# Patient Record
Sex: Female | Born: 1935
Health system: Southern US, Community
[De-identification: ages and names within clinical notes are randomized; demographics above are authoritative.]

## PROBLEM LIST (undated history)

## (undated) DIAGNOSIS — R232 Flushing: Secondary | ICD-10-CM

## (undated) DIAGNOSIS — J189 Pneumonia, unspecified organism: Secondary | ICD-10-CM

## (undated) DIAGNOSIS — Z9289 Personal history of other medical treatment: Secondary | ICD-10-CM

## (undated) DIAGNOSIS — G8929 Other chronic pain: Secondary | ICD-10-CM

## (undated) DIAGNOSIS — R011 Cardiac murmur, unspecified: Secondary | ICD-10-CM

## (undated) DIAGNOSIS — B019 Varicella without complication: Secondary | ICD-10-CM

## (undated) DIAGNOSIS — I5030 Unspecified diastolic (congestive) heart failure: Secondary | ICD-10-CM

## (undated) DIAGNOSIS — M545 Low back pain: Secondary | ICD-10-CM

## (undated) DIAGNOSIS — E049 Nontoxic goiter, unspecified: Secondary | ICD-10-CM

## (undated) DIAGNOSIS — I509 Heart failure, unspecified: Secondary | ICD-10-CM

## (undated) DIAGNOSIS — N952 Postmenopausal atrophic vaginitis: Secondary | ICD-10-CM

## (undated) DIAGNOSIS — G5600 Carpal tunnel syndrome, unspecified upper limb: Secondary | ICD-10-CM

## (undated) DIAGNOSIS — E669 Obesity, unspecified: Secondary | ICD-10-CM

## (undated) DIAGNOSIS — M1990 Unspecified osteoarthritis, unspecified site: Secondary | ICD-10-CM

## (undated) DIAGNOSIS — E785 Hyperlipidemia, unspecified: Secondary | ICD-10-CM

## (undated) DIAGNOSIS — E213 Hyperparathyroidism, unspecified: Secondary | ICD-10-CM

## (undated) DIAGNOSIS — N1832 Chronic kidney disease, stage 3b: Secondary | ICD-10-CM

## (undated) DIAGNOSIS — I10 Essential (primary) hypertension: Secondary | ICD-10-CM

## (undated) DIAGNOSIS — N950 Postmenopausal bleeding: Secondary | ICD-10-CM

## (undated) DIAGNOSIS — I350 Nonrheumatic aortic (valve) stenosis: Secondary | ICD-10-CM

## (undated) DIAGNOSIS — D649 Anemia, unspecified: Secondary | ICD-10-CM

## (undated) DIAGNOSIS — I358 Other nonrheumatic aortic valve disorders: Secondary | ICD-10-CM

## (undated) DIAGNOSIS — G629 Polyneuropathy, unspecified: Secondary | ICD-10-CM

## (undated) DIAGNOSIS — I341 Nonrheumatic mitral (valve) prolapse: Secondary | ICD-10-CM

## (undated) DIAGNOSIS — N289 Disorder of kidney and ureter, unspecified: Secondary | ICD-10-CM

## (undated) HISTORY — DX: Personal history of other medical treatment: Z92.89

## (undated) HISTORY — DX: Essential (primary) hypertension: I10

## (undated) HISTORY — DX: Postmenopausal bleeding: N95.0

## (undated) HISTORY — PX: KNEE SURGERY: SHX244

## (undated) HISTORY — DX: Carpal tunnel syndrome, unspecified upper limb: G56.00

## (undated) HISTORY — DX: Cardiac murmur, unspecified: R01.1

## (undated) HISTORY — DX: Hyperparathyroidism, unspecified: E21.3

## (undated) HISTORY — DX: Disorder of kidney and ureter, unspecified: N28.9

## (undated) HISTORY — DX: Postmenopausal atrophic vaginitis: N95.2

## (undated) HISTORY — DX: Flushing: R23.2

## (undated) HISTORY — PX: COLONOSCOPY: SHX174

## (undated) HISTORY — DX: Varicella without complication: B01.9

## (undated) HISTORY — DX: Nontoxic goiter, unspecified: E04.9

## (undated) HISTORY — DX: Unspecified osteoarthritis, unspecified site: M19.90

## (undated) HISTORY — DX: Other nonrheumatic aortic valve disorders: I35.8

## (undated) HISTORY — DX: Other chronic pain: G89.29

## (undated) HISTORY — DX: Low back pain: M54.5

## (undated) HISTORY — PX: SHOULDER SURGERY: SHX246

## (undated) HISTORY — DX: Hyperlipidemia, unspecified: E78.5

## (undated) HISTORY — DX: Obesity, unspecified: E66.9

## (undated) HISTORY — DX: Nonrheumatic mitral (valve) prolapse: I34.1

---

## 1973-11-23 HISTORY — PX: DILATION AND CURETTAGE OF UTERUS: SHX78

## 1974-11-23 HISTORY — PX: TUBAL LIGATION: SHX77

## 1989-11-23 HISTORY — PX: CHOLECYSTECTOMY: SHX55

## 1995-11-24 HISTORY — PX: HEMORRHOID SURGERY: SHX153

## 1998-11-23 HISTORY — PX: REPLACEMENT TOTAL KNEE: SUR1224

## 2000-11-17 ENCOUNTER — Encounter: Payer: Self-pay | Admitting: Family Medicine

## 2000-11-17 ENCOUNTER — Ambulatory Visit (HOSPITAL_COMMUNITY): Admission: RE | Admit: 2000-11-17 | Discharge: 2000-11-17 | Payer: Self-pay | Admitting: Family Medicine

## 2000-12-20 ENCOUNTER — Encounter: Admission: RE | Admit: 2000-12-20 | Discharge: 2001-01-14 | Payer: Self-pay | Admitting: Neurosurgery

## 2000-12-21 ENCOUNTER — Other Ambulatory Visit: Admission: RE | Admit: 2000-12-21 | Discharge: 2000-12-21 | Payer: Self-pay | Admitting: Obstetrics and Gynecology

## 2001-01-03 ENCOUNTER — Ambulatory Visit (HOSPITAL_COMMUNITY): Admission: RE | Admit: 2001-01-03 | Discharge: 2001-01-03 | Payer: Self-pay | Admitting: Obstetrics and Gynecology

## 2001-01-03 ENCOUNTER — Encounter: Payer: Self-pay | Admitting: Obstetrics and Gynecology

## 2001-07-13 ENCOUNTER — Encounter: Admission: RE | Admit: 2001-07-13 | Discharge: 2001-10-11 | Payer: Self-pay | Admitting: Internal Medicine

## 2001-09-07 DIAGNOSIS — E119 Type 2 diabetes mellitus without complications: Secondary | ICD-10-CM | POA: Insufficient documentation

## 2002-01-12 ENCOUNTER — Other Ambulatory Visit: Admission: RE | Admit: 2002-01-12 | Discharge: 2002-01-12 | Payer: Self-pay | Admitting: Obstetrics and Gynecology

## 2002-01-12 DIAGNOSIS — N952 Postmenopausal atrophic vaginitis: Secondary | ICD-10-CM | POA: Insufficient documentation

## 2002-01-17 ENCOUNTER — Encounter: Payer: Self-pay | Admitting: Obstetrics and Gynecology

## 2002-01-17 ENCOUNTER — Ambulatory Visit (HOSPITAL_COMMUNITY): Admission: RE | Admit: 2002-01-17 | Discharge: 2002-01-17 | Payer: Self-pay | Admitting: Obstetrics and Gynecology

## 2002-02-01 ENCOUNTER — Encounter: Admission: RE | Admit: 2002-02-01 | Discharge: 2002-05-02 | Payer: Self-pay | Admitting: Internal Medicine

## 2002-05-31 ENCOUNTER — Encounter: Admission: RE | Admit: 2002-05-31 | Discharge: 2002-08-29 | Payer: Self-pay | Admitting: Internal Medicine

## 2002-10-30 ENCOUNTER — Encounter: Admission: RE | Admit: 2002-10-30 | Discharge: 2003-01-28 | Payer: Self-pay | Admitting: Neurosurgery

## 2002-12-13 ENCOUNTER — Encounter: Admission: RE | Admit: 2002-12-13 | Discharge: 2003-03-13 | Payer: Self-pay | Admitting: Internal Medicine

## 2003-01-31 ENCOUNTER — Ambulatory Visit (HOSPITAL_COMMUNITY): Admission: RE | Admit: 2003-01-31 | Discharge: 2003-01-31 | Payer: Self-pay | Admitting: Obstetrics and Gynecology

## 2003-01-31 ENCOUNTER — Encounter: Payer: Self-pay | Admitting: Obstetrics and Gynecology

## 2003-07-09 ENCOUNTER — Encounter: Admission: RE | Admit: 2003-07-09 | Discharge: 2003-09-13 | Payer: Self-pay | Admitting: Internal Medicine

## 2003-09-12 ENCOUNTER — Encounter (INDEPENDENT_AMBULATORY_CARE_PROVIDER_SITE_OTHER): Payer: Self-pay | Admitting: Specialist

## 2003-09-12 ENCOUNTER — Ambulatory Visit (HOSPITAL_COMMUNITY): Admission: RE | Admit: 2003-09-12 | Discharge: 2003-09-12 | Payer: Self-pay | Admitting: Gastroenterology

## 2003-10-31 ENCOUNTER — Inpatient Hospital Stay (HOSPITAL_COMMUNITY): Admission: RE | Admit: 2003-10-31 | Discharge: 2003-11-02 | Payer: Self-pay | Admitting: Orthopedic Surgery

## 2004-02-27 ENCOUNTER — Ambulatory Visit (HOSPITAL_COMMUNITY): Admission: RE | Admit: 2004-02-27 | Discharge: 2004-02-27 | Payer: Self-pay | Admitting: Obstetrics and Gynecology

## 2005-03-16 ENCOUNTER — Other Ambulatory Visit: Admission: RE | Admit: 2005-03-16 | Discharge: 2005-03-16 | Payer: Self-pay | Admitting: Obstetrics and Gynecology

## 2005-03-30 ENCOUNTER — Ambulatory Visit (HOSPITAL_COMMUNITY): Admission: RE | Admit: 2005-03-30 | Discharge: 2005-03-30 | Payer: Self-pay | Admitting: Obstetrics and Gynecology

## 2006-04-14 ENCOUNTER — Ambulatory Visit (HOSPITAL_COMMUNITY): Admission: RE | Admit: 2006-04-14 | Discharge: 2006-04-14 | Payer: Self-pay | Admitting: Obstetrics and Gynecology

## 2006-05-05 ENCOUNTER — Encounter: Admission: RE | Admit: 2006-05-05 | Discharge: 2006-05-05 | Payer: Self-pay | Admitting: Obstetrics and Gynecology

## 2007-04-13 ENCOUNTER — Encounter: Admission: RE | Admit: 2007-04-13 | Discharge: 2007-04-13 | Payer: Self-pay | Admitting: Obstetrics and Gynecology

## 2008-04-13 ENCOUNTER — Encounter: Admission: RE | Admit: 2008-04-13 | Discharge: 2008-04-13 | Payer: Self-pay | Admitting: Obstetrics and Gynecology

## 2009-01-01 DIAGNOSIS — N950 Postmenopausal bleeding: Secondary | ICD-10-CM | POA: Insufficient documentation

## 2009-02-11 DIAGNOSIS — N840 Polyp of corpus uteri: Secondary | ICD-10-CM | POA: Insufficient documentation

## 2009-02-28 ENCOUNTER — Encounter (INDEPENDENT_AMBULATORY_CARE_PROVIDER_SITE_OTHER): Payer: Self-pay | Admitting: Obstetrics and Gynecology

## 2009-02-28 ENCOUNTER — Ambulatory Visit (HOSPITAL_COMMUNITY): Admission: RE | Admit: 2009-02-28 | Discharge: 2009-02-28 | Payer: Self-pay | Admitting: Obstetrics and Gynecology

## 2009-02-28 DIAGNOSIS — N950 Postmenopausal bleeding: Secondary | ICD-10-CM

## 2009-02-28 HISTORY — DX: Postmenopausal bleeding: N95.0

## 2009-04-17 ENCOUNTER — Encounter: Admission: RE | Admit: 2009-04-17 | Discharge: 2009-04-17 | Payer: Self-pay | Admitting: Obstetrics and Gynecology

## 2009-05-02 ENCOUNTER — Ambulatory Visit (HOSPITAL_COMMUNITY): Admission: RE | Admit: 2009-05-02 | Discharge: 2009-05-03 | Payer: Self-pay | Admitting: Orthopedic Surgery

## 2010-02-06 DIAGNOSIS — M1990 Unspecified osteoarthritis, unspecified site: Secondary | ICD-10-CM | POA: Insufficient documentation

## 2010-02-06 DIAGNOSIS — N951 Menopausal and female climacteric states: Secondary | ICD-10-CM | POA: Insufficient documentation

## 2010-04-18 ENCOUNTER — Encounter: Admission: RE | Admit: 2010-04-18 | Discharge: 2010-04-18 | Payer: Self-pay | Admitting: Obstetrics and Gynecology

## 2010-06-23 DIAGNOSIS — Z9289 Personal history of other medical treatment: Secondary | ICD-10-CM

## 2010-06-23 HISTORY — DX: Personal history of other medical treatment: Z92.89

## 2010-07-24 HISTORY — PX: TRANSTHORACIC ECHOCARDIOGRAM: SHX275

## 2010-10-27 ENCOUNTER — Inpatient Hospital Stay (HOSPITAL_COMMUNITY)
Admission: RE | Admit: 2010-10-27 | Discharge: 2010-10-30 | Payer: Self-pay | Source: Home / Self Care | Attending: Orthopedic Surgery | Admitting: Orthopedic Surgery

## 2011-02-02 LAB — CBC
HCT: 28.1 % — ABNORMAL LOW (ref 36.0–46.0)
Hemoglobin: 8.4 g/dL — ABNORMAL LOW (ref 12.0–15.0)
Hemoglobin: 9.8 g/dL — ABNORMAL LOW (ref 12.0–15.0)
MCH: 33.3 pg (ref 26.0–34.0)
MCH: 33.6 pg (ref 26.0–34.0)
MCHC: 34.4 g/dL (ref 30.0–36.0)
MCHC: 34.9 g/dL (ref 30.0–36.0)
MCHC: 35.1 g/dL (ref 30.0–36.0)
Platelets: 138 10*3/uL — ABNORMAL LOW (ref 150–400)
Platelets: 149 10*3/uL — ABNORMAL LOW (ref 150–400)
RBC: 2.58 MIL/uL — ABNORMAL LOW (ref 3.87–5.11)
RBC: 2.91 MIL/uL — ABNORMAL LOW (ref 3.87–5.11)
RDW: 12.5 % (ref 11.5–15.5)
WBC: 12.5 10*3/uL — ABNORMAL HIGH (ref 4.0–10.5)
WBC: 15.5 10*3/uL — ABNORMAL HIGH (ref 4.0–10.5)

## 2011-02-02 LAB — PROTIME-INR
INR: 1.39 (ref 0.00–1.49)
Prothrombin Time: 19.4 seconds — ABNORMAL HIGH (ref 11.6–15.2)

## 2011-02-02 LAB — GLUCOSE, CAPILLARY
Glucose-Capillary: 156 mg/dL — ABNORMAL HIGH (ref 70–99)
Glucose-Capillary: 159 mg/dL — ABNORMAL HIGH (ref 70–99)
Glucose-Capillary: 171 mg/dL — ABNORMAL HIGH (ref 70–99)
Glucose-Capillary: 172 mg/dL — ABNORMAL HIGH (ref 70–99)
Glucose-Capillary: 172 mg/dL — ABNORMAL HIGH (ref 70–99)
Glucose-Capillary: 173 mg/dL — ABNORMAL HIGH (ref 70–99)
Glucose-Capillary: 182 mg/dL — ABNORMAL HIGH (ref 70–99)
Glucose-Capillary: 192 mg/dL — ABNORMAL HIGH (ref 70–99)

## 2011-02-02 LAB — TYPE AND SCREEN
ABO/RH(D): A POS
Antibody Screen: NEGATIVE

## 2011-02-02 LAB — BASIC METABOLIC PANEL
BUN: 12 mg/dL (ref 6–23)
BUN: 14 mg/dL (ref 6–23)
BUN: 15 mg/dL (ref 6–23)
CO2: 25 mEq/L (ref 19–32)
Calcium: 9.1 mg/dL (ref 8.4–10.5)
Calcium: 9.2 mg/dL (ref 8.4–10.5)
Chloride: 103 mEq/L (ref 96–112)
Creatinine, Ser: 1.25 mg/dL — ABNORMAL HIGH (ref 0.4–1.2)
Creatinine, Ser: 1.26 mg/dL — ABNORMAL HIGH (ref 0.4–1.2)
GFR calc Af Amer: 57 mL/min — ABNORMAL LOW (ref >60)
GFR calc non Af Amer: 42 mL/min — ABNORMAL LOW (ref >60)
Glucose, Bld: 185 mg/dL — ABNORMAL HIGH (ref 70–99)
Potassium: 4.2 mEq/L (ref 3.5–5.1)

## 2011-02-02 LAB — ABO/RH: ABO/RH(D): A POS

## 2011-02-03 LAB — CBC
MCH: 33.8 pg (ref 26.0–34.0)
MCHC: 34.9 g/dL (ref 30.0–36.0)
MCV: 96.7 fL (ref 78.0–100.0)
Platelets: 236 10*3/uL (ref 150–400)
RDW: 12.3 % (ref 11.5–15.5)
WBC: 9.1 10*3/uL (ref 4.0–10.5)

## 2011-02-03 LAB — COMPREHENSIVE METABOLIC PANEL
Albumin: 4.1 g/dL (ref 3.5–5.2)
BUN: 24 mg/dL — ABNORMAL HIGH (ref 6–23)
Calcium: 10.2 mg/dL (ref 8.4–10.5)
Creatinine, Ser: 1.52 mg/dL — ABNORMAL HIGH (ref 0.4–1.2)
Potassium: 4.4 mEq/L (ref 3.5–5.1)
Total Protein: 8.1 g/dL (ref 6.0–8.3)

## 2011-02-03 LAB — URINALYSIS, ROUTINE W REFLEX MICROSCOPIC
Specific Gravity, Urine: 1.01 (ref 1.005–1.030)
Urobilinogen, UA: 0.2 mg/dL (ref 0.0–1.0)

## 2011-02-03 LAB — URINE MICROSCOPIC-ADD ON

## 2011-02-03 LAB — PROTIME-INR: INR: 0.95 (ref 0.00–1.49)

## 2011-02-03 LAB — SURGICAL PCR SCREEN: Staphylococcus aureus: NEGATIVE

## 2011-02-03 LAB — APTT: aPTT: 29 seconds (ref 24–37)

## 2011-03-02 LAB — URINALYSIS, ROUTINE W REFLEX MICROSCOPIC
Glucose, UA: NEGATIVE mg/dL
Hgb urine dipstick: NEGATIVE
Protein, ur: NEGATIVE mg/dL
pH: 5 (ref 5.0–8.0)

## 2011-03-02 LAB — PROTIME-INR: Prothrombin Time: 12.4 seconds (ref 11.6–15.2)

## 2011-03-02 LAB — GLUCOSE, CAPILLARY
Glucose-Capillary: 100 mg/dL — ABNORMAL HIGH (ref 70–99)
Glucose-Capillary: 108 mg/dL — ABNORMAL HIGH (ref 70–99)
Glucose-Capillary: 137 mg/dL — ABNORMAL HIGH (ref 70–99)
Glucose-Capillary: 138 mg/dL — ABNORMAL HIGH (ref 70–99)

## 2011-03-02 LAB — COMPREHENSIVE METABOLIC PANEL
ALT: 20 U/L (ref 0–35)
AST: 26 U/L (ref 0–37)
Alkaline Phosphatase: 49 U/L (ref 39–117)
CO2: 27 mEq/L (ref 19–32)
GFR calc Af Amer: 54 mL/min — ABNORMAL LOW (ref >60)
GFR calc non Af Amer: 45 mL/min — ABNORMAL LOW (ref >60)
Glucose, Bld: 94 mg/dL (ref 70–99)
Potassium: 4.3 mEq/L (ref 3.5–5.1)
Sodium: 140 mEq/L (ref 135–145)
Total Protein: 7.3 g/dL (ref 6.0–8.3)

## 2011-03-02 LAB — CBC
Hemoglobin: 13.2 g/dL (ref 12.0–15.0)
RBC: 3.87 MIL/uL (ref 3.87–5.11)
WBC: 10.1 10*3/uL (ref 4.0–10.5)

## 2011-03-04 LAB — CBC
HCT: 39.6 % (ref 36.0–46.0)
Platelets: 214 10*3/uL (ref 150–400)
RDW: 12.3 % (ref 11.5–15.5)

## 2011-03-04 LAB — BASIC METABOLIC PANEL
BUN: 19 mg/dL (ref 6–23)
CO2: 28 mEq/L (ref 19–32)
Chloride: 103 mEq/L (ref 96–112)
Creatinine, Ser: 1.19 mg/dL (ref 0.4–1.2)
Glucose, Bld: 99 mg/dL (ref 70–99)
Potassium: 4.3 mEq/L (ref 3.5–5.1)

## 2011-03-17 ENCOUNTER — Other Ambulatory Visit: Payer: Self-pay | Admitting: Obstetrics and Gynecology

## 2011-03-17 DIAGNOSIS — Z1231 Encounter for screening mammogram for malignant neoplasm of breast: Secondary | ICD-10-CM

## 2011-04-07 NOTE — Op Note (Signed)
NAMENEVEYAH, GARZON               ACCOUNT NO.:  [id number]   MEDICAL RECORD NO.:  [id number]          PATIENT TYPE:  AMB   LOCATION:  SDC                           FACILITY:  [facility]   PHYSICIAN:  Hal Morales, M.D.DATE OF BIRTH:  May 28, 1934   DATE OF PROCEDURE:  02/28/2009  DATE OF DISCHARGE:                               OPERATIVE REPORT   PREOPERATIVE DIAGNOSES:  Postmenopausal bleeding, endometrial polyp.   POSTOPERATIVE DIAGNOSES:  Postmenopausal bleeding, endometrial polyp.   OPERATION:  Hysteroscopy, dilation and curettage, removal of endometrial  polyp.   SURGEON:  Vanessa P. Haygood, MD   ANESTHESIA:  General orotracheal.   ESTIMATED BLOOD LOSS:  Less than 10 mL.   COMPLICATIONS:  None.   FINDINGS:  There was a 1-cm endometrial polyp on the posterior fundus of  the uterus near the tubal ostium.  The remainder of the endometrium was  atrophic.   PROCEDURE:  The patient was taken to the operating room after  appropriate identification and placed on the operating table.  After the  attainment of adequate general anesthesia, she was placed in the  lithotomy position.  The perineum and vagina were prepped with multiple  layers of Betadine and a red Robinson catheter used to empty the  bladder.  The perineum was draped as a sterile field.  A Graves speculum  was placed in the vagina and a paracervical block achieved with a total  of 10 mL of 2% Xylocaine in the 5 and 7 o'clock positions.  The cervix  was grasped with a tenaculum and the uterus sounded to 9 cm.  The cervix  was then dilated to accommodate the diagnostic hysteroscope and this was  used to evaluate the endometrial cavity with the above-noted findings.  These findings were documented and the Randall stone forceps used to  remove the 1 endometrial polyp that had been noted.  This was documented  as removed with hysteroscopy and the hysteroscope removed and the entire  cavity curetted.  The endometrial  curettings were removed from the  operative field and the hysteroscope inserted once again to document  that the entire uterine cavity was clear of any lesions.  All  instruments were then removed from the vagina and the patient awakened  from general anesthesia and taken to the recovery room in satisfactory  condition having tolerated the procedure well with sponge and instrument  counts correct.  Prior to emergence from anesthesia, the patient was  given Toradol 30 mg IV and 30 mg IM.   DISCHARGE INSTRUCTIONS:  Printed instructions for [facility] from the [facility].   DISCHARGE MEDICATIONS:  The patient's admission medications except she  is to take no aspirin until March 01, 2009.   FOLLOWUP:  The patient has a 2-week followup with Dr. Pennie Rushing.      Hal Morales, M.D.  Electronically Signed     VPH/MEDQ  D:  02/28/2009  T:  02/28/2009  Job:  [id number]

## 2011-04-07 NOTE — H&P (Signed)
NAMECLARKE, Bonnie Chavez               ACCOUNT NO.:  [id number]   MEDICAL RECORD NO.:  [id number]          PATIENT TYPE:  AMB   LOCATION:                                FACILITY:  [facility]   PHYSICIAN:  Hal Morales, M.D.DATE OF BIRTH:  1934-08-08   DATE OF ADMISSION:  02/28/2009  DATE OF DISCHARGE:                              HISTORY & PHYSICAL   HISTORY OF PRESENT ILLNESS:  The patient is a 77 year old black female  para 3-0-1-2 who presents for evaluation of postmenopausal bleeding.  The patient has had an episode of postmenopausal bleeding that occurred  in February without warning.  She had had no menses since 1996.  She had  a normal hemoglobin and TSH in January of 2010.  She had mild abdominal  pain with dull crampiness as well as some flatulence but no  constipation, diarrhea, rectal bleeding, vomiting, nausea.  She has used  CombiPatch for hormone replacement therapy for function since 2003.  She  has tried on several occasions to discontinue the CombiPatch but each  time has had disruptive vasomotor symptomatology.   She had evaluation of her postmenopausal bleeding with an endometrial  biopsy performed on January 02, 2009, showing scant fragments of benign  cuboidal epithelium consistent with endometrial lining.  She underwent a  transvaginal ultrasound to determine the endometrial thickness and this  was noted to be thickened with the possibility of two hyperechoic masses  measuring 0.5 cm in each.  The endometrial thickness was measured at 7.8  mm.  In light of these findings, it is recommended that the patient  undergo hysteroscopy D and C and removal of any endometrial polyps.   PAST MEDICAL HISTORY:  The patient has a history of mitral valve  prolapse but does not require antibiotic prophylaxis.  At this point she  also has chronic hypertension, hyperlipidemia and type 2 diabetes.   FAMILY HISTORY:  Is positive for cerebrovascular accident, chronic  hypertension,  diabetes and heart disease.   CURRENT MEDICATIONS:  1. Zestril 40 mg daily.  2. Norvasc 10 mg daily.  3. Furosemide 20 mg daily.  4. Zetia 10 mg daily.  5. Crestor 20 mg daily.  6. Januvia 100 mg daily.  7. CombiPatch 0.5 over 0.14 two times per week.  8. Bystolic 5 mg daily.  9. Vitamin D 1000 international units twice daily.  10.Tylenol Extra Strength 1000 mg twice daily.  11.Vitamin D 400 international units daily.  12.Caltrate 600 mg twice daily.  13.Multivitamins one p.o. daily.  14.CoQ10 100 mg twice daily.  15.Aspirin 81 mg daily.   DRUG ALLERGIES:  PENICILLIN.   REVIEW OF SYSTEMS:  Is positive for joint pain secondary to degenerative  joint disease and the aforementioned postmenopausal bleeding which has  not recurred.   PHYSICAL EXAMINATION:  GENERAL:  The patient is an obese black female in  no acute distress.  VITAL SIGNS:  Blood pressure is 110/60.  LUNGS:  Clear.  HEART:  Regular rate and rhythm.  ABDOMEN:  The abdomen is soft without masses or organomegaly.  PELVIC:  EG, BUS is within normal  limits.  The vagina is atrophic.  The  cervix is without gross lesions.  The uterus is upper limits of normal  size and sounds to 10 cm.  The adnexa without masses.  Rectovaginal no  masses.   IMPRESSION:  1. Postmenopausal bleeding, question secondary to hormone replacement      therapy.  2. Increased endometrial thickness with possible endometrial masses.  3. Chronic hypertension.  4. Hyperlipidemia.  5. Type 2 diabetes.   DISPOSITION:  A discussion was held with the patient with the  recommendation that she undergo hysteroscopy with resection of  endometrial masses and curettage.  The risks of anesthesia, bleeding,  infection, damage to adjacent organs and uterine perforation have been  reviewed with her and she wishes to proceed.  This will be done at  [facility] on February 28, 2009.      Hal Morales, M.D.  Electronically Signed     VPH/MEDQ  D:   02/27/2009  T:  02/27/2009  Job:  [id number]

## 2011-04-07 NOTE — Op Note (Signed)
Bonnie Chavez, Bonnie Chavez               ACCOUNT NO.:  [id number]   MEDICAL RECORD NO.:  [id number]          PATIENT TYPE:  OIB   LOCATION:  5034                         FACILITY:  [facility]   PHYSICIAN:  Vania Rea. Supple, M.D.  DATE OF BIRTH:  04-08-1934   DATE OF PROCEDURE:  05/02/2009  DATE OF DISCHARGE:                               OPERATIVE REPORT   PREOPERATIVE DIAGNOSIS:  End-stage right shoulder osteoarthrosis.   POSTOPERATIVE DIAGNOSIS:  End-stage right shoulder osteoarthrosis.   PROCEDURE:  A right total shoulder arthroplasty utilizing a size 10  Press-Fit DePuy global stem, a cemented 40 pegged glenoid and a 44 x 18  humeral head.   SURGEON:  Vania Rea. Supple, MD   ASSISTANT:  Lucita Lora. Shuford, PA-C.   ANESTHESIA:  General endotracheal as well as an interscalene block.   ESTIMATED BLOOD LOSS:  250 mL.   DRAINS:  None.   HISTORY:  [name] is a 77 year old female who has had chronic right  shoulder pain with weakness and restrictions in mobility and examination  showing a painful limited arc of motion with radiographs confirming end-  stage arthrosis.  Due to ongoing pain and functional limitations, she is  brought to the operative room at this time for planned right total  shoulder arthroplasty.   Preoperatively counseled [name] regarding treatment options as well  as risks versus benefits thereof.  Possible surgical complications  bleeding, infection, neurovascular injury, persistent loss of motion and  possible need for revision surgery, and potential for anesthetic  complication were reviewed.  She understands and accepts and agrees with  a planned procedure.   PROCEDURE IN DETAIL:  After undergoing routine preop evaluation, the  patient received prophylactic antibiotics and an interscalene block was  established in the holding area by the Anesthesia department.  Placed  supine on the op table and underwent smooth induction of a general  endotracheal  anesthesia.  Placed into a gentle beach-chair position and  appropriately padded and protected.  The right shoulder girdle region  was sterilely prepped and draped in standard fashion.  Time-out was  called.  Any anterior approach to the right shoulder was made through a  15 cm incision beginning at the coracoid process and extending laterally  and distally.  Skin flaps were elevated and  electrocautery was used for  hemostasis.  The deltopectoral interval was identified and developed and  then the cephalic vein was retracted laterally.  Deep dissection carried  down to the anterior shoulder with the conjoined tendon being retracted  medially and the upper margin of the pec major tendon tenotomized for 1  cm.  The bicipital groove was identified and this was opened and then  the subscapularis was elevated away from the lesser tuberosity with  electrocautery and the free margin of the subscapularis was then tacked  with a series of #2 FiberWire sutures.  Biceps tendon was tenotomized.  Proximal stump was removed.  The humeral head was then delivered through  the wound and the osteophytes removed from the anterior and inferior  aspects of the humeral head.  Capsule was  released inferiorly allowing  complete exposure of the humeral head.  We then used the extramedullary  guide to create the osteotomy of the humeral head to appropriate level  with care taken to protect the rotator cuff.  Hand reamings were then  used to gain access to the humeral medullary canal and reaming was  performed up to size 10.  We then performed sequential broachings up to  size 10 which showed good fit.  We then turned our attention to the  glenoid.  The labrum was removed circumferentially with combination of  sharp dissection and electrocautery.  Good exposure was achieved.  We  then performed reaming with the size 40 reamer since this had the most  appropriate coverage of the glenoid.  The stabilizing drill holes  were  then placed.  Trial reduction was performed showing excellent fit and  coverage.  We then meticulously cleaned the glenoid.  Cement was mixed  and introduced into the peripheral drill holes and then the implant was  impacted into position.  Following this the proximal humerus was again  delivered through the wound.  The trial implant was removed.  The final  implant was then placed into the humerus and we did supplement bone  graft from the resected humeral head to supplement the metaphyseal  portion of the bone.  The implant was then impacted into the proper  level obtaining excellent fit.  Trial reduction was performed and the 48  x 18 head had excellent fit.  There was good soft tissue balance and  appropriate 50% translation posteriorly across the glenoid.  A trial was  then removed.  The Morse taper was meticulously cleaned and then the  final 48 x 18 head was impacted into position.  Final reduction was  performed.  Excellent shoulder mobility and good stability was achieved.  We then repaired the subscapularis to the lesser tuberosity through a  series of bone tunnels using the #2 FiberWires.  The rotator interval  was then closed with a pair of figure-of-eight #2 FiberWire sutures.  We  then performed a biceps tenodesis with the FiberWire anteriorly.  Final  inspection and irrigation was completed.  I should mention that we did  remove a large osteochondral loose body from the subscapularis recess  prior to closure and we also performed a complete release of the  subscapularis so that it was fully mobilized prior to the repair to the  lesser tuberosity.  At the completion, hemostasis was obtained.  Final  irrigation was completed.  The deltopectoral interval was allowed to  close and was reapproximated with a single #2 FiberWire suture.  2-0  Vicryl was used to close the subcu layer and intracuticular 3-0 Monocryl  for the skin followed by Steri-Strips.  Dry dressing placed  over the  right shoulder and right arm was placed in a sling immobilizer.  The  patient was supine, extubated and taken to the recovery room in stable  addition.      Vania Rea. Supple, M.D.  Electronically Signed     Vania Rea. Supple, M.D.  Electronically Signed    KMS/MEDQ  D:  05/02/2009  T:  05/03/2009  Job:  [id number]

## 2011-04-10 NOTE — Op Note (Signed)
NAME:  Chavez, Bonnie                         ACCOUNT NO.:  [id number]   MEDICAL RECORD NO.:  [id number]                   PATIENT TYPE:  INP   LOCATION:  0479                                 [facility]ACILITY:  [facility]   PHYSICIAN:  Ollen Chavez, M.D.                 DATE OF BIRTH:  [birth year]   DATE OF PROCEDURE:  10/31/2003  DATE OF DISCHARGE:                                 OPERATIVE REPORT   PREOPERATIVE DIAGNOSIS:  Osteoarthritis, left shoulder.   POSTOPERATIVE DIAGNOSIS:  Osteoarthritis, left shoulder.   PROCEDURE:  Left shoulder hemiarthroplasty.   SURGEON:  Ollen Chavez, M.D.   ASSISTANT:  Clarene Reamer, P.A.-C.   ANESTHESIA:  General and interscalene.   ESTIMATED BLOOD LOSS:  300.   DRAINS:  None.   COMPLICATIONS:  None.   CONDITION:  Stable to recovery room.   BRIEF CLINICAL NOTE:  Bonnie Chavez is a 77 year old female with end-stage  arthritis of the left shoulder, collapse of the humeral head, and bone-on-  bone change and large osteophytes.  She has had pain refractory to  nonoperative management and presents now for left shoulder hemiarthroplasty.  We were considering total shoulder arthroplasty depending on the status of  the glenoid.   PROCEDURE IN DETAIL:  After the successful administration of interscalene  block and general anesthetic, the patient is placed on the operating table  with her knees flexed and trunk also flexed about 30 degrees.  The left  upper extremity and shoulder girdle are isolated from her trunk with plastic  drapes and prepped and draped in the usual sterile fashion.  Incision line  is drawn from the coracoid process down toward the humeral shaft.  The skin  is cut with a 10 blade through subcutaneous tissue to the level of the  deltoid fascia.  We found the deltopectoral interval and dissected the soft  tissue over the cephalic vein and then retracted the vein laterally with the  pectoralis muscle.  I then removed the superior one-third  of the pectoralis  tendon from the humerus in order to gain better exposure.  The clavipectoral  fascia is then dissected to reveal the conjoined tendon.  Short retractors  placed on the conjoined tendon so as to not go deep and disturb the  musculocutaneous nerve.  The subscapularis is then visualized and the  vertical incision made in the subscapularis, and the medial border of the  tendon is tagged with sutures.  We then dislocated the shoulder.  She had  severe flattening of the humeral head with large osteophyte formation  inferior and posterior.  I placed the template for the humeral prosthesis  and marked the osteotomy line.  We then externally rotated the arm  approximately 30-35 degrees and made the humeral cut to give about 30-35  degrees of retroversion.  We then removed all of the osteophytes off the  inferior border of the cut.  The  glenoid is then inspected and,  surprisingly, the glenoid surface does not have any significant wear.  There  was some chondromalacia, but there was still some cartilage present; thus,  it was felt that we were going to leave the glenoid alone.  The canal  reaming is then performed up to a size 10.  We were only able to broach up  to an 8, as the 10 would not go down all the way.  We then placed the size 8  global humeral component.  This was rotationally unstable, and thus I had to  cement it in.  We placed a size 2 cement restrictor at the appropriate depth  in the canal, irrigated the canal, mixed the cement, and then injected it  into the canal.  Then placed the size 2 global humeral stem in the  appropriate retroversion. Once the cement was fully hardened, we trialled  the head.  The diameter was 48 mm off the resected head.  We tried a 48 x 15  first, but it did not provide enough offset.  We then went with a 48 x 18  and this led to appropriate offset and tension.  We could translate her  posteriorly about 50%, inferiorly about 50%.  I took  out the trial and  placed the permanent 48 x 18 global head.  The shoulder was reduced and the  wound copiously irrigated with antibiotic solution.  The subscapularis was  then closed with the Ethibond suture.  Further irrigated and then closed the  deltopectoral fascia with 0 Vicryl, subcu with interrupted 2-0 Vicryl, and  subcuticular running 4-0 Monocryl.  The incision was cleaned and dried and  Steri-Strips and a bulky sterile dressing applied.  The patient then placed  in a shoulder immobilizer, awakened and transported to recovery in stable  condition.                                               Ollen Chavez, M.D.    FA/MEDQ  D:  10/31/2003  T:  11/01/2003  Job:  [id number]

## 2011-04-10 NOTE — H&P (Signed)
NAME:  Bonnie Chavez, Bonnie Chavez                         ACCOUNT NO.:  [id number]   MEDICAL RECORD NO.:  [id number]                   PATIENT TYPE:  INP   LOCATION:  NA                                   FACILITY:  [facility]   PHYSICIAN:  Ollen Gross, M.D.                 DATE OF BIRTH:  1934/02/14   DATE OF ADMISSION:  10/31/2003  DATE OF DISCHARGE:                                HISTORY & PHYSICAL   CHIEF COMPLAINT:  Left shoulder pain.   HISTORY OF PRESENT ILLNESS:  The patient is a 77 year old female who has had  ongoing left shoulder pain. She is a right-hand dominant female who has been  seen by Dr. Lequita Halt for osteoarthritis of the shoulder. She has had some  neck and knee problems in the past along with her left shoulder. She has  been evaluated, worked up, and x-rays in the office show that she has severe  end-stage osteoarthritis of the left shoulder with large inferior  glenohumeral osteophyte formation. She does have limited activity and she is  having a lot of pain at night. The patient has been limiting her motion and  function. She is seen in evaluation and felt that she has reached the point  where she could benefit from undergoing hemiarthroplasty versus total  shoulder arthroplasty of the left shoulder. The risks and benefits have been  discussed with the patient, and she elects to proceed with surgery.   ALLERGIES:  PENICILLIN causes rash and itching.   CURRENT MEDICATIONS:  1. Hydrochlorothiazide 25 mg.  2. Lisinopril 20 mg.  3. Actos 15 mg.  4. Zetia 10 mg.  5. Zocor 10 mg.  6. Aspirin 81 mg.  7. CombiPatch 0.05/0.14 mg twice weekly.  8. Ferrous sulfate 325.  9. Glucosamine and chondroitin.  10.      Caltrate Plus 600 D.  11.      Vitamin E 400 IU.  12.      Vitamin C 500 mg.  13.      Multivitamin.  14.      Folic acid.  15.      B12.   PAST MEDICAL HISTORY:  1. History of bronchitis.  2. History of pneumonia.  3. Hypertension.  4. Functional heart murmur.  5. Hypercholesterolemia.  6. Hemorrhoids.  7. History of anemia.  8. Noninsulin-dependent diabetes mellitus.  9. Postmenopausal.   PAST SURGICAL HISTORY:  1. Left knee replacement in April 2000.  2. Cholecystectomy in 1991.  3. Hemorrhoidectomy in 1977.  4. C-section x2 in 1972 and 1976.  5. Recently underwent an EGD and colonoscopy in October 2004.   SOCIAL HISTORY:  Divorced, retired, nonsmoker. No alcohol use. Has two  children. She lives in a one story apartment.   FAMILY HISTORY:  Father is deceased at age 36.  Mother living, age 27.  History of heart disease, hypertension, diabetes, and arthritis.   REVIEW OF  SYSTEMS:  GENERAL:  No fever, chills, or night sweats. NEUROLOGIC:  No seizure, syncope, or paralysis. RESPIRATORY:  Shortness of breath,  productive cough or hemoptysis. CARDIOVASCULAR: Chest pain, angina, and  orthopnea. GASTROINTESTINAL:  She does have some intermittent constipation.  GENITOURINARY:  No dysuria, hematuria, or discharge.  MUSCULOSKELETAL:  Pertinent to that of the left shoulder found in the history of present  illness.   PHYSICAL EXAMINATION:  VITAL SIGNS:  Pulse 76, respirations 14, blood  pressure 148/68.  GENERAL:  The patient is a 77 year old African American female who is well  nourished, well developed, and in no acute distress. She is alert, oriented,  and cooperative.  HEENT:  Normocephalic and atraumatic. Pupils are round and reactive.  NECK:  Supple.  CHEST:  Clear with the exception of some fine inspiratory crackles noted at  the left base; otherwise, clear.  HEART:  She does have a crescendo/decrescendo type systolic murmur. S1 and  S2 noted. No rubs, thrills, or palpitations.  ABDOMEN:  Soft, round abdomen, nontender palpation. Bowel sounds are  present.  RECTAL/BREASTS/GENITALIA:  Not done; refer to history of present illness.  EXTREMITIES:  At the left shoulder, the patient has only forward elevation  of about 100 degrees,  abduction of about 100 degrees, and external rotation  only 30 degrees. She does have marked crepitus noted with passive range of  motion of the shoulder and pain elicited on extreme flexion and abduction.   IMPRESSION:  1. Osteoarthritis of the left shoulder.  2. History of bronchitis.  3. History of pneumonia.  4. Hypertension.  5. Functional heart murmur.  6. Hypercholesterolemia.  7. Hemorrhoids.  8. History of anemia.  9. Noninsulin-dependent diabetes mellitus.  10.      Postmenopausal.   PLAN:  The patient will be admitted to [facility] and  will undergo left shoulder hemiarthroplasty versus a total shoulder  arthroplasty. The surgery will be performed by Dr. Ollen Gross. The  patient's medical physician is Dr. Dorothyann Peng. Dr. Allyne Gee will be  notified of the admission and will be consulted if needed for any medical  assistance with the patient throughout the hospital course.     Alexzandrew L. Julien Girt, P.A.-C            Ollen Gross, M.D.    ALP/MEDQ  D:  10/26/2003  T:  10/26/2003  Job:  [id number]   cc:   Candyce Churn. Allyne Gee, M.D.  [address]  [address]  [place]  [place] [address]  Fax: [phone]

## 2011-04-10 NOTE — Op Note (Signed)
NAME:  Bonnie Chavez, Bonnie Chavez                         ACCOUNT NO.:  [id number]   MEDICAL RECORD NO.:  [id number]                   PATIENT TYPE:  AMB   LOCATION:  ENDO                                 FACILITY:  [facility]   PHYSICIAN:  Anselmo Rod, M.D.               DATE OF BIRTH:  19-Mar-1934   DATE OF PROCEDURE:  09/12/2003  DATE OF DISCHARGE:                                 OPERATIVE REPORT   PROCEDURE:  Screening colonoscopy.   ENDOSCOPIST:  Anselmo Rod, M.D.   INSTRUMENT USED:  Olympus video colonoscope.   INDICATIONS FOR PROCEDURE:  Iron deficiency anemia in a 77 year old African-  American female.  Rule out colonic polyps, masses, etc.   PRE-PROCEDURE PREPARATION:  Informed consent was procured from the patient.  The patient fasted for eight hours prior to the procedure and prepped with a  bottle of magnesium citrate and a gallon of GoLYTELY the night prior to the  procedure.   PRE-PROCEDURE PHYSICAL:  VITAL SIGNS:  Stable.  NECK:  Supple.  CHEST:  Clear to auscultation.  CARDIOVASCULAR:  S1 and S2 are regular.  ABDOMEN:  Soft with normal bowel sounds.   DESCRIPTION OF PROCEDURE:  The patient was placed in the left lateral  decubitus position and sedated with Demerol and Versed for the  esophagogastroduodenoscopy.  No additional sedation was used for the  colonoscopy.  Once the patient was adequately sedated and maintained on low  flow oxygen and continuous cardiac monitoring the Olympus video colonoscope  was advanced from the rectum to the cecum with slight difficulty.  The  patient had a very tortuous colon.  The patient's position was changed from  the left lateral to the supine and the right lateral position which allowed  application of abdominal pressure to reach the cecal base. The appendical  orifice and ileocecal valve were visualized and photographed.  No masses,  polyps, erosions, ulcerations or diverticula were seen.  Multiple washings  were done.  Small  lesions could have been missed secondary to residual stool  in the colon.  The patient tolerated the procedure well without  complications.  Retroflexion in the rectum revealed small internal  hemorrhoids.   IMPRESSION:  1. Essentially normal colonoscopy to the cecum except for small internal     hemorrhoids seen on retroflexion.  2. Residual stool in the colon and very small lesions could have been     missed.  3. Very tortuous atonic colon.   RECOMMENDATIONS:  1. Continue high fiber diet with liberal fluid intake.  2.     Repeat colonoscopy screening in the next ten years unless the patient     develops any further symptoms in the interim.  3. Outpatient follow-up in the next two weeks for further recommendations.  Anselmo Rod, M.D.    JNM/MEDQ  D:  09/12/2003  T:  09/12/2003  Job:  [id number]   cc:   Candyce Churn. Allyne Gee, M.D.  [address]  [address]  [place]  [place] [address]  Fax: [phone]

## 2011-04-10 NOTE — Discharge Summary (Signed)
NAME:  Bonnie Chavez, Bonnie Chavez                         ACCOUNT NO.:  [id number]   MEDICAL RECORD NO.:  [id number]                   PATIENT TYPE:  INP   LOCATION:  0479                                 FACILITY:  [facility]   PHYSICIAN:  Ollen Gross, M.D.                 DATE OF BIRTH:  [birth year]   DATE OF ADMISSION:  10/31/2003  DATE OF DISCHARGE:  11/02/2003                                 DISCHARGE SUMMARY   ADMISSION DIAGNOSES:  1. Osteoarthritis, left shoulder.  2. History of bronchitis.  3. History of pneumonia.  4. Hypertension.  5. Functional heart murmur.  6. Hypercholesterolemia.  7. Hemorrhoids.  8. History of anemia.  9. Non-insulin-dependent diabetes mellitus.  10.      Postmenopausal.   DISCHARGE DIAGNOSES:  1. Osteoarthritis, left shoulder; status post left shoulder     hemiarthroplasty.  2. History of bronchitis.  3. History of pneumonia.  4. Hypertension.  5. Functional heart murmur.  6. Hypercholesterolemia.  7. Hemorrhoids.  8. History of anemia.  9. Non-insulin-dependent diabetes mellitus.  10.      Postmenopausal.   PROCEDURE:  Date of surgery, October 31, 2003, left shoulder  hemiarthroplasty. Surgeon was Dr. Homero Fellers Aluisio. Assistant was [employer].  Anesthesia was general with interscalene block. Blood loss  was 300 cc. No drains.   CONSULTS:  None.   BRIEF HISTORY:  Ms. Bonnie Chavez is a 77 year old female with end-stage arthritis  of the left shoulder. She has had collapse of the humeral head with bone-on-  bone changes. The pain has been refractory to nonoperative management. Now  presents for shoulder surgery.   LABORATORY DATA:  CBC on admission showed hemoglobin of 12.0, hematocrit  35.1, white cell count 10, red cell count 3.53. Differential within normal  limits. Postoperative H&H was 10.6 and 30.7. PT and PTT 12.7 and 32  respectively with INR of 0.9. Chemistry panel revealed minimally elevated  glucose of 130, elevated BUN of 36,  elevated calcium of 10.9. The remaining  chemistry panel all within normal limits.  Urinalysis did show trace  hemoglobin, rare epithelials, 0-2 red cells, 0-2 white blood cells, blood  group type A positive.  EKG dated October 24, 2003, revealed normal sinus  rhythm with minimal voltage criteria for LVH. No old tracing to compared.  Performed by Dr. Willa Rough. Chest x-ray dated October 24, 2003, revealed  dextroscoliosis of the lower thoracic spine. There was, otherwise, no  evidence of acute cardiopulmonary disease.   HOSPITAL COURSE:  The patient was admitted to [facility] and taken  to the OR on October 31, 2003, and underwent the above procedure. She  tolerated the procedure well and transferred to the recovery room on the  orthopedic floor for continued postoperative care. The patient was placed on  p.o. and IV analgesia for pain control following surgery. Home meds were  restarted. OT was consulted for ADLs  and pendulums. By postoperative day  one, the patient was doing much better. She had a sore throat.  Her pain was  greatly improved. PCA was discontinued. She started occupational therapy. By  the following day, November 02, 2003, she was doing quite well and  discharged home.   DISCHARGE MEDICATIONS AND PLAN:  Discharged home on November 02, 2003.  Diagnoses as above.   DIET:  As tolerated, diabetic diet, low cholesterol diet.   ACTIVITY:  Pendulum exercises for left upper extremity. ADLs as per OT,  maybe as tolerated. May start showering approximately four days after  surgery.   FOLLOWUP:  In one week.   DISPOSITION:  Home.   CONDITION ON DISCHARGE:  Improved.     Alexzandrew L. Julien Girt, P.A.              Ollen Gross, M.D.    ALP/MEDQ  D:  12/13/2003  T:  12/14/2003  Job:  [id number]

## 2011-04-10 NOTE — Op Note (Signed)
   NAME:  Bonnie Chavez, Bonnie Chavez                         ACCOUNT NO.:  [id number]   MEDICAL RECORD NO.:  [id number]                   PATIENT TYPE:  AMB   LOCATION:  ENDO                                 FACILITY:  [facility]   PHYSICIAN:  Anselmo Rod, M.D.               DATE OF BIRTH:  Mar 30, 1934   DATE OF PROCEDURE:  09/12/2003  DATE OF DISCHARGE:                                 OPERATIVE REPORT   PROCEDURE:  Esophagogastroduodenoscopy with biopsies.   ENDOSCOPIST:  Anselmo Rod, M.D.   INSTRUMENT USED:  Olympus video panendoscope.   INDICATIONS FOR PROCEDURE:  Iron deficiency anemia, in a 76 year old African-  American female.  Rule out peptic ulcer disease with acid gastritis, etc.   PRE-PROCEDURE PREPARATION:  An informed consent was procured from the  patient.  The patient was fasted for eight hours prior to the procedure.   PRE-PROCEDURE PHYSICAL EXAMINATION:  VITAL SIGNS:  Stable vital signs.  NECK:  Supple.  LUNGS:  Clear to auscultation.  HEART:  S1, S2 regular.  ABDOMEN:  Soft, with normal bowel sounds.   DESCRIPTION OF PROCEDURE:  The patient was placed in the left lateral  decubitus position and sedated with 70 mg of Demerol and 7 mg of Versed  intravenously.  Once the patient was adequately sedated and maintained on  low-flow oxygen and continuous cardiac monitoring, the Olympus video  panendoscope was advanced through the mouthpiece over the tongue, into the  esophagus under direct vision.  The entire esophagus appeared normal with no  evidence of rings, strictures, masses, esophagitis, or Barrett's mucosa.  The scope was then advanced into the stomach.  Retroflexion on the high  cardia revealed no abnormalities.  The proximal stomach and mid-body  appeared normal.  There was some antral gastritis.  Biopsies were done to  rule out the presence of H. Pylori by pathology.  The proximal small bowel  appeared normal.   IMPRESSION:  Normal esophagogastroduodenoscopy,  except for severe antral  gastritis.  Biopsies done for Helicobacter pylori.   RECOMMENDATIONS:  1. Await pathology results.  2. Treat with antibiotics if Helicobacter pylori present.  3.     PPI of choice.  4. Proceed with a colonoscopy at this time.  5. Avoid all nonsteroidals including aspirin until further orders.                                                 Anselmo Rod, M.D.    JNM/MEDQ  D:  09/12/2003  T:  09/12/2003  Job:  [id number]   cc:   Candyce Churn. Allyne Gee, M.D.  [address]  [address]  [place]  [place] [address]  Fax: [phone]

## 2011-04-23 ENCOUNTER — Ambulatory Visit
Admission: RE | Admit: 2011-04-23 | Discharge: 2011-04-23 | Disposition: A | Discharge status: Home / Self Care | Payer: Medicare Other | Source: Ambulatory Visit | Attending: Obstetrics and Gynecology | Admitting: Obstetrics and Gynecology

## 2011-04-23 DIAGNOSIS — Z1231 Encounter for screening mammogram for malignant neoplasm of breast: Secondary | ICD-10-CM

## 2011-11-27 ENCOUNTER — Other Ambulatory Visit: Payer: Self-pay | Admitting: Internal Medicine

## 2011-11-27 DIAGNOSIS — E049 Nontoxic goiter, unspecified: Secondary | ICD-10-CM

## 2011-12-01 DIAGNOSIS — E782 Mixed hyperlipidemia: Secondary | ICD-10-CM | POA: Diagnosis not present

## 2011-12-01 DIAGNOSIS — I119 Hypertensive heart disease without heart failure: Secondary | ICD-10-CM | POA: Diagnosis not present

## 2011-12-03 ENCOUNTER — Ambulatory Visit
Admission: RE | Admit: 2011-12-03 | Discharge: 2011-12-03 | Disposition: A | Discharge status: Home / Self Care | Payer: Medicare Other | Source: Ambulatory Visit | Attending: Internal Medicine | Admitting: Internal Medicine

## 2011-12-03 DIAGNOSIS — E042 Nontoxic multinodular goiter: Secondary | ICD-10-CM | POA: Diagnosis not present

## 2011-12-03 DIAGNOSIS — E049 Nontoxic goiter, unspecified: Secondary | ICD-10-CM

## 2011-12-17 DIAGNOSIS — R142 Eructation: Secondary | ICD-10-CM | POA: Diagnosis not present

## 2011-12-17 DIAGNOSIS — R143 Flatulence: Secondary | ICD-10-CM | POA: Diagnosis not present

## 2011-12-17 DIAGNOSIS — R141 Gas pain: Secondary | ICD-10-CM | POA: Diagnosis not present

## 2011-12-17 DIAGNOSIS — E669 Obesity, unspecified: Secondary | ICD-10-CM | POA: Diagnosis not present

## 2011-12-17 DIAGNOSIS — Z1211 Encounter for screening for malignant neoplasm of colon: Secondary | ICD-10-CM | POA: Diagnosis not present

## 2011-12-18 DIAGNOSIS — E119 Type 2 diabetes mellitus without complications: Secondary | ICD-10-CM | POA: Diagnosis not present

## 2011-12-18 DIAGNOSIS — I10 Essential (primary) hypertension: Secondary | ICD-10-CM | POA: Diagnosis not present

## 2011-12-18 DIAGNOSIS — I129 Hypertensive chronic kidney disease with stage 1 through stage 4 chronic kidney disease, or unspecified chronic kidney disease: Secondary | ICD-10-CM | POA: Diagnosis not present

## 2011-12-18 DIAGNOSIS — N183 Chronic kidney disease, stage 3 unspecified: Secondary | ICD-10-CM | POA: Diagnosis not present

## 2011-12-18 DIAGNOSIS — N390 Urinary tract infection, site not specified: Secondary | ICD-10-CM | POA: Diagnosis not present

## 2012-01-13 DIAGNOSIS — N951 Menopausal and female climacteric states: Secondary | ICD-10-CM

## 2012-01-13 DIAGNOSIS — E119 Type 2 diabetes mellitus without complications: Secondary | ICD-10-CM

## 2012-01-13 DIAGNOSIS — N952 Postmenopausal atrophic vaginitis: Secondary | ICD-10-CM

## 2012-01-13 DIAGNOSIS — N840 Polyp of corpus uteri: Secondary | ICD-10-CM

## 2012-01-13 DIAGNOSIS — M1990 Unspecified osteoarthritis, unspecified site: Secondary | ICD-10-CM

## 2012-01-13 DIAGNOSIS — N950 Postmenopausal bleeding: Secondary | ICD-10-CM

## 2012-01-18 DIAGNOSIS — Z79899 Other long term (current) drug therapy: Secondary | ICD-10-CM | POA: Diagnosis not present

## 2012-01-18 DIAGNOSIS — E1129 Type 2 diabetes mellitus with other diabetic kidney complication: Secondary | ICD-10-CM | POA: Diagnosis not present

## 2012-01-18 DIAGNOSIS — N183 Chronic kidney disease, stage 3 unspecified: Secondary | ICD-10-CM | POA: Diagnosis not present

## 2012-01-19 ENCOUNTER — Other Ambulatory Visit: Payer: Self-pay | Admitting: Endocrinology

## 2012-01-19 DIAGNOSIS — I10 Essential (primary) hypertension: Secondary | ICD-10-CM | POA: Diagnosis not present

## 2012-01-19 DIAGNOSIS — IMO0001 Reserved for inherently not codable concepts without codable children: Secondary | ICD-10-CM | POA: Diagnosis not present

## 2012-01-19 DIAGNOSIS — E049 Nontoxic goiter, unspecified: Secondary | ICD-10-CM | POA: Diagnosis not present

## 2012-01-19 DIAGNOSIS — E041 Nontoxic single thyroid nodule: Secondary | ICD-10-CM

## 2012-01-27 ENCOUNTER — Ambulatory Visit
Admission: RE | Admit: 2012-01-27 | Discharge: 2012-01-27 | Disposition: A | Discharge status: Home / Self Care | Payer: Medicare Other | Source: Ambulatory Visit | Attending: Endocrinology | Admitting: Endocrinology

## 2012-01-27 ENCOUNTER — Other Ambulatory Visit (HOSPITAL_COMMUNITY)
Admission: RE | Admit: 2012-01-27 | Discharge: 2012-01-27 | Disposition: A | Discharge status: Home / Self Care | Payer: Medicare Other | Source: Ambulatory Visit | Attending: Interventional Radiology | Admitting: Interventional Radiology

## 2012-01-27 DIAGNOSIS — E049 Nontoxic goiter, unspecified: Secondary | ICD-10-CM | POA: Insufficient documentation

## 2012-01-27 DIAGNOSIS — E041 Nontoxic single thyroid nodule: Secondary | ICD-10-CM

## 2012-02-08 DIAGNOSIS — Z1211 Encounter for screening for malignant neoplasm of colon: Secondary | ICD-10-CM | POA: Diagnosis not present

## 2012-02-08 DIAGNOSIS — D126 Benign neoplasm of colon, unspecified: Secondary | ICD-10-CM | POA: Diagnosis not present

## 2012-02-10 DIAGNOSIS — E1139 Type 2 diabetes mellitus with other diabetic ophthalmic complication: Secondary | ICD-10-CM | POA: Diagnosis not present

## 2012-02-24 ENCOUNTER — Ambulatory Visit (INDEPENDENT_AMBULATORY_CARE_PROVIDER_SITE_OTHER): Payer: Medicare Other | Admitting: Obstetrics and Gynecology

## 2012-02-24 DIAGNOSIS — N951 Menopausal and female climacteric states: Secondary | ICD-10-CM

## 2012-03-17 ENCOUNTER — Other Ambulatory Visit: Payer: Self-pay | Admitting: Obstetrics and Gynecology

## 2012-03-17 DIAGNOSIS — Z1231 Encounter for screening mammogram for malignant neoplasm of breast: Secondary | ICD-10-CM

## 2012-04-26 DIAGNOSIS — E213 Hyperparathyroidism, unspecified: Secondary | ICD-10-CM | POA: Diagnosis not present

## 2012-04-26 DIAGNOSIS — N183 Chronic kidney disease, stage 3 unspecified: Secondary | ICD-10-CM | POA: Diagnosis not present

## 2012-04-26 DIAGNOSIS — N2581 Secondary hyperparathyroidism of renal origin: Secondary | ICD-10-CM | POA: Diagnosis not present

## 2012-04-26 DIAGNOSIS — D649 Anemia, unspecified: Secondary | ICD-10-CM | POA: Diagnosis not present

## 2012-04-26 DIAGNOSIS — I10 Essential (primary) hypertension: Secondary | ICD-10-CM | POA: Diagnosis not present

## 2012-04-26 DIAGNOSIS — I129 Hypertensive chronic kidney disease with stage 1 through stage 4 chronic kidney disease, or unspecified chronic kidney disease: Secondary | ICD-10-CM | POA: Diagnosis not present

## 2012-04-26 DIAGNOSIS — E119 Type 2 diabetes mellitus without complications: Secondary | ICD-10-CM | POA: Diagnosis not present

## 2012-04-28 ENCOUNTER — Ambulatory Visit
Admission: RE | Admit: 2012-04-28 | Discharge: 2012-04-28 | Disposition: A | Discharge status: Home / Self Care | Payer: Medicare Other | Source: Ambulatory Visit | Attending: Obstetrics and Gynecology | Admitting: Obstetrics and Gynecology

## 2012-04-28 DIAGNOSIS — Z1231 Encounter for screening mammogram for malignant neoplasm of breast: Secondary | ICD-10-CM

## 2012-05-24 DIAGNOSIS — I120 Hypertensive chronic kidney disease with stage 5 chronic kidney disease or end stage renal disease: Secondary | ICD-10-CM | POA: Diagnosis not present

## 2012-06-13 ENCOUNTER — Ambulatory Visit (INDEPENDENT_AMBULATORY_CARE_PROVIDER_SITE_OTHER): Payer: Medicare Other | Admitting: Family

## 2012-06-13 ENCOUNTER — Encounter: Payer: Self-pay | Admitting: Family

## 2012-06-13 VITALS — BP 158/50 | HR 97 | Temp 98.6°F | Ht 64.0 in | Wt 196.0 lb

## 2012-06-13 DIAGNOSIS — E119 Type 2 diabetes mellitus without complications: Secondary | ICD-10-CM

## 2012-06-13 DIAGNOSIS — I440 Atrioventricular block, first degree: Secondary | ICD-10-CM | POA: Diagnosis not present

## 2012-06-13 DIAGNOSIS — I10 Essential (primary) hypertension: Secondary | ICD-10-CM | POA: Diagnosis not present

## 2012-06-13 DIAGNOSIS — E7800 Pure hypercholesterolemia, unspecified: Secondary | ICD-10-CM

## 2012-06-13 LAB — BASIC METABOLIC PANEL
Chloride: 102 mEq/L (ref 96–112)
GFR: 44.07 mL/min — ABNORMAL LOW (ref >60.00)
Potassium: 4.2 mEq/L (ref 3.5–5.1)
Sodium: 137 mEq/L (ref 135–145)

## 2012-06-13 LAB — CBC WITH DIFFERENTIAL/PLATELET
Basophils Relative: 0.5 % (ref 0.0–3.0)
Eosinophils Relative: 2.7 % (ref 0.0–5.0)
HCT: 37.5 % (ref 36.0–46.0)
Hemoglobin: 12.5 g/dL (ref 12.0–15.0)
Lymphs Abs: 1.4 10*3/uL (ref 0.7–4.0)
MCV: 99.7 fl (ref 78.0–100.0)
Monocytes Absolute: 0.7 10*3/uL (ref 0.1–1.0)
Monocytes Relative: 9.4 % (ref 3.0–12.0)
Neutro Abs: 5.5 10*3/uL (ref 1.4–7.7)
RBC: 3.77 Mil/uL — ABNORMAL LOW (ref 3.87–5.11)
WBC: 7.9 10*3/uL (ref 4.5–10.5)

## 2012-06-13 LAB — LIPID PANEL
Cholesterol: 146 mg/dL (ref 0–200)
LDL Cholesterol: 78 mg/dL (ref 0–99)
Total CHOL/HDL Ratio: 3
VLDL: 14 mg/dL (ref 0.0–40.0)

## 2012-06-13 MED ORDER — LINAGLIPTIN 5 MG PO TABS: 5.0000 mg | ORAL_TABLET | Freq: Every day | ORAL | Status: DC

## 2012-06-13 MED ORDER — LANCETS MISC: 1.0000 [IU] | Freq: Two times a day (BID) | Status: DC | PRN

## 2012-06-13 MED ORDER — GLUCOSE BLOOD VI STRP: ORAL_STRIP | Status: DC

## 2012-06-13 NOTE — Progress Notes (Signed)
Subjective:    Patient ID: Bonnie Chavez, female    DOB: 10-01-1934, 78 y.o.   MRN: [id number]  HPI 78 year old [place] American female, new patient to the practice is in to be established. She has a history of type 2 diabetes, hypertension, hyperlipidemia, and a first degree heart block. Her last hemoglobin A1c was in February and was 6.6. Her fasting blood sugars range from 129-150. She has a history of arthritis.  Last gynecological appointment was in April 2013, mammogram June 2013, colonoscopy March 2013. Annual eye exams. She participate in water aerobics three times a week.   Patient has concerns of tingling in her hands particularly at bedtime. The right hand is worse than the left. Occurs about once a week and has been going on for about a year off and on. She has a history of carpal tunnel syndrome. In the past, she's taken vitamin B6 that is helped.  Patient has complaints of a spasm in her right thigh that occurs about once a month over the last year. She applies cream to the area that helps to relieve the symptoms. Denies any muscle weakness.   Review of Systems  Constitutional: Negative.   HENT: Negative.   Eyes: Negative.   Respiratory: Negative.   Cardiovascular: Negative.   Gastrointestinal: Negative.   Genitourinary: Negative.   Musculoskeletal: Negative.   Skin: Negative.   Neurological: Negative.        C/o tingling in her hands at night.   Hematological: Negative.   Psychiatric/Behavioral: Negative.    Past Medical History  Diagnosis Date  . Hypertension   . Diabetes mellitus   . Migraine   . History of chicken pox   . History of measles, mumps, or rubella   . Hyperlipidemia   . Osteoarthritis   . PMB (postmenopausal bleeding) 02/28/2009    Resolved  . Vaginal atrophy   . Vitamin d deficiency   . Mitral valve prolapse     History   Social History  . Marital Status: Divorced    Spouse Name: N/A    Number of Children: N/A  . Years of Education: N/A    Occupational History  . Not on file.   Social History Main Topics  . Smoking status: Never Smoker   . Smokeless tobacco: Not on file  . Alcohol Use: No  . Drug Use: No  . Sexually Active: Not on file   Other Topics Concern  . Not on file   Social History Narrative  . No narrative on file    Past Surgical History  Procedure Date  . Cesarean section 1972 &1976  . Tubal ligation 1976  . Replacement total knee 2000    left  . Dilation and curettage of uterus 1975    Family History  Problem Relation Age of Onset  . Hypertension Mother   . Diabetes Mother   . Heart disease Father   . Diabetes Brother     Allergies  Allergen Reactions  . Contrast Media (Iodinated Diagnostic Agents)   . Nsaids     Abnormal renal function  . Penicillins Rash    Current Outpatient Prescriptions on File Prior to Visit  Medication Sig Dispense Refill  . amLODipine (NORVASC) 10 MG tablet Take 10 mg by mouth daily.      [name] Kitchen estradiol-norethindrone (COMBIPATCH) 0.05-0.25 MG/DAY Place 1 patch onto the skin 2 (two) times a week.      . furosemide (LASIX) 40 MG tablet Take 40 mg by mouth daily.      [name] Kitchen  linagliptin (TRADJENTA) 5 MG TABS tablet Take 1 tablet (5 mg total) by mouth daily.  30 tablet  5  . lisinopril (PRINIVIL,ZESTRIL) 10 MG tablet Take 10 mg by mouth daily.      . nebivolol (BYSTOLIC) 5 MG tablet Take 5 mg by mouth. Take 1 1/2 tab bid      . rosuvastatin (CRESTOR) 40 MG tablet Take 40 mg by mouth daily.        BP 158/50  Pulse 97  Temp 98.6 F (37 C) (Oral)  Ht 5\' 4"  (1.626 m)  Wt 196 lb (88.905 kg)  BMI 33.64 kg/m2  SpO2 98%chart    Objective:   Physical Exam  Constitutional: She is oriented to person, place, and time. She appears well-developed and well-nourished.  HENT:  Right Ear: External ear normal.  Left Ear: External ear normal.  Mouth/Throat: Oropharynx is clear and moist.  Eyes: Pupils are equal, round, and reactive to light.  Neck: Normal range of motion.  Neck supple.  Cardiovascular: Normal rate, regular rhythm and normal heart sounds.   Pulmonary/Chest: Effort normal and breath sounds normal.  Abdominal: Soft. Bowel sounds are normal.  Musculoskeletal: Normal range of motion.  Neurological: She is alert and oriented to person, place, and time.  Skin: Skin is warm and dry.  Psychiatric: She has a normal mood and affect.          Assessment & Plan:  Assessment: Type 2 diabetes, Hypercholesterolemia, 1 degree heart block, Carpal Tunnel Syndrome, Hypertension  Plan: Lab sent to include BMP, CBC, lipids, hemoglobin A1c. Encouraged healthy diet and exercise. Request medical records from previous PCP.

## 2012-06-13 NOTE — Patient Instructions (Signed)

## 2012-06-15 ENCOUNTER — Encounter: Payer: Self-pay | Admitting: Obstetrics and Gynecology

## 2012-06-16 DIAGNOSIS — I120 Hypertensive chronic kidney disease with stage 5 chronic kidney disease or end stage renal disease: Secondary | ICD-10-CM | POA: Diagnosis not present

## 2012-06-16 DIAGNOSIS — N183 Chronic kidney disease, stage 3 unspecified: Secondary | ICD-10-CM | POA: Diagnosis not present

## 2012-06-21 ENCOUNTER — Telehealth: Payer: Self-pay | Admitting: Obstetrics and Gynecology

## 2012-06-23 NOTE — Telephone Encounter (Signed)
Tc to pt per telephone call. Dense breast letter explained to pt. Pt voices understanding.  

## 2012-06-27 ENCOUNTER — Telehealth: Payer: Self-pay | Admitting: Family

## 2012-06-27 NOTE — Telephone Encounter (Signed)
Cholesterol is too high. Please find out if she is taking her Simvastatin daily. If not, start. If so, we may have to switch medications.

## 2012-06-27 NOTE — Telephone Encounter (Signed)
Note sent in error, per [facility]

## 2012-07-07 DIAGNOSIS — N183 Chronic kidney disease, stage 3 unspecified: Secondary | ICD-10-CM | POA: Diagnosis not present

## 2012-07-19 DIAGNOSIS — I10 Essential (primary) hypertension: Secondary | ICD-10-CM | POA: Diagnosis not present

## 2012-07-19 DIAGNOSIS — E049 Nontoxic goiter, unspecified: Secondary | ICD-10-CM | POA: Diagnosis not present

## 2012-07-19 DIAGNOSIS — IMO0001 Reserved for inherently not codable concepts without codable children: Secondary | ICD-10-CM | POA: Diagnosis not present

## 2012-07-28 DIAGNOSIS — I2510 Atherosclerotic heart disease of native coronary artery without angina pectoris: Secondary | ICD-10-CM | POA: Diagnosis not present

## 2012-07-28 DIAGNOSIS — E782 Mixed hyperlipidemia: Secondary | ICD-10-CM | POA: Diagnosis not present

## 2012-08-23 DIAGNOSIS — Z23 Encounter for immunization: Secondary | ICD-10-CM | POA: Diagnosis not present

## 2012-09-15 ENCOUNTER — Encounter: Payer: Self-pay | Admitting: Family

## 2012-09-15 ENCOUNTER — Ambulatory Visit (INDEPENDENT_AMBULATORY_CARE_PROVIDER_SITE_OTHER): Payer: Medicare Other | Admitting: Family

## 2012-09-15 VITALS — BP 130/70 | HR 63 | Temp 98.4°F | Ht 65.0 in | Wt 195.0 lb

## 2012-09-15 DIAGNOSIS — I10 Essential (primary) hypertension: Secondary | ICD-10-CM

## 2012-09-15 DIAGNOSIS — E119 Type 2 diabetes mellitus without complications: Secondary | ICD-10-CM

## 2012-09-15 DIAGNOSIS — E7800 Pure hypercholesterolemia, unspecified: Secondary | ICD-10-CM

## 2012-09-15 DIAGNOSIS — Z0000 Encounter for general adult medical examination without abnormal findings: Secondary | ICD-10-CM | POA: Diagnosis not present

## 2012-09-15 LAB — CBC WITH DIFFERENTIAL/PLATELET
Basophils Relative: 0.3 % (ref 0.0–3.0)
Eosinophils Relative: 1.9 % (ref 0.0–5.0)
HCT: 38.9 % (ref 36.0–46.0)
MCV: 99.1 fl (ref 78.0–100.0)
Monocytes Absolute: 0.8 10*3/uL (ref 0.1–1.0)
Monocytes Relative: 10 % (ref 3.0–12.0)
Neutrophils Relative %: 67.4 % (ref 43.0–77.0)
RBC: 3.93 Mil/uL (ref 3.87–5.11)
WBC: 8.5 10*3/uL (ref 4.5–10.5)

## 2012-09-15 LAB — POCT URINALYSIS DIPSTICK
Bilirubin, UA: NEGATIVE
Ketones, UA: NEGATIVE
Leukocytes, UA: NEGATIVE
Nitrite, UA: NEGATIVE
Protein, UA: NEGATIVE

## 2012-09-15 NOTE — Patient Instructions (Signed)

## 2012-09-16 ENCOUNTER — Encounter: Payer: Self-pay | Admitting: Family

## 2012-09-16 LAB — BASIC METABOLIC PANEL
Chloride: 103 mEq/L (ref 96–112)
GFR: 45.46 mL/min — ABNORMAL LOW (ref >60.00)
Potassium: 4 mEq/L (ref 3.5–5.1)

## 2012-09-16 LAB — LIPID PANEL
LDL Cholesterol: 96 mg/dL (ref 0–99)
Total CHOL/HDL Ratio: 3

## 2012-09-16 LAB — TSH: TSH: 1.95 u[IU]/mL (ref 0.35–5.50)

## 2012-09-16 LAB — HEMOGLOBIN A1C: Hgb A1c MFr Bld: 6.9 % — ABNORMAL HIGH (ref 4.6–6.5)

## 2012-09-16 NOTE — Progress Notes (Signed)
Subjective:    Patient ID: Bonnie Chavez, female    DOB: [birth year], 78 y.o.   MRN: [id number]  HPI 78 year old [place] American female, nonsmoker is in for complete physical exam today. She has a history of type 2 diabetes, hypertension, hyperlipidemia. She is doing well her current medications. Last A1c was 7.0. Blood pressure range is 110 to 130 systolically over 50 to 60s. Blood sugar range 120-140 fasting. She sees endocrinology for management of a goiter. Denies any concerns today. She sees gynecology for GYN care.  Reviewed all health maintenance protocols including mammography colonoscopy bone density and reviewed appropriate screening labs. Her immunization history was reviewed as well as her current medications and allergies refills of her chronic medications were given and the plan for yearly health maintenance was discussed all orders and referrals were made as appropriate.   Review of Systems  Constitutional: Negative.   HENT: Negative.   Eyes: Negative.   Respiratory: Negative.   Cardiovascular: Negative.   Gastrointestinal: Negative.   Genitourinary: Negative.   Musculoskeletal: Negative.   Skin: Negative.   Neurological: Negative.   Hematological: Negative.   Psychiatric/Behavioral: Negative.    Past Medical History  Diagnosis Date  . Hypertension   . Diabetes mellitus   . Migraine   . History of chicken pox   . History of measles, mumps, or rubella   . Hyperlipidemia   . Osteoarthritis   . PMB (postmenopausal bleeding) 02/28/2009    Resolved  . Vaginal atrophy   . Vitamin D deficiency   . Mitral valve prolapse     History   Social History  . Marital Status: Divorced    Spouse Name: N/A    Number of Children: N/A  . Years of Education: N/A   Occupational History  . Not on file.   Social History Main Topics  . Smoking status: Never Smoker   . Smokeless tobacco: Not on file  . Alcohol Use: No  . Drug Use: No  . Sexually Active: Not on file    Other Topics Concern  . Not on file   Social History Narrative  . No narrative on file    Past Surgical History  Procedure Date  . Cesarean section 1972 &1976  . Tubal ligation 1976  . Replacement total knee 2000    left  . Dilation and curettage of uterus 1975    Family History  Problem Relation Age of Onset  . Hypertension Mother   . Diabetes Mother   . Heart disease Father   . Diabetes Brother     Allergies  Allergen Reactions  . Contrast Media (Iodinated Diagnostic Agents)   . Nsaids     Abnormal renal function  . Penicillins Rash    Current Outpatient Prescriptions on File Prior to Visit  Medication Sig Dispense Refill  . amLODipine (NORVASC) 10 MG tablet Take 10 mg by mouth daily.      [name] Kitchen aspirin 81 MG EC tablet Take 81 mg by mouth daily. Swallow whole.      [name] Kitchen co-enzyme Q-10 30 MG capsule Take 30 mg by mouth 2 (two) times daily.      [name] Kitchen estradiol-norethindrone (COMBIPATCH) 0.05-0.25 MG/DAY Place 1 patch onto the skin 2 (two) times a week.      . fish oil-omega-3 fatty acids 1000 MG capsule Take 2 g by mouth daily.      . furosemide (LASIX) 40 MG tablet Take 40 mg by mouth daily.      [name] Kitchen glucose  blood test strip Check blood glucose twice daily and as needed  100 each  11  . Lancets MISC 1 Units by Does not apply route 2 (two) times daily as needed. One touch ultra soft lancets  100 each  111  . linagliptin (TRADJENTA) 5 MG TABS tablet Take 1 tablet (5 mg total) by mouth daily.  30 tablet  5  . lisinopril (PRINIVIL,ZESTRIL) 10 MG tablet Take 10 mg by mouth daily.      . nebivolol (BYSTOLIC) 5 MG tablet Take 5 mg by mouth. Take 1 1/2 tab bid      . rosuvastatin (CRESTOR) 40 MG tablet Take 40 mg by mouth daily.        BP 130/70  Pulse 63  Temp 98.4 F (36.9 C) (Oral)  Ht 5\' 5"  (1.651 m)  Wt 195 lb (88.451 kg)  BMI 32.45 kg/m2  SpO2 98%chart    Objective:   Physical Exam  Constitutional: She is oriented to person, place, and time. She appears  well-developed and well-nourished.  HENT:  Head: Normocephalic and atraumatic.  Right Ear: External ear normal.  Left Ear: External ear normal.  Nose: Nose normal.  Mouth/Throat: Oropharynx is clear and moist. No oropharyngeal exudate.  Eyes: Conjunctivae normal and EOM are normal. Pupils are equal, round, and reactive to light. Right eye exhibits no discharge. Left eye exhibits no discharge. No scleral icterus.  Neck: Normal range of motion. Neck supple. No JVD present.       Palpable goiter  Cardiovascular: Normal rate, regular rhythm, normal heart sounds and intact distal pulses.   Pulmonary/Chest: Effort normal and breath sounds normal. No stridor.  Abdominal: Soft. Bowel sounds are normal. She exhibits no distension. There is no tenderness. There is no rebound and no guarding.  Genitourinary:       Deferred to GYN  Musculoskeletal: Normal range of motion. She exhibits no edema and no tenderness.  Lymphadenopathy:    She has no cervical adenopathy.  Neurological: She is alert and oriented to person, place, and time. She has normal reflexes. She displays normal reflexes. No cranial nerve deficit. She exhibits normal muscle tone. Coordination normal.  Skin: Skin is warm and dry.  Psychiatric: She has a normal mood and affect.   EKG:  within normal limits.        Assessment & Plan:  Assessment: Complete physical exam: Type 2 diabetes-controlled, hypertension, hyperlipidemia  Plan: Lab sent to include BMP, CBC, lipids, A1c, TSH notify patient pending results. Advised annual diabetic eye exam she has been feeling. Personality diet, exercise, weight reduction. We'll follow with patient in 3-4 months and sooner when necessary.

## 2012-12-01 ENCOUNTER — Other Ambulatory Visit: Payer: Self-pay | Admitting: Family

## 2012-12-06 DIAGNOSIS — I10 Essential (primary) hypertension: Secondary | ICD-10-CM | POA: Diagnosis not present

## 2012-12-06 DIAGNOSIS — N183 Chronic kidney disease, stage 3 unspecified: Secondary | ICD-10-CM | POA: Diagnosis not present

## 2012-12-06 DIAGNOSIS — N2581 Secondary hyperparathyroidism of renal origin: Secondary | ICD-10-CM | POA: Diagnosis not present

## 2012-12-06 DIAGNOSIS — E213 Hyperparathyroidism, unspecified: Secondary | ICD-10-CM | POA: Diagnosis not present

## 2012-12-06 DIAGNOSIS — I129 Hypertensive chronic kidney disease with stage 1 through stage 4 chronic kidney disease, or unspecified chronic kidney disease: Secondary | ICD-10-CM | POA: Diagnosis not present

## 2012-12-06 DIAGNOSIS — D649 Anemia, unspecified: Secondary | ICD-10-CM | POA: Diagnosis not present

## 2012-12-06 DIAGNOSIS — E119 Type 2 diabetes mellitus without complications: Secondary | ICD-10-CM | POA: Diagnosis not present

## 2013-01-16 ENCOUNTER — Encounter: Payer: Self-pay | Admitting: Family

## 2013-01-16 ENCOUNTER — Ambulatory Visit (INDEPENDENT_AMBULATORY_CARE_PROVIDER_SITE_OTHER): Payer: Medicare Other | Admitting: Family

## 2013-01-16 VITALS — BP 134/82 | HR 75 | Temp 98.5°F | Wt 191.0 lb

## 2013-01-16 DIAGNOSIS — R5381 Other malaise: Secondary | ICD-10-CM

## 2013-01-16 DIAGNOSIS — E7800 Pure hypercholesterolemia, unspecified: Secondary | ICD-10-CM | POA: Diagnosis not present

## 2013-01-16 DIAGNOSIS — I10 Essential (primary) hypertension: Secondary | ICD-10-CM | POA: Diagnosis not present

## 2013-01-16 DIAGNOSIS — E210 Primary hyperparathyroidism: Secondary | ICD-10-CM | POA: Diagnosis not present

## 2013-01-16 DIAGNOSIS — E119 Type 2 diabetes mellitus without complications: Secondary | ICD-10-CM

## 2013-01-16 DIAGNOSIS — R5383 Other fatigue: Secondary | ICD-10-CM | POA: Diagnosis not present

## 2013-01-16 DIAGNOSIS — D510 Vitamin B12 deficiency anemia due to intrinsic factor deficiency: Secondary | ICD-10-CM

## 2013-01-16 DIAGNOSIS — E049 Nontoxic goiter, unspecified: Secondary | ICD-10-CM | POA: Diagnosis not present

## 2013-01-16 LAB — LIPID PANEL
Cholesterol: 146 mg/dL (ref 0–200)
LDL Cholesterol: 86 mg/dL (ref 0–99)
Total CHOL/HDL Ratio: 3
Triglycerides: 74 mg/dL (ref 0.0–149.0)

## 2013-01-16 NOTE — Progress Notes (Signed)
Subjective:    Patient ID: Bonnie Chavez, female    DOB: 12-Feb-1934, 79 y.o.   MRN: [id number]  HPI  79 year old African American female, nonsmoker is in for recheck of type 2 diabetes, hypercholesterolemia, hypertension. She's currently tall well. Fasting blood sugars are 90s to 124. Postprandial readings are usually in the 140s. She's tolerating all of her medications well. Not currently exercising. She sees nephrology for management of her renal function is currently stable. She has an appointment to see neurology for carpal tunnel syndrome that is worsening in the right hand on 01/30/2013. She currently takes Tylenol because she's allergic to NSAIDs.   Review of Systems  Constitutional: Negative.   HENT: Negative.   Respiratory: Negative.   Cardiovascular: Negative.   Gastrointestinal: Negative.   Endocrine: Negative.   Genitourinary: Negative.   Musculoskeletal: Negative.   Skin: Negative.   Neurological: Positive for numbness.       Right arm and hand  Hematological: Negative.   Psychiatric/Behavioral: Negative.    Past Medical History  Diagnosis Date  . Hypertension   . Diabetes mellitus   . Migraine   . History of chicken pox   . History of measles, mumps, or rubella   . Hyperlipidemia   . Osteoarthritis   . PMB (postmenopausal bleeding) 02/28/2009    Resolved  . Vaginal atrophy   . Vitamin D deficiency   . Mitral valve prolapse     History   Social History  . Marital Status: Divorced    Spouse Name: N/A    Number of Children: N/A  . Years of Education: N/A   Occupational History  . Not on file.   Social History Main Topics  . Smoking status: Never Smoker   . Smokeless tobacco: Not on file  . Alcohol Use: No  . Drug Use: No  . Sexually Active: Not on file   Other Topics Concern  . Not on file   Social History Narrative  . No narrative on file    Past Surgical History  Procedure Laterality Date  . Cesarean section  1972 &1976  . Tubal ligation   1976  . Replacement total knee  2000    left  . Dilation and curettage of uterus  1975    Family History  Problem Relation Age of Onset  . Hypertension Mother   . Diabetes Mother   . Heart disease Father   . Diabetes Brother     Allergies  Allergen Reactions  . Contrast Media (Iodinated Diagnostic Agents)   . Nsaids     Abnormal renal function  . Penicillins Rash    Current Outpatient Prescriptions on File Prior to Visit  Medication Sig Dispense Refill  . amLODipine (NORVASC) 10 MG tablet Take 10 mg by mouth daily.      [name] Kitchen aspirin 81 MG EC tablet Take 81 mg by mouth daily. Swallow whole.      [name] Kitchen co-enzyme Q-10 30 MG capsule Take 30 mg by mouth 2 (two) times daily.      [name] Kitchen estradiol-norethindrone (COMBIPATCH) 0.05-0.25 MG/DAY Place 1 patch onto the skin 2 (two) times a week.      . fish oil-omega-3 fatty acids 1000 MG capsule Take 2 g by mouth daily.      . furosemide (LASIX) 40 MG tablet Take 40 mg by mouth daily.      [name] Kitchen glucose blood test strip Check blood glucose twice daily and as needed  100 each  11  . Lancets MISC  1 Units by Does not apply route 2 (two) times daily as needed. One touch ultra soft lancets  100 each  111  . lisinopril (PRINIVIL,ZESTRIL) 10 MG tablet Take 10 mg by mouth daily.      . nebivolol (BYSTOLIC) 5 MG tablet Take 5 mg by mouth. Take 1 1/2 tab bid      . rosuvastatin (CRESTOR) 40 MG tablet Take 40 mg by mouth daily.      . TRADJENTA 5 MG TABS tablet TAKE 1 TABLET BY MOUTH EVERY DAY  30 tablet  5   No current facility-administered medications on file prior to visit.    BP 134/82  Pulse 75  Temp(Src) 98.5 F (36.9 C) (Oral)  Wt 191 lb (86.637 kg)  BMI 31.78 kg/m2  SpO2 97%chart    Objective:   Physical Exam  Constitutional: She is oriented to person, place, and time. She appears well-developed and well-nourished.  HENT:  Right Ear: External ear normal.  Left Ear: External ear normal.  Nose: Nose normal.  Mouth/Throat: Oropharynx is clear  and moist.  Neck: Normal range of motion. Neck supple. No thyromegaly present.  Cardiovascular: Normal rate, regular rhythm and normal heart sounds.   Pulmonary/Chest: Effort normal and breath sounds normal.  Abdominal: Soft. Bowel sounds are normal.  Musculoskeletal: Normal range of motion.  Neurological: She is alert and oriented to person, place, and time. She has normal reflexes.  Skin: Skin is warm and dry.  Psychiatric: She has a normal mood and affect.          Assessment & Plan:  Assessment:  1. Type 2 diabetes 2. Hypercholesterolemia 3. Hypertension 4. Carpal tunnel syndrome   Plan: A1c and lipids in today. I have a copy of her labs from nephrology double scanned into the chart. Continue current medications. Encouraged healthy diet and exercise. Continue night splints for carpal, syndrome and see neurology as scheduled. Bring patient back for recheck in 4 months and sooner as needed.

## 2013-01-16 NOTE — Patient Instructions (Signed)
Carpal Tunnel Syndrome The carpal tunnel is a narrow area located on the palm side of your wrist. The tunnel is formed by the wrist bones and ligaments. Nerves, blood vessels, and tendons pass through the carpal tunnel. Repeated wrist motion or certain diseases may cause swelling within the tunnel. This swelling pinches the main nerve in the wrist (median nerve) and causes the painful hand and arm condition called carpal tunnel syndrome. CAUSES   Repeated wrist motions.  Wrist injuries.  Certain diseases like arthritis, diabetes, alcoholism, hyperthyroidism, and kidney failure.  Obesity.  Pregnancy. SYMPTOMS   A "pins and needles" feeling in your fingers or hand.  Tingling or numbness in your fingers or hand.  An aching feeling in your entire arm.  Wrist pain that goes up your arm to your shoulder.  Pain that goes down into your palm or fingers.  A weak feeling in your hands. DIAGNOSIS  Your caregiver will take your history and perform a physical exam. An electromyography test may be needed. This test measures electrical signals sent out by the muscles. The electrical signals are usually slowed by carpal tunnel syndrome. You may also need X-rays. TREATMENT  Carpal tunnel syndrome may clear up by itself. Your caregiver may recommend a wrist splint or medicine such as a nonsteroidal anti-inflammatory medicine. Cortisone injections may help. Sometimes, surgery may be needed to free the pinched nerve.  HOME CARE INSTRUCTIONS   Take all medicine as directed by your caregiver. Only take over-the-counter or prescription medicines for pain, discomfort, or fever as directed by your caregiver.  If you were given a splint to keep your wrist from bending, wear it as directed. It is important to wear the splint at night. Wear the splint for as long as you have pain or numbness in your hand, arm, or wrist. This may take 1 to 2 months.  Rest your wrist from any activity that may be causing your  pain. If your symptoms are work-related, you may need to talk to your employer about changing to a job that does not require using your wrist.  Put ice on your wrist after long periods of wrist activity.  Put ice in a plastic bag.  Place a towel between your skin and the bag.  Leave the ice on for 15 to 20 minutes, 3 to 4 times a day.  Keep all follow-up visits as directed by your caregiver. This includes any orthopedic referrals, physical therapy, and rehabilitation. Any delay in getting necessary care could result in a delay or failure of your condition to heal. SEEK IMMEDIATE MEDICAL CARE IF:   You have new, unexplained symptoms.  Your symptoms get worse and are not helped or controlled with medicines. MAKE SURE YOU:   Understand these instructions.  Will watch your condition.  Will get help right away if you are not doing well or get worse. Document Released: 11/06/2000 Document Revised: 02/01/2012 Document Reviewed: 09/25/2011 [facility] Patient Information 2013 [place], [place].     Carpal Tunnel Release Carpal tunnel release is done to relieve the pressure on the nerves and tendons on the bottom side of your wrist.  LET YOUR CAREGIVER KNOW ABOUT:   Allergies to food or medicine.  Medicines taken, including vitamins, herbs, eyedrops, over-the-counter medicines, and creams.  Use of steroids (by mouth or creams).  Previous problems with anesthetics or numbing medicines.  History of bleeding problems or blood clots.  Previous surgery.  Other health problems, including diabetes and kidney problems.  Possibility of pregnancy,  if this applies. RISKS AND COMPLICATIONS  Some problems that may happen after this procedure include:  Infection.  Damage to the nerves, arteries or tendons could occur. This would be very uncommon.  Bleeding. BEFORE THE PROCEDURE   This surgery may be done while you are asleep (general anesthetic) or may be done under a block where only  your forearm and the surgical area is numb.  If the surgery is done under a block, the numbness will gradually wear off within several hours after surgery. HOME CARE INSTRUCTIONS   Have a responsible person with you for 24 hours.  Do not drive a car or use public transportation for 24 hours.  Only take over-the-counter or prescription medicines for pain, discomfort, or fever as directed by your caregiver. Take them as directed.  You may put ice on the palm side of the affected wrist.  Put ice in a plastic bag.  Place a towel between your skin and the bag.  Leave the ice on for 20 to 30 minutes, 4 times per day.  If you were given a splint to keep your wrist from bending, use it as directed. It is important to wear the splint at night or as directed. Use the splint for as long as you have pain or numbness in your hand, arm, or wrist. This may take 1 to 2 months.  Keep your hand raised (elevated) above the level of your heart as much as possible. This keeps swelling down and helps with discomfort.  Change bandages (dressings) as directed.  Keep the wound clean and dry. SEEK MEDICAL CARE IF:   You develop pain not relieved with medications.  You develop numbness of your hand.  You develop bleeding from your surgical site.  You have an oral temperature above 102 F (38.9 C).  You develop redness or swelling of the surgical site.  You develop new, unexplained problems. SEEK IMMEDIATE MEDICAL CARE IF:   You develop a rash.  You have difficulty breathing.  You develop any reaction or side effects to medications given. Document Released: 01/30/2004 Document Revised: 02/01/2012 Document Reviewed: 09/15/2007 [facility] Patient Information 2013 [place], [place].

## 2013-01-19 ENCOUNTER — Other Ambulatory Visit (HOSPITAL_COMMUNITY): Payer: Self-pay | Admitting: Endocrinology

## 2013-01-19 ENCOUNTER — Other Ambulatory Visit: Payer: Self-pay | Admitting: Endocrinology

## 2013-01-19 DIAGNOSIS — E049 Nontoxic goiter, unspecified: Secondary | ICD-10-CM

## 2013-01-19 DIAGNOSIS — E039 Hypothyroidism, unspecified: Secondary | ICD-10-CM

## 2013-01-19 DIAGNOSIS — IMO0001 Reserved for inherently not codable concepts without codable children: Secondary | ICD-10-CM | POA: Diagnosis not present

## 2013-01-19 DIAGNOSIS — I10 Essential (primary) hypertension: Secondary | ICD-10-CM | POA: Diagnosis not present

## 2013-01-24 ENCOUNTER — Ambulatory Visit
Admission: RE | Admit: 2013-01-24 | Discharge: 2013-01-24 | Disposition: A | Discharge status: Home / Self Care | Payer: Medicare Other | Source: Ambulatory Visit | Attending: Endocrinology | Admitting: Endocrinology

## 2013-01-24 DIAGNOSIS — E049 Nontoxic goiter, unspecified: Secondary | ICD-10-CM

## 2013-01-24 DIAGNOSIS — E042 Nontoxic multinodular goiter: Secondary | ICD-10-CM | POA: Diagnosis not present

## 2013-01-26 ENCOUNTER — Encounter (HOSPITAL_COMMUNITY)
Admission: RE | Admit: 2013-01-26 | Discharge: 2013-01-26 | Disposition: A | Discharge status: Home / Self Care | Payer: Medicare Other | Source: Ambulatory Visit | Attending: Endocrinology | Admitting: Endocrinology

## 2013-01-26 DIAGNOSIS — E039 Hypothyroidism, unspecified: Secondary | ICD-10-CM | POA: Insufficient documentation

## 2013-01-26 MED ORDER — TECHNETIUM TC 99M SESTAMIBI GENERIC - CARDIOLITE
25.0000 | Freq: Once | INTRAVENOUS | Status: AC | PRN
  Administered 2013-01-26: 25 via INTRAVENOUS

## 2013-01-30 DIAGNOSIS — G5600 Carpal tunnel syndrome, unspecified upper limb: Secondary | ICD-10-CM | POA: Diagnosis not present

## 2013-01-30 DIAGNOSIS — M19049 Primary osteoarthritis, unspecified hand: Secondary | ICD-10-CM | POA: Diagnosis not present

## 2013-02-02 DIAGNOSIS — E1139 Type 2 diabetes mellitus with other diabetic ophthalmic complication: Secondary | ICD-10-CM | POA: Diagnosis not present

## 2013-03-09 ENCOUNTER — Ambulatory Visit (INDEPENDENT_AMBULATORY_CARE_PROVIDER_SITE_OTHER): Payer: Medicare Other | Admitting: Surgery

## 2013-03-09 ENCOUNTER — Encounter (INDEPENDENT_AMBULATORY_CARE_PROVIDER_SITE_OTHER): Payer: Self-pay | Admitting: Surgery

## 2013-03-09 DIAGNOSIS — E042 Nontoxic multinodular goiter: Secondary | ICD-10-CM

## 2013-03-09 DIAGNOSIS — E559 Vitamin D deficiency, unspecified: Secondary | ICD-10-CM | POA: Insufficient documentation

## 2013-03-09 NOTE — Progress Notes (Signed)
General Surgery [facility]  Chief Complaint  Patient presents with  . New Evaluation    suspect primary hyperparathyroidism - referral from Dr. Dorisann Frames    HISTORY: Patient is a 79 year old black female referred by her endocrinologist for evaluation of possible primary hyperparathyroidism. Patient has a history of widely varying serum calcium levels. Over the past 3 years her calcium levels have ranged from 11.1-9.1 with her most recent level being elevated at 10.7. In February 2014 the patient had an intact PTH level of 62 with the range and that laboratory being normal from 15-65. TSH level was normal at 2.02. Upon review of her other laboratory studies, there is a low 25 hydroxy vitamin D level of 29.3 from February of 2013.  Patient has been followed by her endocrinologist, Dr. Dorisann Frames, and by her nephrologist, Dr. Fayrene Fearing Deterding.  Patient has no prior history of neck surgery. There is no family history of thyroid disease or parathyroid disease or other endocrinopathy. Patient has never been on thyroid medication.  Patient underwent nuclear medicine parathyroid scan on 01/26/2013 at [facility].  This study was negative for any evidence of parathyroid adenoma. Also, the patient has undergone sequential ultrasound scanning with her most recent study being in March 2014. This shows bilateral thyroid nodules with a dominant nodule in the lower left lobe measuring 2.0 cm which is clinically stable. Previous fine-needle aspiration-benign cytopathology consistent with nonneoplastic goiter.  Past Medical History  Diagnosis Date  . Hypertension   . Diabetes mellitus   . Migraine   . History of chicken pox   . History of measles, mumps, or rubella   . Hyperlipidemia   . Osteoarthritis   . PMB (postmenopausal bleeding) 02/28/2009    Resolved  . Vaginal atrophy   . Vitamin D deficiency   . Mitral valve prolapse      Current Outpatient Prescriptions   Medication Sig Dispense Refill  . amLODipine (NORVASC) 10 MG tablet Take 10 mg by mouth daily.      [name] Kitchen aspirin 81 MG EC tablet Take 81 mg by mouth daily. Swallow whole.      [name] Kitchen co-enzyme Q-10 30 MG capsule Take 30 mg by mouth 2 (two) times daily.      [name] Kitchen estradiol-norethindrone (COMBIPATCH) 0.05-0.25 MG/DAY Place 1 patch onto the skin 2 (two) times a week.      . fish oil-omega-3 fatty acids 1000 MG capsule Take 2 g by mouth daily.      . furosemide (LASIX) 40 MG tablet Take 40 mg by mouth daily.      [name] Kitchen glucose blood test strip 1 each by Other route as needed for other. Use as instructed      . l-methylfolate-b2-b6-b12 (CEREFOLIN) 04-23-49-5 MG TABS Take 1 tablet by mouth daily.      . Lancets Misc. KIT by Does not apply route.      [name] Kitchen lisinopril (PRINIVIL,ZESTRIL) 10 MG tablet Take 10 mg by mouth daily.      . nebivolol (BYSTOLIC) 5 MG tablet Take 5 mg by mouth. Take 1 1/2 tab bid      . rosuvastatin (CRESTOR) 40 MG tablet Take 40 mg by mouth daily.      . TRADJENTA 5 MG TABS tablet TAKE 1 TABLET BY MOUTH EVERY DAY  30 tablet  5   No current facility-administered medications for this visit.     Allergies  Allergen Reactions  . Contrast Media (Iodinated Diagnostic Agents)   .  Nsaids     Abnormal renal function  . Penicillins Rash     Family History  Problem Relation Age of Onset  . Hypertension Mother   . Diabetes Mother   . Heart disease Father   . Diabetes Brother   . Heart disease Brother   . Stroke Brother      History   Social History  . Marital Status: Divorced    Spouse Name: N/A    Number of Children: N/A  . Years of Education: N/A   Social History Main Topics  . Smoking status: Never Smoker   . Smokeless tobacco: None  . Alcohol Use: No  . Drug Use: No  . Sexually Active: None   Other Topics Concern  . None   Social History Narrative  . None     REVIEW OF SYSTEMS - PERTINENT POSITIVES ONLY: Denies tremor. Denies palpitations. Denies compressive  symptoms. Denies osteoporosis. Denies nephrolithiasis.  EXAM: Filed Vitals:   03/09/13 0952  BP: 140/70  Pulse: 78  Temp: 97.8 F (36.6 C)  Resp: 18    HEENT: normocephalic; pupils equal and reactive; sclerae clear; dentition good; mucous membranes moist NECK:  Mildly nodular thyroid lobes without discrete or dominant mass in the thyroid bed; symmetric on extension; no palpable anterior or posterior cervical lymphadenopathy; no supraclavicular masses; no tenderness CHEST: clear to auscultation bilaterally without rales, rhonchi, or wheezes CARDIAC: regular rate and rhythm without significant murmur; peripheral pulses are full EXT:  non-tender without edema; no deformity NEURO: no gross focal deficits; no sign of tremor   LABORATORY RESULTS: See [facility] ([facility]) for most recent results   RADIOLOGY RESULTS: See [facility] ([facility]pic) for most recent results   IMPRESSION: #1 hypercalcemia, rule out primary hyperparathyroidism #2 thyroid nodules, clinically stable #3 vitamin D deficiency  PLAN: I discussed the above results at length with the patient. At this point I do not think there is an indication for surgical intervention. I am going to use a different laboratory to repeat her calcium level, her PTH level, a 24-hour urine collection for calcium, and a 25 hydroxy vitamin D level. I will contact her with the results of these studies. We will then make a decision whether to treat her with vitamin D for a period of 3-6 months. We will also make a decision regarding further diagnostic studies such as a CT scan of the neck or an MRI scan of the neck.  Velora Heckler, MD, FACS General & Endocrine Surgery [facility]   Visit Diagnoses: 1. Hypercalcemia   2. Multiple thyroid nodules   3. Unspecified vitamin D deficiency     Primary Care Physician: Janell Quiet, FNP

## 2013-03-09 NOTE — Patient Instructions (Signed)
Parathyroid Hormone This is a test to determine whether PTH levels are responding normally to changes in blood calcium levels. It also helps to distinguish the cause of calcium imbalances, and to evaluate parathyroid function. When calcium blood levels are higher or lower than normal, and when your caregiver may want to determine how well your parathyroid glands are working. Parathyroid hormone (PTH) helps the body maintain stable levels of calcium in the blood. It is part of a 'feedback loop' that includes calcium, PTH, vitamin D, and, to some extent, phosphate and magnesium. Conditions and diseases that disrupt this feedback loop can cause inappropriate elevations or decreases in calcium and PTH levels and lead to symptoms of hypercalcemia (raised blood levels of calcium) or hypocalcemia (low blood levels of calcium).  PTH is produced by four parathyroid glands that are located in the neck beside the thyroid gland. Normally, these glands secrete PTH into the bloodstream in response to low blood calcium levels. Parathyroid hormone then works in three ways to help raise blood calcium levels back to normal. It takes calcium from the body's bone, stimulates the activation of vitamin D in the kidney (which in turn increases the absorption of calcium from the intestines), and suppresses the excretion of calcium in the urine (while encouraging excretion of phosphate). As calcium levels begin to increase in the blood, PTH normally decreases. PREPARATION FOR TEST You should have nothing to eat or drink except for water after midnight on the day of the test or as directed by your caregiver. A blood sample is obtained by inserting a needle into a vein in the arm. NORMAL FINDINGS Conventional Normal  PTH intact (whole)  Assay includes intact PTH  Values (pg/mL)10-65  SI Units (ng/L)10-65  PTH N-terminalN-terminal  Values (pg/mL) 8-24  SI Units (ng/L)8-24  PTH C-terminal  Assay Includes  C-terminal  Values (pg/mL) 50-330  SI Units (ng/L) 50-330  Intact PTH  Midmolecule Ranges for normal findings may vary among different laboratories and hospitals. You should always check with your doctor after having lab work or other tests done to discuss the meaning of your test results and whether your values are considered within normal limits. MEANING OF TEST  Your caregiver will go over the test results with you and discuss the importance and meaning of your results, as well as treatment options and the need for additional tests if necessary. OBTAINING THE TEST RESULTS  It is your responsibility to obtain your test results. Ask the lab or department performing the test when and how you will get your results. Document Released: 12/12/2004 Document Revised: 02/01/2012 Document Reviewed: 10/21/2008 [facility] Patient Information 2013 [place], [place].

## 2013-03-15 ENCOUNTER — Encounter (INDEPENDENT_AMBULATORY_CARE_PROVIDER_SITE_OTHER): Payer: Self-pay

## 2013-03-16 ENCOUNTER — Telehealth (INDEPENDENT_AMBULATORY_CARE_PROVIDER_SITE_OTHER): Payer: Self-pay

## 2013-03-16 NOTE — Telephone Encounter (Signed)
Lab results here and to Dr Gerrit Friends to review and advise what f/u pt will need.

## 2013-03-23 ENCOUNTER — Other Ambulatory Visit: Payer: Self-pay

## 2013-03-23 DIAGNOSIS — Z1231 Encounter for screening mammogram for malignant neoplasm of breast: Secondary | ICD-10-CM

## 2013-03-23 NOTE — Telephone Encounter (Signed)
Pt called for results; told her Dr. Gerrit Friends had them for review.  She further requested a copy of them be mailed to her for her records.

## 2013-03-28 DIAGNOSIS — N951 Menopausal and female climacteric states: Secondary | ICD-10-CM | POA: Diagnosis not present

## 2013-03-28 DIAGNOSIS — Z01419 Encounter for gynecological examination (general) (routine) without abnormal findings: Secondary | ICD-10-CM | POA: Diagnosis not present

## 2013-03-28 DIAGNOSIS — Z780 Asymptomatic menopausal state: Secondary | ICD-10-CM | POA: Diagnosis not present

## 2013-03-28 LAB — HM DEXA SCAN: HM Dexa Scan: NORMAL

## 2013-04-10 DIAGNOSIS — N950 Postmenopausal bleeding: Secondary | ICD-10-CM | POA: Diagnosis not present

## 2013-04-20 DIAGNOSIS — N183 Chronic kidney disease, stage 3 unspecified: Secondary | ICD-10-CM | POA: Diagnosis not present

## 2013-04-20 DIAGNOSIS — E119 Type 2 diabetes mellitus without complications: Secondary | ICD-10-CM | POA: Diagnosis not present

## 2013-04-20 DIAGNOSIS — I10 Essential (primary) hypertension: Secondary | ICD-10-CM | POA: Diagnosis not present

## 2013-04-20 DIAGNOSIS — N179 Acute kidney failure, unspecified: Secondary | ICD-10-CM | POA: Diagnosis not present

## 2013-04-20 DIAGNOSIS — I129 Hypertensive chronic kidney disease with stage 1 through stage 4 chronic kidney disease, or unspecified chronic kidney disease: Secondary | ICD-10-CM | POA: Diagnosis not present

## 2013-04-20 DIAGNOSIS — E213 Hyperparathyroidism, unspecified: Secondary | ICD-10-CM | POA: Diagnosis not present

## 2013-04-20 DIAGNOSIS — N2581 Secondary hyperparathyroidism of renal origin: Secondary | ICD-10-CM | POA: Diagnosis not present

## 2013-04-20 DIAGNOSIS — E785 Hyperlipidemia, unspecified: Secondary | ICD-10-CM | POA: Diagnosis not present

## 2013-04-26 DIAGNOSIS — N950 Postmenopausal bleeding: Secondary | ICD-10-CM | POA: Diagnosis not present

## 2013-04-26 DIAGNOSIS — Z96659 Presence of unspecified artificial knee joint: Secondary | ICD-10-CM | POA: Diagnosis not present

## 2013-04-26 DIAGNOSIS — N859 Noninflammatory disorder of uterus, unspecified: Secondary | ICD-10-CM | POA: Diagnosis not present

## 2013-04-28 ENCOUNTER — Other Ambulatory Visit: Payer: Self-pay | Admitting: Obstetrics and Gynecology

## 2013-05-01 ENCOUNTER — Ambulatory Visit
Admission: RE | Admit: 2013-05-01 | Discharge: 2013-05-01 | Disposition: A | Discharge status: Home / Self Care | Payer: Medicare Other | Source: Ambulatory Visit

## 2013-05-01 DIAGNOSIS — N183 Chronic kidney disease, stage 3 unspecified: Secondary | ICD-10-CM | POA: Diagnosis not present

## 2013-05-01 DIAGNOSIS — Z1231 Encounter for screening mammogram for malignant neoplasm of breast: Secondary | ICD-10-CM

## 2013-05-01 DIAGNOSIS — N2581 Secondary hyperparathyroidism of renal origin: Secondary | ICD-10-CM | POA: Diagnosis not present

## 2013-05-01 DIAGNOSIS — E119 Type 2 diabetes mellitus without complications: Secondary | ICD-10-CM | POA: Diagnosis not present

## 2013-05-02 ENCOUNTER — Encounter: Payer: Self-pay | Admitting: Family

## 2013-05-02 ENCOUNTER — Encounter (INDEPENDENT_AMBULATORY_CARE_PROVIDER_SITE_OTHER): Payer: Self-pay

## 2013-05-02 ENCOUNTER — Ambulatory Visit (INDEPENDENT_AMBULATORY_CARE_PROVIDER_SITE_OTHER): Payer: Medicare Other | Admitting: Family

## 2013-05-02 VITALS — BP 110/60 | HR 67 | Wt 189.0 lb

## 2013-05-02 DIAGNOSIS — I10 Essential (primary) hypertension: Secondary | ICD-10-CM | POA: Diagnosis not present

## 2013-05-02 DIAGNOSIS — E7800 Pure hypercholesterolemia, unspecified: Secondary | ICD-10-CM | POA: Diagnosis not present

## 2013-05-02 DIAGNOSIS — E119 Type 2 diabetes mellitus without complications: Secondary | ICD-10-CM | POA: Diagnosis not present

## 2013-05-02 LAB — CBC WITH DIFFERENTIAL/PLATELET
Basophils Absolute: 0 10*3/uL (ref 0.0–0.1)
Eosinophils Absolute: 0.4 10*3/uL (ref 0.0–0.7)
Hemoglobin: 12.2 g/dL (ref 12.0–15.0)
Lymphocytes Relative: 22 % (ref 12.0–46.0)
Lymphs Abs: 2.2 10*3/uL (ref 0.7–4.0)
MCHC: 33.9 g/dL (ref 30.0–36.0)
MCV: 100.2 fl — ABNORMAL HIGH (ref 78.0–100.0)
Monocytes Absolute: 0.9 10*3/uL (ref 0.1–1.0)
Neutro Abs: 6.4 10*3/uL (ref 1.4–7.7)
RDW: 13.1 % (ref 11.5–14.6)

## 2013-05-02 LAB — BASIC METABOLIC PANEL
Calcium: 10.7 mg/dL — ABNORMAL HIGH (ref 8.4–10.5)
Chloride: 104 mEq/L (ref 96–112)
Creatinine, Ser: 1.7 mg/dL — ABNORMAL HIGH (ref 0.4–1.2)
GFR: 36.48 mL/min — ABNORMAL LOW (ref >60.00)

## 2013-05-02 LAB — HEPATIC FUNCTION PANEL
Albumin: 3.9 g/dL (ref 3.5–5.2)
Total Bilirubin: 0.4 mg/dL (ref 0.3–1.2)

## 2013-05-02 LAB — HEMOGLOBIN A1C: Hgb A1c MFr Bld: 6.5 % (ref 4.6–6.5)

## 2013-05-02 NOTE — Progress Notes (Signed)
Subjective:    Patient ID: Bonnie Chavez, female    DOB: 01-Dec-1933, 79 y.o.   MRN: [id number]  HPI Pt is a 79 year old African American female who presents to PCP for surgical clearance, to have a hysterectomy performed on 05/15/2013; Pt states necessity of the procedure is r/t abnormal vaginal bleeding x 1 month ago; Pt denies any chest pain, SOB or other acute issues at this time.    Review of Systems  Constitutional: Negative.   HENT: Negative.   Eyes: Negative.   Respiratory: Negative.   Cardiovascular: Negative.   Gastrointestinal: Negative.   Endocrine: Negative.   Genitourinary: Negative.   Musculoskeletal: Negative.   Skin: Negative.   Allergic/Immunologic: Negative.   Neurological: Negative.   Hematological: Negative.   Psychiatric/Behavioral: Negative.    Past Medical History  Diagnosis Date  . Hypertension   . Diabetes mellitus   . Migraine   . History of chicken pox   . History of measles, mumps, or rubella   . Hyperlipidemia   . Osteoarthritis   . PMB (postmenopausal bleeding) 02/28/2009    Resolved  . Vaginal atrophy   . Vitamin D deficiency   . Mitral valve prolapse     History   Social History  . Marital Status: Divorced    Spouse Name: N/A    Number of Children: N/A  . Years of Education: N/A   Occupational History  . Not on file.   Social History Main Topics  . Smoking status: Never Smoker   . Smokeless tobacco: Not on file  . Alcohol Use: No  . Drug Use: No  . Sexually Active: Not on file   Other Topics Concern  . Not on file   Social History Narrative  . No narrative on file    Past Surgical History  Procedure Laterality Date  . Cesarean section  1972 &1976  . Tubal ligation  1976  . Replacement total knee  2000    left  . Dilation and curettage of uterus  1975  . Cholecystectomy  1991  . Hemorrhoid surgery  1997  . Knee surgery  2000 2011  . Shoulder surgery  2004 2010     Family History  Problem Relation Age of Onset   . Hypertension Mother   . Diabetes Mother   . Heart disease Father   . Diabetes Brother   . Heart disease Brother   . Stroke Brother     Allergies  Allergen Reactions  . Contrast Media (Iodinated Diagnostic Agents)   . Nsaids     Abnormal renal function  . Penicillins Rash    Current Outpatient Prescriptions on File Prior to Visit  Medication Sig Dispense Refill  . amLODipine (NORVASC) 10 MG tablet Take 10 mg by mouth daily.      [name] Kitchen aspirin 81 MG EC tablet Take 81 mg by mouth daily. Swallow whole.      [name] Kitchen co-enzyme Q-10 30 MG capsule Take 30 mg by mouth 2 (two) times daily.      [name] Kitchen estradiol-norethindrone (COMBIPATCH) 0.05-0.25 MG/DAY Place 1 patch onto the skin 2 (two) times a week.      . fish oil-omega-3 fatty acids 1000 MG capsule Take 2 g by mouth daily.      . furosemide (LASIX) 40 MG tablet Take 40 mg by mouth daily.      [name] Kitchen glucose blood test strip 1 each by Other route as needed for other. Use as instructed      .  l-methylfolate-b2-b6-b12 (CEREFOLIN) 04-23-49-5 MG TABS Take 1 tablet by mouth daily.      . Lancets Misc. KIT by Does not apply route.      [name] Kitchen lisinopril (PRINIVIL,ZESTRIL) 10 MG tablet Take 10 mg by mouth daily.      . nebivolol (BYSTOLIC) 5 MG tablet Take 5 mg by mouth. Take 1 1/2 tab bid      . rosuvastatin (CRESTOR) 40 MG tablet Take 40 mg by mouth daily.      . TRADJENTA 5 MG TABS tablet TAKE 1 TABLET BY MOUTH EVERY DAY  30 tablet  5   No current facility-administered medications on file prior to visit.    BP 110/60  Pulse 67  Wt 189 lb (85.73 kg)  BMI 32.43 kg/m2  SpO2 97%chart    Objective:   Physical Exam  Constitutional: She is oriented to person, place, and time. She appears well-developed and well-nourished.  HENT:  Head: Normocephalic and atraumatic.  Eyes: Pupils are equal, round, and reactive to light.  Neck: Normal range of motion.  Cardiovascular: Normal rate and regular rhythm.   Pulmonary/Chest: Effort normal and breath sounds normal.   Abdominal: Soft. Bowel sounds are normal.  Musculoskeletal: Normal range of motion.  Neurological: She is alert and oriented to person, place, and time.  Skin: Skin is warm and dry.          Assessment & Plan:  1.Diabetes 2. Hypertension 3. Hypercholesteremia 4.  Surgical Clearance  EKG and routine lab work ordered to assess patient's appropriateness for surgical procedure. Will follow up with pt once lab results obtained in order to clear for surgery.

## 2013-05-04 ENCOUNTER — Other Ambulatory Visit: Payer: Self-pay | Admitting: *Deleted

## 2013-05-04 ENCOUNTER — Telehealth: Payer: Self-pay

## 2013-05-04 ENCOUNTER — Telehealth: Payer: Self-pay | Admitting: Cardiovascular Disease

## 2013-05-04 MED ORDER — NEBIVOLOL HCL 5 MG PO TABS: 7.5000 mg | ORAL_TABLET | Freq: Two times a day (BID) | ORAL | Status: DC

## 2013-05-04 MED ORDER — NEBIVOLOL HCL 5 MG PO TABS: 5.0000 mg | ORAL_TABLET | Freq: Two times a day (BID) | ORAL | Status: DC

## 2013-05-04 NOTE — Telephone Encounter (Signed)
Malachi Bonds called from Dr. Deterding's office requesting pt's last office visit where lasix was changed.  Advised that a release of information was needed and she states pt is not in the office and she cannot provide a this time.  Malachi Bonds states pt told her she will not change medication until Dr. Darrick Penna is aware of the change.  Pls advise  Fax to [facility] at [phone].  Thanks!

## 2013-05-04 NOTE — Telephone Encounter (Signed)
D rKelly wrote her a prescription for Bystolic today-He has 2 different directions on it-Please call to give the correct one!

## 2013-05-05 NOTE — Telephone Encounter (Signed)
Lab results faxed to [facility]

## 2013-05-05 NOTE — Telephone Encounter (Signed)
Ok to fax. Lasix is affecting the kidneys. Sometimes backing back off lasix helps the kidneys to recover, then we can possibly go back up. Ultimately, it is the patient's decision. I can only make suggestions.

## 2013-05-09 ENCOUNTER — Encounter: Payer: Self-pay | Admitting: Cardiovascular Disease

## 2013-05-09 ENCOUNTER — Encounter (HOSPITAL_COMMUNITY): Payer: Self-pay

## 2013-05-10 ENCOUNTER — Encounter (HOSPITAL_COMMUNITY): Payer: Self-pay

## 2013-05-10 ENCOUNTER — Encounter (HOSPITAL_COMMUNITY)
Admission: RE | Admit: 2013-05-10 | Discharge: 2013-05-10 | Disposition: A | Discharge status: Home / Self Care | Payer: Medicare Other | Source: Ambulatory Visit | Attending: Obstetrics and Gynecology | Admitting: Obstetrics and Gynecology

## 2013-05-10 DIAGNOSIS — I059 Rheumatic mitral valve disease, unspecified: Secondary | ICD-10-CM | POA: Diagnosis not present

## 2013-05-10 DIAGNOSIS — E119 Type 2 diabetes mellitus without complications: Secondary | ICD-10-CM | POA: Diagnosis not present

## 2013-05-10 DIAGNOSIS — I10 Essential (primary) hypertension: Secondary | ICD-10-CM | POA: Diagnosis not present

## 2013-05-10 DIAGNOSIS — Z79890 Hormone replacement therapy (postmenopausal): Secondary | ICD-10-CM | POA: Diagnosis not present

## 2013-05-10 DIAGNOSIS — N950 Postmenopausal bleeding: Secondary | ICD-10-CM | POA: Diagnosis not present

## 2013-05-10 LAB — CBC
HCT: 37.6 % (ref 36.0–46.0)
MCV: 97.2 fL (ref 78.0–100.0)
RBC: 3.87 MIL/uL (ref 3.87–5.11)
WBC: 9.5 10*3/uL (ref 4.0–10.5)

## 2013-05-10 LAB — BASIC METABOLIC PANEL
BUN: 38 mg/dL — ABNORMAL HIGH (ref 6–23)
CO2: 25 mEq/L (ref 19–32)
Chloride: 97 mEq/L (ref 96–112)
Creatinine, Ser: 1.66 mg/dL — ABNORMAL HIGH (ref 0.50–1.10)
Potassium: 4.3 mEq/L (ref 3.5–5.1)

## 2013-05-10 NOTE — Patient Instructions (Addendum)
20 Hong Timm  05/10/2013   Your procedure is scheduled on:  05/15/13  Enter through the Main Entrance of [facility] at 8 AM.  Pick up the phone at the desk and dial [phone].   Call this number if you have problems the morning of surgery: [phone]   Remember:   Do not eat food:After Midnight.  Do not drink clear liquids: After Midnight.  Take these medicines the morning of surgery with A SIP OF WATER: Bystolic   Do not wear jewelry, make-up or nail polish.  Do not wear lotions, powders, or perfumes. You may wear deodorant.  Do not shave 48 hours prior to surgery.  Do not bring valuables to the hospital.  [facility] is not responsible                  for any belongings or valuables brought to the hospital.  Contacts, dentures or bridgework may not be worn into surgery.  Leave suitcase in the car. After surgery it may be brought to your room.  For patients admitted to the hospital, checkout time is 11:00 AM the day of                discharge.   Patients discharged the day of surgery will not be allowed to drive                   home.  Name and phone number of your driver: son  Special Instructions: Shower using CHG 2 nights before surgery and the night before surgery.  If you shower the day of surgery use CHG.  Use special wash - you have one bottle of CHG for all showers.  You should use approximately 1/3 of the bottle for each shower.   Please read over the following fact sheets that you were given: Surgical Site Infection Prevention

## 2013-05-11 ENCOUNTER — Other Ambulatory Visit: Payer: Self-pay | Admitting: Obstetrics and Gynecology

## 2013-05-15 ENCOUNTER — Ambulatory Visit (HOSPITAL_COMMUNITY): Payer: Medicare Other | Admitting: Anesthesiology

## 2013-05-15 ENCOUNTER — Ambulatory Visit (HOSPITAL_COMMUNITY)
Admission: RE | Admit: 2013-05-15 | Discharge: 2013-05-15 | Disposition: A | Discharge status: Home / Self Care | Payer: Medicare Other | Source: Ambulatory Visit | Attending: Obstetrics and Gynecology | Admitting: Obstetrics and Gynecology

## 2013-05-15 ENCOUNTER — Encounter (HOSPITAL_COMMUNITY): Payer: Self-pay | Admitting: Anesthesiology

## 2013-05-15 ENCOUNTER — Encounter (HOSPITAL_COMMUNITY)
Admission: RE | Disposition: A | Discharge status: Home / Self Care | Payer: Self-pay | Source: Ambulatory Visit | Attending: Obstetrics and Gynecology

## 2013-05-15 ENCOUNTER — Encounter (HOSPITAL_COMMUNITY): Payer: Self-pay | Admitting: *Deleted

## 2013-05-15 DIAGNOSIS — Z79890 Hormone replacement therapy (postmenopausal): Secondary | ICD-10-CM | POA: Diagnosis not present

## 2013-05-15 DIAGNOSIS — E119 Type 2 diabetes mellitus without complications: Secondary | ICD-10-CM | POA: Insufficient documentation

## 2013-05-15 DIAGNOSIS — I10 Essential (primary) hypertension: Secondary | ICD-10-CM | POA: Diagnosis not present

## 2013-05-15 DIAGNOSIS — N950 Postmenopausal bleeding: Secondary | ICD-10-CM | POA: Diagnosis not present

## 2013-05-15 DIAGNOSIS — I059 Rheumatic mitral valve disease, unspecified: Secondary | ICD-10-CM | POA: Diagnosis not present

## 2013-05-15 DIAGNOSIS — N840 Polyp of corpus uteri: Secondary | ICD-10-CM | POA: Diagnosis not present

## 2013-05-15 HISTORY — PX: DILATATION & CURRETTAGE/HYSTEROSCOPY WITH RESECTOCOPE: SHX5572

## 2013-05-15 LAB — GLUCOSE, CAPILLARY
Glucose-Capillary: 143 mg/dL — ABNORMAL HIGH (ref 70–99)
Glucose-Capillary: 122 mg/dL — ABNORMAL HIGH (ref 70–99)

## 2013-05-15 SURGERY — DILATATION & CURETTAGE/HYSTEROSCOPY WITH RESECTOCOPE
Anesthesia: General

## 2013-05-15 MED ORDER — ONDANSETRON HCL 4 MG/2ML IJ SOLN: 4.0000 mg | Freq: Once | INTRAMUSCULAR | Status: DC | PRN

## 2013-05-15 MED ORDER — ACETAMINOPHEN 500 MG PO TABS
ORAL_TABLET | ORAL | Status: AC
  Filled 2013-05-15: qty 1

## 2013-05-15 MED ORDER — MEPERIDINE HCL 25 MG/ML IJ SOLN: 6.2500 mg | INTRAMUSCULAR | Status: DC | PRN

## 2013-05-15 MED ORDER — PROPOFOL 10 MG/ML IV BOLUS
INTRAVENOUS | Status: DC | PRN
  Administered 2013-05-15: 200 mg via INTRAVENOUS

## 2013-05-15 MED ORDER — PROPOFOL 10 MG/ML IV EMUL
INTRAVENOUS | Status: AC
  Filled 2013-05-15: qty 20

## 2013-05-15 MED ORDER — LIDOCAINE HCL 2 % IJ SOLN
INTRAMUSCULAR | Status: DC | PRN
  Administered 2013-05-15: 10 mL

## 2013-05-15 MED ORDER — SILVER NITRATE-POT NITRATE 75-25 % EX MISC
CUTANEOUS | Status: AC
  Filled 2013-05-15: qty 1

## 2013-05-15 MED ORDER — MIDAZOLAM HCL 5 MG/5ML IJ SOLN
INTRAMUSCULAR | Status: DC | PRN
  Administered 2013-05-15: 2 mg via INTRAVENOUS

## 2013-05-15 MED ORDER — LACTATED RINGERS IV SOLN
INTRAVENOUS | Status: DC
  Administered 2013-05-15 (×2): via INTRAVENOUS

## 2013-05-15 MED ORDER — PHENYLEPHRINE 40 MCG/ML (10ML) SYRINGE FOR IV PUSH (FOR BLOOD PRESSURE SUPPORT)
PREFILLED_SYRINGE | INTRAVENOUS | Status: AC
  Filled 2013-05-15: qty 5

## 2013-05-15 MED ORDER — LIDOCAINE HCL (CARDIAC) 20 MG/ML IV SOLN
INTRAVENOUS | Status: AC
  Filled 2013-05-15: qty 5

## 2013-05-15 MED ORDER — FENTANYL CITRATE 0.05 MG/ML IJ SOLN
INTRAMUSCULAR | Status: AC
  Filled 2013-05-15: qty 2

## 2013-05-15 MED ORDER — NEOSTIGMINE METHYLSULFATE 1 MG/ML IJ SOLN
INTRAMUSCULAR | Status: AC
  Filled 2013-05-15: qty 1

## 2013-05-15 MED ORDER — ACETAMINOPHEN 500 MG PO TABS: 500.0000 mg | ORAL_TABLET | Freq: Four times a day (QID) | ORAL | Status: DC | PRN

## 2013-05-15 MED ORDER — LIDOCAINE HCL 2 % IJ SOLN
INTRAMUSCULAR | Status: AC
  Filled 2013-05-15: qty 20

## 2013-05-15 MED ORDER — ONDANSETRON HCL 4 MG/2ML IJ SOLN
INTRAMUSCULAR | Status: AC
  Filled 2013-05-15: qty 2

## 2013-05-15 MED ORDER — DEXAMETHASONE SODIUM PHOSPHATE 10 MG/ML IJ SOLN
INTRAMUSCULAR | Status: AC
  Filled 2013-05-15: qty 1

## 2013-05-15 MED ORDER — FENTANYL CITRATE 0.05 MG/ML IJ SOLN
25.0000 ug | INTRAMUSCULAR | Status: DC | PRN
  Administered 2013-05-15 (×2): 50 ug via INTRAVENOUS

## 2013-05-15 MED ORDER — GLYCOPYRROLATE 0.2 MG/ML IJ SOLN
INTRAMUSCULAR | Status: DC | PRN
  Administered 2013-05-15 (×2): 0.2 mg via INTRAVENOUS

## 2013-05-15 MED ORDER — LIDOCAINE HCL (CARDIAC) 20 MG/ML IV SOLN
INTRAVENOUS | Status: DC | PRN
  Administered 2013-05-15: 60 mg via INTRAVENOUS

## 2013-05-15 MED ORDER — GLYCOPYRROLATE 0.2 MG/ML IJ SOLN
INTRAMUSCULAR | Status: AC
  Filled 2013-05-15: qty 4

## 2013-05-15 MED ORDER — PHENYLEPHRINE HCL 10 MG/ML IJ SOLN
INTRAMUSCULAR | Status: DC | PRN
  Administered 2013-05-15 (×3): 80 ug via INTRAVENOUS

## 2013-05-15 MED ORDER — ONDANSETRON HCL 4 MG/2ML IJ SOLN
INTRAMUSCULAR | Status: DC | PRN
  Administered 2013-05-15: 4 mg via INTRAVENOUS

## 2013-05-15 MED ORDER — FENTANYL CITRATE 0.05 MG/ML IJ SOLN
INTRAMUSCULAR | Status: DC | PRN
  Administered 2013-05-15 (×2): 50 ug via INTRAVENOUS

## 2013-05-15 MED ORDER — MIDAZOLAM HCL 2 MG/2ML IJ SOLN
INTRAMUSCULAR | Status: AC
  Filled 2013-05-15: qty 2

## 2013-05-15 MED ORDER — ACETAMINOPHEN 500 MG PO TABS
500.0000 mg | ORAL_TABLET | Freq: Four times a day (QID) | ORAL | Status: AC | PRN
  Administered 2013-05-15: 500 mg via ORAL

## 2013-05-15 MED ORDER — HYDROCODONE-ACETAMINOPHEN 2.5-325 MG PO TABS: 1.0000 | ORAL_TABLET | ORAL | Status: DC | PRN

## 2013-05-15 MED ORDER — CEFAZOLIN SODIUM-DEXTROSE 2-3 GM-% IV SOLR
INTRAVENOUS | Status: AC
  Filled 2013-05-15: qty 50

## 2013-05-15 MED ORDER — CEFAZOLIN SODIUM-DEXTROSE 2-3 GM-% IV SOLR
INTRAVENOUS | Status: DC | PRN
  Administered 2013-05-15: 2 g via INTRAVENOUS

## 2013-05-15 SURGICAL SUPPLY — 24 items
BOOTIES KNEE HIGH SLOAN (MISCELLANEOUS) ×4 IMPLANT
CANISTER SUCTION 2500CC (MISCELLANEOUS) ×2 IMPLANT
CATH ROBINSON RED A/P 16FR (CATHETERS) ×2 IMPLANT
CLOTH BEACON ORANGE TIMEOUT ST (SAFETY) ×2 IMPLANT
CONTAINER PREFILL 10% NBF 60ML (FORM) ×4 IMPLANT
CORD ACTIVE DISPOSABLE (ELECTRODE) ×1
CORD ELECTRO ACTIVE DISP (ELECTRODE) ×1 IMPLANT
DILATOR CANAL MILEX (MISCELLANEOUS) IMPLANT
DRESSING TELFA 8X3 (GAUZE/BANDAGES/DRESSINGS) ×2 IMPLANT
ELECT LOOP GYNE PRO 24FR (CUTTING LOOP) ×2
ELECT REM PT RETURN 9FT ADLT (ELECTROSURGICAL) ×2
ELECT VAPORTRODE GRVD BAR (ELECTRODE) IMPLANT
ELECTRODE LOOP GYNE PRO 24FR (CUTTING LOOP) ×1 IMPLANT
ELECTRODE REM PT RTRN 9FT ADLT (ELECTROSURGICAL) ×1 IMPLANT
ELECTRODE ROLLER VERSAPOINT (ELECTRODE) IMPLANT
ELECTRODE RT ANGLE VERSAPOINT (CUTTING LOOP) IMPLANT
ELECTRODE TWIZZLE TIP (MISCELLANEOUS) IMPLANT
GLOVE SURG SS PI 6.5 STRL IVOR (GLOVE) ×6 IMPLANT
GOWN STRL REIN XL XLG (GOWN DISPOSABLE) ×4 IMPLANT
LOOP ANGLED CUTTING 22FR (CUTTING LOOP) IMPLANT
PACK HYSTEROSCOPY LF (CUSTOM PROCEDURE TRAY) ×2 IMPLANT
PAD OB MATERNITY 4.3X12.25 (PERSONAL CARE ITEMS) ×2 IMPLANT
TOWEL OR 17X24 6PK STRL BLUE (TOWEL DISPOSABLE) ×4 IMPLANT
WATER STERILE IRR 1000ML POUR (IV SOLUTION) ×2 IMPLANT

## 2013-05-15 NOTE — Transfer of Care (Cosign Needed)
Immediate Anesthesia Transfer of Care Note  Patient: Bonnie Chavez  Procedure(s) Performed: Procedure(s): DILATATION & CURETTAGE/HYSTEROSCOPY WITH RESECTOCOPE (N/A)  Patient Location: PACU  Anesthesia Type:General  Level of Consciousness: awake, alert  and oriented  Airway & Oxygen Therapy: Patient Spontanous Breathing and Patient connected to nasal cannula oxygen  Post-op Assessment: Report given to PACU RN, Post -op Vital signs reviewed and stable and Patient moving all extremities  Post vital signs: Reviewed and stable  Complications: No apparent anesthesia complications

## 2013-05-15 NOTE — Op Note (Signed)
05/15/2013  11:09 AM  PATIENT:  Bonnie Chavez  79 y.o. female  PRE-OPERATIVE DIAGNOSIS:  Post Menopausal Bleeding  POST-OPERATIVE DIAGNOSIS:  Post Menopausal Bleeding  PROCEDURE:  Procedure(s): DILATATION & CURETTAGE/HYSTEROSCOPY WITH RESECTOCOPE  SURGEON:  Brandan Glauber P, MD  ASSISTANTS: None  ANESTHESIA:   local and MAC  ESTIMATED BLOOD LOSS: * No blood loss amount entered *   BLOOD ADMINISTERED:none  COMPLICATIONS: None  FINDINGS: The uterus sounded to 4-1/2 inches. The endometrial cavity essentially atrophic. There was a small endometrial lesion anterior to the left tubal ostium . Most consistent with a uterine fibroid which was entirely submucosal and intracavitary. There was a moderate amount of clots present in the endometrial cavity.  FLUID DEFICIT: Less than 150 cc  LOCAL MEDICATIONS USED:  XYLOCAINE  and Amount: 10 ml  SPECIMEN:  Source of Specimen:  Endometrial curetting and endometrial lesion  DISPOSITION OF SPECIMEN:  PATHOLOGY  COUNTS:  YES  DESCRIPTION OF PROCEDURE:the patient was taken to the operating room after appropriate identification and placed on the operating table. After the attainment of adequate general anesthesia she was placed in the lithotomy position. The perineum and vagina were prepped with multiple layers of Betadine. The bladder was emptied with a an in and out catheter. The perineum was draped in sterile field. A gray speculum was placed in the vagina. The cervix was grasped with a single-tooth tenaculum. A paracervical block was achieved with a total of 10 cc of 2% Xylocaine and the 5 and 7:00 positions. The uterus was sounded.  The cervix was then dilated to accommodate the diagnostic hysteroscope. The hysteroscope was used to evaluate all quadrants of the uterus. The operative hysteroscope was then placed and the electrosurgical loop used to excise the aforementioned endometrial lesion. The endometrial cavity was curetted in all  quadrants. Documentation of a clear endometrial cavity was made. All instruments were then removed from the vagina and the patient was awakened from general anesthesia and taken to the recovery room in satisfactory condition having tolerated the procedure well sponge and instrument counts correct.  PLAN OF CARE: Discharge home.  PATIENT DISPOSITION:  PACU - hemodynamically stable.   Delay start of Pharmacological VTE agent (>24hrs) due to surgical blood loss or risk of bleeding:  not applicable. SCD hose used during the operative procedure   Aneyah Lortz P, MD 11:09 AM

## 2013-05-15 NOTE — Preoperative (Signed)
Beta Blockers   Reason not to administer Beta Blockers:B blocker taken this am per pt

## 2013-05-15 NOTE — Anesthesia Preprocedure Evaluation (Addendum)
Anesthesia Evaluation  Patient identified by MRN, date of birth, ID band Patient awake    Reviewed: Allergy & Precautions, H&P , Patient's Chart, lab work & pertinent test results, reviewed documented beta blocker date and time   Airway Mallampati: II TM Distance: >3 FB Neck ROM: full    Dental no notable dental hx.    Pulmonary  breath sounds clear to auscultation  Pulmonary exam normal       Cardiovascular hypertension, On Medications Rhythm:regular Rate:Normal     Neuro/Psych    GI/Hepatic   Endo/Other  diabetes, Type 2  Renal/GU      Musculoskeletal   Abdominal   Peds  Hematology   Anesthesia Other Findings   Reproductive/Obstetrics                           Anesthesia Physical Anesthesia Plan  ASA: III  Anesthesia Plan: General   Post-op Pain Management:    Induction: Intravenous  Airway Management Planned: LMA  Additional Equipment:   Intra-op Plan:   Post-operative Plan:   Informed Consent: I have reviewed the patients History and Physical, chart, labs and discussed the procedure including the risks, benefits and alternatives for the proposed anesthesia with the patient or authorized representative who has indicated his/her understanding and acceptance.   Dental Advisory Given  Plan Discussed with: CRNA and Surgeon  Anesthesia Plan Comments: (  Discussed  general anesthesia, including possible nausea, instrumentation of airway, sore throat,pulmonary aspiration, etc. I asked if the were any outstanding questions, or  concerns before we proceeded. )        Anesthesia Quick Evaluation

## 2013-05-15 NOTE — H&P (Signed)
Bonnie Chavez is an 79 y.o. female. The patient presents for evaluation of postmenopausal bleeding, with an apparent endometrial polyp on sonohysterogram. She has a previous history of endometrial polyp treated in 2000 and tan with hysteroscopy. D&C with benign pathology. She continues to use her CombiPatch and prior to this time. Had had no bleeding since 2010. She became menopausal in 1996.  Pertinent Gynecological History: Menses: post-menopausal Bleeding: post menopausal bleeding Contraception: tubal ligation DES exposure: unknown Blood transfusions: none Sexually transmitted diseases: no past history Previous GYN Procedures: DNC  Last mammogram: normal Date: 2014 OB History: G2, P2   Menstrual History: The patient is postmenopausal    Past Medical History  Diagnosis Date  . Hypertension   . Diabetes mellitus   . History of chicken pox   . History of measles, mumps, or rubella   . Hyperlipidemia   . Osteoarthritis   . PMB (postmenopausal bleeding) 02/28/2009    Resolved  . Vaginal atrophy   . Vitamin D deficiency   . Mitral valve prolapse   . Migraine     no longer has migraines (80's)    Past Surgical History  Procedure Laterality Date  . Cesarean section  1972 &1976  . Tubal ligation  1976  . Replacement total knee  2000    left  . Dilation and curettage of uterus  1975  . Cholecystectomy  1991  . Hemorrhoid surgery  1997  . Knee surgery  2000 2011  . Shoulder surgery  2004 2010     Family History  Problem Relation Age of Onset  . Hypertension Mother   . Diabetes Mother   . Heart disease Father   . Diabetes Brother   . Heart disease Brother   . Stroke Brother     Social History:  reports that she has never smoked. She does not have any smokeless tobacco history on file. She reports that she does not drink alcohol or use illicit drugs.  Allergies:  Allergies  Allergen Reactions  . Contrast Media (Iodinated Diagnostic Agents)   . Nsaids     Abnormal  renal function  . Penicillins Rash    Pt has taken Keflex without difficulty    Prescriptions prior to admission  Medication Sig Dispense Refill  . amLODipine (NORVASC) 10 MG tablet Take 10 mg by mouth at bedtime.       [name] Kitchen aspirin (ECOTRIN LOW STRENGTH) 81 MG EC tablet Take 81 mg by mouth daily. Swallow whole.      . Coenzyme Q10 (CO Q 10) 100 MG CAPS Take 100 mg by mouth 2 (two) times daily.      [name] Kitchen estradiol-norethindrone (COMBIPATCH) 0.05-0.25 MG/DAY Place 1 patch onto the skin 2 (two) times a week. Tuesday and Saturday      . furosemide (LASIX) 20 MG tablet Take 40 mg by mouth daily.      [name] Kitchen lisinopril (PRINIVIL,ZESTRIL) 10 MG tablet Take 10 mg by mouth at bedtime.       . nebivolol (BYSTOLIC) 5 MG tablet Take 1.5 tablets (7.5 mg total) by mouth 2 (two) times daily. Take 1 1/2 tablets (7.5 mg) two times daily  30 tablet  4  . Omega-3 Fatty Acids (COROMEGA OMEGA 3 SQUEEZE PO) Take 1 packet by mouth once a week.      . pyridOXINE (VITAMIN B-6) 100 MG tablet Take 200 mg by mouth daily.      . rosuvastatin (CRESTOR) 40 MG tablet Take 40 mg by mouth at bedtime.       [name] Kitchen  TRADJENTA 5 MG TABS tablet TAKE 1 TABLET BY MOUTH EVERY DAY  30 tablet  5  . cephALEXin (KEFLEX) 500 MG capsule Take 500-1,000 mg by mouth as needed. For invasive procedures.  Takes 1 gram 1 hour prior to procedure, 500mg  6 hours after the procedure, 500mg  12 hours after the procedure      . glucose blood test strip 1 each by Other route as needed for other. Use as instructed      . Lancets Misc. KIT by Does not apply route.        Review of Systems  Constitutional: Negative.   HENT: Negative.   Eyes: Negative.   Respiratory: Negative.   Cardiovascular: Negative.   Gastrointestinal: Negative.   Genitourinary: Negative.   Musculoskeletal:       Arthritis  Skin: Negative.   Neurological: Negative.   Endo/Heme/Allergies: Negative.   Psychiatric/Behavioral: Negative.     Blood pressure 119/55, pulse 64, temperature 97.9  F (36.6 C), temperature source Oral, SpO2 100.00%. Physical Exam  Constitutional: She is oriented to person, place, and time. She appears well-developed and well-nourished.  HENT:  Head: Normocephalic and atraumatic.  Eyes: EOM are normal.  Neck: Normal range of motion. Neck supple.  Cardiovascular: Normal rate and regular rhythm.   Respiratory: Effort normal and breath sounds normal.  GI: Soft. Bowel sounds are normal.  Genitourinary: No vaginal discharge found.  Atrophic vaginal mucosa Cervix is atrophic and slightly stenotic Uterus was normal size, shape, and anterior and mobile Adnexa no masses  Musculoskeletal:  Limited range of motion in knee, now s/p replacement  Neurological: She is alert and oriented to person, place, and time.  Skin: Skin is warm and dry.  Psychiatric: She has a normal mood and affect.      Results for orders placed during the hospital encounter of 05/15/13 (from the past 24 hour(s))  GLUCOSE, CAPILLARY     Status: Abnormal   Collection Time    05/15/13  8:11 AM      Result Value Range   Glucose-Capillary 143 (*) 70 - 99 mg/dL    Recent Results (from the past 2160 hour(s))  CBC WITH DIFFERENTIAL     Status: Abnormal   Collection Time    05/02/13  3:33 PM      Result Value Range   WBC 9.9  4.5 - 10.5 K/uL   RBC 3.59 (*) 3.87 - 5.11 Mil/uL   Hemoglobin 12.2  12.0 - 15.0 g/dL   HCT 16.1  09.6 - 04.5 %   MCV 100.2 (*) 78.0 - 100.0 fl   MCHC 33.9  30.0 - 36.0 g/dL   RDW 40.9  81.1 - 91.4 %   Platelets 209.0  150.0 - 400.0 K/uL   Neutrophils Relative % 65.1  43.0 - 77.0 %   Lymphocytes Relative 22.0  12.0 - 46.0 %   Monocytes Relative 8.8  3.0 - 12.0 %   Eosinophils Relative 3.7  0.0 - 5.0 %   Basophils Relative 0.4  0.0 - 3.0 %   Neutro Abs 6.4  1.4 - 7.7 K/uL   Lymphs Abs 2.2  0.7 - 4.0 K/uL   Monocytes Absolute 0.9  0.1 - 1.0 K/uL   Eosinophils Absolute 0.4  0.0 - 0.7 K/uL   Basophils Absolute 0.0  0.0 - 0.1 K/uL  BASIC METABOLIC PANEL      Status: Abnormal   Collection Time    05/02/13  3:33 PM      Result Value Range  Sodium 136  135 - 145 mEq/L   Potassium 4.0  3.5 - 5.1 mEq/L   Chloride 104  96 - 112 mEq/L   CO2 24  19 - 32 mEq/L   Glucose, Bld 93  70 - 99 mg/dL   BUN 36 (*) 6 - 23 mg/dL   Creatinine, Ser 1.7 (*) 0.4 - 1.2 mg/dL   Calcium 81.1 (*) 8.4 - 10.5 mg/dL   GFR 91.47 (*) >82.95 mL/min  HEMOGLOBIN A1C     Status: None   Collection Time    05/02/13  3:33 PM      Result Value Range   Hemoglobin A1C 6.5  4.6 - 6.5 %   Comment: Glycemic Control Guidelines for People with Diabetes:Non Diabetic:  <6%Goal of Therapy: <7%Additional Action Suggested:  >8%   HEPATIC FUNCTION PANEL     Status: None   Collection Time    05/02/13  3:33 PM      Result Value Range   Total Bilirubin 0.4  0.3 - 1.2 mg/dL   Bilirubin, Direct 0.0  0.0 - 0.3 mg/dL   Alkaline Phosphatase 45  39 - 117 U/L   AST 23  0 - 37 U/L   ALT 15  0 - 35 U/L   Total Protein 8.0  6.0 - 8.3 g/dL   Albumin 3.9  3.5 - 5.2 g/dL  BASIC METABOLIC PANEL     Status: Abnormal   Collection Time    05/10/13 12:20 PM      Result Value Range   Sodium 134 (*) 135 - 145 mEq/L   Potassium 4.3  3.5 - 5.1 mEq/L   Chloride 97  96 - 112 mEq/L   CO2 25  19 - 32 mEq/L   Glucose, Bld 108 (*) 70 - 99 mg/dL   BUN 38 (*) 6 - 23 mg/dL   Creatinine, Ser 6.21 (*) 0.50 - 1.10 mg/dL   Calcium 30.8 (*) 8.4 - 10.5 mg/dL   GFR calc non Af Amer 29 (*) >90 mL/min   GFR calc Af Amer 33 (*) >90 mL/min   Comment:            The eGFR has been calculated     using the CKD EPI equation.     This calculation has not been     validated in all clinical     situations.     eGFR's persistently     <90 mL/min signify     possible Chronic Kidney Disease.  CBC     Status: None   Collection Time    05/10/13  3:25 PM      Result Value Range   WBC 9.5  4.0 - 10.5 K/uL   RBC 3.87  3.87 - 5.11 MIL/uL   Hemoglobin 12.9  12.0 - 15.0 g/dL   HCT 65.7  84.6 - 96.2 %   MCV 97.2  78.0 -  100.0 fL   MCH 33.3  26.0 - 34.0 pg   MCHC 34.3  30.0 - 36.0 g/dL   RDW 95.2  84.1 - 32.4 %   Platelets 206  150 - 400 K/uL  GLUCOSE, CAPILLARY     Status: Abnormal   Collection Time    05/15/13  8:11 AM      Result Value Range   Glucose-Capillary 143 (*) 70 - 99 mg/dL   Assessment/Plan: Recurrent postmenopausal bleeding. Probable recurrent endometrial polyp. Patient currently on hormone replacement therapy needed for disruptive vasomotor symptoms  Hysteroscopy, resection of polyp  and D&C have been recommended and accepted. The risks of anesthesia, bleeding, infection, damage to adjacent organs, and the particular risk of uterine perforation, have all been explained to the patient. She's had an opportunity to ask questions and wishes to proceed.  Hulda Reddix P 05/15/2013, 8:38 AM

## 2013-05-15 NOTE — Anesthesia Postprocedure Evaluation (Signed)
  Anesthesia Post-op Note  Patient: Bonnie Chavez  Procedure(s) Performed: Procedure(s): DILATATION & CURETTAGE/HYSTEROSCOPY WITH RESECTOCOPE (N/A) Patient is awake and responsive. Pain and nausea are reasonably well controlled. Vital signs are stable and clinically acceptable. Oxygen saturation is clinically acceptable. There are no apparent anesthetic complications at this time. Patient is ready for discharge.

## 2013-05-16 ENCOUNTER — Encounter (HOSPITAL_COMMUNITY): Payer: Self-pay | Admitting: Obstetrics and Gynecology

## 2013-05-16 ENCOUNTER — Ambulatory Visit: Payer: Medicare Other | Admitting: Family

## 2013-05-29 DIAGNOSIS — N859 Noninflammatory disorder of uterus, unspecified: Secondary | ICD-10-CM | POA: Diagnosis not present

## 2013-05-29 DIAGNOSIS — N950 Postmenopausal bleeding: Secondary | ICD-10-CM | POA: Diagnosis not present

## 2013-06-10 ENCOUNTER — Other Ambulatory Visit: Payer: Self-pay | Admitting: Cardiovascular Disease

## 2013-06-10 ENCOUNTER — Other Ambulatory Visit: Payer: Self-pay | Admitting: Family

## 2013-06-12 NOTE — Telephone Encounter (Signed)
Lisinopril refilled w/2 refills

## 2013-06-30 IMAGING — US US THYROID BIOPSY
1 series · 11 of 11 positions shown · non-contrast
Comparison: 12/03/2011

CLINICAL DATA: Dominant left inferior thyroid nodule

ULTRASOUND GUIDED NEEDLE ASPIRATE BIOPSY OF THE THYROID GLAND

[Series 1: us thyroid biopsy · 0.07mm/px · 11 acquisitions, 11 frames shown]
[im 1/11]
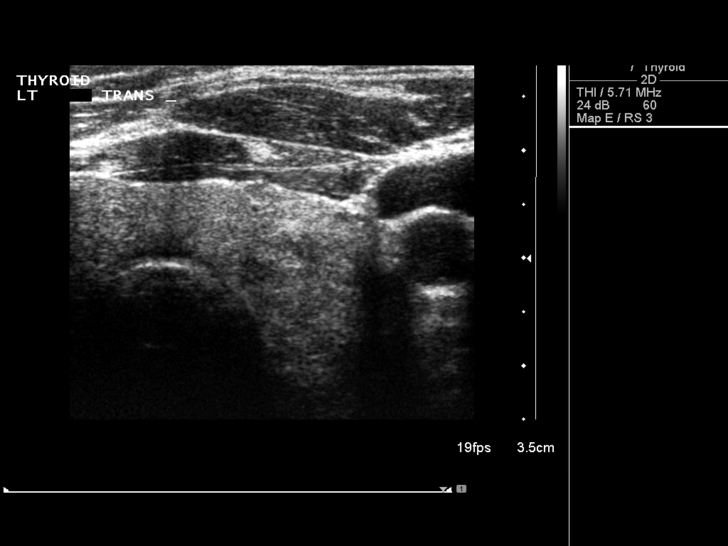
[im 2/11]
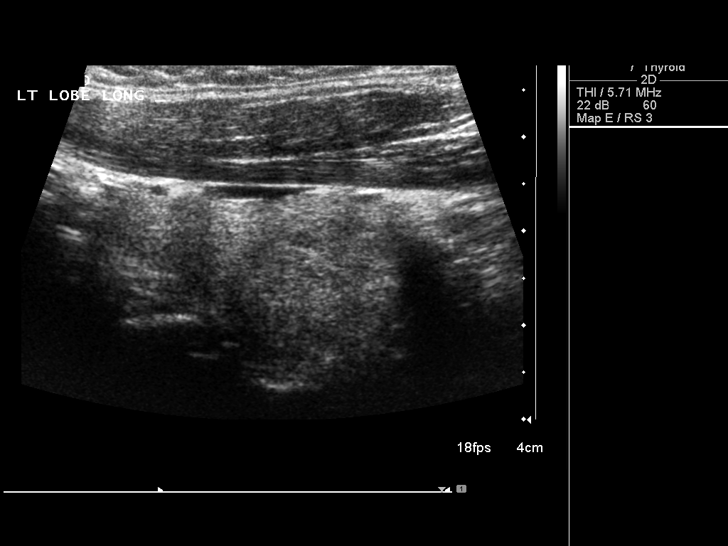
[im 3/11]
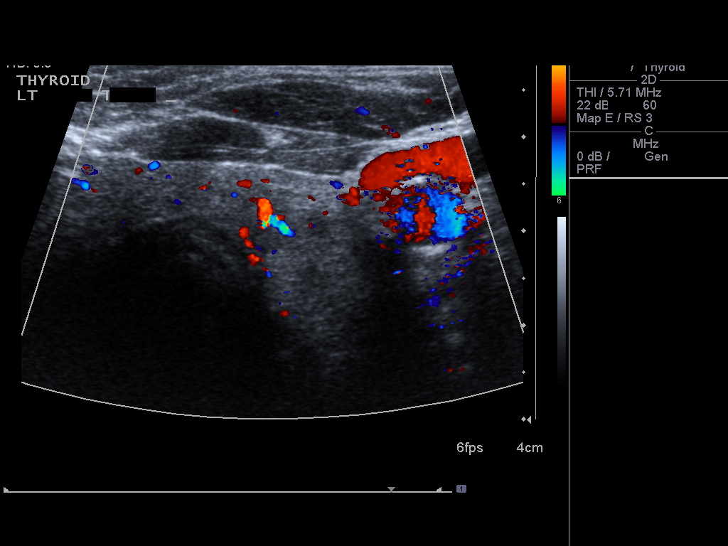
[im 4/11]
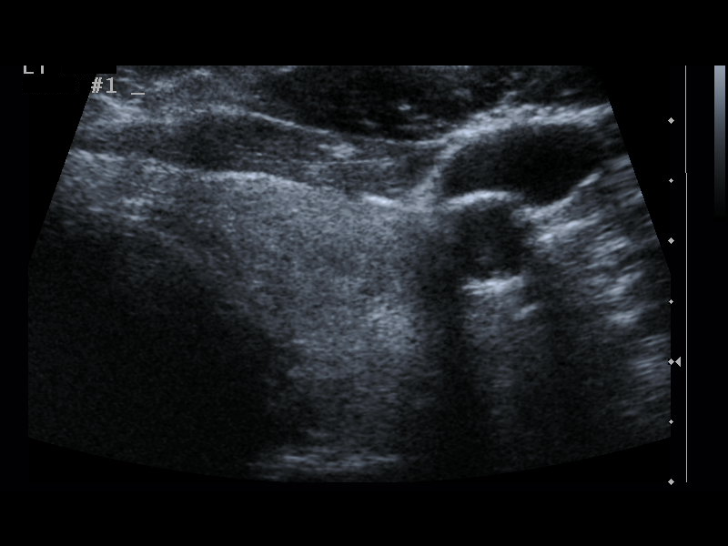
[im 5/11]
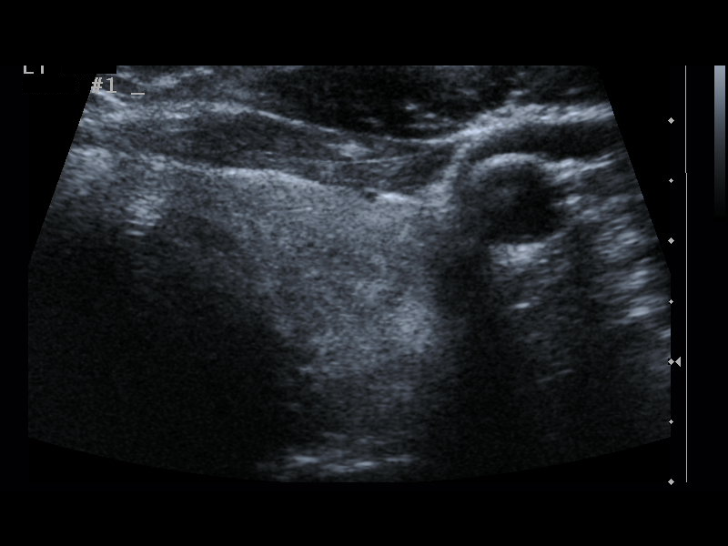
[im 6/11]
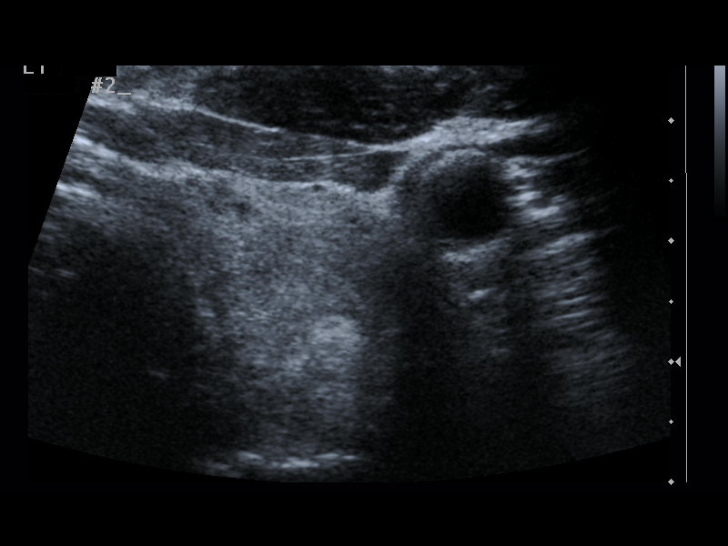
[im 7/11]
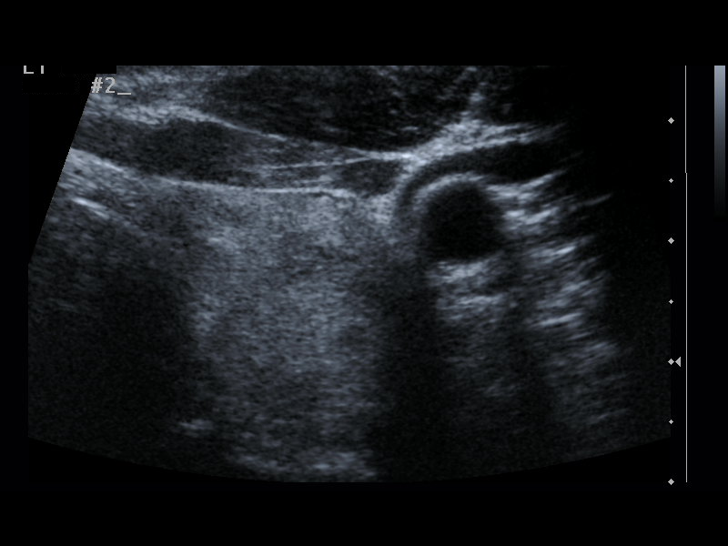
[im 8/11]
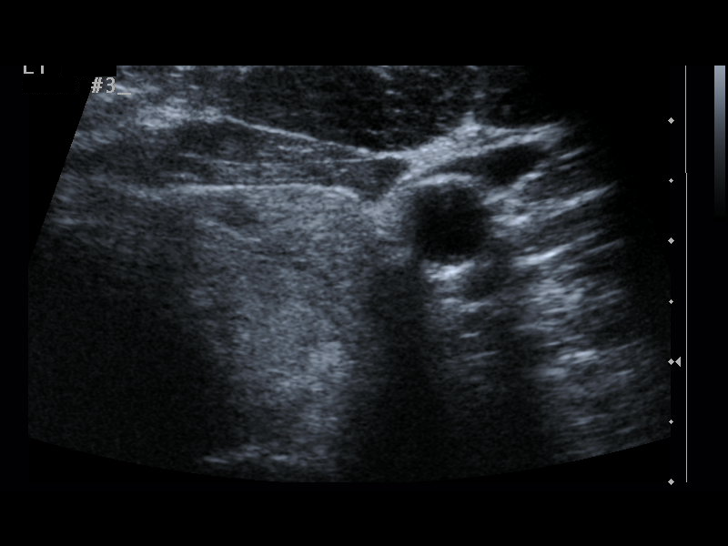
[im 9/11]
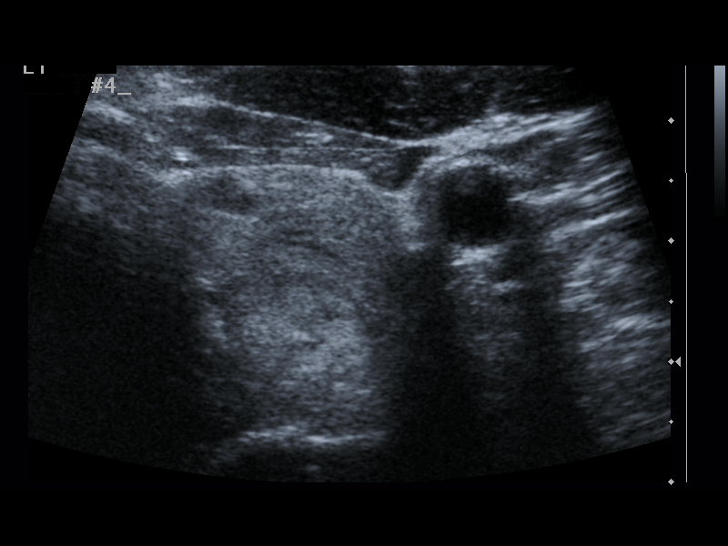
[im 10/11]
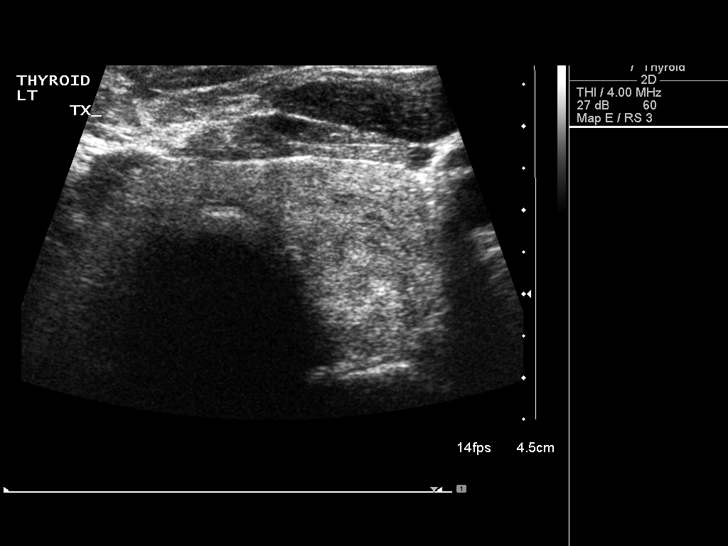
[im 11/11]
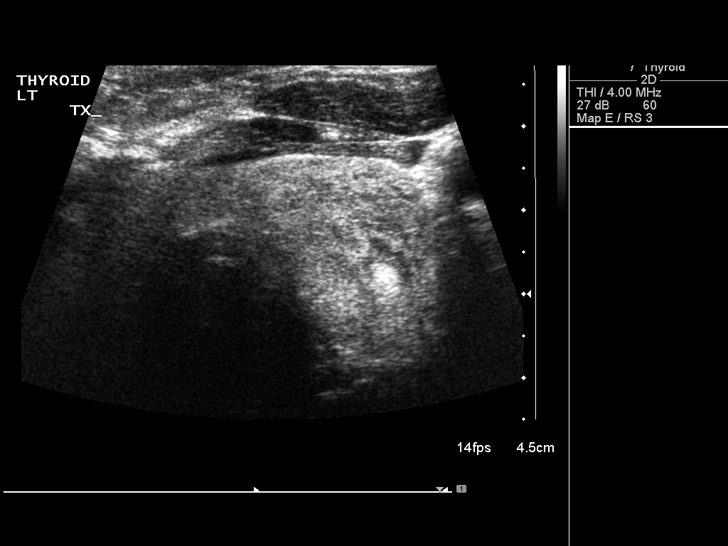

[11 of 11 positions shown; findings below may reference images not displayed]

Thyroid biopsy was thoroughly discussed with the patient and
questions were answered.  The benefits, risks, alternatives, and
complications were also discussed.  The patient understands and
wishes to proceed with the procedure.  Written consent was
obtained.

Ultrasound was performed to localize and mark an adequate site for
the biopsy.  The patient was then prepped and draped in a normal
sterile fashion.  Local anesthesia was provided with 1% lidocaine.
Using direct ultrasound guidance, 4 passes were made using 25 gauge
needles into the nodule within the  left lobe of the thyroid.
Ultrasound was used to confirm needle placements on all occasions.
Specimens were sent to Pathology for analysis.

Complications:  No immediate
FINDINGS: Imaging confirms needle placed in the dominant left
inferior thyroid nodule
IMPRESSION: Ultrasound guided needle aspirate biopsy performed of the dominant
left inferior thyroid nodule.

## 2013-07-11 DIAGNOSIS — N859 Noninflammatory disorder of uterus, unspecified: Secondary | ICD-10-CM | POA: Diagnosis not present

## 2013-07-11 DIAGNOSIS — N951 Menopausal and female climacteric states: Secondary | ICD-10-CM | POA: Diagnosis not present

## 2013-07-13 DIAGNOSIS — IMO0001 Reserved for inherently not codable concepts without codable children: Secondary | ICD-10-CM | POA: Diagnosis not present

## 2013-07-13 DIAGNOSIS — E049 Nontoxic goiter, unspecified: Secondary | ICD-10-CM | POA: Diagnosis not present

## 2013-07-17 ENCOUNTER — Other Ambulatory Visit: Payer: Self-pay | Admitting: Family

## 2013-07-19 ENCOUNTER — Telehealth: Payer: Self-pay | Admitting: Family

## 2013-07-19 DIAGNOSIS — I10 Essential (primary) hypertension: Secondary | ICD-10-CM | POA: Diagnosis not present

## 2013-07-19 DIAGNOSIS — IMO0001 Reserved for inherently not codable concepts without codable children: Secondary | ICD-10-CM | POA: Diagnosis not present

## 2013-07-19 DIAGNOSIS — E049 Nontoxic goiter, unspecified: Secondary | ICD-10-CM | POA: Diagnosis not present

## 2013-07-19 MED ORDER — GLUCOSE BLOOD VI STRP: ORAL_STRIP | Status: DC

## 2013-07-19 NOTE — Telephone Encounter (Signed)
Rx sent with note for need of appointment

## 2013-07-19 NOTE — Telephone Encounter (Signed)
PT is calling to request a year supply of her ONE TOUCH ULTRA TEST test strip be sent to [facility] on [address]. She is also questioning when she should come back and see you. Please assist.

## 2013-07-21 ENCOUNTER — Encounter: Payer: Self-pay | Admitting: *Deleted

## 2013-07-27 ENCOUNTER — Ambulatory Visit (INDEPENDENT_AMBULATORY_CARE_PROVIDER_SITE_OTHER): Payer: Medicare Other | Admitting: Cardiovascular Disease

## 2013-07-27 VITALS — BP 116/60 | HR 60 | Ht 64.5 in | Wt 188.8 lb

## 2013-07-27 DIAGNOSIS — I5189 Other ill-defined heart diseases: Secondary | ICD-10-CM

## 2013-07-27 DIAGNOSIS — I519 Heart disease, unspecified: Secondary | ICD-10-CM

## 2013-07-27 DIAGNOSIS — I119 Hypertensive heart disease without heart failure: Secondary | ICD-10-CM | POA: Diagnosis not present

## 2013-07-27 DIAGNOSIS — E785 Hyperlipidemia, unspecified: Secondary | ICD-10-CM

## 2013-07-27 DIAGNOSIS — I10 Essential (primary) hypertension: Secondary | ICD-10-CM

## 2013-07-27 NOTE — Patient Instructions (Addendum)
Your physician recommends that you schedule a follow-up appointment in: 1 YEAR. No changes were made today. 

## 2013-07-28 MED ORDER — AMLODIPINE BESYLATE 10 MG PO TABS: 10.0000 mg | ORAL_TABLET | Freq: Every day | ORAL | Status: DC

## 2013-07-28 MED ORDER — LISINOPRIL 10 MG PO TABS: ORAL_TABLET | ORAL | Status: DC

## 2013-07-28 MED ORDER — ROSUVASTATIN CALCIUM 40 MG PO TABS: 40.0000 mg | ORAL_TABLET | Freq: Every day | ORAL | Status: DC

## 2013-08-02 DIAGNOSIS — M25519 Pain in unspecified shoulder: Secondary | ICD-10-CM | POA: Diagnosis not present

## 2013-08-21 ENCOUNTER — Encounter: Payer: Self-pay | Admitting: Cardiovascular Disease

## 2013-08-21 DIAGNOSIS — I152 Hypertension secondary to endocrine disorders: Secondary | ICD-10-CM | POA: Insufficient documentation

## 2013-08-21 DIAGNOSIS — I10 Essential (primary) hypertension: Secondary | ICD-10-CM | POA: Insufficient documentation

## 2013-08-21 DIAGNOSIS — E785 Hyperlipidemia, unspecified: Secondary | ICD-10-CM | POA: Insufficient documentation

## 2013-08-21 DIAGNOSIS — E1159 Type 2 diabetes mellitus with other circulatory complications: Secondary | ICD-10-CM | POA: Insufficient documentation

## 2013-08-21 DIAGNOSIS — I5189 Other ill-defined heart diseases: Secondary | ICD-10-CM | POA: Insufficient documentation

## 2013-08-21 NOTE — Progress Notes (Signed)
Patient ID: Bonnie Chavez, female   DOB: 16-Aug-1934, 79 y.o.   MRN: [id number]     HPI: Bonnie Chavez, is a 79 y.o. female presents to the office today for a one-year cardiology evaluation.  Bonnie Chavez is a 79 year old African American female with a history of hypertension with documented grade 1 diastolic dysfunction, hyperlipidemia, type 2 diabetes mellitus, renal insufficiency, as well as aortic valve sclerosis without stenosis. Her last echo Doppler study in 2011 showed an EF of 55% to she also has a history of a quarter for which he sees Dr. Cleon Gustin. Sees Dr. Jesusita Oka again with reference to her renal insufficiency. The past year, she has done fairly well. She denies any episodes of chest pain or shortness of breath. In 2011 she did undergo a nuclear perfusion scan which showed normal perfusion and function apparently she has seen Dr. Pennie Rushing for uterine polyps and fibroids. She does note some mild edema particularly extending to her pretibial region. She tells me laboratory had recently done on 04/20/2013 which are occurring at 1.49 10.8 parathyroid hormone 61 vitamin D 22.9. She states on August 21 her creatinine was 2.0 calcium 10.4 hemoglobin A1c 6.6.  Past Medical History  Diagnosis Date  . Hypertension   . Diabetes mellitus     type 2  . History of chicken pox   . History of measles, mumps, or rubella   . Hyperlipidemia   . Osteoarthritis   . PMB (postmenopausal bleeding) 02/28/2009    Resolved  . Vaginal atrophy   . Vitamin D deficiency   . Mitral valve prolapse   . Migraine     no longer has migraines (80's)  . Renal insufficiency     Dr. Darrick Penna   . Aortic valve sclerosis   . Goiter     Dr. Talmage Nap  . Heart murmur   . History of nuclear stress test 06/2010    dipyridamole; normal pattern of perfusion; ekg negative for ischemia; low risk scan     Past Surgical History  Procedure Laterality Date  . Cesarean section  1972 &1976  . Tubal ligation  1976  . Replacement total knee   2000    left  . Dilation and curettage of uterus  1975  . Cholecystectomy  1991  . Hemorrhoid surgery  1997  . Knee surgery  2000 2011    right knee replacement   . Shoulder surgery  2004 2010   . Dilatation & currettage/hysteroscopy with resectocope N/A 05/15/2013    Procedure: DILATATION & CURETTAGE/HYSTEROSCOPY WITH RESECTOCOPE;  Surgeon: Hal Morales, MD;  Location: [facility];  Service: Gynecology;  Laterality: N/A;  . Transthoracic echocardiogram  07/2010    EF=>55%; LA mildly dilated; trace MR/TR;    Allergies  Allergen Reactions  . Contrast Media [Iodinated Diagnostic Agents]   . Nsaids     Abnormal renal function  . Penicillins Rash    Pt has taken Keflex without difficulty    Current Outpatient Prescriptions  Medication Sig Dispense Refill  . acetaminophen (TYLENOL) 500 MG tablet Take 1 tablet (500 mg total) by mouth every 6 (six) hours as needed for pain. May use OTC med.  Do not fill prescription  30 tablet  0  . amLODipine (NORVASC) 10 MG tablet Take 1 tablet (10 mg total) by mouth at bedtime.  30 tablet  11  . aspirin (ECOTRIN LOW STRENGTH) 81 MG EC tablet Take 81 mg by mouth daily. Swallow whole.      . cephALEXin (KEFLEX)  500 MG capsule Take 500-1,000 mg by mouth as needed. For invasive procedures.  Takes 1 gram 1 hour prior to procedure, 500mg  6 hours after the procedure, 500mg  12 hours after the procedure      . Coenzyme Q10 (CO Q 10) 100 MG CAPS Take 100 mg by mouth 2 (two) times daily.      [name] Kitchen estradiol-norethindrone (COMBIPATCH) 0.05-0.25 MG/DAY Place 1 patch onto the skin 2 (two) times a week. Tuesday and Saturday      . furosemide (LASIX) 20 MG tablet Take 40 mg by mouth daily.      [name] Kitchen glucose blood (ONE TOUCH ULTRA TEST) test strip USE TWICE A DAY AND AS NEEDED  100 each  3  . Lancets Misc. KIT by Does not apply route.      [name] Kitchen lisinopril (PRINIVIL,ZESTRIL) 10 MG tablet TAKE 1 TABLET EVERY DAY  30 tablet  11  . nebivolol (BYSTOLIC) 5 MG tablet Take 1.5 tablets  (7.5 mg total) by mouth 2 (two) times daily. Take 1 1/2 tablets (7.5 mg) two times daily  30 tablet  4  . Omega-3 Fatty Acids (COROMEGA OMEGA 3 SQUEEZE PO) Take 1 packet by mouth once a week.      . progesterone (PROMETRIUM) 100 MG capsule Take 100 mg by mouth daily.      [name] Kitchen pyridOXINE (VITAMIN B-6) 100 MG tablet Take 200 mg by mouth daily.      . rosuvastatin (CRESTOR) 40 MG tablet Take 1 tablet (40 mg total) by mouth at bedtime.  30 tablet  11  . TRADJENTA 5 MG TABS tablet TAKE 1 TABLET BY MOUTH EVERY DAY  30 tablet  5   No current facility-administered medications for this visit.    History   Social History  . Marital Status: Divorced    Spouse Name: N/A    Number of Children: 2  . Years of Education: N/A   Occupational History  . Not on file.   Social History Main Topics  . Smoking status: Never Smoker   . Smokeless tobacco: Not on file  . Alcohol Use: No  . Drug Use: No  . Sexual Activity: Not on file   Other Topics Concern  . Not on file   Social History Narrative  . No narrative on file   Social history is notable that she's divorced has 2 children one grandchild. She does exercise. There is no tobacco or alcohol use..  Family History  Problem Relation Age of Onset  . Hypertension Mother   . Diabetes Mother   . Hyperlipidemia Mother   . Heart attack Mother   . Heart disease Father   . Heart attack Father   . Stroke Brother   . Heart disease Brother   . Diabetes Brother   . Uterine cancer Maternal Grandmother     ROS is negative for fevers, chills or night sweats.  She denies palpitations. She denies wheezing or shortness of breath. There is no chest pain. Denies abdominal complaints presently without bleeding. At times there is some mild edema to her legs. She denies neurologic symptoms and denies claudication.  Other system review is negative.  PE BP 116/60  Pulse 60  Ht 5' 4.5" (1.638 m)  Wt 188 lb 12.8 oz (85.639 kg)  BMI 31.92 kg/m2  General: Alert,  oriented, no distress.  Skin: normal turgor, no rashes HEENT: Normocephalic, atraumatic. Pupils round and reactive; sclera anicteric;no lid lag.  Nose without nasal septal hypertrophy Mouth/Parynx benign; Mallinpatti scale  2 Neck: No JVD, no carotid briuts Lungs: clear to ausculatation and percussion; no wheezing or rales Heart: RRR, s1 s2 normal 2/6 systolic murmur Abdomen: soft, nontender; no hepatosplenomehaly, BS+; abdominal aorta nontender and not dilated by palpation. Pulses 2+ Extremities: no clubbing cyanosis or edema, Homan's sign negative  Neurologic: grossly nonfocal  ECG: Sinus rhythm at 60 beats per minute. First grade AV block with PR interval of 244 ms. QTC interval 356 ms  LABS:  BMET    Component Value Date/Time   NA 134* 05/10/2013 1220   K 4.3 05/10/2013 1220   CL 97 05/10/2013 1220   CO2 25 05/10/2013 1220   GLUCOSE 108* 05/10/2013 1220   BUN 38* 05/10/2013 1220   CREATININE 1.66* 05/10/2013 1220   CALCIUM 11.6* 05/10/2013 1220   GFRNONAA 29* 05/10/2013 1220   GFRAA 33* 05/10/2013 1220     Hepatic Function Panel     Component Value Date/Time   PROT 8.0 05/02/2013 1533   ALBUMIN 3.9 05/02/2013 1533   AST 23 05/02/2013 1533   ALT 15 05/02/2013 1533   ALKPHOS 45 05/02/2013 1533   BILITOT 0.4 05/02/2013 1533   BILIDIR 0.0 05/02/2013 1533     CBC    Component Value Date/Time   WBC 9.5 05/10/2013 1525   RBC 3.87 05/10/2013 1525   HGB 12.9 05/10/2013 1525   HCT 37.6 05/10/2013 1525   PLT 206 05/10/2013 1525   MCV 97.2 05/10/2013 1525   MCH 33.3 05/10/2013 1525   MCHC 34.3 05/10/2013 1525   RDW 12.8 05/10/2013 1525   LYMPHSABS 2.2 05/02/2013 1533   MONOABS 0.9 05/02/2013 1533   EOSABS 0.4 05/02/2013 1533   BASOSABS 0.0 05/02/2013 1533     BNP No results found for this basename: probnp    Lipid Panel     Component Value Date/Time   CHOL 146 01/16/2013 1013   TRIG 74.0 01/16/2013 1013   HDL 45.00 01/16/2013 1013   CHOLHDL 3 01/16/2013 1013   VLDL 14.8 01/16/2013  1013   LDLCALC 86 01/16/2013 1013     RADIOLOGY: No results found.    ASSESSMENT AND PLAN:  This program recently 12-year-old female with long-standing history of hypertension with grade 1 diastolic dysfunction and normal systolic function. She does have aortic valve sclerosis without stenosis or aortic insufficiency and has mild mitral annular calcification trace MR and trace TR. She does have mild renal insufficiency and also has had history of elevated calcium, Dr. Marliss Czar I did review the recent laboratory that she had at [facility]. At that time, BUN was 46 protein 2.0. She has been on ACE inhibition and if renal function worsens this may need to be reduced or discontinued. She presently is well compensated but does note some intermittent edema. She's not having any chest pain. Her last nuclear perfusion study was done 07/22/2010. I will see her in one year for followup evaluation or sooner if problems arise.   Lennette Bihari, MD, [facility]  08/21/2013 4:38 PM

## 2013-08-22 ENCOUNTER — Telehealth: Payer: Self-pay | Admitting: Family

## 2013-08-22 DIAGNOSIS — I10 Essential (primary) hypertension: Secondary | ICD-10-CM | POA: Diagnosis not present

## 2013-08-22 DIAGNOSIS — D649 Anemia, unspecified: Secondary | ICD-10-CM | POA: Diagnosis not present

## 2013-08-22 DIAGNOSIS — E119 Type 2 diabetes mellitus without complications: Secondary | ICD-10-CM | POA: Diagnosis not present

## 2013-08-22 DIAGNOSIS — I129 Hypertensive chronic kidney disease with stage 1 through stage 4 chronic kidney disease, or unspecified chronic kidney disease: Secondary | ICD-10-CM | POA: Diagnosis not present

## 2013-08-22 DIAGNOSIS — Z23 Encounter for immunization: Secondary | ICD-10-CM | POA: Diagnosis not present

## 2013-08-22 DIAGNOSIS — N183 Chronic kidney disease, stage 3 unspecified: Secondary | ICD-10-CM | POA: Diagnosis not present

## 2013-08-22 DIAGNOSIS — N2581 Secondary hyperparathyroidism of renal origin: Secondary | ICD-10-CM | POA: Diagnosis not present

## 2013-08-22 NOTE — Telephone Encounter (Signed)
Needs a recheck within the next 1-2 months.

## 2013-08-22 NOTE — Telephone Encounter (Signed)
done

## 2013-08-22 NOTE — Telephone Encounter (Signed)
Patient was last seen in June. She wants to know when she is supposed to follow up with you. Did not see it on last AVS. Please advise.

## 2013-08-22 NOTE — Telephone Encounter (Signed)
Please call to schedule appointment for 1-2 months out

## 2013-08-30 DIAGNOSIS — N183 Chronic kidney disease, stage 3 unspecified: Secondary | ICD-10-CM | POA: Diagnosis not present

## 2013-09-11 ENCOUNTER — Encounter: Payer: Self-pay | Admitting: Cardiovascular Disease

## 2013-09-13 ENCOUNTER — Telehealth (INDEPENDENT_AMBULATORY_CARE_PROVIDER_SITE_OTHER): Payer: Self-pay

## 2013-09-13 ENCOUNTER — Ambulatory Visit (INDEPENDENT_AMBULATORY_CARE_PROVIDER_SITE_OTHER): Payer: Medicare Other | Admitting: Surgery

## 2013-09-13 NOTE — Telephone Encounter (Signed)
LMOM with new appt date and that I will watch sched to try to move date up.

## 2013-09-21 DIAGNOSIS — M25519 Pain in unspecified shoulder: Secondary | ICD-10-CM | POA: Diagnosis not present

## 2013-10-06 ENCOUNTER — Other Ambulatory Visit: Payer: Self-pay | Admitting: Cardiovascular Disease

## 2013-10-06 NOTE — Telephone Encounter (Signed)
Rx was sent to pharmacy electronically. 

## 2013-10-12 DIAGNOSIS — N951 Menopausal and female climacteric states: Secondary | ICD-10-CM | POA: Diagnosis not present

## 2013-10-12 DIAGNOSIS — N859 Noninflammatory disorder of uterus, unspecified: Secondary | ICD-10-CM | POA: Diagnosis not present

## 2013-10-16 ENCOUNTER — Telehealth: Payer: Self-pay | Admitting: Family

## 2013-10-16 ENCOUNTER — Ambulatory Visit (INDEPENDENT_AMBULATORY_CARE_PROVIDER_SITE_OTHER): Payer: Medicare Other | Admitting: Family

## 2013-10-16 ENCOUNTER — Encounter: Payer: Self-pay | Admitting: Family

## 2013-10-16 VITALS — BP 124/62 | HR 76 | Temp 98.2°F | Wt 193.0 lb

## 2013-10-16 DIAGNOSIS — R05 Cough: Secondary | ICD-10-CM

## 2013-10-16 DIAGNOSIS — R059 Cough, unspecified: Secondary | ICD-10-CM | POA: Diagnosis not present

## 2013-10-16 DIAGNOSIS — J209 Acute bronchitis, unspecified: Secondary | ICD-10-CM | POA: Diagnosis not present

## 2013-10-16 MED ORDER — BENZONATATE 200 MG PO CAPS: 200.0000 mg | ORAL_CAPSULE | Freq: Two times a day (BID) | ORAL | Status: DC | PRN

## 2013-10-16 MED ORDER — PREDNISONE 20 MG PO TABS: 20.0000 mg | ORAL_TABLET | Freq: Every day | ORAL | Status: AC

## 2013-10-16 NOTE — Telephone Encounter (Signed)
Patient Information:  Caller Name: Jatoya  Phone: [phone]  Patient: Bonnie Chavez  Gender: Female  DOB: Feb 24, 1934  Age: 79 Years  PCP: Adline Mango [facility])  Office Follow Up:  Does the office need to follow up with this patient?: No  Instructions For The Office: N/A  RN Note:  Patient states she developed cough, chest, nasal and head congestion, onset 10/09/13. States she developed Laryngitis 10/12/13. Denies sore throat. Patient states cough increased 10/12/13 and is interfering with sleep. Patient states she is expectorating yellow sputum. States streaks of blood noted in sputum and when blowing her nose 10/16/13. Denies wheezing. Patient states no improvement in cough with above home care. Patient also complains of sinus pressure. Care advice given per guidelines. In addition, patient advised warm compresses to face, inhaled steam, humidifer. Call back parameters reviewed. Patient verbalizes understanding.  Symptoms  Reason For Call & Symptoms: Cough, congestion, Laryngitis  Reviewed Health History In EMR: Yes  Reviewed Medications In EMR: Yes  Reviewed Allergies In EMR: Yes  Reviewed Surgeries / Procedures: Yes  Date of Onset of Symptoms: 10/09/2013  Treatments Tried: Warm salt water gargles, Tussin, Tylenol, Vitamin C, Zyrtec, Saline nasal washes  Treatments Tried Worked: No  Guideline(s) Used:  Colds  Cough  Disposition Per Guideline:   See Today in Office  Reason For Disposition Reached:   Severe coughing spells (e.g., whooping sound after coughing, vomiting after coughing)  Advice Given:  Coughing Spasms:  Drink warm fluids. Inhale warm mist (Reason: both relax the airway and loosen up the phlegm).  Prevent Dehydration:  Drink adequate liquids.  This will help soothe an irritated or dry throat and loosen up the phlegm.  Avoid Tobacco Smoke:  Smoking or being exposed to smoke makes coughs much worse.  Expected Course:   The expected course  depends on what is causing the cough.  Viral bronchitis (chest cold) causes a cough that lasts 1 to 3 weeks. Sometimes you may cough up lots of phlegm (sputum, mucus). The mucus can normally be white, gray, yellow, or green.  Call Back If:  Difficulty breathing  You become worse.  For a Stuffy Nose - Use Nasal Washes:  Introduction: Saline (salt water) nasal irrigation (nasal wash) is an effective and simple home remedy for treating stuffy nose and sinus congestion. The nose can be irrigated by pouring, spraying, or squirting salt water into the nose and then letting it run back out.  Call Back If:  Difficulty breathing occurs  You become worse  Patient Will Follow Care Advice:  YES  Appointment Scheduled:  10/16/2013 10:30:00 Appointment Scheduled Provider:  Adline Mango [facility])

## 2013-10-16 NOTE — Progress Notes (Signed)
Subjective:    Patient ID: Bonnie Chavez, female    DOB: 1934/04/04, 79 y.o.   MRN: [id number]  HPI 79 year old [place] American female with a history of type 2 diabetes is in today with complaints of cough, congestion, chest tightness x1 week. She's been using nasal saline, cough syrup and Zyrtec with no relief. Blood sugars are between 83-120 fasting. Postprandial readings between 143-151. Denies any fever   Review of Systems  Constitutional: Negative.   HENT: Negative.   Respiratory: Positive for cough and wheezing.   Cardiovascular: Negative.   Gastrointestinal: Negative.   Endocrine: Negative.   Musculoskeletal: Negative.   Skin: Negative.   Allergic/Immunologic: Negative.   Neurological: Negative.   Psychiatric/Behavioral: Negative.    Past Medical History  Diagnosis Date  . Hypertension   . Diabetes mellitus     type 2  . History of chicken pox   . History of measles, mumps, or rubella   . Hyperlipidemia   . Osteoarthritis   . PMB (postmenopausal bleeding) 02/28/2009    Resolved  . Vaginal atrophy   . Vitamin D deficiency   . Mitral valve prolapse   . Migraine     no longer has migraines (80's)  . Renal insufficiency     Dr. Darrick Penna   . Aortic valve sclerosis   . Goiter     Dr. Talmage Nap  . Heart murmur   . History of nuclear stress test 06/2010    dipyridamole; normal pattern of perfusion; ekg negative for ischemia; low risk scan     History   Social History  . Marital Status: Divorced    Spouse Name: N/A    Number of Children: 2  . Years of Education: N/A   Occupational History  . Not on file.   Social History Main Topics  . Smoking status: Never Smoker   . Smokeless tobacco: Not on file  . Alcohol Use: No  . Drug Use: No  . Sexual Activity: Not on file   Other Topics Concern  . Not on file   Social History Narrative  . No narrative on file    Past Surgical History  Procedure Laterality Date  . Cesarean section  1972 &1976  . Tubal  ligation  1976  . Replacement total knee  2000    left  . Dilation and curettage of uterus  1975  . Cholecystectomy  1991  . Hemorrhoid surgery  1997  . Knee surgery  2000 2011    right knee replacement   . Shoulder surgery  2004 2010   . Dilatation & currettage/hysteroscopy with resectocope N/A 05/15/2013    Procedure: DILATATION & CURETTAGE/HYSTEROSCOPY WITH RESECTOCOPE;  Surgeon: Hal Morales, MD;  Location: [facility];  Service: Gynecology;  Laterality: N/A;  . Transthoracic echocardiogram  07/2010    EF=>55%; LA mildly dilated; trace MR/TR;    Family History  Problem Relation Age of Onset  . Hypertension Mother   . Diabetes Mother   . Hyperlipidemia Mother   . Heart attack Mother   . Heart disease Father   . Heart attack Father   . Stroke Brother   . Heart disease Brother   . Diabetes Brother   . Uterine cancer Maternal Grandmother     Allergies  Allergen Reactions  . Contrast Media [Iodinated Diagnostic Agents]   . Nsaids     Abnormal renal function  . Penicillins Rash    Pt has taken Keflex without difficulty    Current Outpatient  Prescriptions on File Prior to Visit  Medication Sig Dispense Refill  . acetaminophen (TYLENOL) 500 MG tablet Take 1 tablet (500 mg total) by mouth every 6 (six) hours as needed for pain. May use OTC med.  Do not fill prescription  30 tablet  0  . amLODipine (NORVASC) 10 MG tablet Take 1 tablet (10 mg total) by mouth at bedtime.  30 tablet  11  . aspirin (ECOTRIN LOW STRENGTH) 81 MG EC tablet Take 81 mg by mouth daily. Swallow whole.      [name] Kitchen BYSTOLIC 5 MG tablet TAKE 1 AND 1/2 TABLET BY MOUTH 2 TIMES DAILY  90 tablet  5  . cephALEXin (KEFLEX) 500 MG capsule Take 500-1,000 mg by mouth as needed. For invasive procedures.  Takes 1 gram 1 hour prior to procedure, 500mg  6 hours after the procedure, 500mg  12 hours after the procedure      . Coenzyme Q10 (CO Q 10) 100 MG CAPS Take 100 mg by mouth 2 (two) times daily.      [name] Kitchen  estradiol-norethindrone (COMBIPATCH) 0.05-0.25 MG/DAY Place 1 patch onto the skin 2 (two) times a week. Tuesday and Saturday      . furosemide (LASIX) 20 MG tablet Take 40 mg by mouth daily.      [name] Kitchen glucose blood (ONE TOUCH ULTRA TEST) test strip USE TWICE A DAY AND AS NEEDED  100 each  3  . Lancets Misc. KIT by Does not apply route.      [name] Kitchen lisinopril (PRINIVIL,ZESTRIL) 10 MG tablet TAKE 1 TABLET EVERY DAY  30 tablet  11  . Omega-3 Fatty Acids (COROMEGA OMEGA 3 SQUEEZE PO) Take 1 packet by mouth once a week.      . progesterone (PROMETRIUM) 100 MG capsule Take 100 mg by mouth daily.      [name] Kitchen pyridOXINE (VITAMIN B-6) 100 MG tablet Take 200 mg by mouth daily.      . rosuvastatin (CRESTOR) 40 MG tablet Take 1 tablet (40 mg total) by mouth at bedtime.  30 tablet  11  . TRADJENTA 5 MG TABS tablet TAKE 1 TABLET BY MOUTH EVERY DAY  30 tablet  5   No current facility-administered medications on file prior to visit.    BP 124/62  Pulse 76  Temp(Src) 98.2 F (36.8 C) (Oral)  Wt 193 lb (87.544 kg)chart    Objective:   Physical Exam  Constitutional: She is oriented to person, place, and time. She appears well-developed and well-nourished.  HENT:  Right Ear: External ear normal.  Left Ear: External ear normal.  Nose: Nose normal.  Mouth/Throat: Oropharynx is clear and moist.  Neck: Normal range of motion. Neck supple.  Cardiovascular: Normal rate, regular rhythm and normal heart sounds.   Pulmonary/Chest: She has wheezes.  Musculoskeletal: Normal range of motion.  Neurological: She is alert and oriented to person, place, and time.  Skin: Skin is warm and dry.  Psychiatric: She has a normal mood and affect.          Assessment & Plan:  Assessment:  1. Acute Bronchitis  2. Cough  3. Type 2 Diabetes  Plan: Prednisone 20 mg a day x5 days. Tessalon Perles twice a day as needed for cough. Patient to office if symptoms worsen or persist. Recheck as scheduled, and as needed.

## 2013-10-16 NOTE — Progress Notes (Signed)
Pre visit review using our clinic review tool, if applicable. No additional management support is needed unless otherwise documented below in the visit note. 

## 2013-10-16 NOTE — Patient Instructions (Signed)

## 2013-10-24 ENCOUNTER — Other Ambulatory Visit (INDEPENDENT_AMBULATORY_CARE_PROVIDER_SITE_OTHER): Payer: Self-pay | Admitting: Surgery

## 2013-10-24 ENCOUNTER — Ambulatory Visit: Payer: Medicare Other | Admitting: Family

## 2013-10-24 ENCOUNTER — Other Ambulatory Visit (INDEPENDENT_AMBULATORY_CARE_PROVIDER_SITE_OTHER): Payer: Self-pay

## 2013-10-24 ENCOUNTER — Encounter (INDEPENDENT_AMBULATORY_CARE_PROVIDER_SITE_OTHER): Payer: Self-pay | Admitting: Surgery

## 2013-10-24 ENCOUNTER — Ambulatory Visit (INDEPENDENT_AMBULATORY_CARE_PROVIDER_SITE_OTHER): Payer: Medicare Other | Admitting: Surgery

## 2013-10-24 DIAGNOSIS — E042 Nontoxic multinodular goiter: Secondary | ICD-10-CM

## 2013-10-24 DIAGNOSIS — E559 Vitamin D deficiency, unspecified: Secondary | ICD-10-CM

## 2013-10-24 MED ORDER — VITAMIN D (ERGOCALCIFEROL) 1.25 MG (50000 UNIT) PO CAPS: 50000.0000 [IU] | ORAL_CAPSULE | ORAL | Status: DC

## 2013-10-24 NOTE — Progress Notes (Signed)
General Surgery [facility]  Chief Complaint  Patient presents with  . Follow-up    hypercalcemia    HISTORY: Patient is a 79 year old female last evaluated in April of 2014 for hypercalcemia to rule out primary hyperparathyroidism.  Review of laboratory studies over the past several months show a decreasing intact PTH level which measured 62 in February, 52 in August, and 42 in October of 2014. Calcium levels sever main that the upper range of normal measuring 10.4 in August and 10.6 in October. Unfortunately her 25 hydroxy vitamin D level is markedly low at 21.5 as of August 2014.  PERTINENT REVIEW OF SYSTEMS: Denies fatigue. Denies tremor. Denies palpitations. Denies compressive symptoms. Denies nephrolithiasis.  EXAM: HEENT: normocephalic; pupils equal and reactive; sclerae clear; dentition good; mucous membranes moist NECK:  No palpable masses in the thyroid bed; symmetric on extension; no palpable anterior or posterior cervical lymphadenopathy; no supraclavicular masses; no tenderness CHEST: clear to auscultation bilaterally without rales, rhonchi, or wheezes CARDIAC: regular rate and rhythm without significant murmur; peripheral pulses are full EXT:  non-tender without edema; no deformity NEURO: no gross focal deficits; no sign of tremor   IMPRESSION: #1 borderline hypercalcemia, rule out primary hyperparathyroidism #2 vitamin D deficiency #3 bilateral thyroid nodules, clinically stable  PLAN: I am going to start [name] on ergocalciferol, 50,000 international unit units weekly for 15 doses. We will check her laboratory studies in approximately 6 months including a calcium level, and intact PTH level, and a 25 hydroxy vitamin D level.  I believe the patient likely does not have primary hyperparathyroidism. I believe if we can get her vitamin D level into the normal range, her borderline hypercalcemia will resolve. Recent intact PTH level was mid range  and normal at 42.  Patient will return in 6 months for review of laboratory studies and physical examination.  Velora Heckler, MD, [facility] Office: [phone]  Visit Diagnoses: 1. Hypercalcemia   2. Multiple thyroid nodules   3. Unspecified vitamin D deficiency

## 2013-10-24 NOTE — Patient Instructions (Signed)
Vitamin D Deficiency  Vitamin D is an important vitamin that your body needs. Having too little of it in your body is called a deficiency. A very bad deficiency can make your bones soft and can cause a condition called rickets.   Vitamin D is important to your body for different reasons, such as:   · It helps your body absorb 2 minerals called calcium and phosphorus.  · It helps make your bones healthy.  · It may prevent some diseases, such as diabetes and multiple sclerosis.  · It helps your muscles and heart.  You can get vitamin D in several ways. It is a natural part of some foods. The vitamin is also added to some dairy products and cereals. Some people take vitamin D supplements. Also, your body makes vitamin D when you are in the sun. It changes the sun's rays into a form of the vitamin that your body can use.  CAUSES   · Not eating enough foods that contain vitamin D.  · Not getting enough sunlight.  · Having certain digestive system diseases that make it hard to absorb vitamin D. These diseases include Crohn's disease, chronic pancreatitis, and cystic fibrosis.  · Having a surgery in which part of the stomach or small intestine is removed.  · Being obese. Fat cells pull vitamin D out of your blood. That means that obese people may not have enough vitamin D left in their blood and in other body tissues.  · Having chronic kidney or liver disease.  RISK FACTORS  Risk factors are things that make you more likely to develop a vitamin D deficiency. They include:  · Being older.  · Not being able to get outside very much.  · Living in a nursing home.  · Having had broken bones.  · Having weak or thin bones (osteoporosis).  · Having a disease or condition that changes how your body absorbs vitamin D.  · Having dark skin.  · Some medicines such as seizure medicines or steroids.  · Being overweight or obese.  SYMPTOMS  Mild cases of vitamin D deficiency may not have any symptoms. If you have a very bad case, symptoms  may include:  · Bone pain.  · Muscle pain.  · Falling often.  · Broken bones caused by a minor injury, due to osteoporosis.  DIAGNOSIS  A blood test is the best way to tell if you have a vitamin D deficiency.  TREATMENT  Vitamin D deficiency can be treated in different ways. Treatment for vitamin D deficiency depends on what is causing it. Options include:  · Taking vitamin D supplements.  · Taking a calcium supplement. Your caregiver will suggest what dose is best for you.  HOME CARE INSTRUCTIONS  · Take any supplements that your caregiver prescribes. Follow the directions carefully. Take only the suggested amount.  · Have your blood tested 2 months after you start taking supplements.  · Eat foods that contain vitamin D. Healthy choices include:  · Fortified dairy products, cereals, or juices. Fortified means vitamin D has been added to the food. Check the label on the package to be sure.  · Fatty fish like salmon or trout.  · Eggs.  · Oysters.  · Do not use a tanning bed.  · Keep your weight at a healthy level. Lose weight if you need to.  · Keep all follow-up appointments. Your caregiver will need to perform blood tests to make sure your vitamin D deficiency   is going away.  SEEK MEDICAL CARE IF:  · You have any questions about your treatment.  · You continue to have symptoms of vitamin D deficiency.  · You have nausea or vomiting.  · You are constipated.  · You feel confused.  · You have severe abdominal or back pain.  MAKE SURE YOU:  · Understand these instructions.  · Will watch your condition.  · Will get help right away if you are not doing well or get worse.  Document Released: 02/01/2012 Document Revised: 03/06/2013 Document Reviewed: 02/01/2012  Exit[employer]® Patient Information ©2014 [employer].

## 2013-10-25 ENCOUNTER — Ambulatory Visit (INDEPENDENT_AMBULATORY_CARE_PROVIDER_SITE_OTHER): Payer: Medicare Other | Admitting: Family

## 2013-10-25 ENCOUNTER — Encounter: Payer: Self-pay | Admitting: Family

## 2013-10-25 VITALS — BP 118/62 | HR 65 | Wt 190.0 lb

## 2013-10-25 DIAGNOSIS — J209 Acute bronchitis, unspecified: Secondary | ICD-10-CM

## 2013-10-25 DIAGNOSIS — I10 Essential (primary) hypertension: Secondary | ICD-10-CM

## 2013-10-25 DIAGNOSIS — E559 Vitamin D deficiency, unspecified: Secondary | ICD-10-CM | POA: Diagnosis not present

## 2013-10-25 DIAGNOSIS — E119 Type 2 diabetes mellitus without complications: Secondary | ICD-10-CM | POA: Diagnosis not present

## 2013-10-25 LAB — LIPID PANEL
Cholesterol: 131 mg/dL (ref 0–200)
LDL Cholesterol: 73 mg/dL (ref 0–99)
Triglycerides: 72 mg/dL (ref 0.0–149.0)

## 2013-10-25 LAB — BASIC METABOLIC PANEL
BUN: 36 mg/dL — ABNORMAL HIGH (ref 6–23)
Chloride: 103 mEq/L (ref 96–112)
Creatinine, Ser: 1.7 mg/dL — ABNORMAL HIGH (ref 0.4–1.2)
GFR: 37.68 mL/min — ABNORMAL LOW (ref >60.00)

## 2013-10-25 LAB — HEMOGLOBIN A1C: Hgb A1c MFr Bld: 7 % — ABNORMAL HIGH (ref 4.6–6.5)

## 2013-10-25 LAB — HEPATIC FUNCTION PANEL
ALT: 22 U/L (ref 0–35)
AST: 26 U/L (ref 0–37)
Albumin: 3.8 g/dL (ref 3.5–5.2)
Alkaline Phosphatase: 46 U/L (ref 39–117)
Bilirubin, Direct: 0 mg/dL (ref 0.0–0.3)
Total Protein: 7.9 g/dL (ref 6.0–8.3)

## 2013-10-25 MED ORDER — GUAIFENESIN-CODEINE 100-10 MG/5ML PO SYRP: 5.0000 mL | ORAL_SOLUTION | Freq: Three times a day (TID) | ORAL | Status: DC | PRN

## 2013-10-25 NOTE — Patient Instructions (Signed)
Metered Dose Inhaler (No Spacer Used) Inhaled medicines are the basis of asthma treatment and other breathing problems. Inhaled medicine can only be effective if used properly. Good technique assures that the medicine reaches the lungs. Metered dose inhalers (MDIs) are used to deliver a variety of inhaled medicines. These include quick relief or rescue medicines (such as bronchodilators) and controller medicines (such as corticosteroids). The medicine is delivered by pushing down on a metal canister to release a set amount of spray. If you are using different kinds of inhalers, use your quick relief medicine to open the airways 10 15 minutes before using a steroid if instructed to do so by your health care provider. If you are unsure which inhalers to use and the order of using them, ask your health care provider, nurse, or respiratory therapist. HOW TO USE THE INHALER 1. Remove cap from inhaler. 2. If you are using the inhaler for the first time, you will need to prime it. Shake the inhaler for 5 seconds and release four puffs into the air, away from your face. Ask your health care provider or pharmacist if you have questions about priming your inhaler. 3. Shake inhaler for 5 seconds before each breath in (inhalation). 4. Position the inhaler so that the top of the canister faces up. 5. Put your index finger on the top of the medicine canister. Your thumb supports the bottom of the inhaler. 6. Open your mouth. 7. Either place the inhaler between your teeth and place your lips tightly around the mouthpiece, or hold the inhaler 1 2 inches away from your open mouth. If you are unsure of which technique to use, ask your health care provider. 8. Breathe out (exhale) normally and as completely as possible. 9. Press the canister down with the index finger to release the medicine. 10. At the same time as the canister is pressed, inhale deeply and slowly until the lungs are completely filled. This should take  4 6 seconds. Keep your tongue down. 11. Hold the medicine in your lungs for up to 5 10 seconds (10 seconds is best). This helps the medicine get into the small airways of your lungs. 12. Breathe out slowly, through pursed lips. Whistling is an example of pursed lips. 13. Wait at least 1 minute between puffs. Continue with the above steps until you have taken the number of puffs your health care provider has ordered. Do not use the inhaler more than your health care provider directs you to. 14. Replace cap on inhaler. 15. Follow the directions from your health care provider or the inhaler insert for cleaning the inhaler. If you are using a steroid inhaler, rinse your mouth with water after your last puff, gargle, and spit out the water. Do not swallow the water. AVOID:  Inhaling before or after starting the spray of medicine. It takes practice to coordinate your breathing with triggering the spray.  Inhaling through the nose (rather than the mouth) when triggering the spray. HOW TO DETERMINE IF YOUR INHALER IS FULL OR NEARLY EMPTY You cannot know when an inhaler is empty by shaking it. A few inhalers are now being made with dose counters. Ask your health care provider for a prescription that has a dose counter if you feel you need that extra help. If your inhaler does not have a counter, ask your health care provider to help you determine the date you need to refill your inhaler. Write the refill date on a calendar or your inhaler canister.   Refill your inhaler 7 10 days before it runs out. Be sure to keep an adequate supply of medicine. This includes making sure it is not expired, and you have a spare inhaler.  SEEK MEDICAL CARE IF:   Symptoms are only partially relieved with your inhaler.  You are having trouble using your inhaler.  You experience some increase in phlegm. SEEK IMMEDIATE MEDICAL CARE IF:   You feel little or no relief with your inhalers. You are still wheezing and are feeling  shortness of breath or tightness in your chest or both.  You have dizziness, headaches, or fast heart rate.  You have chills, fever, or night sweats.  There is a noticeable increase in phlegm production, or there is blood in the phlegm. Document Released: 09/06/2007 Document Revised: 07/12/2013 Document Reviewed: 04/27/2013 [employer] Patient Information 2014 [employer].  

## 2013-10-25 NOTE — Progress Notes (Signed)
Subjective:    Patient ID: Bonnie Chavez, female    DOB: September 23, 1934, 79 y.o.   MRN: [id number]  HPI  79 year old AA female presenting with history of Diabetes Mellitus Type II, HTN, Hyperlipidemia, osteoarthritis, and Unspecified Vitamin D deficiency.  Patient compliant with medications. Blood sugar was 147.  Patient has a concern that medication that she received on November 24 is not working.  She is requesting another medication to help her with the cough.   Review of Systems  Constitutional: Negative.   HENT: Negative.   Eyes: Negative.   Respiratory:       Patient complains of a nonproductive, dry and hacking cough.  Cardiovascular: Negative.   Gastrointestinal: Negative.   Endocrine: Negative.   Genitourinary: Negative.   Musculoskeletal: Negative.   Skin: Negative.   Allergic/Immunologic: Negative.   Neurological: Negative.   Hematological: Negative.   Psychiatric/Behavioral: Negative.    Past Medical History  Diagnosis Date  . Hypertension   . Diabetes mellitus     type 2  . History of chicken pox   . History of measles, mumps, or rubella   . Hyperlipidemia   . Osteoarthritis   . PMB (postmenopausal bleeding) 02/28/2009    Resolved  . Vaginal atrophy   . Vitamin D deficiency   . Mitral valve prolapse   . Migraine     no longer has migraines (80's)  . Renal insufficiency     Dr. Darrick Penna   . Aortic valve sclerosis   . Goiter     Dr. Talmage Nap  . Heart murmur   . History of nuclear stress test 06/2010    dipyridamole; normal pattern of perfusion; ekg negative for ischemia; low risk scan     History   Social History  . Marital Status: Divorced    Spouse Name: N/A    Number of Children: 2  . Years of Education: N/A   Occupational History  . Not on file.   Social History Main Topics  . Smoking status: Never Smoker   . Smokeless tobacco: Not on file  . Alcohol Use: No  . Drug Use: No  . Sexual Activity: Not on file   Other Topics Concern  . Not on  file   Social History Narrative  . No narrative on file    Past Surgical History  Procedure Laterality Date  . Cesarean section  1972 &1976  . Tubal ligation  1976  . Replacement total knee  2000    left  . Dilation and curettage of uterus  1975  . Cholecystectomy  1991  . Hemorrhoid surgery  1997  . Knee surgery  2000 2011    right knee replacement   . Shoulder surgery  2004 2010   . Dilatation & currettage/hysteroscopy with resectocope N/A 05/15/2013    Procedure: DILATATION & CURETTAGE/HYSTEROSCOPY WITH RESECTOCOPE;  Surgeon: Hal Morales, MD;  Location: [facility];  Service: Gynecology;  Laterality: N/A;  . Transthoracic echocardiogram  07/2010    EF=>55%; LA mildly dilated; trace MR/TR;    Family History  Problem Relation Age of Onset  . Hypertension Mother   . Diabetes Mother   . Hyperlipidemia Mother   . Heart attack Mother   . Heart disease Father   . Heart attack Father   . Stroke Brother   . Heart disease Brother   . Diabetes Brother   . Uterine cancer Maternal Grandmother     Allergies  Allergen Reactions  . Contrast Media [Iodinated  Diagnostic Agents]   . Nsaids     Abnormal renal function  . Penicillins Rash    Pt has taken Keflex without difficulty    Current Outpatient Prescriptions on File Prior to Visit  Medication Sig Dispense Refill  . acetaminophen (TYLENOL) 500 MG tablet Take 1 tablet (500 mg total) by mouth every 6 (six) hours as needed for pain. May use OTC med.  Do not fill prescription  30 tablet  0  . amLODipine (NORVASC) 10 MG tablet Take 1 tablet (10 mg total) by mouth at bedtime.  30 tablet  11  . aspirin (ECOTRIN LOW STRENGTH) 81 MG EC tablet Take 81 mg by mouth daily. Swallow whole.      . benzonatate (TESSALON) 200 MG capsule Take 1 capsule (200 mg total) by mouth 2 (two) times daily as needed for cough.  20 capsule  0  . BYSTOLIC 5 MG tablet TAKE 1 AND 1/2 TABLET BY MOUTH 2 TIMES DAILY  90 tablet  5  . cephALEXin (KEFLEX) 500 MG  capsule Take 500-1,000 mg by mouth as needed. For invasive procedures.  Takes 1 gram 1 hour prior to procedure, 500mg  6 hours after the procedure, 500mg  12 hours after the procedure      . Coenzyme Q10 (CO Q 10) 100 MG CAPS Take 100 mg by mouth 2 (two) times daily.      [name] Kitchen estradiol-norethindrone (COMBIPATCH) 0.05-0.25 MG/DAY Place 1 patch onto the skin 2 (two) times a week. Tuesday and Saturday      . furosemide (LASIX) 20 MG tablet Take 40 mg by mouth daily.      [name] Kitchen glucose blood (ONE TOUCH ULTRA TEST) test strip USE TWICE A DAY AND AS NEEDED  100 each  3  . Lancets Misc. KIT by Does not apply route.      [name] Kitchen lisinopril (PRINIVIL,ZESTRIL) 10 MG tablet TAKE 1 TABLET EVERY DAY  30 tablet  11  . Omega-3 Fatty Acids (COROMEGA OMEGA 3 SQUEEZE PO) Take 1 packet by mouth once a week.      . predniSONE (DELTASONE) 20 MG tablet Take 1 tablet (20 mg total) by mouth daily.  5 tablet  0  . progesterone (PROMETRIUM) 100 MG capsule Take 100 mg by mouth daily.      [name] Kitchen pyridOXINE (VITAMIN B-6) 100 MG tablet Take 200 mg by mouth daily.      . rosuvastatin (CRESTOR) 40 MG tablet Take 1 tablet (40 mg total) by mouth at bedtime.  30 tablet  11  . TRADJENTA 5 MG TABS tablet TAKE 1 TABLET BY MOUTH EVERY DAY  30 tablet  5  . Vitamin D, Ergocalciferol, (DRISDOL) 50000 UNITS CAPS capsule Take 1 capsule (50,000 Units total) by mouth every 7 (seven) days.  15 capsule  1   No current facility-administered medications on file prior to visit.    BP 118/62  Pulse 65  Wt 190 lb (86.183 kg)chart    Objective:   Physical Exam  Constitutional: She is oriented to person, place, and time. She appears well-developed and well-nourished.  Cardiovascular: Normal rate and regular rhythm.   Pulmonary/Chest: Breath sounds normal.  Abdominal: Bowel sounds are normal.  Musculoskeletal: Normal range of motion.  Neurological: She is alert and oriented to person, place, and time.  Skin: Skin is warm and dry.          Assessment &  Plan:  Assessment: 1. Diabetes Mellitus Type II 2 HTN 3. Hyperlipidemia 4. Osteoarthritis 5. Unspecified vitamin D  deficiency  Plan: Continue current medications.  Labs sent to include CMP, LIPID, CBC, TSH.  Flu shot administered.  Symbicort Inhaler - 2 puffs BID and Cher-tussin were given for cough.  Recheck in 3 months and sooner if needed.

## 2013-12-04 ENCOUNTER — Other Ambulatory Visit: Payer: Self-pay | Admitting: Family

## 2013-12-26 DIAGNOSIS — N183 Chronic kidney disease, stage 3 unspecified: Secondary | ICD-10-CM | POA: Diagnosis not present

## 2013-12-26 DIAGNOSIS — D649 Anemia, unspecified: Secondary | ICD-10-CM | POA: Diagnosis not present

## 2013-12-26 DIAGNOSIS — E119 Type 2 diabetes mellitus without complications: Secondary | ICD-10-CM | POA: Diagnosis not present

## 2013-12-26 DIAGNOSIS — I10 Essential (primary) hypertension: Secondary | ICD-10-CM | POA: Diagnosis not present

## 2014-01-19 DIAGNOSIS — E049 Nontoxic goiter, unspecified: Secondary | ICD-10-CM | POA: Diagnosis not present

## 2014-01-19 DIAGNOSIS — IMO0001 Reserved for inherently not codable concepts without codable children: Secondary | ICD-10-CM | POA: Diagnosis not present

## 2014-01-23 ENCOUNTER — Encounter: Payer: Self-pay | Admitting: Family

## 2014-01-23 ENCOUNTER — Ambulatory Visit (INDEPENDENT_AMBULATORY_CARE_PROVIDER_SITE_OTHER): Payer: Medicare Other | Admitting: Family

## 2014-01-23 VITALS — BP 132/84 | HR 65 | Ht 63.5 in | Wt 194.0 lb

## 2014-01-23 DIAGNOSIS — M1990 Unspecified osteoarthritis, unspecified site: Secondary | ICD-10-CM | POA: Diagnosis not present

## 2014-01-23 DIAGNOSIS — I10 Essential (primary) hypertension: Secondary | ICD-10-CM | POA: Diagnosis not present

## 2014-01-23 DIAGNOSIS — E119 Type 2 diabetes mellitus without complications: Secondary | ICD-10-CM

## 2014-01-23 DIAGNOSIS — E785 Hyperlipidemia, unspecified: Secondary | ICD-10-CM | POA: Diagnosis not present

## 2014-01-23 LAB — LIPID PANEL
Cholesterol: 132 mg/dL (ref 0–200)
HDL: 44 mg/dL (ref >39.00)
LDL CALC: 74 mg/dL (ref 0–99)
TRIGLYCERIDES: 69 mg/dL (ref 0.0–149.0)
Total CHOL/HDL Ratio: 3
VLDL: 13.8 mg/dL (ref 0.0–40.0)

## 2014-01-23 LAB — HEPATIC FUNCTION PANEL
ALBUMIN: 3.9 g/dL (ref 3.5–5.2)
ALK PHOS: 43 U/L (ref 39–117)
ALT: 16 U/L (ref 0–35)
AST: 23 U/L (ref 0–37)
BILIRUBIN DIRECT: 0 mg/dL (ref 0.0–0.3)
Total Bilirubin: 0.5 mg/dL (ref 0.3–1.2)
Total Protein: 7.8 g/dL (ref 6.0–8.3)

## 2014-01-23 LAB — HEMOGLOBIN A1C: Hgb A1c MFr Bld: 6.8 % — ABNORMAL HIGH (ref 4.6–6.5)

## 2014-01-23 MED ORDER — GLUCOSE BLOOD VI STRP: ORAL_STRIP | Status: DC

## 2014-01-23 MED ORDER — LANCETS MISC. KIT: 1.0000 | PACK | Freq: Two times a day (BID) | Status: DC

## 2014-01-23 NOTE — Patient Instructions (Signed)

## 2014-01-23 NOTE — Progress Notes (Signed)
Subjective:    Patient ID: Bonnie Chavez, female    DOB: [birth year], 80 y.o.   MRN: [id number]  HPI 80 year old African American female, nonsmoker, is in for recheck of type 2 diabetes, hypertension, hyperlipidemia, hypercalcemia. She sees endocrinology for management of thyroid nodules. Denies any concerns today.   Review of Systems  Constitutional: Negative.   HENT: Negative.   Respiratory: Negative.   Cardiovascular: Negative.   Gastrointestinal: Negative.   Endocrine: Negative.   Genitourinary: Negative.   Musculoskeletal: Negative.   Skin: Negative.   Allergic/Immunologic: Negative.   Neurological: Negative.   Hematological: Negative.   Psychiatric/Behavioral: Negative.    Past Medical History  Diagnosis Date  . Hypertension   . Diabetes mellitus     type 2  . History of chicken pox   . History of measles, mumps, or rubella   . Hyperlipidemia   . Osteoarthritis   . PMB (postmenopausal bleeding) 02/28/2009    Resolved  . Vaginal atrophy   . Vitamin D deficiency   . Mitral valve prolapse   . Migraine     no longer has migraines (80's)  . Renal insufficiency     [name]   . Aortic valve sclerosis   . Goiter     Dr. Chalmers Cater  . Heart murmur   . History of nuclear stress test 06/2010    dipyridamole; normal pattern of perfusion; ekg negative for ischemia; low risk scan     History   Social History  . Marital Status: Divorced    Spouse Name: N/A    Number of Children: 2  . Years of Education: N/A   Occupational History  . Not on file.   Social History Main Topics  . Smoking status: Never Smoker   . Smokeless tobacco: Not on file  . Alcohol Use: No  . Drug Use: No  . Sexual Activity: Not on file   Other Topics Concern  . Not on file   Social History Narrative  . No narrative on file    Past Surgical History  Procedure Laterality Date  . Cesarean section  1972 &1976  . Tubal ligation  1976  . Replacement total knee  2000    left  .  Dilation and curettage of uterus  1975  . Cholecystectomy  1991  . Hemorrhoid surgery  1997  . Knee surgery  2000 2011    right knee replacement   . Shoulder surgery  2004 2010   . Dilatation & currettage/hysteroscopy with resectocope N/A 05/15/2013    Procedure: [place];  Surgeon: Eldred Manges, MD;  Location: [facility];  Service: Gynecology;  Laterality: N/A;  . Transthoracic echocardiogram  07/2010    EF=>55%; LA mildly dilated; trace MR/TR;    Family History  Problem Relation Age of Onset  . Hypertension Mother   . Diabetes Mother   . Hyperlipidemia Mother   . Heart attack Mother   . Heart disease Father   . Heart attack Father   . Stroke Brother   . Heart disease Brother   . Diabetes Brother   . Uterine cancer Maternal Grandmother     Allergies  Allergen Reactions  . Contrast Media [Iodinated Diagnostic Agents]   . Nsaids     Abnormal renal function  . Penicillins Rash    Pt has taken Keflex without difficulty    Current Outpatient Prescriptions on File Prior to Visit  Medication Sig Dispense Refill  . acetaminophen (TYLENOL) 500 MG  tablet Take 1 tablet (500 mg total) by mouth every 6 (six) hours as needed for pain. May use OTC med.  Do not fill prescription  30 tablet  0  . amLODipine (NORVASC) 10 MG tablet Take 1 tablet (10 mg total) by mouth at bedtime.  30 tablet  11  . aspirin (ECOTRIN LOW STRENGTH) 81 MG EC tablet Take 81 mg by mouth daily. Swallow whole.      [name] Kitchen BYSTOLIC 5 MG tablet TAKE 1 AND 1/2 TABLET BY MOUTH 2 TIMES DAILY  90 tablet  5  . Coenzyme Q10 (CO Q 10) 100 MG CAPS Take 100 mg by mouth 2 (two) times daily.      [name] Kitchen estradiol-norethindrone (COMBIPATCH) 0.05-0.25 MG/DAY Place 1 patch onto the skin 2 (two) times a week. Tuesday and Saturday      . furosemide (LASIX) 20 MG tablet Take 40 mg by mouth daily.      [name] Kitchen lisinopril (PRINIVIL,ZESTRIL) 10 MG tablet TAKE 1 TABLET EVERY DAY  30 tablet  11  . Omega-3 Fatty  Acids (COROMEGA OMEGA 3 SQUEEZE PO) Take 1 packet by mouth once a week.      . progesterone (PROMETRIUM) 100 MG capsule Take 100 mg by mouth daily.      [name] Kitchen pyridOXINE (VITAMIN B-6) 100 MG tablet Take 200 mg by mouth daily.      . rosuvastatin (CRESTOR) 40 MG tablet Take 1 tablet (40 mg total) by mouth at bedtime.  30 tablet  11  . TRADJENTA 5 MG TABS tablet TAKE 1 TABLET BY MOUTH EVERY DAY  30 tablet  5  . Vitamin D, Ergocalciferol, (DRISDOL) 50000 UNITS CAPS capsule Take 1 capsule (50,000 Units total) by mouth every 7 (seven) days.  15 capsule  1   No current facility-administered medications on file prior to visit.    BP 132/84  Pulse 65  Ht 5' 3.5" (1.613 m)  Wt 194 lb (87.998 kg)  BMI 33.82 kg/m2chart     Objective:   Physical Exam  Constitutional: She is oriented to person, place, and time. She appears well-developed and well-nourished.  HENT:  Right Ear: External ear normal.  Left Ear: External ear normal.  Nose: Nose normal.  Mouth/Throat: Oropharynx is clear and moist.  Eyes: Conjunctivae are normal. Pupils are equal, round, and reactive to light.  Neck: Normal range of motion. Neck supple.  Cardiovascular: Normal rate, regular rhythm and normal heart sounds.   Pulmonary/Chest: Effort normal and breath sounds normal.  Abdominal: Soft. Bowel sounds are normal.  Musculoskeletal: Normal range of motion.  Neurological: She is alert and oriented to person, place, and time.  Skin: Skin is warm and dry.  Psychiatric: She has a normal mood and affect.          Assessment & Plan:  [name] was seen today for follow-up.  Diagnoses and associated orders for this visit:  Unspecified essential hypertension - Cancel: CBC with Differential - Hepatic Function Panel  Type II or unspecified type diabetes mellitus without mention of complication, not stated as uncontrolled - Cancel: Basic Metabolic Panel - Cancel: CBC with Differential - Hemoglobin A1c  Osteoarthritis  Other  and unspecified hyperlipidemia - Cancel: CBC with Differential - Hepatic Function Panel - Lipid Panel  Other Orders - Lancets Misc. KIT; 1 Device by Does not apply route 2 (two) times daily. - glucose blood (ONE TOUCH ULTRA TEST) test strip; USE TWICE A DAY AND AS NEEDED     Call the office with any  questions or concerns. Recheck in 4 months for complete physical exam and sooner as needed.

## 2014-01-23 NOTE — Progress Notes (Signed)
Pre visit review using our clinic review tool, if applicable. No additional management support is needed unless otherwise documented below in the visit note. 

## 2014-01-24 ENCOUNTER — Telehealth: Payer: Self-pay | Admitting: Family

## 2014-01-24 ENCOUNTER — Other Ambulatory Visit: Payer: Self-pay | Admitting: Endocrinology

## 2014-01-24 DIAGNOSIS — IMO0001 Reserved for inherently not codable concepts without codable children: Secondary | ICD-10-CM | POA: Diagnosis not present

## 2014-01-24 DIAGNOSIS — E049 Nontoxic goiter, unspecified: Secondary | ICD-10-CM | POA: Diagnosis not present

## 2014-01-24 DIAGNOSIS — I10 Essential (primary) hypertension: Secondary | ICD-10-CM | POA: Diagnosis not present

## 2014-01-24 NOTE — Telephone Encounter (Signed)
Relevant patient education mailed to patient.  

## 2014-01-25 ENCOUNTER — Telehealth: Payer: Self-pay

## 2014-01-25 NOTE — Telephone Encounter (Signed)
Relevant patient education mailed to patient.  

## 2014-01-29 ENCOUNTER — Ambulatory Visit
Admission: RE | Admit: 2014-01-29 | Discharge: 2014-01-29 | Disposition: A | Discharge status: Home / Self Care | Payer: Medicare Other | Source: Ambulatory Visit | Attending: Endocrinology | Admitting: Endocrinology

## 2014-01-29 DIAGNOSIS — E049 Nontoxic goiter, unspecified: Secondary | ICD-10-CM

## 2014-01-29 DIAGNOSIS — E041 Nontoxic single thyroid nodule: Secondary | ICD-10-CM | POA: Diagnosis not present

## 2014-02-01 ENCOUNTER — Telehealth: Payer: Self-pay | Admitting: Family

## 2014-02-01 NOTE — Telephone Encounter (Signed)
Pt states she is needing a copy of her lab results from 01/23/14

## 2014-02-02 NOTE — Telephone Encounter (Signed)
Copy was mailed on 01/24/2014. Another copy mailed and message left to advise pt

## 2014-02-07 DIAGNOSIS — E119 Type 2 diabetes mellitus without complications: Secondary | ICD-10-CM | POA: Diagnosis not present

## 2014-03-27 ENCOUNTER — Other Ambulatory Visit: Payer: Self-pay

## 2014-03-27 DIAGNOSIS — Z1231 Encounter for screening mammogram for malignant neoplasm of breast: Secondary | ICD-10-CM

## 2014-03-30 ENCOUNTER — Other Ambulatory Visit: Payer: Self-pay

## 2014-03-30 MED ORDER — NEBIVOLOL HCL 5 MG PO TABS: ORAL_TABLET | ORAL | Status: DC

## 2014-03-30 NOTE — Telephone Encounter (Signed)
Rx was sent to pharmacy electronically. 

## 2014-04-10 ENCOUNTER — Other Ambulatory Visit (INDEPENDENT_AMBULATORY_CARE_PROVIDER_SITE_OTHER): Payer: Self-pay

## 2014-04-10 DIAGNOSIS — E213 Hyperparathyroidism, unspecified: Secondary | ICD-10-CM

## 2014-04-10 DIAGNOSIS — E559 Vitamin D deficiency, unspecified: Secondary | ICD-10-CM

## 2014-04-12 DIAGNOSIS — N859 Noninflammatory disorder of uterus, unspecified: Secondary | ICD-10-CM | POA: Diagnosis not present

## 2014-04-12 DIAGNOSIS — N951 Menopausal and female climacteric states: Secondary | ICD-10-CM | POA: Diagnosis not present

## 2014-04-25 ENCOUNTER — Encounter: Payer: Medicare Other | Admitting: Family

## 2014-05-02 ENCOUNTER — Ambulatory Visit
Admission: RE | Admit: 2014-05-02 | Discharge: 2014-05-02 | Disposition: A | Discharge status: Home / Self Care | Payer: Medicare Other | Source: Ambulatory Visit

## 2014-05-02 ENCOUNTER — Encounter (INDEPENDENT_AMBULATORY_CARE_PROVIDER_SITE_OTHER): Payer: Self-pay

## 2014-05-02 DIAGNOSIS — Z1231 Encounter for screening mammogram for malignant neoplasm of breast: Secondary | ICD-10-CM

## 2014-05-21 DIAGNOSIS — N039 Chronic nephritic syndrome with unspecified morphologic changes: Secondary | ICD-10-CM | POA: Diagnosis not present

## 2014-05-21 DIAGNOSIS — D631 Anemia in chronic kidney disease: Secondary | ICD-10-CM | POA: Diagnosis not present

## 2014-05-21 DIAGNOSIS — I129 Hypertensive chronic kidney disease with stage 1 through stage 4 chronic kidney disease, or unspecified chronic kidney disease: Secondary | ICD-10-CM | POA: Diagnosis not present

## 2014-05-21 DIAGNOSIS — N2581 Secondary hyperparathyroidism of renal origin: Secondary | ICD-10-CM | POA: Diagnosis not present

## 2014-05-21 DIAGNOSIS — N183 Chronic kidney disease, stage 3 unspecified: Secondary | ICD-10-CM | POA: Diagnosis not present

## 2014-05-24 ENCOUNTER — Encounter: Payer: Self-pay | Admitting: Family

## 2014-05-24 ENCOUNTER — Ambulatory Visit (INDEPENDENT_AMBULATORY_CARE_PROVIDER_SITE_OTHER): Payer: Medicare Other | Admitting: Family

## 2014-05-24 VITALS — BP 108/54 | HR 64 | Ht 63.5 in | Wt 191.0 lb

## 2014-05-24 DIAGNOSIS — E7800 Pure hypercholesterolemia, unspecified: Secondary | ICD-10-CM

## 2014-05-24 DIAGNOSIS — I10 Essential (primary) hypertension: Secondary | ICD-10-CM

## 2014-05-24 DIAGNOSIS — N181 Chronic kidney disease, stage 1: Secondary | ICD-10-CM | POA: Diagnosis not present

## 2014-05-24 DIAGNOSIS — N189 Chronic kidney disease, unspecified: Secondary | ICD-10-CM | POA: Insufficient documentation

## 2014-05-24 DIAGNOSIS — E119 Type 2 diabetes mellitus without complications: Secondary | ICD-10-CM

## 2014-05-24 LAB — BASIC METABOLIC PANEL
BUN: 38 mg/dL — AB (ref 6–23)
CALCIUM: 10.7 mg/dL — AB (ref 8.4–10.5)
CHLORIDE: 103 meq/L (ref 96–112)
CO2: 27 mEq/L (ref 19–32)
Creatinine, Ser: 1.7 mg/dL — ABNORMAL HIGH (ref 0.4–1.2)
GFR: 36.62 mL/min — ABNORMAL LOW (ref >60.00)
Glucose, Bld: 148 mg/dL — ABNORMAL HIGH (ref 70–99)
Potassium: 4.2 mEq/L (ref 3.5–5.1)
Sodium: 135 mEq/L (ref 135–145)

## 2014-05-24 LAB — MICROALBUMIN / CREATININE URINE RATIO
CREATININE, U: 65.9 mg/dL
MICROALB UR: 0.9 mg/dL (ref 0.0–1.9)
Microalb Creat Ratio: 1.4 mg/g (ref 0.0–30.0)

## 2014-05-24 LAB — HEPATIC FUNCTION PANEL
ALT: 15 U/L (ref 0–35)
AST: 25 U/L (ref 0–37)
Albumin: 4 g/dL (ref 3.5–5.2)
Alkaline Phosphatase: 44 U/L (ref 39–117)
BILIRUBIN DIRECT: 0.1 mg/dL (ref 0.0–0.3)
BILIRUBIN TOTAL: 0.6 mg/dL (ref 0.2–1.2)
TOTAL PROTEIN: 8 g/dL (ref 6.0–8.3)

## 2014-05-24 LAB — HEMOGLOBIN A1C: HEMOGLOBIN A1C: 7 % — AB (ref 4.6–6.5)

## 2014-05-24 NOTE — Progress Notes (Signed)
Pre visit review using our clinic review tool, if applicable. No additional management support is needed unless otherwise documented below in the visit note. 

## 2014-05-24 NOTE — Progress Notes (Signed)
Subjective:    Patient ID: Bonnie Chavez, female    DOB: 10-23-1934, 80 y.o.   MRN: [id number]  HPI 80 year old African American female, nonsmoker with a history of type 2 diabetes, hypertension, hyperlipidemia, hypercalcemia, and chronic kidney disease is in today for recheck. Denies any concerns. Is under the care of nephrology for management of chronic kidney disease and doing well. Diabetic eye exam is up to date.   Review of Systems  Constitutional: Negative.   HENT: Negative.   Respiratory: Negative.   Cardiovascular: Negative.   Gastrointestinal: Negative.   Endocrine: Negative.   Genitourinary: Negative.   Musculoskeletal: Negative.   Skin: Negative.   Neurological: Negative.   Hematological: Negative.   Psychiatric/Behavioral: Negative.    Past Medical History  Diagnosis Date  . Hypertension   . Diabetes mellitus     type 2  . History of chicken pox   . History of measles, mumps, or rubella   . Hyperlipidemia   . Osteoarthritis   . PMB (postmenopausal bleeding) 02/28/2009    Resolved  . Vaginal atrophy   . Vitamin D deficiency   . Mitral valve prolapse   . Migraine     no longer has migraines (80's)  . Renal insufficiency     [name]   . Aortic valve sclerosis   . Goiter     Dr. Chalmers Cater  . Heart murmur   . History of nuclear stress test 06/2010    dipyridamole; normal pattern of perfusion; ekg negative for ischemia; low risk scan     History   Social History  . Marital Status: Divorced    Spouse Name: N/A    Number of Children: 2  . Years of Education: N/A   Occupational History  . Not on file.   Social History Main Topics  . Smoking status: Never Smoker   . Smokeless tobacco: Not on file  . Alcohol Use: No  . Drug Use: No  . Sexual Activity: Not on file   Other Topics Concern  . Not on file   Social History Narrative  . No narrative on file    Past Surgical History  Procedure Laterality Date  . Cesarean section  1972 &1976  .  Tubal ligation  1976  . Replacement total knee  2000    left  . Dilation and curettage of uterus  1975  . Cholecystectomy  1991  . Hemorrhoid surgery  1997  . Knee surgery  2000 2011    right knee replacement   . Shoulder surgery  2004 2010   . Dilatation & currettage/hysteroscopy with resectocope N/A 05/15/2013    Procedure: Burna;  Surgeon: Eldred Manges, MD;  Location: [facility];  Service: Gynecology;  Laterality: N/A;  . Transthoracic echocardiogram  07/2010    EF=>55%; LA mildly dilated; trace MR/TR;    Family History  Problem Relation Age of Onset  . Hypertension Mother   . Diabetes Mother   . Hyperlipidemia Mother   . Heart attack Mother   . Heart disease Father   . Heart attack Father   . Stroke Brother   . Heart disease Brother   . Diabetes Brother   . Uterine cancer Maternal Grandmother     Allergies  Allergen Reactions  . Contrast Media [Iodinated Diagnostic Agents]   . Nsaids     Abnormal renal function  . Penicillins Rash    Pt has taken Keflex without difficulty    Current Outpatient  Prescriptions on File Prior to Visit  Medication Sig Dispense Refill  . acetaminophen (TYLENOL) 500 MG tablet Take 1 tablet (500 mg total) by mouth every 6 (six) hours as needed for pain. May use OTC med.  Do not fill prescription  30 tablet  0  . amLODipine (NORVASC) 10 MG tablet Take 1 tablet (10 mg total) by mouth at bedtime.  30 tablet  11  . aspirin (ECOTRIN LOW STRENGTH) 81 MG EC tablet Take 81 mg by mouth daily. Swallow whole.      . Coenzyme Q10 (CO Q 10) 100 MG CAPS Take 100 mg by mouth 2 (two) times daily.      [name] Kitchen estradiol-norethindrone (COMBIPATCH) 0.05-0.25 MG/DAY Place 1 patch onto the skin 2 (two) times a week. Tuesday and Saturday      . furosemide (LASIX) 20 MG tablet Take 40 mg by mouth daily.      [name] Kitchen glucose blood (ONE TOUCH ULTRA TEST) test strip USE TWICE A DAY AND AS NEEDED  100 each  3  . Lancets Misc. KIT 1  Device by Does not apply route 2 (two) times daily.  100 each  3  . lisinopril (PRINIVIL,ZESTRIL) 10 MG tablet TAKE 1 TABLET EVERY DAY  30 tablet  11  . nebivolol (BYSTOLIC) 5 MG tablet TAKE 1 AND 1/2 TABLET BY MOUTH 2 TIMES DAILY  90 tablet  4  . Omega-3 Fatty Acids (COROMEGA OMEGA 3 SQUEEZE PO) Take 1 packet by mouth once a week.      . progesterone (PROMETRIUM) 100 MG capsule Take 100 mg by mouth daily.      [name] Kitchen pyridOXINE (VITAMIN B-6) 100 MG tablet Take 200 mg by mouth daily.      . rosuvastatin (CRESTOR) 40 MG tablet Take 1 tablet (40 mg total) by mouth at bedtime.  30 tablet  11  . TRADJENTA 5 MG TABS tablet TAKE 1 TABLET BY MOUTH EVERY DAY  30 tablet  5  . Vitamin D, Ergocalciferol, (DRISDOL) 50000 UNITS CAPS capsule Take 1 capsule (50,000 Units total) by mouth every 7 (seven) days.  15 capsule  1   No current facility-administered medications on file prior to visit.    BP 108/54  Pulse 64  Ht 5' 3.5" (1.613 m)  Wt 191 lb (86.637 kg)  BMI 33.30 kg/m2chart     Objective:   Physical Exam  Constitutional: She is oriented to person, place, and time. She appears well-developed and well-nourished.  HENT:  Right Ear: External ear normal.  Left Ear: External ear normal.  Nose: Nose normal.  Mouth/Throat: Oropharynx is clear and moist.  Neck: Normal range of motion. Neck supple. No thyromegaly present.  Cardiovascular: Normal rate, regular rhythm and normal heart sounds.   Pulmonary/Chest: Effort normal and breath sounds normal.  Abdominal: Soft. Bowel sounds are normal.  Musculoskeletal: Normal range of motion. She exhibits no edema and no tenderness.  Neurological: She is alert and oriented to person, place, and time. She has normal reflexes. She displays normal reflexes. No cranial nerve deficit. Coordination normal.  Skin: Skin is warm and dry.  Psychiatric: She has a normal mood and affect.          Assessment & Plan:  Bonnie Chavez was seen today for follow-up.  Diagnoses and  associated orders for this visit:  Type II or unspecified type diabetes mellitus without mention of complication, not stated as uncontrolled - Hemoglobin A1c - Microalbumin/Creatinine Ratio, Urine  Essential hypertension, benign - Basic Metabolic Panel -  Hepatic Function Panel  Hypercholesteremia - Basic Metabolic Panel - Hepatic Function Panel  Chronic kidney disease, stage 1    Call the office with questions or concerns. Recheck as scheduled and as needed.

## 2014-05-24 NOTE — Patient Instructions (Signed)
Diabetes and Standards of Medical Care  Diabetes is complicated. You may find that your diabetes team includes a dietitian, nurse, diabetes educator, eye doctor, and more. To help everyone know what is going on and to help you get the care you deserve, the following schedule of care was developed to help keep you on track. Below are the tests, exams, vaccines, medicines, education, and plans you will need. HbA1c test This test shows how well you have controlled your glucose over the past 2-3 months. It is used to see if your diabetes management plan needs to be adjusted.   It is performed at least 2 times a year if you are meeting treatment goals.  It is performed 4 times a year if therapy has changed or if you are not meeting treatment goals. Blood pressure test  This test is performed at every routine medical visit. The goal is less than 140/90 mmHg for most people, but 130/80 mmHg in some cases. Ask your health care provider about your goal. Dental exam  Follow up with the dentist regularly. Eye exam  If you are diagnosed with type 1 diabetes as a child, get an exam upon reaching the age of 10 years or older and have had diabetes for 3-5 years. Yearly eye exams are recommended after that initial eye exam.  If you are diagnosed with type 1 diabetes as an adult, get an exam within 5 years of diagnosis and then yearly.  If you are diagnosed with type 2 diabetes, get an exam as soon as possible after the diagnosis and then yearly. Foot care exam  Visual foot exams are performed at every routine medical visit. The exams check for cuts, injuries, or other problems with the feet.  A comprehensive foot exam should be done yearly. This includes visual inspection as well as assessing foot pulses and testing for loss of sensation.  Check your feet nightly for cuts, injuries, or other problems with your feet. Tell your health care provider if anything is not healing. Kidney function test (urine  microalbumin)  This test is performed once a year.  Type 1 diabetes: The first test is performed 5 years after diagnosis.  Type 2 diabetes: The first test is performed at the time of diagnosis.  A serum creatinine and estimated glomerular filtration rate (eGFR) test is done once a year to assess the level of chronic kidney disease (CKD), if present. Lipid profile (cholesterol, HDL, LDL, triglycerides)  Performed every 5 years for most people.  The goal for LDL is less than 100 mg/dL. If you are at high risk, the goal is less than 70 mg/dL.  The goal for HDL is 40 mg/dL-50 mg/dL for men and 50 mg/dL-60 mg/dL for women. An HDL cholesterol of 60 mg/dL or higher gives some protection against heart disease.  The goal for triglycerides is less than 150 mg/dL. Influenza vaccine, pneumococcal vaccine, and hepatitis B vaccine  The influenza vaccine is recommended yearly.  The pneumococcal vaccine is generally given once in a lifetime. However, there are some instances when another vaccination is recommended. Check with your health care provider.  The hepatitis B vaccine is also recommended for adults with diabetes. Diabetes self-management education  Education is recommended at diagnosis and ongoing as needed. Treatment plan  Your treatment plan is reviewed at every medical visit. Document Released: 09/06/2009 Document Revised: 07/12/2013 Document Reviewed: 04/11/2013 [employer] Patient Information 2015 [employer]. This information is not intended to replace advice given to you by your   health care provider. Make sure you discuss any questions you have with your health care provider.

## 2014-05-30 DIAGNOSIS — N183 Chronic kidney disease, stage 3 unspecified: Secondary | ICD-10-CM | POA: Diagnosis not present

## 2014-06-04 ENCOUNTER — Other Ambulatory Visit: Payer: Self-pay | Admitting: Family

## 2014-06-05 DIAGNOSIS — E213 Hyperparathyroidism, unspecified: Secondary | ICD-10-CM | POA: Diagnosis not present

## 2014-06-05 DIAGNOSIS — E559 Vitamin D deficiency, unspecified: Secondary | ICD-10-CM | POA: Diagnosis not present

## 2014-06-06 LAB — PTH, INTACT AND CALCIUM: Calcium: 10.9 mg/dL — ABNORMAL HIGH (ref 8.7–10.3)

## 2014-06-07 ENCOUNTER — Ambulatory Visit (INDEPENDENT_AMBULATORY_CARE_PROVIDER_SITE_OTHER): Payer: Medicare Other | Admitting: Surgery

## 2014-06-07 ENCOUNTER — Encounter (INDEPENDENT_AMBULATORY_CARE_PROVIDER_SITE_OTHER): Payer: Self-pay | Admitting: Surgery

## 2014-06-07 NOTE — Patient Instructions (Signed)
Parathyroid Hormone This is a test to determine whether PTH levels are responding normally to changes in blood calcium levels. It also helps to distinguish the cause of calcium imbalances, and to evaluate parathyroid function. When calcium blood levels are higher or lower than normal, and when your caregiver may want to determine how well your parathyroid glands are working. Parathyroid hormone (PTH) helps the body maintain stable levels of calcium in the blood. It is part of a 'feedback loop' that includes calcium, PTH, vitamin D, and, to some extent, phosphate and magnesium. Conditions and diseases that disrupt this feedback loop can cause inappropriate elevations or decreases in calcium and PTH levels and lead to symptoms of hypercalcemia (raised blood levels of calcium) or hypocalcemia (low blood levels of calcium).  PTH is produced by four parathyroid glands that are located in the neck beside the thyroid gland. Normally, these glands secrete PTH into the bloodstream in response to low blood calcium levels. Parathyroid hormone then works in three ways to help raise blood calcium levels back to normal. It takes calcium from the body's bone, stimulates the activation of vitamin D in the kidney (which in turn increases the absorption of calcium from the intestines), and suppresses the excretion of calcium in the urine (while encouraging excretion of phosphate). As calcium levels begin to increase in the blood, PTH normally decreases. PREPARATION FOR TEST You should have nothing to eat or drink except for water after midnight on the day of the test or as directed by your caregiver. A blood sample is obtained by inserting a needle into a vein in the arm. NORMAL FINDINGS Conventional Normal  PTH intact (whole)  Assay includes intact PTH  Values (pg/mL)10-65  SI Units (ng/L)10-65  PTH N-terminalN-terminal  Values (pg/mL) 8-24  SI Units (ng/L)8-24  PTH C-terminal  Assay Includes  C-terminal  Values (pg/mL) 50-330  SI Units (ng/L) 50-330  Intact PTH  Midmolecule Ranges for normal findings may vary among different laboratories and hospitals. You should always check with your doctor after having lab work or other tests done to discuss the meaning of your test results and whether your values are considered within normal limits. MEANING OF TEST  Your caregiver will go over the test results with you and discuss the importance and meaning of your results, as well as treatment options and the need for additional tests if necessary. OBTAINING THE TEST RESULTS  It is your responsibility to obtain your test results. Ask the lab or department performing the test when and how you will get your results. Document Released: 12/12/2004 Document Revised: 02/01/2012 Document Reviewed: 10/21/2008 [facility] Patient Information 2015 [place], [place]. This information is not intended to replace advice given to you by your health care provider. Make sure you discuss any questions you have with your health care provider.

## 2014-06-07 NOTE — Progress Notes (Signed)
General [facility]  Chief Complaint  Patient presents with  . Follow-up    hypercalcemia, suspect primary hyperparathyroidism    HISTORY: Patient is a 80 year old female followed for hypercalcemia. Patient is suspected of having primary hyperparathyroidism. She has undergone an extensive workup including laboratory studies, ultrasound examination of the neck, and nuclear medicine parathyroid scan. These studies have been unrevealing of a parathyroid adenoma.  Most recent laboratory studies dated 06/05/2014 showing calcium level of 10.9 MA intact PTH level of 72 with a range of normal being 15-65.  PERTINENT REVIEW OF SYSTEMS: Denies fatigue. Arthritis pain. No recent nephrolithiasis. No recent fractures.  EXAM: HEENT: normocephalic; pupils equal and reactive; sclerae clear; dentition good; mucous membranes moist NECK:  No palpable masses in the thyroid bed; symmetric on extension; no palpable anterior or posterior cervical lymphadenopathy; no supraclavicular masses; no tenderness CHEST: clear to auscultation bilaterally without rales, rhonchi, or wheezes CARDIAC: regular rate and rhythm without significant murmur; peripheral pulses are full EXT:  non-tender without edema; no deformity NEURO: no gross focal deficits; no sign of tremor   IMPRESSION: Hypercalcemia, suspected primary hyperparathyroidism  PLAN: I reviewed the recent laboratory studies with the patient and provided her with a copy for her records. She recently completed a course of ergocalciferol.  The patient continues to have mild hypercalcemia ranging from 10.5-11.0. Her intact PTH level continues to be mildly elevated. Given her calcium level, it should be suppressed in the 20-30 range. Therefore I do suspect that she has primary hyperparathyroidism.  Patient has failed to localize on multiple studies. Her last nuclear medicine scan was in February 2014.  Patient and I discussed options  for further management. This includes neck exploration with evaluation of all 4 parathyroid glands for possible resection. Otherwise, further localization studies would include a repeat nuclear medicine parathyroid scan. The final option would be for continued observation. The patient at this time wishes continued observation. She does not wish to pursue exploratory surgery. She states that she is feeling fine. She does not wish to have further diagnostic studies at this point in time.  The consensus statement from endocrinology would agree that with a calcium level less than 11 in a patient over the age of 2 that observation is an option and less the patient is actively symptomatic. Therefore I will have to agree with the patient that continued observation may be the best course of action.  Patient will continue to see her nephrologist and her endocrinologist. I suspect that they will monitor her calcium level and her intact PTH level.  If the patient decides that surgical intervention may be required, I will be happy to see her back for further evaluation and management.  Patient will return for surgical care as needed.  Earnstine Regal, MD, [facility] Office: [phone]  Visit Diagnoses: 1. Hypercalcemia

## 2014-06-20 ENCOUNTER — Telehealth: Payer: Self-pay | Admitting: Cardiovascular Disease

## 2014-06-26 NOTE — Telephone Encounter (Signed)
Closed encounter °

## 2014-07-25 ENCOUNTER — Encounter: Payer: Self-pay | Admitting: Surgery

## 2014-08-05 ENCOUNTER — Other Ambulatory Visit: Payer: Self-pay | Admitting: Cardiovascular Disease

## 2014-08-06 NOTE — Telephone Encounter (Signed)
Refilled patient prescriptions.

## 2014-08-13 ENCOUNTER — Other Ambulatory Visit: Payer: Self-pay | Admitting: Family

## 2014-08-23 ENCOUNTER — Ambulatory Visit (INDEPENDENT_AMBULATORY_CARE_PROVIDER_SITE_OTHER): Payer: Medicare Other | Admitting: Cardiovascular Disease

## 2014-08-23 VITALS — BP 118/66 | HR 57 | Ht 63.5 in | Wt 189.2 lb

## 2014-08-23 DIAGNOSIS — E785 Hyperlipidemia, unspecified: Secondary | ICD-10-CM | POA: Diagnosis not present

## 2014-08-23 DIAGNOSIS — E1122 Type 2 diabetes mellitus with diabetic chronic kidney disease: Secondary | ICD-10-CM

## 2014-08-23 DIAGNOSIS — N183 Chronic kidney disease, stage 3 unspecified: Secondary | ICD-10-CM

## 2014-08-23 DIAGNOSIS — E042 Nontoxic multinodular goiter: Secondary | ICD-10-CM

## 2014-08-23 DIAGNOSIS — I10 Essential (primary) hypertension: Secondary | ICD-10-CM

## 2014-08-23 DIAGNOSIS — N189 Chronic kidney disease, unspecified: Secondary | ICD-10-CM

## 2014-08-23 MED ORDER — NEBIVOLOL HCL 5 MG PO TABS: ORAL_TABLET | ORAL | Status: DC

## 2014-08-23 NOTE — Patient Instructions (Signed)
Dr Claiborne Billings has recommended making the following medication changes:  CHANGE your Bystolic to - 1.5 tablets in the morning, take 1 tablet in the evening  Dr Claiborne Billings wants you to follow-up in 1 year. You will receive a reminder letter in the mail two months in advance. If you don't receive a letter, please call our office to schedule the follow-up appointment.

## 2014-08-25 ENCOUNTER — Encounter: Payer: Self-pay | Admitting: Cardiovascular Disease

## 2014-08-25 NOTE — Progress Notes (Signed)
Patient ID: Bonnie Chavez, female   DOB: 01/20/1934, 80 y.o.   MRN: [id number]      HPI: Bonnie Chavez is a 80 y.o. female presents to the office today for a one-year cardiology evaluation.  Bonnie Chavez has a history of hypertension with documented grade 1 diastolic dysfunction, hyperlipidemia, type 2 diabetes mellitus with renal insufficiency, as well as aortic valve sclerosis without stenosis. An echo Doppler study in 2011 showed an EF of 55% to she also has a history of a goiter for which he sees [name]. Sees Dr. Linna Hoff again with reference to her renal insufficiency. The past year, she has done fairly well. She denies any episodes of chest pain or shortness of breath. In 2011 she did undergo a nuclear perfusion scan which showed normal perfusion and function apparently she has seen [name] for uterine polyps and fibroids.  She has a history of hyperparathyroidism and saw Dr. Arnoldo Morale for this.  Presently, she has not had surgery in his undergoing continued surveillance of her calcium levels.  She sees Dr. Shanon Brow in 4 renal insufficiency.  Presently, she did denies chest pain.  She admits to decreased energy.  She takes Lasix to reduce potential swelling.  For her blood pressure.  She has been on diastolic 7.5 mg twice a day, lisinopril 10 mg, furosemide 40 mg daily, in addition to amlodipine, 10 mg.  She is on Tragenta 5 mg for her diabetes.  Blood work in July showed a creatinine of 1.7 with a BUN of 38.  Her calcium at that time was 10.7.  One year ago.  It had been as high as 11.6.  Hemoglobin A1c in July was 7.0.  Past Medical History  Diagnosis Date  . Hypertension   . Diabetes mellitus     type 2  . History of chicken pox   . History of measles, mumps, or rubella   . Hyperlipidemia   . Osteoarthritis   . PMB (postmenopausal bleeding) 02/28/2009    Resolved  . Vaginal atrophy   . Vitamin D deficiency   . Mitral valve prolapse   . Migraine     no longer has migraines (80's)  .  Renal insufficiency     [name]   . Aortic valve sclerosis   . Goiter     Dr. Chalmers Cater  . Heart murmur   . History of nuclear stress test 06/2010    dipyridamole; normal pattern of perfusion; ekg negative for ischemia; low risk scan     Past Surgical History  Procedure Laterality Date  . Cesarean section  1972 &1976  . Tubal ligation  1976  . Replacement total knee  2000    left  . Dilation and curettage of uterus  1975  . Cholecystectomy  1991  . Hemorrhoid surgery  1997  . Knee surgery  2000 2011    right knee replacement   . Shoulder surgery  2004 2010   . Dilatation & currettage/hysteroscopy with resectocope N/A 05/15/2013    Procedure: [place];  Surgeon: Eldred Manges, MD;  Location: [facility];  Service: Gynecology;  Laterality: N/A;  . Transthoracic echocardiogram  07/2010    EF=>55%; LA mildly dilated; trace MR/TR;    Allergies  Allergen Reactions  . Contrast Media [Iodinated Diagnostic Agents]   . Nsaids     Abnormal renal function  . Penicillins Rash    Pt has taken Keflex without difficulty    Current Outpatient Prescriptions  Medication  Sig Dispense Refill  . acetaminophen (TYLENOL) 500 MG tablet Take 1 tablet (500 mg total) by mouth every 6 (six) hours as needed for pain. May use OTC med.  Do not fill prescription  30 tablet  0  . amLODipine (NORVASC) 10 MG tablet TAKE 1 TABLET BY MOUTH AT BEDTIME  30 tablet  1  . aspirin (ECOTRIN LOW STRENGTH) 81 MG EC tablet Take 81 mg by mouth daily. Swallow whole.      Bonnie Chavez CRESTOR 40 MG tablet TAKE 1 TABLET BY MOUTH AT BEDTIME  30 tablet  1  . estradiol-norethindrone (COMBIPATCH) 0.05-0.25 MG/DAY Place 1 patch onto the skin 2 (two) times a week. Tuesday and Saturday      . furosemide (LASIX) 20 MG tablet Take 40 mg by mouth daily.      . Lancets Misc. KIT 1 Device by Does not apply route 2 (two) times daily.  100 each  3  . lisinopril (PRINIVIL,ZESTRIL) 10 MG tablet TAKE 1  TABLET BY MOUTH EVERY DAY  30 tablet  1  . nebivolol (BYSTOLIC) 5 MG tablet Take 1.5 tablets (7.5 mg total) by mouth every morning and take 1 tablet (5 mg total) by mouth every evening.  75 tablet  11  . ONE TOUCH ULTRA TEST test strip USE TWICE A DAY AND AS NEEDED  100 each  3  . progesterone (PROMETRIUM) 100 MG capsule Take 100 mg by mouth daily.      Bonnie Chavez pyridOXINE (VITAMIN B-6) 100 MG tablet Take 200 mg by mouth daily.      . TRADJENTA 5 MG TABS tablet TAKE 1 TABLET BY MOUTH EVERY DAY  30 tablet  5   No current facility-administered medications for this visit.    History   Social History  . Marital Status: Divorced    Spouse Name: N/A    Number of Children: 2  . Years of Education: N/A   Occupational History  . Not on file.   Social History Main Topics  . Smoking status: Never Smoker   . Smokeless tobacco: Not on file  . Alcohol Use: No  . Drug Use: No  . Sexual Activity: Not on file   Other Topics Concern  . Not on file   Social History Narrative  . No narrative on file   Social history is notable that she's divorced has 2 children one grandchild. She does exercise. There is no tobacco or alcohol use..  Family History  Problem Relation Age of Onset  . Hypertension Mother   . Diabetes Mother   . Hyperlipidemia Mother   . Heart attack Mother   . Heart disease Father   . Heart attack Father   . Stroke Brother   . Heart disease Brother   . Diabetes Brother   . Uterine cancer Maternal Grandmother     ROS General: Negative; No fevers, chills, or night sweats;  HEENT: Negative; No changes in vision or hearing, sinus congestion, difficulty swallowing Pulmonary: Negative; No cough, wheezing, shortness of breath, hemoptysis Cardiovascular: Negative; No chest pain, presyncope, syncope, palpitations Positive for occasional leg swelling GI: Negative; No nausea, vomiting, diarrhea, or abdominal pain GU: Negative; No dysuria, hematuria, or difficulty  voiding Musculoskeletal: Negative; no myalgias, joint pain, or weakness Hematologic/Oncology: Negative; no easy bruising, bleeding Endocrine: Positive for diabetes mellitus, positive for hyper parathyroidism Neuro: Negative; no changes in balance, headaches Skin: Negative; No rashes or skin lesions Psychiatric: Negative; No behavioral problems, depression Sleep: Negative; No snoring, daytime sleepiness, hypersomnolence,  bruxism, restless legs, hypnogognic hallucinations, no cataplexy Other comprehensive 14 point system review is negative.   PE BP 118/66  Pulse 57  Ht 5' 3.5" (1.613 m)  Wt 189 lb 3.2 oz (85.821 kg)  BMI 32.99 kg/m2  General: Alert, oriented, no distress.  Skin: normal turgor, no rashes HEENT: Normocephalic, atraumatic. Pupils round and reactive; sclera anicteric;no lid lag.  Nose without nasal septal hypertrophy Mouth/Parynx benign; Mallinpatti scale 2 Neck: Palpable thyroid, No JVD, no carotid bruits with normal carotid upstroke Lungs: clear to ausculatation and percussion; no wheezing or rales Chest wall: Nontender to palpation Heart: RRR, s1 s2 normal 2/6 systolic murmur Abdomen: soft, nontender; no hepatosplenomehaly, BS+; abdominal aorta nontender and not dilated by palpation. Back: No CVA tenderness Pulses 2+ Extremities: no clubbing cyanosis or edema, Homan's sign negative  Neurologic: grossly nonfocal Psychological: Normal affect and mood  ECG (independently read by me): Sinus bradycardia at 57 beats per minute.  First degree AV block with a PR interval of 236 ms   Prior September 2014 ECG: Sinus rhythm at 60 beats per minute. First grade AV block with PR interval of 244 ms. QTC interval 356 ms  LABS:  BMET    Component Value Date/Time   NA 135 05/24/2014 1014   K 4.2 05/24/2014 1014   CL 103 05/24/2014 1014   CO2 27 05/24/2014 1014   GLUCOSE 148* 05/24/2014 1014   BUN 38* 05/24/2014 1014   CREATININE 1.7* 05/24/2014 1014   CALCIUM 10.9* 06/05/2014 0859    GFRNONAA 29* 05/10/2013 1220   GFRAA 33* 05/10/2013 1220     Hepatic Function Panel     Component Value Date/Time   PROT 8.0 05/24/2014 1014   ALBUMIN 4.0 05/24/2014 1014   AST 25 05/24/2014 1014   ALT 15 05/24/2014 1014   ALKPHOS 44 05/24/2014 1014   BILITOT 0.6 05/24/2014 1014   BILIDIR 0.1 05/24/2014 1014     CBC    Component Value Date/Time   WBC 9.5 05/10/2013 1525   RBC 3.87 05/10/2013 1525   HGB 12.9 05/10/2013 1525   HCT 37.6 05/10/2013 1525   PLT 206 05/10/2013 1525   MCV 97.2 05/10/2013 1525   MCH 33.3 05/10/2013 1525   MCHC 34.3 05/10/2013 1525   RDW 12.8 05/10/2013 1525   LYMPHSABS 2.2 05/02/2013 1533   MONOABS 0.9 05/02/2013 1533   EOSABS 0.4 05/02/2013 1533   BASOSABS 0.0 05/02/2013 1533     BNP No results found for this basename: probnp    Lipid Panel     Component Value Date/Time   CHOL 132 01/23/2014 1008   TRIG 69.0 01/23/2014 1008   HDL 44.00 01/23/2014 1008   CHOLHDL 3 01/23/2014 1008   VLDL 13.8 01/23/2014 1008   LDLCALC 74 01/23/2014 1008     RADIOLOGY: No results found.    ASSESSMENT AND PLAN:  Ms. Petrenko is a 80 year old female with long-standing history of hypertension with grade 1 diastolic dysfunction and normal systolic function. She does have aortic valve sclerosis without stenosis or aortic insufficiency and has mild mitral annular calcification trace MR and trace TR.  She complains today of decreased energy.  She does have first degree AV block and sinus bradycardia.  I am recommending that she reduce her Bystolic from 7.5 mg twice a day to 7.5 mg in the morning and 5 mg at night.  Her blood pressure today remains stable in this mild dose adjustment hopefully, will not adversely affect this.  She's not having significant  edema.  Her current dose of Lasix, as well as amlodipine.  She is tolerating lisinopril without worsening renal function.  She sees Dr. Forest Becker for followup of her renal insufficiency.  She's not having any chest pain.  She is tolerating low-dose  baby aspirin for antiplatelet benefit.  I will see her in one year for cardiology reevaluation.  Time spent: 25 minutes   Troy Sine, MD, [facility]  08/25/2014 10:24 AM

## 2014-09-11 DIAGNOSIS — E1129 Type 2 diabetes mellitus with other diabetic kidney complication: Secondary | ICD-10-CM | POA: Diagnosis not present

## 2014-09-11 DIAGNOSIS — N2581 Secondary hyperparathyroidism of renal origin: Secondary | ICD-10-CM | POA: Diagnosis not present

## 2014-09-11 DIAGNOSIS — Z23 Encounter for immunization: Secondary | ICD-10-CM | POA: Diagnosis not present

## 2014-09-11 DIAGNOSIS — N189 Chronic kidney disease, unspecified: Secondary | ICD-10-CM | POA: Diagnosis not present

## 2014-09-11 DIAGNOSIS — N183 Chronic kidney disease, stage 3 (moderate): Secondary | ICD-10-CM | POA: Diagnosis not present

## 2014-09-11 DIAGNOSIS — D631 Anemia in chronic kidney disease: Secondary | ICD-10-CM | POA: Diagnosis not present

## 2014-09-24 ENCOUNTER — Ambulatory Visit (INDEPENDENT_AMBULATORY_CARE_PROVIDER_SITE_OTHER): Payer: Medicare Other | Admitting: Family

## 2014-09-24 ENCOUNTER — Encounter: Payer: Self-pay | Admitting: Family

## 2014-09-24 VITALS — BP 130/66 | HR 67 | Ht 63.5 in | Wt 192.0 lb

## 2014-09-24 DIAGNOSIS — N189 Chronic kidney disease, unspecified: Secondary | ICD-10-CM

## 2014-09-24 DIAGNOSIS — E7800 Pure hypercholesterolemia, unspecified: Secondary | ICD-10-CM

## 2014-09-24 DIAGNOSIS — I10 Essential (primary) hypertension: Secondary | ICD-10-CM

## 2014-09-24 DIAGNOSIS — E1122 Type 2 diabetes mellitus with diabetic chronic kidney disease: Secondary | ICD-10-CM | POA: Diagnosis not present

## 2014-09-24 LAB — LIPID PANEL
Cholesterol: 144 mg/dL (ref 0–200)
HDL: 40.1 mg/dL (ref >39.00)
LDL CALC: 90 mg/dL (ref 0–99)
NonHDL: 103.9
Total CHOL/HDL Ratio: 4
Triglycerides: 68 mg/dL (ref 0.0–149.0)
VLDL: 13.6 mg/dL (ref 0.0–40.0)

## 2014-09-24 LAB — HEMOGLOBIN A1C: Hgb A1c MFr Bld: 7.1 % — ABNORMAL HIGH (ref 4.6–6.5)

## 2014-09-24 MED ORDER — GABAPENTIN 100 MG PO CAPS: 100.0000 mg | ORAL_CAPSULE | Freq: Three times a day (TID) | ORAL | Status: DC

## 2014-09-24 NOTE — Progress Notes (Signed)
Subjective:    Patient ID: Bonnie Chavez, female    DOB: 09/13/1934, 80 y.o.   MRN: [id number]  HPI 80 year old African-American female, nonsmoker with a history of type 2 diabetes, hypertension, hyperlipidemia is in today for recheck. She sees Dr. Towanda Octave, nephrology for chronic kidney disease. Also sees cardiology and had a well checkup last week. She is concerned that her blood glucose is increasing his seen blood glucose readings fasting in the morning between 93 and 157. Also has concerns of right hand tingling and numbness as particularly at night over the last few weeks it has worsened. Has a history of degenerative disc disease of the cervical spine. Takes Tylenol that does help her symptoms thumb. Reports stiffness in her right hand. Is concerned about peripheral neuropathy.   Review of Systems  Constitutional: Negative.   HENT: Negative.   Respiratory: Negative.   Cardiovascular: Negative.   Gastrointestinal: Negative.   Endocrine: Negative.   Genitourinary: Negative.   Musculoskeletal: Negative.   Skin: Negative.   Allergic/Immunologic: Negative.   Neurological: Negative.   Hematological: Negative.   Psychiatric/Behavioral: Negative.    Past Medical History  Diagnosis Date  . Hypertension   . Diabetes mellitus     type 2  . History of chicken pox   . History of measles, mumps, or rubella   . Hyperlipidemia   . Osteoarthritis   . PMB (postmenopausal bleeding) 02/28/2009    Resolved  . Vaginal atrophy   . Vitamin D deficiency   . Mitral valve prolapse   . Migraine     no longer has migraines (80's)  . Renal insufficiency     [name]   . Aortic valve sclerosis   . Goiter     Dr. Chalmers Cater  . Heart murmur   . History of nuclear stress test 06/2010    dipyridamole; normal pattern of perfusion; ekg negative for ischemia; low risk scan     History   Social History  . Marital Status: Divorced    Spouse Name: N/A    Number of Children: 2  . Years of  Education: N/A   Occupational History  . Not on file.   Social History Main Topics  . Smoking status: Never Smoker   . Smokeless tobacco: Not on file  . Alcohol Use: No  . Drug Use: No  . Sexual Activity: Not on file   Other Topics Concern  . Not on file   Social History Narrative    Past Surgical History  Procedure Laterality Date  . Cesarean section  1972 &1976  . Tubal ligation  1976  . Replacement total knee  2000    left  . Dilation and curettage of uterus  1975  . Cholecystectomy  1991  . Hemorrhoid surgery  1997  . Knee surgery  2000 2011    right knee replacement   . Shoulder surgery  2004 2010   . Dilatation & currettage/hysteroscopy with resectocope N/A 05/15/2013    Procedure: [place];  Surgeon: Eldred Manges, MD;  Location: [facility];  Service: Gynecology;  Laterality: N/A;  . Transthoracic echocardiogram  07/2010    EF=>55%; LA mildly dilated; trace MR/TR;    Family History  Problem Relation Age of Onset  . Hypertension Mother   . Diabetes Mother   . Hyperlipidemia Mother   . Heart attack Mother   . Heart disease Father   . Heart attack Father   . Stroke Brother   .  Heart disease Brother   . Diabetes Brother   . Uterine cancer Maternal Grandmother     Allergies  Allergen Reactions  . Contrast Media [Iodinated Diagnostic Agents]   . Nsaids     Abnormal renal function  . Penicillins Rash    Pt has taken Keflex without difficulty    Current Outpatient Prescriptions on File Prior to Visit  Medication Sig Dispense Refill  . acetaminophen (TYLENOL) 500 MG tablet Take 1 tablet (500 mg total) by mouth every 6 (six) hours as needed for pain. May use OTC med.  Do not fill prescription 30 tablet 0  . amLODipine (NORVASC) 10 MG tablet TAKE 1 TABLET BY MOUTH AT BEDTIME 30 tablet 1  . aspirin (ECOTRIN LOW STRENGTH) 81 MG EC tablet Take 81 mg by mouth daily. Swallow whole.    [name] Kitchen CRESTOR 40 MG tablet TAKE 1 TABLET  BY MOUTH AT BEDTIME 30 tablet 1  . estradiol-norethindrone (COMBIPATCH) 0.05-0.25 MG/DAY Place 1 patch onto the skin 2 (two) times a week. Tuesday and Saturday    . furosemide (LASIX) 20 MG tablet Take 40 mg by mouth daily.    . Lancets Misc. KIT 1 Device by Does not apply route 2 (two) times daily. 100 each 3  . lisinopril (PRINIVIL,ZESTRIL) 10 MG tablet TAKE 1 TABLET BY MOUTH EVERY DAY 30 tablet 1  . nebivolol (BYSTOLIC) 5 MG tablet Take 1.5 tablets (7.5 mg total) by mouth every morning and take 1 tablet (5 mg total) by mouth every evening. 75 tablet 11  . ONE TOUCH ULTRA TEST test strip USE TWICE A DAY AND AS NEEDED 100 each 3  . progesterone (PROMETRIUM) 100 MG capsule Take 100 mg by mouth daily.    [name] Kitchen pyridOXINE (VITAMIN B-6) 100 MG tablet Take 200 mg by mouth daily.    . TRADJENTA 5 MG TABS tablet TAKE 1 TABLET BY MOUTH EVERY DAY 30 tablet 5   No current facility-administered medications on file prior to visit.    BP 130/66 mmHg  Pulse 67  Ht 5' 3.5" (1.613 m)  Wt 192 lb (87.091 kg)  BMI 33.47 kg/m2chart    Objective:   Physical Exam  Constitutional: She is oriented to person, place, and time.  HENT:  Right Ear: External ear normal.  Left Ear: External ear normal.  Nose: Nose normal.  Mouth/Throat: Oropharynx is clear and moist.  Eyes: Conjunctivae are normal. Pupils are equal, round, and reactive to light.  Neck: Normal range of motion. Neck supple.  Cardiovascular: Normal rate, regular rhythm and normal heart sounds.   Pulmonary/Chest: Effort normal and breath sounds normal.  Abdominal: Soft. Bowel sounds are normal.  Musculoskeletal: Normal range of motion.  Neurological: She is alert and oriented to person, place, and time.  Skin: Skin is warm and dry.  Psychiatric: She has a normal mood and affect.          Assessment & Plan:  [place] was seen today for follow-up.  Diagnoses and associated orders for this visit:  Type 2 diabetes mellitus with diabetic chronic  kidney disease - Hemoglobin A1c  Pure hypercholesterolemia - Lipid Panel  Essential hypertension  Other Orders - gabapentin (NEURONTIN) 100 MG capsule; Take 1 capsule (100 mg total) by mouth 3 (three) times daily.    Continue water aerobics. Neurontin as needed for pain in the right hand. Continue seeing specialist as scheduled. Labs obtained today. I made a copy of her labs from nephrology so that we don't duplicate those. Recheck in  3-4 months and sooner as needed. Complete physical exam next office visit

## 2014-09-24 NOTE — Patient Instructions (Signed)
Diabetes and Exercise Exercising regularly is important. It is not just about losing weight. It has many health benefits, such as:  Improving your overall fitness, flexibility, and endurance.  Increasing your bone density.  Helping with weight control.  Decreasing your body fat.  Increasing your muscle strength.  Reducing stress and tension.  Improving your overall health. People with diabetes who exercise gain additional benefits because exercise:  Reduces appetite.  Improves the body's use of blood sugar (glucose).  Helps lower or control blood glucose.  Decreases blood pressure.  Helps control blood lipids (such as cholesterol and triglycerides).  Improves the body's use of the hormone insulin by:  Increasing the body's insulin sensitivity.  Reducing the body's insulin needs.  Decreases the risk for heart disease because exercising:  Lowers cholesterol and triglycerides levels.  Increases the levels of good cholesterol (such as high-density lipoproteins [HDL]) in the body.  Lowers blood glucose levels. YOUR ACTIVITY PLAN  Choose an activity that you enjoy and set realistic goals. Your health care provider or diabetes educator can help you make an activity plan that works for you. Exercise regularly as directed by your health care provider. This includes:  Performing resistance training twice a week such as push-ups, sit-ups, lifting weights, or using resistance bands.  Performing 150 minutes of cardio exercises each week such as walking, running, or playing sports.  Staying active and spending no more than 90 minutes at one time being inactive. Even short bursts of exercise are good for you. Three 10-minute sessions spread throughout the day are just as beneficial as a single 30-minute session. Some exercise ideas include:  Taking the dog for a walk.  Taking the stairs instead of the elevator.  Dancing to your favorite song.  Doing an exercise  video.  Doing your favorite exercise with a friend. RECOMMENDATIONS FOR EXERCISING WITH TYPE 1 OR TYPE 2 DIABETES   Check your blood glucose before exercising. If blood glucose levels are greater than 240 mg/dL, check for urine ketones. Do not exercise if ketones are present.  Avoid injecting insulin into areas of the body that are going to be exercised. For example, avoid injecting insulin into:  The arms when playing tennis.  The legs when jogging.  Keep a record of:  Food intake before and after you exercise.  Expected peak times of insulin action.  Blood glucose levels before and after you exercise.  The type and amount of exercise you have done.  Review your records with your health care provider. Your health care provider will help you to develop guidelines for adjusting food intake and insulin amounts before and after exercising.  If you take insulin or oral hypoglycemic agents, watch for signs and symptoms of hypoglycemia. They include:  Dizziness.  Shaking.  Sweating.  Chills.  Confusion.  Drink plenty of water while you exercise to prevent dehydration or heat stroke. Body water is lost during exercise and must be replaced.  Talk to your health care provider before starting an exercise program to make sure it is safe for you. Remember, almost any type of activity is better than none. Document Released: 01/30/2004 Document Revised: 03/26/2014 Document Reviewed: 04/18/2013 Exit[employer] Patient Information 2015 [employer]. This information is not intended to replace advice given to you by your health care provider. Make sure you discuss any questions you have with your health care provider.  

## 2014-09-24 NOTE — Progress Notes (Signed)
Pre visit review using our clinic review tool, if applicable. No additional management support is needed unless otherwise documented below in the visit note. 

## 2014-09-25 ENCOUNTER — Other Ambulatory Visit: Payer: Self-pay | Admitting: Family

## 2014-09-25 MED ORDER — GLIPIZIDE 5 MG PO TABS: 5.0000 mg | ORAL_TABLET | Freq: Every day | ORAL | Status: DC

## 2014-09-29 ENCOUNTER — Other Ambulatory Visit: Payer: Self-pay | Admitting: Cardiovascular Disease

## 2014-10-01 NOTE — Telephone Encounter (Signed)
Rx refill sent to patient pharmacy   

## 2014-11-14 ENCOUNTER — Encounter: Payer: Self-pay | Admitting: Podiatry

## 2014-11-14 ENCOUNTER — Ambulatory Visit (INDEPENDENT_AMBULATORY_CARE_PROVIDER_SITE_OTHER): Payer: Medicare Other | Admitting: Podiatry

## 2014-11-14 ENCOUNTER — Ambulatory Visit (INDEPENDENT_AMBULATORY_CARE_PROVIDER_SITE_OTHER): Payer: Medicare Other

## 2014-11-14 VITALS — BP 161/74 | HR 77 | Resp 14 | Ht 63.5 in | Wt 192.0 lb

## 2014-11-14 DIAGNOSIS — B351 Tinea unguium: Secondary | ICD-10-CM | POA: Diagnosis not present

## 2014-11-14 DIAGNOSIS — M2012 Hallux valgus (acquired), left foot: Secondary | ICD-10-CM

## 2014-11-14 DIAGNOSIS — M722 Plantar fascial fibromatosis: Secondary | ICD-10-CM

## 2014-11-14 DIAGNOSIS — M21612 Bunion of left foot: Secondary | ICD-10-CM

## 2014-11-14 MED ORDER — TRIAMCINOLONE ACETONIDE 10 MG/ML IJ SUSP
10.0000 mg | Freq: Once | INTRAMUSCULAR | Status: AC
Start: 2014-11-14 — End: 2014-11-14
  Administered 2014-11-14: 10 mg

## 2014-11-14 NOTE — Progress Notes (Signed)
   Subjective:    Patient ID: Bonnie Chavez, female    DOB: Nov 30, 1933, 80 y.o.   MRN: [id number]  HPI Comments: Pt complains of left heel pain and swelling since November 2015, and has tried changing shoes to help with the pain.  Pt states her toenails are tender and need cutting, and she needs a diabetic foot exam.     Review of Systems  Constitutional: Positive for fatigue.  HENT: Positive for congestion and sinus pressure.   Eyes: Positive for redness.  Endocrine: Positive for cold intolerance.  Genitourinary: Positive for frequency.  Musculoskeletal: Positive for myalgias, back pain and arthralgias.  Neurological: Positive for weakness, light-headedness and numbness.       Objective:   Physical Exam        Assessment & Plan:

## 2014-11-14 NOTE — Progress Notes (Signed)
Subjective:     Patient ID: Bonnie Chavez, female   DOB: Oct 14, 1934, 80 y.o.   MRN: [id number]  HPI patient presents stating I'm having a lot of pain in my left heel I had structural bunion deformity that's given me trouble but I'm not sure about surgery and I have nail disease with long-term diabetes   Review of Systems  All other systems reviewed and are negative.      Objective:   Physical Exam  Constitutional: She is oriented to person, place, and time.  Cardiovascular: Intact distal pulses.   Musculoskeletal: Normal range of motion.  Neurological: She is oriented to person, place, and time.  Skin: Skin is warm and dry.  Nursing note and vitals reviewed.  neurovascular status found to be intact with muscle strength adequate in range of motion within normal limits subtalar joint and moderately diminished midtarsal joint. Large hyperostosis medial aspect first metatarsal head left with redness around the joint and pain in the left plantar heel at insertional point of the tendon the calcaneus with thickened toenail disease  lateral     Assessment:     HAV deformity left plantar fasciitis left and mycotic nail infection    Plan:     H&P and all conditions discussed and x-rays left reviewed. I went ahead focused on the heel today injected the left plantar fashion 30 g Kenalog 5 mg Xylocaine and dispensed fascial brace with instructions on usage and also gave instructions on diabetic foot exams and debrided her nailbeds today and discussed structural bunion which I do not recommend treatment for currently

## 2014-11-28 ENCOUNTER — Ambulatory Visit (INDEPENDENT_AMBULATORY_CARE_PROVIDER_SITE_OTHER): Payer: Medicare Other | Admitting: Podiatry

## 2014-11-28 ENCOUNTER — Encounter: Payer: Self-pay | Admitting: Podiatry

## 2014-11-28 VITALS — BP 136/68 | HR 74 | Resp 12

## 2014-11-28 DIAGNOSIS — M722 Plantar fascial fibromatosis: Secondary | ICD-10-CM

## 2014-11-28 NOTE — Patient Instructions (Signed)
Plantar Fasciitis (Heel Spur Syndrome) with Rehab The plantar fascia is a fibrous, ligament-like, soft-tissue structure that spans the bottom of the foot. Plantar fasciitis is a condition that causes pain in the foot due to inflammation of the tissue. SYMPTOMS   Pain and tenderness on the underneath side of the foot.  Pain that worsens with standing or walking. CAUSES  Plantar fasciitis is caused by irritation and injury to the plantar fascia on the underneath side of the foot. Common mechanisms of injury include:  Direct trauma to bottom of the foot.  Damage to a small nerve that runs under the foot where the main fascia attaches to the heel bone.  Stress placed on the plantar fascia due to bone spurs. RISK INCREASES WITH:   Activities that place stress on the plantar fascia (running, jumping, pivoting, or cutting).  Poor strength and flexibility.  Improperly fitted shoes.  Tight calf muscles.  Flat feet.  Failure to warm-up properly before activity.  Obesity. PREVENTION  Warm up and stretch properly before activity.  Allow for adequate recovery between workouts.  Maintain physical fitness:  Strength, flexibility, and endurance.  Cardiovascular fitness.  Maintain a health body weight.  Avoid stress on the plantar fascia.  Wear properly fitted shoes, including arch supports for individuals who have flat feet. PROGNOSIS  If treated properly, then the symptoms of plantar fasciitis usually resolve without surgery. However, occasionally surgery is necessary. RELATED COMPLICATIONS   Recurrent symptoms that may result in a chronic condition.  Problems of the lower back that are caused by compensating for the injury, such as limping.  Pain or weakness of the foot during push-off following surgery.  Chronic inflammation, scarring, and partial or complete fascia tear, occurring more often from repeated injections. TREATMENT  Treatment initially involves the use of  ice and medication to help reduce pain and inflammation. The use of strengthening and stretching exercises may help reduce pain with activity, especially stretches of the Achilles tendon. These exercises may be performed at home or with a therapist. Your caregiver may recommend that you use heel cups of arch supports to help reduce stress on the plantar fascia. Occasionally, corticosteroid injections are given to reduce inflammation. If symptoms persist for greater than 6 months despite non-surgical (conservative), then surgery may be recommended.  MEDICATION   If pain medication is necessary, then nonsteroidal anti-inflammatory medications, such as aspirin and ibuprofen, or other minor pain relievers, such as acetaminophen, are often recommended.  Do not take pain medication within 7 days before surgery.  Prescription pain relievers may be given if deemed necessary by your caregiver. Use only as directed and only as much as you need.  Corticosteroid injections may be given by your caregiver. These injections should be reserved for the most serious cases, because they may only be given a certain number of times. HEAT AND COLD  Cold treatment (icing) relieves pain and reduces inflammation. Cold treatment should be applied for 10 to 15 minutes every 2 to 3 hours for inflammation and pain and immediately after any activity that aggravates your symptoms. Use ice packs or massage the area with a piece of ice (ice massage).  Heat treatment may be used prior to performing the stretching and strengthening activities prescribed by your caregiver, physical therapist, or athletic trainer. Use a heat pack or soak the injury in warm water. SEEK IMMEDIATE MEDICAL CARE IF:  Treatment seems to offer no benefit, or the condition worsens.  Any medications produce adverse side effects. EXERCISES RANGE   OF MOTION (ROM) AND STRETCHING EXERCISES - Plantar Fasciitis (Heel Spur Syndrome) These exercises may help you  when beginning to rehabilitate your injury. Your symptoms may resolve with or without further involvement from your physician, physical therapist or athletic trainer. While completing these exercises, remember:   Restoring tissue flexibility helps normal motion to return to the joints. This allows healthier, less painful movement and activity.  An effective stretch should be held for at least 30 seconds.  A stretch should never be painful. You should only feel a gentle lengthening or release in the stretched tissue. RANGE OF MOTION - Toe Extension, Flexion  Sit with your right / left leg crossed over your opposite knee.  Grasp your toes and gently pull them back toward the top of your foot. You should feel a stretch on the bottom of your toes and/or foot.  Hold this stretch for __________ seconds.  Now, gently pull your toes toward the bottom of your foot. You should feel a stretch on the top of your toes and or foot.  Hold this stretch for __________ seconds. Repeat __________ times. Complete this stretch __________ times per day.  RANGE OF MOTION - Ankle Dorsiflexion, Active Assisted  Remove shoes and sit on a chair that is preferably not on a carpeted surface.  Place right / left foot under knee. Extend your opposite leg for support.  Keeping your heel down, slide your right / left foot back toward the chair until you feel a stretch at your ankle or calf. If you do not feel a stretch, slide your bottom forward to the edge of the chair, while still keeping your heel down.  Hold this stretch for __________ seconds. Repeat __________ times. Complete this stretch __________ times per day.  STRETCH - Gastroc, Standing  Place hands on wall.  Extend right / left leg, keeping the front knee somewhat bent.  Slightly point your toes inward on your back foot.  Keeping your right / left heel on the floor and your knee straight, shift your weight toward the wall, not allowing your back to  arch.  You should feel a gentle stretch in the right / left calf. Hold this position for __________ seconds. Repeat __________ times. Complete this stretch __________ times per day. STRETCH - Soleus, Standing  Place hands on wall.  Extend right / left leg, keeping the other knee somewhat bent.  Slightly point your toes inward on your back foot.  Keep your right / left heel on the floor, bend your back knee, and slightly shift your weight over the back leg so that you feel a gentle stretch deep in your back calf.  Hold this position for __________ seconds. Repeat __________ times. Complete this stretch __________ times per day. STRETCH - Gastrocsoleus, Standing  Note: This exercise can place a lot of stress on your foot and ankle. Please complete this exercise only if specifically instructed by your caregiver.   Place the ball of your right / left foot on a step, keeping your other foot firmly on the same step.  Hold on to the wall or a rail for balance.  Slowly lift your other foot, allowing your body weight to press your heel down over the edge of the step.  You should feel a stretch in your right / left calf.  Hold this position for __________ seconds.  Repeat this exercise with a slight bend in your right / left knee. Repeat __________ times. Complete this stretch __________ times per day.    STRENGTHENING EXERCISES - Plantar Fasciitis (Heel Spur Syndrome)  These exercises may help you when beginning to rehabilitate your injury. They may resolve your symptoms with or without further involvement from your physician, physical therapist or athletic trainer. While completing these exercises, remember:   Muscles can gain both the endurance and the strength needed for everyday activities through controlled exercises.  Complete these exercises as instructed by your physician, physical therapist or athletic trainer. Progress the resistance and repetitions only as guided. STRENGTH -  Towel Curls  Sit in a chair positioned on a non-carpeted surface.  Place your foot on a towel, keeping your heel on the floor.  Pull the towel toward your heel by only curling your toes. Keep your heel on the floor.  If instructed by your physician, physical therapist or athletic trainer, add ____________________ at the end of the towel. Repeat __________ times. Complete this exercise __________ times per day. STRENGTH - Ankle Inversion  Secure one end of a rubber exercise band/tubing to a fixed object (table, pole). Loop the other end around your foot just before your toes.  Place your fists between your knees. This will focus your strengthening at your ankle.  Slowly, pull your big toe up and in, making sure the band/tubing is positioned to resist the entire motion.  Hold this position for __________ seconds.  Have your muscles resist the band/tubing as it slowly pulls your foot back to the starting position. Repeat __________ times. Complete this exercises __________ times per day.  Document Released: 11/09/2005 Document Revised: 02/01/2012 Document Reviewed: 02/21/2009 [employer] Patient Information 2015 [employer]. This information is not intended to replace advice given to you by your health care provider. Make sure you discuss any questions you have with your health care provider.  

## 2014-11-28 NOTE — Progress Notes (Signed)
Patient ID: Bonnie Chavez, female   DOB: 1934/10/26, 81 y.o.   MRN: [id number]  Subjective: This patient presents for follow-up care for left plantar fasciitis. She had a Kenalog Injection on the visit of 11/14/2014 and is currently wearing a fasciitis strap. She says the left heels considerably better.  She denies any change in her health history or medications since the visit of 11/14/2014  Orientated 3  Objective: Vascular: DP and PT pulses 2/4 bilaterally  Neurological: Deferred  Dermatological: Texture and turgor within normal limits No skin lesions noted  Musculoskeletal: HAV deformities bilaterally Hammertoe deformity second bilaterally Mild palpable tenderness medial plantar fascial insertional area left without any palpable lesions  Assessment: Improving plantar fasciitis left  Plan: Advised patient to wear a shoe that has a rigid counter and a wedge heel at all times Continue wearing plantar fasciitis left Advised patient to continue stretching shoe sizes for plantar fasciitis left  Reappoint at patient's request

## 2014-12-04 ENCOUNTER — Telehealth: Payer: Self-pay | Admitting: Family

## 2014-12-04 NOTE — Telephone Encounter (Signed)
Pt managed at [facility].   Endocrinology referral for diabetes management??  Please advise.

## 2014-12-04 NOTE — Telephone Encounter (Signed)
Patient Name: Bonnie Chavez  DOB: Apr 08, 1934    Nurse Assessment  Nurse: Marcelline Deist, RN, Lynda Date/Time (Eastern Time): 12/04/2014 10:58:17 AM  Confirm and document reason for call. If symptomatic, describe symptoms. ---Caller states she started on medication in November -Glipizide, now seems to be retaining too much fluid. All over swelling, mostly in ankles & abdomen. Has kidney issues as well, on Lasix.  Has the patient traveled out of the country within the last 30 days? ---Not Applicable  Does the patient require triage? ---Yes  Related visit to physician within the last 2 weeks? ---No  Does the PT have any chronic conditions? (i.e. diabetes, asthma, etc.) ---Yes  List chronic conditions. ---diabetes, kidney issues, high BP & cholesterol     Guidelines    Guideline Title Affirmed Question Affirmed Notes  Ankle Swelling Swollen ankle joint (all triage questions negative) (Exception: area of localized swelling which is itchy)    Final Disposition User   See PCP When Office is Open (within 3 days) Marcelline Deist, RN, [employer] mentioned that she keeps a log of her blood sugars & that in the mornings, she notices her blood sugar dropping about 3 - 3.5 hrs. after eating. She feels a little shaky, like her sugar is too low. Would like for her Dr. to be aware of this & also the fluid she seems to be retaining. Nurse unable to get her in within 3 days to see her Dr. [name] Kitchen she would prefer to see her Dr. as opposed to other providers. If possible, pt. would like her Dr. to speak with her about maybe adjusting medications to address the fluid & blood sugars.

## 2014-12-04 NOTE — Telephone Encounter (Signed)
Decrease Glipizide to 1/2 tab

## 2014-12-05 ENCOUNTER — Other Ambulatory Visit: Payer: Self-pay | Admitting: Family

## 2014-12-05 NOTE — Telephone Encounter (Signed)
Pt aware and clarified on read back

## 2014-12-05 NOTE — Telephone Encounter (Signed)
Left message for pt to call back  °

## 2015-01-23 ENCOUNTER — Encounter: Payer: Medicare Other | Admitting: Family

## 2015-01-25 DIAGNOSIS — E049 Nontoxic goiter, unspecified: Secondary | ICD-10-CM | POA: Diagnosis not present

## 2015-01-25 DIAGNOSIS — N182 Chronic kidney disease, stage 2 (mild): Secondary | ICD-10-CM | POA: Diagnosis not present

## 2015-01-25 DIAGNOSIS — E559 Vitamin D deficiency, unspecified: Secondary | ICD-10-CM | POA: Diagnosis not present

## 2015-01-25 DIAGNOSIS — E118 Type 2 diabetes mellitus with unspecified complications: Secondary | ICD-10-CM | POA: Diagnosis not present

## 2015-01-27 ENCOUNTER — Other Ambulatory Visit: Payer: Self-pay | Admitting: Family

## 2015-01-29 ENCOUNTER — Other Ambulatory Visit: Payer: Self-pay | Admitting: Family

## 2015-01-30 DIAGNOSIS — E213 Hyperparathyroidism, unspecified: Secondary | ICD-10-CM | POA: Diagnosis not present

## 2015-01-30 DIAGNOSIS — I10 Essential (primary) hypertension: Secondary | ICD-10-CM | POA: Diagnosis not present

## 2015-01-30 DIAGNOSIS — E119 Type 2 diabetes mellitus without complications: Secondary | ICD-10-CM | POA: Diagnosis not present

## 2015-01-30 DIAGNOSIS — E1129 Type 2 diabetes mellitus with other diabetic kidney complication: Secondary | ICD-10-CM | POA: Diagnosis not present

## 2015-02-01 DIAGNOSIS — E119 Type 2 diabetes mellitus without complications: Secondary | ICD-10-CM | POA: Diagnosis not present

## 2015-02-01 DIAGNOSIS — H2513 Age-related nuclear cataract, bilateral: Secondary | ICD-10-CM | POA: Diagnosis not present

## 2015-02-01 LAB — HM DIABETES EYE EXAM

## 2015-02-14 ENCOUNTER — Encounter: Payer: Self-pay | Admitting: Family

## 2015-02-28 ENCOUNTER — Ambulatory Visit (INDEPENDENT_AMBULATORY_CARE_PROVIDER_SITE_OTHER): Payer: Medicare Other | Admitting: Family Medicine

## 2015-02-28 ENCOUNTER — Encounter: Payer: Self-pay | Admitting: Family Medicine

## 2015-02-28 VITALS — BP 120/62 | HR 70 | Temp 98.3°F | Ht 64.5 in | Wt 196.3 lb

## 2015-02-28 DIAGNOSIS — Z0000 Encounter for general adult medical examination without abnormal findings: Secondary | ICD-10-CM | POA: Diagnosis not present

## 2015-02-28 DIAGNOSIS — E785 Hyperlipidemia, unspecified: Secondary | ICD-10-CM | POA: Diagnosis not present

## 2015-02-28 DIAGNOSIS — Z23 Encounter for immunization: Secondary | ICD-10-CM | POA: Diagnosis not present

## 2015-02-28 DIAGNOSIS — E1122 Type 2 diabetes mellitus with diabetic chronic kidney disease: Secondary | ICD-10-CM

## 2015-02-28 DIAGNOSIS — N183 Chronic kidney disease, stage 3 unspecified: Secondary | ICD-10-CM

## 2015-02-28 DIAGNOSIS — I159 Secondary hypertension, unspecified: Secondary | ICD-10-CM

## 2015-02-28 LAB — LIPID PANEL
CHOL/HDL RATIO: 3
Cholesterol: 146 mg/dL (ref 0–200)
HDL: 42.6 mg/dL (ref >39.00)
LDL CALC: 88 mg/dL (ref 0–99)
NonHDL: 103.4
TRIGLYCERIDES: 78 mg/dL (ref 0.0–149.0)
VLDL: 15.6 mg/dL (ref 0.0–40.0)

## 2015-02-28 MED ORDER — ONETOUCH ULTRA MINI W/DEVICE KIT: PACK | Status: DC

## 2015-02-28 NOTE — Addendum Note (Signed)
Addended by: Agnes Lawrence on: 02/28/2015 09:49 AM   Modules accepted: Orders

## 2015-02-28 NOTE — Progress Notes (Signed)
HPI:  Bonnie Chavez is a 81 yo F patient of [facility] here for her CPE as she was scheduled with [facility] but Abby Potash is not longer here. She sees [name] for gyn exams. Sees Dr Chalmers Cater for her diabetes and her hyperparathyroidism and thyroid goiter. She sees [name] for her renal disease. Sees Dr. Claiborne Billings for her cardiac issues and reports does cholesterol panel with him. Sees Dr. Maureen Ralphs and Dr. Onnie Graham for her orthopedic issues. She reports she is feeling well overall. She denies CP, SOB, DOE, fevers, weight changes, blood in bowels or bowel changes, dysuria, depression or falls. She bring her extensive lab results from Dr. Chalmers Cater that sh just did 01/25/2015 with hgb a1c of 6.7 [facility] Eats a healthy diet.  ROS: See pertinent positives and negatives per HPI.  Past Medical History  Diagnosis Date  . Hypertension   . Diabetes mellitus     type 2  . History of chicken pox   . History of measles, mumps, or rubella   . Hyperlipidemia   . Osteoarthritis   . PMB (postmenopausal bleeding) 02/28/2009    Resolved  . Vaginal atrophy   . Vitamin D deficiency   . Mitral valve prolapse   . Migraine     no longer has migraines (80's)  . Renal insufficiency     [name]   . Aortic valve sclerosis   . Goiter     Dr. Chalmers Cater  . Heart murmur   . History of nuclear stress test 06/2010    dipyridamole; normal pattern of perfusion; ekg negative for ischemia; low risk scan     Past Surgical History  Procedure Laterality Date  . Cesarean section  1972 &1976  . Tubal ligation  1976  . Replacement total knee  2000    left  . Dilation and curettage of uterus  1975  . Cholecystectomy  1991  . Hemorrhoid surgery  1997  . Knee surgery  2000 2011    right knee replacement   . Shoulder surgery  2004 2010   . Dilatation & currettage/hysteroscopy with resectocope N/A 05/15/2013    Procedure: Jerome;  Surgeon:  Eldred Manges, MD;  Location: [facility];  Service: Gynecology;  Laterality: N/A;  . Transthoracic echocardiogram  07/2010    EF=>55%; LA mildly dilated; trace MR/TR;    Family History  Problem Relation Age of Onset  . Hypertension Mother   . Diabetes Mother   . Hyperlipidemia Mother   . Heart attack Mother   . Heart disease Father   . Heart attack Father   . Stroke Brother   . Heart disease Brother   . Diabetes Brother   . Uterine cancer Maternal Grandmother     History   Social History  . Marital Status: Divorced    Spouse Name: N/A  . Number of Children: 2  . Years of Education: N/A   Social History Main Topics  . Smoking status: Never Smoker   . Smokeless tobacco: Not on file  . Alcohol Use: No  . Drug Use: No  . Sexual Activity: Not on file   Other Topics Concern  . None   Social History Narrative     Current outpatient prescriptions:  .  acetaminophen (TYLENOL) 500 MG tablet, Take 1 tablet (500 mg total) by mouth every 6 (six) hours as needed for pain. May use OTC med.  Do not fill prescription,  Disp: 30 tablet, Rfl: 0 .  amLODipine (NORVASC) 10 MG tablet, TAKE 1 TABLET BY MOUTH AT BEDTIME, Disp: 30 tablet, Rfl: 5 .  aspirin (ECOTRIN LOW STRENGTH) 81 MG EC tablet, Take 81 mg by mouth daily. Swallow whole., Disp: , Rfl:  .  cephALEXin (KEFLEX) 500 MG capsule, , Disp: , Rfl: 0 .  CRESTOR 40 MG tablet, TAKE 1 TABLET AT BEDTIME, Disp: 30 tablet, Rfl: 5 .  estradiol-norethindrone (COMBIPATCH) 0.05-0.25 MG/DAY, Place 1 patch onto the skin 2 (two) times a week. Tuesday and Saturday, Disp: , Rfl:  .  furosemide (LASIX) 20 MG tablet, Take 40 mg by mouth daily., Disp: , Rfl:  .  gabapentin (NEURONTIN) 100 MG capsule, Take 1 capsule (100 mg total) by mouth 3 (three) times daily., Disp: 90 capsule, Rfl: 3 .  Lancets (ONETOUCH ULTRASOFT) lancets, , Disp: , Rfl: 3 .  Lancets Misc. KIT, 1 Device by Does not apply route 2 (two) times daily., Disp: 100 each, Rfl: 3 .   lisinopril (PRINIVIL,ZESTRIL) 10 MG tablet, TAKE 1 TABLET BY MOUTH EVERY DAY, Disp: 30 tablet, Rfl: 5 .  nebivolol (BYSTOLIC) 5 MG tablet, Take 1.5 tablets (7.5 mg total) by mouth every morning and take 1 tablet (5 mg total) by mouth every evening., Disp: 75 tablet, Rfl: 11 .  ONE TOUCH ULTRA TEST test strip, USE TWICE A DAY AND AS NEEDED, Disp: 100 each, Rfl: 3 .  progesterone (PROMETRIUM) 100 MG capsule, Take 100 mg by mouth daily., Disp: , Rfl:  .  pyridOXINE (VITAMIN B-6) 100 MG tablet, Take 200 mg by mouth daily., Disp: , Rfl:  .  TRADJENTA 5 MG TABS tablet, TAKE 1 TABLET BY MOUTH EVERY DAY, Disp: 90 tablet, Rfl: 1  EXAM:  Filed Vitals:   02/28/15 0813  BP: 120/62  Pulse: 70  Temp: 98.3 F (36.8 C)    Body mass index is 33.19 kg/(m^2).  GENERAL: vitals reviewed and listed above, alert, oriented, appears well hydrated and in no acute distress  HEENT: atraumatic, conjunttiva clear, no obvious abnormalities on inspection of external nose and ears, normal appearance of ear canals and TMs, clear nasal congestion, mild post oropharyngeal erythema with PND, no tonsillar edema or exudate, no sinus TTP  NECK: no obvious masses on inspection  LUNGS: clear to auscultation bilaterally, no wheezes, rales or rhonchi, good air movement  CV: HRRR, no peripheral edema  BREAST: declined  GU: declined  ABD: BS+, soft, NTTP  MS: moves all extremities without noticeable abnormality  PSYCH: pleasant and cooperative, no obvious depression or anxiety  ASSESSMENT AND PLAN:  Discussed the following assessment and plan:  Visit for preventive health examination - Plan: Lipid Panel  Type 2 diabetes mellitus with diabetic chronic kidney disease  Secondary hypertension, unspecified  Chronic kidney disease, stage 3 (moderate)  Hyperlipidemia - Plan: Lipid Panel  -all USPSTF recs discussed and reveiwed -prevnar 13 -refills as needed -follow up as scheduled for npv -of course, we advised  to return or notify a doctor immediately if symptoms worsen or persist or new concerns arise.    Patient Instructions  BEFORE YOU LEAVE: -prevnar 13 -please have Wendie Simmer update your vaccines with pneumococcal in 07/25/2011  -please change your phone number as needed up front  Keep the appointment that is scheduled  We recommend the following healthy lifestyle measures: - eat a healthy diet consisting of lots of vegetables, fruits, beans, nuts, seeds, healthy meats such as white chicken and fish and whole grains.  -  avoid fried foods, fast food, processed foods, sodas, red meet and other fattening foods.  - get a least 150 minutes of aerobic exercise per week.       Colin Benton R.

## 2015-02-28 NOTE — Progress Notes (Signed)
Pre visit review using our clinic review tool, if applicable. No additional management support is needed unless otherwise documented below in the visit note. 

## 2015-02-28 NOTE — Progress Notes (Signed)
Son Bonnie Chavez (son) [phone]

## 2015-02-28 NOTE — Patient Instructions (Addendum)
BEFORE YOU LEAVE: -prevnar 13 -please have Wendie Simmer update your vaccines with pneumococcal in 07/25/2011  -please change your phone number as needed up front  Keep the appointment that is scheduled  We recommend the following healthy lifestyle measures: - eat a healthy diet consisting of lots of vegetables, fruits, beans, nuts, seeds, healthy meats such as white chicken and fish and whole grains.  - avoid fried foods, fast food, processed foods, sodas, red meet and other fattening foods.  - get a least 150 minutes of aerobic exercise per week.

## 2015-04-02 ENCOUNTER — Other Ambulatory Visit: Payer: Self-pay | Admitting: Cardiovascular Disease

## 2015-04-02 ENCOUNTER — Other Ambulatory Visit: Payer: Self-pay

## 2015-04-02 DIAGNOSIS — Z1231 Encounter for screening mammogram for malignant neoplasm of breast: Secondary | ICD-10-CM

## 2015-04-02 NOTE — Telephone Encounter (Signed)
Rx(s) sent to pharmacy electronically.  

## 2015-04-17 DIAGNOSIS — Z780 Asymptomatic menopausal state: Secondary | ICD-10-CM | POA: Diagnosis not present

## 2015-04-17 DIAGNOSIS — N951 Menopausal and female climacteric states: Secondary | ICD-10-CM | POA: Diagnosis not present

## 2015-04-17 DIAGNOSIS — Z6832 Body mass index (BMI) 32.0-32.9, adult: Secondary | ICD-10-CM | POA: Diagnosis not present

## 2015-04-17 DIAGNOSIS — Z01419 Encounter for gynecological examination (general) (routine) without abnormal findings: Secondary | ICD-10-CM | POA: Diagnosis not present

## 2015-04-17 DIAGNOSIS — R3915 Urgency of urination: Secondary | ICD-10-CM | POA: Diagnosis not present

## 2015-04-22 ENCOUNTER — Other Ambulatory Visit: Payer: Self-pay | Admitting: Family

## 2015-05-06 ENCOUNTER — Ambulatory Visit
Admission: RE | Admit: 2015-05-06 | Discharge: 2015-05-06 | Disposition: A | Discharge status: Home / Self Care | Payer: Medicare Other | Source: Ambulatory Visit

## 2015-05-06 DIAGNOSIS — Z1231 Encounter for screening mammogram for malignant neoplasm of breast: Secondary | ICD-10-CM | POA: Diagnosis not present

## 2015-05-08 ENCOUNTER — Other Ambulatory Visit: Payer: Self-pay | Admitting: Obstetrics and Gynecology

## 2015-05-08 DIAGNOSIS — R928 Other abnormal and inconclusive findings on diagnostic imaging of breast: Secondary | ICD-10-CM

## 2015-05-15 ENCOUNTER — Other Ambulatory Visit: Payer: Self-pay | Admitting: Family

## 2015-05-20 ENCOUNTER — Ambulatory Visit
Admission: RE | Admit: 2015-05-20 | Discharge: 2015-05-20 | Disposition: A | Discharge status: Home / Self Care | Payer: Medicare Other | Source: Ambulatory Visit | Attending: Obstetrics and Gynecology | Admitting: Obstetrics and Gynecology

## 2015-05-20 DIAGNOSIS — R928 Other abnormal and inconclusive findings on diagnostic imaging of breast: Secondary | ICD-10-CM

## 2015-08-05 DIAGNOSIS — Z96611 Presence of right artificial shoulder joint: Secondary | ICD-10-CM | POA: Diagnosis not present

## 2015-08-05 DIAGNOSIS — Z471 Aftercare following joint replacement surgery: Secondary | ICD-10-CM | POA: Diagnosis not present

## 2015-08-05 DIAGNOSIS — Z96612 Presence of left artificial shoulder joint: Secondary | ICD-10-CM | POA: Diagnosis not present

## 2015-08-13 ENCOUNTER — Ambulatory Visit (INDEPENDENT_AMBULATORY_CARE_PROVIDER_SITE_OTHER): Payer: Medicare Other | Admitting: Cardiovascular Disease

## 2015-08-13 VITALS — BP 126/62 | HR 60 | Ht 64.0 in | Wt 195.3 lb

## 2015-08-13 DIAGNOSIS — R011 Cardiac murmur, unspecified: Secondary | ICD-10-CM

## 2015-08-13 DIAGNOSIS — I358 Other nonrheumatic aortic valve disorders: Secondary | ICD-10-CM

## 2015-08-13 DIAGNOSIS — E785 Hyperlipidemia, unspecified: Secondary | ICD-10-CM | POA: Diagnosis not present

## 2015-08-13 DIAGNOSIS — N183 Chronic kidney disease, stage 3 unspecified: Secondary | ICD-10-CM

## 2015-08-13 DIAGNOSIS — I10 Essential (primary) hypertension: Secondary | ICD-10-CM | POA: Diagnosis not present

## 2015-08-13 MED ORDER — NEBIVOLOL HCL 5 MG PO TABS
ORAL_TABLET | ORAL | Status: DC
Start: 2015-08-13 — End: 2015-10-28

## 2015-08-13 MED ORDER — NEBIVOLOL HCL 5 MG PO TABS: ORAL_TABLET | ORAL | Status: DC

## 2015-08-13 NOTE — Patient Instructions (Signed)
Your physician has recommended you make the following change in your medication: the Bystolic has been decreased to 1 tablet twice a day.  Your physician has requested that you have an echocardiogram. Echocardiography is a painless test that uses sound waves to create images of your heart. It provides your doctor with information about the size and shape of your heart and how well your heart's chambers and valves are working. This procedure takes approximately one hour. There are no restrictions for this procedure.   Your physician wants you to follow-up in: 6 months or sooner if needed with Dr. Claiborne Billings. You will receive a reminder letter in the mail two months in advance. If you don't receive a letter, please call our office to schedule the follow-up appointment.

## 2015-08-15 ENCOUNTER — Encounter: Payer: Self-pay | Admitting: Cardiovascular Disease

## 2015-08-15 DIAGNOSIS — I358 Other nonrheumatic aortic valve disorders: Secondary | ICD-10-CM | POA: Insufficient documentation

## 2015-08-15 NOTE — Progress Notes (Signed)
Patient ID: Bonnie Chavez, female   DOB: Mar 06, 1934, 81 y.o.   MRN: [id number]      HPI: Bonnie Chavez is a 81 y.o. female presents to the office today for a one-year cardiology evaluation.  Bonnie Chavez has a history of hypertension with documented grade 1 diastolic dysfunction, hyperlipidemia, type 2 diabetes mellitus with renal insufficiency, as well as aortic valve sclerosis without stenosis. An echo Doppler study in 2011 showed an EF of 55%.  In 2011 a nuclear perfusion scan showed normal perfusion and function.  She has a history of a goiter for which she sees [name], [name] for her renal insufficiency, [name] for uterine polyps and fibroids.  She has a history of hyperparathyroidism and saw [name]; she has not had surgery in his undergoing continued surveillance of her calcium levels.  Presently, she did denies chest pain.  She admits to decreased energy.  She denies PND, orthopnea.  She denies palpitations.  She takes Lasix to reduce potential swelling.  For her blood pressure.  She has been on Bystolic 7.5 mg twice a day, lisinopril 10 mg, furosemide 40 mg daily, in addition to amlodipine, 10 mg.  She is on Tragenta 5 mg for her diabetes.  She has been on Crestor 40 mg for hyperlipidemia.  She was simply saw Dr. Redmond Pulling and follow-up laboratory had shown a BUN 27, creatinine 1.7.  Her hemoglobin A1c had improved to 6.7.  Past Medical History  Diagnosis Date  . Hypertension   . Diabetes mellitus     type 2  . History of chicken pox   . History of measles, mumps, or rubella   . Hyperlipidemia   . Osteoarthritis   . PMB (postmenopausal bleeding) 02/28/2009    Resolved  . Vaginal atrophy   . Vitamin D deficiency   . Mitral valve prolapse   . Migraine     no longer has migraines (80's)  . Renal insufficiency     [name]   . Aortic valve sclerosis   . Goiter     Dr. Chalmers Cater  . Heart murmur   . History of nuclear stress test 06/2010    dipyridamole; normal  pattern of perfusion; ekg negative for ischemia; low risk scan     Past Surgical History  Procedure Laterality Date  . Cesarean section  1972 &1976  . Tubal ligation  1976  . Replacement total knee  2000    left  . Dilation and curettage of uterus  1975  . Cholecystectomy  1991  . Hemorrhoid surgery  1997  . Knee surgery  2000 2011    right knee replacement   . Shoulder surgery  2004 2010   . Dilatation & currettage/hysteroscopy with resectocope N/A 05/15/2013    Procedure: [place];  Surgeon: Eldred Manges, MD;  Location: [facility];  Service: Gynecology;  Laterality: N/A;  . Transthoracic echocardiogram  07/2010    EF=>55%; LA mildly dilated; trace MR/TR;    Allergies  Allergen Reactions  . Contrast Media [Iodinated Diagnostic Agents]   . Nsaids     Abnormal renal function  . Penicillins Rash    Pt has taken Keflex without difficulty    Current Outpatient Prescriptions  Medication Sig Dispense Refill  . acetaminophen (TYLENOL) 500 MG tablet Take 1 tablet (500 mg total) by mouth every 6 (six) hours as needed for pain. May use OTC med.  Do not fill prescription 30 tablet 0  . amLODipine (NORVASC)  10 MG tablet Take 1 tablet (10 mg total) by mouth at bedtime. 30 tablet 5  . aspirin (ECOTRIN LOW STRENGTH) 81 MG EC tablet Take 81 mg by mouth daily. Swallow whole.    . Blood Glucose Monitoring Suppl (ONE TOUCH ULTRA MINI) W/DEVICE KIT Use as directed 1 each 0  . cephALEXin (KEFLEX) 500 MG capsule   0  . estradiol (VIVELLE-DOT) 0.05 MG/24HR patch Place 0.05 patches onto the skin 2 (two) times a week.  12  . furosemide (LASIX) 20 MG tablet Take 40 mg by mouth daily.    [name] Kitchen gabapentin (NEURONTIN) 100 MG capsule TAKE 1 CAPSULE (100 MG TOTAL) BY MOUTH 3 (THREE) TIMES DAILY. 90 capsule 3  . Lancets Misc. KIT 1 Device by Does not apply route 2 (two) times daily. 100 each 3  . lisinopril (PRINIVIL,ZESTRIL) 10 MG tablet Take 1 tablet (10 mg total) by  mouth daily. 30 tablet 5  . nebivolol (BYSTOLIC) 5 MG tablet Take 1 tablet twice a day 60 tablet 11  . ONE TOUCH ULTRA TEST test strip USE TWICE A DAY AND AS NEEDED 100 each 3  . progesterone (PROMETRIUM) 100 MG capsule Take 100 mg by mouth daily.    [name] Kitchen pyridOXINE (VITAMIN B-6) 100 MG tablet Take 200 mg by mouth daily.    . rosuvastatin (CRESTOR) 40 MG tablet Take 1 tablet (40 mg total) by mouth at bedtime. 30 tablet 5  . TRADJENTA 5 MG TABS tablet TAKE 1 TABLET BY MOUTH EVERY DAY 90 tablet 1   No current facility-administered medications for this visit.    Social History   Social History  . Marital Status: Divorced    Spouse Name: N/A  . Number of Children: 2  . Years of Education: N/A   Occupational History  . Not on file.   Social History Main Topics  . Smoking status: Never Smoker   . Smokeless tobacco: Not on file  . Alcohol Use: No  . Drug Use: No  . Sexual Activity: Not on file   Other Topics Concern  . Not on file   Social History Narrative   Social history is notable that she's divorced has 2 children one grandchild. She does exercise. There is no tobacco or alcohol use..  Family History  Problem Relation Age of Onset  . Hypertension Mother   . Diabetes Mother   . Hyperlipidemia Mother   . Heart attack Mother   . Heart disease Father   . Heart attack Father   . Stroke Brother   . Heart disease Brother   . Diabetes Brother   . Uterine cancer Maternal Grandmother     ROS General: Negative; No fevers, chills, or night sweats;  HEENT: Negative; No changes in vision or hearing, sinus congestion, difficulty swallowing Pulmonary: Negative; No cough, wheezing, shortness of breath, hemoptysis Cardiovascular: Negative; No chest pain, presyncope, syncope, palpitations Positive for occasional leg swelling GI: Negative; No nausea, vomiting, diarrhea, or abdominal pain GU: Negative; No dysuria, hematuria, or difficulty voiding Musculoskeletal: Negative; no  myalgias, joint pain, or weakness Hematologic/Oncology: Negative; no easy bruising, bleeding Endocrine: Positive for diabetes mellitus, positive for hyperparathyroidism Neuro: Negative; no changes in balance, headaches Skin: Negative; No rashes or skin lesions Psychiatric: Negative; No behavioral problems, depression Sleep: Negative; No snoring, daytime sleepiness, hypersomnolence, bruxism, restless legs, hypnogognic hallucinations, no cataplexy Other comprehensive 14 point system review is negative.   PE BP 126/62 mmHg  Pulse 60  Ht $R'5\' 4"'tO$  (1.626 m)  Wt 195  lb 4.8 oz (88.587 kg)  BMI 33.51 kg/m2  Repeat blood pressure by me was 122/68 supine and 122/70 standing.  There was no orthostatic pulse rise. General: Alert, oriented, no distress.  Skin: normal turgor, no rashes HEENT: Normocephalic, atraumatic. Pupils round and reactive; sclera anicteric;no lid lag.  Nose without nasal septal hypertrophy Mouth/Parynx benign; Mallinpatti scale 2 Neck: Palpable thyroid, No JVD, no carotid bruits with normal carotid upstroke Lungs: clear to ausculatation and percussion; no wheezing or rales Chest wall: Nontender to palpation Heart: RRR, s1 s2 normal 2/6 systolic murmur in the aortic area, no S3 gallop.  No rubs thrills or heaves. Abdomen: soft, nontender; no hepatosplenomehaly, BS+; abdominal aorta nontender and not dilated by palpation. Back: No CVA tenderness Pulses 2+ Extremities: no clubbing cyanosis or edema, Homan's sign negative  Neurologic: grossly nonfocal Psychological: Normal affect and mood  ECG (independently read by me): Normal sinus rhythm at 60 bpm.  First degree block with a PR interval at 252 ms.  No significant ST-T changes.  October 2015 ECG (independently read by me): Sinus bradycardia at 57 beats per minute.  First degree AV block with a PR interval of 236 ms   Prior September 2014 ECG: Sinus rhythm at 60 beats per minute. First grade AV block with PR interval of 244  ms. QTC interval 356 ms  LABS: BMP Latest Ref Rng 06/05/2014 05/24/2014 10/25/2013  Glucose 70 - 99 mg/dL - 148(H) 137(H)  BUN 6 - 23 mg/dL - 38(H) 36(H)  Creatinine 0.4 - 1.2 mg/dL - 1.7(H) 1.7(H)  Sodium 135 - 145 mEq/L - 135 135  Potassium 3.5 - 5.1 mEq/L - 4.2 4.1  Chloride 96 - 112 mEq/L - 103 103  CO2 19 - 32 mEq/L - 27 25  Calcium 8.7 - 10.3 mg/dL 10.9(H) 10.7(H) 10.4   Hepatic Function Latest Ref Rng 05/24/2014 01/23/2014 10/25/2013  Total Protein 6.0 - 8.3 g/dL 8.0 7.8 7.9  Albumin 3.5 - 5.2 g/dL 4.0 3.9 3.8  AST 0 - 37 U/L $Remo'25 23 26  'tCssH$ ALT 0 - 35 U/L $Remo'15 16 22  'DpvgW$ Alk Phosphatase 39 - 117 U/L 44 43 46  Total Bilirubin 0.2 - 1.2 mg/dL 0.6 0.5 0.5  Bilirubin, Direct 0.0 - 0.3 mg/dL 0.1 0.0 0.0   CBC Latest Ref Rng 05/10/2013 05/02/2013 09/15/2012  WBC 4.0 - 10.5 K/uL 9.5 9.9 8.5  Hemoglobin 12.0 - 15.0 g/dL 12.9 12.2 13.1  Hematocrit 36.0 - 46.0 % 37.6 36.0 38.9  Platelets 150 - 400 K/uL 206 209.0 214.0   Lab Results  Component Value Date   MCV 97.2 05/10/2013   MCV 100.2* 05/02/2013   MCV 99.1 09/15/2012   Lab Results  Component Value Date   TSH 1.95 09/15/2012   Lab Results  Component Value Date   HGBA1C 7.1* 09/24/2014   Lab Results  Component Value Date   CALCIUM 10.9* 06/05/2014   Lipid Panel     Component Value Date/Time   CHOL 146 02/28/2015 0928   TRIG 78.0 02/28/2015 0928   HDL 42.60 02/28/2015 0928   CHOLHDL 3 02/28/2015 0928   VLDL 15.6 02/28/2015 0928   LDLCALC 88 02/28/2015 0928    RADIOLOGY: No results found.    ASSESSMENT AND PLAN: Bonnie Chavez is a 81 year old African-American female with long-standing history of hypertension with grade 1 diastolic dysfunction and normal systolic function documented by her last echocardiogram in September 2011.  That time, she was found to have aortic valve sclerosis without stenosis and had mitral  annular calcification with trace MR and trace TR.  Her blood pressure today is stable on her current regimen consisting  of amlodipine 10 mg, furosemide 40 g daily, oral 10 mg in addition to her Bystolic.  However, she does have first degree AV block and I'm reducing her Bystolic dose to 5 mg twice a day.  Her cardiac murmur is slightly increased in since 5 years.  Has elapsed since her last echo Doppler study on scheduling her for a follow-up 2-D echo Doppler evaluation.  She will be seen Dr. Clista Bernhardt Dr. Suzette Battiest  for follow-up of her renal function and goiter.  I will see her in 6 months for cardiology reevaluation.   Time spent: 25 minutes  Troy Sine, MD, [facility]  08/15/2015 5:45 PM

## 2015-08-23 ENCOUNTER — Other Ambulatory Visit: Payer: Self-pay

## 2015-08-23 ENCOUNTER — Ambulatory Visit (HOSPITAL_COMMUNITY): Payer: Medicare Other | Attending: Cardiovascular Disease

## 2015-08-23 DIAGNOSIS — R011 Cardiac murmur, unspecified: Secondary | ICD-10-CM | POA: Diagnosis not present

## 2015-08-23 DIAGNOSIS — I10 Essential (primary) hypertension: Secondary | ICD-10-CM

## 2015-08-23 DIAGNOSIS — E785 Hyperlipidemia, unspecified: Secondary | ICD-10-CM | POA: Insufficient documentation

## 2015-08-23 DIAGNOSIS — I313 Pericardial effusion (noninflammatory): Secondary | ICD-10-CM | POA: Diagnosis not present

## 2015-08-23 DIAGNOSIS — E119 Type 2 diabetes mellitus without complications: Secondary | ICD-10-CM | POA: Diagnosis not present

## 2015-08-23 DIAGNOSIS — Z8249 Family history of ischemic heart disease and other diseases of the circulatory system: Secondary | ICD-10-CM | POA: Insufficient documentation

## 2015-08-23 DIAGNOSIS — I517 Cardiomegaly: Secondary | ICD-10-CM | POA: Insufficient documentation

## 2015-08-26 DIAGNOSIS — Z23 Encounter for immunization: Secondary | ICD-10-CM | POA: Diagnosis not present

## 2015-08-26 DIAGNOSIS — N183 Chronic kidney disease, stage 3 (moderate): Secondary | ICD-10-CM | POA: Diagnosis not present

## 2015-08-26 DIAGNOSIS — N189 Chronic kidney disease, unspecified: Secondary | ICD-10-CM | POA: Diagnosis not present

## 2015-08-26 DIAGNOSIS — E1129 Type 2 diabetes mellitus with other diabetic kidney complication: Secondary | ICD-10-CM | POA: Diagnosis not present

## 2015-08-26 DIAGNOSIS — D631 Anemia in chronic kidney disease: Secondary | ICD-10-CM | POA: Diagnosis not present

## 2015-08-26 DIAGNOSIS — N2581 Secondary hyperparathyroidism of renal origin: Secondary | ICD-10-CM | POA: Diagnosis not present

## 2015-09-02 ENCOUNTER — Other Ambulatory Visit: Payer: Self-pay | Admitting: Cardiovascular Disease

## 2015-09-02 ENCOUNTER — Encounter: Payer: Self-pay | Admitting: *Deleted

## 2015-09-06 ENCOUNTER — Other Ambulatory Visit: Payer: Self-pay | Admitting: Cardiovascular Disease

## 2015-09-06 NOTE — Telephone Encounter (Signed)
Rx(s) sent to pharmacy electronically.  

## 2015-09-09 DIAGNOSIS — Z471 Aftercare following joint replacement surgery: Secondary | ICD-10-CM | POA: Diagnosis not present

## 2015-09-09 DIAGNOSIS — Z96651 Presence of right artificial knee joint: Secondary | ICD-10-CM | POA: Diagnosis not present

## 2015-09-09 DIAGNOSIS — M5136 Other intervertebral disc degeneration, lumbar region: Secondary | ICD-10-CM | POA: Diagnosis not present

## 2015-09-09 DIAGNOSIS — N183 Chronic kidney disease, stage 3 (moderate): Secondary | ICD-10-CM | POA: Diagnosis not present

## 2015-09-09 DIAGNOSIS — M4316 Spondylolisthesis, lumbar region: Secondary | ICD-10-CM | POA: Diagnosis not present

## 2015-09-14 ENCOUNTER — Ambulatory Visit (INDEPENDENT_AMBULATORY_CARE_PROVIDER_SITE_OTHER): Payer: Medicare Other | Admitting: Family Medicine

## 2015-09-14 ENCOUNTER — Encounter: Payer: Self-pay | Admitting: Family Medicine

## 2015-09-14 VITALS — BP 150/90 | HR 73 | Temp 98.2°F | Resp 20 | Ht 64.0 in | Wt 194.5 lb

## 2015-09-14 DIAGNOSIS — J069 Acute upper respiratory infection, unspecified: Secondary | ICD-10-CM | POA: Diagnosis not present

## 2015-09-14 MED ORDER — HYDROCODONE-HOMATROPINE 5-1.5 MG/5ML PO SYRP: 5.0000 mL | ORAL_SOLUTION | Freq: Three times a day (TID) | ORAL | Status: DC | PRN

## 2015-09-14 NOTE — Assessment & Plan Note (Signed)
Advised supportive care. Hycodan given for cough.

## 2015-09-14 NOTE — Progress Notes (Signed)
   Subjective:  Patient ID: Bonnie Chavez, female    DOB: 23-Oct-1934  Age: 81 y.o. MRN: [id number]  CC: Cough, chest congestion, nasal congestion  HPI:  81 year old female presents to clinic today with the above complaints.  Patient reports that her symptoms started on Wednesday. She been having runny nose, nasal congestion/drainage, and has recently developed productive cough. She denies any associated fevers or chills. No known sick contacts. She's taken some over the counter cough syrup with little improvement in her symptoms. No known exacerbating factors.  Social Hx   Social History   Social History  . Marital Status: Divorced    Spouse Name: N/A  . Number of Children: 2  . Years of Education: N/A   Social History Main Topics  . Smoking status: Never Smoker   . Smokeless tobacco: None  . Alcohol Use: No  . Drug Use: No  . Sexual Activity: Not Asked   Other Topics Concern  . None   Social History Narrative   Review of Systems  Constitutional: Negative for fever and chills.  HENT: Positive for rhinorrhea.   Respiratory: Positive for cough.    Objective:  BP 150/90 mmHg  Pulse 73  Temp(Src) 98.2 F (36.8 C) (Oral)  Resp 20  Ht 5\' 4"  (1.626 m)  Wt 194 lb 8 oz (88.225 kg)  BMI 33.37 kg/m2  SpO2 99%  BP/Weight 09/14/2015 07/10/2992 05/23/6966  Systolic BP 893 810 175  Diastolic BP 90 62 62  Wt. (Lbs) 194.5 195.3 196.3  BMI 33.37 33.51 33.19   Physical Exam  Constitutional: She appears well-developed. No distress.  HENT:  Head: Normocephalic and atraumatic.  Mouth/Throat: Oropharynx is clear and moist.  Normal TMs bilaterally.  Neck: Neck supple.  Cardiovascular: Normal rate.   2/6 systolic murmur noted.  Pulmonary/Chest: Effort normal and breath sounds normal.  Lymphadenopathy:    She has no cervical adenopathy.  Neurological: She is alert.  Psychiatric: She has a normal mood and affect.  Vitals reviewed.  Assessment & Plan:   Problem List Items  Addressed This Visit    URI (upper respiratory infection) - Primary    Advised supportive care. Hycodan given for cough.        Meds ordered this encounter  Medications  . HYDROcodone-homatropine (HYCODAN) 5-1.5 MG/5ML syrup    Sig: Take 5 mLs by mouth every 8 (eight) hours as needed for cough.    Dispense:  120 mL    Refill:  0    Follow-up: PRN  Thersa Salt, DO

## 2015-09-14 NOTE — Patient Instructions (Signed)
Take the cough syrup as needed.  Get plenty of rest and fluids.  Upper Respiratory Infection, Adult Most upper respiratory infections (URIs) are a viral infection of the air passages leading to the lungs. A URI affects the nose, throat, and upper air passages. The most common type of URI is nasopharyngitis and is typically referred to as "the common cold." URIs run their course and usually go away on their own. Most of the time, a URI does not require medical attention, but sometimes a bacterial infection in the upper airways can follow a viral infection. This is called a secondary infection. Sinus and middle ear infections are common types of secondary upper respiratory infections. Bacterial pneumonia can also complicate a URI. A URI can worsen asthma and chronic obstructive pulmonary disease (COPD). Sometimes, these complications can require emergency medical care and may be life threatening.  CAUSES Almost all URIs are caused by viruses. A virus is a type of germ and can spread from one person to another.  RISKS FACTORS You may be at risk for a URI if:   You smoke.   You have chronic heart or lung disease.  You have a weakened defense (immune) system.   You are very young or very old.   You have nasal allergies or asthma.  You work in crowded or poorly ventilated areas.  You work in health care facilities or schools. SIGNS AND SYMPTOMS  Symptoms typically develop 2-3 days after you come in contact with a cold virus. Most viral URIs last 7-10 days. However, viral URIs from the influenza virus (flu virus) can last 14-18 days and are typically more severe. Symptoms may include:   Runny or stuffy (congested) nose.   Sneezing.   Cough.   Sore throat.   Headache.   Fatigue.   Fever.   Loss of appetite.   Pain in your forehead, behind your eyes, and over your cheekbones (sinus pain).  Muscle aches.  DIAGNOSIS  Your health care provider may diagnose a URI  by:  Physical exam.  Tests to check that your symptoms are not due to another condition such as:  Strep throat.  Sinusitis.  Pneumonia.  Asthma. TREATMENT  A URI goes away on its own with time. It cannot be cured with medicines, but medicines may be prescribed or recommended to relieve symptoms. Medicines may help:  Reduce your fever.  Reduce your cough.  Relieve nasal congestion. HOME CARE INSTRUCTIONS   Take medicines only as directed by your health care provider.   Gargle warm saltwater or take cough drops to comfort your throat as directed by your health care provider.  Use a warm mist humidifier or inhale steam from a shower to increase air moisture. This may make it easier to breathe.  Drink enough fluid to keep your urine clear or pale yellow.   Eat soups and other clear broths and maintain good nutrition.   Rest as needed.   Return to work when your temperature has returned to normal or as your health care provider advises. You may need to stay home longer to avoid infecting others. You can also use a face mask and careful hand washing to prevent spread of the virus.  Increase the usage of your inhaler if you have asthma.   Do not use any tobacco products, including cigarettes, chewing tobacco, or electronic cigarettes. If you need help quitting, ask your health care provider. PREVENTION  The best way to protect yourself from getting a cold is to  practice good hygiene.   Avoid oral or hand contact with people with cold symptoms.   Wash your hands often if contact occurs.  There is no clear evidence that vitamin C, vitamin E, echinacea, or exercise reduces the chance of developing a cold. However, it is always recommended to get plenty of rest, exercise, and practice good nutrition.  SEEK MEDICAL CARE IF:   You are getting worse rather than better.   Your symptoms are not controlled by medicine.   You have chills.  You have worsening shortness of  breath.  You have brown or red mucus.  You have yellow or brown nasal discharge.  You have pain in your face, especially when you bend forward.  You have a fever.  You have swollen neck glands.  You have pain while swallowing.  You have white areas in the back of your throat. SEEK IMMEDIATE MEDICAL CARE IF:   You have severe or persistent:  Headache.  Ear pain.  Sinus pain.  Chest pain.  You have chronic lung disease and any of the following:  Wheezing.  Prolonged cough.  Coughing up blood.  A change in your usual mucus.  You have a stiff neck.  You have changes in your:  Vision.  Hearing.  Thinking.  Mood. MAKE SURE YOU:   Understand these instructions.  Will watch your condition.  Will get help right away if you are not doing well or get worse.   This information is not intended to replace advice given to you by your health care provider. Make sure you discuss any questions you have with your health care provider.   Document Released: 05/05/2001 Document Revised: 03/26/2015 Document Reviewed: 02/14/2014 Elsevier Interactive Patient Education [employer].

## 2015-09-14 NOTE — Progress Notes (Signed)
Pre visit review using our clinic review tool, if applicable. No additional management support is needed unless otherwise documented below in the visit note. 

## 2015-09-16 ENCOUNTER — Telehealth: Payer: Self-pay | Admitting: Family Medicine

## 2015-09-16 NOTE — Telephone Encounter (Signed)
[facility] Night - Client TELEPHONE [facility] Patient Name: Bonnie Chavez Gender: Female DOB: 12-22-1933 Age: 81 Y 31 M 20 D Return Phone Number: [phone] (Primary) Address: City/State/Zip: [place] Client [facility] Night - Client Client Site [facility] - Night Physician Colin Benton Contact Type Call Call Type Triage / Clinical Relationship To Patient Self Return Phone Number [phone] (Primary) Chief Complaint Cough Initial Comment Caller says her throat is sore, she is concerned she is getting bronchitis PreDisposition Did not know what to do Nurse Assessment Nurse: Leilani Merl, RN, Heather Date/Time (Eastern Time): 09/14/2015 9:32:46 AM Confirm and document reason for call. If symptomatic, describe symptoms. ---Caller says her throat is sore and she coughed all night, she is concerned she is getting bronchitis Has the patient traveled out of the country within the last 30 days? ---Not Applicable Does the patient have any new or worsening symptoms? ---Yes Will a triage be completed? ---Yes Related visit to physician within the last 2 weeks? ---No Does the PT have any chronic conditions? (i.e. diabetes, asthma, etc.) ---Yes List chronic conditions. ---DM Guidelines Guideline Title Affirmed Question Affirmed Notes Nurse Date/Time (Eastern Time) Cough - Acute Productive Wheezing is present Facilities manager, RN, Nira Conn 09/14/2015 9:33:47 AM Disp. Time Eilene Ghazi Time) Disposition Final User 09/14/2015 9:45:36 AM Call Completed Standifer, RN, Nira Conn 09/14/2015 9:44:50 AM See Physician within 4 Hours (or PCP triage) Yes Standifer, RN, Soyla Murphy Understands: Yes Disagree/Comply: Comply Care Advice Given Per Guideline PLEASE NOTE: All timestamps contained within this report are represented as [place] Standard Time. CONFIDENTIALTY NOTICE: This fax transmission is intended only for  the addressee. It contains information that is legally privileged, confidential or otherwise protected from use or disclosure. If you are not the intended recipient, you are strictly prohibited from reviewing, disclosing, copying using or disseminating any of this information or taking any action in reliance on or regarding this information. If you have received this fax in error, please notify us immediately by telephone so that we can arrange for its return to [place]. Phone: [phone], Toll-Free: [phone], Fax: [phone] Page: 2 of 2 Call Id: [id number] Care Advice Given Per Guideline SEE PHYSICIAN WITHIN 4 HOURS (or PCP triage): * IF OFFICE WILL BE OPEN: You need to be seen within the next 3 or 4 hours. Call your doctor's office now or as soon as it opens. CARE ADVICE given per Cough - Acute Productive (Adult) guideline. CALL BACK IF: * You become worse. After Care Instructions Given Call Event Type User Date / Time Description Referrals [facility]

## 2015-09-25 DIAGNOSIS — M5136 Other intervertebral disc degeneration, lumbar region: Secondary | ICD-10-CM | POA: Diagnosis not present

## 2015-09-30 DIAGNOSIS — M5136 Other intervertebral disc degeneration, lumbar region: Secondary | ICD-10-CM | POA: Diagnosis not present

## 2015-10-01 ENCOUNTER — Other Ambulatory Visit: Payer: Self-pay | Admitting: Cardiovascular Disease

## 2015-10-01 ENCOUNTER — Other Ambulatory Visit: Payer: Self-pay | Admitting: Family

## 2015-10-04 DIAGNOSIS — M5136 Other intervertebral disc degeneration, lumbar region: Secondary | ICD-10-CM | POA: Diagnosis not present

## 2015-10-09 DIAGNOSIS — M5136 Other intervertebral disc degeneration, lumbar region: Secondary | ICD-10-CM | POA: Diagnosis not present

## 2015-10-11 DIAGNOSIS — M5136 Other intervertebral disc degeneration, lumbar region: Secondary | ICD-10-CM | POA: Diagnosis not present

## 2015-10-14 DIAGNOSIS — M5136 Other intervertebral disc degeneration, lumbar region: Secondary | ICD-10-CM | POA: Diagnosis not present

## 2015-10-21 DIAGNOSIS — M5136 Other intervertebral disc degeneration, lumbar region: Secondary | ICD-10-CM | POA: Diagnosis not present

## 2015-10-23 DIAGNOSIS — M5136 Other intervertebral disc degeneration, lumbar region: Secondary | ICD-10-CM | POA: Diagnosis not present

## 2015-10-24 DIAGNOSIS — M4316 Spondylolisthesis, lumbar region: Secondary | ICD-10-CM | POA: Diagnosis not present

## 2015-10-24 DIAGNOSIS — M5136 Other intervertebral disc degeneration, lumbar region: Secondary | ICD-10-CM | POA: Diagnosis not present

## 2015-10-28 ENCOUNTER — Encounter: Payer: Self-pay | Admitting: Family Medicine

## 2015-10-28 ENCOUNTER — Ambulatory Visit (INDEPENDENT_AMBULATORY_CARE_PROVIDER_SITE_OTHER): Payer: Medicare Other | Admitting: Family Medicine

## 2015-10-28 ENCOUNTER — Encounter: Payer: Self-pay | Admitting: *Deleted

## 2015-10-28 VITALS — BP 112/58 | HR 65 | Temp 97.5°F | Ht 64.0 in | Wt 192.6 lb

## 2015-10-28 DIAGNOSIS — I10 Essential (primary) hypertension: Secondary | ICD-10-CM | POA: Diagnosis not present

## 2015-10-28 DIAGNOSIS — I358 Other nonrheumatic aortic valve disorders: Secondary | ICD-10-CM

## 2015-10-28 DIAGNOSIS — E1122 Type 2 diabetes mellitus with diabetic chronic kidney disease: Secondary | ICD-10-CM

## 2015-10-28 DIAGNOSIS — E785 Hyperlipidemia, unspecified: Secondary | ICD-10-CM

## 2015-10-28 DIAGNOSIS — I5032 Chronic diastolic (congestive) heart failure: Secondary | ICD-10-CM | POA: Insufficient documentation

## 2015-10-28 LAB — HEMOGLOBIN A1C: Hgb A1c MFr Bld: 7.3 % — ABNORMAL HIGH (ref 4.6–6.5)

## 2015-10-28 NOTE — Progress Notes (Signed)
HPI:  Bonnie Chavez is here to establish care.   Has the following chronic problems that require follow up and concerns today:  CdCHF/HLD/aortic valve sclerosis: -sees cardiologist (Dr; Claiborne Billings) -meds:amlodipine 10, crestor 40, lasix 20, lisinopril 10, nebivolol 5, asa -exercises 3x per week at [facility] and denies CP, SOB, DOE  DM, Hyperparathyroidism: -sees endocriologist (Dr. Chalmers Cater) -saw surgeon for elevated Ca and Vit d replacement started -labs done 01/2015 with Dr. Chalmers Cater, DM well controlled, Ca normal, Vit D low -meds: tradjenta 5  CKD/vit D def: -sees nephrology ([name]) -reports seen about 1 month ago and labs done -reports told to stop vit D  HRT for Hot Flashes, Uterine Polyps: -sees gynecologist ([name])  DDD/Lumbar radiculopathy/CTS: -seeing [place] [facility]  ROS negative for unless reported above: fevers, unintentional weight loss, hearing or vision loss, chest pain, palpitations, struggling to breath, hemoptysis, melena, hematochezia, hematuria, falls, loc, si, thoughts of self harm  Past Medical History  Diagnosis Date  . Hypertension   . Diabetes mellitus     type 2, sees Dr. Chalmers Cater  . Chronic diastolic CHF (congestive heart failure) ([place])   . Hyperlipidemia   . Osteoarthritis     DDD, sees [facility]  . PMB (postmenopausal bleeding) 02/28/2009    Resolved  . Vaginal atrophy   . Vitamin D deficiency   . Mitral valve prolapse   . Migraine     no longer has migraines (80's)  . Renal insufficiency     [name]   . Aortic valve sclerosis   . Goiter     Dr. Chalmers Cater  . Heart murmur   . History of nuclear stress test 06/2010    dipyridamole; normal pattern of perfusion; ekg negative for ischemia; low risk scan   . Chicken pox   . Hot flashes   . Carpal tunnel syndrome     Past Surgical History  Procedure Laterality Date  . Cesarean section  1972 &1976  . Tubal ligation  1976  . Replacement total knee  2000    left  . Dilation and curettage  of uterus  1975  . Cholecystectomy  1991  . Hemorrhoid surgery  1997  . Knee surgery  2000 2011    right knee replacement   . Shoulder surgery  2004 2010   . Dilatation & currettage/hysteroscopy with resectocope N/A 05/15/2013    Procedure: [place];  Surgeon: Eldred Manges, MD;  Location: [facility];  Service: Gynecology;  Laterality: N/A;  . Transthoracic echocardiogram  07/2010    EF=>55%; LA mildly dilated; trace MR/TR;    Family History  Problem Relation Age of Onset  . Hypertension Mother   . Diabetes Mother   . Hyperlipidemia Mother   . Heart attack Mother   . Heart disease Father   . Heart attack Father   . Stroke Brother   . Heart disease Brother   . Diabetes Brother   . Uterine cancer Maternal Grandmother     Social History   Social History  . Marital Status: Divorced    Spouse Name: N/A  . Number of Children: 2  . Years of Education: N/A   Social History Main Topics  . Smoking status: Never Smoker   . Smokeless tobacco: None  . Alcohol Use: No  . Drug Use: No  . Sexual Activity: Not Asked   Other Topics Concern  . None   Social History Narrative   Work or School: none  Home Situation: lives alone, feels safe, son lives nearby      Spiritual Beliefs: Christian      Lifestyle: Y 3x per week, healthy diet           Current outpatient prescriptions:  .  amLODipine (NORVASC) 10 MG tablet, TAKE 1 TABLET BY MOUTH AT BEDTIME., Disp: 30 tablet, Rfl: 5 .  aspirin (ECOTRIN LOW STRENGTH) 81 MG EC tablet, Take 81 mg by mouth daily. Swallow whole., Disp: , Rfl:  .  Blood Glucose Monitoring Suppl (ONE TOUCH ULTRA MINI) W/DEVICE KIT, Use as directed, Disp: 1 each, Rfl: 0 .  CRESTOR 40 MG tablet, TAKE 1 TABLET BY MOUTH AT BEDTIME, Disp: 30 tablet, Rfl: 5 .  estradiol (VIVELLE-DOT) 0.05 MG/24HR patch, Place 0.05 patches onto the skin 2 (two) times a week., Disp: , Rfl: 12 .  furosemide (LASIX) 20 MG tablet, Take 40  mg by mouth daily., Disp: , Rfl:  .  gabapentin (NEURONTIN) 100 MG capsule, TAKE 1 CAPSULE (100 MG TOTAL) BY MOUTH 3 (THREE) TIMES DAILY., Disp: 30 capsule, Rfl: 0 .  Lancets Misc. KIT, 1 Device by Does not apply route 2 (two) times daily., Disp: 100 each, Rfl: 3 .  lisinopril (PRINIVIL,ZESTRIL) 10 MG tablet, TAKE 1 TABLET BY MOUTH EVERY DAY, Disp: 30 tablet, Rfl: 5 .  nebivolol (BYSTOLIC) 5 MG tablet, Take 1 tablet (5 mg total) by mouth 2 (two) times daily., Disp: 60 tablet, Rfl: 11 .  ONE TOUCH ULTRA TEST test strip, USE TWICE A DAY AND AS NEEDED, Disp: 100 each, Rfl: 3 .  progesterone (PROMETRIUM) 100 MG capsule, Take 100 mg by mouth daily., Disp: , Rfl:  .  pyridOXINE (VITAMIN B-6) 100 MG tablet, Take 200 mg by mouth daily., Disp: , Rfl:  .  TRADJENTA 5 MG TABS tablet, TAKE 1 TABLET BY MOUTH EVERY DAY, Disp: 90 tablet, Rfl: 1  EXAM:  Filed Vitals:   10/28/15 1101  BP: 112/58  Pulse: 65  Temp: 97.5 F (36.4 C)    Body mass index is 33.04 kg/(m^2).  GENERAL: vitals reviewed and listed above, alert, oriented, appears well hydrated and in no acute distress  HEENT: atraumatic, conjunttiva clear, no obvious abnormalities on inspection of external nose and ears  NECK: no obvious masses on inspection  LUNGS: clear to auscultation bilaterally, no wheezes, rales or rhonchi, good air movement  CV: HRRR, no peripheral edema  MS: moves all extremities without noticeable abnormality  PSYCH: pleasant and cooperative, no obvious depression or anxiety  ASSESSMENT AND PLAN:  Discussed the following assessment and plan:  Type 2 diabetes mellitus with chronic kidney disease, without long-term current use of insulin, unspecified CKD stage (HCC) - Plan: Hemoglobin A1c  Hyperlipidemia  Essential hypertension  Aortic valve sclerosis  Chronic diastolic congestive heart failure ([place]) -We reviewed the PMH, PSH, FH, SH, Meds and Allergies. -We provided refills for any medications we will  prescribe as needed. -We addressed current concerns per orders and patient instructions. -We have asked for records for pertinent exams, studies, vaccines and notes from previous providers. -We have advised patient to follow up per instructions below. -lifestyle recs -foot exam -NOT fasting -Hgba1c  -Patient advised to return or notify a doctor immediately if symptoms worsen or persist or new concerns arise.  Patient Instructions  BEFORE YOU LEAVE: -labs for diabetes check - will forward to your endocrinologist -follow up in 4 months  We recommend the following healthy lifestyle measures: - eat a healthy whole foods diet  consisting of regular small meals composed of vegetables, fruits, beans, nuts, seeds, healthy meats such as white chicken and fish and whole grains.  - avoid sweets, white starchy foods, fried foods, fast food, processed foods, sodas, red meet and other fattening foods.  - get a least 150-300 minutes of aerobic exercise per week.   -We have ordered labs or studies at this visit. It can take up to 1-2 weeks for results and processing. We will contact you with instructions IF your results are abnormal. Normal results will be released to your [facility]. If you have not heard from [place] or can not find your results in [facility] in 2 weeks please contact our office.            Colin Benton R.

## 2015-10-28 NOTE — Progress Notes (Signed)
Pre visit review using our clinic review tool, if applicable. No additional management support is needed unless otherwise documented below in the visit note. 

## 2015-10-28 NOTE — Patient Instructions (Signed)
BEFORE YOU LEAVE: -labs for diabetes check - will forward to your endocrinologist -follow up in 4 months  We recommend the following healthy lifestyle measures: - eat a healthy whole foods diet consisting of regular small meals composed of vegetables, fruits, beans, nuts, seeds, healthy meats such as white chicken and fish and whole grains.  - avoid sweets, white starchy foods, fried foods, fast food, processed foods, sodas, red meet and other fattening foods.  - get a least 150-300 minutes of aerobic exercise per week.   -We have ordered labs or studies at this visit. It can take up to 1-2 weeks for results and processing. We will contact you with instructions IF your results are abnormal. Normal results will be released to your [facility]. If you have not heard from [place] or can not find your results in [facility] in 2 weeks please contact our office.

## 2015-11-04 ENCOUNTER — Other Ambulatory Visit: Payer: Self-pay | Admitting: Family

## 2015-12-03 ENCOUNTER — Other Ambulatory Visit: Payer: Self-pay | Admitting: Family Medicine

## 2015-12-03 NOTE — Telephone Encounter (Signed)
Dr Kim-Patient is scheduled for a follow up on 4/6 and there is not an open spot after her visit.

## 2015-12-03 NOTE — Telephone Encounter (Signed)
I don't think she ever had new pt visit but im listed as PCP. Can you change follow up appt to 30 minute new pt visit and ok to refill until that appointment. Thanks.

## 2015-12-05 DIAGNOSIS — N840 Polyp of corpus uteri: Secondary | ICD-10-CM | POA: Diagnosis not present

## 2015-12-05 DIAGNOSIS — Z9889 Other specified postprocedural states: Secondary | ICD-10-CM | POA: Diagnosis not present

## 2015-12-05 DIAGNOSIS — N950 Postmenopausal bleeding: Secondary | ICD-10-CM | POA: Diagnosis not present

## 2015-12-05 LAB — HM PAP SMEAR

## 2015-12-10 DIAGNOSIS — N950 Postmenopausal bleeding: Secondary | ICD-10-CM | POA: Diagnosis not present

## 2015-12-10 DIAGNOSIS — N951 Menopausal and female climacteric states: Secondary | ICD-10-CM | POA: Diagnosis not present

## 2016-01-23 DIAGNOSIS — M1990 Unspecified osteoarthritis, unspecified site: Secondary | ICD-10-CM | POA: Diagnosis not present

## 2016-01-23 DIAGNOSIS — E785 Hyperlipidemia, unspecified: Secondary | ICD-10-CM | POA: Diagnosis not present

## 2016-01-23 DIAGNOSIS — N183 Chronic kidney disease, stage 3 (moderate): Secondary | ICD-10-CM | POA: Diagnosis not present

## 2016-01-23 DIAGNOSIS — N2581 Secondary hyperparathyroidism of renal origin: Secondary | ICD-10-CM | POA: Diagnosis not present

## 2016-01-23 DIAGNOSIS — E039 Hypothyroidism, unspecified: Secondary | ICD-10-CM | POA: Diagnosis not present

## 2016-01-23 DIAGNOSIS — Z9889 Other specified postprocedural states: Secondary | ICD-10-CM | POA: Diagnosis not present

## 2016-01-23 DIAGNOSIS — I129 Hypertensive chronic kidney disease with stage 1 through stage 4 chronic kidney disease, or unspecified chronic kidney disease: Secondary | ICD-10-CM | POA: Diagnosis not present

## 2016-01-23 DIAGNOSIS — E1129 Type 2 diabetes mellitus with other diabetic kidney complication: Secondary | ICD-10-CM | POA: Diagnosis not present

## 2016-01-23 DIAGNOSIS — D631 Anemia in chronic kidney disease: Secondary | ICD-10-CM | POA: Diagnosis not present

## 2016-01-28 ENCOUNTER — Other Ambulatory Visit: Payer: Self-pay | Admitting: Family Medicine

## 2016-01-29 DIAGNOSIS — N951 Menopausal and female climacteric states: Secondary | ICD-10-CM | POA: Diagnosis not present

## 2016-01-30 DIAGNOSIS — E213 Hyperparathyroidism, unspecified: Secondary | ICD-10-CM | POA: Diagnosis not present

## 2016-01-30 DIAGNOSIS — E1129 Type 2 diabetes mellitus with other diabetic kidney complication: Secondary | ICD-10-CM | POA: Diagnosis not present

## 2016-01-30 DIAGNOSIS — E049 Nontoxic goiter, unspecified: Secondary | ICD-10-CM | POA: Diagnosis not present

## 2016-01-30 LAB — BASIC METABOLIC PANEL
BUN: 49 mg/dL — AB (ref 4–21)
CREATININE: 1.8 mg/dL — AB (ref <=1.1)
Glucose: 161 mg/dL
Potassium: 4.3 mmol/L (ref 3.4–5.3)
SODIUM: 135 mmol/L — AB (ref 137–147)

## 2016-01-30 LAB — TSH: TSH: 2 u[IU]/mL (ref <=5.90)

## 2016-01-30 LAB — HEMOGLOBIN A1C: Hemoglobin A1C: 7.4

## 2016-01-31 DIAGNOSIS — E119 Type 2 diabetes mellitus without complications: Secondary | ICD-10-CM | POA: Diagnosis not present

## 2016-01-31 LAB — HM DIABETES EYE EXAM

## 2016-02-06 DIAGNOSIS — E213 Hyperparathyroidism, unspecified: Secondary | ICD-10-CM | POA: Diagnosis not present

## 2016-02-06 DIAGNOSIS — I10 Essential (primary) hypertension: Secondary | ICD-10-CM | POA: Diagnosis not present

## 2016-02-06 DIAGNOSIS — E1129 Type 2 diabetes mellitus with other diabetic kidney complication: Secondary | ICD-10-CM | POA: Diagnosis not present

## 2016-02-06 DIAGNOSIS — E119 Type 2 diabetes mellitus without complications: Secondary | ICD-10-CM | POA: Diagnosis not present

## 2016-02-11 ENCOUNTER — Encounter: Payer: Self-pay | Admitting: Family Medicine

## 2016-02-27 ENCOUNTER — Encounter: Payer: Self-pay | Admitting: Family Medicine

## 2016-02-27 ENCOUNTER — Ambulatory Visit (INDEPENDENT_AMBULATORY_CARE_PROVIDER_SITE_OTHER): Payer: Medicare Other | Admitting: Family Medicine

## 2016-02-27 VITALS — BP 136/70 | HR 63 | Temp 98.1°F | Ht 64.0 in | Wt 192.5 lb

## 2016-02-27 DIAGNOSIS — I358 Other nonrheumatic aortic valve disorders: Secondary | ICD-10-CM | POA: Diagnosis not present

## 2016-02-27 DIAGNOSIS — N183 Chronic kidney disease, stage 3 unspecified: Secondary | ICD-10-CM

## 2016-02-27 DIAGNOSIS — E669 Obesity, unspecified: Secondary | ICD-10-CM

## 2016-02-27 DIAGNOSIS — M545 Low back pain, unspecified: Secondary | ICD-10-CM

## 2016-02-27 DIAGNOSIS — E213 Hyperparathyroidism, unspecified: Secondary | ICD-10-CM

## 2016-02-27 DIAGNOSIS — E1122 Type 2 diabetes mellitus with diabetic chronic kidney disease: Secondary | ICD-10-CM | POA: Diagnosis not present

## 2016-02-27 DIAGNOSIS — I5032 Chronic diastolic (congestive) heart failure: Secondary | ICD-10-CM | POA: Diagnosis not present

## 2016-02-27 DIAGNOSIS — G8929 Other chronic pain: Secondary | ICD-10-CM

## 2016-02-27 DIAGNOSIS — N951 Menopausal and female climacteric states: Secondary | ICD-10-CM

## 2016-02-27 HISTORY — DX: Hyperparathyroidism, unspecified: E21.3

## 2016-02-27 HISTORY — DX: Low back pain, unspecified: M54.50

## 2016-02-27 HISTORY — DX: Other chronic pain: G89.29

## 2016-02-27 HISTORY — DX: Obesity, unspecified: E66.9

## 2016-02-27 NOTE — Patient Instructions (Signed)
Before you leave: -Bonnie Chavez, please copy her labs and return her copies to her and please update hemoglobin A1c and eye exam and health maintenance -Please schedule your Medicare wellness exam in 4 months  We recommend the following healthy lifestyle measures: - eat a healthy whole foods diet consisting of regular small meals composed of vegetables, fruits, beans, nuts, seeds, healthy meats such as white chicken and fish and whole grains.  - avoid sweets, white starchy foods, fried foods, fast food, processed foods, sodas, red meet and other fattening foods.  - get a least 150-300 minutes of aerobic exercise per week.

## 2016-02-27 NOTE — Progress Notes (Signed)
HPI:  Follow up:  CdCHF/HLD/aortic valve sclerosis/Obesity: -sees cardiologist (Dr. Claiborne Billings) - reports has appt later this month with cardiology -meds:amlodipine 10, crestor 40, lasix 20, lisinopril 10, nebivolol 5, asa -exercises 3x per week at [employer] in water aerobics and denies CP, SOB, DOE  DM, Hyperparathyroidism: -sees endocriologist (Dr. Chalmers Cater) -brings labs from 01/30/16 with Dr. Suzette Battiest, Hgba1c 7.4, Cr 1.8, Ca ok -meds: tradjenta 5; recently started on Humulin and is currently titrating -reports had eye exam last month -Denies low blood sugars, vision changes or wounds  CKD/vit D def: -sees nephrology ([name]) -Also had labs with nephrologist in early March; lab report reviewed and scanned into chart -reports told to stop vit D  HRT for Hot Flashes, Uterine Polyps: -sees gynecologist ([name])  DDD/Lumbar radiculopathy/CTS: -seeing [facility]  ROS negative for unless reported above: fevers, unintentional weight loss, hearing or vision loss, chest pain, palpitations, struggling to breath, hemoptysis, melena, hematochezia, hematuria, falls, loc, si, thoughts of self harm ROS: See pertinent positives and negatives per HPI.  Past Medical History  Diagnosis Date  . Hypertension   . Diabetes mellitus     type 2, sees Dr. Chalmers Cater  . Chronic diastolic CHF (congestive heart failure) ([place])   . Hyperlipidemia   . Osteoarthritis     DDD, sees [facility]  . PMB (postmenopausal bleeding) 02/28/2009    Resolved  . Vaginal atrophy   . Vitamin D deficiency   . Mitral valve prolapse   . Migraine     no longer has migraines (80's)  . Renal insufficiency     [name]   . Aortic valve sclerosis   . Goiter     Dr. Chalmers Cater  . Heart murmur   . History of nuclear stress test 06/2010    dipyridamole; normal pattern of perfusion; ekg negative for ischemia; low risk scan   . Chicken pox   . Hot flashes   . Carpal tunnel syndrome   . Obesity 02/27/2016  . Chronic low back pain  02/27/2016    -DDD -seeing [facility]   . Hyperparathyroidism ([place]) 02/27/2016    -sees endocrinologist, Dr. Chalmers Cater, has seen surgeon as well     Past Surgical History  Procedure Laterality Date  . Cesarean section  1972 &1976  . Tubal ligation  1976  . Replacement total knee  2000    left  . Dilation and curettage of uterus  1975  . Cholecystectomy  1991  . Hemorrhoid surgery  1997  . Knee surgery  2000 2011    right knee replacement   . Shoulder surgery  2004 2010   . Dilatation & currettage/hysteroscopy with resectocope N/A 05/15/2013    Procedure: [place];  Surgeon: Eldred Manges, MD;  Location: [facility];  Service: Gynecology;  Laterality: N/A;  . Transthoracic echocardiogram  07/2010    EF=>55%; LA mildly dilated; trace MR/TR;    Family History  Problem Relation Age of Onset  . Hypertension Mother   . Diabetes Mother   . Hyperlipidemia Mother   . Heart attack Mother   . Heart disease Father   . Heart attack Father   . Stroke Brother   . Heart disease Brother   . Diabetes Brother   . Uterine cancer Maternal Grandmother     Social History   Social History  . Marital Status: Divorced    Spouse Name: N/A  . Number of Children: 2  . Years of Education: N/A  Social History Main Topics  . Smoking status: Never Smoker   . Smokeless tobacco: None  . Alcohol Use: No  . Drug Use: No  . Sexual Activity: Not Asked   Other Topics Concern  . None   Social History Narrative   Work or School: none      Home Situation: lives alone, feels safe, son lives nearby      Spiritual Beliefs: Christian      Lifestyle: Y 3x per week, healthy diet           Current outpatient prescriptions:  .  amLODipine (NORVASC) 10 MG tablet, TAKE 1 TABLET BY MOUTH AT BEDTIME., Disp: 30 tablet, Rfl: 5 .  aspirin (ECOTRIN LOW STRENGTH) 81 MG EC tablet, Take 81 mg by mouth daily. Swallow whole., Disp: , Rfl:  .  Blood Glucose  Monitoring Suppl (ONE TOUCH ULTRA MINI) W/DEVICE KIT, Use as directed, Disp: 1 each, Rfl: 0 .  CRESTOR 40 MG tablet, TAKE 1 TABLET BY MOUTH AT BEDTIME, Disp: 30 tablet, Rfl: 5 .  estradiol (VIVELLE-DOT) 0.05 MG/24HR patch, Place 0.05 patches onto the skin 2 (two) times a week., Disp: , Rfl: 12 .  furosemide (LASIX) 20 MG tablet, Take 40 mg by mouth daily., Disp: , Rfl:  .  gabapentin (NEURONTIN) 100 MG capsule, TAKE ONE CAPSULE BY MOUTH 3 TIMES A DAY AS NEEDED, Disp: 30 capsule, Rfl: 2 .  HUMULIN N KWIKPEN 100 UNIT/ML Kiwkpen, 12 Units at bedtime., Disp: , Rfl: 6 .  Lancets Misc. KIT, 1 Device by Does not apply route 2 (two) times daily., Disp: 100 each, Rfl: 3 .  lisinopril (PRINIVIL,ZESTRIL) 10 MG tablet, TAKE 1 TABLET BY MOUTH EVERY DAY, Disp: 30 tablet, Rfl: 5 .  nebivolol (BYSTOLIC) 5 MG tablet, Take 1 tablet (5 mg total) by mouth 2 (two) times daily., Disp: 60 tablet, Rfl: 11 .  ONE TOUCH ULTRA TEST test strip, USE TWICE A DAY AND AS NEEDED, Disp: 100 each, Rfl: 3 .  progesterone (PROMETRIUM) 200 MG capsule, TAKE ONE CAPSULE BY MOUTH DAILY AT DINNER, Disp: , Rfl: 12 .  pyridOXINE (VITAMIN B-6) 100 MG tablet, Take 200 mg by mouth daily., Disp: , Rfl:  .  TRADJENTA 5 MG TABS tablet, TAKE 1 TABLET BY MOUTH EVERY DAY, Disp: 90 tablet, Rfl: 1  EXAM:  Filed Vitals:   02/27/16 0808  BP: 136/70  Pulse: 63  Temp: 98.1 F (36.7 C)    Body mass index is 33.03 kg/(m^2).  GENERAL: vitals reviewed and listed above, alert, oriented, appears well hydrated and in no acute distress  HEENT: atraumatic, conjunttiva clear, no obvious abnormalities on inspection of external nose and ears  NECK: no obvious masses on inspection  LUNGS: clear to auscultation bilaterally, no wheezes, rales or rhonchi, good air movement  CV: HRRR, SEM, no peripheral edema  MS: moves all extremities without noticeable abnormality  PSYCH: pleasant and cooperative, no obvious depression or anxiety  ASSESSMENT AND  PLAN:  Discussed the following assessment and plan:  Obesity  Aortic valve sclerosis  Chronic diastolic congestive heart failure (HCC)  Type 2 diabetes mellitus with chronic kidney disease, without long-term current use of insulin, unspecified CKD stage (HCC)  Hyperparathyroidism (HCC)  Chronic kidney disease, stage 3 (moderate)  Perimenopausal vasomotor symptoms  Chronic low back pain  -Reviewed and updated medications -Reviewed and counseled on goal fasting/postprandial blood sugars -Encouraged and counseled on healthy lifestyle including a low carb diet and regular aerobic exercise -Reviewed and copied to  scan recent labs done with endocrinologist and nephrologist -Advised to schedule Medicare wellness visit in 4 months -Patient advised to return or notify a doctor immediately if symptoms worsen or persist or new concerns arise.  Patient Instructions  Before you leave: -Wendie Simmer, please copy her labs and return her copies to her and please update hemoglobin A1c and eye exam and health maintenance -Please schedule your Medicare wellness exam in 4 months  We recommend the following healthy lifestyle measures: - eat a healthy whole foods diet consisting of regular small meals composed of vegetables, fruits, beans, nuts, seeds, healthy meats such as white chicken and fish and whole grains.  - avoid sweets, white starchy foods, fried foods, fast food, processed foods, sodas, red meet and other fattening foods.  - get a least 150-300 minutes of aerobic exercise per week.       Colin Benton R.

## 2016-02-27 NOTE — Progress Notes (Signed)
Pre visit review using our clinic review tool, if applicable. No additional management support is needed unless otherwise documented below in the visit note. 

## 2016-03-09 ENCOUNTER — Ambulatory Visit (INDEPENDENT_AMBULATORY_CARE_PROVIDER_SITE_OTHER): Payer: Medicare Other | Admitting: Cardiovascular Disease

## 2016-03-09 ENCOUNTER — Encounter: Payer: Self-pay | Admitting: Cardiovascular Disease

## 2016-03-09 VITALS — BP 118/55 | HR 61 | Ht 65.0 in | Wt 196.6 lb

## 2016-03-09 DIAGNOSIS — I10 Essential (primary) hypertension: Secondary | ICD-10-CM

## 2016-03-09 DIAGNOSIS — N183 Chronic kidney disease, stage 3 unspecified: Secondary | ICD-10-CM

## 2016-03-09 DIAGNOSIS — I350 Nonrheumatic aortic (valve) stenosis: Secondary | ICD-10-CM | POA: Diagnosis not present

## 2016-03-09 DIAGNOSIS — E785 Hyperlipidemia, unspecified: Secondary | ICD-10-CM | POA: Diagnosis not present

## 2016-03-09 NOTE — Patient Instructions (Signed)
Your physician wants you to follow-up in: 1 year or sooner if needed. You will receive a reminder letter in the mail two months in advance. If you don't receive a letter, please call our office to schedule the follow-up appointment.   If you need a refill on your cardiac medications before your next appointment, please call your pharmacy.   

## 2016-03-10 DIAGNOSIS — E049 Nontoxic goiter, unspecified: Secondary | ICD-10-CM | POA: Diagnosis not present

## 2016-03-10 DIAGNOSIS — I350 Nonrheumatic aortic (valve) stenosis: Secondary | ICD-10-CM | POA: Insufficient documentation

## 2016-03-10 DIAGNOSIS — E1129 Type 2 diabetes mellitus with other diabetic kidney complication: Secondary | ICD-10-CM | POA: Diagnosis not present

## 2016-03-10 DIAGNOSIS — I10 Essential (primary) hypertension: Secondary | ICD-10-CM | POA: Diagnosis not present

## 2016-03-10 DIAGNOSIS — E213 Hyperparathyroidism, unspecified: Secondary | ICD-10-CM | POA: Diagnosis not present

## 2016-03-10 NOTE — Progress Notes (Signed)
Patient ID: Bonnie Chavez, female   DOB: 1934/05/18, 82 y.o.   MRN: [id number]      HPI: Bonnie Chavez is a 82 y.o. female presents to the office today for a 7 month cardiology evaluation.  Bonnie Chavez has a history of hypertension with documented grade 1 diastolic dysfunction, hyperlipidemia, type 2 diabetes mellitus with renal insufficiency, as well as aortic valve sclerosis without stenosis. An echo Doppler study in 2011 showed an EF of 55%.  In 2011 a nuclear perfusion scan showed normal perfusion and function.  She has a history of a goiter for which she sees [name], [name] for her renal insufficiency, [name] for uterine polyps and fibroids.  She has a history of hyperparathyroidism and saw [name]; she has not had surgery in his undergoing continued surveillance of her calcium levels.  Presently, she did denies chest pain.  She admits to decreased energy and fatigue.  She denies PND, orthopnea.  She denies palpitations.  There is no presyncope or syncope.  She has a history of diabetes mellitus for 21 years and sees [name].  She sees [name] for renal insufficiency.   She has been on Bystolic 5 mg twice a day, lisinopril 10 mg, furosemide 40 mg daily, in addition to amlodipine, 10 mg. for hypertension and leg swelling.  She is on Tragenta 5 mg , and Humulin insulin for her diabetes.  She has been on Crestor 40 mg for hyperlipidemia.    She underwent a five-year follow-up echo Doppler study on 08/23/2015.  This continued to show normal systolic function with mild LVH and grade 1 diastolic dysfunction.  Now had a 10 mm mean gradient and 22 mm peak gradient across her moderately calcified aortic valve with a valve area of 1.63 cm.  She presents for evaluation.   Past Medical History  Diagnosis Date  . Hypertension   . Diabetes mellitus     type 2, sees Dr. Chalmers Cater  . Chronic diastolic CHF (congestive heart failure) ([place])   . Hyperlipidemia   . Osteoarthritis     DDD,  sees [place] [facility]  . PMB (postmenopausal bleeding) 02/28/2009    Resolved  . Vaginal atrophy   . Vitamin D deficiency   . Mitral valve prolapse   . Migraine     no longer has migraines (80's)  . Renal insufficiency     [name]   . Aortic valve sclerosis   . Goiter     Dr. Chalmers Cater  . Heart murmur   . History of nuclear stress test 06/2010    dipyridamole; normal pattern of perfusion; ekg negative for ischemia; low risk scan   . Chicken pox   . Hot flashes   . Carpal tunnel syndrome   . Obesity 02/27/2016  . Chronic low back pain 02/27/2016    -DDD -seeing [place] [facility]   . Hyperparathyroidism ([place]) 02/27/2016    -sees endocrinologist, Dr. Chalmers Cater, has seen surgeon as well     Past Surgical History  Procedure Laterality Date  . Cesarean section  1972 &1976  . Tubal ligation  1976  . Replacement total knee  2000    left  . Dilation and curettage of uterus  1975  . Cholecystectomy  1991  . Hemorrhoid surgery  1997  . Knee surgery  2000 2011    right knee replacement   . Shoulder surgery  2004 2010   . Dilatation & currettage/hysteroscopy with resectocope N/A 05/15/2013    Procedure: DILATATION &  CURETTAGE/HYSTEROSCOPY WITH RESECTOCOPE;  Surgeon: Eldred Manges, MD;  Location: [facility];  Service: Gynecology;  Laterality: N/A;  . Transthoracic echocardiogram  07/2010    EF=>55%; LA mildly dilated; trace MR/TR;    Allergies  Allergen Reactions  . Contrast Media [Iodinated Diagnostic Agents]   . Nsaids     Abnormal renal function  . Penicillins Rash    Pt has taken Keflex without difficulty    Current Outpatient Prescriptions  Medication Sig Dispense Refill  . amLODipine (NORVASC) 10 MG tablet TAKE 1 TABLET BY MOUTH AT BEDTIME. 30 tablet 5  . aspirin (ECOTRIN LOW STRENGTH) 81 MG EC tablet Take 81 mg by mouth daily. Swallow whole.    . BD PEN NEEDLE NANO U/F 32G X 4 MM MISC See admin instructions.  4  . Blood Glucose Monitoring Suppl (ONE TOUCH ULTRA MINI) W/DEVICE KIT  Use as directed 1 each 0  . CRESTOR 40 MG tablet TAKE 1 TABLET BY MOUTH AT BEDTIME 30 tablet 5  . estradiol (VIVELLE-DOT) 0.05 MG/24HR patch Place 0.05 patches onto the skin 2 (two) times a week.  12  . furosemide (LASIX) 20 MG tablet Take 40 mg by mouth daily.    [name] Kitchen gabapentin (NEURONTIN) 100 MG capsule TAKE ONE CAPSULE BY MOUTH 3 TIMES A DAY AS NEEDED 30 capsule 2  . HUMULIN N KWIKPEN 100 UNIT/ML Kiwkpen 12 Units at bedtime.  6  . Lancets Misc. KIT 1 Device by Does not apply route 2 (two) times daily. 100 each 3  . lisinopril (PRINIVIL,ZESTRIL) 10 MG tablet TAKE 1 TABLET BY MOUTH EVERY DAY 30 tablet 5  . nebivolol (BYSTOLIC) 5 MG tablet Take 1 tablet (5 mg total) by mouth 2 (two) times daily. 60 tablet 11  . ONE TOUCH ULTRA TEST test strip USE TWICE A DAY AND AS NEEDED 100 each 3  . progesterone (PROMETRIUM) 200 MG capsule TAKE ONE CAPSULE BY MOUTH DAILY AT DINNER  12  . pyridOXINE (VITAMIN B-6) 100 MG tablet Take 200 mg by mouth daily.    . TRADJENTA 5 MG TABS tablet TAKE 1 TABLET BY MOUTH EVERY DAY 90 tablet 1   No current facility-administered medications for this visit.    Social History   Social History  . Marital Status: Divorced    Spouse Name: N/A  . Number of Children: 2  . Years of Education: N/A   Occupational History  . Not on file.   Social History Main Topics  . Smoking status: Never Smoker   . Smokeless tobacco: Not on file  . Alcohol Use: No  . Drug Use: No  . Sexual Activity: Not on file   Other Topics Concern  . Not on file   Social History Narrative   Work or School: none      Home Situation: lives alone, feels safe, son lives nearby      Spiritual Beliefs: Christian      Lifestyle: Y 3x per week, healthy diet         Social history is notable that she's divorced has 2 children one grandchild. She does exercise. There is no tobacco or alcohol use..  Family History  Problem Relation Age of Onset  . Hypertension Mother   . Diabetes Mother   .  Hyperlipidemia Mother   . Heart attack Mother   . Heart disease Father   . Heart attack Father   . Stroke Brother   . Heart disease Brother   . Diabetes Brother   .  Uterine cancer Maternal Grandmother     ROS General: Negative; No fevers, chills, or night sweats;  HEENT: Negative; No changes in vision or hearing, sinus congestion, difficulty swallowing Pulmonary: Negative; No cough, wheezing, shortness of breath, hemoptysis Cardiovascular: See history of present illness Positive for occasional leg swelling GI: Negative; No nausea, vomiting, diarrhea, or abdominal pain GU: Negative; No dysuria, hematuria, or difficulty voiding Musculoskeletal: Negative; no myalgias, joint pain, or weakness Hematologic/Oncology: Negative; no easy bruising, bleeding Endocrine: Positive for diabetes mellitus, positive for hyperparathyroidism Neuro: Negative; no changes in balance, headaches Skin: Negative; No rashes or skin lesions Psychiatric: Negative; No behavioral problems, depression Sleep: Negative; No snoring, daytime sleepiness, hypersomnolence, bruxism, restless legs, hypnogognic hallucinations, no cataplexy Other comprehensive 14 point system review is negative.   PE BP 118/55 mmHg  Pulse 61  Ht '5\' 5"'$  (1.651 m)  Wt 196 lb 9.6 oz (89.177 kg)  BMI 32.72 kg/m2   Repeat blood pressure by me was 124/68.  Wt Readings from Last 3 Encounters:  03/09/16 196 lb 9.6 oz (89.177 kg)  02/27/16 192 lb 8 oz (87.317 kg)  10/28/15 192 lb 9.6 oz (87.363 kg)    General: Alert, oriented, no distress.  Skin: normal turgor, no rashes HEENT: Normocephalic, atraumatic. Pupils round and reactive; sclera anicteric;no lid lag.  Nose without nasal septal hypertrophy Mouth/Parynx benign; Mallinpatti scale 2 Neck: Palpable thyroid, No JVD, no carotid bruits with normal carotid upstroke Lungs: clear to ausculatation and percussion; no wheezing or rales Chest wall: Nontender to palpation Heart: RRR, s1 s2  normal 2/6 systolic murmur in the aortic area, no S3 gallop.  No rubs thrills or heaves. Abdomen: soft, nontender; no hepatosplenomehaly, BS+; abdominal aorta nontender and not dilated by palpation. Back: No CVA tenderness Pulses 2+ Extremities: no clubbing cyanosis or edema, Homan's sign negative  Neurologic: grossly nonfocal Psychological: Normal affect and mood  ECG (independently read by me): Normal sinus rhythm at 61 bpm with first-degree AV block with a PR interval of 244 ms.  LVH by voltage.  September 2016 ECG (independently read by me): Normal sinus rhythm at 60 bpm.  First degree block with a PR interval at 252 ms.  No significant ST-T changes.  October 2015 ECG (independently read by me): Sinus bradycardia at 57 beats per minute.  First degree AV block with a PR interval of 236 ms   Prior September 2014 ECG: Sinus rhythm at 60 beats per minute. First grade AV block with PR interval of 244 ms. QTC interval 356 ms  LABS: BMP Latest Ref Rng 01/30/2016 06/05/2014 05/24/2014  Glucose 70 - 99 mg/dL - - 148(H)  BUN 4 - 21 mg/dL 49(A) - 38(H)  Creatinine .5 - 1.1 mg/dL 1.8(A) - 1.7(H)  Sodium 137 - 147 mmol/L 135(A) - 135  Potassium 3.4 - 5.3 mmol/L 4.3 - 4.2  Chloride 96 - 112 mEq/L - - 103  CO2 19 - 32 mEq/L - - 27  Calcium 8.7 - 10.3 mg/dL - 10.9(H) 10.7(H)   Hepatic Function Latest Ref Rng 05/24/2014 01/23/2014 10/25/2013  Total Protein 6.0 - 8.3 g/dL 8.0 7.8 7.9  Albumin 3.5 - 5.2 g/dL 4.0 3.9 3.8  AST 0 - 37 U/L '25 23 26  '$ ALT 0 - 35 U/L '15 16 22  '$ Alk Phosphatase 39 - 117 U/L 44 43 46  Total Bilirubin 0.2 - 1.2 mg/dL 0.6 0.5 0.5  Bilirubin, Direct 0.0 - 0.3 mg/dL 0.1 0.0 0.0   CBC Latest Ref Rng 05/10/2013 05/02/2013 09/15/2012  WBC 4.0 - 10.5 K/uL 9.5 9.9 8.5  Hemoglobin 12.0 - 15.0 g/dL 12.9 12.2 13.1  Hematocrit 36.0 - 46.0 % 37.6 36.0 38.9  Platelets 150 - 400 K/uL 206 209.0 214.0   Lab Results  Component Value Date   MCV 97.2 05/10/2013   MCV 100.2* 05/02/2013   MCV  99.1 09/15/2012   Lab Results  Component Value Date   TSH 2.00 01/30/2016   Lab Results  Component Value Date   HGBA1C 7.4 01/30/2016   Lab Results  Component Value Date   CALCIUM 10.9* 06/05/2014   Lipid Panel     Component Value Date/Time   CHOL 146 02/28/2015 0928   TRIG 78.0 02/28/2015 0928   HDL 42.60 02/28/2015 0928   CHOLHDL 3 02/28/2015 0928   VLDL 15.6 02/28/2015 0928   LDLCALC 88 02/28/2015 0928    RADIOLOGY: No results found.    ASSESSMENT AND PLAN: Ms. Doria is a 82 year old African-American female Who has a long-standing history of hypertension with grade 1 diastolic dysfunction and normal systolic function documented by her echocardiogram in September 2011. At that time she had aortic valve sclerosis without stenosis and  mitral annular calcification with trace MR and trace TR.  Her blood pressure today is stable on her current regimen consisting of amlodipine 10 mg, furosemide 40 g daily, lisinopril 10 mg in addition to her Bystolic.  When I last saw her, I reduced her Bystolic to 10 mg daily from a previous dose of 15 mg.  She continues to have first-degree AV block and resting pulse is 61.  I reviewed her most recent echo Doppler study which showed an ejection fraction of 55-60%, mild LVH and grade 1 diastolic dysfunction.  There is very mild aortic stenosis with a mean gradient of 10 and peak gradient of 22.  She will be seeing  [name]  and Dr. Suzette Battiest  for follow-up of her renal function and goiter.  She recently has had issues with slight calcium elevation but this has normalized.  I will see her in one year for cardiology reevaluation.   Time spent: 25 minutes  Troy Sine, MD, [facility]  03/10/2016 9:43 PM

## 2016-03-12 ENCOUNTER — Telehealth: Payer: Self-pay | Admitting: Family Medicine

## 2016-03-12 NOTE — Telephone Encounter (Signed)
I called the pt and informed her of the message below

## 2016-03-12 NOTE — Telephone Encounter (Signed)
Her recent cardiologist notes state that she has both of these diagnoses. These are not diagnoses that I made. These are part of her past medical history and I am not able to remove them. She can speak with medical records about this, however she may wish to request her recent cardiology notes, as both of these diagnoses are stated in those notes.

## 2016-03-12 NOTE — Telephone Encounter (Signed)
Pt need to speak with you personally did not want to discuss.

## 2016-03-12 NOTE — Telephone Encounter (Signed)
I called the pt and she stated she was seen by Dr Claiborne Billings and was told she did not have aortic valve sclerosis nor chronic diastolic CHF and she wants Dr Maudie Mercury to remove this from her office visit dated 4/6.

## 2016-03-24 ENCOUNTER — Other Ambulatory Visit: Payer: Self-pay | Admitting: Cardiovascular Disease

## 2016-03-24 NOTE — Telephone Encounter (Signed)
REFILL 

## 2016-05-21 DIAGNOSIS — Z6834 Body mass index (BMI) 34.0-34.9, adult: Secondary | ICD-10-CM | POA: Diagnosis not present

## 2016-05-21 DIAGNOSIS — Z01419 Encounter for gynecological examination (general) (routine) without abnormal findings: Secondary | ICD-10-CM | POA: Diagnosis not present

## 2016-05-21 DIAGNOSIS — N951 Menopausal and female climacteric states: Secondary | ICD-10-CM | POA: Diagnosis not present

## 2016-05-21 DIAGNOSIS — Z1231 Encounter for screening mammogram for malignant neoplasm of breast: Secondary | ICD-10-CM | POA: Diagnosis not present

## 2016-07-01 NOTE — Progress Notes (Signed)
Medicare Annual Preventive Care Visit  (initial annual wellness or annual wellness exam)  Concerns and/or follow up today:  CdCHF/HLD/aortic valve sclerosis/Obesity: -sees cardiologist (Dr. Claiborne Billings) -meds:amlodipine 10, crestor 40, lasix 20, lisinopril 10, nebivolol 5, asa -exercises [facility]x per week at [employer] in [facility] and denies CP, SOB, DOE  DM, Hyperparathyroidism: -sees endocriologist (Dr. Chalmers Cater), will be seeing soon - wants to check labs here today -brings labs from 01/30/16 with Dr. Suzette Battiest, Hgba1c 7.4, Cr 1.8, Ca ok -meds: tradjenta 5; insulin -eye exam with [facility] care 02/2016 -Denies low blood sugars, vision changes or wounds  CKD/vit D def: -sees nephrology ([name]), will be seeing in a few weeks, wants to check labs -Also had labs with nephrologist in early March; lab report reviewed and scanned into chart -reports told to stop vit D  HRT for Hot Flashes, Uterine Polyps: -sees gynecologist ([name])  DDD/Lumbar radiculopathy/CTS: -seeing [place] [facility]  ROS: negative for report of fevers, unintentional weight loss, vision changes, vision loss, hearing loss or change, chest pain, sob, hemoptysis, melena, hematochezia, hematuria, genital discharge or lesions, falls, bleeding or bruising, loc, thoughts of suicide or self harm, memory loss  1.) Patient-completed health risk assessment  - completed and reviewed, see scanned documentation  2.) Review of Medical History: -PMH, PSH, Family History and current specialty and care providers reviewed and updated and listed below  - see scanned in document in chart and below  Past Medical History:  Diagnosis Date  . Aortic valve sclerosis   . Carpal tunnel syndrome   . Chicken pox   . Chronic diastolic CHF (congestive heart failure) ([place])   . Chronic low back pain 02/27/2016   -DDD -seeing [place] [facility]   . Diabetes mellitus    type 2, sees Dr. Chalmers Cater  . Goiter    Dr. Chalmers Cater  . Heart murmur   . History  of nuclear stress test 06/2010   dipyridamole; normal pattern of perfusion; ekg negative for ischemia; low risk scan   . Hot flashes   . Hyperlipidemia   . Hyperparathyroidism ([place]) 02/27/2016   -sees endocrinologist, Dr. Chalmers Cater, has seen surgeon as well   . Hypertension   . Migraine    no longer has migraines (80's)  . Mitral valve prolapse   . Obesity 02/27/2016  . Osteoarthritis    DDD, sees [place] orth[facility]  . PMB (postmenopausal bleeding) 02/28/2009   Resolved  . Renal insufficiency    [name]   . Vaginal atrophy   . Vitamin D deficiency     Past Surgical History:  Procedure Laterality Date  . [place] &1976  . CHOLECYSTECTOMY  1991  . DILATATION & CURRETTAGE/HYSTEROSCOPY WITH RESECTOCOPE N/A 05/15/2013   Procedure: [place];  Surgeon: Eldred Manges, MD;  Location: [facility];  Service: Gynecology;  Laterality: N/A;  . DILATION AND CURETTAGE OF UTERUS  1975  . [place]  . KNEE SURGERY  2000 2011   right knee replacement   . REPLACEMENT TOTAL KNEE  2000   left  . SHOULDER SURGERY  2004 2010   . TRANSTHORACIC ECHOCARDIOGRAM  07/2010   EF=>55%; LA mildly dilated; trace MR/TR;  . TUBAL LIGATION  1976    Social History   Social History  . Marital status: Divorced    Spouse name: N/A  . Number of children: 2  . Years of education: N/A   Occupational History  . Not on file.   Social  History Main Topics  . Smoking status: Never Smoker  . Smokeless tobacco: Not on file  . Alcohol use No  . Drug use: No  . Sexual activity: Not on file   Other Topics Concern  . Not on file   Social History Narrative   Work or School: none      Home Situation: lives alone, feels safe, son lives nearby      Spiritual Beliefs: Christian      Lifestyle: Y 3x per week, healthy diet          Family History  Problem Relation Age of Onset  . Hypertension Mother   . Diabetes Mother   . Hyperlipidemia Mother    . Heart attack Mother   . Heart disease Father   . Heart attack Father   . Stroke Brother   . Heart disease Brother   . Diabetes Brother   . Uterine cancer Maternal Grandmother     Current Outpatient Prescriptions on File Prior to Visit  Medication Sig Dispense Refill  . amLODipine (NORVASC) 10 MG tablet TAKE 1 TABLET BY MOUTH AT BEDTIME. 30 tablet 11  . aspirin (ECOTRIN LOW STRENGTH) 81 MG EC tablet Take 81 mg by mouth daily. Swallow whole.    . BD PEN NEEDLE NANO U/F 32G X 4 MM MISC See admin instructions.  4  . Blood Glucose Monitoring Suppl (ONE TOUCH ULTRA MINI) W/DEVICE KIT Use as directed 1 each 0  . estradiol (VIVELLE-DOT) 0.05 MG/24HR patch Place 0.05 patches onto the skin 2 (two) times a week.  12  . furosemide (LASIX) 20 MG tablet Take 40 mg by mouth daily.    [name] Kitchen gabapentin (NEURONTIN) 100 MG capsule TAKE ONE CAPSULE BY MOUTH 3 TIMES A DAY AS NEEDED (Patient taking differently: Take one caosule by mouth at bedtime) 30 capsule 2  . HUMULIN N KWIKPEN 100 UNIT/ML Kiwkpen 14 Units at bedtime.   6  . Lancets Misc. KIT 1 Device by Does not apply route 2 (two) times daily. 100 each 3  . lisinopril (PRINIVIL,ZESTRIL) 10 MG tablet TAKE 1 TABLET BY MOUTH EVERY DAY 30 tablet 11  . nebivolol (BYSTOLIC) 5 MG tablet Take 1 tablet (5 mg total) by mouth 2 (two) times daily. 60 tablet 11  . ONE TOUCH ULTRA TEST test strip USE TWICE A DAY AND AS NEEDED 100 each 3  . progesterone (PROMETRIUM) 200 MG capsule TAKE ONE CAPSULE BY MOUTH DAILY AT DINNER  12  . pyridOXINE (VITAMIN B-6) 100 MG tablet Take 200 mg by mouth daily.    . rosuvastatin (CRESTOR) 40 MG tablet TAKE 1 TABLET BY MOUTH AT BEDTIME 30 tablet 11  . TRADJENTA 5 MG TABS tablet TAKE 1 TABLET BY MOUTH EVERY DAY 90 tablet 1   No current facility-administered medications on file prior to visit.      3.) Review of functional ability and level of safety:  Any difficulty hearing?  Some, considering hearing aides, seeing ENT  History  of falling?  NO  Any trouble with IADLs - using a phone, using transportation, grocery shopping, preparing meals, doing housework, doing laundry, taking medications and managing money? NO  Advance Directives? Yes, agrees to bring copy  See summary of recommendations in Patient Instructions below.  4.) Physical Exam Vitals:   07/02/16 0759  BP: 116/68  Pulse: 63  Temp: 98 F (36.7 C)   Estimated body mass index is 34.25 kg/m as calculated from the following:   Height as of this  encounter: 5' 4.25" (1.632 m).   Weight as of this encounter: 201 lb 1.6 oz (91.2 kg).  EKG (optional): deferred  General: alert, appear well hydrated and in no acute distress  HEENT: visual acuity grossly intact  CV: HRRR, SEM  Lungs: CTA bilaterally  Psych: pleasant and cooperative, no obvious depression or anxiety  Cognitive function grossly intact  See patient instructions for recommendations.  Education and counseling regarding the above review of health provided with a plan for the following: -see scanned patient completed form for further details -fall prevention strategies discussed  -healthy lifestyle discussed -importance and resources for completing advanced directives discussed -see patient instructions below for any other recommendations provided  4)The following written screening schedule of preventive measures were reviewed with assessment and plan made per below, orders and patient instructions:       Alcohol screening done     Obesity Screening and counseling done     Tobacco Screening done        Pneumococcal (PPSV23 -one dose after 64, one before if risk factors), influenza yearly and hepatitis B vaccines (if high risk - end stage renal disease, IV drugs, homosexual men, live in home for mentally retarded, hemophilia receiving factors) ASSESSMENT/PLAN: done       Screening mammograph (yearly if >40) ASSESSMENT/PLAN: utd per HM, sees gyn      Screening Pap  smear/pelvic exam (q2 years) ASSESSMENT/PLAN: n/a, declined, sees gyn      Colorectal cancer screening (FOBT yearly or flex sig q4y or colonoscopy q10y or barium enema q4y) ASSESSMENT/PLAN: utd/na      Diabetes outpatient self-management training services ASSESSMENT/PLAN: utd, sees endo      Bone mass measurements(covered q2y if indicated - estrogen def, osteoporosis, hyperparathyroid, vertebral abnormalities, osteoporosis or steroids) ASSESSMENT/PLAN: sees gyn, we ordered in 2014, but appears not done, she reports does with Dr. Pennie Rushing and declines ordering here      Screening for glaucoma(q1y if high risk - diabetes, FH, AA and > 50 or hispanic and > 65) ASSESSMENT/PLAN: yearly exam with [facility]      Medical nutritional therapy for individuals with diabetes or renal disease ASSESSMENT/PLAN: see orders      Cardiovascular screening blood tests (lipids q5y) ASSESSMENT/PLAN: see orders and labs      Diabetes screening tests ASSESSMENT/PLAN: see orders and labs   7.) Summary: -risk factors and conditions per above assessment were discussed and treatment, recommendations and referrals were offered per documentation above and orders and patient instructions.  Medicare annual wellness visit, subsequent  Essential hypertension - Plan: CBC (no diff), Basic metabolic panel  Hyperlipidemia - Plan: Lipid panel  Chronic kidney disease, stage 3 (moderate)  Type 2 diabetes mellitus with chronic kidney disease, with long-term current use of insulin, unspecified CKD stage (HCC) - Plan: Hemoglobin A1c  Hyperparathyroidism (HCC)  Obesity  Patient Instructions  BEFORE YOU LEAVE: -follow up: 4-6 months -labs  Flu shot in 1-2 months.  Bring a copy of your advanced directives to the next appointment.  We recommend the following healthy lifestyle: 1) Small portions - eat off of salad plate instead of dinner plate 2) Eat a healthy clean diet with avoidance of (less then 1 serving  per week) processed foods, sweetened drinks, white starches, red meat, fast foods and sweets and consisting of: * 5-9 servings per day of fresh or frozen fruits and vegetables (not corn or potatoes, not dried or canned) *nuts and seeds, beans *olives and olive oil *small portions of  lean meats such as fish and white chicken  *small portions of whole grains 3)Get at least 150 minutes of sweaty aerobic exercise per week 4)reduce stress - counseling, meditation, relaxation to balance other aspects of your life  We have ordered labs or studies at this visit. It can take up to 1-2 weeks for results and processing. IF results require follow up or explanation, we will call you with instructions. Clinically stable results will be released to your [facility]. If you have not heard from [place] or cannot find your results in [facility] in 2 weeks please contact our office at [phone].  If you are not yet signed up for [facility], please consider signing up.          Colin Benton R., DO

## 2016-07-02 ENCOUNTER — Encounter: Payer: Self-pay | Admitting: Family Medicine

## 2016-07-02 ENCOUNTER — Ambulatory Visit (INDEPENDENT_AMBULATORY_CARE_PROVIDER_SITE_OTHER): Payer: Medicare Other | Admitting: Family Medicine

## 2016-07-02 ENCOUNTER — Encounter: Payer: Medicare Other | Admitting: Family Medicine

## 2016-07-02 VITALS — BP 116/68 | HR 63 | Temp 98.0°F | Ht 64.25 in | Wt 201.1 lb

## 2016-07-02 DIAGNOSIS — I10 Essential (primary) hypertension: Secondary | ICD-10-CM

## 2016-07-02 DIAGNOSIS — Z794 Long term (current) use of insulin: Secondary | ICD-10-CM | POA: Diagnosis not present

## 2016-07-02 DIAGNOSIS — E785 Hyperlipidemia, unspecified: Secondary | ICD-10-CM | POA: Diagnosis not present

## 2016-07-02 DIAGNOSIS — E1122 Type 2 diabetes mellitus with diabetic chronic kidney disease: Secondary | ICD-10-CM | POA: Diagnosis not present

## 2016-07-02 DIAGNOSIS — E669 Obesity, unspecified: Secondary | ICD-10-CM

## 2016-07-02 DIAGNOSIS — N183 Chronic kidney disease, stage 3 unspecified: Secondary | ICD-10-CM

## 2016-07-02 DIAGNOSIS — Z0000 Encounter for general adult medical examination without abnormal findings: Secondary | ICD-10-CM

## 2016-07-02 DIAGNOSIS — E213 Hyperparathyroidism, unspecified: Secondary | ICD-10-CM

## 2016-07-02 LAB — BASIC METABOLIC PANEL
BUN: 31 mg/dL — ABNORMAL HIGH (ref 6–23)
CO2: 27 mEq/L (ref 19–32)
Calcium: 10.6 mg/dL — ABNORMAL HIGH (ref 8.4–10.5)
Chloride: 104 mEq/L (ref 96–112)
Creatinine, Ser: 1.57 mg/dL — ABNORMAL HIGH (ref 0.40–1.20)
GFR: 40.74 mL/min — AB (ref >60.00)
GLUCOSE: 115 mg/dL — AB (ref 70–99)
POTASSIUM: 4.5 meq/L (ref 3.5–5.1)
Sodium: 138 mEq/L (ref 135–145)

## 2016-07-02 LAB — LIPID PANEL
Cholesterol: 154 mg/dL (ref 0–200)
HDL: 46.2 mg/dL (ref >39.00)
LDL Cholesterol: 93 mg/dL (ref 0–99)
NONHDL: 107.31
Total CHOL/HDL Ratio: 3
Triglycerides: 70 mg/dL (ref 0.0–149.0)
VLDL: 14 mg/dL (ref 0.0–40.0)

## 2016-07-02 LAB — CBC
HCT: 36.5 % (ref 36.0–46.0)
Hemoglobin: 12.3 g/dL (ref 12.0–15.0)
MCHC: 33.5 g/dL (ref 30.0–36.0)
MCV: 96.1 fl (ref 78.0–100.0)
Platelets: 191 10*3/uL (ref 150.0–400.0)
RBC: 3.8 Mil/uL — AB (ref 3.87–5.11)
RDW: 13.3 % (ref 11.5–15.5)
WBC: 7.7 10*3/uL (ref 4.0–10.5)

## 2016-07-02 LAB — HEMOGLOBIN A1C: Hgb A1c MFr Bld: 6.7 % — ABNORMAL HIGH (ref 4.6–6.5)

## 2016-07-02 NOTE — Patient Instructions (Signed)
BEFORE YOU LEAVE: -follow up: 4-6 months -labs  Flu shot in 1-2 months.  Bring a copy of your advanced directives to the next appointment.  We recommend the following healthy lifestyle: 1) Small portions - eat off of salad plate instead of dinner plate 2) Eat a healthy clean diet with avoidance of (less then 1 serving per week) processed foods, sweetened drinks, white starches, red meat, fast foods and sweets and consisting of: * 5-9 servings per day of fresh or frozen fruits and vegetables (not corn or potatoes, not dried or canned) *nuts and seeds, beans *olives and olive oil *small portions of lean meats such as fish and white chicken  *small portions of whole grains 3)Get at least 150 minutes of sweaty aerobic exercise per week 4)reduce stress - counseling, meditation, relaxation to balance other aspects of your life  We have ordered labs or studies at this visit. It can take up to 1-2 weeks for results and processing. IF results require follow up or explanation, we will call you with instructions. Clinically stable results will be released to your [facility]. If you have not heard from [place] or cannot find your results in [facility] in 2 weeks please contact our office at [phone].  If you are not yet signed up for [facility], please consider signing up.

## 2016-07-02 NOTE — Progress Notes (Signed)
Pre visit review using our clinic review tool, if applicable. No additional management support is needed unless otherwise documented below in the visit note. 

## 2016-07-03 ENCOUNTER — Encounter: Payer: Self-pay | Admitting: *Deleted

## 2016-07-14 ENCOUNTER — Encounter: Payer: Self-pay | Admitting: Family Medicine

## 2016-07-14 ENCOUNTER — Other Ambulatory Visit: Payer: Self-pay | Admitting: Endocrinology

## 2016-07-14 DIAGNOSIS — E119 Type 2 diabetes mellitus without complications: Secondary | ICD-10-CM | POA: Diagnosis not present

## 2016-07-14 DIAGNOSIS — E213 Hyperparathyroidism, unspecified: Secondary | ICD-10-CM | POA: Diagnosis not present

## 2016-07-14 DIAGNOSIS — E049 Nontoxic goiter, unspecified: Secondary | ICD-10-CM

## 2016-07-14 DIAGNOSIS — I10 Essential (primary) hypertension: Secondary | ICD-10-CM | POA: Diagnosis not present

## 2016-07-14 DIAGNOSIS — E1129 Type 2 diabetes mellitus with other diabetic kidney complication: Secondary | ICD-10-CM | POA: Diagnosis not present

## 2016-07-23 DIAGNOSIS — N183 Chronic kidney disease, stage 3 (moderate): Secondary | ICD-10-CM | POA: Diagnosis not present

## 2016-07-23 DIAGNOSIS — Z6832 Body mass index (BMI) 32.0-32.9, adult: Secondary | ICD-10-CM | POA: Diagnosis not present

## 2016-07-23 DIAGNOSIS — E785 Hyperlipidemia, unspecified: Secondary | ICD-10-CM | POA: Diagnosis not present

## 2016-07-23 DIAGNOSIS — E1129 Type 2 diabetes mellitus with other diabetic kidney complication: Secondary | ICD-10-CM | POA: Diagnosis not present

## 2016-07-23 DIAGNOSIS — E039 Hypothyroidism, unspecified: Secondary | ICD-10-CM | POA: Diagnosis not present

## 2016-07-23 DIAGNOSIS — D631 Anemia in chronic kidney disease: Secondary | ICD-10-CM | POA: Diagnosis not present

## 2016-07-23 DIAGNOSIS — I129 Hypertensive chronic kidney disease with stage 1 through stage 4 chronic kidney disease, or unspecified chronic kidney disease: Secondary | ICD-10-CM | POA: Diagnosis not present

## 2016-07-23 DIAGNOSIS — N2581 Secondary hyperparathyroidism of renal origin: Secondary | ICD-10-CM | POA: Diagnosis not present

## 2016-07-23 DIAGNOSIS — M1990 Unspecified osteoarthritis, unspecified site: Secondary | ICD-10-CM | POA: Diagnosis not present

## 2016-07-24 ENCOUNTER — Ambulatory Visit
Admission: RE | Admit: 2016-07-24 | Discharge: 2016-07-24 | Disposition: A | Discharge status: Home / Self Care | Payer: Medicare Other | Source: Ambulatory Visit | Attending: Endocrinology | Admitting: Endocrinology

## 2016-07-24 DIAGNOSIS — E049 Nontoxic goiter, unspecified: Secondary | ICD-10-CM

## 2016-07-24 DIAGNOSIS — E042 Nontoxic multinodular goiter: Secondary | ICD-10-CM | POA: Diagnosis not present

## 2016-07-28 ENCOUNTER — Other Ambulatory Visit: Payer: Self-pay

## 2016-08-10 DIAGNOSIS — N183 Chronic kidney disease, stage 3 (moderate): Secondary | ICD-10-CM | POA: Diagnosis not present

## 2016-08-14 ENCOUNTER — Emergency Department (HOSPITAL_COMMUNITY)
Admission: EM | Admit: 2016-08-14 | Discharge: 2016-08-14 | Disposition: A | Discharge status: Home / Self Care | Payer: BLUE CROSS/BLUE SHIELD | Attending: Emergency Medicine | Admitting: Emergency Medicine

## 2016-08-14 ENCOUNTER — Encounter (HOSPITAL_COMMUNITY): Payer: Self-pay

## 2016-08-14 ENCOUNTER — Ambulatory Visit: Payer: Medicare Other

## 2016-08-14 DIAGNOSIS — Y9389 Activity, other specified: Secondary | ICD-10-CM | POA: Insufficient documentation

## 2016-08-14 DIAGNOSIS — Y92410 Unspecified street and highway as the place of occurrence of the external cause: Secondary | ICD-10-CM | POA: Diagnosis not present

## 2016-08-14 DIAGNOSIS — Y999 Unspecified external cause status: Secondary | ICD-10-CM | POA: Diagnosis not present

## 2016-08-14 DIAGNOSIS — E1122 Type 2 diabetes mellitus with diabetic chronic kidney disease: Secondary | ICD-10-CM | POA: Insufficient documentation

## 2016-08-14 DIAGNOSIS — M25511 Pain in right shoulder: Secondary | ICD-10-CM | POA: Diagnosis not present

## 2016-08-14 DIAGNOSIS — I5032 Chronic diastolic (congestive) heart failure: Secondary | ICD-10-CM | POA: Insufficient documentation

## 2016-08-14 DIAGNOSIS — M25512 Pain in left shoulder: Secondary | ICD-10-CM | POA: Diagnosis not present

## 2016-08-14 DIAGNOSIS — I130 Hypertensive heart and chronic kidney disease with heart failure and stage 1 through stage 4 chronic kidney disease, or unspecified chronic kidney disease: Secondary | ICD-10-CM | POA: Diagnosis not present

## 2016-08-14 DIAGNOSIS — Z79899 Other long term (current) drug therapy: Secondary | ICD-10-CM | POA: Insufficient documentation

## 2016-08-14 DIAGNOSIS — N189 Chronic kidney disease, unspecified: Secondary | ICD-10-CM | POA: Insufficient documentation

## 2016-08-14 DIAGNOSIS — M542 Cervicalgia: Secondary | ICD-10-CM | POA: Diagnosis not present

## 2016-08-14 DIAGNOSIS — Z7982 Long term (current) use of aspirin: Secondary | ICD-10-CM | POA: Diagnosis not present
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
## 2016-08-14 NOTE — ED Notes (Signed)
Patient has bathing suit on and pulled bathing suit down below level of abdomen. Patient did not want to fully undress when asked to for physician examination.
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
## 2016-08-14 NOTE — Discharge Instructions (Signed)
Use tylenol for pain and warm compresses. Follow-up with your family care provider in 2-3 days if symptoms are not improving or worsening.  Return immediately to the emergency department if you experience headaches, visual changes, worsening neck pain, neck stiffness, chest pain, shortness of breath, belly pain, or any other concerning symptoms.
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
## 2016-08-25 ENCOUNTER — Telehealth: Payer: Self-pay | Admitting: Family Medicine

## 2016-08-25 ENCOUNTER — Other Ambulatory Visit: Payer: Self-pay | Admitting: Cardiovascular Disease

## 2016-08-25 DIAGNOSIS — Z23 Encounter for immunization: Secondary | ICD-10-CM | POA: Diagnosis not present

## 2016-08-25 NOTE — Telephone Encounter (Signed)
I updated the pts chart with this information.

## 2016-08-25 NOTE — Telephone Encounter (Signed)
FYI pt wanted to let you know that she got her Flu vaccine at [facility] at [facility] on today

## 2016-10-28 NOTE — Progress Notes (Signed)
HPI:  Bonnie Chavez is a pleasant 82 yo with a PMH significant for CdCHF, HLD and aortic valve sclerosis managed by her cardiologist, DM and hyperparathyroidism managed by her endocrinologist, CKI and Vit D deficiency seeing a nephrologist and Hot flashes here for her Medicare wellness visit. See notes by Wynetta Fines for details of this visit. She reports she feels great. [facility] No CP, SOB, DOE, increase swelling or any concerns today. Fasting for labs. Due for foot exam, Tdap, shingles vaccine - refused in the past  CdCHF/HLD/aortic valve sclerosis/Obesity: -sees cardiologist (Dr. Claiborne Billings) -meds:amlodipine 10, crestor 40, lasix 20, lisinopril 10, nebivolol 5, asa -exerc[facility]x per [facility] in [facility]obics  DM, Hyperparathyroidism: -sees endocriologist (Dr. Chalmers Cater) -labs from 06/2016 Hgba1c 6.7, Cr 1.57 -meds: tradjenta 5; insulin -eye exam with [facility] care 02/2016  CKD/vit D def: -sees nephrology ([name])  HRT for Hot Flashes, Uterine Polyps: -sees gynecologist ([name])  DDD/Lumbar radiculopathy/CTS: -seeing [facility]  ROS: See pertinent positives and negatives per HPI.  Past Medical History:  Diagnosis Date  . Aortic valve sclerosis   . Carpal tunnel syndrome   . Chicken pox   . Chronic diastolic CHF (congestive heart failure) ([place])   . Chronic low back pain 02/27/2016   -DDD -seeing [facility]   . Diabetes mellitus    type 2, sees Dr. Chalmers Cater  . Goiter    Dr. Chalmers Cater  . Heart murmur   . History of nuclear stress test 06/2010   dipyridamole; normal pattern of perfusion; ekg negative for ischemia; low risk scan   . Hot flashes   . Hyperlipidemia   . Hyperparathyroidism ([place]) 02/27/2016   -sees endocrinologist, Dr. Chalmers Cater, has seen surgeon as well   . Hypertension   . Migraine    no longer has migraines (80's)  . Mitral valve prolapse   . Obesity 02/27/2016  . Osteoarthritis    DDD,  sees [facility]  . PMB (postmenopausal bleeding) 02/28/2009   Resolved  . Renal insufficiency    [name]   . Vaginal atrophy   . Vitamin D deficiency     Past Surgical History:  Procedure Laterality Date  . [place] &1976  . CHOLECYSTECTOMY  1991  . DILATATION & CURRETTAGE/HYSTEROSCOPY WITH RESECTOCOPE N/A 05/15/2013   Procedure: [place];  Surgeon: Eldred Manges, MD;  Location: [facility];  Service: Gynecology;  Laterality: N/A;  . DILATION AND CURETTAGE OF UTERUS  1975  . [place]  . KNEE SURGERY  2000 2011   right knee replacement   . REPLACEMENT TOTAL KNEE  2000   left  . SHOULDER SURGERY  2004 2010   . TRANSTHORACIC ECHOCARDIOGRAM  07/2010   EF=>55%; LA mildly dilated; trace MR/TR;  . TUBAL LIGATION  1976    Family History  Problem Relation Age of Onset  . Hypertension Mother   . Diabetes Mother   . Hyperlipidemia Mother   . Heart attack Mother   . Heart disease Father   . Heart attack Father   . Stroke Brother   . Heart disease Brother   . Diabetes Brother   . Uterine cancer Maternal Grandmother     Social History   Social History  . Marital status: Divorced    Spouse name: N/A  . Number of children: 2  . Years of education: N/A   Social  History Main Topics  . Smoking status: Never Smoker  . Smokeless tobacco: Never Used  . Alcohol use No  . Drug use: No  . Sexual activity: Not Asked   Other Topics Concern  . None   Social History Narrative   Work or School: none      Home Situation: lives alone, feels safe, son lives nearby      Spiritual Beliefs: Christian      Lifestyle: [facility] 3x per week, healthy diet           Current Outpatient Prescriptions:  .  amLODipine (NORVASC) 10 MG tablet, TAKE 1 TABLET BY MOUTH AT BEDTIME., Disp: 30 tablet, Rfl: 11 .  aspirin (ECOTRIN LOW STRENGTH) 81 MG EC tablet, Take 81 mg by mouth at bedtime. Swallow whole. , Disp: , Rfl:  .  BD  PEN NEEDLE NANO U/F 32G X 4 MM MISC, See admin instructions., Disp: , Rfl: 4 .  Blood Glucose Monitoring Suppl (ONE TOUCH ULTRA MINI) W/DEVICE KIT, Use as directed, Disp: 1 each, Rfl: 0 .  BYSTOLIC 5 MG tablet, TAKE 1 TABLET BY MOUTH 2 TIMES DAILY., Disp: 60 tablet, Rfl: 11 .  estradiol (VIVELLE-DOT) 0.05 MG/24HR patch, Place 0.05 patches onto the skin 2 (two) times a week. Apply on Tuesday and Saturday., Disp: , Rfl: 12 .  furosemide (LASIX) 20 MG tablet, Take 40 mg by mouth daily., Disp: , Rfl:  .  gabapentin (NEURONTIN) 100 MG capsule, TAKE ONE CAPSULE BY MOUTH 3 TIMES A DAY AS NEEDED (Patient taking differently: Take one capsule by mouth at bedtime), Disp: 30 capsule, Rfl: 2 .  HUMULIN N KWIKPEN 100 UNIT/ML Kiwkpen, Inject 14 Units into the skin at bedtime. , Disp: , Rfl: 6 .  Lancets Misc. KIT, 1 Device by Does not apply route 2 (two) times daily., Disp: 100 each, Rfl: 3 .  lisinopril (PRINIVIL,ZESTRIL) 10 MG tablet, TAKE 1 TABLET BY MOUTH EVERY DAY (Patient taking differently: TAKE 1 TABLET BY MOUTH AT BEDTIME.), Disp: 30 tablet, Rfl: 11 .  ONE TOUCH ULTRA TEST test strip, USE TWICE A DAY AND AS NEEDED, Disp: 100 each, Rfl: 3 .  progesterone (PROMETRIUM) 200 MG capsule, Take 200 mg by mouth daily with supper., Disp: , Rfl:  .  pyridOXINE (VITAMIN B-6) 100 MG tablet, Take 200 mg by mouth daily., Disp: , Rfl:  .  rosuvastatin (CRESTOR) 40 MG tablet, TAKE 1 TABLET BY MOUTH AT BEDTIME, Disp: 30 tablet, Rfl: 11  EXAM:  Vitals:   10/29/16 0848  BP: 130/70  Pulse: 64  Temp: 98.2 F (36.8 C)    Body mass index is 34.4 kg/m.  GENERAL: vitals reviewed and listed above, alert, oriented, appears well hydrated and in no acute distress  HEENT: atraumatic, conjunttiva clear, no obvious abnormalities on inspection of external nose and ears  NECK: no obvious masses on inspection  LUNGS: clear to auscultation bilaterally, no wheezes, rales or rhonchi, good air movement  CV: HRRR, no peripheral  edema  MS: moves all extremities without noticeable abnormality  PSYCH: pleasant and cooperative, no obvious depression or anxiety  ASSESSMENT AND PLAN:  Discussed the following assessment and plan:  Type 2 diabetes mellitus with chronic kidney disease, with long-term current use of insulin, unspecified CKD stage (HCC) - Plan: Hemoglobin A1c  Hyperlipidemia, unspecified hyperlipidemia type  Essential hypertension - Plan: CBC (no diff), Basic metabolic panel  Stage 3 chronic kidney disease  Chronic diastolic congestive heart failure ([place])  Hyperparathyroidism ([place])  -congratulated and  supported on lifestyle changes -[name] will do foot exam, cover preventive care and discuss vaccines -labs ordered -I will review note for wellness visit -Patient advised to return or notify a doctor immediately if symptoms worsen or persist or new concerns arise.  Patient Instructions  BEFORE YOU LEAVE: -follow up: 3 months   We have ordered labs or studies at this visit. It can take up to 1-2 weeks for results and processing. IF results require follow up or explanation, we will call you with instructions. Clinically stable results will be released to your [facility]. If you have not heard from [place] or cannot find your results in [facility] in 2 weeks please contact our office at [phone].  If you are not yet signed up for [facility], please consider signing up.          Colin Benton R., DO

## 2016-10-29 ENCOUNTER — Encounter: Payer: Self-pay | Admitting: Family Medicine

## 2016-10-29 ENCOUNTER — Ambulatory Visit (INDEPENDENT_AMBULATORY_CARE_PROVIDER_SITE_OTHER): Payer: Medicare Other

## 2016-10-29 ENCOUNTER — Ambulatory Visit (INDEPENDENT_AMBULATORY_CARE_PROVIDER_SITE_OTHER): Payer: Medicare Other | Admitting: Family Medicine

## 2016-10-29 VITALS — BP 130/70 | HR 64 | Temp 98.2°F | Ht 64.0 in | Wt 200.4 lb

## 2016-10-29 VITALS — BP 130/70 | HR 64 | Ht 60.0 in | Wt 200.5 lb

## 2016-10-29 DIAGNOSIS — Z794 Long term (current) use of insulin: Secondary | ICD-10-CM

## 2016-10-29 DIAGNOSIS — I5032 Chronic diastolic (congestive) heart failure: Secondary | ICD-10-CM

## 2016-10-29 DIAGNOSIS — E1122 Type 2 diabetes mellitus with diabetic chronic kidney disease: Secondary | ICD-10-CM | POA: Diagnosis not present

## 2016-10-29 DIAGNOSIS — Z0000 Encounter for general adult medical examination without abnormal findings: Secondary | ICD-10-CM | POA: Diagnosis not present

## 2016-10-29 DIAGNOSIS — E785 Hyperlipidemia, unspecified: Secondary | ICD-10-CM

## 2016-10-29 DIAGNOSIS — I10 Essential (primary) hypertension: Secondary | ICD-10-CM

## 2016-10-29 DIAGNOSIS — N183 Chronic kidney disease, stage 3 unspecified: Secondary | ICD-10-CM

## 2016-10-29 DIAGNOSIS — E213 Hyperparathyroidism, unspecified: Secondary | ICD-10-CM

## 2016-10-29 LAB — CBC
HEMATOCRIT: 36.2 % (ref 36.0–46.0)
HEMOGLOBIN: 12.2 g/dL (ref 12.0–15.0)
MCHC: 33.8 g/dL (ref 30.0–36.0)
MCV: 95.7 fl (ref 78.0–100.0)
Platelets: 198 10*3/uL (ref 150.0–400.0)
RBC: 3.78 Mil/uL — ABNORMAL LOW (ref 3.87–5.11)
RDW: 13.9 % (ref 11.5–15.5)
WBC: 8.1 10*3/uL (ref 4.0–10.5)

## 2016-10-29 LAB — BASIC METABOLIC PANEL
BUN: 36 mg/dL — ABNORMAL HIGH (ref 6–23)
CALCIUM: 10.6 mg/dL — AB (ref 8.4–10.5)
CO2: 28 mEq/L (ref 19–32)
CREATININE: 1.83 mg/dL — AB (ref 0.40–1.20)
Chloride: 101 mEq/L (ref 96–112)
GFR: 34.11 mL/min — AB (ref >60.00)
Glucose, Bld: 149 mg/dL — ABNORMAL HIGH (ref 70–99)
Potassium: 4.4 mEq/L (ref 3.5–5.1)
Sodium: 136 mEq/L (ref 135–145)

## 2016-10-29 LAB — HEMOGLOBIN A1C: HEMOGLOBIN A1C: 7.6 % — AB (ref 4.6–6.5)
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
## 2016-10-29 NOTE — Patient Instructions (Addendum)
Bonnie Chavez , Thank you for taking time to come for your Medicare Wellness Visit. I appreciate your ongoing commitment to your health goals. Please review the following plan we discussed and let me know if I can assist you in the future.   To make an apt to check hearing   Diabetes and weight loss; [facility]  Phone: [phone]  [facility]; [phone] [address]on [place]; [phone] Get resource to get information on any and all community programs for Seniors   De[facility] of Hearing [facility] Services - can assist with hearing aid x 1  No reviews  [facility]  [address] [place] [address]  [phone]   These are the goals we discussed: Goals    . patient           Eat right; stay healthy Fat free or low fat dairy products Fish high in omega-3 acids ( salmon, tuna, trout) Fruits, such as apples, bananas, oranges, pears, prunes Legumes, such as kidney beans, lentils, checkpeas, black-eyed peas and lima beans Vegetables; broccoli, cabbage, carrots Whole grains;   Plant fats are better; decrease "white" foods as pasta, rice, bread and desserts, sugar; Avoid red meat (limiting) palm and coconut oils; sugary foods and beverages  Two nutrients that raise blood chol levels are saturated fats and trans fat; in hydrogenated oils and fats, as stick margarine, baked goods (cookes, cakes, pies, crackers; frosting; and coffee creamers;   Some Fats lower cholesterol: Monounsaturated and polyunsaturated  Avocados Corn, sunflower, and soybean oils Nuts and seeds, such as walnuts Olive, canola, peanut, safflower, and sesame oils Peanut butter Salmon and trout Tofu          This is a list of the screening recommended for you and due dates:  Health Maintenance  Topic Date Due  . Complete foot exam   10/27/2016  . Shingles Vaccine  02/27/2025*  . Tetanus Vaccine  02/27/2025*  . Hemoglobin A1C  01/02/2017  . Eye exam for diabetics   01/30/2017  . Flu Shot  Addressed  . DEXA scan (bone density measurement)  Completed  . Pneumonia vaccines  Completed  *Topic was postponed. The date shown is not the original due date.    '    Fall Prevention in the Home Introduction Falls can cause injuries. They can happen to people of all ages. There are many things you can do to make your home safe and to help prevent falls. What can I do on the outside of my home?  Regularly fix the edges of walkways and driveways and fix any cracks.  Remove anything that might make you trip as you walk through a door, such as a raised step or threshold.  Trim any bushes or trees on the path to your home.  Use bright outdoor lighting.  Clear any walking paths of anything that might make someone trip, such as rocks or tools.  Regularly check to see if handrails are loose or broken. Make sure that both sides of any steps have handrails.  Any raised decks and porches should have guardrails on the edges.  Have any leaves, snow, or ice cleared regularly.  Use sand or salt on walking paths during winter.  Clean up any spills in your garage right away. This includes oil or grease spills. What can I do in the bathroom?  Use night lights.  Install grab bars by the toilet and in the tub and shower. Do not use towel  bars as grab bars.  Use non-skid mats or decals in the tub or shower.  If you need to sit down in the shower, use a plastic, non-slip stool.  Keep the floor dry. Clean up any water that spills on the floor as soon as it happens.  Remove soap buildup in the tub or shower regularly.  Attach bath mats securely with double-sided non-slip rug tape.  Do not have throw rugs and other things on the floor that can make you trip. What can I do in the bedroom?  Use night lights.  Make sure that you have a light by your bed that is easy to reach.  Do not use any sheets or blankets that are too big for your bed. They should not  hang down onto the floor.  Have a firm chair that has side arms. You can use this for support while you get dressed.  Do not have throw rugs and other things on the floor that can make you trip. What can I do in the kitchen?  Clean up any spills right away.  Avoid walking on wet floors.  Keep items that you use a lot in easy-to-reach places.  If you need to reach something above you, use a strong step stool that has a grab bar.  Keep electrical cords out of the way.  Do not use floor polish or wax that makes floors slippery. If you must use wax, use non-skid floor wax.  Do not have throw rugs and other things on the floor that can make you trip. What can I do with my stairs?  Do not leave any items on the stairs.  Make sure that there are handrails on both sides of the stairs and use them. Fix handrails that are broken or loose. Make sure that handrails are as long as the stairways.  Check any carpeting to make sure that it is firmly attached to the stairs. Fix any carpet that is loose or worn.  Avoid having throw rugs at the top or bottom of the stairs. If you do have throw rugs, attach them to the floor with carpet tape.  Make sure that you have a light switch at the top of the stairs and the bottom of the stairs. If you do not have them, ask someone to add them for you. What else can I do to help prevent falls?  Wear shoes that:  Do not have high heels.  Have rubber bottoms.  Are comfortable and fit you well.  Are closed at the toe. Do not wear sandals.  If you use a stepladder:  Make sure that it is fully opened. Do not climb a closed stepladder.  Make sure that both sides of the stepladder are locked into place.  Ask someone to hold it for you, if possible.  Clearly mark and make sure that you can see:  Any grab bars or handrails.  First and last steps.  Where the edge of each step is.  Use tools that help you move around (mobility aids) if they are  needed. These include:  Canes.  Walkers.  Scooters.  Crutches.  Turn on the lights when you go into a dark area. Replace any light bulbs as soon as they burn out.  Set up your furniture so you have a clear path. Avoid moving your furniture around.  If any of your floors are uneven, fix them.  If there are any pets around you, be aware of where they  are.  Review your medicines with your doctor. Some medicines can make you feel dizzy. This can increase your chance of falling. Ask your doctor what other things that you can do to help prevent falls. This information is not intended to replace advice given to you by your health care provider. Make sure you discuss any questions you have with your health care provider. Document Released: 09/05/2009 Document Revised: 04/16/2016 Document Reviewed: 12/14/2014  2017 Elsevier  Health Maintenance, Female Introduction Adopting a healthy lifestyle and getting preventive care can go a long way to promote health and wellness. Talk with your health care provider about what schedule of regular examinations is right for you. This is a good chance for you to check in with your provider about disease prevention and staying healthy. In between checkups, there are plenty of things you can do on your own. Experts have done a lot of research about which lifestyle changes and preventive measures are most likely to keep you healthy. Ask your health care provider for more information. Weight and diet Eat a healthy diet  Be sure to include plenty of vegetables, fruits, low-fat dairy products, and lean protein.  Do not eat a lot of foods high in solid fats, added sugars, or salt.  Get regular exercise. This is one of the most important things you can do for your health.  Most adults should exercise for at least 150 minutes each week. The exercise should increase your heart rate and make you sweat (moderate-intensity exercise).  Most adults should also do  strengthening exercises at least twice a week. This is in addition to the moderate-intensity exercise. Maintain a healthy weight  Body mass index (BMI) is a measurement that can be used to identify possible weight problems. It estimates body fat based on height and weight. Your health care provider can help determine your BMI and help you achieve or maintain a healthy weight.  For females 68 years of age and older:  A BMI below 18.5 is considered underweight.  A BMI of 18.5 to 24.9 is normal.  A BMI of 25 to 29.9 is considered overweight.  A BMI of 30 and above is considered obese. Watch levels of cholesterol and blood lipids  You should start having your blood tested for lipids and cholesterol at 80 years of age, then have this test every 5 years.  You may need to have your cholesterol levels checked more often if:  Your lipid or cholesterol levels are high.  You are older than 80 years of age.  You are at high risk for heart disease. Cancer screening Lung Cancer  Lung cancer screening is recommended for adults 39-17 years old who are at high risk for lung cancer because of a history of smoking.  A yearly low-dose CT scan of the lungs is recommended for people who:  Currently smoke.  Have quit within the past 15 years.  Have at least a 30-pack-year history of smoking. A pack year is smoking an average of one pack of cigarettes a day for 1 year.  Yearly screening should continue until it has been 15 years since you quit.  Yearly screening should stop if you develop a health problem that would prevent you from having lung cancer treatment. Breast Cancer  Practice breast self-awareness. This means understanding how your breasts normally appear and feel.  It also means doing regular breast self-exams. Let your health care provider know about any changes, no matter how small.  If you are  in your 91s or 30s, you should have a clinical breast exam (CBE) by a health care  provider every 1-3 years as part of a regular health exam.  If you are 57 or older, have a CBE every year. Also consider having a breast X-ray (mammogram) every year.  If you have a family history of breast cancer, talk to your health care provider about genetic screening.  If you are at high risk for breast cancer, talk to your health care provider about having an MRI and a mammogram every year.  Breast cancer gene (BRCA) assessment is recommended for women who have family members with BRCA-related cancers. BRCA-related cancers include:  Breast.  Ovarian.  Tubal.  Peritoneal cancers.  Results of the assessment will determine the need for genetic counseling and BRCA1 and BRCA2 testing. Cervical Cancer  Your health care provider may recommend that you be screened regularly for cancer of the pelvic organs (ovaries, uterus, and vagina). This screening involves a pelvic examination, including checking for microscopic changes to the surface of your cervix (Pap test). You may be encouraged to have this screening done every 3 years, beginning at age 57.  For women ages 37-65, health care providers may recommend pelvic exams and Pap testing every 3 years, or they may recommend the Pap and pelvic exam, combined with testing for human papilloma virus (HPV), every 5 years. Some types of HPV increase your risk of cervical cancer. Testing for HPV may also be done on women of any age with unclear Pap test results.  Other health care providers may not recommend any screening for nonpregnant women who are considered low risk for pelvic cancer and who do not have symptoms. Ask your health care provider if a screening pelvic exam is right for you.  If you have had past treatment for cervical cancer or a condition that could lead to cancer, you need Pap tests and screening for cancer for at least 20 years after your treatment. If Pap tests have been discontinued, your risk factors (such as having a new sexual  partner) need to be reassessed to determine if screening should resume. Some women have medical problems that increase the chance of getting cervical cancer. In these cases, your health care provider may recommend more frequent screening and Pap tests. Colorectal Cancer  This type of cancer can be detected and often prevented.  Routine colorectal cancer screening usually begins at 80 years of age and continues through 80 years of age.  Your health care provider may recommend screening at an earlier age if you have risk factors for colon cancer.  Your health care provider may also recommend using home test kits to check for hidden blood in the stool.  A small camera at the end of a tube can be used to examine your colon directly (sigmoidoscopy or colonoscopy). This is done to check for the earliest forms of colorectal cancer.  Routine screening usually begins at age 2.  Direct examination of the colon should be repeated every 5-10 years through 80 years of age. However, you may need to be screened more often if early forms of precancerous polyps or small growths are found. Skin Cancer  Check your skin from head to toe regularly.  Tell your health care provider about any new moles or changes in moles, especially if there is a change in a mole's shape or color.  Also tell your health care provider if you have a mole that is larger than the size of  a pencil eraser.  Always use sunscreen. Apply sunscreen liberally and repeatedly throughout the day.  Protect yourself by wearing long sleeves, pants, a wide-brimmed hat, and sunglasses whenever you are outside. Heart disease, diabetes, and high blood pressure  High blood pressure causes heart disease and increases the risk of stroke. High blood pressure is more likely to develop in:  People who have blood pressure in the high end of the normal range (130-139/85-89 mm Hg).  People who are overweight or obese.  People who are African  American.  If you are 66-54 years of age, have your blood pressure checked every 3-5 years. If you are 4 years of age or older, have your blood pressure checked every year. You should have your blood pressure measured twice-once when you are at a hospital or clinic, and once when you are not at a hospital or clinic. Record the average of the two measurements. To check your blood pressure when you are not at a hospital or clinic, you can use:  An automated blood pressure machine at a pharmacy.  A home blood pressure monitor.  If you are between 23 years and 109 years old, ask your health care provider if you should take aspirin to prevent strokes.  Have regular diabetes screenings. This involves taking a blood sample to check your fasting blood sugar level.  If you are at a normal weight and have a low risk for diabetes, have this test once every three years after 80 years of age.  If you are overweight and have a high risk for diabetes, consider being tested at a younger age or more often. Preventing infection Hepatitis B  If you have a higher risk for hepatitis B, you should be screened for this virus. You are considered at high risk for hepatitis B if:  You were born in a country where hepatitis B is common. Ask your health care provider which countries are considered high risk.  Your parents were born in a high-risk country, and you have not been immunized against hepatitis B (hepatitis B vaccine).  You have HIV or AIDS.  You use needles to inject street drugs.  You live with someone who has hepatitis B.  You have had sex with someone who has hepatitis B.  You get hemodialysis treatment.  You take certain medicines for conditions, including cancer, organ transplantation, and autoimmune conditions. Hepatitis C  Blood testing is recommended for:  Everyone born from [birth year] through 1963.  Anyone with known risk factors for hepatitis C. Sexually transmitted infections  (STIs)  You should be screened for sexually transmitted infections (STIs) including gonorrhea and chlamydia if:  You are sexually active and are younger than 80 years of age.  You are older than 80 years of age and your health care provider tells you that you are at risk for this type of infection.  Your sexual activity has changed since you were last screened and you are at an increased risk for chlamydia or gonorrhea. Ask your health care provider if you are at risk.  If you do not have HIV, but are at risk, it may be recommended that you take a prescription medicine daily to prevent HIV infection. This is called pre-exposure prophylaxis (PrEP). You are considered at risk if:  You are sexually active and do not regularly use condoms or know the HIV status of your partner(s).  You take drugs by injection.  You are sexually active with a partner who has HIV. Talk with  your health care provider about whether you are at high risk of being infected with HIV. If you choose to begin PrEP, you should first be tested for HIV. You should then be tested every 3 months for as long as you are taking PrEP. Pregnancy  If you are premenopausal and you may become pregnant, ask your health care provider about preconception counseling.  If you may become pregnant, take 400 to 800 micrograms (mcg) of folic acid every day.  If you want to prevent pregnancy, talk to your health care provider about birth control (contraception). Osteoporosis and menopause  Osteoporosis is a disease in which the bones lose minerals and strength with aging. This can result in serious bone fractures. Your risk for osteoporosis can be identified using a bone density scan.  If you are 41 years of age or older, or if you are at risk for osteoporosis and fractures, ask your health care provider if you should be screened.  Ask your health care provider whether you should take a calcium or vitamin D supplement to lower your risk  for osteoporosis.  Menopause may have certain physical symptoms and risks.  Hormone replacement therapy may reduce some of these symptoms and risks. Talk to your health care provider about whether hormone replacement therapy is right for you. Follow these instructions at home:  Schedule regular health, dental, and eye exams.  Stay current with your immunizations.  Do not use any tobacco products including cigarettes, chewing tobacco, or electronic cigarettes.  If you are pregnant, do not drink alcohol.  If you are breastfeeding, limit how much and how often you drink alcohol.  Limit alcohol intake to no more than 1 drink per day for nonpregnant women. One drink equals 12 ounces of beer, 5 ounces of wine, or 1 ounces of hard liquor.  Do not use street drugs.  Do not share needles.  Ask your health care provider for help if you need support or information about quitting drugs.  Tell your health care provider if you often feel depressed.  Tell your health care provider if you have ever been abused or do not feel safe at home. This information is not intended to replace advice given to you by your health care provider. Make sure you discuss any questions you have with your health care provider. Document Released: 05/25/2011 Document Revised: 04/16/2016 Document Reviewed: 08/13/2015  2017 Elsevier   Hearing Loss Introduction Hearing loss is a partial or total loss of the ability to hear. This can be temporary or permanent, and it can happen in one or both ears. Hearing loss may be referred to as deafness. Medical care is necessary to treat hearing loss properly and to prevent the condition from getting worse. Your hearing may partially or completely come back, depending on what caused your hearing loss and how severe it is. In some cases, hearing loss is permanent. What are the causes? Common causes of hearing loss include:  Too much wax in the ear canal.  Infection of the ear  canal or middle ear.  Fluid in the middle ear.  Injury to the ear or surrounding area.  An object stuck in the ear.  Prolonged exposure to loud sounds, such as music. Less common causes of hearing loss include:  Tumors in the ear.  Viral or bacterial infections, such as meningitis.  A hole in the eardrum (perforated eardrum).  Problems with the hearing nerve that sends signals between the brain and the ear.  Certain medicines.  What are the signs or symptoms? Symptoms of this condition may include:  Difficulty telling the difference between sounds.  Difficulty following a conversation when there is background noise.  Lack of response to sounds in your environment. This may be most noticeable when you do not respond to startling sounds.  Needing to turn up the volume on the television, radio, etc.  Ringing in the ears.  Dizziness.  Pain in the ears. How is this diagnosed? This condition is diagnosed based on a physical exam and a hearing test (audiometry). The audiometry test will be performed by a hearing specialist (audiologist). You may also be referred to an ear, nose, and throat (ENT) specialist (otolaryngologist). How is this treated? Treatment for recent onset of hearing loss may include:  Ear wax removal.  Being prescribed medicines to prevent infection (antibiotics).  Being prescribed medicines to reduce inflammation (corticosteroids). Follow these instructions at home:  If you were prescribed an antibiotic medicine, take it as told by your health care provider. Do not stop taking the antibiotic even if you start to feel better.  Take over-the-counter and prescription medicines only as told by your health care provider.  Avoid loud noises.  Return to your normal activities as told by your health care provider. Ask your health care provider what activities are safe for you.  Keep all follow-up visits as told by your health care provider. This is  important. Contact a health care provider if:  You feel dizzy.  You develop new symptoms.  You vomit or feel nauseous.  You have a fever. Get help right away if:  You develop sudden changes in your vision.  You have severe ear pain.  You have new or increased weakness.  You have a severe headache. This information is not intended to replace advice given to you by your health care provider. Make sure you discuss any questions you have with your health care provider. Document Released: 11/09/2005 Document Revised: 04/16/2016 Document Reviewed: 03/27/2015  2017 Elsevier

## 2016-10-29 NOTE — Progress Notes (Signed)
Datra Clary R., DO  

## 2016-10-29 NOTE — Progress Notes (Signed)
Pre visit review using our clinic review tool, if applicable. No additional management support is needed unless otherwise documented below in the visit note. 

## 2016-10-29 NOTE — Patient Instructions (Signed)
BEFORE YOU LEAVE: -follow up: 3 months   We have ordered labs or studies at this visit. It can take up to 1-2 weeks for results and processing. IF results require follow up or explanation, we will call you with instructions. Clinically stable results will be released to your [facility]. If you have not heard from [place] or cannot find your results in [facility] in 2 weeks please contact our office at [phone].  If you are not yet signed up for [facility], please consider signing up.

## 2016-11-02 ENCOUNTER — Telehealth: Payer: Self-pay | Admitting: Family Medicine

## 2016-11-02 ENCOUNTER — Encounter: Payer: Self-pay | Admitting: *Deleted

## 2016-11-02 NOTE — Telephone Encounter (Signed)
Pt is calling back for lab results.  Asked if you could call back at home number.

## 2016-11-02 NOTE — Telephone Encounter (Signed)
See results note. 

## 2016-11-04 DIAGNOSIS — E785 Hyperlipidemia, unspecified: Secondary | ICD-10-CM | POA: Diagnosis not present

## 2016-11-04 DIAGNOSIS — Z6833 Body mass index (BMI) 33.0-33.9, adult: Secondary | ICD-10-CM | POA: Diagnosis not present

## 2016-11-04 DIAGNOSIS — N183 Chronic kidney disease, stage 3 (moderate): Secondary | ICD-10-CM | POA: Diagnosis not present

## 2016-11-04 DIAGNOSIS — D631 Anemia in chronic kidney disease: Secondary | ICD-10-CM | POA: Diagnosis not present

## 2016-11-04 DIAGNOSIS — E039 Hypothyroidism, unspecified: Secondary | ICD-10-CM | POA: Diagnosis not present

## 2016-11-04 DIAGNOSIS — E1129 Type 2 diabetes mellitus with other diabetic kidney complication: Secondary | ICD-10-CM | POA: Diagnosis not present

## 2016-11-04 DIAGNOSIS — M1990 Unspecified osteoarthritis, unspecified site: Secondary | ICD-10-CM | POA: Diagnosis not present

## 2016-11-04 DIAGNOSIS — I129 Hypertensive chronic kidney disease with stage 1 through stage 4 chronic kidney disease, or unspecified chronic kidney disease: Secondary | ICD-10-CM | POA: Diagnosis not present

## 2016-11-04 DIAGNOSIS — N2581 Secondary hyperparathyroidism of renal origin: Secondary | ICD-10-CM | POA: Diagnosis not present

## 2017-01-19 DIAGNOSIS — H903 Sensorineural hearing loss, bilateral: Secondary | ICD-10-CM | POA: Insufficient documentation

## 2017-01-20 ENCOUNTER — Other Ambulatory Visit: Payer: Self-pay | Admitting: *Deleted

## 2017-01-20 MED ORDER — ROSUVASTATIN CALCIUM 40 MG PO TABS: 40.0000 mg | ORAL_TABLET | Freq: Every day | ORAL | 3 refills | Status: DC

## 2017-01-20 NOTE — Telephone Encounter (Signed)
E sent to pharmacy  Request change to 90 day supply

## 2017-01-25 ENCOUNTER — Other Ambulatory Visit: Payer: Self-pay | Admitting: Cardiovascular Disease

## 2017-01-25 ENCOUNTER — Other Ambulatory Visit: Payer: Self-pay

## 2017-01-25 MED ORDER — NEBIVOLOL HCL 5 MG PO TABS: 5.0000 mg | ORAL_TABLET | Freq: Two times a day (BID) | ORAL | 0 refills | Status: DC

## 2017-01-25 NOTE — Telephone Encounter (Signed)
Medication Detail    Disp Refills Start End   nebivolol (BYSTOLIC) 5 MG tablet [id number] tablet 0 01/25/2017    Sig - Route: Take 1 tablet (5 mg total) by mouth 2 (two) times daily. - Oral   E-Prescribing Status: Receipt confirmed by pharmacy (01/25/2017 2:34 PM EST)   Pharmacy   [id number] - [place], [employer] [employer] - [address]

## 2017-01-25 NOTE — Telephone Encounter (Signed)
New Message     *STAT* If patient is at the pharmacy, call can be transferred to refill team.   1. Which medications need to be refilled? (please list name of each medication and dose if known) bystolic 5 mg 2x day  2. Which pharmacy/location (including street and city if local pharmacy) is medication to be sent to? [facility] [address] [place]  3. Do they need a 30 day or 90 day supply? [place]

## 2017-01-27 ENCOUNTER — Telehealth: Payer: Self-pay | Admitting: Cardiovascular Disease

## 2017-01-27 ENCOUNTER — Telehealth: Payer: Self-pay | Admitting: *Deleted

## 2017-01-27 NOTE — Progress Notes (Signed)
HPI:  Bonnie Chavez is a pleasant 83 y.o. here for follow up. Chronic medical problems summarized below were reviewed for changes and stability and were updated as needed below. These issues and their treatment remain stable for the most part. Doing well.  Reports she did talk to her endocrinologist after her last set of labs and her endocrinologist advised her not to change her treatment. Her fasting blood sugars on average range around 150s to 160s. Her postprandials also range and this area. No low blood sugars takes 14 units of Humulin N insulin nightly. Exercising on a regular basis. Trying to eat healthy.   She does continue to have some problems with sleep. She thinks that the Mobic that her foot doctor gave her may be causing her sleep problems as they seem worse when she takes this. She will be doing a sleep study soon with her specialist. She has enrolled in the stop smoking program through [facility] She has cut back to 2 cigarettes per day. Her motivation to crit is her granddaughter would like for her to quit. She would like to quit by her granddaughters birthday in May. Denies CP, SOB, DOE, treatment intolerance or new symptoms. Due for labs  CdCHF/HLD/aortic valve sclerosis/Obesity: -sees cardiologist (Dr. Claiborne Billings) -meds:amlodipine 10, crestor 40, lasix 20, lisinopril 10, nebivolol 5, asa -exercises 3x per week at [employer] in water aerobics  DM, Hyperparathyroidism: -sees endocriologist (Dr. Chalmers Cater) -meds: tradjenta 5; insulin - humulin n 14 unitys -eye exam with [facility] 02/2016  CKD/vit D def: -sees nephrology ([name])  HRT for Hot Flashes, Uterine Polyps: -sees gynecologist ([name])  DDD/Lumbar radiculopathy/CTS: -seeing [facility]  ROS: See pertinent positives and negatives per HPI.  Past Medical History:  Diagnosis Date  . Aortic valve sclerosis   . Carpal tunnel syndrome   . Chicken pox   . Chronic diastolic CHF (congestive heart failure)  ([place])   . Chronic low back pain 02/27/2016   -DDD -seeing [facility]   . Diabetes mellitus    type 2, sees Dr. Chalmers Cater  . Goiter    Dr. Chalmers Cater  . Heart murmur   . History of nuclear stress test 06/2010   dipyridamole; normal pattern of perfusion; ekg negative for ischemia; low risk scan   . Hot flashes   . Hyperlipidemia   . Hyperparathyroidism ([place]) 02/27/2016   -sees endocrinologist, Dr. Chalmers Cater, has seen surgeon as well   . Hypertension   . Migraine    no longer has migraines (80's)  . Mitral valve prolapse   . Obesity 02/27/2016  . Osteoarthritis    DDD, sees [facility]  . PMB (postmenopausal bleeding) 02/28/2009   Resolved  . Renal insufficiency    [name]   . Vaginal atrophy   . Vitamin D deficiency     Past Surgical History:  Procedure Laterality Date  . [place] &1976  . CHOLECYSTECTOMY  1991  . DILATATION & CURRETTAGE/HYSTEROSCOPY WITH RESECTOCOPE N/A 05/15/2013   Procedure: [place];  Surgeon: Eldred Manges, MD;  Location: [facility];  Service: Gynecology;  Laterality: N/A;  . DILATION AND CURETTAGE OF UTERUS  1975  . [place]  . KNEE SURGERY  2000 2011   right knee replacement   . REPLACEMENT TOTAL KNEE  2000   left  . SHOULDER SURGERY  2004 2010   . TRANSTHORACIC ECHOCARDIOGRAM  07/2010   EF=>55%; LA mildly dilated; trace MR/TR;  . TUBAL  LIGATION  1976    Family History  Problem Relation Age of Onset  . Hypertension Mother   . Diabetes Mother   . Hyperlipidemia Mother   . Heart attack Mother   . Heart disease Father   . Heart attack Father   . Stroke Brother   . Heart disease Brother   . Diabetes Brother   . Uterine cancer Maternal Grandmother     Social History   Social History  . Marital status: Divorced    Spouse name: N/A  . Number of children: 2  . Years of education: N/A   Social History Main Topics  . Smoking status: Never Smoker  . Smokeless tobacco:  Never Used  . Alcohol use No  . Drug use: No  . Sexual activity: Not Asked   Other Topics Concern  . None   Social History Narrative   Work or School: none      Home Situation: lives alone, feels safe, son lives nearby      Spiritual Beliefs: Christian      Lifestyle: Y 3x per week, healthy diet           Current Outpatient Prescriptions:  .  amLODipine (NORVASC) 10 MG tablet, TAKE 1 TABLET BY MOUTH AT BEDTIME., Disp: 30 tablet, Rfl: 11 .  aspirin (ECOTRIN LOW STRENGTH) 81 MG EC tablet, Take 81 mg by mouth at bedtime. Swallow whole. , Disp: , Rfl:  .  BD PEN NEEDLE NANO U/F 32G X 4 MM MISC, See admin instructions., Disp: , Rfl: 4 .  Blood Glucose Monitoring Suppl (ONE TOUCH ULTRA MINI) W/DEVICE KIT, Use as directed, Disp: 1 each, Rfl: 0 .  estradiol (VIVELLE-DOT) 0.05 MG/24HR patch, Place 0.05 patches onto the skin 2 (two) times a week. Apply on Tuesday and Saturday., Disp: , Rfl: 12 .  furosemide (LASIX) 20 MG tablet, Take 40 mg by mouth daily., Disp: , Rfl:  .  gabapentin (NEURONTIN) 100 MG capsule, TAKE ONE CAPSULE BY MOUTH 3 TIMES A DAY AS NEEDED (Patient taking differently: Take one capsule by mouth at bedtime), Disp: 30 capsule, Rfl: 2 .  HUMULIN N KWIKPEN 100 UNIT/ML Kiwkpen, Inject 14 Units into the skin at bedtime. , Disp: , Rfl: 6 .  Lancets Misc. KIT, 1 Device by Does not apply route 2 (two) times daily., Disp: 100 each, Rfl: 3 .  lisinopril (PRINIVIL,ZESTRIL) 10 MG tablet, TAKE 1 TABLET BY MOUTH EVERY DAY (Patient taking differently: TAKE 1 TABLET BY MOUTH AT BEDTIME.), Disp: 30 tablet, Rfl: 11 .  nebivolol (BYSTOLIC) 5 MG tablet, Take 1 tablet (5 mg total) by mouth 2 (two) times daily., Disp: 180 tablet, Rfl: 0 .  ONE TOUCH ULTRA TEST test strip, USE TWICE A DAY AND AS NEEDED, Disp: 100 each, Rfl: 3 .  progesterone (PROMETRIUM) 200 MG capsule, Take 200 mg by mouth daily with supper., Disp: , Rfl:  .  pyridOXINE (VITAMIN B-6) 100 MG tablet, Take 200 mg by mouth daily.,  Disp: , Rfl:  .  rosuvastatin (CRESTOR) 40 MG tablet, Take 1 tablet (40 mg total) by mouth at bedtime., Disp: 90 tablet, Rfl: 3  EXAM:  Vitals:   01/28/17 0926  BP: (!) 120/58  Pulse: (!) 59  Temp: 98.1 F (36.7 C)    Body mass index is 38.75 kg/m.  GENERAL: vitals reviewed and listed above, alert, oriented, appears well hydrated and in no acute distress  HEENT: atraumatic, conjunttiva clear, no obvious abnormalities on inspection of external nose and  ears  NECK: no obvious masses on inspection  LUNGS: clear to auscultation bilaterally, no wheezes, rales or rhonchi, good air movement  CV: HRRR, no peripheral edema  MS: moves all extremities without noticeable abnormality  PSYCH: pleasant and cooperative, no obvious depression or anxiety  ASSESSMENT AND PLAN:  Discussed the following assessment and plan:  Type 2 diabetes mellitus with chronic kidney disease, with long-term current use of insulin, unspecified CKD stage (HCC) - Plan: Hemoglobin R6V  Chronic diastolic congestive heart failure (HCC)  Class 2 obesity due to excess calories with serious comorbidity and body mass index (BMI) of 38.0 to 38.9 in adult  Mild aortic stenosis  Hyperparathyroidism (HCC)  Aortic valve sclerosis  Chronic kidney disease, unspecified CKD stage - Plan: Basic metabolic panel  -Smoking cessation counseling for 3-5 minutes, discussed ways to help her to quit, she may try cinnamon gum and places cigarettes for her worst cravings, goal is to quit by her next appointment -Discussed treatment options for sleep, she has tried sleep aids in the past and did not respond well to them. Doubt that the benefits would outweigh the risk. She is going to get a sleep study and may try some cognitive behavioral therapy. She also is going to stop them aerobic. -Discussed potentially increasing her insulin a little. She will consider this after her labs results. -Decided to increase her statin extremity  milligrams, may need to consider adding ezetimibe. -Lifestyle recommendations -Patient advised to return or notify a doctor immediately if symptoms worsen or persist or new concerns arise.  Patient Instructions  BEFORE YOU LEAVE: -advanced directives -labs -follow up: 3-4 months  We have ordered labs or studies at this visit. It can take up to 1-2 weeks for results and processing. IF results require follow up or explanation, we will call you with instructions. Clinically stable results will be released to your [facility]. If you have not heard from [place] or cannot find your results in [facility] in 2 weeks please contact our office at [phone].  If you are not yet signed up for [facility], please consider signing up.  We recommend the following healthy lifestyle for LIFE: 1) Small portions.   Tip: eat off of a salad plate instead of a dinner plate.  Tip: It is ok to feel hungry after a meal of proper portion size.  Tip: if you need more or a snack choose fruits, veggies and/or a handful of nuts or seeds.  2) Eat a healthy clean diet.  * Tip: Avoid (less then 1 serving per week): processed foods, sweets, sweetened drinks, white starches (rice, flour, bread, potatoes, pasta, etc), red meat, fast foods, butter  *Tip: CHOOSE instead   * 5-9 servings per day of fresh or frozen fruits and vegetables (but not corn, potatoes, bananas, canned or dried fruit)   *nuts and seeds, beans   *olives and olive oil   *small portions of lean meats such as fish and white chicken    *small portions of whole grains  3)Get at least 150 minutes of sweaty aerobic exercise per week.  4)Reduce stress - consider counseling, meditation and relaxation to balance other aspects of your life.  WE NOW OFFER   [facility] FAST TRACK!!!  SAME DAY Appointments for ACUTE CARE  Such as: Sprains, Injuries, cuts, abrasions, rashes, muscle pain, joint pain, back pain Colds, flu, sore throats, headache, allergies,  cough, fever  Ear pain, sinus and eye infections Abdominal pain, nausea, vomiting, diarrhea, upset stomach Animal/insect bites  3 Easy Ways to Schedule: Walk-In Scheduling Call in scheduling Mychart Sign-up: [email or web address]           Colin Benton R., DO

## 2017-01-27 NOTE — Telephone Encounter (Signed)
Received call from [facility]-states patient needs a prior auth for bystolic 5mg  BID and patient is almost out of medication.    Advised I would route to primary RN to make aware.    # provided if needed [phone]

## 2017-01-27 NOTE — Telephone Encounter (Signed)
error 

## 2017-01-28 ENCOUNTER — Other Ambulatory Visit: Payer: Self-pay | Admitting: *Deleted

## 2017-01-28 ENCOUNTER — Ambulatory Visit (INDEPENDENT_AMBULATORY_CARE_PROVIDER_SITE_OTHER): Payer: Medicare Other | Admitting: Family Medicine

## 2017-01-28 ENCOUNTER — Encounter: Payer: Self-pay | Admitting: Family Medicine

## 2017-01-28 VITALS — BP 120/58 | HR 59 | Temp 98.1°F | Ht 60.0 in | Wt 198.4 lb

## 2017-01-28 DIAGNOSIS — E213 Hyperparathyroidism, unspecified: Secondary | ICD-10-CM

## 2017-01-28 DIAGNOSIS — Z794 Long term (current) use of insulin: Secondary | ICD-10-CM

## 2017-01-28 DIAGNOSIS — Z6838 Body mass index (BMI) 38.0-38.9, adult: Secondary | ICD-10-CM

## 2017-01-28 DIAGNOSIS — I5032 Chronic diastolic (congestive) heart failure: Secondary | ICD-10-CM

## 2017-01-28 DIAGNOSIS — I358 Other nonrheumatic aortic valve disorders: Secondary | ICD-10-CM

## 2017-01-28 DIAGNOSIS — E6609 Other obesity due to excess calories: Secondary | ICD-10-CM

## 2017-01-28 DIAGNOSIS — E1122 Type 2 diabetes mellitus with diabetic chronic kidney disease: Secondary | ICD-10-CM | POA: Diagnosis not present

## 2017-01-28 DIAGNOSIS — I350 Nonrheumatic aortic (valve) stenosis: Secondary | ICD-10-CM | POA: Diagnosis not present

## 2017-01-28 DIAGNOSIS — N189 Chronic kidney disease, unspecified: Secondary | ICD-10-CM | POA: Diagnosis not present

## 2017-01-28 DIAGNOSIS — IMO0001 Reserved for inherently not codable concepts without codable children: Secondary | ICD-10-CM

## 2017-01-28 LAB — BASIC METABOLIC PANEL
BUN: 38 mg/dL — ABNORMAL HIGH (ref 6–23)
CALCIUM: 10.7 mg/dL — AB (ref 8.4–10.5)
CO2: 27 mEq/L (ref 19–32)
Chloride: 104 mEq/L (ref 96–112)
Creatinine, Ser: 1.63 mg/dL — ABNORMAL HIGH (ref 0.40–1.20)
GFR: 38.96 mL/min — AB (ref >60.00)
Glucose, Bld: 133 mg/dL — ABNORMAL HIGH (ref 70–99)
Potassium: 4.3 mEq/L (ref 3.5–5.1)
Sodium: 137 mEq/L (ref 135–145)

## 2017-01-28 LAB — HEMOGLOBIN A1C: Hgb A1c MFr Bld: 8 % — ABNORMAL HIGH (ref 4.6–6.5)

## 2017-01-28 MED ORDER — NEBIVOLOL HCL 10 MG PO TABS: 5.0000 mg | ORAL_TABLET | Freq: Two times a day (BID) | ORAL | 3 refills | Status: DC

## 2017-01-28 MED ORDER — NEBIVOLOL HCL 10 MG PO TABS: 10.0000 mg | ORAL_TABLET | Freq: Every day | ORAL | 6 refills | Status: DC

## 2017-01-28 NOTE — Patient Instructions (Signed)
BEFORE YOU LEAVE: -advanced directives -labs -follow up: 3-4 months  We have ordered labs or studies at this visit. It can take up to 1-2 weeks for results and processing. IF results require follow up or explanation, we will call you with instructions. Clinically stable results will be released to your [facility]. If you have not heard from [place] or cannot find your results in [facility] in 2 weeks please contact our office at [phone].  If you are not yet signed up for [facility], please consider signing up.  We recommend the following healthy lifestyle for LIFE: 1) Small portions.   Tip: eat off of a salad plate instead of a dinner plate.  Tip: It is ok to feel hungry after a meal of proper portion size.  Tip: if you need more or a snack choose fruits, veggies and/or a handful of nuts or seeds.  2) Eat a healthy clean diet.  * Tip: Avoid (less then 1 serving per week): processed foods, sweets, sweetened drinks, white starches (rice, flour, bread, potatoes, pasta, etc), red meat, fast foods, butter  *Tip: CHOOSE instead   * 5-9 servings per day of fresh or frozen fruits and vegetables (but not corn, potatoes, bananas, canned or dried fruit)   *nuts and seeds, beans   *olives and olive oil   *small portions of lean meats such as fish and white chicken    *small portions of whole grains  3)Get at least 150 minutes of sweaty aerobic exercise per week.  4)Reduce stress - consider counseling, meditation and relaxation to balance other aspects of your life.  WE NOW OFFER   [facility] FAST TRACK!!!  SAME DAY Appointments for ACUTE CARE  Such as: Sprains, Injuries, cuts, abrasions, rashes, muscle pain, joint pain, back pain Colds, flu, sore throats, headache, allergies, cough, fever  Ear pain, sinus and eye infections Abdominal pain, nausea, vomiting, diarrhea, upset stomach Animal/insect bites  3 Easy Ways to Schedule: Walk-In Scheduling Call in scheduling Mychart Sign-up:  [email or web address]

## 2017-01-28 NOTE — Telephone Encounter (Signed)
Bystolic 10 mg sent in to pharmacy per Dr Claiborne Billings.  Insurance would not cover Bystolic 5 mg twice a day.

## 2017-01-28 NOTE — Progress Notes (Signed)
Pre visit review using our clinic review tool, if applicable. No additional management support is needed unless otherwise documented below in the visit note. 

## 2017-01-28 NOTE — Telephone Encounter (Signed)
Heidi ( [facility] ) is calling to see if they can change the strength for Bystolic 5mg   Insurance will not pay for that , but they will pay for 10mg  . Please call

## 2017-01-28 NOTE — Telephone Encounter (Signed)
New rx sent to pharmacy for 10 mg 0.5 tab BID

## 2017-02-01 ENCOUNTER — Encounter: Payer: Self-pay | Admitting: *Deleted

## 2017-02-02 LAB — HM DIABETES EYE EXAM

## 2017-02-12 ENCOUNTER — Encounter: Payer: Self-pay | Admitting: Family Medicine

## 2017-03-10 ENCOUNTER — Other Ambulatory Visit: Payer: Self-pay | Admitting: Cardiovascular Disease

## 2017-03-17 ENCOUNTER — Other Ambulatory Visit: Payer: Self-pay | Admitting: Cardiovascular Disease

## 2017-03-22 ENCOUNTER — Encounter: Payer: Self-pay | Admitting: Family Medicine

## 2017-04-01 ENCOUNTER — Other Ambulatory Visit: Payer: Self-pay | Admitting: Obstetrics and Gynecology

## 2017-04-02 ENCOUNTER — Encounter: Payer: Self-pay | Admitting: Family Medicine

## 2017-04-08 ENCOUNTER — Ambulatory Visit (INDEPENDENT_AMBULATORY_CARE_PROVIDER_SITE_OTHER): Payer: Medicare Other | Admitting: Cardiovascular Disease

## 2017-04-08 ENCOUNTER — Encounter: Payer: Self-pay | Admitting: Cardiovascular Disease

## 2017-04-08 ENCOUNTER — Encounter (INDEPENDENT_AMBULATORY_CARE_PROVIDER_SITE_OTHER): Payer: Self-pay

## 2017-04-08 VITALS — BP 140/52 | HR 60 | Ht 64.5 in | Wt 197.0 lb

## 2017-04-08 DIAGNOSIS — I350 Nonrheumatic aortic (valve) stenosis: Secondary | ICD-10-CM | POA: Diagnosis not present

## 2017-04-08 DIAGNOSIS — I10 Essential (primary) hypertension: Secondary | ICD-10-CM | POA: Diagnosis not present

## 2017-04-08 DIAGNOSIS — N183 Chronic kidney disease, stage 3 unspecified: Secondary | ICD-10-CM

## 2017-04-08 DIAGNOSIS — I440 Atrioventricular block, first degree: Secondary | ICD-10-CM

## 2017-04-08 NOTE — Patient Instructions (Signed)
Your physician has requested that you have an echocardiogram in September 2018. Echocardiography is a painless test that uses sound waves to create images of your heart. It provides your doctor with information about the size and shape of your heart and how well your heart's chambers and valves are working. This procedure takes approximately one hour. There are no restrictions for this procedure.  Your physician recommends that you schedule a follow-up appointment in: September with Dr Claiborne Billings.

## 2017-04-09 NOTE — Progress Notes (Signed)
Patient ID: Bonnie Chavez, female   DOB: Dec 01, 1933, 83 y.o.   MRN: [id number]      HPI: Bonnie Chavez is a 83 y.o. female presents to the office today for a 60 month cardiology evaluation.  Bonnie Chavez has a history of hypertension with documented grade 1 diastolic dysfunction, hyperlipidemia, type 2 diabetes mellitus with renal insufficiency, as well as aortic valve sclerosis without stenosis. An echo Doppler study in 2011 showed an EF of 55%.  In 2011 a nuclear perfusion scan showed normal perfusion and function.  She has a history of a goiter for which she sees [name], [name] for her renal insufficiency, [name] for uterine polyps and fibroids.  She has a history of hyperparathyroidism and saw [name]; she has not had surgery in his undergoing continued surveillance of her calcium levels.  She underwent a five-year follow-up echo Doppler study on 08/23/2015.  This continued to show normal systolic function with mild LVH and grade 1 diastolic dysfunction.  Now had a 10 mm mean gradient and 22 mm peak gradient across her moderately calcified aortic valve with a valve area of 1.63 cm.    Since I last saw her she did denies chest pain.  She has noticed some occasional mild ankle swelling.  She admits to decreased energy and fatigue.  She denies PND, orthopnea.  She denies palpitations.  There is no presyncope or syncope.  She has a long-standing history of diabetes mellitus  and sees [name].  She sees [name] for renal insufficiency.  I reviewed her recent blood work from his office 03/09/2017 which showed her creatinine had improved to 1.5.  In March 2018 Cr was 1.63 and in December 2017 Cr was 1.83.   She has been on Bystolic 5 mg twice a day, lisinopril 10 mg, furosemide 40 mg daily, in addition to amlodipine, 10 mg. for hypertension and leg swelling.  She is on Humulin insulin for her diabetes.  She has been on Crestor 40 mg for hyperlipidemia.  She presents for  evaluation.   Past Medical History:  Diagnosis Date  . Aortic valve sclerosis   . Carpal tunnel syndrome   . Chicken pox   . Chronic diastolic CHF (congestive heart failure) ([place])   . Chronic low back pain 02/27/2016   -DDD -seeing [facility]   . Diabetes mellitus    type 2, sees Dr. Chalmers Cater  . Goiter    Dr. Chalmers Cater  . Heart murmur   . History of nuclear stress test 06/2010   dipyridamole; normal pattern of perfusion; ekg negative for ischemia; low risk scan   . Hot flashes   . Hyperlipidemia   . Hyperparathyroidism ([place]) 02/27/2016   -sees endocrinologist, Dr. Chalmers Cater, has seen surgeon as well   . Hypertension   . Migraine    no longer has migraines (80's)  . Mitral valve prolapse   . Obesity 02/27/2016  . Osteoarthritis    DDD, sees [facility]  . PMB (postmenopausal bleeding) 02/28/2009   Resolved  . Renal insufficiency    [name]   . Vaginal atrophy   . Vitamin D deficiency     Past Surgical History:  Procedure Laterality Date  . [place] &[place]  . CHOLECYSTECTOMY  1991  . DILATATION & CURRETTAGE/HYSTEROSCOPY WITH RESECTOCOPE N/A 05/15/2013   Procedure: [place];  Surgeon: Eldred Manges, MD;  Location: [facility];  Service: Gynecology;  Laterality: N/A;  . DILATION AND  CURETTAGE OF UTERUS  1975  . [facility]  . KNEE SURGERY  2000 2011   right knee replacement   . REPLACEMENT TOTAL KNEE  2000   left  . SHOULDER SURGERY  2004 2010   . TRANSTHORACIC ECHOCARDIOGRAM  07/2010   EF=>55%; LA mildly dilated; trace MR/TR;  . TUBAL LIGATION  1976    Allergies  Allergen Reactions  . Contrast Media [Iodinated Diagnostic Agents] Other (See Comments)    Reaction unknown  . Nsaids Other (See Comments)    Abnormal renal function  . Penicillins Rash and Other (See Comments)    Pt has taken Keflex without difficulty Has patient had a PCN reaction causing immediate rash, facial/tongue/throat swelling,  SOB or lightheadedness with hypotension: yes Has patient had a PCN reaction causing severe rash involving mucus membranes or skin necrosis: no Has patient had a PCN reaction that required hospitalization no Has patient had a PCN reaction occurring within the last 10 years: no If all of the above answers are "NO", then may proceed with Cephalosporin use.    Current Outpatient Prescriptions  Medication Sig Dispense Refill  . amLODipine (NORVASC) 10 MG tablet TAKE 1 TABLET BY MOUTH AT BEDTIME. 30 tablet 0  . aspirin (ECOTRIN LOW STRENGTH) 81 MG EC tablet Take 81 mg by mouth at bedtime. Swallow whole.     . BD PEN NEEDLE NANO U/F 32G X 4 MM MISC See admin instructions.  4  . Blood Glucose Monitoring Suppl (ONE TOUCH ULTRA MINI) W/DEVICE KIT Use as directed 1 each 0  . estradiol (VIVELLE-DOT) 0.05 MG/24HR patch Place 0.05 patches onto the skin 2 (two) times a week. Apply on Tuesday and Saturday.  12  . furosemide (LASIX) 20 MG tablet Take 40 mg by mouth daily.    [name] Kitchen gabapentin (NEURONTIN) 100 MG capsule Take 100 mg by mouth at bedtime.    [name] Kitchen HUMULIN N KWIKPEN 100 UNIT/ML Kiwkpen Inject 14 Units into the skin at bedtime.   6  . Lancets Misc. KIT 1 Device by Does not apply route 2 (two) times daily. 100 each 3  . lisinopril (PRINIVIL,ZESTRIL) 10 MG tablet TAKE 1 TABLET BY MOUTH EVERY DAY 30 tablet 3  . nebivolol (BYSTOLIC) 10 MG tablet Take 1 tablet (10 mg total) by mouth daily. 30 tablet 6  . ONE TOUCH ULTRA TEST test strip USE TWICE A DAY AND AS NEEDED 100 each 3  . progesterone (PROMETRIUM) 200 MG capsule Take 200 mg by mouth daily with supper.    . pyridOXINE (VITAMIN B-6) 100 MG tablet Take 200 mg by mouth daily.    . rosuvastatin (CRESTOR) 40 MG tablet Take 1 tablet (40 mg total) by mouth at bedtime. 90 tablet 3   No current facility-administered medications for this visit.     Social History   Social History  . Marital status: Divorced    Spouse name: N/A  . Number of children: 2  .  Years of education: N/A   Occupational History  . Not on file.   Social History Main Topics  . Smoking status: Never Smoker  . Smokeless tobacco: Never Used  . Alcohol use No  . Drug use: No  . Sexual activity: Not on file   Other Topics Concern  . Not on file   Social History Narrative   Work or School: none      Home Situation: lives alone, feels safe, son lives nearby      Spiritual Beliefs: Darrick Meigs  Lifestyle: Y 3x per week, healthy diet         Social history is notable that she's divorced has 2 children one grandchild. She does exercise. There is no tobacco or alcohol use..  Family History  Problem Relation Age of Onset  . Hypertension Mother   . Diabetes Mother   . Hyperlipidemia Mother   . Heart attack Mother   . Heart disease Father   . Heart attack Father   . Stroke Brother   . Heart disease Brother   . Diabetes Brother   . Uterine cancer Maternal Grandmother     ROS General: Negative; No fevers, chills, or night sweats; No significant change in weight HEENT: Negative; No changes in vision or hearing, sinus congestion, difficulty swallowing Pulmonary: Negative; No cough, wheezing, shortness of breath, hemoptysis Cardiovascular: See history of present illness Positive for occasional leg swelling GI: Negative; No nausea, vomiting, diarrhea, or abdominal pain GU: Negative; No dysuria, hematuria, or difficulty voiding Musculoskeletal: Negative; no myalgias, joint pain, or weakness Hematologic/Oncology: Negative; no easy bruising, bleeding Endocrine: Positive for diabetes mellitus, positive for hyperparathyroidism Neuro: Negative; no changes in balance, headaches Skin: Negative; No rashes or skin lesions Psychiatric: Negative; No behavioral problems, depression Sleep: Negative; No snoring, daytime sleepiness, hypersomnolence, bruxism, restless legs, hypnogognic hallucinations, no cataplexy Other comprehensive 14 point system review is  negative.   PE BP (!) 140/52   Pulse 60   Ht 5' 4.5" (1.638 m)   Wt 197 lb (89.4 kg)   BMI 33.29 kg/m    Repeat blood pressure by me was 140/70  Wt Readings from Last 3 Encounters:  04/08/17 197 lb (89.4 kg)  01/28/17 198 lb 6.4 oz (90 kg)  10/29/16 200 lb 8 oz (90.9 kg)     Physical Exam BP (!) 140/52   Pulse 60   Ht 5' 4.5" (1.638 m)   Wt 197 lb (89.4 kg)   BMI 33.29 kg/m  General: Alert, oriented, no distress.  Skin: normal turgor, no rashes, warm and dry HEENT: Normocephalic, atraumatic. Pupils equal round and reactive to light; sclera anicteric; extraocular muscles intact;  Nose without nasal septal hypertrophy Mouth/Parynx benign; Mallinpatti scale 2 Neck: No JVD, no carotid bruits; normal carotid upstroke Lungs: clear to ausculatation and percussion; no wheezing or rales Chest wall: without tenderness to palpitation Heart: PMI not displaced, RRR, s1 s2 normal, 2/6 systolic murmur in the aortic area and left sternal border, no diastolic murmur, no rubs, gallops, thrills, or heaves Abdomen: soft, nontender; no hepatosplenomehaly, BS+; abdominal aorta nontender and not dilated by palpation. Back: no CVA tenderness Pulses 2+ Musculoskeletal: full range of motion, normal strength, no joint deformities Extremities: Trivial, if any ankle edema; no clubbing cyanosis, Homan's sign negative  Neurologic: grossly nonfocal; Cranial nerves grossly wnl Psychologic: Normal mood and affect   ECG (independently read by me): sinus rhythm with first-degree AV block.  LVH by voltage criteria.  PR interval 250 ms.  April 2017 ECG (independently read by me): Normal sinus rhythm at 61 bpm with first-degree AV block with a PR interval of 244 ms.  LVH by voltage.  September 2016 ECG (independently read by me): Normal sinus rhythm at 60 bpm.  First degree block with a PR interval at 252 ms.  No significant ST-T changes.  October 2015 ECG (independently read by me): Sinus bradycardia at  57 beats per minute.  First degree AV block with a PR interval of 236 ms   Prior September 2014 ECG: Sinus rhythm  at 60 beats per minute. First grade AV block with PR interval of 244 ms. QTC interval 356 ms  LABS: BMP Latest Ref Rng & Units 01/28/2017 10/29/2016 07/02/2016  Glucose 70 - 99 mg/dL 133(H) 149(H) 115(H)  BUN 6 - 23 mg/dL 38(H) 36(H) 31(H)  Creatinine 0.40 - 1.20 mg/dL 1.63(H) 1.83(H) 1.57(H)  Sodium 135 - 145 mEq/L 137 136 138  Potassium 3.5 - 5.1 mEq/L 4.3 4.4 4.5  Chloride 96 - 112 mEq/L 104 101 104  CO2 19 - 32 mEq/L '27 28 27  '$ Calcium 8.4 - 10.5 mg/dL 10.7(H) 10.6(H) 10.6(H)   Hepatic Function Latest Ref Rng & Units 05/24/2014 01/23/2014 10/25/2013  Total Protein 6.0 - 8.3 g/dL 8.0 7.8 7.9  Albumin 3.5 - 5.2 g/dL 4.0 3.9 3.8  AST 0 - 37 U/L '25 23 26  '$ ALT 0 - 35 U/L '15 16 22  '$ Alk Phosphatase 39 - 117 U/L 44 43 46  Total Bilirubin 0.2 - 1.2 mg/dL 0.6 0.5 0.5  Bilirubin, Direct 0.0 - 0.3 mg/dL 0.1 0.0 0.0   CBC Latest Ref Rng & Units 10/29/2016 07/02/2016 05/10/2013  WBC 4.0 - 10.5 K/uL 8.1 7.7 9.5  Hemoglobin 12.0 - 15.0 g/dL 12.2 12.3 12.9  Hematocrit 36.0 - 46.0 % 36.2 36.5 37.6  Platelets 150.0 - 400.0 K/uL 198.0 191.0 206   Lab Results  Component Value Date   MCV 95.7 10/29/2016   MCV 96.1 07/02/2016   MCV 97.2 05/10/2013   Lab Results  Component Value Date   TSH 2.00 01/30/2016   Lab Results  Component Value Date   HGBA1C 8.0 (H) 01/28/2017   Lab Results  Component Value Date   CALCIUM 10.7 (H) 01/28/2017   Lipid Panel     Component Value Date/Time   CHOL 154 07/02/2016 0840   TRIG 70.0 07/02/2016 0840   HDL 46.20 07/02/2016 0840   CHOLHDL 3 07/02/2016 0840   VLDL 14.0 07/02/2016 0840   LDLCALC 93 07/02/2016 0840    RADIOLOGY: No results found.  IMPRESSION:  1. Mild aortic valve stenosis   2. Essential hypertension   3. Chronic kidney disease, stage III (moderate)   4. First degree AV block     ASSESSMENT AND PLAN: Bonnie Chavez is a  83 year old African-American female who has a long-standing history of hypertension with grade 1 diastolic dysfunction and normal systolic function documented by her echocardiogram in September 2011. At that time she had aortic valve sclerosis without stenosis and  mitral annular calcification with trace MR and trace TR.  Her blood pressure today is fairly stable, but mildly increased at 086 systolic based on new hypertensive guidelines on her current regimen consisting of amlodipine 10 mg, furosemide 40 g daily, lisinopril 10 mg in addition to her Bystolic.  I previously reduced her Bystolic dose from 15 mg.  She continues to have first-degree AV block and resting pulse is 60.  Her last echo Doppler study from 08/23/2015 showed an ejection fraction of 55-60%, mild LVH and grade 1 diastolic dysfunction.  There is very mild aortic stenosis with a mean gradient of 10 and peak gradient of 22.  Her recent blood work done by Fredrik Rigger, PA-C, revealed her creatinine at 1.5 which had slightly improved.  She remained a normal at 10.3.  She was minimally anemic with hemoglobin 11.9 and hematocrit 34.5.  Iron saturation was 16% and ferritin was normal at 294.  I have recommended that she undergo a 2 year follow-up echo Doppler study in September  2018.  I will see her in follow-up of that echo in October for follow-up cardiology evaluation.   Time spent: 25 minutes  Troy Sine, MD, [facility]  04/10/2017 1:02 PM

## 2017-04-29 ENCOUNTER — Encounter: Payer: Self-pay | Admitting: Family Medicine

## 2017-05-19 ENCOUNTER — Other Ambulatory Visit: Payer: Self-pay | Admitting: Cardiovascular Disease

## 2017-05-20 ENCOUNTER — Other Ambulatory Visit: Payer: Self-pay | Admitting: Obstetrics and Gynecology

## 2017-05-24 NOTE — Patient Instructions (Addendum)
Your procedure is scheduled on:  Thursday, June 03, 2017  Enter through the Main Entrance of [facility] at:  6:00 AM  Pick up the phone at the desk and dial [phone].  Call this number if you have problems the morning of surgery: [phone].  Remember: Do NOT eat food or drink after:  Midnight Wednesday  Take these medicines the morning of surgery with a SIP OF WATER:  Bystolic  Take 1/2 (half) bedtime insulin dose the night before surgery  Stop ALL herbal medications at this time  Do NOT smoke the day of surgery.  Do NOT wear jewelry (body piercing), metal hair clips/bobby pins, make-up, artifical eyelashes or nail polish. Do NOT wear lotions, powders, or perfumes.  You may wear deodorant. Do NOT shave for 48 hours prior to surgery. Do NOT bring valuables to the hospital. Contacts, dentures, or bridgework may not be worn into surgery.  Have a responsible adult drive you home and stay with you for 24 hours after your procedure  Bring a copy of your healthcare power of attorney and living will documents.

## 2017-05-25 ENCOUNTER — Encounter: Payer: Self-pay | Admitting: Family Medicine

## 2017-05-25 ENCOUNTER — Encounter (HOSPITAL_COMMUNITY): Payer: Self-pay

## 2017-05-25 ENCOUNTER — Encounter (HOSPITAL_COMMUNITY)
Admission: RE | Admit: 2017-05-25 | Discharge: 2017-05-25 | Disposition: A | Discharge status: Home / Self Care | Payer: Medicare Other | Source: Ambulatory Visit | Attending: Obstetrics and Gynecology | Admitting: Obstetrics and Gynecology

## 2017-05-25 DIAGNOSIS — Z01812 Encounter for preprocedural laboratory examination: Secondary | ICD-10-CM | POA: Insufficient documentation

## 2017-05-25 DIAGNOSIS — N950 Postmenopausal bleeding: Secondary | ICD-10-CM | POA: Insufficient documentation

## 2017-05-25 HISTORY — DX: Polyneuropathy, unspecified: G62.9

## 2017-05-25 HISTORY — DX: Pneumonia, unspecified organism: J18.9

## 2017-05-25 HISTORY — DX: Anemia, unspecified: D64.9

## 2017-05-25 LAB — CBC
HCT: 36.3 % (ref 36.0–46.0)
Hemoglobin: 12.1 g/dL (ref 12.0–15.0)
MCH: 32.7 pg (ref 26.0–34.0)
MCHC: 33.3 g/dL (ref 30.0–36.0)
MCV: 98.1 fL (ref 78.0–100.0)
PLATELETS: 203 10*3/uL (ref 150–400)
RBC: 3.7 MIL/uL — AB (ref 3.87–5.11)
RDW: 13.1 % (ref 11.5–15.5)
WBC: 6.8 10*3/uL (ref 4.0–10.5)

## 2017-05-25 LAB — BASIC METABOLIC PANEL
Anion gap: 6 (ref 5–15)
BUN: 32 mg/dL — ABNORMAL HIGH (ref 6–20)
CHLORIDE: 100 mmol/L — AB (ref 101–111)
CO2: 30 mmol/L (ref 22–32)
CREATININE: 1.67 mg/dL — AB (ref 0.44–1.00)
Calcium: 10.9 mg/dL — ABNORMAL HIGH (ref 8.9–10.3)
GFR calc non Af Amer: 28 mL/min — ABNORMAL LOW (ref >60)
GFR, EST AFRICAN AMERICAN: 32 mL/min — AB (ref >60)
GLUCOSE: 256 mg/dL — AB (ref 65–99)
Potassium: 4.1 mmol/L (ref 3.5–5.1)
Sodium: 136 mmol/L (ref 135–145)

## 2017-05-25 NOTE — Pre-Procedure Instructions (Signed)
There is a note in Epic from Dr. Claiborne Billings May 2018, per Dr. Sharlene Motts Bonnie Chavez does not require a cardiac clearance letter for surgery June 03, 2017.

## 2017-05-25 NOTE — Pre-Procedure Instructions (Signed)
Dr. Seward Speck made aware of glucose 256 at pre op.  We will recheck day of surgery. No new orders received at this time.

## 2017-06-02 NOTE — H&P (Signed)
Bonnie Chavez is an 83 y.o. female.who presents for hysteroscopic evaluation of postmenopausal bleeding that recurred even after stopping HRT.  She has had 2 prior hysteroscopies for the same dx with the finding of polyps each time.  These were both while pt was using combined continuous HRT to trat severe hot flashes.  Pertinent Gynecological History: Menses: post-menopausal Bleeding: post menopausal bleeding Contraception: menopause DES exposure: denies Blood transfusions: none Sexually transmitted diseases: no past history Previous GYN Procedures: hysteroscopy x2  Last mammogram: normal Date: 2018 Last pap: normal Date:2018 OB History: G3, P2   Menstrual History: Menarche age: 68 No LMP recorded. Patient is postmenopausal.    Past Medical History:  Diagnosis Date  . Anemia    History of , resolved  . Aortic valve sclerosis   . Carpal tunnel syndrome   . Chicken pox   . Chronic low back pain 02/27/2016   -DDD -seeing [facility]   . Diabetes mellitus    type 2, sees Dr. Chalmers Cater  . Goiter    Dr. Chalmers Cater  . Heart murmur   . History of nuclear stress test 06/2010   dipyridamole; normal pattern of perfusion; ekg negative for ischemia; low risk scan   . Hot flashes   . Hyperlipidemia   . Hyperparathyroidism ([place]) 02/27/2016   -sees endocrinologist, Dr. Chalmers Cater, has seen surgeon as well   . Hypertension   . Migraine    no longer has migraines (80's)  . Mitral valve prolapse   . Neuropathy   . Obesity 02/27/2016  . Osteoarthritis    DDD, sees [facility]  . PMB (postmenopausal bleeding) 02/28/2009   Resolved  . Pneumonia    history of   . Renal insufficiency    [name]   . Vaginal atrophy   . Vitamin D deficiency     Past Surgical History:  Procedure Laterality Date  . [place] &1976  . CHOLECYSTECTOMY  1991  . COLONOSCOPY    . DILATATION & CURRETTAGE/HYSTEROSCOPY WITH RESECTOCOPE N/A 05/15/2013   Procedure: [place];  Surgeon: Eldred Manges, MD;  Location: [facility];  Service: Gynecology;  Laterality: N/A;  . DILATION AND CURETTAGE OF UTERUS  1975  . [place]  . KNEE SURGERY  2000 2011   right knee replacement   . REPLACEMENT TOTAL KNEE  2000   left  . SHOULDER SURGERY  2004 2010   . TRANSTHORACIC ECHOCARDIOGRAM  07/2010   EF=>55%; LA mildly dilated; trace MR/TR;  . TUBAL LIGATION  1976    Family History  Problem Relation Age of Onset  . Hypertension Mother   . Diabetes Mother   . Hyperlipidemia Mother   . Heart attack Mother   . Heart disease Father   . Heart attack Father   . Stroke Brother   . Heart disease Brother   . Diabetes Brother   . Uterine cancer Maternal Grandmother     Social History:  reports that she has never smoked. She has never used smokeless tobacco. She reports that she does not drink alcohol or use drugs.  Allergies:  Allergies  Allergen Reactions  . Contrast Media [Iodinated Diagnostic Agents] Other (See Comments)    Reaction unknown  . Nsaids Other (See Comments)    Abnormal renal function  . Penicillins Rash and Other (See Comments)    Pt has taken Keflex without difficulty Has patient had a PCN reaction causing immediate rash, facial/tongue/throat swelling, SOB or lightheadedness  with hypotension: yes Has patient had a PCN reaction causing severe rash involving mucus membranes or skin necrosis: no Has patient had a PCN reaction that required hospitalization no Has patient had a PCN reaction occurring within the last 10 years: no If all of the above answers are "NO", then may proceed with Cephalosporin use.    No prescriptions prior to admission.    Review of Systems  Constitutional: Negative.   HENT: Positive for hearing loss (uses a hearing aid).   Eyes: Negative.   Respiratory: Negative.   Cardiovascular:       Well managed and compensated cardiac disease  Gastrointestinal: Negative.   Genitourinary: Negative.   Negative for dysuria.  Skin: Negative.   Neurological: Negative.   Endo/Heme/Allergies: Negative.   Psychiatric/Behavioral: Negative.     There were no vitals taken for this visit. Physical Exam  Constitutional: She is oriented to person, place, and time. She appears well-developed and well-nourished.  HENT:  Head: Normocephalic and atraumatic.  Eyes: EOM are normal.  Neck: Neck supple.  Cardiovascular: Normal rate and regular rhythm.   Respiratory: Effort normal and breath sounds normal.  GI: Soft. Bowel sounds are normal.  Exam limited by body habitus  Genitourinary:  Genitourinary Comments: Pelvic exam:  VULVA: normal appearing vulva with no masses, tenderness or lesions,  VAGINA: atrophic,  CERVIX: cervical stenosis present, atrophic,  UTERUS: uterus is normal size, shape, consistency and nontender,  ADNEXA: normal adnexa in size, nontender and no masses.  Musculoskeletal: Normal range of motion.  Neurological: She is alert and oriented to person, place, and time.  Skin: Skin is warm and dry.  Psychiatric: She has a normal mood and affect.  Pelvic ultrasound 02/2017 showed thin endometrium at 39m and fundal calcifications.  Ovaries not identified. Recent Results (from the past 2160 hour(s))  Basic metabolic panel     Status: Abnormal   Collection Time: 05/25/17  9:00 AM  Result Value Ref Range   Sodium 136 135 - 145 mmol/L   Potassium 4.1 3.5 - 5.1 mmol/L   Chloride 100 (L) 101 - 111 mmol/L   CO2 30 22 - 32 mmol/L   Glucose, Bld 256 (H) 65 - 99 mg/dL   BUN 32 (H) 6 - 20 mg/dL   Creatinine, Ser 1.67 (H) 0.44 - 1.00 mg/dL   Calcium 10.9 (H) 8.9 - 10.3 mg/dL   GFR calc non Af Amer 28 (L) >60 mL/min   GFR calc Af Amer 32 (L) >60 mL/min    Comment: (NOTE) The eGFR has been calculated using the CKD EPI equation. This calculation has not been validated in all clinical situations. eGFR's persistently <60 mL/min signify possible Chronic Kidney Disease.    Anion gap 6 5 -  15  CBC     Status: Abnormal   Collection Time: 05/25/17  9:00 AM  Result Value Ref Range   WBC 6.8 4.0 - 10.5 K/uL   RBC 3.70 (L) 3.87 - 5.11 MIL/uL   Hemoglobin 12.1 12.0 - 15.0 g/dL   HCT 36.3 36.0 - 46.0 %   MCV 98.1 78.0 - 100.0 fL   MCH 32.7 26.0 - 34.0 pg   MCHC 33.3 30.0 - 36.0 g/dL   RDW 13.1 11.5 - 15.5 %   Platelets 203 150 - 400 K/uL    Assessment/Plan: Pt has had 2 prior episodes of postmenoopausal bleeding with finding of polyps on hysteroscopy. Hysteroscopy again recommended and accepted.   Indications, risks and benefits have been reviewed and she  wishes to proceed.  Ashwika Freels P 06/02/2017, 5:47 PM

## 2017-06-03 ENCOUNTER — Encounter (HOSPITAL_COMMUNITY)
Admission: RE | Disposition: A | Discharge status: Home / Self Care | Payer: Self-pay | Source: Ambulatory Visit | Attending: Obstetrics and Gynecology

## 2017-06-03 ENCOUNTER — Encounter (HOSPITAL_COMMUNITY): Payer: Self-pay

## 2017-06-03 ENCOUNTER — Ambulatory Visit (HOSPITAL_COMMUNITY): Payer: Medicare Other | Admitting: Anesthesiology

## 2017-06-03 ENCOUNTER — Ambulatory Visit (HOSPITAL_COMMUNITY)
Admission: RE | Admit: 2017-06-03 | Discharge: 2017-06-03 | Disposition: A | Discharge status: Home / Self Care | Payer: Medicare Other | Source: Ambulatory Visit | Attending: Obstetrics and Gynecology | Admitting: Obstetrics and Gynecology

## 2017-06-03 DIAGNOSIS — E119 Type 2 diabetes mellitus without complications: Secondary | ICD-10-CM | POA: Diagnosis not present

## 2017-06-03 DIAGNOSIS — I341 Nonrheumatic mitral (valve) prolapse: Secondary | ICD-10-CM | POA: Diagnosis not present

## 2017-06-03 DIAGNOSIS — Z96651 Presence of right artificial knee joint: Secondary | ICD-10-CM | POA: Diagnosis not present

## 2017-06-03 DIAGNOSIS — I10 Essential (primary) hypertension: Secondary | ICD-10-CM | POA: Insufficient documentation

## 2017-06-03 DIAGNOSIS — Z880 Allergy status to penicillin: Secondary | ICD-10-CM | POA: Insufficient documentation

## 2017-06-03 DIAGNOSIS — Z6833 Body mass index (BMI) 33.0-33.9, adult: Secondary | ICD-10-CM | POA: Diagnosis not present

## 2017-06-03 DIAGNOSIS — Z794 Long term (current) use of insulin: Secondary | ICD-10-CM | POA: Insufficient documentation

## 2017-06-03 DIAGNOSIS — Z886 Allergy status to analgesic agent status: Secondary | ICD-10-CM | POA: Diagnosis not present

## 2017-06-03 DIAGNOSIS — N858 Other specified noninflammatory disorders of uterus: Secondary | ICD-10-CM | POA: Diagnosis not present

## 2017-06-03 DIAGNOSIS — E785 Hyperlipidemia, unspecified: Secondary | ICD-10-CM | POA: Diagnosis not present

## 2017-06-03 DIAGNOSIS — Z7982 Long term (current) use of aspirin: Secondary | ICD-10-CM | POA: Insufficient documentation

## 2017-06-03 DIAGNOSIS — Z91041 Radiographic dye allergy status: Secondary | ICD-10-CM | POA: Diagnosis not present

## 2017-06-03 DIAGNOSIS — N950 Postmenopausal bleeding: Secondary | ICD-10-CM | POA: Insufficient documentation

## 2017-06-03 DIAGNOSIS — E213 Hyperparathyroidism, unspecified: Secondary | ICD-10-CM | POA: Diagnosis not present

## 2017-06-03 DIAGNOSIS — Z79899 Other long term (current) drug therapy: Secondary | ICD-10-CM | POA: Diagnosis not present

## 2017-06-03 DIAGNOSIS — M1990 Unspecified osteoarthritis, unspecified site: Secondary | ICD-10-CM | POA: Diagnosis not present

## 2017-06-03 DIAGNOSIS — E669 Obesity, unspecified: Secondary | ICD-10-CM | POA: Diagnosis not present

## 2017-06-03 HISTORY — PX: DILATATION & CURRETTAGE/HYSTEROSCOPY WITH RESECTOCOPE: SHX5572

## 2017-06-03 LAB — GLUCOSE, CAPILLARY
GLUCOSE-CAPILLARY: 142 mg/dL — AB (ref 65–99)
GLUCOSE-CAPILLARY: 140 mg/dL — AB (ref 65–99)

## 2017-06-03 SURGERY — DILATATION & CURETTAGE/HYSTEROSCOPY WITH RESECTOCOPE
Anesthesia: General | Site: Vagina

## 2017-06-03 MED ORDER — ACETAMINOPHEN 500 MG PO TABS: 500.0000 mg | ORAL_TABLET | ORAL | 0 refills | Status: DC | PRN

## 2017-06-03 MED ORDER — LIDOCAINE HCL 2 % IJ SOLN
INTRAMUSCULAR | Status: AC
  Filled 2017-06-03: qty 20

## 2017-06-03 MED ORDER — PROPOFOL 10 MG/ML IV BOLUS
INTRAVENOUS | Status: AC
  Filled 2017-06-03: qty 20

## 2017-06-03 MED ORDER — FENTANYL CITRATE (PF) 100 MCG/2ML IJ SOLN
INTRAMUSCULAR | Status: DC | PRN
  Administered 2017-06-03 (×2): 25 ug via INTRAVENOUS

## 2017-06-03 MED ORDER — SODIUM CHLORIDE 0.9 % IR SOLN
Status: DC | PRN
  Administered 2017-06-03: 3000 mL

## 2017-06-03 MED ORDER — ACETAMINOPHEN 325 MG PO TABS
325.0000 mg | ORAL_TABLET | ORAL | Status: DC | PRN
  Administered 2017-06-03: 325 mg via ORAL

## 2017-06-03 MED ORDER — FENTANYL CITRATE (PF) 100 MCG/2ML IJ SOLN
INTRAMUSCULAR | Status: AC
  Filled 2017-06-03: qty 2

## 2017-06-03 MED ORDER — FENTANYL CITRATE (PF) 100 MCG/2ML IJ SOLN: 25.0000 ug | INTRAMUSCULAR | Status: DC | PRN

## 2017-06-03 MED ORDER — ONDANSETRON HCL 4 MG/2ML IJ SOLN
INTRAMUSCULAR | Status: AC
  Filled 2017-06-03: qty 2

## 2017-06-03 MED ORDER — MEPERIDINE HCL 25 MG/ML IJ SOLN: 6.2500 mg | INTRAMUSCULAR | Status: DC | PRN

## 2017-06-03 MED ORDER — LIDOCAINE HCL (CARDIAC) 20 MG/ML IV SOLN
INTRAVENOUS | Status: DC | PRN
  Administered 2017-06-03: 70 mg via INTRAVENOUS

## 2017-06-03 MED ORDER — LIDOCAINE HCL (CARDIAC) 20 MG/ML IV SOLN
INTRAVENOUS | Status: AC
  Filled 2017-06-03: qty 5

## 2017-06-03 MED ORDER — PROPOFOL 10 MG/ML IV BOLUS
INTRAVENOUS | Status: DC | PRN
  Administered 2017-06-03: 150 mg via INTRAVENOUS

## 2017-06-03 MED ORDER — LIDOCAINE HCL 2 % IJ SOLN
INTRAMUSCULAR | Status: DC | PRN
  Administered 2017-06-03: 10 mL

## 2017-06-03 MED ORDER — ONDANSETRON HCL 4 MG/2ML IJ SOLN
4.0000 mg | Freq: Once | INTRAMUSCULAR | Status: AC | PRN
  Administered 2017-06-03: 4 mg via INTRAVENOUS

## 2017-06-03 MED ORDER — ACETAMINOPHEN 160 MG/5ML PO SOLN: 325.0000 mg | ORAL | Status: DC | PRN

## 2017-06-03 MED ORDER — LACTATED RINGERS IV SOLN
INTRAVENOUS | Status: DC
  Administered 2017-06-03: 125 mL/h via INTRAVENOUS

## 2017-06-03 MED ORDER — DIPHENHYDRAMINE HCL 50 MG/ML IJ SOLN
INTRAMUSCULAR | Status: DC | PRN
  Administered 2017-06-03: 12.5 mg via INTRAVENOUS

## 2017-06-03 MED ORDER — OXYCODONE HCL 5 MG PO TABS: 5.0000 mg | ORAL_TABLET | Freq: Once | ORAL | Status: DC | PRN

## 2017-06-03 MED ORDER — OXYCODONE HCL 5 MG/5ML PO SOLN: 5.0000 mg | Freq: Once | ORAL | Status: DC | PRN

## 2017-06-03 MED ORDER — ACETAMINOPHEN 325 MG PO TABS
ORAL_TABLET | ORAL | Status: AC
  Filled 2017-06-03: qty 1

## 2017-06-03 MED ORDER — NEBIVOLOL HCL 10 MG PO TABS: 5.0000 mg | ORAL_TABLET | Freq: Two times a day (BID) | ORAL | 0 refills | Status: DC

## 2017-06-03 SURGICAL SUPPLY — 19 items
BIPOLAR CUTTING LOOP 21FR (ELECTRODE) ×1
BOOTIES KNEE HIGH SLOAN (MISCELLANEOUS) ×4 IMPLANT
CANISTER SUCT 3000ML PPV (MISCELLANEOUS) ×2 IMPLANT
CATH ROBINSON RED A/P 16FR (CATHETERS) ×2 IMPLANT
CLOTH BEACON ORANGE TIMEOUT ST (SAFETY) ×2 IMPLANT
CONTAINER PREFILL 10% NBF 60ML (FORM) ×4 IMPLANT
DILATOR CANAL MILEX (MISCELLANEOUS) IMPLANT
ELECT REM PT RETURN 9FT ADLT (ELECTROSURGICAL)
ELECTRODE REM PT RTRN 9FT ADLT (ELECTROSURGICAL) IMPLANT
GLOVE BIOGEL PI IND STRL 7.0 (GLOVE) ×1 IMPLANT
GLOVE BIOGEL PI INDICATOR 7.0 (GLOVE) ×1
GLOVE SURG SS PI 6.5 STRL IVOR (GLOVE) ×2 IMPLANT
GOWN STRL REUS W/TWL LRG LVL3 (GOWN DISPOSABLE) ×4 IMPLANT
LOOP CUTTING BIPOLAR 21FR (ELECTRODE) ×1 IMPLANT
PACK VAGINAL MINOR WOMEN LF (CUSTOM PROCEDURE TRAY) ×2 IMPLANT
PAD OB MATERNITY 4.3X12.25 (PERSONAL CARE ITEMS) ×2 IMPLANT
TOWEL OR 17X24 6PK STRL BLUE (TOWEL DISPOSABLE) ×4 IMPLANT
TUBING AQUILEX INFLOW (TUBING) ×2 IMPLANT
TUBING AQUILEX OUTFLOW (TUBING) ×2 IMPLANT

## 2017-06-03 NOTE — Discharge Instructions (Signed)
Hysteroscopy Hysteroscopy is a procedure used for looking inside the womb (uterus). It may be done for various reasons, including:  To evaluate abnormal bleeding, fibroid (benign, noncancerous) tumors, polyps, scar tissue (adhesions), and possibly cancer of the uterus.  To look for lumps (tumors) and other uterine growths.  To look for causes of why a woman cannot get pregnant (infertility), causes of recurrent loss of pregnancy (miscarriages), or a lost intrauterine device (IUD).  To perform a sterilization by blocking the fallopian tubes from inside the uterus.  In this procedure, a thin, flexible tube with a tiny light and camera on the end of it (hysteroscope) is used to look inside the uterus. A hysteroscopy should be done right after a menstrual period to be sure you are not pregnant. LET [facility] CARE PROVIDER KNOW ABOUT:  Any allergies you have.  All medicines you are taking, including vitamins, herbs, eye drops, creams, and over-the-counter medicines.  Previous problems you or members of your family have had with the use of anesthetics.  Any blood disorders you have.  Previous surgeries you have had.  Medical conditions you have. RISKS AND COMPLICATIONS Generally, this is a safe procedure. However, as with any procedure, complications can occur. Possible complications include:  Putting a hole in the uterus.  Excessive bleeding.  Infection.  Damage to the cervix.  Injury to other organs.  Allergic reaction to medicines.  Too much fluid used in the uterus for the procedure.  BEFORE THE PROCEDURE  Ask your health care provider about changing or stopping any regular medicines.  Do not take aspirin or blood thinners for 1 week before the procedure, or as directed by your health care provider. These can cause bleeding.  If you smoke, do not smoke for 2 weeks before the procedure.  In some cases, a medicine is placed in the cervix the day before the procedure.  This medicine makes the cervix have a larger opening (dilate). This makes it easier for the instrument to be inserted into the uterus during the procedure.  Do not eat or drink anything for at least 8 hours before the surgery.  Arrange for someone to take you home after the procedure. PROCEDURE  You may be given a medicine to relax you (sedative). You may also be given one of the following: ? A medicine that numbs the area around the cervix (local anesthetic). ? A medicine that makes you sleep through the procedure (general anesthetic).  The hysteroscope is inserted through the vagina into the uterus. The camera on the hysteroscope sends a picture to a TV screen. This gives the surgeon a good view inside the uterus.  During the procedure, air or a liquid is put into the uterus, which allows the surgeon to see better.  Sometimes, tissue is gently scraped from inside the uterus. These tissue samples are sent to a lab for testing. What to expect after the procedure  If you had a general anesthetic, you may be groggy for several hours after the procedure.  If you had a local anesthetic, you will be able to go home as soon as you are stable and feel ready.  You may have some cramping. This normally lasts for a couple days.  You may have bleeding, which varies from light spotting for a few days to menstrual-like bleeding for 3-7 days. This is normal.  If your test results are not back during the visit, make an appointment with your health care provider to find out the results.  This information is not intended to replace advice given to you by your health care provider. Make sure you discuss any questions you have with your health care provider.   Post Anesthesia Home Care Instructions Activity: Get plenty of rest for the remainder of the day. A responsible individual must stay with you for 24 hours following the procedure.  For the next 24 hours, DO NOT: -Drive a car -Conservation officer, nature -Drink alcoholic beverages -Take any medication unless instructed by your physician -Make any legal decisions or sign important papers. Meals: Start with liquid foods such as gelatin or soup. Progress to regular foods as tolerated. Avoid greasy, spicy, heavy foods. If nausea and/or vomiting occur, drink only clear liquids until the nausea and/or vomiting subsides. Call your physician if vomiting continues. Special Instructions/Symptoms: Your throat may feel dry or sore from the anesthesia or the breathing tube placed in your throat during surgery. If this causes discomfort, gargle with warm salt water. The discomfort should disappear within 24 hours.

## 2017-06-03 NOTE — Anesthesia Procedure Notes (Signed)
Procedure Name: LMA Insertion Date/Time: 06/03/2017 7:32 AM Performed by: Tobin Chad Pre-anesthesia Checklist: Patient identified, Emergency Drugs available, Suction available and Patient being monitored Patient Re-evaluated:Patient Re-evaluated prior to induction Oxygen Delivery Method: Circle system utilized and Simple face mask Preoxygenation: Pre-oxygenation with 100% oxygen Induction Type: IV induction Ventilation: Mask ventilation without difficulty LMA Size: 4.0 Grade View: Grade II Number of attempts: 1 Placement Confirmation: positive ETCO2 and breath sounds checked- equal and bilateral Dental Injury: Teeth and Oropharynx as per pre-operative assessment

## 2017-06-03 NOTE — Transfer of Care (Signed)
Immediate Anesthesia Transfer of Care Note  Patient: Bonnie Chavez  Procedure(s) Performed: Procedure(s): DILATATION & CURETTAGE/HYSTEROSCOPY (N/A)  Patient Location: PACU  Anesthesia Type:General  Level of Consciousness: awake, alert , oriented and patient cooperative  Airway & Oxygen Therapy: Patient Spontanous Breathing and Patient connected to nasal cannula oxygen  Post-op Assessment: Report given to RN and Post -op Vital signs reviewed and stable  Post vital signs: Reviewed and stable  Last Vitals:  Vitals:   06/03/17 0621  BP: (!) 119/59  Pulse: (!) 59  Resp: 16  Temp: 36.8 C    Last Pain:  Vitals:   06/03/17 0621  TempSrc: Oral      Patients Stated Pain Goal: 4 (71/99/41 2904)  Complications: No apparent anesthesia complications

## 2017-06-03 NOTE — Anesthesia Postprocedure Evaluation (Signed)
Anesthesia Post Note  Patient: Bonnie Chavez  Procedure(s) Performed: Procedure(s) (LRB): DILATATION & CURETTAGE/HYSTEROSCOPY (N/A)     Patient location during evaluation: PACU Anesthesia Type: General Level of consciousness: awake Pain management: pain level controlled Vital Signs Assessment: post-procedure vital signs reviewed and stable Respiratory status: spontaneous breathing Cardiovascular status: stable Postop Assessment: no signs of nausea or vomiting Anesthetic complications: no    Last Vitals:  Vitals:   06/03/17 0815 06/03/17 0830  BP: (!) 128/94 (!) 118/56  Pulse: 64 (!) 57  Resp: 13 16  Temp:      Last Pain:  Vitals:   06/03/17 0845  TempSrc:   PainSc: 2    Pain Goal: Patients Stated Pain Goal: 4 (06/03/17 0845)               [place]elle

## 2017-06-03 NOTE — Op Note (Signed)
06/03/2017  8:19 AM  PATIENT:  Bonnie Chavez  83 y.o. female  PRE-OPERATIVE DIAGNOSIS:  Post-Menopausal Bleeding  POST-OPERATIVE DIAGNOSIS:  Post-Menopausal Bleeding  PROCEDURE:  Procedure(s): DILATATION & CURETTAGE/HYSTEROSCOPY  SURGEON:  Enslie Sahota P, MD  ASSISTANTS: None  ANESTHESIA:   general  ESTIMATED BLOOD LOSS: Minimal  BLOOD ADMINISTERED:none  COMPLICATIONS: None  FINDINGS: The uterus sounded to 8 cm. At hysteroscopy. Both ostia could be visualized but there were no endometrial lesions. The endometrial tissue was atrophic.  FLUID DEFICIT: 30 cc  LOCAL MEDICATIONS USED:  LIDOCAINE  and Amount: 10 ml  SPECIMEN:  Source of Specimen:  Endometrial curettings  DISPOSITION OF SPECIMEN:  PATHOLOGY  COUNTS:  YES  DESCRIPTION OF PROCEDURE:the patient was taken to the operating room after appropriate identification and placed on the operating table. After the attainment of adequate general anesthesia she was placed in the lithotomy position. The perineum and vagina were prepped with multiple layers of Betadine. The bladder was emptied with a an in and out catheter. The perineum was draped in sterile field. A Graves  speculum was placed in the vagina. The cervix was grasped with a single-tooth tenaculum. A paracervical block was achieved with a total of 10 cc of 2% Xylocaine and the 5 and 7:00 positions. The uterus was sounded.  The cervix was then dilated to accommodate the diagnostic hysteroscope. The hysteroscope was used to evaluate all quadrants of the uterus with the above-noted findings made. All quadrants of the endometrial cavity were curetted producing minimal tissue. All instruments were then removed from the vagina and the patient was awakened from general anesthesia and taken to the recovery room in satisfactory condition having tolerated the procedure well sponge and instrument counts correct.  PLAN OF CARE: Discharge home after postanesthesia care  PATIENT  DISPOSITION:  PACU - hemodynamically stable.   Delay start of Pharmacological VTE agent (>24hrs) due to surgical blood loss or risk of bleeding:  yes. SCD hose were used throughout the entire procedure   Eldred Manges, MD 8:19 AM

## 2017-06-03 NOTE — Anesthesia Preprocedure Evaluation (Signed)
Anesthesia Evaluation  Patient identified by MRN, date of birth, ID band Patient awake    Reviewed: Allergy & Precautions, H&P , NPO status , Patient's Chart, lab work & pertinent test results, reviewed documented beta blocker date and time   Airway Mallampati: I  TM Distance: >3 FB Neck ROM: full    Dental no notable dental hx. (+) Teeth Intact   Pulmonary    Pulmonary exam normal breath sounds clear to auscultation       Cardiovascular hypertension, Pt. on medications and Pt. on home beta blockers negative cardio ROS   Rhythm:regular Rate:Normal     Neuro/Psych negative psych ROS   GI/Hepatic negative GI ROS, Neg liver ROS,   Endo/Other  negative endocrine ROSdiabetes, Type 2, Insulin Dependent  Renal/GU      Musculoskeletal   Abdominal Normal abdominal exam  (+)   Peds  Hematology   Anesthesia Other Findings EKG okay No NSAIA bc of chronic kidney dz BS 144  Reproductive/Obstetrics negative OB ROS                             Anesthesia Physical  Anesthesia Plan  ASA: II  Anesthesia Plan: General   Post-op Pain Management:    Induction: Intravenous  PONV Risk Score and Plan: 4 or greater and Ondansetron, Dexamethasone, Propofol, Midazolam and Scopolamine patch - Pre-op  Airway Management Planned: LMA  Additional Equipment:   Intra-op Plan:   Post-operative Plan:   Informed Consent: I have reviewed the patients History and Physical, chart, labs and discussed the procedure including the risks, benefits and alternatives for the proposed anesthesia with the patient or authorized representative who has indicated his/her understanding and acceptance.   Dental Advisory Given and History available from chart only  Plan Discussed with: CRNA  Anesthesia Plan Comments: (  Discussed  general anesthesia, including possible nausea, instrumentation of airway, sore throat,pulmonary  aspiration, etc. I asked if the were any outstanding questions, or  concerns before we proceeded. )        Anesthesia Quick Evaluation

## 2017-06-04 ENCOUNTER — Encounter (HOSPITAL_COMMUNITY): Payer: Self-pay | Admitting: Obstetrics and Gynecology

## 2017-06-08 ENCOUNTER — Encounter: Payer: Self-pay | Admitting: Family Medicine

## 2017-06-08 ENCOUNTER — Ambulatory Visit (INDEPENDENT_AMBULATORY_CARE_PROVIDER_SITE_OTHER): Payer: Medicare Other | Admitting: Family Medicine

## 2017-06-08 VITALS — BP 124/60 | HR 63 | Temp 98.2°F | Ht 64.5 in | Wt 192.3 lb

## 2017-06-08 DIAGNOSIS — E213 Hyperparathyroidism, unspecified: Secondary | ICD-10-CM

## 2017-06-08 DIAGNOSIS — E1159 Type 2 diabetes mellitus with other circulatory complications: Secondary | ICD-10-CM

## 2017-06-08 DIAGNOSIS — Z794 Long term (current) use of insulin: Secondary | ICD-10-CM

## 2017-06-08 DIAGNOSIS — I10 Essential (primary) hypertension: Secondary | ICD-10-CM

## 2017-06-08 DIAGNOSIS — I5032 Chronic diastolic (congestive) heart failure: Secondary | ICD-10-CM

## 2017-06-08 DIAGNOSIS — N189 Chronic kidney disease, unspecified: Secondary | ICD-10-CM | POA: Diagnosis not present

## 2017-06-08 DIAGNOSIS — E785 Hyperlipidemia, unspecified: Secondary | ICD-10-CM | POA: Diagnosis not present

## 2017-06-08 DIAGNOSIS — E1169 Type 2 diabetes mellitus with other specified complication: Secondary | ICD-10-CM

## 2017-06-08 DIAGNOSIS — E1122 Type 2 diabetes mellitus with diabetic chronic kidney disease: Secondary | ICD-10-CM | POA: Diagnosis not present

## 2017-06-08 DIAGNOSIS — Z6832 Body mass index (BMI) 32.0-32.9, adult: Secondary | ICD-10-CM

## 2017-06-08 DIAGNOSIS — I152 Hypertension secondary to endocrine disorders: Secondary | ICD-10-CM

## 2017-06-08 NOTE — Progress Notes (Signed)
HPI:  Bonnie Chavez is a pleasant 83 y.o. here for follow up. Chronic medical problems summarized below were reviewed for changes . She reports she is doing well. No complaints. Had labs with nephrologists (BMP, iron, calcium, pth - all ok) recently and has labs with Dr. Chalmers Cater next month.  She is monitoring sugars and trying to eat health regular meals and get regular activity. Denies CP, SOB, DOE, treatment intolerance or new symptoms. Note: I see in my prior note a mention of smoking but pt reports never smoked. This may have been an error? Perhaps documented in wrong chart? As I do not see mentioned prior and dx and charge not placed on this pt that I can see. Due for lipid check in 1 month. Due for AWV/CPE in December.  CdCHF/HLD/aortic valve sclerosis/Obesity: -sees cardiologist (Dr. Claiborne Billings) -meds:amlodipine 10, crestor 40, lasix 20, lisinopril 10, nebivolol 5, asa -exercises 3x per week at [employer] in water aerobics  DM, Hyperparathyroidism: -sees endocriologist (Dr. Chalmers Cater) -meds: tradjenta 5; insulin - humulin n 14 units -eye exam with [facility] care 02/2016  CKD/vit D def: -sees nephrology ([name])  HRT for Hot Flashes, Uterine Polyps: -sees gynecologist ([name]) -s/p D and C recently with her gynecologist  DDD/Lumbar radiculopathy/CTS: -seeing [facility]  ROS: See pertinent positives and negatives per HPI.  Past Medical History:  Diagnosis Date  . Anemia    History of , resolved  . Aortic valve sclerosis   . Carpal tunnel syndrome   . Chicken pox   . Chronic low back pain 02/27/2016   -DDD -seeing [facility]   . Diabetes mellitus    type 2, sees Dr. Chalmers Cater  . Goiter    Dr. Chalmers Cater  . Heart murmur   . History of nuclear stress test 06/2010   dipyridamole; normal pattern of perfusion; ekg negative for ischemia; low risk scan   . Hot flashes   . Hyperlipidemia   . Hyperparathyroidism ([place]) 02/27/2016   -sees endocrinologist, Dr. Chalmers Cater, has seen  surgeon as well   . Hypertension   . Migraine    no longer has migraines (80's)  . Mitral valve prolapse   . Neuropathy   . Obesity 02/27/2016  . Osteoarthritis    DDD, sees [facility]  . PMB (postmenopausal bleeding) 02/28/2009   Resolved  . Pneumonia    history of   . Renal insufficiency    [name]   . Vaginal atrophy   . Vitamin D deficiency     Past Surgical History:  Procedure Laterality Date  . [place] &1976  . CHOLECYSTECTOMY  1991  . COLONOSCOPY    . DILATATION & CURRETTAGE/HYSTEROSCOPY WITH RESECTOCOPE N/A 05/15/2013   Procedure: [place];  Surgeon: Eldred Manges, MD;  Location: [facility];  Service: Gynecology;  Laterality: N/A;  . DILATATION & CURRETTAGE/HYSTEROSCOPY WITH RESECTOCOPE N/A 06/03/2017   Procedure: DILATATION & CURETTAGE/HYSTEROSCOPY;  Surgeon: Eldred Manges, MD;  Location: [facility];  Service: Gynecology;  Laterality: N/A;  . DILATION AND CURETTAGE OF UTERUS  1975  . [place]  . KNEE SURGERY  2000 2011   right knee replacement   . REPLACEMENT TOTAL KNEE  2000   left  . SHOULDER SURGERY  2004 2010   . TRANSTHORACIC ECHOCARDIOGRAM  07/2010   EF=>55%; LA mildly dilated; trace MR/TR;  . TUBAL LIGATION  1976    Family History  Problem Relation Age of Onset  . Hypertension  Mother   . Diabetes Mother   . Hyperlipidemia Mother   . Heart attack Mother   . Heart disease Father   . Heart attack Father   . Stroke Brother   . Heart disease Brother   . Diabetes Brother   . Uterine cancer Maternal Grandmother     Social History   Social History  . Marital status: Divorced    Spouse name: N/A  . Number of children: 2  . Years of education: N/A   Social History Main Topics  . Smoking status: Never Smoker  . Smokeless tobacco: Never Used  . Alcohol use No  . Drug use: No  . Sexual activity: Not Asked   Other Topics Concern  . None   Social History Narrative   Work  or School: none      Home Situation: lives alone, feels safe, son lives nearby      Spiritual Beliefs: Christian      Lifestyle: Y 3x per week, healthy diet           Current Outpatient Prescriptions:  .  acetaminophen (TYLENOL) 500 MG tablet, Take 1 tablet (500 mg total) by mouth every 4 (four) hours as needed for moderate pain., Disp: 60 tablet, Rfl: 0 .  amLODipine (NORVASC) 10 MG tablet, TAKE 1 TABLET BY MOUTH AT BEDTIME, Disp: 30 tablet, Rfl: 6 .  aspirin (ECOTRIN LOW STRENGTH) 81 MG EC tablet, Take 81 mg by mouth at bedtime. Swallow whole. , Disp: , Rfl:  .  cephALEXin (KEFLEX) 500 MG capsule, Take 500-1,000 mg by mouth See admin instructions. TAKE ONLY FOR INVASIVE DENTAL PROCEDURES, Disp: , Rfl:  .  furosemide (LASIX) 20 MG tablet, Take 40 mg by mouth daily., Disp: , Rfl:  .  gabapentin (NEURONTIN) 100 MG capsule, Take 100 mg by mouth at bedtime., Disp: , Rfl:  .  HUMULIN N KWIKPEN 100 UNIT/ML Kiwkpen, Inject 14 Units into the skin at bedtime. , Disp: , Rfl: 6 .  lisinopril (PRINIVIL,ZESTRIL) 10 MG tablet, TAKE 1 TABLET BY MOUTH EVERY DAY, Disp: 30 tablet, Rfl: 3 .  nebivolol (BYSTOLIC) 10 MG tablet, Take 0.5 tablets (5 mg total) by mouth 2 (two) times daily., Disp: 10 tablet, Rfl: 0 .  pyridOXINE (VITAMIN B-6) 100 MG tablet, Take 100 mg by mouth 2 (two) times daily. , Disp: , Rfl:  .  rosuvastatin (CRESTOR) 40 MG tablet, Take 1 tablet (40 mg total) by mouth at bedtime., Disp: 90 tablet, Rfl: 3  EXAM:  Vitals:   06/08/17 0905  BP: 124/60  Pulse: 63  Temp: 98.2 F (36.8 C)    Body mass index is 32.5 kg/m.  GENERAL: vitals reviewed and listed above, alert, oriented, appears well hydrated and in no acute distress  HEENT: atraumatic, conjunttiva clear, no obvious abnormalities on inspection of external nose and ears  NECK: no obvious masses on inspection  LUNGS: clear to auscultation bilaterally, no wheezes, rales or rhonchi, good air movement  CV: HRRR, no  peripheral edema  MS: moves all extremities without noticeable abnormality  PSYCH: pleasant and cooperative, no obvious depression or anxiety  ASSESSMENT AND PLAN:  Discussed the following assessment and plan:  BMI 32.0-32.9,adult  Type 2 diabetes mellitus with chronic kidney disease, with long-term current use of insulin, unspecified CKD stage (HCC)  Hypertension associated with diabetes ([place])  Hyperlipidemia associated with type 2 diabetes mellitus (HCC)  Chronic kidney disease, unspecified CKD stage  Chronic diastolic congestive heart failure ([place])  Hyperparathyroidism ([place])  -  reviewed blood sugars, discuss dietary or insulin changes - she plans to discuss with her endocrinologist and do labs there -renal labs reviewed -lifestyle recs -lipid check at physical -continue current tx for now with specialist follow up as planned -Patient advised to return or notify a doctor immediately if symptoms worsen or persist or new concerns arise.  Patient Instructions  BEFORE YOU LEAVE: -follow up: AWV with [name] and CPE with Dr. Maudie Mercury in December on same day - come fasting   We recommend the following healthy lifestyle for LIFE: 1) Small portions.   Tip: eat off of a salad plate instead of a dinner plate.  Tip: It is ok to feel hungry after a meal - that likely means you ate an appropriate portion.  Tip: if you need more or a snack choose fruits, veggies and/or a handful of nuts or seeds.  2) Eat a healthy clean diet.   TRY TO EAT: -at least 5-7 servings of low sugar vegetables per day (not corn, potatoes or bananas.) -berries are the best choice if you wish to eat fruit.   -lean meets (fish, chicken or [place] breasts) -vegan proteins for some meals - beans or tofu, whole grains, nuts and seeds -Replace bad fats with good fats - good fats include: fish, nuts and seeds, canola oil, olive oil -small amounts of low fat or non fat dairy -small amounts of100 % whole grains - check  the lables  AVOID: -SUGAR, sweets, anything with added sugar, corn syrup or sweeteners -if you must have a sweetener, small amounts of stevia may be best -sweetened beverages -simple starches (rice, bread, potatoes, pasta, chips, etc - small amounts of 100% whole grains are ok) -red meat, pork, butter -fried foods, fast food, processed food, excessive dairy, eggs and coconut.  3)Get at least 150 minutes of sweaty aerobic exercise per week.  4)Reduce stress - consider counseling, meditation and relaxation to balance other aspects of your life.    Colin Benton R., DO

## 2017-06-08 NOTE — Patient Instructions (Addendum)
BEFORE YOU LEAVE: -follow up: AWV with [name] and CPE with Dr. Maudie Mercury in December on same day - come fasting   We recommend the following healthy lifestyle for LIFE: 1) Small portions.   Tip: eat off of a salad plate instead of a dinner plate.  Tip: It is ok to feel hungry after a meal - that likely means you ate an appropriate portion.  Tip: if you need more or a snack choose fruits, veggies and/or a handful of nuts or seeds.  2) Eat a healthy clean diet.   TRY TO EAT: -at least 5-7 servings of low sugar vegetables per day (not corn, potatoes or bananas.) -berries are the best choice if you wish to eat fruit.   -lean meets (fish, chicken or [place] breasts) -vegan proteins for some meals - beans or tofu, whole grains, nuts and seeds -Replace bad fats with good fats - good fats include: fish, nuts and seeds, canola oil, olive oil -small amounts of low fat or non fat dairy -small amounts of100 % whole grains - check the lables  AVOID: -SUGAR, sweets, anything with added sugar, corn syrup or sweeteners -if you must have a sweetener, small amounts of stevia may be best -sweetened beverages -simple starches (rice, bread, potatoes, pasta, chips, etc - small amounts of 100% whole grains are ok) -red meat, pork, butter -fried foods, fast food, processed food, excessive dairy, eggs and coconut.  3)Get at least 150 minutes of sweaty aerobic exercise per week.  4)Reduce stress - consider counseling, meditation and relaxation to balance other aspects of your life.

## 2017-08-09 ENCOUNTER — Other Ambulatory Visit: Payer: Self-pay

## 2017-08-09 ENCOUNTER — Ambulatory Visit (HOSPITAL_COMMUNITY): Payer: Medicare Other | Attending: Cardiology

## 2017-08-09 ENCOUNTER — Other Ambulatory Visit: Payer: Self-pay | Admitting: Cardiovascular Disease

## 2017-08-09 DIAGNOSIS — I509 Heart failure, unspecified: Secondary | ICD-10-CM | POA: Insufficient documentation

## 2017-08-09 DIAGNOSIS — I110 Hypertensive heart disease with heart failure: Secondary | ICD-10-CM | POA: Insufficient documentation

## 2017-08-09 DIAGNOSIS — I083 Combined rheumatic disorders of mitral, aortic and tricuspid valves: Secondary | ICD-10-CM | POA: Insufficient documentation

## 2017-08-09 DIAGNOSIS — Z6832 Body mass index (BMI) 32.0-32.9, adult: Secondary | ICD-10-CM | POA: Diagnosis not present

## 2017-08-09 DIAGNOSIS — I350 Nonrheumatic aortic (valve) stenosis: Secondary | ICD-10-CM

## 2017-08-09 DIAGNOSIS — E669 Obesity, unspecified: Secondary | ICD-10-CM | POA: Diagnosis not present

## 2017-08-09 DIAGNOSIS — E119 Type 2 diabetes mellitus without complications: Secondary | ICD-10-CM | POA: Insufficient documentation

## 2017-08-09 DIAGNOSIS — E785 Hyperlipidemia, unspecified: Secondary | ICD-10-CM | POA: Insufficient documentation

## 2017-08-09 DIAGNOSIS — I313 Pericardial effusion (noninflammatory): Secondary | ICD-10-CM | POA: Insufficient documentation

## 2017-08-09 NOTE — Telephone Encounter (Signed)
Rx(s) sent to pharmacy electronically.  

## 2017-08-20 ENCOUNTER — Other Ambulatory Visit: Payer: Self-pay

## 2017-08-20 MED ORDER — AMLODIPINE BESYLATE 10 MG PO TABS: 10.0000 mg | ORAL_TABLET | Freq: Every day | ORAL | 0 refills | Status: DC

## 2017-09-02 ENCOUNTER — Encounter: Payer: Self-pay | Admitting: Cardiovascular Disease

## 2017-09-02 ENCOUNTER — Ambulatory Visit (INDEPENDENT_AMBULATORY_CARE_PROVIDER_SITE_OTHER): Payer: Medicare Other | Admitting: Cardiovascular Disease

## 2017-09-02 VITALS — BP 142/56 | HR 57 | Ht 64.5 in | Wt 192.0 lb

## 2017-09-02 DIAGNOSIS — I10 Essential (primary) hypertension: Secondary | ICD-10-CM

## 2017-09-02 DIAGNOSIS — Z794 Long term (current) use of insulin: Secondary | ICD-10-CM

## 2017-09-02 DIAGNOSIS — I440 Atrioventricular block, first degree: Secondary | ICD-10-CM | POA: Diagnosis not present

## 2017-09-02 DIAGNOSIS — R809 Proteinuria, unspecified: Secondary | ICD-10-CM | POA: Diagnosis not present

## 2017-09-02 DIAGNOSIS — E785 Hyperlipidemia, unspecified: Secondary | ICD-10-CM | POA: Diagnosis not present

## 2017-09-02 DIAGNOSIS — I350 Nonrheumatic aortic (valve) stenosis: Secondary | ICD-10-CM

## 2017-09-02 DIAGNOSIS — E1129 Type 2 diabetes mellitus with other diabetic kidney complication: Secondary | ICD-10-CM | POA: Diagnosis not present

## 2017-09-02 NOTE — Progress Notes (Signed)
Patient ID: Bonnie Chavez, female   DOB: 07/05/1934, 83 y.o.   MRN: [id number]      HPI: Bonnie Chavez is a 83 y.o. female presents to the office today for a 5 month cardiology evaluation.  [name] has a history of hypertension with documented grade 1 diastolic dysfunction, hyperlipidemia, type 2 diabetes mellitus with renal insufficiency, as well as aortic valve sclerosis without stenosis. An echo Doppler study in 2011 showed an EF of 55%.  In 2011 a nuclear perfusion scan showed normal perfusion and function.  She has a history of a goiter for which she sees [name], [name] for her renal insufficiency, [name] for uterine polyps and fibroids.  She has a history of hyperparathyroidism and saw [name]; she has not had surgery in his undergoing continued surveillance of her calcium levels.  She underwent a five-year follow-up echo Doppler study on 08/23/2015.  This continued to show normal systolic function with mild LVH and grade 1 diastolic dysfunction.  There was a 10 mm mean gradient and 22 mm peak gradient across her moderately calcified aortic valve with a valve area of 1.63 cm.    She has a long-standing history of diabetes mellitus  and sees [name].  She sees [name] for renal insufficiency.  I reviewed her recent blood work from his office 03/09/2017 which showed her creatinine had improved to 1.5.  In March 2018 Cr was 1.63 and in December 2017 Cr was 1.83.    When I last saw her in May 2018, she had had subsequent blood work which had revealed improvement in creatinine to 1.5.  I recommended she undergo a 2 year follow-up echo Doppler evaluation of her aortic stenosis.  This was done on 08/10/2007 and showed an EF of 65-70%.  Her mean aortic gradient has increased to 16 and her peak gradient 30.  There was grade 1 diastolic dysfunction, mild MR, mild LA dilation and mild TR.  Her estimated aortic valve area is 1.58 cm.  Presently, she denies any chest pain.  She  denies presyncope or syncope.  She tells me her recent hemoglobin A1c was 7.6.  She is  followed by [name] of endocrinology.  A repeat creatinine in August was 1.7.  She continues to be on rosuvastatin 40 mg for hyperlipidemia.  She continues to take Humulin insulin.  She had Been taking Lasix 40 mg, Bystolic 5 mg, lisinopril 10 mg and amlodipine 10 mg for hypertension.  She presents for evaluation.  Past Medical History:  Diagnosis Date  . Anemia    History of , resolved  . Aortic valve sclerosis   . Carpal tunnel syndrome   . Chicken pox   . Chronic low back pain 02/27/2016   -DDD -seeing [name]   . Diabetes mellitus    type 2, sees Dr. Chalmers Cater  . Goiter    Dr. Chalmers Cater  . Heart murmur   . History of nuclear stress test 06/2010   dipyridamole; normal pattern of perfusion; ekg negative for ischemia; low risk scan   . Hot flashes   . Hyperlipidemia   . Hyperparathyroidism ([place]) 02/27/2016   -sees endocrinologist, Dr. Chalmers Cater, has seen surgeon as well   . Hypertension   . Migraine    no longer has migraines (80's)  . Mitral valve prolapse   . Neuropathy   . Obesity 02/27/2016  . Osteoarthritis    DDD, sees [facility]  . PMB (postmenopausal bleeding) 02/28/2009   Resolved  .  Pneumonia    history of   . Renal insufficiency    [name]   . Vaginal atrophy   . Vitamin D deficiency     Past Surgical History:  Procedure Laterality Date  . [place] &1976  . CHOLECYSTECTOMY  1991  . COLONOSCOPY    . DILATATION & CURRETTAGE/HYSTEROSCOPY WITH RESECTOCOPE N/A 05/15/2013   Procedure: [place];  Surgeon: Eldred Manges, MD;  Location: [facility];  Service: Gynecology;  Laterality: N/A;  . DILATATION & CURRETTAGE/HYSTEROSCOPY WITH RESECTOCOPE N/A 06/03/2017   Procedure: DILATATION & CURETTAGE/HYSTEROSCOPY;  Surgeon: Eldred Manges, MD;  Location: [facility];  Service: Gynecology;  Laterality: N/A;  . DILATION AND  CURETTAGE OF UTERUS  1975  . [place]  . KNEE SURGERY  2000 2011   right knee replacement   . REPLACEMENT TOTAL KNEE  2000   left  . SHOULDER SURGERY  2004 2010   . TRANSTHORACIC ECHOCARDIOGRAM  07/2010   EF=>55%; LA mildly dilated; trace MR/TR;  . TUBAL LIGATION  1976    Allergies  Allergen Reactions  . Contrast Media [Iodinated Diagnostic Agents] Other (See Comments)    Reaction unknown  . Nsaids Other (See Comments)    Abnormal renal function  . Penicillins Rash and Other (See Comments)    Pt has taken Keflex without difficulty Has patient had a PCN reaction causing immediate rash, facial/tongue/throat swelling, SOB or lightheadedness with hypotension: yes Has patient had a PCN reaction causing severe rash involving mucus membranes or skin necrosis: no Has patient had a PCN reaction that required hospitalization no Has patient had a PCN reaction occurring within the last 10 years: no If all of the above answers are "NO", then may proceed with Cephalosporin use.    Current Outpatient Prescriptions  Medication Sig Dispense Refill  . acetaminophen (TYLENOL) 500 MG tablet Take 1 tablet (500 mg total) by mouth every 4 (four) hours as needed for moderate pain. 60 tablet 0  . amLODipine (NORVASC) 10 MG tablet Take 1 tablet (10 mg total) by mouth at bedtime. 180 tablet 0  . aspirin (ECOTRIN LOW STRENGTH) 81 MG EC tablet Take 81 mg by mouth at bedtime. Swallow whole.     . cephALEXin (KEFLEX) 500 MG capsule Take 500-1,000 mg by mouth See admin instructions. TAKE ONLY FOR INVASIVE DENTAL PROCEDURES    . furosemide (LASIX) 20 MG tablet Take 20 mg by mouth daily.    [name] Kitchen gabapentin (NEURONTIN) 100 MG capsule Take 100 mg by mouth at bedtime.    [name] Kitchen HUMULIN N KWIKPEN 100 UNIT/ML Kiwkpen Inject 14 Units into the skin at bedtime.   6  . nebivolol (BYSTOLIC) 10 MG tablet Take 0.5 tablets (5 mg total) by mouth 2 (two) times daily. 10 tablet 0  . pyridOXINE (VITAMIN B-6) 100 MG tablet  Take 100 mg by mouth 2 (two) times daily.     . rosuvastatin (CRESTOR) 40 MG tablet Take 1 tablet (40 mg total) by mouth at bedtime. 90 tablet 3  . lisinopril (PRINIVIL,ZESTRIL) 10 MG tablet Take 1 tablet (10 mg total) by mouth daily. 90 tablet 3   No current facility-administered medications for this visit.     Social History   Social History  . Marital status: Divorced    Spouse name: N/A  . Number of children: 2  . Years of education: N/A   Occupational History  . Not on file.   Social History Main Topics  .  Smoking status: Never Smoker  . Smokeless tobacco: Never Used  . Alcohol use No  . Drug use: No  . Sexual activity: Not on file   Other Topics Concern  . Not on file   Social History Narrative   Work or School: none      Home Situation: lives alone, feels safe, son lives nearby      Spiritual Beliefs: Christian      Lifestyle: Y 3x per week, healthy diet         Social history is notable that she's divorced has 2 children one grandchild. She does exercise. There is no tobacco or alcohol use..  Family History  Problem Relation Age of Onset  . Hypertension Mother   . Diabetes Mother   . Hyperlipidemia Mother   . Heart attack Mother   . Heart disease Father   . Heart attack Father   . Stroke Brother   . Heart disease Brother   . Diabetes Brother   . Uterine cancer Maternal Grandmother     ROS General: Negative; No fevers, chills, or night sweats; No significant change in weight HEENT: Negative; No changes in vision or hearing, sinus congestion, difficulty swallowing Pulmonary: Negative; No cough, wheezing, shortness of breath, hemoptysis Cardiovascular: See history of present illness Previous leg swelling had resolved  GI: Negative; No nausea, vomiting, diarrhea, or abdominal pain GU: Negative; No dysuria, hematuria, or difficulty voiding Musculoskeletal: Negative; no myalgias, joint pain, or weakness Hematologic/Oncology: Negative; no easy  bruising, bleeding Endocrine: Positive for diabetes mellitus, positive for hyperparathyroidism Neuro: Negative; no changes in balance, headaches Skin: Negative; No rashes or skin lesions Psychiatric: Negative; No behavioral problems, depression Sleep: Negative; No snoring, daytime sleepiness, hypersomnolence, bruxism, restless legs, hypnogognic hallucinations, no cataplexy Other comprehensive 14 point system review is negative.   PE BP (!) 142/56   Pulse (!) 57   Ht 5' 4.5" (1.638 m)   Wt 192 lb (87.1 kg)   BMI 32.45 kg/m    Repeat blood pressure by me was 118/64  Wt Readings from Last 3 Encounters:  09/02/17 192 lb (87.1 kg)  06/08/17 192 lb 4.8 oz (87.2 kg)  05/25/17 196 lb (88.9 kg)   General: Alert, oriented, no distress.  Skin: normal turgor, no rashes, warm and dry HEENT: Normocephalic, atraumatic. Pupils equal round and reactive to light; sclera anicteric; extraocular muscles intact;  Nose without nasal septal hypertrophy Mouth/Parynx benign; Mallinpatti scale 3 Neck: No JVD, no carotid bruits; normal carotid upstroke Lungs: clear to ausculatation and percussion; no wheezing or rales Chest wall: without tenderness to palpitation Heart: PMI not displaced, RRR, s1 s2 normal, 2/6 systolic murmur, no diastolic murmur, no rubs, gallops, thrills, or heaves Abdomen: soft, nontender; no hepatosplenomehaly, BS+; abdominal aorta nontender and not dilated by palpation. Back: no CVA tenderness Pulses 2+ Musculoskeletal: full range of motion, normal strength, no joint deformities Extremities: no clubbing cyanosis or edema, Homan's sign negative  Neurologic: grossly nonfocal; Cranial nerves grossly wnl Psychologic: Normal mood and affect   ECG (independently read by me): Sinus bradycardia 57 bpm with first-degree AV block.  LVH by voltage criteria.  PR interval 270 ms.  May 2018 ECG (independently read by me): sinus rhythm with first-degree AV block.  LVH by voltage criteria.  PR  interval 250 ms.  April 2017 ECG (independently read by me): Normal sinus rhythm at 61 bpm with first-degree AV block with a PR interval of 244 ms.  LVH by voltage.  September 2016 ECG (independently read  by me): Normal sinus rhythm at 60 bpm.  First degree block with a PR interval at 252 ms.  No significant ST-T changes.  October 2015 ECG (independently read by me): Sinus bradycardia at 57 beats per minute.  First degree AV block with a PR interval of 236 ms   September 2014 ECG: Sinus rhythm at 60 beats per minute. First grade AV block with PR interval of 244 ms. QTC interval 356 ms  LABS: BMP Latest Ref Rng & Units 05/25/2017 01/28/2017 10/29/2016  Glucose 65 - 99 mg/dL 256(H) 133(H) 149(H)  BUN 6 - 20 mg/dL 32(H) 38(H) 36(H)  Creatinine 0.44 - 1.00 mg/dL 1.67(H) 1.63(H) 1.83(H)  Sodium 135 - 145 mmol/L 136 137 136  Potassium 3.5 - 5.1 mmol/L 4.1 4.3 4.4  Chloride 101 - 111 mmol/L 100(L) 104 101  CO2 22 - 32 mmol/L '30 27 28  '$ Calcium 8.9 - 10.3 mg/dL 10.9(H) 10.7(H) 10.6(H)   Hepatic Function Latest Ref Rng & Units 05/24/2014 01/23/2014 10/25/2013  Total Protein 6.0 - 8.3 g/dL 8.0 7.8 7.9  Albumin 3.5 - 5.2 g/dL 4.0 3.9 3.8  AST 0 - 37 U/L '25 23 26  '$ ALT 0 - 35 U/L '15 16 22  '$ Alk Phosphatase 39 - 117 U/L 44 43 46  Total Bilirubin 0.2 - 1.2 mg/dL 0.6 0.5 0.5  Bilirubin, Direct 0.0 - 0.3 mg/dL 0.1 0.0 0.0   CBC Latest Ref Rng & Units 05/25/2017 10/29/2016 07/02/2016  WBC 4.0 - 10.5 K/uL 6.8 8.1 7.7  Hemoglobin 12.0 - 15.0 g/dL 12.1 12.2 12.3  Hematocrit 36.0 - 46.0 % 36.3 36.2 36.5  Platelets 150 - 400 K/uL 203 198.0 191.0   Lab Results  Component Value Date   MCV 98.1 05/25/2017   MCV 95.7 10/29/2016   MCV 96.1 07/02/2016   Lab Results  Component Value Date   TSH 2.00 01/30/2016   Lab Results  Component Value Date   HGBA1C 8.0 (H) 01/28/2017   Lab Results  Component Value Date   CALCIUM 10.9 (H) 05/25/2017   Lipid Panel     Component Value Date/Time   CHOL 154 07/02/2016  0840   TRIG 70.0 07/02/2016 0840   HDL 46.20 07/02/2016 0840   CHOLHDL 3 07/02/2016 0840   VLDL 14.0 07/02/2016 0840   LDLCALC 93 07/02/2016 0840    RADIOLOGY: No results found.  IMPRESSION:  1. Mild aortic stenosis   2. Essential hypertension   3. Hyperlipidemia, unspecified hyperlipidemia type   4. Type 2 diabetes mellitus with microalbuminuria, with long-term current use of insulin (HCC)   5. First degree AV block     ASSESSMENT AND PLAN: Ms. Rockers is a 83 year old African-American female who has a long-standing history of hypertension with grade 1 diastolic dysfunction and normal systolic function documented by her echocardiogram in September 2011. At that time she had aortic valve sclerosis without stenosis and  mitral annular calcification with trace MR and trace TR. Her echo Doppler study from 08/23/2015 showed an ejection fraction of 55-60%, mild LVH and grade 1 diastolic dysfunction.  There was very mild aortic stenosis with a mean gradient of 10 and peak gradient of 22.  I reviewed her 2 year follow-up echo Doppler study which was completed on 08/09/2017.  This continued to show hyperdynamic LV function with grade 1 diastolic dysfunction.  There was slight increase in her mean and peak gradients, now 16 and 30 mm, respectively, but the aortic stenosis was still in the mild range.  Clinically, she  is doing well.  Her blood pressure is stable on her multiple medical regimen as noted above.  I am recommending she decrease her furosemide to 20 mg daily rather than 40 mg with her renal insufficiency and resolution of prior edema, but she can take an extra 20 mg as needed if edema develops.  ECG reveals sinus bradycardia with first-degree AV block.  She continues to be on Crestor 40 mg for hyperlipidemia with target LDL less than 70.  She will be seeing [name] in January, who is monitoring her diabetic regimen.  Her most recent hemoglobin A1c was elevated at 7.6.  She also sees Dr.  Jimmy Footman for nephrology.  Clinically, as long as she remains stable, I will see her in one year for cardiology reevaluation.  Time spent: 25 minutes  Troy Sine, MD, [facility]  09/04/2017 3:47 PM

## 2017-09-02 NOTE — Patient Instructions (Signed)
Medication Instructions:  Take 20mg  furosemide (Lasix) daily --take additional 20 mg as needed  Testing/Procedures: Your physician has requested that you have an echocardiogram in 1 YEAR. Echocardiography is a painless test that uses sound waves to create images of your heart. It provides your doctor with information about the size and shape of your heart and how well your heart's chambers and valves are working. This procedure takes approximately one hour. There are no restrictions for this procedure. This will be done at our [facility] location:  [place]: Your physician wants you to follow-up in: 12 MONTHS with Dr. Claiborne Billings (after echo) You will receive a reminder letter in the mail two months in advance. If you don't receive a letter, please call our office to schedule the follow-up appointment.   Any Other Special Instructions Will Be Listed Below (If Applicable).     If you need a refill on your cardiac medications before your next appointment, please call your pharmacy.

## 2017-09-03 ENCOUNTER — Other Ambulatory Visit: Payer: Self-pay | Admitting: *Deleted

## 2017-09-03 MED ORDER — LISINOPRIL 10 MG PO TABS: 10.0000 mg | ORAL_TABLET | Freq: Every day | ORAL | 3 refills | Status: DC

## 2017-10-20 ENCOUNTER — Telehealth: Payer: Self-pay

## 2017-10-20 NOTE — Telephone Encounter (Signed)
Call to Ms. Bonnie Chavez to fup on her AWV. Left VM on mobile and home phone regarding my schedule change and she will need to reschedule the AWV with [name] on a Tuesday or Friday after 12/7th/. She will see Dr. Maudie Mercury on the 13th as planned. I am no longer in the [facility] on Thursday. Wynetta Fines RN

## 2017-10-21 NOTE — Telephone Encounter (Signed)
Returned Ms. Bagot's call. Scheduled apt for Dec 14 th at 1pm for [place]

## 2017-11-03 NOTE — Progress Notes (Signed)
HPI:  Here for CPE:  -Concerns and/or follow up today: Due for labs. Seeing Bonnie Chavez tomorrow for AWV. Reports she is doing well.  [facility]  Feels that she is eating a healthy diet.  She recently got to go to her son's wedding [facility] and really enjoyed this.  No complaints today.  She lasted her labs with her nephrologist in August.  Her blood sugars have ranged in the low 100s fasting in the morning.  Denies low blood sugars.  Her cardiologist decreased her Lasix dose, she has a little more swelling, but less lightheadedness with standing on the lower dose.  She takes extra she notices increased swelling.  CdCHF/HLD/aortic valve sclerosis/Obesity: -sees cardiologist (Dr. Claiborne Billings) -meds:amlodipine 10, crestor 40, lasix 20, lisinopril 10, nebivolol 5, asa -exerc[facility] 3x per [facility] in water aerobics  DM, Hyperparathyroidism: -sees endocriologist (Dr. Chalmers Cater) -meds: insulin- humulin n 14 units bid -[facility] exam with [facility] care 02/2016  CKD/vit D def: -sees nephrology ([name]) -cr ~1.7 06/2017  HRT for Hot Flashes, Uterine Polyps: -sees gynecologist ([name]) -s/p D and C recently with her gynecologist  DDD/Lumbar radiculopathy/CTS: -seeing [place] [facility]  -Diet: variety of foods, balance and well rounded, larger portion sizes -Exercise: no regular exercise -Taking folic acid, vitamin D or calcium: no -Diabetes and Dyslipidemia Screening: due for labs -Vaccines: see vaccine section EPIC -pap history:n/a - sees gyn, [name] -FDLMP: see nursing notes -sexual activity: yes, female partner, no new partners -wants STI testing (Hep C if born [birth year]-65): no -FH breast, colon or ovarian ca: see FH Last mammogram: 2016 per chart Last colon cancer screening: colonoscopy 2013, sees [name] tomorrow for preventive visit Breast Ca Risk Assessment: see family history and pt history DEXA (>/= 3): 2014 - she will discuss at [facility] tomorrow with  [name]  -Alcohol, Tobacco, drug use: see social history  Review of Systems - no fevers, unintentional weight loss, vision loss, hearing loss, chest pain, sob, hemoptysis, melena, hematochezia, hematuria, genital discharge, changing or concerning skin lesions, bleeding, bruising, loc, thoughts of self harm or SI  Past Medical History:  Diagnosis Date  . Anemia    History of , resolved  . Aortic valve sclerosis   . Carpal tunnel syndrome   . Chicken pox   . Chronic low back pain 02/27/2016   -DDD -seeing [place] [facility]   . Diabetes mellitus    type 2, sees Dr. Chalmers Cater  . Goiter    Dr. Chalmers Cater  . Heart murmur   . History of nuclear stress test 06/2010   dipyridamole; normal pattern of perfusion; ekg negative for ischemia; low risk scan   . Hot flashes   . Hyperlipidemia   . Hyperparathyroidism ([place]) 02/27/2016   -sees endocrinologist, Dr. Chalmers Cater, has seen surgeon as well   . Hypertension   . Migraine    no longer has migraines (80's)  . Mitral valve prolapse   . Neuropathy   . Obesity 02/27/2016  . Osteoarthritis    DDD, sees [place] [facility]  . PMB (postmenopausal bleeding) 02/28/2009   Resolved  . Pneumonia    history of   . Renal insufficiency    [name]   . Vaginal atrophy   . Vitamin D deficiency     Past Surgical History:  Procedure Laterality Date  . [place] &1976  . CHOLECYSTECTOMY  1991  . COLONOSCOPY    . DILATATION & CURRETTAGE/HYSTEROSCOPY WITH RESECTOCOPE N/A 05/15/2013  Procedure: [place];  Surgeon: Eldred Manges, MD;  Location: [facility];  Service: Gynecology;  Laterality: N/A;  . DILATATION & CURRETTAGE/HYSTEROSCOPY WITH RESECTOCOPE N/A 06/03/2017   Procedure: DILATATION & CURETTAGE/HYSTEROSCOPY;  Surgeon: Eldred Manges, MD;  Location: [facility];  Service: Gynecology;  Laterality: N/A;  . DILATION AND CURETTAGE OF UTERUS  1975  . [place]  . KNEE SURGERY  2000 2011   right knee  replacement   . REPLACEMENT TOTAL KNEE  2000   left  . SHOULDER SURGERY  2004 2010   . TRANSTHORACIC ECHOCARDIOGRAM  07/2010   EF=>55%; LA mildly dilated; trace MR/TR;  . TUBAL LIGATION  1976    Family History  Problem Relation Age of Onset  . Hypertension Mother   . Diabetes Mother   . Hyperlipidemia Mother   . Heart attack Mother   . Heart disease Father   . Heart attack Father   . Stroke Brother   . Heart disease Brother   . Diabetes Brother   . Uterine cancer Maternal Grandmother     Social History   Socioeconomic History  . Marital status: Divorced    Spouse name: None  . Number of children: 2  . Years of education: None  . Highest education level: None  Social Needs  . Financial resource strain: None  . Food insecurity - worry: None  . Food insecurity - inability: None  . Transportation needs - medical: None  . Transportation needs - non-medical: None  Occupational History  . None  Tobacco Use  . Smoking status: Never Smoker  . Smokeless tobacco: Never Used  Substance and Sexual Activity  . Alcohol use: No    Alcohol/week: 0.0 oz  . Drug use: No  . Sexual activity: None  Other Topics Concern  . None  Social History Narrative   Work or School: none      Home Situation: lives alone, feels safe, son lives nearby      Spiritual Beliefs: Christian      Lifestyle: [facility] 3x per week, healthy diet        Current Outpatient Medications:  .  acetaminophen (TYLENOL) 500 MG tablet, Take 1 tablet (500 mg total) by mouth every 4 (four) hours as needed for moderate pain., Disp: 60 tablet, Rfl: 0 .  amLODipine (NORVASC) 10 MG tablet, Take 1 tablet (10 mg total) by mouth at bedtime., Disp: 180 tablet, Rfl: 0 .  aspirin (ECOTRIN LOW STRENGTH) 81 MG EC tablet, Take 81 mg by mouth at bedtime. Swallow whole. , Disp: , Rfl:  .  cephALEXin (KEFLEX) 500 MG capsule, Take 500-1,000 mg by mouth See admin instructions. TAKE ONLY FOR INVASIVE DENTAL PROCEDURES, Disp: , Rfl:  .   furosemide (LASIX) 20 MG tablet, Take 20 mg by mouth daily. Can take additional tablet if needed, Disp: , Rfl:  .  gabapentin (NEURONTIN) 100 MG capsule, Take 100 mg by mouth at bedtime., Disp: , Rfl:  .  HUMULIN N KWIKPEN 100 UNIT/ML Kiwkpen, Inject 14 Units into the skin at bedtime. , Disp: , Rfl: 6 .  lisinopril (PRINIVIL,ZESTRIL) 10 MG tablet, Take 1 tablet (10 mg total) by mouth daily., Disp: 90 tablet, Rfl: 3 .  nebivolol (BYSTOLIC) 10 MG tablet, Take 0.5 tablets (5 mg total) by mouth 2 (two) times daily., Disp: 10 tablet, Rfl: 0 .  pyridOXINE (VITAMIN B-6) 100 MG tablet, Take 100 mg by mouth 2 (two) times daily. , Disp: , Rfl:  .  rosuvastatin (CRESTOR) 40 MG tablet, Take 1 tablet (40 mg total) by mouth at bedtime., Disp: 90 tablet, Rfl: 3  EXAM:  Vitals:   11/04/17 0713  BP: (!) 132/52  Pulse: 62  Temp: 97.8 F (36.6 C)   Body mass index is 33.61 kg/m.  GENERAL: vitals reviewed and listed below, alert, oriented, appears well hydrated and in no acute distress  HEENT: head atraumatic, PERRLA, normal appearance of eyes, ears, nose and mouth. moist mucus membranes.  NECK: supple, no masses or lymphadenopathy  LUNGS: clear to auscultation bilaterally, no rales, rhonchi or wheeze  CV: HRRR, no peripheral edema or cyanosis, normal pedal pulses  ABDOMEN: bowel sounds normal, soft, non tender to palpation, no masses, no rebound or guarding  GU/BREAST: Declined, does with her gynecologist  SKIN: no rash or abnormal lesions, several skin tags, one skin tag versus cutaneous horn on the left wrist  MS: normal gait, moves all extremities normally  NEURO: normal gait, speech and thought processing grossly intact, muscle tone grossly intact throughout  PSYCH: normal affect, pleasant and cooperative  ASSESSMENT AND PLAN:  Discussed the following assessment and plan:  1. Encounter for preventive health examination -Discussed briefly and advised all [place] preventive services health  task force level A and B recommendations for age, sex and risks. She plans to review at her AWV with [name] tomorrow. -Advised at least 150 minutes of exercise per week and a healthy diet with avoidance of (less then 1 serving per week) processed foods, white starches, red meat, fast foods and sweets and consisting of: * 5-9 servings of fresh fruits and vegetables (not corn or potatoes) *nuts and seeds, beans *olives and olive oil *lean meats such as fish and white chicken  *whole grains -FASTIN labs, studies and vaccines per orders this encounter   2. Screening for depression - negative  3. Type 2 diabetes mellitus with chronic kidney disease, with long-term current use of insulin, unspecified CKD stage (HCC) - Hemoglobin A1c - advised assistant to fax results to Dr. Chalmers Cater her managing endocrinologist -foot exam done today - advise she schedule with her podiatrist for nail filing  4. Hyperparathyroidism [facility]) -Sees endocrinologist for management  5. Hypertension associated with diabetes ([place]) -Acceptable, sees cardiologist for management - Basic metabolic panel - CBC  6. Hyperlipidemia associated with type 2 diabetes mellitus ([place]) - Lipid panel  7. Morbid obesity ([place]) -Lifestyle recommendations, I am glad that she continues to stay active  8. Chronic diastolic congestive heart failure ([place]) -See his cardiologist for management  9. CKD -Sees nephrologist for management, will ask assistant to send labs to specialist  Patient advised to return to clinic immediately if symptoms worsen or persist or new concerns.  Patient Instructions  BEFORE YOU LEAVE: -labs -follow up:  1)TOMORROW with susan as scheduled for AWV 2) 3 months - Procedure visit (30 minutes) for removal lesion and follow up  We have ordered labs or studies at this visit. It can take up to 1-2 weeks for results and processing. IF results require follow up or explanation, we will call you with instructions.  Clinically stable results will be released to your [facility]. If you have not heard from [place] or cannot find your results in [facility] in 2 weeks please contact our office at [phone].  If you are not yet signed up for [facility], please consider signing up.   Health Maintenance for Postmenopausal Women Menopause is a normal process in which your reproductive ability comes to an end. This  process happens gradually over a span of months to years, usually between the ages of 87 and 11. Menopause is complete when you have missed 12 consecutive menstrual periods. It is important to talk with your health care provider about some of the most common conditions that affect postmenopausal women, such as heart disease, cancer, and bone loss (osteoporosis). Adopting a healthy lifestyle and getting preventive care can help to promote your health and wellness. Those actions can also lower your chances of developing some of these common conditions. What should I know about menopause? During menopause, you may experience a number of symptoms, such as:  Moderate-to-severe hot flashes.  Night sweats.  Decrease in sex drive.  Mood swings.  Headaches.  Tiredness.  Irritability.  Memory problems.  Insomnia.  Choosing to treat or not to treat menopausal changes is an individual decision that you make with your health care provider. What should I know about hormone replacement therapy and supplements? Hormone therapy products are effective for treating symptoms that are associated with menopause, such as hot flashes and night sweats. Hormone replacement carries certain risks, especially as you become older. If you are thinking about using estrogen or estrogen with progestin treatments, discuss the benefits and risks with your health care provider. What should I know about heart disease and stroke? Heart disease, heart attack, and stroke become more likely as you age. This may be due, in part, to the hormonal  changes that your body experiences during menopause. These can affect how your body processes dietary fats, triglycerides, and cholesterol. Heart attack and stroke are both medical emergencies. There are many things that you can do to help prevent heart disease and stroke:  Have your blood pressure checked at least every 1-2 years. High blood pressure causes heart disease and increases the risk of stroke.  If you are 33-45 years old, ask your health care provider if you should take aspirin to prevent a heart attack or a stroke.  Do not use any tobacco products, including cigarettes, chewing tobacco, or electronic cigarettes. If you need help quitting, ask your health care provider.  It is important to eat a healthy diet and maintain a healthy weight. ? Be sure to include plenty of vegetables, fruits, low-fat dairy products, and lean protein. ? Avoid eating foods that are high in solid fats, added sugars, or salt (sodium).  Get regular exercise. This is one of the most important things that you can do for your health. ? Try to exercise for at least 150 minutes each week. The type of exercise that you do should increase your heart rate and make you sweat. This is known as moderate-intensity exercise. ? Try to do strengthening exercises at least twice each week. Do these in addition to the moderate-intensity exercise.  Know your numbers.Ask your health care provider to check your cholesterol and your blood glucose. Continue to have your blood tested as directed by your health care provider.  What should I know about cancer screening? There are several types of cancer. Take the following steps to reduce your risk and to catch any cancer development as early as possible. Breast Cancer  Practice breast self-awareness. ? This means understanding how your breasts normally appear and feel. ? It also means doing regular breast self-exams. Let your health care provider know about any changes, no  matter how small.  If you are 24 or older, have a clinician do a breast exam (clinical breast exam or CBE) every year. Depending on your  age, family history, and medical history, it may be recommended that you also have a yearly breast X-ray (mammogram).  If you have a family history of breast cancer, talk with your health care provider about genetic screening.  If you are at high risk for breast cancer, talk with your health care provider about having an MRI and a mammogram every year.  Breast cancer (BRCA) gene test is recommended for women who have family members with BRCA-related cancers. Results of the assessment will determine the need for genetic counseling and BRCA1 and for BRCA2 testing. BRCA-related cancers include these types: ? Breast. This occurs in males or females. ? Ovarian. ? Tubal. This may also be called fallopian tube cancer. ? Cancer of the abdominal or pelvic lining (peritoneal cancer). ? Prostate. ? Pancreatic.  Cervical, Uterine, and Ovarian Cancer Your health care provider may recommend that you be screened regularly for cancer of the pelvic organs. These include your ovaries, uterus, and vagina. This screening involves a pelvic exam, which includes checking for microscopic changes to the surface of your cervix (Pap test).  For women ages 21-65, health care providers may recommend a pelvic exam and a Pap test every three years. For women ages 40-65, they may recommend the Pap test and pelvic exam, combined with testing for human papilloma virus (HPV), every five years. Some types of HPV increase your risk of cervical cancer. Testing for HPV may also be done on women of any age who have unclear Pap test results.  Other health care providers may not recommend any screening for nonpregnant women who are considered low risk for pelvic cancer and have no symptoms. Ask your health care provider if a screening pelvic exam is right for you.  If you have had past treatment for  cervical cancer or a condition that could lead to cancer, you need Pap tests and screening for cancer for at least 20 years after your treatment. If Pap tests have been discontinued for you, your risk factors (such as having a new sexual partner) need to be reassessed to determine if you should start having screenings again. Some women have medical problems that increase the chance of getting cervical cancer. In these cases, your health care provider may recommend that you have screening and Pap tests more often.  If you have a family history of uterine cancer or ovarian cancer, talk with your health care provider about genetic screening.  If you have vaginal bleeding after reaching menopause, tell your health care provider.  There are currently no reliable tests available to screen for ovarian cancer.  Lung Cancer Lung cancer screening is recommended for adults 73-65 years old who are at high risk for lung cancer because of a history of smoking. A yearly low-dose CT scan of the lungs is recommended if you:  Currently smoke.  Have a history of at least 30 pack-years of smoking and you currently smoke or have quit within the past 15 years. A pack-year is smoking an average of one pack of cigarettes per day for one year.  Yearly screening should:  Continue until it has been 15 years since you quit.  Stop if you develop a health problem that would prevent you from having lung cancer treatment.  Colorectal Cancer  This type of cancer can be detected and can often be prevented.  Routine colorectal cancer screening usually begins at age 25 and continues through age 36.  If you have risk factors for colon cancer, your health care provider  may recommend that you be screened at an earlier age.  If you have a family history of colorectal cancer, talk with your health care provider about genetic screening.  Your health care provider may also recommend using home test kits to check for hidden  blood in your stool.  A small camera at the end of a tube can be used to examine your colon directly (sigmoidoscopy or colonoscopy). This is done to check for the earliest forms of colorectal cancer.  Direct examination of the colon should be repeated every 5-10 years until age 25. However, if early forms of precancerous polyps or small growths are found or if you have a family history or genetic risk for colorectal cancer, you may need to be screened more often.  Skin Cancer  Check your skin from head to toe regularly.  Monitor any moles. Be sure to tell your health care provider: ? About any new moles or changes in moles, especially if there is a change in a mole's shape or color. ? If you have a mole that is larger than the size of a pencil eraser.  If any of your family members has a history of skin cancer, especially at a young age, talk with your health care provider about genetic screening.  Always use sunscreen. Apply sunscreen liberally and repeatedly throughout the day.  Whenever you are outside, protect yourself by wearing long sleeves, pants, a wide-brimmed hat, and sunglasses.  What should I know about osteoporosis? Osteoporosis is a condition in which bone destruction happens more quickly than new bone creation. After menopause, you may be at an increased risk for osteoporosis. To help prevent osteoporosis or the bone fractures that can happen because of osteoporosis, the following is recommended:  If you are 39-29 years old, get at least 1,000 mg of calcium and at least 600 mg of vitamin D per day.  If you are older than age 61 but younger than age 23, get at least 1,200 mg of calcium and at least 600 mg of vitamin D per day.  If you are older than age 11, get at least 1,200 mg of calcium and at least 800 mg of vitamin D per day.  Smoking and excessive alcohol intake increase the risk of osteoporosis. Eat foods that are rich in calcium and vitamin D, and do weight-bearing  exercises several times each week as directed by your health care provider. What should I know about how menopause affects my mental health? Depression may occur at any age, but it is more common as you become older. Common symptoms of depression include:  Low or sad mood.  Changes in sleep patterns.  Changes in appetite or eating patterns.  Feeling an overall lack of motivation or enjoyment of activities that you previously enjoyed.  Frequent crying spells.  Talk with your health care provider if you think that you are experiencing depression. What should I know about immunizations? It is important that you get and maintain your immunizations. These include:  Tetanus, diphtheria, and pertussis (Tdap) booster vaccine.  Influenza every year before the flu season begins.  Pneumonia vaccine.  Shingles vaccine.  Your health care provider may also recommend other immunizations. This information is not intended to replace advice given to you by your health care provider. Make sure you discuss any questions you have with your health care provider. Document Released: 01/01/2006 Document Revised: 05/29/2016 Document Reviewed: 08/13/2015 Elsevier Interactive Patient Education  2018 [employer].  No Follow-up on file.  Colin Benton R., DO

## 2017-11-03 NOTE — Progress Notes (Signed)
Subjective:   Bonnie Chavez is a 83 y.o. female who presents for Medicare Annual (Subsequent) preventive examination.  Diet Glucose was 8 yesterday;  BSs are good at home    Breakfast oatmeal and [place] sausage Banana 1/2 but no all the time Lunch; protein and vegetable Supper goes light at supper Chicken sandwich   Exercise HDL 46 2017 / Now up to 55 on labs drawn yesterday [place] at the [employer]; walks in the pool-15 to 20  Minutes and then does other exercises    No falls;  Has OA and will use her can once in awhile Slipped disc bothers her but ice helps    BMI 33   Hearing Screening Comments: Had hearing aids  Vision Screening Comments: Vision; March and was good Dr. Delman Cheadle  Eye exam completed 01/2017; no Diabetic retinopathy  Last seen by Dr. Maudie Mercury 12/13 Diabetes followed by Dr. Chalmers Cater  Humulin 14 u at hs Was on Tradjenta  BS WNL in the am and pm over the last few weeks. States Dr. Chalmers Cater very happy with the result at this time    Cardiac Risk Factors include: advanced age (>37men, >73 women);diabetes mellitus;dyslipidemia;family history of premature cardiovascular disease;hypertension;obesity (BMI >30kg/m2) There are no preventive care reminders to display for this patient.     Spent Thanksgiving with son and grand son in [place]. Son was married Saturday    Objective:     Vitals: BP (!) 118/58   Pulse 63   Ht 5\' 4"  (1.626 m)   Wt 195 lb 12 oz (88.8 kg)   SpO2 98%   BMI 33.60 kg/m   Body mass index is 33.6 kg/m.  Advanced Directives 11/05/2017 05/25/2017 10/29/2016 08/14/2016 02/28/2015 05/10/2013  Does Patient Have a Medical Advance Directive? Yes Yes Yes No Yes Patient has advance directive, copy not in chart  Type of Advance Directive - [place];Living will - - [place];Living will Living will;Healthcare Power of Attorney  Does patient want to make changes to medical advance directive? - No - Patient declined  - - No - Patient declined -  Copy of [place] in Chart? - No - copy requested - - No - copy requested -  Would patient like information on creating a medical advance directive? - - - No - patient declined information - -    Tobacco Social History   Tobacco Use  Smoking Status Never Smoker  Smokeless Tobacco Never Used     Counseling given: Yes        Past Medical History:  Diagnosis Date  . Anemia    History of , resolved  . Aortic valve sclerosis   . Carpal tunnel syndrome   . Chicken pox   . Chronic low back pain 02/27/2016   -DDD -seeing [facility]   . Diabetes mellitus    type 2, sees Dr. Chalmers Cater  . Goiter    Dr. Chalmers Cater  . Heart murmur   . History of nuclear stress test 06/2010   dipyridamole; normal pattern of perfusion; ekg negative for ischemia; low risk scan   . Hot flashes   . Hyperlipidemia   . Hyperparathyroidism ([place]) 02/27/2016   -sees endocrinologist, Dr. Chalmers Cater, has seen surgeon as well   . Hypertension   . Migraine    no longer has migraines (80's)  . Mitral valve prolapse   . Neuropathy   . Obesity 02/27/2016  . Osteoarthritis    DDD, sees  [facility]  . PMB (postmenopausal bleeding) 02/28/2009   Resolved  . Pneumonia    history of   . Renal insufficiency    [name]   . Vaginal atrophy   . Vitamin D deficiency    Past Surgical History:  Procedure Laterality Date  . [place] &1976  . CHOLECYSTECTOMY  1991  . COLONOSCOPY    . DILATATION & CURRETTAGE/HYSTEROSCOPY WITH RESECTOCOPE N/A 05/15/2013   Procedure: [place];  Surgeon: Eldred Manges, MD;  Location: [facility];  Service: Gynecology;  Laterality: N/A;  . DILATATION & CURRETTAGE/HYSTEROSCOPY WITH RESECTOCOPE N/A 06/03/2017   Procedure: DILATATION & CURETTAGE/HYSTEROSCOPY;  Surgeon: Eldred Manges, MD;  Location: [facility];  Service: Gynecology;  Laterality: N/A;  . DILATION AND CURETTAGE OF UTERUS  1975    . [place]  . KNEE SURGERY  2000 2011   right knee replacement   . REPLACEMENT TOTAL KNEE  2000   left  . SHOULDER SURGERY  2004 2010   . TRANSTHORACIC ECHOCARDIOGRAM  07/2010   EF=>55%; LA mildly dilated; trace MR/TR;  . TUBAL LIGATION  1976   Family History  Problem Relation Age of Onset  . Hypertension Mother   . Diabetes Mother   . Hyperlipidemia Mother   . Heart attack Mother   . Heart disease Father   . Heart attack Father   . Stroke Brother   . Heart disease Brother   . Diabetes Brother   . Uterine cancer Maternal Grandmother    Social History   Socioeconomic History  . Marital status: Divorced    Spouse name: Not on file  . Number of children: 2  . Years of education: Not on file  . Highest education level: Not on file  Social Needs  . Financial resource strain: Not on file  . Food insecurity - worry: Not on file  . Food insecurity - inability: Not on file  . Transportation needs - medical: Not on file  . Transportation needs - non-medical: Not on file  Occupational History  . Not on file  Tobacco Use  . Smoking status: Never Smoker  . Smokeless tobacco: Never Used  Substance and Sexual Activity  . Alcohol use: No    Alcohol/week: 0.0 oz  . Drug use: No  . Sexual activity: Not on file  Other Topics Concern  . Not on file  Social History Narrative   Work or School: none      Home Situation: lives alone, feels safe, son lives nearby      Spiritual Beliefs: Christian      Lifestyle: Y 3x per week, healthy diet       Outpatient Encounter Medications as of 11/05/2017  Medication Sig  . acetaminophen (TYLENOL) 500 MG tablet Take 1 tablet (500 mg total) by mouth every 4 (four) hours as needed for moderate pain.  [name] Kitchen amLODipine (NORVASC) 10 MG tablet Take 1 tablet (10 mg total) by mouth at bedtime.  [name] Kitchen aspirin (ECOTRIN LOW STRENGTH) 81 MG EC tablet Take 81 mg by mouth at bedtime. Swallow whole.   . cephALEXin (KEFLEX) 500 MG capsule Take  500-1,000 mg by mouth See admin instructions. TAKE ONLY FOR INVASIVE DENTAL PROCEDURES  . furosemide (LASIX) 20 MG tablet Take 20 mg by mouth daily. Can take additional tablet if needed  . gabapentin (NEURONTIN) 100 MG capsule Take 100 mg by mouth at bedtime.  [name] Kitchen HUMULIN N KWIKPEN 100 UNIT/ML Kiwkpen Inject 14  Units into the skin at bedtime.   [name] Kitchen lisinopril (PRINIVIL,ZESTRIL) 10 MG tablet Take 1 tablet (10 mg total) by mouth daily.  . nebivolol (BYSTOLIC) 10 MG tablet Take 0.5 tablets (5 mg total) by mouth 2 (two) times daily.  [name] Kitchen pyridOXINE (VITAMIN B-6) 100 MG tablet Take 100 mg by mouth 2 (two) times daily.   . rosuvastatin (CRESTOR) 40 MG tablet Take 1 tablet (40 mg total) by mouth at bedtime.   No facility-administered encounter medications on file as of 11/05/2017.     Activities of Daily Living In your present state of health, do you have any difficulty performing the following activities: 11/05/2017 05/25/2017  Hearing? N Y  Vision? N N  Difficulty concentrating or making decisions? N N  Walking or climbing stairs? N Y  Comment - notices she gets tired easy  Dressing or bathing? N N  Doing errands, shopping? N N  Preparing Food and eating ? N -  Using the Toilet? N -  In the past six months, have you accidently leaked urine? N -  Do you have problems with loss of bowel control? N -  Managing your Medications? N -  Managing your Finances? N -  Housekeeping or managing your Housekeeping? N -  Some recent data might be hidden     Patient Care Team: Lucretia Kern, DO as PCP - General (Family Medicine) Jacelyn Pi, MD as Consulting Physician (Endocrinology) Deterding, Jeneen Rinks, MD as Consulting Physician (Nephrology)  Dr. Delman Cheadle for vision checks  [name] for GYN     Assessment:     Exercise Activities and Dietary recommendations Current Exercise Habits: Structured exercise class, Time (Minutes): 60, Intensity: Moderate  Goals    . Exercise 150 min/wk Moderate Activity      Continues exercise at the y x 3 per week       Fall Risk Fall Risk  11/05/2017 11/04/2017 10/29/2016 07/28/2016 07/02/2016  Falls in the past year? No No No No No  Comment - - - Emmi Telephone Survey: data to providers prior to load -   Is the patient's home free of loose throw rugs in walkways, pet beds, electrical cords, etc?  One level w no pets      Grab bars in the bathroom? No on the tub;       Handrails on the stairs - no stairs       Adequate lighting - is good  Given resource to assist with grab bars as needed   Depression Screen PHQ 2/9 Scores 11/05/2017 11/04/2017 10/29/2016 07/02/2016  PHQ - 2 Score 0 0 0 0     Cognitive Function MMSE - Mini Mental State Exam 11/05/2017 10/29/2016  Not completed: (No Data) (No Data)     Ad8 score reviewed for issues:  Issues making decisions:  Less interest in hobbies / activities:  Repeats questions, stories (family complaining):  Trouble using ordinary gadgets (microwave, computer, phone):  Forgets the month or year:   Mismanaging finances:   Remembering appts:  Daily problems with thinking and/or memory: Ad8 score is=0     Immunization History  Administered Date(s) Administered  . Influenza Whole 08/23/2012  . Influenza, High Dose Seasonal PF 08/25/2016  . Influenza-Unspecified 08/26/2015, 08/25/2016, 08/19/2017  . Pneumococcal Conjugate-13 02/28/2015  . Pneumococcal Polysaccharide-23 07/25/2011   Screening Tests Health Maintenance  Topic Date Due  . TETANUS/TDAP  02/27/2025 (Originally 03/25/1953)  . OPHTHALMOLOGY EXAM  02/02/2018  . HEMOGLOBIN A1C  05/05/2018  . FOOT EXAM  11/04/2018  . INFLUENZA VACCINE  Completed  . DEXA SCAN  Completed  . PNA vac Low Risk Adult  Completed   Cancer Screenings: Lung: never smoked  Breast:   Up to date of Bone Density/Dexa in 2014;  Colorectal: aged out ( 01/2012)  Mammogram 04/2015- July 2018  Was due this summer; but had bleeding so she did not fup     Plan:       PCP Notes   Health Maintenance Educated regarding Shingrix Given resource for hearing aids but her insurance did pay for her   Abnormal Screens  No; states renal function is stable   Referrals  Not today   Patient concerns; None - feels good   Nurse Concerns; As noted   Next PCP apt Was yesterday; apt scheduled for 02/07/1018     I have personally reviewed and noted the following in the patient's chart:   . Medical and social history . Use of alcohol, tobacco or illicit drugs  . Current medications and supplements . Functional ability and status . Nutritional status . Physical activity . Advanced directives . List of other physicians . Hospitalizations, surgeries, and ER visits in previous 12 months . Vitals . Screenings to include cognitive, depression, and falls . Referrals and appointments  In addition, I have reviewed and discussed with patient certain preventive protocols, quality metrics, and best practice recommendations. A written personalized care plan for preventive services as well as general preventive health recommendations were provided to patient.     Wynetta Fines, RN  11/05/2017

## 2017-11-04 ENCOUNTER — Encounter: Payer: Self-pay | Admitting: Family Medicine

## 2017-11-04 ENCOUNTER — Ambulatory Visit (INDEPENDENT_AMBULATORY_CARE_PROVIDER_SITE_OTHER): Payer: Medicare Other | Admitting: Family Medicine

## 2017-11-04 VITALS — BP 132/52 | HR 62 | Temp 97.8°F | Ht 64.0 in | Wt 195.8 lb

## 2017-11-04 DIAGNOSIS — Z1331 Encounter for screening for depression: Secondary | ICD-10-CM

## 2017-11-04 DIAGNOSIS — E1169 Type 2 diabetes mellitus with other specified complication: Secondary | ICD-10-CM | POA: Diagnosis not present

## 2017-11-04 DIAGNOSIS — E213 Hyperparathyroidism, unspecified: Secondary | ICD-10-CM | POA: Diagnosis not present

## 2017-11-04 DIAGNOSIS — E1122 Type 2 diabetes mellitus with diabetic chronic kidney disease: Secondary | ICD-10-CM

## 2017-11-04 DIAGNOSIS — N183 Chronic kidney disease, stage 3 unspecified: Secondary | ICD-10-CM

## 2017-11-04 DIAGNOSIS — Z794 Long term (current) use of insulin: Secondary | ICD-10-CM | POA: Diagnosis not present

## 2017-11-04 DIAGNOSIS — Z0000 Encounter for general adult medical examination without abnormal findings: Secondary | ICD-10-CM

## 2017-11-04 DIAGNOSIS — E1159 Type 2 diabetes mellitus with other circulatory complications: Secondary | ICD-10-CM

## 2017-11-04 DIAGNOSIS — I5032 Chronic diastolic (congestive) heart failure: Secondary | ICD-10-CM

## 2017-11-04 DIAGNOSIS — E785 Hyperlipidemia, unspecified: Secondary | ICD-10-CM

## 2017-11-04 DIAGNOSIS — I152 Hypertension secondary to endocrine disorders: Secondary | ICD-10-CM

## 2017-11-04 DIAGNOSIS — I10 Essential (primary) hypertension: Secondary | ICD-10-CM

## 2017-11-04 LAB — LIPID PANEL
CHOL/HDL RATIO: 3
Cholesterol: 168 mg/dL (ref 0–200)
HDL: 55.5 mg/dL (ref >39.00)
LDL Cholesterol: 95 mg/dL (ref 0–99)
NONHDL: 112.87
Triglycerides: 90 mg/dL (ref 0.0–149.0)
VLDL: 18 mg/dL (ref 0.0–40.0)

## 2017-11-04 LAB — BASIC METABOLIC PANEL
BUN: 36 mg/dL — AB (ref 6–23)
CHLORIDE: 103 meq/L (ref 96–112)
CO2: 30 mEq/L (ref 19–32)
Calcium: 10.8 mg/dL — ABNORMAL HIGH (ref 8.4–10.5)
Creatinine, Ser: 1.67 mg/dL — ABNORMAL HIGH (ref 0.40–1.20)
GFR: 37.81 mL/min — ABNORMAL LOW (ref >60.00)
GLUCOSE: 138 mg/dL — AB (ref 70–99)
POTASSIUM: 4.4 meq/L (ref 3.5–5.1)
Sodium: 140 mEq/L (ref 135–145)

## 2017-11-04 LAB — CBC
HEMATOCRIT: 36.8 % (ref 36.0–46.0)
HEMOGLOBIN: 12.3 g/dL (ref 12.0–15.0)
MCHC: 33.5 g/dL (ref 30.0–36.0)
MCV: 98.8 fl (ref 78.0–100.0)
Platelets: 207 10*3/uL (ref 150.0–400.0)
RBC: 3.73 Mil/uL — ABNORMAL LOW (ref 3.87–5.11)
RDW: 13.3 % (ref 11.5–15.5)
WBC: 7.3 10*3/uL (ref 4.0–10.5)

## 2017-11-04 LAB — HEMOGLOBIN A1C: Hgb A1c MFr Bld: 8 % — ABNORMAL HIGH (ref 4.6–6.5)

## 2017-11-04 NOTE — Patient Instructions (Signed)
BEFORE YOU LEAVE: -labs -follow up:  1)TOMORROW with susan as scheduled for AWV 2) 3 months - Procedure visit (30 minutes) for removal lesion and follow up  We have ordered labs or studies at this visit. It can take up to 1-2 weeks for results and processing. IF results require follow up or explanation, we will call you with instructions. Clinically stable results will be released to your [facility]. If you have not heard from [place] or cannot find your results in [facility] in 2 weeks please contact our office at [phone].  If you are not yet signed up for [facility], please consider signing up.   Health Maintenance for Postmenopausal Women Menopause is a normal process in which your reproductive ability comes to an end. This process happens gradually over a span of months to years, usually between the ages of 71 and 19. Menopause is complete when you have missed 12 consecutive menstrual periods. It is important to talk with your health care provider about some of the most common conditions that affect postmenopausal women, such as heart disease, cancer, and bone loss (osteoporosis). Adopting a healthy lifestyle and getting preventive care can help to promote your health and wellness. Those actions can also lower your chances of developing some of these common conditions. What should I know about menopause? During menopause, you may experience a number of symptoms, such as:  Moderate-to-severe hot flashes.  Night sweats.  Decrease in sex drive.  Mood swings.  Headaches.  Tiredness.  Irritability.  Memory problems.  Insomnia.  Choosing to treat or not to treat menopausal changes is an individual decision that you make with your health care provider. What should I know about hormone replacement therapy and supplements? Hormone therapy products are effective for treating symptoms that are associated with menopause, such as hot flashes and night sweats. Hormone replacement carries certain  risks, especially as you become older. If you are thinking about using estrogen or estrogen with progestin treatments, discuss the benefits and risks with your health care provider. What should I know about heart disease and stroke? Heart disease, heart attack, and stroke become more likely as you age. This may be due, in part, to the hormonal changes that your body experiences during menopause. These can affect how your body processes dietary fats, triglycerides, and cholesterol. Heart attack and stroke are both medical emergencies. There are many things that you can do to help prevent heart disease and stroke:  Have your blood pressure checked at least every 1-2 years. High blood pressure causes heart disease and increases the risk of stroke.  If you are 31-30 years old, ask your health care provider if you should take aspirin to prevent a heart attack or a stroke.  Do not use any tobacco products, including cigarettes, chewing tobacco, or electronic cigarettes. If you need help quitting, ask your health care provider.  It is important to eat a healthy diet and maintain a healthy weight. ? Be sure to include plenty of vegetables, fruits, low-fat dairy products, and lean protein. ? Avoid eating foods that are high in solid fats, added sugars, or salt (sodium).  Get regular exercise. This is one of the most important things that you can do for your health. ? Try to exercise for at least 150 minutes each week. The type of exercise that you do should increase your heart rate and make you sweat. This is known as moderate-intensity exercise. ? Try to do strengthening exercises at least twice each week. Do these  in addition to the moderate-intensity exercise.  Know your numbers.Ask your health care provider to check your cholesterol and your blood glucose. Continue to have your blood tested as directed by your health care provider.  What should I know about cancer screening? There are several types  of cancer. Take the following steps to reduce your risk and to catch any cancer development as early as possible. Breast Cancer  Practice breast self-awareness. ? This means understanding how your breasts normally appear and feel. ? It also means doing regular breast self-exams. Let your health care provider know about any changes, no matter how small.  If you are 57 or older, have a clinician do a breast exam (clinical breast exam or CBE) every year. Depending on your age, family history, and medical history, it may be recommended that you also have a yearly breast X-ray (mammogram).  If you have a family history of breast cancer, talk with your health care provider about genetic screening.  If you are at high risk for breast cancer, talk with your health care provider about having an MRI and a mammogram every year.  Breast cancer (BRCA) gene test is recommended for women who have family members with BRCA-related cancers. Results of the assessment will determine the need for genetic counseling and BRCA1 and for BRCA2 testing. BRCA-related cancers include these types: ? Breast. This occurs in males or females. ? Ovarian. ? Tubal. This may also be called fallopian tube cancer. ? Cancer of the abdominal or pelvic lining (peritoneal cancer). ? Prostate. ? Pancreatic.  Cervical, Uterine, and Ovarian Cancer Your health care provider may recommend that you be screened regularly for cancer of the pelvic organs. These include your ovaries, uterus, and vagina. This screening involves a pelvic exam, which includes checking for microscopic changes to the surface of your cervix (Pap test).  For women ages 21-65, health care providers may recommend a pelvic exam and a Pap test every three years. For women ages 9-65, they may recommend the Pap test and pelvic exam, combined with testing for human papilloma virus (HPV), every five years. Some types of HPV increase your risk of cervical cancer. Testing for  HPV may also be done on women of any age who have unclear Pap test results.  Other health care providers may not recommend any screening for nonpregnant women who are considered low risk for pelvic cancer and have no symptoms. Ask your health care provider if a screening pelvic exam is right for you.  If you have had past treatment for cervical cancer or a condition that could lead to cancer, you need Pap tests and screening for cancer for at least 20 years after your treatment. If Pap tests have been discontinued for you, your risk factors (such as having a new sexual partner) need to be reassessed to determine if you should start having screenings again. Some women have medical problems that increase the chance of getting cervical cancer. In these cases, your health care provider may recommend that you have screening and Pap tests more often.  If you have a family history of uterine cancer or ovarian cancer, talk with your health care provider about genetic screening.  If you have vaginal bleeding after reaching menopause, tell your health care provider.  There are currently no reliable tests available to screen for ovarian cancer.  Lung Cancer Lung cancer screening is recommended for adults 96-48 years old who are at high risk for lung cancer because of a history of smoking.  A yearly low-dose CT scan of the lungs is recommended if you:  Currently smoke.  Have a history of at least 30 pack-years of smoking and you currently smoke or have quit within the past 15 years. A pack-year is smoking an average of one pack of cigarettes per day for one year.  Yearly screening should:  Continue until it has been 15 years since you quit.  Stop if you develop a health problem that would prevent you from having lung cancer treatment.  Colorectal Cancer  This type of cancer can be detected and can often be prevented.  Routine colorectal cancer screening usually begins at age 69 and continues through  age 72.  If you have risk factors for colon cancer, your health care provider may recommend that you be screened at an earlier age.  If you have a family history of colorectal cancer, talk with your health care provider about genetic screening.  Your health care provider may also recommend using home test kits to check for hidden blood in your stool.  A small camera at the end of a tube can be used to examine your colon directly (sigmoidoscopy or colonoscopy). This is done to check for the earliest forms of colorectal cancer.  Direct examination of the colon should be repeated every 5-10 years until age 55. However, if early forms of precancerous polyps or small growths are found or if you have a family history or genetic risk for colorectal cancer, you may need to be screened more often.  Skin Cancer  Check your skin from head to toe regularly.  Monitor any moles. Be sure to tell your health care provider: ? About any new moles or changes in moles, especially if there is a change in a mole's shape or color. ? If you have a mole that is larger than the size of a pencil eraser.  If any of your family members has a history of skin cancer, especially at a young age, talk with your health care provider about genetic screening.  Always use sunscreen. Apply sunscreen liberally and repeatedly throughout the day.  Whenever you are outside, protect yourself by wearing long sleeves, pants, a wide-brimmed hat, and sunglasses.  What should I know about osteoporosis? Osteoporosis is a condition in which bone destruction happens more quickly than new bone creation. After menopause, you may be at an increased risk for osteoporosis. To help prevent osteoporosis or the bone fractures that can happen because of osteoporosis, the following is recommended:  If you are 58-46 years old, get at least 1,000 mg of calcium and at least 600 mg of vitamin D per day.  If you are older than age 43 but younger than  age 37, get at least 1,200 mg of calcium and at least 600 mg of vitamin D per day.  If you are older than age 35, get at least 1,200 mg of calcium and at least 800 mg of vitamin D per day.  Smoking and excessive alcohol intake increase the risk of osteoporosis. Eat foods that are rich in calcium and vitamin D, and do weight-bearing exercises several times each week as directed by your health care provider. What should I know about how menopause affects my mental health? Depression may occur at any age, but it is more common as you become older. Common symptoms of depression include:  Low or sad mood.  Changes in sleep patterns.  Changes in appetite or eating patterns.  Feeling an overall lack of motivation  or enjoyment of activities that you previously enjoyed.  Frequent crying spells.  Talk with your health care provider if you think that you are experiencing depression. What should I know about immunizations? It is important that you get and maintain your immunizations. These include:  Tetanus, diphtheria, and pertussis (Tdap) booster vaccine.  Influenza every year before the flu season begins.  Pneumonia vaccine.  Shingles vaccine.  Your health care provider may also recommend other immunizations. This information is not intended to replace advice given to you by your health care provider. Make sure you discuss any questions you have with your health care provider. Document Released: 01/01/2006 Document Revised: 05/29/2016 Document Reviewed: 08/13/2015 Elsevier Interactive Patient Education  2018 [employer].

## 2017-11-05 ENCOUNTER — Ambulatory Visit (INDEPENDENT_AMBULATORY_CARE_PROVIDER_SITE_OTHER): Payer: Medicare Other

## 2017-11-05 ENCOUNTER — Encounter: Payer: Self-pay | Admitting: *Deleted

## 2017-11-05 VITALS — BP 118/58 | HR 63 | Ht 64.0 in | Wt 195.8 lb

## 2017-11-05 DIAGNOSIS — Z0000 Encounter for general adult medical examination without abnormal findings: Secondary | ICD-10-CM

## 2017-11-05 NOTE — Patient Instructions (Addendum)
Ms. Tozer , Thank you for taking time to come for your Medicare Wellness Visit. I appreciate your ongoing commitment to your health goals. Please review the following plan we discussed and let me know if I can assist you in the future.   Shingrix is a vaccine for the prevention of Shingles in Adults 50 and older.  If you are on Medicare, you can request a prescription from your doctor to be filled at a pharmacy.  Please check with your benefits regarding applicable copays or out of pocket expenses.  The Shingrix is given in 2 vaccines approx 8 weeks apart. You must receive the 2nd dose prior to 6 months from receipt of the first.    Diabetes and weight loss; [facility]  Phone: [phone]   [facility]; 214-700-8957 Sr. Line; 414-246-6266 Get resource to get information on any and all community programs for Seniors  Community solutions; "Aging Gracefully In Place" program; can request or apply  [employer]: 410-356-5138 Community Health Response Program -630-160-1093 Public Health Dept; Need to be a skilled visit but can assist with bathing as well; 254-667-1839  Adult center for Enrichment;  Call Senior Line; [phone]  Adult day services include Adult Day Care, Adult Day Healthcare, Group Respite, Care Partners, Volunteer In [place], Education and Support Program  Help with Rx at [place]  Monday - Friday 8am to 10pm EST Sat- Sunday 9am to 7pm Patient help line (909)276-9187 Email support online at [email or web address]   Deaf & Hard of Hearing Division Services - can assist with hearing aid x 1  No reviews  [place]  [phone]  [phone]  These are the goals we discussed: Goals    . Exercise 150 min/wk Moderate Activity     Continues exercise at the y x 3 per week        This is a list of the screening recommended for you and due dates:  Health Maintenance   Topic Date Due  . Tetanus Vaccine  02/27/2025*  . Eye exam for diabetics  02/02/2018  . Hemoglobin A1C  05/05/2018  . Complete foot exam   11/04/2018  . Flu Shot  Completed  . DEXA scan (bone density measurement)  Completed  . Pneumonia vaccines  Completed  *Topic was postponed. The date shown is not the original due date.      Fall Prevention in the Home Falls can cause injuries. They can happen to people of all ages. There are many things you can do to make your home safe and to help prevent falls. What can I do on the outside of my home?  Regularly fix the edges of walkways and driveways and fix any cracks.  Remove anything that might make you trip as you walk through a door, such as a raised step or threshold.  Trim any bushes or trees on the path to your home.  Use bright outdoor lighting.  Clear any walking paths of anything that might make someone trip, such as rocks or tools.  Regularly check to see if handrails are loose or broken. Make sure that both sides of any steps have handrails.  Any raised decks and porches should have guardrails on the edges.  Have any leaves, snow, or ice cleared regularly.  Use sand or salt on walking paths during winter.  Clean up any spills in your garage right away. This includes oil or grease spills. What can I do  in the bathroom?  Use night lights.  Install grab bars by the toilet and in the tub and shower. Do not use towel bars as grab bars.  Use non-skid mats or decals in the tub or shower.  If you need to sit down in the shower, use a plastic, non-slip stool.  Keep the floor dry. Clean up any water that spills on the floor as soon as it happens.  Remove soap buildup in the tub or shower regularly.  Attach bath mats securely with double-sided non-slip rug tape.  Do not have throw rugs and other things on the floor that can make you trip. What can I do in the bedroom?  Use night lights.  Make sure that you have a  light by your bed that is easy to reach.  Do not use any sheets or blankets that are too big for your bed. They should not hang down onto the floor.  Have a firm chair that has side arms. You can use this for support while you get dressed.  Do not have throw rugs and other things on the floor that can make you trip. What can I do in the kitchen?  Clean up any spills right away.  Avoid walking on wet floors.  Keep items that you use a lot in easy-to-reach places.  If you need to reach something above you, use a strong step stool that has a grab bar.  Keep electrical cords out of the way.  Do not use floor polish or wax that makes floors slippery. If you must use wax, use non-skid floor wax.  Do not have throw rugs and other things on the floor that can make you trip. What can I do with my stairs?  Do not leave any items on the stairs.  Make sure that there are handrails on both sides of the stairs and use them. Fix handrails that are broken or loose. Make sure that handrails are as long as the stairways.  Check any carpeting to make sure that it is firmly attached to the stairs. Fix any carpet that is loose or worn.  Avoid having throw rugs at the top or bottom of the stairs. If you do have throw rugs, attach them to the floor with carpet tape.  Make sure that you have a light switch at the top of the stairs and the bottom of the stairs. If you do not have them, ask someone to add them for you. What else can I do to help prevent falls?  Wear shoes that: ? Do not have high heels. ? Have rubber bottoms. ? Are comfortable and fit you well. ? Are closed at the toe. Do not wear sandals.  If you use a stepladder: ? Make sure that it is fully opened. Do not climb a closed stepladder. ? Make sure that both sides of the stepladder are locked into place. ? Ask someone to hold it for you, if possible.  Clearly mark and make sure that you can see: ? Any grab bars or  handrails. ? First and last steps. ? Where the edge of each step is.  Use tools that help you move around (mobility aids) if they are needed. These include: ? Canes. ? Walkers. ? Scooters. ? Crutches.  Turn on the lights when you go into a dark area. Replace any light bulbs as soon as they burn out.  Set up your furniture so you have a clear path. Avoid moving your furniture  around.  If any of your floors are uneven, fix them.  If there are any pets around you, be aware of where they are.  Review your medicines with your doctor. Some medicines can make you feel dizzy. This can increase your chance of falling. Ask your doctor what other things that you can do to help prevent falls. This information is not intended to replace advice given to you by your health care provider. Make sure you discuss any questions you have with your health care provider. Document Released: 09/05/2009 Document Revised: 04/16/2016 Document Reviewed: 12/14/2014 Elsevier Interactive Patient Education  2018 [place] Maintenance, Female Adopting a healthy lifestyle and getting preventive care can go a long way to promote health and wellness. Talk with your health care provider about what schedule of regular examinations is right for you. This is a good chance for you to check in with your provider about disease prevention and staying healthy. In between checkups, there are plenty of things you can do on your own. Experts have done a lot of research about which lifestyle changes and preventive measures are most likely to keep you healthy. Ask your health care provider for more information. Weight and diet Eat a healthy diet  Be sure to include plenty of vegetables, fruits, low-fat dairy products, and lean protein.  Do not eat a lot of foods high in solid fats, added sugars, or salt.  Get regular exercise. This is one of the most important things you can do for your health. ? Most adults should  exercise for at least 150 minutes each week. The exercise should increase your heart rate and make you sweat (moderate-intensity exercise). ? Most adults should also do strengthening exercises at least twice a week. This is in addition to the moderate-intensity exercise.  Maintain a healthy weight  Body mass index (BMI) is a measurement that can be used to identify possible weight problems. It estimates body fat based on height and weight. Your health care provider can help determine your BMI and help you achieve or maintain a healthy weight.  For females 83 years of age and older: ? A BMI below 18.5 is considered underweight. ? A BMI of 18.5 to 24.9 is normal. ? A BMI of 25 to 29.9 is considered overweight. ? A BMI of 30 and above is considered obese.  Watch levels of cholesterol and blood lipids  You should start having your blood tested for lipids and cholesterol at 81 years of age, then have this test every 5 years.  You may need to have your cholesterol levels checked more often if: ? Your lipid or cholesterol levels are high. ? You are older than 81 years of age. ? You are at high risk for heart disease.  Cancer screening Lung Cancer  Lung cancer screening is recommended for adults 2-61 years old who are at high risk for lung cancer because of a history of smoking.  A yearly low-dose CT scan of the lungs is recommended for people who: ? Currently smoke. ? Have quit within the past 15 years. ? Have at least a 30-pack-year history of smoking. A pack year is smoking an average of one pack of cigarettes a day for 1 year.  Yearly screening should continue until it has been 15 years since you quit.  Yearly screening should stop if you develop a health problem that would prevent you from having lung cancer treatment.  Breast Cancer  Practice breast self-awareness. This means understanding  how your breasts normally appear and feel.  It also means doing regular breast  self-exams. Let your health care provider know about any changes, no matter how small.  If you are in your 20s or 30s, you should have a clinical breast exam (CBE) by a health care provider every 1-3 years as part of a regular health exam.  If you are 42 or older, have a CBE every year. Also consider having a breast X-ray (mammogram) every year.  If you have a family history of breast cancer, talk to your health care provider about genetic screening.  If you are at high risk for breast cancer, talk to your health care provider about having an MRI and a mammogram every year.  Breast cancer gene (BRCA) assessment is recommended for women who have family members with BRCA-related cancers. BRCA-related cancers include: ? Breast. ? Ovarian. ? Tubal. ? Peritoneal cancers.  Results of the assessment will determine the need for genetic counseling and BRCA1 and BRCA2 testing.  Cervical Cancer Your health care provider may recommend that you be screened regularly for cancer of the pelvic organs (ovaries, uterus, and vagina). This screening involves a pelvic examination, including checking for microscopic changes to the surface of your cervix (Pap test). You may be encouraged to have this screening done every 3 years, beginning at age 55.  For women ages 48-65, health care providers may recommend pelvic exams and Pap testing every 3 years, or they may recommend the Pap and pelvic exam, combined with testing for human papilloma virus (HPV), every 5 years. Some types of HPV increase your risk of cervical cancer. Testing for HPV may also be done on women of any age with unclear Pap test results.  Other health care providers may not recommend any screening for nonpregnant women who are considered low risk for pelvic cancer and who do not have symptoms. Ask your health care provider if a screening pelvic exam is right for you.  If you have had past treatment for cervical cancer or a condition that could  lead to cancer, you need Pap tests and screening for cancer for at least 20 years after your treatment. If Pap tests have been discontinued, your risk factors (such as having a new sexual partner) need to be reassessed to determine if screening should resume. Some women have medical problems that increase the chance of getting cervical cancer. In these cases, your health care provider may recommend more frequent screening and Pap tests.  Colorectal Cancer  This type of cancer can be detected and often prevented.  Routine colorectal cancer screening usually begins at 81 years of age and continues through 81 years of age.  Your health care provider may recommend screening at an earlier age if you have risk factors for colon cancer.  Your health care provider may also recommend using home test kits to check for hidden blood in the stool.  A small camera at the end of a tube can be used to examine your colon directly (sigmoidoscopy or colonoscopy). This is done to check for the earliest forms of colorectal cancer.  Routine screening usually begins at age 55.  Direct examination of the colon should be repeated every 5-10 years through 81 years of age. However, you may need to be screened more often if early forms of precancerous polyps or small growths are found.  Skin Cancer  Check your skin from head to toe regularly.  Tell your health care provider about any new moles or  changes in moles, especially if there is a change in a mole's shape or color.  Also tell your health care provider if you have a mole that is larger than the size of a pencil eraser.  Always use sunscreen. Apply sunscreen liberally and repeatedly throughout the day.  Protect yourself by wearing long sleeves, pants, a wide-brimmed hat, and sunglasses whenever you are outside.  Heart disease, diabetes, and high blood pressure  High blood pressure causes heart disease and increases the risk of stroke. High blood pressure  is more likely to develop in: ? People who have blood pressure in the high end of the normal range (130-139/85-89 mm Hg). ? People who are overweight or obese. ? People who are African American.  If you are 51-54 years of age, have your blood pressure checked every 3-5 years. If you are 53 years of age or older, have your blood pressure checked every year. You should have your blood pressure measured twice-once when you are at a hospital or clinic, and once when you are not at a hospital or clinic. Record the average of the two measurements. To check your blood pressure when you are not at a hospital or clinic, you can use: ? An automated blood pressure machine at a pharmacy. ? A home blood pressure monitor.  If you are between 47 years and 9 years old, ask your health care provider if you should take aspirin to prevent strokes.  Have regular diabetes screenings. This involves taking a blood sample to check your fasting blood sugar level. ? If you are at a normal weight and have a low risk for diabetes, have this test once every three years after 81 years of age. ? If you are overweight and have a high risk for diabetes, consider being tested at a younger age or more often. Preventing infection Hepatitis B  If you have a higher risk for hepatitis B, you should be screened for this virus. You are considered at high risk for hepatitis B if: ? You were born in a country where hepatitis B is common. Ask your health care provider which countries are considered high risk. ? Your parents were born in a high-risk country, and you have not been immunized against hepatitis B (hepatitis B vaccine). ? You have HIV or AIDS. ? You use needles to inject street drugs. ? You live with someone who has hepatitis B. ? You have had sex with someone who has hepatitis B. ? You get hemodialysis treatment. ? You take certain medicines for conditions, including cancer, organ transplantation, and autoimmune  conditions.  Hepatitis C  Blood testing is recommended for: ? Everyone born from [birth year] through 1963. ? Anyone with known risk factors for hepatitis C.  Sexually transmitted infections (STIs)  You should be screened for sexually transmitted infections (STIs) including gonorrhea and chlamydia if: ? You are sexually active and are younger than 81 years of age. ? You are older than 81 years of age and your health care provider tells you that you are at risk for this type of infection. ? Your sexual activity has changed since you were last screened and you are at an increased risk for chlamydia or gonorrhea. Ask your health care provider if you are at risk.  If you do not have HIV, but are at risk, it may be recommended that you take a prescription medicine daily to prevent HIV infection. This is called pre-exposure prophylaxis (PrEP). You are considered at risk if: ?  You are sexually active and do not regularly use condoms or know the HIV status of your partner(s). ? You take drugs by injection. ? You are sexually active with a partner who has HIV.  Talk with your health care provider about whether you are at high risk of being infected with HIV. If you choose to begin PrEP, you should first be tested for HIV. You should then be tested every 3 months for as long as you are taking PrEP. Pregnancy  If you are premenopausal and you may become pregnant, ask your health care provider about preconception counseling.  If you may become pregnant, take 400 to 800 micrograms (mcg) of folic acid every day.  If you want to prevent pregnancy, talk to your health care provider about birth control (contraception). Osteoporosis and menopause  Osteoporosis is a disease in which the bones lose minerals and strength with aging. This can result in serious bone fractures. Your risk for osteoporosis can be identified using a bone density scan.  If you are 6 years of age or older, or if you are at risk for  osteoporosis and fractures, ask your health care provider if you should be screened.  Ask your health care provider whether you should take a calcium or vitamin D supplement to lower your risk for osteoporosis.  Menopause may have certain physical symptoms and risks.  Hormone replacement therapy may reduce some of these symptoms and risks. Talk to your health care provider about whether hormone replacement therapy is right for you. Follow these instructions at home:  Schedule regular health, dental, and eye exams.  Stay current with your immunizations.  Do not use any tobacco products including cigarettes, chewing tobacco, or electronic cigarettes.  If you are pregnant, do not drink alcohol.  If you are breastfeeding, limit how much and how often you drink alcohol.  Limit alcohol intake to no more than 1 drink per day for nonpregnant women. One drink equals 12 ounces of beer, 5 ounces of wine, or 1 ounces of hard liquor.  Do not use street drugs.  Do not share needles.  Ask your health care provider for help if you need support or information about quitting drugs.  Tell your health care provider if you often feel depressed.  Tell your health care provider if you have ever been abused or do not feel safe at home. This information is not intended to replace advice given to you by your health care provider. Make sure you discuss any questions you have with your health care provider. Document Released: 05/25/2011 Document Revised: 04/16/2016 Document Reviewed: 08/13/2015 Elsevier Interactive Patient Education  [employer].

## 2017-11-05 NOTE — Progress Notes (Signed)
Thelma Lorenzetti R., DO  

## 2017-12-26 IMAGING — US US THYROID
1 series · 13 of 25 positions shown · non-contrast
Comparison: 01/24/2013 and previous

CLINICAL DATA: Goiter, nodules. Previous FNA biopsy of inferior
left nodule 01/27/2012.

EXAM:
THYROID ULTRASOUND
TECHNIQUE: Ultrasound examination of the thyroid gland and adjacent soft
tissues was performed.

[Series 1: us thyroid · 0.08mm/px · 13 of 49 slices shown]
[im 1/49]
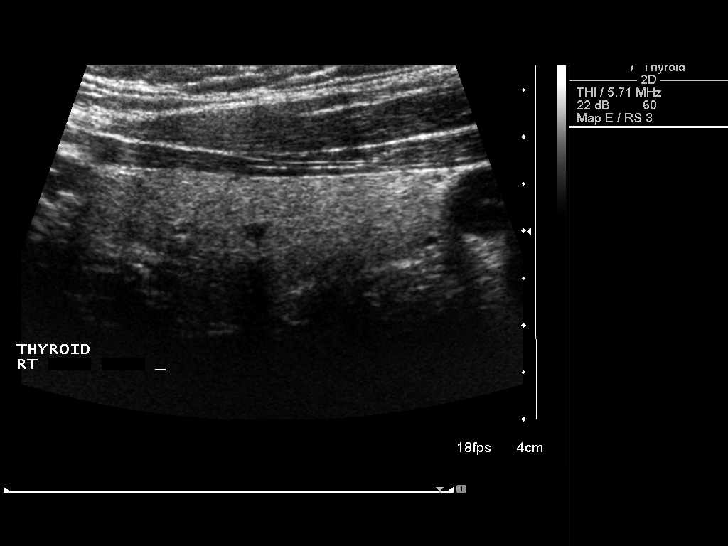
[im 5/49]
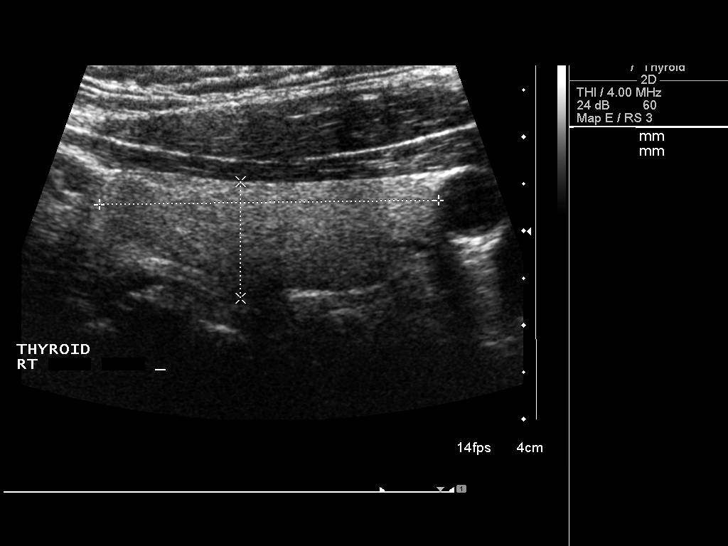
[im 9/49]
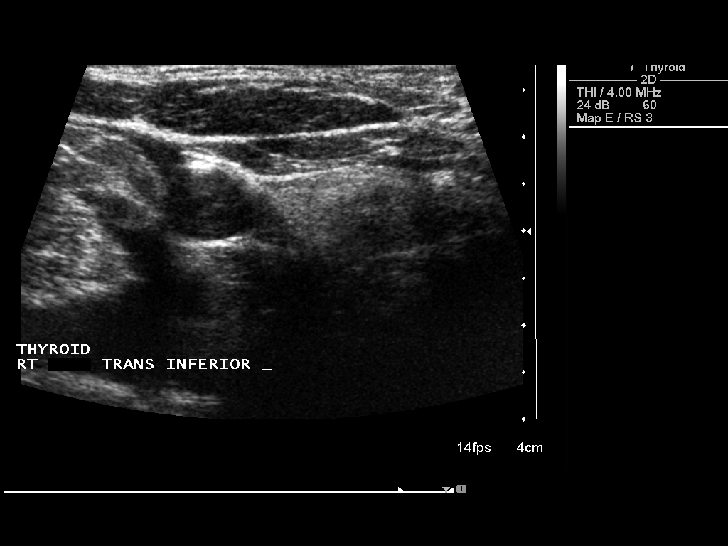
[im 13/49]
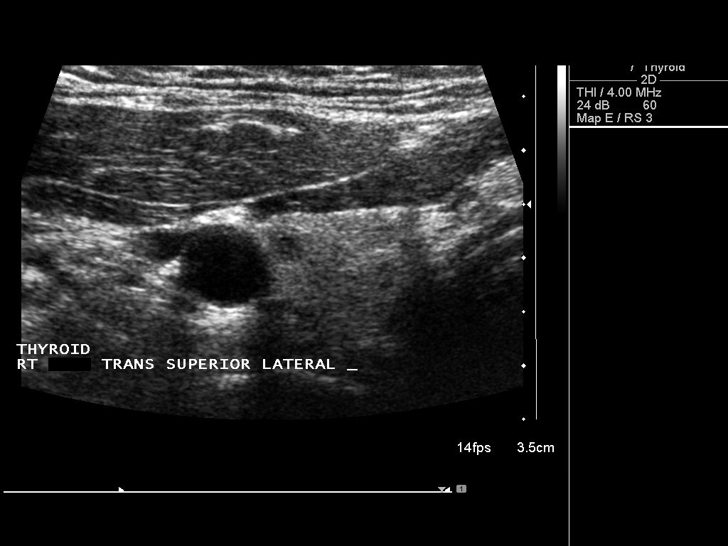
[im 17/49]
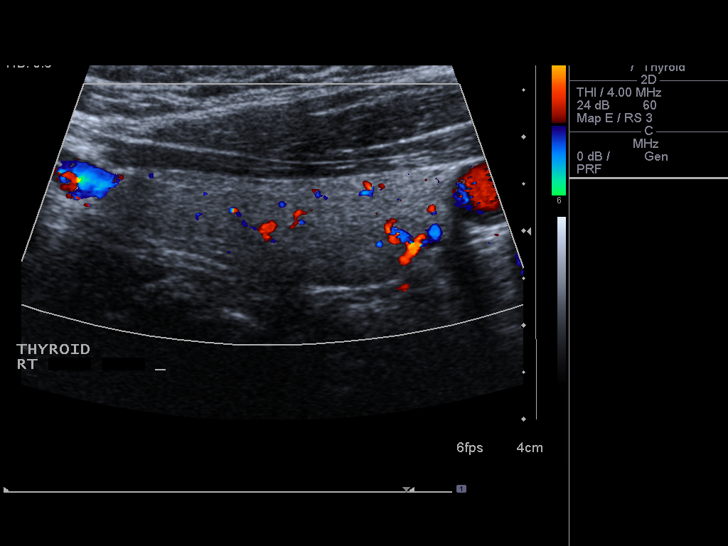
[im 21/49]
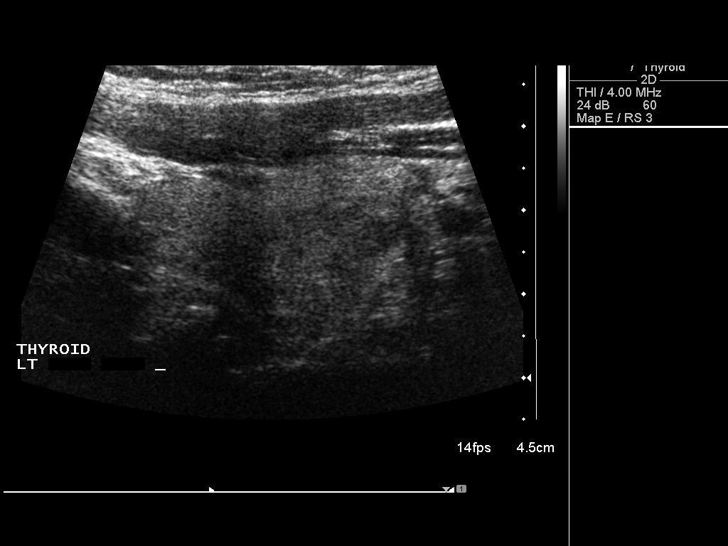
[im 25/49]
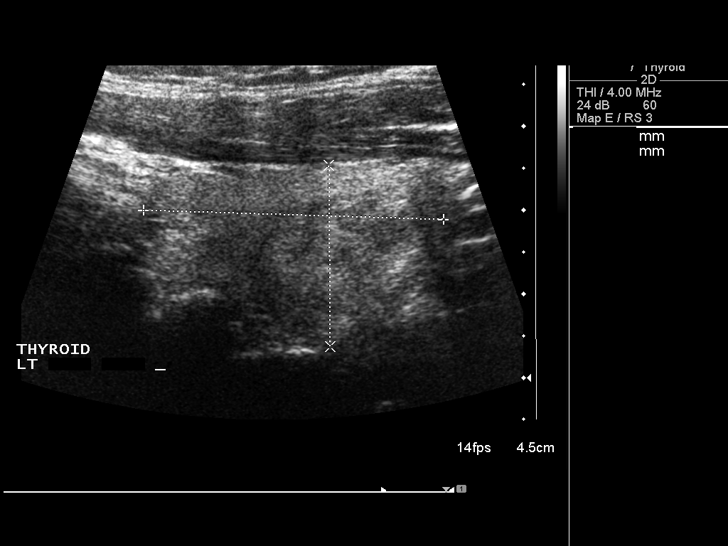
[im 29/49]
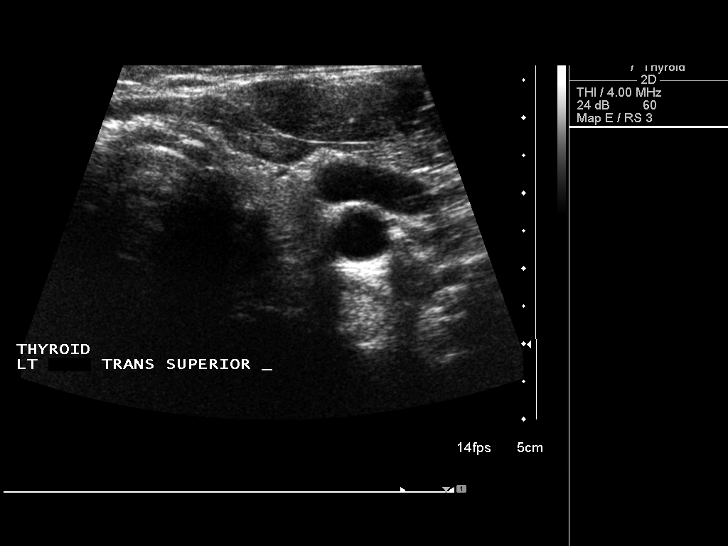
[im 33/49]
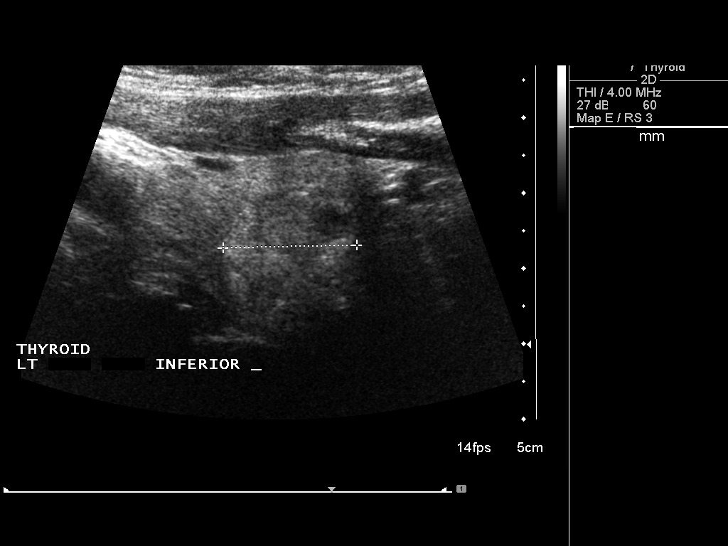
[im 37/49]
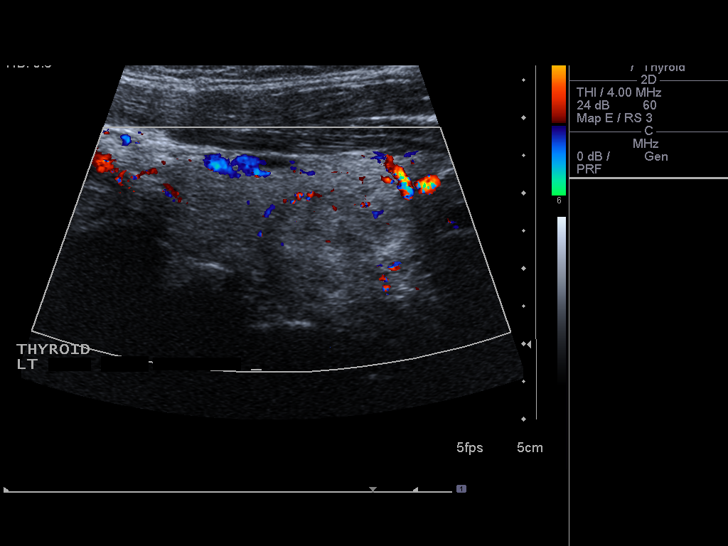
[im 41/49]
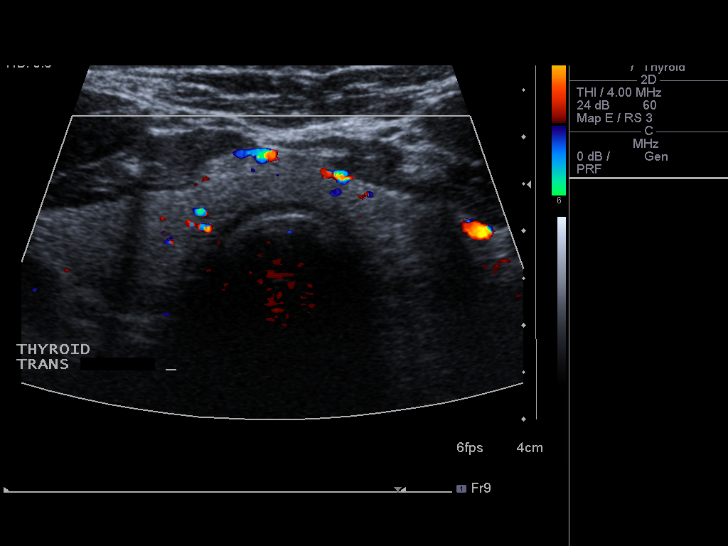
[im 45/49]
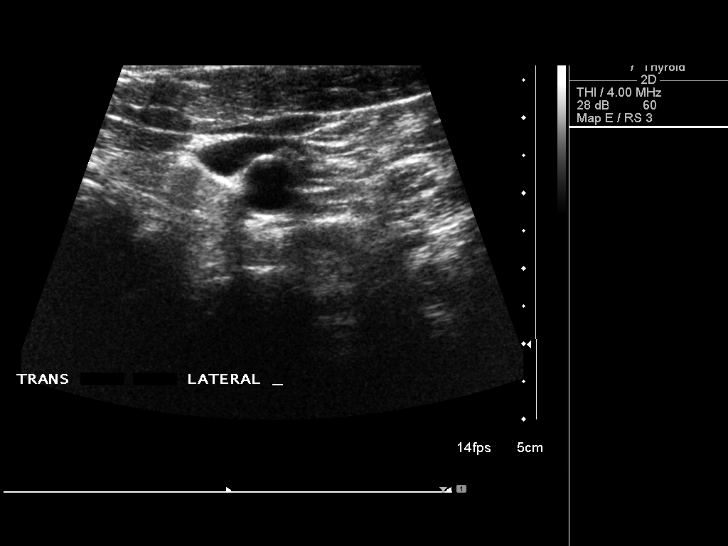
[im 49/49]
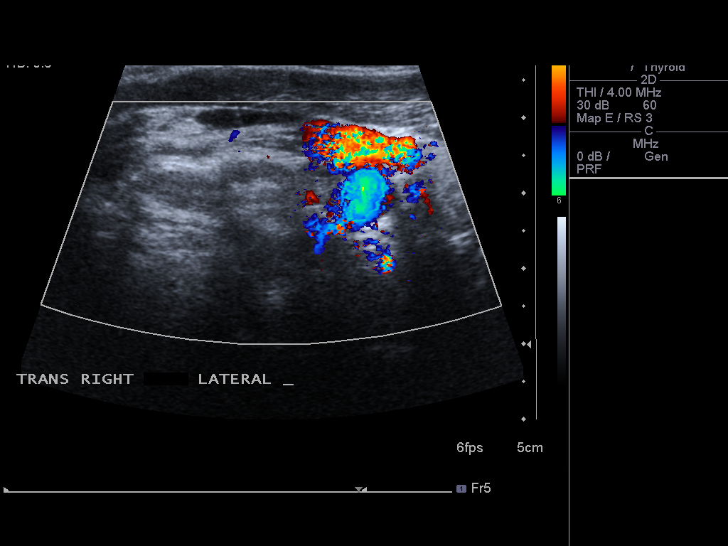

[13 of 25 positions shown; findings below may reference images not displayed]

FINDINGS: Parenchymal Echotexture: Mildly heterogenous

Estimated total number of nodules >/= 1 cm: 1

Number of spongiform nodules > 2 cm not described below (TR1): 0

Number of mixed cystic and solid nodules > 1.5 cm not described
below (TR2): 0

_________________________________________________________

Isthmus: 7 mm in thickness

No discrete nodules are identified within the thyroid isthmus.

_________________________________________________________

Right lobe: 3.6 x 1.4 x 1.8 cm

At least 2 hypoechoic/cystic nodules measuring 4 mm or less.

_________________________________________________________

Left lobe: 3.6 x 2.2 x 1.7 cm

Nodule # 1:

Location: Left; Inferior

Size: 1.8 x 1.6 x 1.7 cm (previously 1.4 x 1.5 x 1.9)

Composition: solid/almost completely solid (2)

Echogenicity: hyperechoic (1)

Shape: not taller-than-wide (0)

Margins: ill-defined (0)

Echogenic foci: none (0)

ACR TI-RADS total points: 3.

ACR TI-RADS risk category: TR3 (3 points).

ACR TI-RADS recommendations:

Previously biopsied. Assuming a benign diagnosis, repeat biopsy or
dedicated follow-up is NOT recommended based on TI-RADS criteria.
IMPRESSION: 1. Stable left nodule. Assuming a benign diagnosis, repeat biopsy or
dedicated imaging follow-up is NOT recommended .

The above is in keeping with the ACR TI-RADS recommendations - [HOSPITAL] 8033;[DATE].

## 2018-01-11 ENCOUNTER — Encounter: Payer: Self-pay | Admitting: Family Medicine

## 2018-01-11 ENCOUNTER — Other Ambulatory Visit: Payer: Self-pay | Admitting: Cardiovascular Disease

## 2018-01-11 LAB — BASIC METABOLIC PANEL
BUN: 43 — AB (ref 4–21)
Creatinine: 1.7 — AB (ref <=1.1)
Glucose: 144
Potassium: 4.8 (ref 3.4–5.3)
Sodium: 142 (ref 137–147)

## 2018-01-11 NOTE — Telephone Encounter (Signed)
Rx(s) sent to pharmacy electronically.  

## 2018-02-01 DIAGNOSIS — H6123 Impacted cerumen, bilateral: Secondary | ICD-10-CM | POA: Insufficient documentation

## 2018-02-01 DIAGNOSIS — H6122 Impacted cerumen, left ear: Secondary | ICD-10-CM | POA: Insufficient documentation

## 2018-02-06 NOTE — Progress Notes (Signed)
HPI:  Using dictation device. Unfortunately this device frequently misinterprets words/phrases.  AWV 10/2017  Bonnie Chavez is a pleasant 84 y.o. here for follow up. Chronic medical problems summarized below were reviewed for changes. She reports doing well. Reports the skin lesion on her arm fell of and disappeared so no longer needs removal [facility] Did labs with Dr. Chalmers Cater - hgba1c improving. Denies CP, SOB, DOE, treatment intolerance or new symptoms. AWV 11/05/17 - labs showed elevated hgba1c 8.0 - she sees endo for medication managment Eye exam due, Due for labs  CdCHF/HLD/aortic valve sclerosis/Obesity: -sees cardiologist (Dr. Claiborne Billings) -meds:amlodipine 10, crestor 40, lasix 20, lisinopril 10, nebivolol 5, asa -[facility] in [facility]ics  DM, Hyperparathyroidism: -sees endocriologist (Dr. Chalmers Cater) -meds: insulin- humulin n 14 units bid -[facility] exam with [facility] care 02/2016  CKD/vit D def: -sees nephrology ([name]) -cr ~1.7 06/2017  HRT for Hot Flashes, Uterine Polyps: -sees gynecologist ([name]) -s/p D and C recently with her gynecologist  DDD/Lumbar radiculopathy/CTS: -seeing [place] [facility]  ROS: See pertinent positives and negatives per HPI.  Past Medical History:  Diagnosis Date  . Anemia    History of , resolved  . Aortic valve sclerosis   . Carpal tunnel syndrome   . Chicken pox   . Chronic low back pain 02/27/2016   -DDD -seeing [place] [facility]   . Diabetes mellitus    type 2, sees Dr. Chalmers Cater  . Goiter    Dr. Chalmers Cater  . Heart murmur   . History of nuclear stress test 06/2010   dipyridamole; normal pattern of perfusion; ekg negative for ischemia; low risk scan   . Hot flashes   . Hyperlipidemia   . Hyperparathyroidism ([place]) 02/27/2016   -sees endocrinologist, Dr. Chalmers Cater, has seen surgeon as well   . Hypertension   . Migraine    no longer has migraines (80's)  . Mitral valve prolapse    . Neuropathy   . Obesity 02/27/2016  . Osteoarthritis    DDD, sees [place] orth[facility]  . PMB (postmenopausal bleeding) 02/28/2009   Resolved  . Pneumonia    history of   . Renal insufficiency    [name]   . Vaginal atrophy   . Vitamin D deficiency     Past Surgical History:  Procedure Laterality Date  . [place] &1976  . CHOLECYSTECTOMY  1991  . COLONOSCOPY    . DILATATION & CURRETTAGE/HYSTEROSCOPY WITH RESECTOCOPE N/A 05/15/2013   Procedure: [place];  Surgeon: Eldred Manges, MD;  Location: [facility];  Service: Gynecology;  Laterality: N/A;  . DILATATION & CURRETTAGE/HYSTEROSCOPY WITH RESECTOCOPE N/A 06/03/2017   Procedure: DILATATION & CURETTAGE/HYSTEROSCOPY;  Surgeon: Eldred Manges, MD;  Location: [facility];  Service: Gynecology;  Laterality: N/A;  . DILATION AND CURETTAGE OF UTERUS  1975  . [place]  . KNEE SURGERY  2000 2011   right knee replacement   . REPLACEMENT TOTAL KNEE  2000   left  . SHOULDER SURGERY  2004 2010   . TRANSTHORACIC ECHOCARDIOGRAM  07/2010   EF=>55%; LA mildly dilated; trace MR/TR;  . TUBAL LIGATION  1976    Family History  Problem Relation Age of Onset  . Hypertension Mother   . Diabetes Mother   . Hyperlipidemia Mother   . Heart attack Mother   . Heart disease Father   . Heart attack Father   .  Stroke Brother   . Heart disease Brother   . Diabetes Brother   . Uterine cancer Maternal Grandmother     SOCIAL HX: no reported changes   Current Outpatient Medications:  .  acetaminophen (TYLENOL) 500 MG tablet, Take 1 tablet (500 mg total) by mouth every 4 (four) hours as needed for moderate pain., Disp: 60 tablet, Rfl: 0 .  amLODipine (NORVASC) 10 MG tablet, Take 1 tablet (10 mg total) by mouth at bedtime., Disp: 180 tablet, Rfl: 0 .  aspirin (ECOTRIN LOW STRENGTH) 81 MG EC tablet, Take 81 mg by mouth at bedtime. Swallow whole. , Disp: , Rfl:  .  cephALEXin (KEFLEX) 500  MG capsule, Take 500-1,000 mg by mouth See admin instructions. TAKE ONLY FOR INVASIVE DENTAL PROCEDURES, Disp: , Rfl:  .  furosemide (LASIX) 20 MG tablet, Take 20 mg by mouth daily. Can take additional tablet if needed, Disp: , Rfl:  .  gabapentin (NEURONTIN) 100 MG capsule, Take 100 mg by mouth at bedtime., Disp: , Rfl:  .  HUMULIN N KWIKPEN 100 UNIT/ML Kiwkpen, Inject 14 Units into the skin at bedtime. , Disp: , Rfl: 6 .  lisinopril (PRINIVIL,ZESTRIL) 10 MG tablet, Take 1 tablet (10 mg total) by mouth daily., Disp: 90 tablet, Rfl: 3 .  nebivolol (BYSTOLIC) 10 MG tablet, Take 0.5 tablets (5 mg total) by mouth 2 (two) times daily., Disp: 10 tablet, Rfl: 0 .  pyridOXINE (VITAMIN B-6) 100 MG tablet, Take 100 mg by mouth 2 (two) times daily. , Disp: , Rfl:  .  rosuvastatin (CRESTOR) 40 MG tablet, TAKE 1 TABLET (40 MG TOTAL) BY MOUTH AT BEDTIME., Disp: 90 tablet, Rfl: 0 .  SENSIPAR 30 MG tablet, TAKE 1 TABLET BY MOUTH AFTER A MEAL, Disp: , Rfl: 6  EXAM:  Vitals:   02/07/18 0812  BP: 130/60  Pulse: 64  Temp: 98 F (36.7 C)    Body mass index is 34.21 kg/m.  GENERAL: vitals reviewed and listed above, alert, oriented, appears well hydrated and in no acute distress  HEENT: atraumatic, conjunttiva clear, no obvious abnormalities on inspection of external nose and ears  NECK: no obvious masses on inspection  LUNGS: clear to auscultation bilaterally, no wheezes, rales or rhonchi, good air movement  CV: HRRR, no peripheral edema  MS: moves all extremities without noticeable abnormality  PSYCH: pleasant and cooperative, no obvious depression or anxiety  ASSESSMENT AND PLAN:  Discussed the following assessment and plan:  Type 2 diabetes mellitus with chronic kidney disease, with long-term current use of insulin, unspecified CKD stage (HCC)  Hyperlipidemia associated with type 2 diabetes mellitus (HCC)  Hyperparathyroidism ([place])  Hypertension associated with diabetes ([place])  BMI  34.0-34.9,adult  -doing great -[facility] -reviewed labs with her from Dr. Almetta Lovely office -encouraged low sugar diet, congratulated on continued exercise -abstract labs -Patient advised to return or notify a doctor immediately if symptoms worsen or persist or new concerns arise.  Patient Instructions  BEFORE YOU LEAVE: -follow up: 3-4 months   We recommend the following healthy lifestyle for LIFE: 1) Small portions. But, make sure to get regular (at least 3 per day), healthy meals and small healthy snacks if needed.  2) Eat a healthy clean diet.   TRY TO EAT: -at least 5-7 servings of low sugar, colorful, and nutrient rich vegetables per day (not corn, potatoes or bananas.) -berries are the best choice if you wish to eat fruit (only eat small amounts if trying to reduce  weight)  -lean meets (fish, white meat of chicken or [place]) -vegan proteins for some meals - beans or tofu, whole grains, nuts and seeds -Replace bad fats with good fats - good fats include: fish, nuts and seeds, canola oil, olive oil -small amounts of low fat or non fat dairy -small amounts of100 % whole grains - check the lables -drink plenty of water  AVOID: -SUGAR, sweets, anything with added sugar, corn syrup or sweeteners - must read labels as even foods advertised as "healthy" often are loaded with sugar -if you must have a sweetener, small amounts of stevia may be best -sweetened beverages and artificially sweetened beverages -simple starches (rice, bread, potatoes, pasta, chips, etc - small amounts of 100% whole grains are ok) -red meat, pork, butter -fried foods, fast food, processed food, excessive dairy, eggs and coconut.  3)Get at least 150 minutes of sweaty aerobic exercise per week.  4)Reduce stress - consider counseling, meditation and relaxation to balance other aspects of your life.    Lucretia Kern, DO

## 2018-02-07 ENCOUNTER — Ambulatory Visit: Payer: Medicare Other | Admitting: Family Medicine

## 2018-02-07 ENCOUNTER — Encounter: Payer: Self-pay | Admitting: Family Medicine

## 2018-02-07 VITALS — BP 130/60 | HR 64 | Temp 98.0°F | Ht 64.0 in | Wt 199.3 lb

## 2018-02-07 DIAGNOSIS — I152 Hypertension secondary to endocrine disorders: Secondary | ICD-10-CM

## 2018-02-07 DIAGNOSIS — E1122 Type 2 diabetes mellitus with diabetic chronic kidney disease: Secondary | ICD-10-CM

## 2018-02-07 DIAGNOSIS — Z794 Long term (current) use of insulin: Secondary | ICD-10-CM | POA: Diagnosis not present

## 2018-02-07 DIAGNOSIS — E213 Hyperparathyroidism, unspecified: Secondary | ICD-10-CM | POA: Diagnosis not present

## 2018-02-07 DIAGNOSIS — E785 Hyperlipidemia, unspecified: Secondary | ICD-10-CM | POA: Diagnosis not present

## 2018-02-07 DIAGNOSIS — E1169 Type 2 diabetes mellitus with other specified complication: Secondary | ICD-10-CM

## 2018-02-07 DIAGNOSIS — E1159 Type 2 diabetes mellitus with other circulatory complications: Secondary | ICD-10-CM

## 2018-02-07 DIAGNOSIS — I10 Essential (primary) hypertension: Secondary | ICD-10-CM

## 2018-02-07 DIAGNOSIS — Z6834 Body mass index (BMI) 34.0-34.9, adult: Secondary | ICD-10-CM | POA: Diagnosis not present

## 2018-02-07 NOTE — Patient Instructions (Signed)
BEFORE YOU LEAVE: -follow up: 3-4 months   We recommend the following healthy lifestyle for LIFE: 1) Small portions. But, make sure to get regular (at least 3 per day), healthy meals and small healthy snacks if needed.  2) Eat a healthy clean diet.   TRY TO EAT: -at least 5-7 servings of low sugar, colorful, and nutrient rich vegetables per day (not corn, potatoes or bananas.) -berries are the best choice if you wish to eat fruit (only eat small amounts if trying to reduce weight)  -lean meets (fish, white meat of chicken or turkey) -vegan proteins for some meals - beans or tofu, whole grains, nuts and seeds -Replace bad fats with good fats - good fats include: fish, nuts and seeds, canola oil, olive oil -small amounts of low fat or non fat dairy -small amounts of100 % whole grains - check the lables -drink plenty of water  AVOID: -SUGAR, sweets, anything with added sugar, corn syrup or sweeteners - must read labels as even foods advertised as "healthy" often are loaded with sugar -if you must have a sweetener, small amounts of stevia may be best -sweetened beverages and artificially sweetened beverages -simple starches (rice, bread, potatoes, pasta, chips, etc - small amounts of 100% whole grains are ok) -red meat, pork, butter -fried foods, fast food, processed food, excessive dairy, eggs and coconut.  3)Get at least 150 minutes of sweaty aerobic exercise per week.  4)Reduce stress - consider counseling, meditation and relaxation to balance other aspects of your life.  

## 2018-02-10 LAB — HM DIABETES EYE EXAM

## 2018-04-06 ENCOUNTER — Other Ambulatory Visit: Payer: Self-pay | Admitting: Cardiovascular Disease

## 2018-04-06 NOTE — Telephone Encounter (Signed)
REFILL 

## 2018-04-11 ENCOUNTER — Other Ambulatory Visit: Payer: Self-pay | Admitting: Cardiovascular Disease

## 2018-04-11 NOTE — Telephone Encounter (Signed)
Rx has been sent to the pharmacy electronically. ° °

## 2018-04-12 ENCOUNTER — Other Ambulatory Visit: Payer: Self-pay | Admitting: Cardiovascular Disease

## 2018-05-19 ENCOUNTER — Encounter: Payer: Self-pay | Admitting: Family Medicine

## 2018-05-19 NOTE — Progress Notes (Signed)
Reviewed notes from [place], [name] OV 05/12/18 Changes to meds: lasix 20mg  bid, amlodipine down to 5 mg Labs done. Updated medication list with these changes. Note to Wendie Simmer to ensure pt has corrected refills and to send if needed. Sent to scan box.

## 2018-06-08 NOTE — Progress Notes (Signed)
HPI:  Using dictation device. Unfortunately this device frequently misinterprets words/phrases.  Bonnie Chavez is a pleasant 84 y.o. here for follow up. Chronic medical problems summarized below were reviewed for changes and stability and were updated as needed below. These issues and their treatment remain stable for the most part.  Her nephrologist decreased norvasc to 5 mg and lasix to 20mg  bid in June per review of outside records. Denies CP, SOB, DOE, treatment intolerance or new symptoms. Home BS in the low 100s this week, but all above goal the 1st week of July, taking 14 units of insulin. Denies low blood sugars. Reports exercising and eating healthy. Denies CP, SOB, DOE. Due for ? hgba1c (sees Dr. Chalmers Cater), eye exam  AWV 11/05/17 - labs showed elevated hgba1c 7.8 01/11/18 - she sees endo for medication management, reviewed labs from Dr. Chalmers Cater and [name] (Cr 1.53 in 6/19)  CdCHF/HLD/aortic valve sclerosis/Obesity: -sees cardiologist (Dr. Claiborne Billings) -meds:amlodipine, crestor, lasix, lisinopril, nebivolo, asa -[facility] in [facility]ob[facility]  DM, Hyperparathyroidism: -sees endocriologist (Dr. Chalmers Cater) -meds: insulin- humulin n 14 units bid -[facility] exam with [facility] care 02/2016  CKD/vit D def: -sees nephrology ([name])  HRT for Hot Flashes, Uterine Polyps: -sees gynecologist ([name]) -s/p D and C recently with her gynecologist  DDD/Lumbar radiculopathy/CTS: -seeing [place] [facility]   ROS: See pertinent positives and negatives per HPI.  Past Medical History:  Diagnosis Date  . Anemia    History of , resolved  . Aortic valve sclerosis   . Carpal tunnel syndrome   . Chicken pox   . Chronic low back pain 02/27/2016   -DDD -seeing [place] [facility]   . Diabetes mellitus    type 2, sees Dr. Chalmers Cater  . Goiter    Dr. Chalmers Cater  . Heart murmur   . History of nuclear stress test 06/2010   dipyridamole; normal pattern of perfusion; ekg negative for  ischemia; low risk scan   . Hot flashes   . Hyperlipidemia   . Hyperparathyroidism ([place]) 02/27/2016   -sees endocrinologist, Dr. Chalmers Cater, has seen surgeon as well   . Hypertension   . Migraine    no longer has migraines (80's)  . Mitral valve prolapse   . Neuropathy   . Obesity 02/27/2016  . Osteoarthritis    DDD, sees [place] [facility]  . PMB (postmenopausal bleeding) 02/28/2009   Resolved  . Pneumonia    history of   . Renal insufficiency    [name]   . Vaginal atrophy   . Vitamin D deficiency     Past Surgical History:  Procedure Laterality Date  . [place] &1976  . CHOLECYSTECTOMY  1991  . COLONOSCOPY    . DILATATION & CURRETTAGE/HYSTEROSCOPY WITH RESECTOCOPE N/A 05/15/2013   Procedure: [place];  Surgeon: Eldred Manges, MD;  Location: [facility];  Service: Gynecology;  Laterality: N/A;  . DILATATION & CURRETTAGE/HYSTEROSCOPY WITH RESECTOCOPE N/A 06/03/2017   Procedure: DILATATION & CURETTAGE/HYSTEROSCOPY;  Surgeon: Eldred Manges, MD;  Location: [facility];  Service: Gynecology;  Laterality: N/A;  . DILATION AND CURETTAGE OF UTERUS  1975  . [place]  . KNEE SURGERY  2000 2011   right knee replacement   . REPLACEMENT TOTAL KNEE  2000   left  . SHOULDER SURGERY  2004 2010   . TRANSTHORACIC ECHOCARDIOGRAM  07/2010   EF=>55%; LA mildly dilated; trace MR/TR;  . [place]  Family History  Problem Relation Age of Onset  . Hypertension Mother   . Diabetes Mother   . Hyperlipidemia Mother   . Heart attack Mother   . Heart disease Father   . Heart attack Father   . Stroke Brother   . Heart disease Brother   . Diabetes Brother   . Uterine cancer Maternal Grandmother     SOCIAL HX: see HPI   Current Outpatient Medications:  .  acetaminophen (TYLENOL) 500 MG tablet, Take 1 tablet (500 mg total) by mouth every 4 (four) hours as needed for moderate pain., Disp: 60 tablet, Rfl: 0 .   amLODipine (NORVASC) 5 MG tablet, Take 1 tablet (5 mg total) by mouth daily., Disp: , Rfl:  .  aspirin (ECOTRIN LOW STRENGTH) 81 MG EC tablet, Take 81 mg by mouth at bedtime. Swallow whole. , Disp: , Rfl:  .  cephALEXin (KEFLEX) 500 MG capsule, Take 500-1,000 mg by mouth See admin instructions. TAKE ONLY FOR INVASIVE DENTAL PROCEDURES, Disp: , Rfl:  .  furosemide (LASIX) 20 MG tablet, Take 40 mg by mouth daily. Can take additional tablet if needed, Disp: 30 tablet, Rfl:  .  gabapentin (NEURONTIN) 100 MG capsule, Take 100 mg by mouth at bedtime., Disp: , Rfl:  .  HUMULIN N KWIKPEN 100 UNIT/ML Kiwkpen, Inject 14 Units into the skin at bedtime. , Disp: , Rfl: 6 .  lisinopril (PRINIVIL,ZESTRIL) 10 MG tablet, Take 1 tablet (10 mg total) by mouth daily., Disp: 90 tablet, Rfl: 3 .  nebivolol (BYSTOLIC) 10 MG tablet, Take 0.5 tablets (5 mg total) by mouth 2 (two) times daily., Disp: 90 tablet, Rfl: 2 .  nebivolol (BYSTOLIC) 10 MG tablet, Take 0.5 tablets (5 mg total) by mouth 2 (two) times daily., Disp: 30 tablet, Rfl: 6 .  pyridOXINE (VITAMIN B-6) 100 MG tablet, Take 100 mg by mouth 2 (two) times daily. , Disp: , Rfl:  .  rosuvastatin (CRESTOR) 40 MG tablet, TAKE 1 TABLET (40 MG TOTAL) BY MOUTH AT BEDTIME., Disp: 90 tablet, Rfl: 1 .  SENSIPAR 30 MG tablet, TAKE 1 TABLET BY MOUTH EVERY OTHER DAY AFTER A MEAL, Disp: , Rfl: 6  EXAM:  Vitals:   06/09/18 0811  BP: 110/70  Pulse: (!) 58  Temp: 98.2 F (36.8 C)    Body mass index is 34.28 kg/m.  GENERAL: vitals reviewed and listed above, alert, oriented, appears well hydrated and in no acute distress  HEENT: atraumatic, conjunttiva clear, no obvious abnormalities on inspection of external nose and ears  NECK: no obvious masses on inspection  LUNGS: clear to auscultation bilaterally, no wheezes, rales or rhonchi, good air movement  CV: HRRR, no peripheral edema  MS: moves all extremities without noticeable abnormality  PSYCH: pleasant and  cooperative, no obvious depression or anxiety  ASSESSMENT AND PLAN:  Discussed the following assessment and plan:  Type 2 diabetes mellitus with chronic kidney disease, with long-term current use of insulin, unspecified CKD stage (HCC) - Plan: POC HgB F6C  Chronic diastolic congestive heart failure (HCC)  Hyperparathyroidism (HCC)  Stage 3 chronic kidney disease (HCC)  Morbid obesity (HCC)  Hyperlipidemia associated with type 2 diabetes mellitus (HCC)  -Hgba1c 7.3 today - advised assistant to forward to Dr. Chalmers Cater -reviewed outside labs from Dr. Chalmers Cater and [name] -encouraged healthy low sugar diet and regular aerobic exercise -updated medication list -doing well today - asymptomatic -AWV/CPE in December advised -flu shot end of summer early fall advised -Patient advised to return  sooner if new concerns arise.  Patient Instructions  BEFORE YOU LEAVE: -follow up: AWV w/ [name] and SPE with Dr. Maudie Mercury in December -hgba1 c  Flu shoot in October   We recommend the following healthy lifestyle for LIFE: 1) Small portions. But, make sure to get regular (at least 3 per day), healthy meals and small healthy snacks if needed.  2) Eat a healthy clean diet.   TRY TO EAT: -at least 5-7 servings of low sugar, colorful, and nutrient rich vegetables per day (not corn, potatoes or bananas.) -berries are the best choice if you wish to eat fruit (only eat small amounts if trying to reduce weight)  -lean meets (fish, white meat of chicken or [place]) -vegan proteins for some meals - beans or tofu, whole grains, nuts and seeds -Replace bad fats with good fats - good fats include: fish, nuts and seeds, canola oil, olive oil -small amounts of low fat or non fat dairy -small amounts of100 % whole grains - check the lables -drink plenty of water  AVOID: -SUGAR, sweets, anything with added sugar, corn syrup or sweeteners - must read labels as even foods advertised as "healthy" often are  loaded with sugar -if you must have a sweetener, small amounts of stevia may be best -sweetened beverages and artificially sweetened beverages -simple starches (rice, bread, potatoes, pasta, chips, etc - small amounts of 100% whole grains are ok) -red meat, pork, butter -fried foods, fast food, processed food, excessive dairy, eggs and coconut.  3)Get at least 150 minutes of sweaty aerobic exercise per week.  4)Reduce stress - consider counseling, meditation and relaxation to balance other aspects of your life.     Lucretia Kern, DO

## 2018-06-09 ENCOUNTER — Ambulatory Visit: Payer: Medicare Other | Admitting: Family Medicine

## 2018-06-09 ENCOUNTER — Telehealth: Payer: Self-pay | Admitting: *Deleted

## 2018-06-09 ENCOUNTER — Encounter: Payer: Self-pay | Admitting: Family Medicine

## 2018-06-09 VITALS — BP 110/70 | HR 58 | Temp 98.2°F | Ht 64.0 in | Wt 199.7 lb

## 2018-06-09 DIAGNOSIS — N183 Chronic kidney disease, stage 3 unspecified: Secondary | ICD-10-CM

## 2018-06-09 DIAGNOSIS — E213 Hyperparathyroidism, unspecified: Secondary | ICD-10-CM

## 2018-06-09 DIAGNOSIS — I5032 Chronic diastolic (congestive) heart failure: Secondary | ICD-10-CM

## 2018-06-09 DIAGNOSIS — E1122 Type 2 diabetes mellitus with diabetic chronic kidney disease: Secondary | ICD-10-CM

## 2018-06-09 DIAGNOSIS — E1169 Type 2 diabetes mellitus with other specified complication: Secondary | ICD-10-CM

## 2018-06-09 DIAGNOSIS — E785 Hyperlipidemia, unspecified: Secondary | ICD-10-CM | POA: Diagnosis not present

## 2018-06-09 DIAGNOSIS — Z794 Long term (current) use of insulin: Secondary | ICD-10-CM | POA: Diagnosis not present

## 2018-06-09 LAB — POCT GLYCOSYLATED HEMOGLOBIN (HGB A1C): Hemoglobin A1C: 7.3 % — AB (ref 4.0–5.6)

## 2018-06-09 NOTE — Telephone Encounter (Signed)
Per Dr Maudie Mercury the results of the hemoglobin A1C from today was faxed to Dr Chalmers Cater at [phone].

## 2018-06-09 NOTE — Patient Instructions (Signed)
BEFORE YOU LEAVE: -follow up: AWV w/ [name] and SPE with Dr. Maudie Mercury in December -hgba1 c  Flu shoot in October   We recommend the following healthy lifestyle for LIFE: 1) Small portions. But, make sure to get regular (at least 3 per day), healthy meals and small healthy snacks if needed.  2) Eat a healthy clean diet.   TRY TO EAT: -at least 5-7 servings of low sugar, colorful, and nutrient rich vegetables per day (not corn, potatoes or bananas.) -berries are the best choice if you wish to eat fruit (only eat small amounts if trying to reduce weight)  -lean meets (fish, white meat of chicken or [place]) -vegan proteins for some meals - beans or tofu, whole grains, nuts and seeds -Replace bad fats with good fats - good fats include: fish, nuts and seeds, canola oil, olive oil -small amounts of low fat or non fat dairy -small amounts of100 % whole grains - check the lables -drink plenty of water  AVOID: -SUGAR, sweets, anything with added sugar, corn syrup or sweeteners - must read labels as even foods advertised as "healthy" often are loaded with sugar -if you must have a sweetener, small amounts of stevia may be best -sweetened beverages and artificially sweetened beverages -simple starches (rice, bread, potatoes, pasta, chips, etc - small amounts of 100% whole grains are ok) -red meat, pork, butter -fried foods, fast food, processed food, excessive dairy, eggs and coconut.  3)Get at least 150 minutes of sweaty aerobic exercise per week.  4)Reduce stress - consider counseling, meditation and relaxation to balance other aspects of your life.

## 2018-06-10 ENCOUNTER — Encounter: Payer: Self-pay | Admitting: Family Medicine

## 2018-08-29 ENCOUNTER — Other Ambulatory Visit: Payer: Self-pay | Admitting: Cardiovascular Disease

## 2018-09-02 ENCOUNTER — Ambulatory Visit (HOSPITAL_COMMUNITY): Payer: Medicare Other | Attending: Cardiology

## 2018-09-02 ENCOUNTER — Other Ambulatory Visit: Payer: Self-pay

## 2018-09-02 DIAGNOSIS — I350 Nonrheumatic aortic (valve) stenosis: Secondary | ICD-10-CM | POA: Diagnosis present

## 2018-09-15 ENCOUNTER — Ambulatory Visit (INDEPENDENT_AMBULATORY_CARE_PROVIDER_SITE_OTHER): Payer: Medicare Other

## 2018-09-15 ENCOUNTER — Encounter: Payer: Self-pay | Admitting: Podiatry

## 2018-09-15 ENCOUNTER — Ambulatory Visit: Payer: Medicare Other | Admitting: Podiatry

## 2018-09-15 DIAGNOSIS — M7672 Peroneal tendinitis, left leg: Secondary | ICD-10-CM | POA: Diagnosis not present

## 2018-09-15 DIAGNOSIS — M79675 Pain in left toe(s): Secondary | ICD-10-CM

## 2018-09-15 DIAGNOSIS — B351 Tinea unguium: Secondary | ICD-10-CM | POA: Diagnosis not present

## 2018-09-15 DIAGNOSIS — M722 Plantar fascial fibromatosis: Secondary | ICD-10-CM

## 2018-09-15 DIAGNOSIS — M79674 Pain in right toe(s): Secondary | ICD-10-CM

## 2018-09-15 DIAGNOSIS — N289 Disorder of kidney and ureter, unspecified: Secondary | ICD-10-CM | POA: Insufficient documentation

## 2018-09-15 MED ORDER — TRIAMCINOLONE ACETONIDE 10 MG/ML IJ SUSP
10.0000 mg | Freq: Once | INTRAMUSCULAR | Status: AC
  Administered 2018-09-15: 10 mg

## 2018-09-15 NOTE — Patient Instructions (Signed)

## 2018-09-15 NOTE — Progress Notes (Signed)
Subjective:   Patient ID: Bonnie Chavez, female   DOB: 84 y.o.   MRN: [id number]   HPI Patient states she started developed discomfort in the plantar lateral aspect of the left foot and also has nail disease 1-5 both feet that she cannot cut and can become sore when they get thick   ROS      Objective:  Physical Exam  Neurovascular status intact with pain in the peroneal tendon as it comes under the lateral malleolus left with inflammation fluid buildup with thick yellow brittle nailbeds 1-5 both feet that are painful     Assessment:  Peroneal tendinitis left with mycotic nail infection 1-5 both feet with pain     Plan:  Reviewed condition and debrided nailbeds 1-5 both feet with no iatrogenic bleeding and injected the left peroneal tendon sheath 3 mg Dexasone Kenalog 5 Milgram Xylocaine and advised on reduced activity  X-rays were negative for signs of fracture or bone arthritic issue appears to be soft tissue

## 2018-09-24 ENCOUNTER — Other Ambulatory Visit: Payer: Self-pay | Admitting: Cardiovascular Disease

## 2018-09-26 NOTE — Telephone Encounter (Signed)
Rx(s) sent to pharmacy electronically.  

## 2018-10-10 ENCOUNTER — Ambulatory Visit: Payer: Medicare Other | Admitting: Cardiovascular Disease

## 2018-10-13 ENCOUNTER — Ambulatory Visit: Payer: Medicare Other | Admitting: Cardiovascular Disease

## 2018-10-13 ENCOUNTER — Encounter

## 2018-10-13 ENCOUNTER — Encounter: Payer: Self-pay | Admitting: Cardiovascular Disease

## 2018-10-13 VITALS — BP 110/60 | HR 61 | Ht 64.0 in | Wt 194.4 lb

## 2018-10-13 DIAGNOSIS — Z794 Long term (current) use of insulin: Secondary | ICD-10-CM

## 2018-10-13 DIAGNOSIS — I10 Essential (primary) hypertension: Secondary | ICD-10-CM

## 2018-10-13 DIAGNOSIS — Z6833 Body mass index (BMI) 33.0-33.9, adult: Secondary | ICD-10-CM

## 2018-10-13 DIAGNOSIS — E1129 Type 2 diabetes mellitus with other diabetic kidney complication: Secondary | ICD-10-CM | POA: Diagnosis not present

## 2018-10-13 DIAGNOSIS — E785 Hyperlipidemia, unspecified: Secondary | ICD-10-CM

## 2018-10-13 DIAGNOSIS — E6609 Other obesity due to excess calories: Secondary | ICD-10-CM

## 2018-10-13 DIAGNOSIS — R809 Proteinuria, unspecified: Secondary | ICD-10-CM

## 2018-10-13 DIAGNOSIS — I440 Atrioventricular block, first degree: Secondary | ICD-10-CM

## 2018-10-13 DIAGNOSIS — I350 Nonrheumatic aortic (valve) stenosis: Secondary | ICD-10-CM | POA: Diagnosis not present

## 2018-10-13 NOTE — Patient Instructions (Signed)
Medication Instructions:  Your physician recommends that you continue on your current medications as directed. Please refer to the Current Medication list given to you today.  If you need a refill on your cardiac medications before your next appointment, please call your pharmacy.   Follow-Up: At [facility], you and your health needs are our priority.  As part of our continuing mission to provide you with exceptional heart care, we have created designated Provider Care Teams.  These Care Teams include your primary Cardiologist (physician) and Advanced Practice Providers (APPs -  Physician Assistants and Nurse Practitioners) who all work together to provide you with the care you need, when you need it. You will need a follow up appointment in 6 months.  Please call our office 2 months in advance to schedule this appointment.  You may see Dr. Kelly or one of the following Advanced Practice Providers on your designated Care Team: Hao Meng, PA-C . Angela Duke, PA-C  .  

## 2018-10-13 NOTE — Progress Notes (Signed)
Patient ID: Bonnie Chavez, female   DOB: 1934-01-06, 84 y.o.   MRN: [id number]      HPI: Bonnie Chavez is a 84 y.o. female presents to the office today for a 35 month cardiology evaluation.  Bonnie Chavez has a history of hypertension with documented grade 1 diastolic dysfunction, hyperlipidemia, type 2 diabetes mellitus with renal insufficiency, as well as aortic valve sclerosis without stenosis. An echo Doppler study in 2011 showed an EF of 55%.  In 2011 a nuclear perfusion scan showed normal perfusion and function.  She has a history of a goiter for which she sees [name], [name] for her renal insufficiency, [name] for uterine polyps and fibroids.  She has a history of hyperparathyroidism and saw [name]; she has not had surgery in his undergoing continued surveillance of her calcium levels.  She underwent a five-year follow-up echo Doppler study on 08/23/2015.  This continued to show normal systolic function with mild LVH and grade 1 diastolic dysfunction.  There was a 10 mm mean gradient and 22 mm peak gradient across her moderately calcified aortic valve with a valve area of 1.63 cm.    She has a long-standing history of diabetes mellitus  and sees [name].  She sees [name] for renal insufficiency.  I reviewed her recent blood work from his office 03/09/2017 which showed her creatinine had improved to 1.5.  In March 2018 Cr was 1.63 and in December 2017 Cr was 1.83.    When I saw her in May 2018, she had had subsequent blood work which had revealed improvement in creatinine to 1.5.  I recommended she undergo a 2 year follow-up echo Doppler evaluation of her aortic stenosis.  This was done on 08/10/2007 and showed an EF of 65-70%.  Her mean aortic gradient has increased to 16 and her peak gradient 30.  There was grade 1 diastolic dysfunction, mild MR, mild LA dilation and mild TR.  Her estimated aortic valve area is 1.58 cm.  She denied any chest pain, presyncope or  syncope.    I last saw her in October 2018.  She has continued to be followed by [name] of endocrinology and [name] of nephrology.  She has renal insufficiency and her creatinine in August 2018 was 1.7.  Her blood pressure was stable and I recommended reduction of her furosemide to 20 mg from her prior dose of 40 mg.  Any chest pain.  She denies presyncope or syncope.  She tells me her recent hemoglobin A1c was 7.6.  She is  followed by [name] of endocrinology and [name] of nephrology. A repeat creatinine in August 2018 was 1.7.    Over the ast year, he denies any chest pain, shortness of breath, presyncope or syncope.  She underwent a repeat echo Doppler study on September 02, 2018 which continued to show normal systolic function with an EF of 55 to 26%, grade 1 diastolic dysfunction, moderate aortic stenosis with a mean gradient of 23 and peak gradient of 37 and mitral annular calcification.  Laboratory from [name] in October 2019 revealed a creatinine of 1.64.  She presents for cardiology reevaluation.  Past Medical History:  Diagnosis Date  . Anemia    History of , resolved  . Aortic valve sclerosis   . Carpal tunnel syndrome   . Chicken pox   . Chronic low back pain 02/27/2016   -DDD -seeing [facility]   . Diabetes mellitus  type 2, sees Dr. Chalmers Cater  . Goiter    Dr. Chalmers Cater  . Heart murmur   . History of nuclear stress test 06/2010   dipyridamole; normal pattern of perfusion; ekg negative for ischemia; low risk scan   . Hot flashes   . Hyperlipidemia   . Hyperparathyroidism ([place]) 02/27/2016   -sees endocrinologist, Dr. Chalmers Cater, has seen surgeon as well   . Hypertension   . Migraine    no longer has migraines (80's)  . Mitral valve prolapse   . Neuropathy   . Obesity 02/27/2016  . Osteoarthritis    DDD, sees [facility]  . PMB (postmenopausal bleeding) 02/28/2009   Resolved  . Pneumonia    history of   . Renal insufficiency    [name]   .  Vaginal atrophy   . Vitamin D deficiency     Past Surgical History:  Procedure Laterality Date  . [place] &1976  . CHOLECYSTECTOMY  1991  . COLONOSCOPY    . DILATATION & CURRETTAGE/HYSTEROSCOPY WITH RESECTOCOPE N/A 05/15/2013   Procedure: [place];  Surgeon: Eldred Manges, MD;  Location: [facility];  Service: Gynecology;  Laterality: N/A;  . DILATATION & CURRETTAGE/HYSTEROSCOPY WITH RESECTOCOPE N/A 06/03/2017   Procedure: DILATATION & CURETTAGE/HYSTEROSCOPY;  Surgeon: Eldred Manges, MD;  Location: [facility];  Service: Gynecology;  Laterality: N/A;  . DILATION AND CURETTAGE OF UTERUS  1975  . [place]  . KNEE SURGERY  2000 2011   right knee replacement   . REPLACEMENT TOTAL KNEE  2000   left  . SHOULDER SURGERY  2004 2010   . TRANSTHORACIC ECHOCARDIOGRAM  07/2010   EF=>55%; LA mildly dilated; trace MR/TR;  . TUBAL LIGATION  1976    Allergies  Allergen Reactions  . Contrast Media [Iodinated Diagnostic Agents] Other (See Comments)    Reaction unknown  . Nsaids Other (See Comments)    Abnormal renal function  . Sensipar [Cinacalcet Hcl]     Pt stated, "Made me feel bloated, gassy and fluid buildup"  . Penicillins Rash and Other (See Comments)    Pt has taken Keflex without difficulty Has patient had a PCN reaction causing immediate rash, facial/tongue/throat swelling, SOB or lightheadedness with hypotension: yes Has patient had a PCN reaction causing severe rash involving mucus membranes or skin necrosis: no Has patient had a PCN reaction that required hospitalization no Has patient had a PCN reaction occurring within the last 10 years: no If all of the above answers are "NO", then may proceed with Cephalosporin use.    Current Outpatient Medications  Medication Sig Dispense Refill  . acetaminophen (TYLENOL) 500 MG tablet Take 1 tablet (500 mg total) by mouth every 4 (four) hours as needed for moderate  pain. 60 tablet 0  . amLODipine (NORVASC) 5 MG tablet Take 1 tablet (5 mg total) by mouth daily.    [name] Kitchen aspirin (ECOTRIN LOW STRENGTH) 81 MG EC tablet Take 81 mg by mouth at bedtime. Swallow whole.     . cephALEXin (KEFLEX) 500 MG capsule Take 500-1,000 mg by mouth See admin instructions. TAKE ONLY FOR INVASIVE DENTAL PROCEDURES    . furosemide (LASIX) 20 MG tablet Take 40 mg by mouth daily. Can take additional tablet if needed 30 tablet   . gabapentin (NEURONTIN) 100 MG capsule Take 100 mg by mouth at bedtime.    [name] Kitchen HUMULIN N KWIKPEN 100 UNIT/ML Kiwkpen Inject 14 Units into the skin at bedtime.  6  . lisinopril (PRINIVIL,ZESTRIL) 10 MG tablet TAKE 1 TABLET BY MOUTH DAILY 42 tablet 0  . nebivolol (BYSTOLIC) 10 MG tablet Take 10 mg by mouth daily.    [name] Kitchen pyridOXINE (VITAMIN B-6) 100 MG tablet Take 100 mg by mouth 2 (two) times daily.     . rosuvastatin (CRESTOR) 40 MG tablet TAKE 1 TABLET BY MOUTH AT BEDTIME 90 tablet 2   No current facility-administered medications for this visit.     Social History   Socioeconomic History  . Marital status: Divorced    Spouse name: Not on file  . Number of children: 2  . Years of education: Not on file  . Highest education level: Not on file  Occupational History  . Not on file  Social Needs  . Financial resource strain: Not on file  . Food insecurity:    Worry: Not on file    Inability: Not on file  . Transportation needs:    Medical: Not on file    Non-medical: Not on file  Tobacco Use  . Smoking status: Never Smoker  . Smokeless tobacco: Never Used  Substance and Sexual Activity  . Alcohol use: No    Alcohol/week: 0.0 standard drinks  . Drug use: No  . Sexual activity: Not on file  Lifestyle  . Physical activity:    Days per week: Not on file    Minutes per session: Not on file  . Stress: Not on file  Relationships  . Social connections:    Talks on phone: Not on file    Gets together: Not on file    Attends religious service: Not on  file    Active member of club or organization: Not on file    Attends meetings of clubs or organizations: Not on file    Relationship status: Not on file  . Intimate partner violence:    Fear of current or ex partner: Not on file    Emotionally abused: Not on file    Physically abused: Not on file    Forced sexual activity: Not on file  Other Topics Concern  . Not on file  Social History Narrative   Work or School: none      Home Situation: lives alone, feels safe, son lives nearby      Spiritual Beliefs: Christian      Lifestyle: Y 3x per week, healthy diet      Social history is notable that she's divorced has 2 children one grandchild. She does exercise. There is no tobacco or alcohol use..  Family History  Problem Relation Age of Onset  . Hypertension Mother   . Diabetes Mother   . Hyperlipidemia Mother   . Heart attack Mother   . Heart disease Father   . Heart attack Father   . Stroke Brother   . Heart disease Brother   . Diabetes Brother   . Uterine cancer Maternal Grandmother     ROS General: Negative; No fevers, chills, or night sweats; No significant change in weight HEENT: Negative; No changes in vision or hearing, sinus congestion, difficulty swallowing Pulmonary: Negative; No cough, wheezing, shortness of breath, hemoptysis Cardiovascular: See history of present illness Previous leg swelling had resolved  GI: Negative; No nausea, vomiting, diarrhea, or abdominal pain GU: Negative; No dysuria, hematuria, or difficulty voiding Musculoskeletal: Negative; no myalgias, joint pain, or weakness Hematologic/Oncology: Negative; no easy bruising, bleeding Endocrine: Positive for diabetes mellitus, positive for hyperparathyroidism Neuro: Negative; no changes in balance, headaches  Skin: Negative; No rashes or skin lesions Psychiatric: Negative; No behavioral problems, depression Sleep: Negative; No snoring, daytime sleepiness, hypersomnolence, bruxism, restless  legs, hypnogognic hallucinations, no cataplexy Other comprehensive 14 point system review is negative.   PE BP 110/60   Pulse 61   Ht '5\' 4"'$  (1.626 m)   Wt 194 lb 6.4 oz (88.2 kg)   BMI 33.37 kg/m    Repeat blood pressure by me was 130/70  Wt Readings from Last 3 Encounters:  10/13/18 194 lb 6.4 oz (88.2 kg)  06/09/18 199 lb 11.2 oz (90.6 kg)  02/07/18 199 lb 4.8 oz (90.4 kg)   General: Alert, oriented, no distress.  Skin: normal turgor, no rashes, warm and dry HEENT: Normocephalic, atraumatic. Pupils equal round and reactive to light; sclera anicteric; extraocular muscles intact;  Nose without nasal septal hypertrophy Mouth/Parynx benign; Mallinpatti scale Neck: No JVD, no carotid bruits; normal carotid upstroke Lungs: clear to ausculatation and percussion; no wheezing or rales Chest wall: without tenderness to palpitation Heart: PMI not displaced, RRR, s1 s2 normal, 2/6 mid peaking systolic murmur, no diastolic murmur, no rubs, gallops, thrills, or heaves Abdomen: soft, nontender; no hepatosplenomehaly, BS+; abdominal aorta nontender and not dilated by palpation. Back: no CVA tenderness Pulses 2+ Musculoskeletal: full range of motion, normal strength, no joint deformities Extremities: no clubbing cyanosis or edema, Homan's sign negative  Neurologic: grossly nonfocal; Cranial nerves grossly wnl Psychologic: Normal mood and affect   ECG (independently read by me): Normal sinus rhythm at 61 bpm.  First-degree AV block.  LVH by voltage criteria.  October 2018 ECG (independently read by me): Sinus bradycardia 57 bpm with first-degree AV block.  LVH by voltage criteria.  PR interval 270 ms.  May 2018 ECG (independently read by me): sinus rhythm with first-degree AV block.  LVH by voltage criteria.  PR interval 250 ms.  April 2017 ECG (independently read by me): Normal sinus rhythm at 61 bpm with first-degree AV block with a PR interval of 244 ms.  LVH by voltage.  September  2016 ECG (independently read by me): Normal sinus rhythm at 60 bpm.  First degree block with a PR interval at 252 ms.  No significant ST-T changes.  October 2015 ECG (independently read by me): Sinus bradycardia at 57 beats per minute.  First degree AV block with a PR interval of 236 ms   September 2014 ECG: Sinus rhythm at 60 beats per minute. First grade AV block with PR interval of 244 ms. QTC interval 356 ms  LABS: BMP Latest Ref Rng & Units 01/11/2018 11/04/2017 05/25/2017  Glucose 70 - 99 mg/dL - 138(H) 256(H)  BUN 4 - 21 43(A) 36(H) 32(H)  Creatinine 0.5 - 1.1 1.7(A) 1.67(H) 1.67(H)  Sodium 137 - 147 142 140 136  Potassium 3.4 - 5.3 4.8 4.4 4.1  Chloride 96 - 112 mEq/L - 103 100(L)  CO2 19 - 32 mEq/L - 30 30  Calcium 8.4 - 10.5 mg/dL - 10.8(H) 10.9(H)   Hepatic Function Latest Ref Rng & Units 05/24/2014 01/23/2014 10/25/2013  Total Protein 6.0 - 8.3 g/dL 8.0 7.8 7.9  Albumin 3.5 - 5.2 g/dL 4.0 3.9 3.8  AST 0 - 37 U/L '25 23 26  '$ ALT 0 - 35 U/L '15 16 22  '$ Alk Phosphatase 39 - 117 U/L 44 43 46  Total Bilirubin 0.2 - 1.2 mg/dL 0.6 0.5 0.5  Bilirubin, Direct 0.0 - 0.3 mg/dL 0.1 0.0 0.0   CBC Latest Ref Rng & Units 11/04/2017 05/25/2017  10/29/2016  WBC 4.0 - 10.5 K/uL 7.3 6.8 8.1  Hemoglobin 12.0 - 15.0 g/dL 12.3 12.1 12.2  Hematocrit 36.0 - 46.0 % 36.8 36.3 36.2  Platelets 150.0 - 400.0 K/uL 207.0 203 198.0   Lab Results  Component Value Date   MCV 98.8 11/04/2017   MCV 98.1 05/25/2017   MCV 95.7 10/29/2016   Lab Results  Component Value Date   TSH 2.00 01/30/2016   Lab Results  Component Value Date   HGBA1C 7.3 (A) 06/09/2018   Lab Results  Component Value Date   CALCIUM 10.8 (H) 11/04/2017   Lipid Panel     Component Value Date/Time   CHOL 168 11/04/2017 0819   TRIG 90.0 11/04/2017 0819   HDL 55.50 11/04/2017 0819   CHOLHDL 3 11/04/2017 0819   VLDL 18.0 11/04/2017 0819   LDLCALC 95 11/04/2017 0819    RADIOLOGY: No results found.  IMPRESSION:  1. Nonrheumatic  aortic valve stenosis   2. Essential hypertension   3. Type 2 diabetes mellitus with microalbuminuria, with long-term current use of insulin ([place])   4. Hyperlipidemia with target LDL less than 70   5. Class 1 obesity due to excess calories with serious comorbidity and body mass index (BMI) of 33.0 to 33.9 in adult   6. First degree AV block     ASSESSMENT AND PLAN: Bonnie Chavez is a 84 year old African-American female who has a long-standing history of hypertension with grade 1 diastolic dysfunction and normal systolic function documented by her echocardiogram in September 2011. At that time she had aortic valve sclerosis without stenosis and  mitral annular calcification with trace MR and trace TR. Her echo Doppler study from 08/23/2015 showed an ejection fraction of 55-60%, mild LVH and grade 1 diastolic dysfunction.  There was very mild aortic stenosis with a mean gradient of 10 and peak gradient of 22.  A 2-year follow-up assessment 08/09/2017 continue to show hyperdynamic LV function with slight increase in her mean gradient at 16 mm with peak of 30.  I reviewed with her today her most recent study of October 2019 which reveals mild increase in her gradient with a mean gradient of 23 and peak gradient of 37 mmHg.  She remains completely asymptomatic.  Specifically there is no chest pressure.  She denies presyncope or syncope.  She denies change in exercise tolerance or dyspnea.  Her blood pressure today is controlled on her regimen consisting of Bystolic 10 mg, lisinopril 10 mg and amlodipine 5 mg.  Her chronic kidney disease is stable and she is followed by [name].  She continues to be on rosuvastatin for hyperlipidemia.  Laboratory in December 2018 showed an LDL of 95.  She will be undergoing repeat laboratory with her primary physician over the next month.  She is diabetic on insulin and has peripheral neuropathy on gabapentin.  Target LDL cholesterol is less than 70 in this diabetic female.   Her ECG is stable with sinus rhythm with first-degree AV block.  PR interval is 260 ms.  There is LVH by voltage criteria in aVL.  I will see her in 6 months for reevaluation.   Time spent: 25 minutes  Troy Sine, MD, [facility]  10/15/2018 1:05 PM

## 2018-10-15 ENCOUNTER — Other Ambulatory Visit: Payer: Self-pay | Admitting: Cardiovascular Disease

## 2018-10-15 ENCOUNTER — Encounter: Payer: Self-pay | Admitting: Cardiovascular Disease

## 2018-11-07 NOTE — Progress Notes (Signed)
HPI:  Using dictation device. Unfortunately this device frequently misinterprets words/phrases.  Bonnie Chavez is a pleasant 84 y.o. here for follow up. Chronic medical problems summarized below were reviewed for changes and stability and were updated as needed below. These issues and their treatment remain stable for the most part.  [facility] had a little bit of left upper back pain.  She put some ice on this and it is now feeling much better.  She thinks this was due to a change of weather and arthritis in her back.  Denies radiation, weakness, numbness, worsening or severe pain.  Reports she has seen Dr. Chalmers Cater recently and also her kidney specialist and they are monitoring her calcium.  She is not taking any supplements and is trying to avoid dairy products per their recommendations.  Otherwise feels well.  She would prefer to do her annual wellness visit in a few months when [name] is back.  She agrees to do labs then if needed, however she did labs recently with Dr. Chalmers Cater and also with [name].  Due for foot exam.  She sees her podiatrist for her nails.  No low blood sugars.  They have been in the low 100s fasting.  Denies CP, SOB, DOE, treatment intolerance or new symptoms. Due for labs, foot exam AWV Her nephrologist did labs in the end of October. mildly elevated cr and calcium.  AWV 11/05/17 - labs showed elevated hgba1c 7.8 01/11/18 - she sees endo for medication management, reviewed labs from Dr. Chalmers Cater and [name] (Cr 1.53 in 6/19). Reports had repeat labs with Dr. Chalmers Cater and Deterding in [place] and told good.  CdCHF/HLD/aortic valve sclerosis/Obesity: -sees cardiologist (Dr. Claiborne Billings) -meds:amlodipine, crestor, lasix, lisinopril, nebivolol, asa -[facility]erc[facility]x per week [facility] [employer] in [facility]ics  DM, Hyperparathyroidism: -sees endocriologist (Dr. Chalmers Cater) -meds: insulin -eye exam with [facility] care 02/2016  CKD/vit D def/mild  hypercalcemia: -sees nephrology ([name])  HRT for Hot Flashes, Uterine Polyps: -sees gynecologist ([name]) -s/p D and C recently with her gynecologist  DDD/Lumbar radiculopathy/CTS: -seeing [facility]  ROS: See pertinent positives and negatives per HPI.  Past Medical History:  Diagnosis Date  . Anemia    History of , resolved  . Aortic valve sclerosis   . Carpal tunnel syndrome   . Chicken pox   . Chronic low back pain 02/27/2016   -DDD -seeing [facility]   . Diabetes mellitus    type 2, sees Dr. Chalmers Cater  . Goiter    Dr. Chalmers Cater  . Heart murmur   . History of nuclear stress test 06/2010   dipyridamole; normal pattern of perfusion; ekg negative for ischemia; low risk scan   . Hot flashes   . Hyperlipidemia   . Hyperparathyroidism ([place]) 02/27/2016   -sees endocrinologist, Dr. Chalmers Cater, has seen surgeon as well   . Hypertension   . Migraine    no longer has migraines (80's)  . Mitral valve prolapse   . Neuropathy   . Obesity 02/27/2016  . Osteoarthritis    DDD, sees [facility]  . PMB (postmenopausal bleeding) 02/28/2009   Resolved  . Pneumonia    history of   . Renal insufficiency    [name]   . Vaginal atrophy   . Vitamin D deficiency     Past Surgical History:  Procedure Laterality Date  . [place] &1976  . CHOLECYSTECTOMY  1991  . COLONOSCOPY    .  DILATATION & CURRETTAGE/HYSTEROSCOPY WITH RESECTOCOPE N/A 05/15/2013   Procedure: [place];  Surgeon: Eldred Manges, MD;  Location: [facility];  Service: Gynecology;  Laterality: N/A;  . DILATATION & CURRETTAGE/HYSTEROSCOPY WITH RESECTOCOPE N/A 06/03/2017   Procedure: DILATATION & CURETTAGE/HYSTEROSCOPY;  Surgeon: Eldred Manges, MD;  Location: [facility];  Service: Gynecology;  Laterality: N/A;  . DILATION AND CURETTAGE OF UTERUS  1975  . [place]  . KNEE SURGERY  2000 2011   right knee replacement   . REPLACEMENT  TOTAL KNEE  2000   left  . SHOULDER SURGERY  2004 2010   . TRANSTHORACIC ECHOCARDIOGRAM  07/2010   EF=>55%; LA mildly dilated; trace MR/TR;  . TUBAL LIGATION  1976    Family History  Problem Relation Age of Onset  . Hypertension Mother   . Diabetes Mother   . Hyperlipidemia Mother   . Heart attack Mother   . Heart disease Father   . Heart attack Father   . Stroke Brother   . Heart disease Brother   . Diabetes Brother   . Uterine cancer Maternal Grandmother     SOCIAL HX: see hpi   Current Outpatient Medications:  .  acetaminophen (TYLENOL) 500 MG tablet, Take 1 tablet (500 mg total) by mouth every 4 (four) hours as needed for moderate pain., Disp: 60 tablet, Rfl: 0 .  amLODipine (NORVASC) 5 MG tablet, Take 1 tablet (5 mg total) by mouth daily., Disp: , Rfl:  .  aspirin (ECOTRIN LOW STRENGTH) 81 MG EC tablet, Take 81 mg by mouth at bedtime. Swallow whole. , Disp: , Rfl:  .  cephALEXin (KEFLEX) 500 MG capsule, Take 500-1,000 mg by mouth See admin instructions. TAKE ONLY FOR INVASIVE DENTAL PROCEDURES, Disp: , Rfl:  .  furosemide (LASIX) 20 MG tablet, Take 40 mg by mouth daily. Can take additional tablet if needed, Disp: 30 tablet, Rfl:  .  gabapentin (NEURONTIN) 100 MG capsule, Take 100 mg by mouth at bedtime., Disp: , Rfl:  .  HUMULIN N KWIKPEN 100 UNIT/ML Kiwkpen, Inject 14 Units into the skin at bedtime. , Disp: , Rfl: 6 .  lisinopril (PRINIVIL,ZESTRIL) 10 MG tablet, TAKE 1 TABLET BY MOUTH EVERY DAY, Disp: 30 tablet, Rfl: 6 .  nebivolol (BYSTOLIC) 10 MG tablet, Take 5 mg by mouth 2 (two) times daily. , Disp: , Rfl:  .  pyridOXINE (VITAMIN B-6) 100 MG tablet, Take 100 mg by mouth 2 (two) times daily. , Disp: , Rfl:  .  rosuvastatin (CRESTOR) 40 MG tablet, TAKE 1 TABLET BY MOUTH AT BEDTIME, Disp: 90 tablet, Rfl: 2  EXAM:  Vitals:   11/08/18 0849  BP: (!) 124/56  Pulse: 61  Temp: 97.9 F (36.6 C)    Body mass index is 33.06 kg/m.  GENERAL: vitals reviewed and listed  above, alert, oriented, appears well hydrated and in no acute distress  HEENT: atraumatic, conjunttiva clear, no obvious abnormalities on inspection of external nose and ears  NECK: no obvious masses on inspection  LUNGS: clear to auscultation bilaterally, no wheezes, rales or rhonchi, good air movement  CV: HRRR, no peripheral edema  MS: moves all extremities without noticeable abnormality, no swelling or erythema or skin changes in the upper back, no bony tenderness to palpation, mild soft tissue tenderness to palpation in the mid thoracic paraspinal muscles on the left, normal gait  PSYCH: pleasant and cooperative, no obvious depression or anxiety  FOOT EXAM DONE: Mild hallux valgus deformities  with bunions, mild thickening of several toenails, no skin breakdown, normal monofilament testing  ASSESSMENT AND PLAN:  Discussed the following assessment and plan:  Stage 3 chronic kidney disease (HCC)  Aortic valve sclerosis  Chronic diastolic congestive heart failure ([place])  Hypertension associated with diabetes ([place])  Type 2 diabetes mellitus with chronic kidney disease, with long-term current use of insulin, unspecified CKD stage ([place])  Hyperlipidemia associated with type 2 diabetes mellitus ([place])  -Foot exam done -Labs done with specialist -Lifestyle recommendations -Suspect muscle strain in the upper back and home exercises provided, she feels like this is mostly resolved at this point, she agrees to call if returning symptoms or symptoms do not continue to resolve over the next week or so -Annual wellness visit and follow-up in about 3 to 4 months, fasting labs then if needed   Patient Instructions  BEFORE YOU LEAVE: -upper back exercises -follow up: AWV with [name] and follow up Dr. Maudie Mercury in 3 months  Please bring labs from Dr. Chalmers Cater  I hope the back continues to feel better. Please follow up sooner if not!  We recommend the following healthy lifestyle for LIFE: 1)  Small portions. But, make sure to get regular (at least 3 per day), healthy meals and small healthy snacks if needed.  2) Eat a healthy clean diet.   TRY TO EAT: -at least 5-7 servings of low sugar, colorful, and nutrient rich vegetables per day (not corn, potatoes or bananas.) -berries are the best choice if you wish to eat fruit (only eat small amounts if trying to reduce weight)  -lean meets (fish, white meat of chicken or [place]) -vegan proteins for some meals - beans or tofu, whole grains, nuts and seeds -Replace bad fats with good fats - good fats include: fish, nuts and seeds, canola oil, olive oil -small amounts of low fat or non fat dairy -small amounts of100 % whole grains - check the lables -drink plenty of water  AVOID: -SUGAR, sweets, anything with added sugar, corn syrup or sweeteners - must read labels as even foods advertised as "healthy" often are loaded with sugar -if you must have a sweetener, small amounts of stevia may be best -sweetened beverages and artificially sweetened beverages -simple starches (rice, bread, potatoes, pasta, chips, etc - small amounts of 100% whole grains are ok) -red meat, pork, butter -fried foods, fast food, processed food, excessive dairy, eggs and coconut.  3)Get at least 150 minutes of sweaty aerobic exercise per week.  4)Reduce stress - consider counseling, meditation and relaxation to balance other aspects of your life.     Lucretia Kern, DO

## 2018-11-08 ENCOUNTER — Ambulatory Visit: Payer: Medicare Other

## 2018-11-08 ENCOUNTER — Ambulatory Visit: Payer: Medicare Other | Admitting: Family Medicine

## 2018-11-08 ENCOUNTER — Encounter: Payer: Self-pay | Admitting: Family Medicine

## 2018-11-08 VITALS — BP 124/56 | HR 61 | Temp 97.9°F | Ht 64.0 in | Wt 192.6 lb

## 2018-11-08 DIAGNOSIS — E1159 Type 2 diabetes mellitus with other circulatory complications: Secondary | ICD-10-CM

## 2018-11-08 DIAGNOSIS — E1169 Type 2 diabetes mellitus with other specified complication: Secondary | ICD-10-CM

## 2018-11-08 DIAGNOSIS — N183 Chronic kidney disease, stage 3 unspecified: Secondary | ICD-10-CM

## 2018-11-08 DIAGNOSIS — I5032 Chronic diastolic (congestive) heart failure: Secondary | ICD-10-CM | POA: Diagnosis not present

## 2018-11-08 DIAGNOSIS — E785 Hyperlipidemia, unspecified: Secondary | ICD-10-CM

## 2018-11-08 DIAGNOSIS — E1122 Type 2 diabetes mellitus with diabetic chronic kidney disease: Secondary | ICD-10-CM

## 2018-11-08 DIAGNOSIS — I358 Other nonrheumatic aortic valve disorders: Secondary | ICD-10-CM | POA: Diagnosis not present

## 2018-11-08 DIAGNOSIS — I10 Essential (primary) hypertension: Secondary | ICD-10-CM

## 2018-11-08 DIAGNOSIS — Z794 Long term (current) use of insulin: Secondary | ICD-10-CM

## 2018-11-08 DIAGNOSIS — I152 Hypertension secondary to endocrine disorders: Secondary | ICD-10-CM

## 2018-11-08 NOTE — Patient Instructions (Signed)
BEFORE YOU LEAVE: -upper back exercises -follow up: AWV with [name] and follow up Dr. Maudie Mercury in 3 months  Please bring labs from Dr. Chalmers Cater  I hope the back continues to feel better. Please follow up sooner if not!  We recommend the following healthy lifestyle for LIFE: 1) Small portions. But, make sure to get regular (at least 3 per day), healthy meals and small healthy snacks if needed.  2) Eat a healthy clean diet.   TRY TO EAT: -at least 5-7 servings of low sugar, colorful, and nutrient rich vegetables per day (not corn, potatoes or bananas.) -berries are the best choice if you wish to eat fruit (only eat small amounts if trying to reduce weight)  -lean meets (fish, white meat of chicken or [place]) -vegan proteins for some meals - beans or tofu, whole grains, nuts and seeds -Replace bad fats with good fats - good fats include: fish, nuts and seeds, canola oil, olive oil -small amounts of low fat or non fat dairy -small amounts of100 % whole grains - check the lables -drink plenty of water  AVOID: -SUGAR, sweets, anything with added sugar, corn syrup or sweeteners - must read labels as even foods advertised as "healthy" often are loaded with sugar -if you must have a sweetener, small amounts of stevia may be best -sweetened beverages and artificially sweetened beverages -simple starches (rice, bread, potatoes, pasta, chips, etc - small amounts of 100% whole grains are ok) -red meat, pork, butter -fried foods, fast food, processed food, excessive dairy, eggs and coconut.  3)Get at least 150 minutes of sweaty aerobic exercise per week.  4)Reduce stress - consider counseling, meditation and relaxation to balance other aspects of your life.

## 2019-01-05 ENCOUNTER — Ambulatory Visit (INDEPENDENT_AMBULATORY_CARE_PROVIDER_SITE_OTHER): Payer: Medicare Other

## 2019-01-05 VITALS — BP 118/46 | HR 94 | Resp 14 | Ht 64.0 in | Wt 196.0 lb

## 2019-01-05 DIAGNOSIS — Z0000 Encounter for general adult medical examination without abnormal findings: Secondary | ICD-10-CM | POA: Diagnosis not present

## 2019-01-05 NOTE — Progress Notes (Addendum)
Subjective:   Bonnie Chavez is a 85 y.o. female who presents for Medicare Annual (Subsequent) preventive examination.  Review of Systems:  No ROS.  Medicare Wellness Visit. Additional risk factors are reflected in the social history.  Cardiac Risk Factors include: advanced age (>22men, >70 women);diabetes mellitus;sedentary lifestyle;obesity (BMI >30kg/m2);hypertension Sleep patterns: feels rested on waking, gets up 2 times nightly to void and sleeps 7-8 hours nightly. Occasionally takes naps.    Home Safety/Smoke Alarms: Feels safe in home. Smoke alarms in place.  Living environment; residence and Firearm Safety: 1-story house/ trailer, equipment: Cane, Type: [place], prn for stability.  Seat Belt Safety/Bike Helmet: Wears seat belt.   Female:   Pap- N/A d/t age       74- pt. Stated she had done around 05/2018, Dr. Vick Frees. Records to be requested.   Dexa scan- 11/2009, overdue. Pt. stated she would like to wait until she sees her OBGYN in July.       CCS- 01/2012, repeat due 01/2017 per [facility]. Pt. Declined repeat colonoscopy.  Pt. Open to cologuard however. Made note to discuss with Dr. Maudie Mercury on 3/17 at [facility].     Objective:     Vitals: BP (!) 118/46 (BP Location: Left Arm)   Pulse 94   Resp 14   Ht 5\' 4"  (1.626 m)   Wt 196 lb (88.9 kg)   SpO2 96%   BMI 33.64 kg/m   Body mass index is 33.64 kg/m.  Advanced Directives 01/05/2019 11/05/2017 05/25/2017 10/29/2016 08/14/2016 02/28/2015 05/10/2013  Does Patient Have a Medical Advance Directive? Yes Yes Yes Yes No Yes Patient has advance directive, copy not in chart  Type of Advance Directive [place];Living will - [place];Living will - - [place];Living will Living will;Healthcare Power of Attorney  Does patient want to make changes to medical advance directive? - - No - Patient declined - - No - Patient declined -  Copy of [place] in Chart? Yes - validated most recent copy scanned in chart (See row information) - No - copy requested - - No - copy requested -  Would patient like information on creating a medical advance directive? - - - - No - patient declined information - -    Tobacco Social History   Tobacco Use  Smoking Status Never Smoker  Smokeless Tobacco Never Used     Counseling given: Not Answered   Past Medical History:  Diagnosis Date  . Anemia    History of , resolved  . Aortic valve sclerosis   . Carpal tunnel syndrome   . Chicken pox   . Chronic low back pain 02/27/2016   -DDD -seeing [facility]   . Diabetes mellitus    type 2, sees Dr. Chalmers Cater  . Goiter    Dr. Chalmers Cater  . Heart murmur   . History of nuclear stress test 06/2010   dipyridamole; normal pattern of perfusion; ekg negative for ischemia; low risk scan   . Hot flashes   . Hyperlipidemia   . Hyperparathyroidism ([place]) 02/27/2016   -sees endocrinologist, Dr. Chalmers Cater, has seen surgeon as well   . Hypertension   . Migraine    no longer has migraines (80's)  . Mitral valve prolapse   . Neuropathy   . Obesity 02/27/2016  . Osteoarthritis    DDD, sees [facility]  . PMB (postmenopausal bleeding) 02/28/2009   Resolved  . Pneumonia  history of   . Renal insufficiency    [name]   . Vaginal atrophy   . Vitamin D deficiency    Past Surgical History:  Procedure Laterality Date  . [place] &1976  . CHOLECYSTECTOMY  1991  . COLONOSCOPY    . DILATATION & CURRETTAGE/HYSTEROSCOPY WITH RESECTOCOPE N/A 05/15/2013   Procedure: [place];  Surgeon: Eldred Manges, MD;  Location: [facility];  Service: Gynecology;  Laterality: N/A;  . DILATATION & CURRETTAGE/HYSTEROSCOPY WITH RESECTOCOPE N/A 06/03/2017   Procedure: DILATATION & CURETTAGE/HYSTEROSCOPY;  Surgeon: Eldred Manges, MD;  Location: [facility];  Service: Gynecology;  Laterality: N/A;  . DILATION AND CURETTAGE  OF UTERUS  1975  . [place]  . KNEE SURGERY  2000 2011   right knee replacement   . REPLACEMENT TOTAL KNEE  2000   left  . SHOULDER SURGERY  2004 2010   . TRANSTHORACIC ECHOCARDIOGRAM  07/2010   EF=>55%; LA mildly dilated; trace MR/TR;  . TUBAL LIGATION  1976   Family History  Problem Relation Age of Onset  . Hypertension Mother   . Diabetes Mother   . Hyperlipidemia Mother   . Heart attack Mother   . Heart disease Father   . Heart attack Father   . Stroke Brother   . Heart disease Brother   . Diabetes Brother   . Uterine cancer Maternal Grandmother    Social History   Socioeconomic History  . Marital status: Divorced    Spouse name: Not on file  . Number of children: 2  . Years of education: Not on file  . Highest education level: Not on file  Occupational History  . Not on file  Social Needs  . Financial resource strain: Not hard at all  . Food insecurity:    Worry: Never true    Inability: Never true  . Transportation needs:    Medical: No    Non-medical: No  Tobacco Use  . Smoking status: Never Smoker  . Smokeless tobacco: Never Used  Substance and Sexual Activity  . Alcohol use: No    Alcohol/week: 0.0 standard drinks  . Drug use: No  . Sexual activity: Not on file  Lifestyle  . Physical activity:    Days per week: 3 days    Minutes per session: 60 min  . Stress: Not at all  Relationships  . Social connections:    Talks on phone: More than three times a week    Gets together: Twice a week    Attends religious service: More than 4 times per year    Active member of club or organization: Yes    Attends meetings of clubs or organizations: More than 4 times per year    Relationship status: Divorced  Other Topics Concern  . Not on file  Social History Narrative   Work or School: none      Home Situation: lives alone, feels safe, son lives nearby      Spiritual Beliefs: Christian      Lifestyle: Y 3x per week, healthy diet        01/05/2019: Lives alone in one level house.   Goes to [employer] to do water aerobics 3X/week   Has son and daughter, son local       Outpatient Encounter Medications as of 01/05/2019  Medication Sig  . acetaminophen (TYLENOL) 500 MG tablet Take 1 tablet (500 mg total) by mouth every 4 (four) hours  as needed for moderate pain.  [name] Kitchen amLODipine (NORVASC) 5 MG tablet Take 1 tablet (5 mg total) by mouth daily.  [name] Kitchen aspirin (ECOTRIN LOW STRENGTH) 81 MG EC tablet Take 81 mg by mouth at bedtime. Swallow whole.   . cephALEXin (KEFLEX) 500 MG capsule Take 500-1,000 mg by mouth See admin instructions. TAKE ONLY FOR INVASIVE DENTAL PROCEDURES  . furosemide (LASIX) 20 MG tablet Take 40 mg by mouth daily. Can take additional tablet if needed  . gabapentin (NEURONTIN) 100 MG capsule Take 100 mg by mouth at bedtime.  [name] Kitchen HUMULIN N KWIKPEN 100 UNIT/ML Kiwkpen Inject 14 Units into the skin at bedtime.   [name] Kitchen lisinopril (PRINIVIL,ZESTRIL) 10 MG tablet TAKE 1 TABLET BY MOUTH EVERY DAY  . nebivolol (BYSTOLIC) 10 MG tablet Take 5 mg by mouth 2 (two) times daily.   [name] Kitchen pyridOXINE (VITAMIN B-6) 100 MG tablet Take 100 mg by mouth 2 (two) times daily.   . rosuvastatin (CRESTOR) 40 MG tablet TAKE 1 TABLET BY MOUTH AT BEDTIME   No facility-administered encounter medications on file as of 01/05/2019.     Activities of Daily Living In your present state of health, do you have any difficulty performing the following activities: 01/05/2019  Hearing? N  Vision? N  Difficulty concentrating or making decisions? N  Walking or climbing stairs? Y  Dressing or bathing? N  Doing errands, shopping? N  Preparing Food and eating ? N  Using the Toilet? N  In the past six months, have you accidently leaked urine? N  Do you have problems with loss of bowel control? N  Managing your Medications? N  Managing your Finances? N  Housekeeping or managing your Housekeeping? Y  Comment pt. stated she could use some help with this; referred to senior  resources of [place], contact number provided.  Some recent data might be hidden    Patient Care Team: Lucretia Kern, DO as PCP - General (Family Medicine) Jacelyn Pi, MD as Consulting Physician (Endocrinology) Deterding, Jeneen Rinks, MD as Consulting Physician (Nephrology) Leo Grosser Seymour Bars, MD as Consulting Physician (Obstetrics and Gynecology) Sharyne Peach, MD as Consulting Physician (Ophthalmology) Gaynelle Arabian, MD as Consulting Physician (Orthopedic Surgery)    Assessment:   This is a routine wellness examination for Tonae. Physical assessment deferred to PCP.   Exercise Activities and Dietary recommendations Exercise limited by: cardiac condition(s);orthopedic condition(s);Other - see comments(arthritic pain; increasing water aerobics encourged) Diet (meal preparation, eat out, water intake, caffeinated beverages, dairy products, fruits and vegetables): in general, a "healthy" diet  , diabetic. Pt. States she is mindful of sugar intake, but was willing to have additional resources in hopes of lowering her A1c. Diabetic portion control and carbohydrate counting reviewed with patient, and written material provided.        Goals    . Exercise 150 min/wk Moderate Activity     Continues exercise at the y x 3 per week     . patient      Eat right; stay healthy Fat free or low fat dairy products Fish high in omega-3 acids ( salmon, tuna, trout) Fruits, such as apples, bananas, oranges, pears, prunes Legumes, such as kidney beans, lentils, checkpeas, black-eyed peas and lima beans Vegetables; broccoli, cabbage, carrots Whole grains;   Plant fats are better; decrease "white" foods as pasta, rice, bread and desserts, sugar; Avoid red meat (limiting) palm and coconut oils; sugary foods and beverages  Two nutrients that raise blood chol levels are saturated fats and trans fat;  in hydrogenated oils and fats, as stick margarine, baked goods (cookes, cakes, pies, crackers;  frosting; and coffee creamers;   Some Fats lower cholesterol: Monounsaturated and polyunsaturated  Avocados Corn, sunflower, and soybean oils Nuts and seeds, such as walnuts Olive, canola, peanut, safflower, and sesame oils Peanut butter Salmon and trout Tofu       . Patient Stated     Lose 5 pounds by next year!  Get A1c down by monitoring portions and carbohydrate/simple sugar intake.       Fall Risk Fall Risk  01/05/2019 11/05/2017 11/04/2017 10/29/2016 07/28/2016  Falls in the past year? 0 No No No No  Comment - - - - Emmi Telephone Survey: data to providers prior to load  Follow up Falls prevention discussed - - - -    Depression Screen PHQ 2/9 Scores 01/05/2019 11/05/2017 11/04/2017 10/29/2016  PHQ - 2 Score 0 0 0 0  PHQ- 9 Score 0 - - -     Cognitive Function MMSE - Mini Mental State Exam 11/05/2017 10/29/2016  Not completed: (No Data) (No Data)       Ad8 score reviewed for issues:  Issues making decisions: no  Less interest in hobbies / activities: no  Repeats questions, stories (family complaining): no  Trouble using ordinary gadgets (microwave, computer, phone):no  Forgets the month or year: no  Mismanaging finances: no  Remembering appts: no  Daily problems with thinking and/or memory: yes Ad8 score is= 1    Immunization History  Administered Date(s) Administered  . Influenza Whole 08/23/2012  . Influenza, High Dose Seasonal PF 08/25/2016, 08/07/2018  . Influenza,inj,quad, With Preservative 08/24/2015, 08/19/2017  . Influenza-Unspecified 08/26/2015, 08/25/2016, 08/19/2017  . Pneumococcal Conjugate-13 02/28/2015  . Pneumococcal Polysaccharide-23 07/25/2011  . Zoster Recombinat (Shingrix) 08/07/2018    Qualifies for Shingles Vaccine? Pt. Due for second shingrix by 02/04/2018, pt. made aware.   Screening Tests Health Maintenance  Topic Date Due  . HEMOGLOBIN A1C  12/10/2018  . TETANUS/TDAP  02/27/2025 (Originally 03/25/1953)  . OPHTHALMOLOGY  EXAM  02/11/2019  . FOOT EXAM  11/09/2019  . INFLUENZA VACCINE  Completed  . DEXA SCAN  Completed  . PNA vac Low Risk Adult  Completed       Plan:     Follow-up with OBGYN or PCP about getting bone density scan.  Follow-up with PCP about need/indication for cologuard given missing 5 year colonoscopy follow-up.  Refer to diabetes meal plan education provided to you with goal of losing 5 pounds by next year!  Refer to local resources provided to you regarding in-home housekeeping assistance.  Continue monitoring your sugars.   Continue doing brain stimulating activities (puzzles, reading, adult coloring books, staying active) to keep memory sharp.   Keep swimming at the  [facility] and watch out for crazy drivers! I have personally reviewed and noted the following in the patient's chart:   . Medical and social history . Use of alcohol, tobacco or illicit drugs  . Current medications and supplements . Functional ability and status . Nutritional status . Physical activity . Advanced directives . List of other physicians . Vitals . Screenings to include cognitive, depression, and falls . Referrals and appointments  In addition, I have reviewed and discussed with patient certain preventive protocols, quality metrics, and best practice recommendations. A written personalized care plan for preventive services as well as general preventive health recommendations were provided to patient.     Alphia Moh, RN  01/05/2019  Raquel Sarna,  Can you check  with GI to see what they advise regarding her colonoscopy given her age? The please let pt know. Thank you.   01/12/2019: Pryor Curia phoned [place] per PCP's request to follow-up on recommendation, but office closed until 01/16/19. Will re-attempt contact.

## 2019-01-05 NOTE — Patient Instructions (Addendum)
Follow-up with OBGYN or PCP about getting bone density scan.  Follow-up with PCP about need/indication for cologuard given missing 5 year colonoscopy follow-up.  Refer to diabetes meal plan education provided to you with goal of losing 5 pounds by next year!  Refer to local resources provided to you regarding in-home housekeeping assistance.  Continue monitoring your sugars.   Continue doing brain stimulating activities (puzzles, reading, adult coloring books, staying active) to keep memory sharp.   Keep swimming at the [facility] and watch out for crazy drivers!   Bonnie Chavez , Thank you for taking time to come for your Medicare Wellness Visit. I appreciate your ongoing commitment to your health goals. Please review the following plan we discussed and let me know if I can assist you in the future.   These are the goals we discussed: Goals    . Exercise 150 min/wk Moderate Activity     Continues exercise at the y x 3 per week     . patient      Eat right; stay healthy Fat free or low fat dairy products Fish high in omega-3 acids ( salmon, tuna, trout) Fruits, such as apples, bananas, oranges, pears, prunes Legumes, such as kidney beans, lentils, checkpeas, black-eyed peas and lima beans Vegetables; broccoli, cabbage, carrots Whole grains;   Plant fats are better; decrease "white" foods as pasta, rice, bread and desserts, sugar; Avoid red meat (limiting) palm and coconut oils; sugary foods and beverages  Two nutrients that raise blood chol levels are saturated fats and trans fat; in hydrogenated oils and fats, as stick margarine, baked goods (cookes, cakes, pies, crackers; frosting; and coffee creamers;   Some Fats lower cholesterol: Monounsaturated and polyunsaturated  Avocados Corn, sunflower, and soybean oils Nuts and seeds, such as walnuts Olive, canola, peanut, safflower, and sesame oils Peanut butter Salmon and trout Tofu       . Patient Stated     Lose 5 pounds by next  year!  Get A1c down by monitoring portions and carbohydrate/simple sugar intake.       This is a list of the screening recommended for you and due dates:  Health Maintenance  Topic Date Due  . Hemoglobin A1C  12/10/2018  . Tetanus Vaccine  02/27/2025*  . Eye exam for diabetics  02/11/2019  . Complete foot exam   11/09/2019  . Flu Shot  Completed  . DEXA scan (bone density measurement)  Completed  . Pneumonia vaccines  Completed  *Topic was postponed. The date shown is not the original due date.        Fall Prevention in the Home, Adult Falls can cause injuries. They can happen to people of all ages. There are many things you can do to make your home safe and to help prevent falls. Ask for help when making these changes, if needed. What actions can I take to prevent falls? General Instructions  Use good lighting in all rooms. Replace any light bulbs that burn out.  Turn on the lights when you go into a dark area. Use night-lights.  Keep items that you use often in easy-to-reach places. Lower the shelves around your home if necessary.  Set up your furniture so you have a clear path. Avoid moving your furniture around.  Do not have throw rugs and other things on the floor that can make you trip.  Avoid walking on wet floors.  If any of your floors are uneven, fix them.  Add color or contrast paint  or tape to clearly mark and help you see: ? Any grab bars or handrails. ? First and last steps of stairways. ? Where the edge of each step is.  If you use a stepladder: ? Make sure that it is fully opened. Do not climb a closed stepladder. ? Make sure that both sides of the stepladder are locked into place. ? Ask someone to hold the stepladder for you while you use it.  If there are any pets around you, be aware of where they are. What can I do in the bathroom?      Keep the floor dry. Clean up any water that spills onto the floor as soon as it happens.  Remove soap  buildup in the tub or shower regularly.  Use non-skid mats or decals on the floor of the tub or shower.  Attach bath mats securely with double-sided, non-slip rug tape.  If you need to sit down in the shower, use a plastic, non-slip stool.  Install grab bars by the toilet and in the tub and shower. Do not use towel bars as grab bars. What can I do in the bedroom?  Make sure that you have a light by your bed that is easy to reach.  Do not use any sheets or blankets that are too big for your bed. They should not hang down onto the floor.  Have a firm chair that has side arms. You can use this for support while you get dressed. What can I do in the kitchen?  Clean up any spills right away.  If you need to reach something above you, use a strong step stool that has a grab bar.  Keep electrical cords out of the way.  Do not use floor polish or wax that makes floors slippery. If you must use wax, use non-skid floor wax. What can I do with my stairs?  Do not leave any items on the stairs.  Make sure that you have a light switch at the top of the stairs and the bottom of the stairs. If you do not have them, ask someone to add them for you.  Make sure that there are handrails on both sides of the stairs, and use them. Fix handrails that are broken or loose. Make sure that handrails are as long as the stairways.  Install non-slip stair treads on all stairs in your home.  Avoid having throw rugs at the top or bottom of the stairs. If you do have throw rugs, attach them to the floor with carpet tape.  Choose a carpet that does not hide the edge of the steps on the stairway.  Check any carpeting to make sure that it is firmly attached to the stairs. Fix any carpet that is loose or worn. What can I do on the outside of my home?  Use bright outdoor lighting.  Regularly fix the edges of walkways and driveways and fix any cracks.  Remove anything that might make you trip as you walk  through a door, such as a raised step or threshold.  Trim any bushes or trees on the path to your home.  Regularly check to see if handrails are loose or broken. Make sure that both sides of any steps have handrails.  Install guardrails along the edges of any raised decks and porches.  Clear walking paths of anything that might make someone trip, such as tools or rocks.  Have any leaves, snow, or ice cleared regularly.  Use  sand or salt on walking paths during winter.  Clean up any spills in your garage right away. This includes grease or oil spills. What other actions can I take?  Wear shoes that: ? Have a low heel. Do not wear high heels. ? Have rubber bottoms. ? Are comfortable and fit you well. ? Are closed at the toe. Do not wear open-toe sandals.  Use tools that help you move around (mobility aids) if they are needed. These include: ? Canes. ? Walkers. ? Scooters. ? Crutches.  Review your medicines with your doctor. Some medicines can make you feel dizzy. This can increase your chance of falling. Ask your doctor what other things you can do to help prevent falls. Where to find more information  Centers for Disease Control and Prevention, STEADI: https://garcia.biz/  [employer] on Aging: BrainJudge.co.uk Contact a doctor if:  You are afraid of falling at home.  You feel weak, drowsy, or dizzy at home.  You fall at home. Summary  There are many simple things that you can do to make your home safe and to help prevent falls.  Ways to make your home safe include removing tripping hazards and installing grab bars in the bathroom.  Ask for help when making these changes in your home. This information is not intended to replace advice given to you by your health care provider. Make sure you discuss any questions you have with your health care provider. Document Released: 09/05/2009 Document Revised: 06/24/2017 Document Reviewed: 06/24/2017 Elsevier  Interactive Patient Education  2019 [employer] Maintenance, Female Adopting a healthy lifestyle and getting preventive care can go a long way to promote health and wellness. Talk with your health care provider about what schedule of regular examinations is right for you. This is a good chance for you to check in with your provider about disease prevention and staying healthy. In between checkups, there are plenty of things you can do on your own. Experts have done a lot of research about which lifestyle changes and preventive measures are most likely to keep you healthy. Ask your health care provider for more information. Weight and diet Eat a healthy diet  Be sure to include plenty of vegetables, fruits, low-fat dairy products, and lean protein.  Do not eat a lot of foods high in solid fats, added sugars, or salt.  Get regular exercise. This is one of the most important things you can do for your health. ? Most adults should exercise for at least 150 minutes each week. The exercise should increase your heart rate and make you sweat (moderate-intensity exercise). ? Most adults should also do strengthening exercises at least twice a week. This is in addition to the moderate-intensity exercise. Maintain a healthy weight  Body mass index (BMI) is a measurement that can be used to identify possible weight problems. It estimates body fat based on height and weight. Your health care provider can help determine your BMI and help you achieve or maintain a healthy weight.  For females 46 years of age and older: ? A BMI below 18.5 is considered underweight. ? A BMI of 18.5 to 24.9 is normal. ? A BMI of 25 to 29.9 is considered overweight. ? A BMI of 30 and above is considered obese. Watch levels of cholesterol and blood lipids  You should start having your blood tested for lipids and cholesterol at 83 years of age, then have this test every 5 years.  You may need to have  your  cholesterol levels checked more often if: ? Your lipid or cholesterol levels are high. ? You are older than 83 years of age. ? You are at high risk for heart disease. Cancer screening Lung Cancer  Lung cancer screening is recommended for adults 70-63 years old who are at high risk for lung cancer because of a history of smoking.  A yearly low-dose CT scan of the lungs is recommended for people who: ? Currently smoke. ? Have quit within the past 15 years. ? Have at least a 30-pack-year history of smoking. A pack year is smoking an average of one pack of cigarettes a day for 1 year.  Yearly screening should continue until it has been 15 years since you quit.  Yearly screening should stop if you develop a health problem that would prevent you from having lung cancer treatment. Breast Cancer  Practice breast self-awareness. This means understanding how your breasts normally appear and feel.  It also means doing regular breast self-exams. Let your health care provider know about any changes, no matter how small.  If you are in your 20s or 30s, you should have a clinical breast exam (CBE) by a health care provider every 1-3 years as part of a regular health exam.  If you are 5 or older, have a CBE every year. Also consider having a breast X-ray (mammogram) every year.  If you have a family history of breast cancer, talk to your health care provider about genetic screening.  If you are at high risk for breast cancer, talk to your health care provider about having an MRI and a mammogram every year.  Breast cancer gene (BRCA) assessment is recommended for women who have family members with BRCA-related cancers. BRCA-related cancers include: ? Breast. ? Ovarian. ? Tubal. ? Peritoneal cancers.  Results of the assessment will determine the need for genetic counseling and BRCA1 and BRCA2 testing. Cervical Cancer Your health care provider may recommend that you be screened regularly for  cancer of the pelvic organs (ovaries, uterus, and vagina). This screening involves a pelvic examination, including checking for microscopic changes to the surface of your cervix (Pap test). You may be encouraged to have this screening done every 3 years, beginning at age 33.  For women ages 59-65, health care providers may recommend pelvic exams and Pap testing every 3 years, or they may recommend the Pap and pelvic exam, combined with testing for human papilloma virus (HPV), every 5 years. Some types of HPV increase your risk of cervical cancer. Testing for HPV may also be done on women of any age with unclear Pap test results.  Other health care providers may not recommend any screening for nonpregnant women who are considered low risk for pelvic cancer and who do not have symptoms. Ask your health care provider if a screening pelvic exam is right for you.  If you have had past treatment for cervical cancer or a condition that could lead to cancer, you need Pap tests and screening for cancer for at least 20 years after your treatment. If Pap tests have been discontinued, your risk factors (such as having a new sexual partner) need to be reassessed to determine if screening should resume. Some women have medical problems that increase the chance of getting cervical cancer. In these cases, your health care provider may recommend more frequent screening and Pap tests. Colorectal Cancer  This type of cancer can be detected and often prevented.  Routine colorectal cancer screening usually  begins at 83 years of age and continues through 83 years of age.  Your health care provider may recommend screening at an earlier age if you have risk factors for colon cancer.  Your health care provider may also recommend using home test kits to check for hidden blood in the stool.  A small camera at the end of a tube can be used to examine your colon directly (sigmoidoscopy or colonoscopy). This is done to check for  the earliest forms of colorectal cancer.  Routine screening usually begins at age 34.  Direct examination of the colon should be repeated every 5-10 years through 83 years of age. However, you may need to be screened more often if early forms of precancerous polyps or small growths are found. Skin Cancer  Check your skin from head to toe regularly.  Tell your health care provider about any new moles or changes in moles, especially if there is a change in a mole's shape or color.  Also tell your health care provider if you have a mole that is larger than the size of a pencil eraser.  Always use sunscreen. Apply sunscreen liberally and repeatedly throughout the day.  Protect yourself by wearing long sleeves, pants, a wide-brimmed hat, and sunglasses whenever you are outside. Heart disease, diabetes, and high blood pressure  High blood pressure causes heart disease and increases the risk of stroke. High blood pressure is more likely to develop in: ? People who have blood pressure in the high end of the normal range (130-139/85-89 mm Hg). ? People who are overweight or obese. ? People who are African American.  If you are 79-82 years of age, have your blood pressure checked every 3-5 years. If you are 77 years of age or older, have your blood pressure checked every year. You should have your blood pressure measured twice-once when you are at a hospital or clinic, and once when you are not at a hospital or clinic. Record the average of the two measurements. To check your blood pressure when you are not at a hospital or clinic, you can use: ? An automated blood pressure machine at a pharmacy. ? A home blood pressure monitor.  If you are between 24 years and 34 years old, ask your health care provider if you should take aspirin to prevent strokes.  Have regular diabetes screenings. This involves taking a blood sample to check your fasting blood sugar level. ? If you are at a normal weight and  have a low risk for diabetes, have this test once every three years after 83 years of age. ? If you are overweight and have a high risk for diabetes, consider being tested at a younger age or more often. Preventing infection Hepatitis B  If you have a higher risk for hepatitis B, you should be screened for this virus. You are considered at high risk for hepatitis B if: ? You were born in a country where hepatitis B is common. Ask your health care provider which countries are considered high risk. ? Your parents were born in a high-risk country, and you have not been immunized against hepatitis B (hepatitis B vaccine). ? You have HIV or AIDS. ? You use needles to inject street drugs. ? You live with someone who has hepatitis B. ? You have had sex with someone who has hepatitis B. ? You get hemodialysis treatment. ? You take certain medicines for conditions, including cancer, organ transplantation, and autoimmune conditions. Hepatitis C  Blood testing is recommended for: ? Everyone born from [birth year] through 1963. ? Anyone with known risk factors for hepatitis C. Sexually transmitted infections (STIs)  You should be screened for sexually transmitted infections (STIs) including gonorrhea and chlamydia if: ? You are sexually active and are younger than 83 years of age. ? You are older than 83 years of age and your health care provider tells you that you are at risk for this type of infection. ? Your sexual activity has changed since you were last screened and you are at an increased risk for chlamydia or gonorrhea. Ask your health care provider if you are at risk.  If you do not have HIV, but are at risk, it may be recommended that you take a prescription medicine daily to prevent HIV infection. This is called pre-exposure prophylaxis (PrEP). You are considered at risk if: ? You are sexually active and do not regularly use condoms or know the HIV status of your partner(s). ? You take drugs by  injection. ? You are sexually active with a partner who has HIV. Talk with your health care provider about whether you are at high risk of being infected with HIV. If you choose to begin PrEP, you should first be tested for HIV. You should then be tested every 3 months for as long as you are taking PrEP. Pregnancy  If you are premenopausal and you may become pregnant, ask your health care provider about preconception counseling.  If you may become pregnant, take 400 to 800 micrograms (mcg) of folic acid every day.  If you want to prevent pregnancy, talk to your health care provider about birth control (contraception). Osteoporosis and menopause  Osteoporosis is a disease in which the bones lose minerals and strength with aging. This can result in serious bone fractures. Your risk for osteoporosis can be identified using a bone density scan.  If you are 50 years of age or older, or if you are at risk for osteoporosis and fractures, ask your health care provider if you should be screened.  Ask your health care provider whether you should take a calcium or vitamin D supplement to lower your risk for osteoporosis.  Menopause may have certain physical symptoms and risks.  Hormone replacement therapy may reduce some of these symptoms and risks. Talk to your health care provider about whether hormone replacement therapy is right for you. Follow these instructions at home:  Schedule regular health, dental, and eye exams.  Stay current with your immunizations.  Do not use any tobacco products including cigarettes, chewing tobacco, or electronic cigarettes.  If you are pregnant, do not drink alcohol.  If you are breastfeeding, limit how much and how often you drink alcohol.  Limit alcohol intake to no more than 1 drink per day for nonpregnant women. One drink equals 12 ounces of beer, 5 ounces of wine, or 1 ounces of hard liquor.  Do not use street drugs.  Do not share needles.  Ask  your health care provider for help if you need support or information about quitting drugs.  Tell your health care provider if you often feel depressed.  Tell your health care provider if you have ever been abused or do not feel safe at home. This information is not intended to replace advice given to you by your health care provider. Make sure you discuss any questions you have with your health care provider. Document Released: 05/25/2011 Document Revised: 04/16/2016 Document Reviewed: 08/13/2015 Elsevier Interactive Patient Education  2019 [place].

## 2019-01-12 ENCOUNTER — Telehealth: Payer: Self-pay

## 2019-01-12 NOTE — Telephone Encounter (Signed)
Letter mailed

## 2019-01-24 LAB — HM DIABETES EYE EXAM

## 2019-02-06 ENCOUNTER — Other Ambulatory Visit: Payer: Self-pay | Admitting: Cardiovascular Disease

## 2019-02-07 ENCOUNTER — Ambulatory Visit: Payer: Medicare Other | Admitting: Family Medicine

## 2019-02-13 ENCOUNTER — Encounter: Payer: Self-pay | Admitting: Family Medicine

## 2019-02-16 ENCOUNTER — Telehealth: Payer: Self-pay | Admitting: *Deleted

## 2019-02-16 NOTE — Telephone Encounter (Signed)
I called the pt and rescheduled the appt.

## 2019-02-16 NOTE — Telephone Encounter (Signed)
Copied from [place] [id number]. Topic: General - Other >> Feb 16, 2019  8:41 AM Leward Quan A wrote: Reason for CRM: Patient called to say that she did not mean to cancel her appointment say that she would like the call for her appointment please. Call patient at Ph# [phone]

## 2019-02-20 ENCOUNTER — Ambulatory Visit (INDEPENDENT_AMBULATORY_CARE_PROVIDER_SITE_OTHER): Payer: Medicare Other | Admitting: Family Medicine

## 2019-02-20 ENCOUNTER — Ambulatory Visit: Payer: Medicare Other | Admitting: Family Medicine

## 2019-02-20 DIAGNOSIS — I129 Hypertensive chronic kidney disease with stage 1 through stage 4 chronic kidney disease, or unspecified chronic kidney disease: Secondary | ICD-10-CM

## 2019-02-20 DIAGNOSIS — I10 Essential (primary) hypertension: Secondary | ICD-10-CM

## 2019-02-20 DIAGNOSIS — E1159 Type 2 diabetes mellitus with other circulatory complications: Secondary | ICD-10-CM | POA: Diagnosis not present

## 2019-02-20 DIAGNOSIS — E1122 Type 2 diabetes mellitus with diabetic chronic kidney disease: Secondary | ICD-10-CM

## 2019-02-20 DIAGNOSIS — Z794 Long term (current) use of insulin: Secondary | ICD-10-CM | POA: Diagnosis not present

## 2019-02-20 DIAGNOSIS — N183 Chronic kidney disease, stage 3 unspecified: Secondary | ICD-10-CM

## 2019-02-20 DIAGNOSIS — E785 Hyperlipidemia, unspecified: Secondary | ICD-10-CM

## 2019-02-20 DIAGNOSIS — I152 Hypertension secondary to endocrine disorders: Secondary | ICD-10-CM

## 2019-02-20 DIAGNOSIS — E1169 Type 2 diabetes mellitus with other specified complication: Secondary | ICD-10-CM

## 2019-02-20 NOTE — Progress Notes (Signed)
Virtual Visit via Telephone Note  I connected with Bonnie Chavez on 02/20/19 at  9:00 AM EDT by telephone and verified that I am speaking with the correct person using two identifiers.   I discussed the limitations, risks, security and privacy concerns of performing an evaluation and management service by telephone and the availability of in person appointments. I also discussed with the patient that there may be a patient responsible charge related to this service. The patient expressed understanding and agreed to proceed.  Location patient: home Location provider: work or home office Participants present for the call: patient, provider Patient did not have a visit in the prior 7 days to address this/these issue(s).   History of Present Illness:  Bonnie Chavez is a pleasant 85 y.o. here for follow up. Chronic medical problems summarized below were reviewed for changes and stability and were updated as needed below.    CdCHF/HLD/aortic valve sclerosis/Obesity: -sees cardiologist (Dr. Claiborne Billings) -meds:amlodipine, crestor, lasix, lisinopril, nebivolol, asa -now trying to exercise at home -no cp, sob, doe, swelling  DM, Hyperparathyroidism: -sees endocriologist (Dr. Chalmers Cater) -FBS around 90s, no low blood sugars; postprandials the last week 115, 110, 168, 132, 179, 113 -no low blood sugars -meds: insulin basal 14 units -[facility] exam with [facility] care 02/2016  CKD/vit D def/mild hypercalcemia: -sees nephrology ([name])  HRT for Hot Flashes, Uterine Polyps: -sees gynecologist ([name]) -s/p D and C with her gynecologist  DDD/Lumbar radiculopathy/CTS: -seeing [place] [facility]     Observations/Objective: Patient sounds cheerful and well on the phone. I do not appreciate any SOB. Speech and thought processing are grossly intact. Patient reported vitals: BP 116/68  Assessment and Plan:  1. Type 2 diabetes mellitus with chronic kidney disease, with long-term current use  of insulin, unspecified CKD stage (HCC)  2. Stage 3 chronic kidney disease ([place])  3. Hypertension associated with diabetes ([place])  4. Hyperlipidemia associated with type 2 diabetes mellitus ([place])   -meds reviewed and updated -reviewed BS -discussed diet options despite lockdown for healthy diet for diabetes -she seems to have the diabetes well controlled and is monitoring her home BS, given COVID 19 situation she plans to skip the Hgba1c for now and montitor home BS -follow up 3 months  I did not refer this patient for an OV in the next 24 hours for this/these issue(s).  I discussed the assessment and treatment plan with the patient. The patient was provided an opportunity to ask questions and all were answered. The patient agreed with the plan and demonstrated an understanding of the instructions.   The patient was advised to call back or seek an in-person evaluation if needed.   Follow Up Instructions: -phone call new patient visit with Dr. Ethlyn Gallery in 3 months    I provided 14 minutes of non-face-to-face time during this encounter.   Lucretia Kern, DO

## 2019-04-06 ENCOUNTER — Telehealth: Payer: Self-pay | Admitting: Cardiovascular Disease

## 2019-04-06 NOTE — Telephone Encounter (Signed)
Spoke to pt to inform her that her appt with Dr. Claiborne Billings has been rescheduled to Monday, 6/22 at 10:40 AM. Pt stated that date and time was fine. Pt verbalized thanks for the call.

## 2019-04-13 ENCOUNTER — Telehealth: Payer: Medicare Other | Admitting: Cardiovascular Disease

## 2019-05-10 ENCOUNTER — Telehealth: Payer: Self-pay | Admitting: Cardiovascular Disease

## 2019-05-10 NOTE — Telephone Encounter (Signed)
LVM for pre reg /my chart code exp, & no email

## 2019-05-11 ENCOUNTER — Telehealth: Payer: Self-pay | Admitting: Cardiovascular Disease

## 2019-05-11 NOTE — Telephone Encounter (Signed)
Spoke to pt who was agreeable to change appointment to 05/12/19 for virtual visit with Dr. Claiborne Billings. Will be a telephone visit. Call Home phone number.

## 2019-05-11 NOTE — Telephone Encounter (Signed)
call home phone/ consent/ my chart declined/ pre reg completed °

## 2019-05-12 ENCOUNTER — Telehealth (INDEPENDENT_AMBULATORY_CARE_PROVIDER_SITE_OTHER): Payer: Medicare Other | Admitting: Cardiovascular Disease

## 2019-05-12 VITALS — BP 125/62 | HR 58 | Ht 66.0 in | Wt 193.0 lb

## 2019-05-12 DIAGNOSIS — I350 Nonrheumatic aortic (valve) stenosis: Secondary | ICD-10-CM | POA: Diagnosis not present

## 2019-05-12 DIAGNOSIS — I440 Atrioventricular block, first degree: Secondary | ICD-10-CM

## 2019-05-12 DIAGNOSIS — I10 Essential (primary) hypertension: Secondary | ICD-10-CM | POA: Diagnosis not present

## 2019-05-12 DIAGNOSIS — E1129 Type 2 diabetes mellitus with other diabetic kidney complication: Secondary | ICD-10-CM | POA: Diagnosis not present

## 2019-05-12 DIAGNOSIS — R809 Proteinuria, unspecified: Secondary | ICD-10-CM

## 2019-05-12 DIAGNOSIS — Z794 Long term (current) use of insulin: Secondary | ICD-10-CM

## 2019-05-12 DIAGNOSIS — E6609 Other obesity due to excess calories: Secondary | ICD-10-CM

## 2019-05-12 DIAGNOSIS — E785 Hyperlipidemia, unspecified: Secondary | ICD-10-CM

## 2019-05-12 DIAGNOSIS — Z6833 Body mass index (BMI) 33.0-33.9, adult: Secondary | ICD-10-CM

## 2019-05-12 NOTE — Patient Instructions (Signed)
Medication Instructions:  The current medical regimen is effective;  continue present plan and medications.  If you need a refill on your cardiac medications before your next appointment, please call your pharmacy.   Testing/Procedures: Echocardiogram (December) - Your physician has requested that you have an echocardiogram. Echocardiography is a painless test that uses sound waves to create images of your heart. It provides your doctor with information about the size and shape of your heart and how well your heart's chambers and valves are working. This procedure takes approximately one hour. There are no restrictions for this procedure. This will be performed at our [facility] location - [address], [address].   Follow-Up: At [facility], you and your health needs are our priority.  As part of our continuing mission to provide you with exceptional heart care, we have created designated Provider Care Teams.  These Care Teams include your primary Cardiologist (physician) and Advanced Practice Providers (APPs -  Physician Assistants and Nurse Practitioners) who all work together to provide you with the care you need, when you need it. You will need a follow up appointment in 6 months.  Please call our office 2 months in advance to schedule this appointment.  You may see Dr.Kelly or one of the following Advanced Practice Providers on your designated Care Team: Almyra Deforest, [place] . Fabian Sharp, PA-C

## 2019-05-12 NOTE — Progress Notes (Signed)
Virtual Visit via Telephone Note   This visit type was conducted due to national recommendations for restrictions regarding the COVID-19 Pandemic (e.g. social distancing) in an effort to limit this patient's exposure and mitigate transmission in our community.  Due to her co-morbid illnesses, this patient is at least at moderate risk for complications without adequate follow up.  This format is felt to be most appropriate for this patient at this time.  The patient did not have access to video technology/had technical difficulties with video requiring transitioning to audio format only (telephone).  All issues noted in this document were discussed and addressed.  No physical exam could be performed with this format.  Please refer to the patient's chart for her  consent to telehealth for [facility].   Date:  05/12/2019   ID:  Bonnie Chavez, DOB May 09, 1934, MRN [id number]  Patient Location: Home Provider Location: Home  PCP:  Lucretia Kern, DO  Cardiologist:  Shelva Majestic, MD Electrophysiologist:  None   Evaluation Performed:  Follow-Up Visit  Chief Complaint:  Follow-up of aortic stenosis  History of Present Illness:    Bonnie Chavez is a 85 y.o. female who has a history of hypertension with documented grade 1 diastolic dysfunction, hyperlipidemia, type 2 diabetes mellitus with renal insufficiency, as well as aortic valve sclerosis without stenosis. An echo Doppler study in 2011 showed an EF of 55%.  In 2011 a nuclear perfusion scan showed normal perfusion and function.  She has a history of a goiter for which she sees [name], [name] for her renal insufficiency, [name] for uterine polyps and fibroids.  She has a history of hyperparathyroidism and saw [name]; she has not had surgery in his undergoing continued surveillance of her calcium levels.  She underwent a five-year follow-up echo Doppler study on 08/23/2015.  This continued to show normal systolic function with  mild LVH and grade 1 diastolic dysfunction.  There was a 10 mm mean gradient and 22 mm peak gradient across her moderately calcified aortic valve with a valve area of 1.63 cm.    She has a long-standing history of diabetes mellitus  and sees [name].  She sees [name] for renal insufficiency.  I reviewed her recent blood work from his office 03/09/2017 which showed her creatinine had improved to 1.5.  In March 2018 Cr was 1.63 and in December 2017 Cr was 1.83.    When I saw her in May 2018, she had had subsequent blood work which had revealed improvement in creatinine to 1.5.  I recommended she undergo a 2 year follow-up echo Doppler evaluation of her aortic stenosis.  This was done on 08/10/2007 and showed an EF of 65-70%.  Her mean aortic gradient has increased to 16 and her peak gradient 30.  There was grade 1 diastolic dysfunction, mild MR, mild LA dilation and mild TR.  Her estimated aortic valve area is 1.58 cm.  She denied any chest pain, presyncope or syncope.    I  saw her in October 2018.  Her creatinine in August 2018 was 1.7.  Her blood pressure was stable and I recommended reduction of her furosemide to 20 mg from her prior dose of 40 mg.   She denied chest pain, presyncope or syncope. Recent hemoglobin A1c was 7.6.  She is  followed by [name] of endocrinology and [name] of nephrology. A repeat creatinine in August 2018 was 1.7.    I last saw her in  November 2019 and over the prior year she  denied any chest pain, shortness of breath, presyncope or syncope.  A repeat echo Doppler study on September 02, 2018  continued to show normal systolic function with an EF of 55 to 61%, grade 1 diastolic dysfunction, moderate aortic stenosis with a mean gradient of 23 and peak gradient of 37 and mitral annular calcification.  Laboratory from [name] in October 2019 revealed a creatinine of 1.64.  Since I last saw her she had a telemedicine evaluation with [name], her  primary physician in March 2020 and saw [name] last week and had repeat renal function testing.  She has not yet heard the results.  Since I last saw her, she has remained asymptomatic and specifically denies any chest pain PND orthopnea, presyncope or syncope.  She denies any palpitations.  At times she notes trivial left ankle edema.  She presents for evaluation.  The patient does not have symptoms concerning for COVID-19 infection (fever, chills, cough, or new shortness of breath).    Past Medical History:  Diagnosis Date  . Anemia    History of , resolved  . Aortic valve sclerosis   . Carpal tunnel syndrome   . Chicken pox   . Chronic low back pain 02/27/2016   -DDD -seeing [place] orthopedics   . Diabetes mellitus    type 2, sees Dr. Chalmers Cater  . Goiter    Dr. Chalmers Cater  . Heart murmur   . History of nuclear stress test 06/2010   dipyridamole; normal pattern of perfusion; ekg negative for ischemia; low risk scan   . Hot flashes   . Hyperlipidemia   . Hyperparathyroidism ([place]) 02/27/2016   -sees endocrinologist, Dr. Chalmers Cater, has seen surgeon as well   . Hypertension   . Migraine    no longer has migraines (80's)  . Mitral valve prolapse   . Neuropathy   . Obesity 02/27/2016  . Osteoarthritis    DDD, sees [place] orth[facility]  . PMB (postmenopausal bleeding) 02/28/2009   Resolved  . Pneumonia    history of   . Renal insufficiency    [name]   . Vaginal atrophy   . Vitamin D deficiency    Past Surgical History:  Procedure Laterality Date  . [place] &1976  . CHOLECYSTECTOMY  1991  . COLONOSCOPY    . DILATATION & CURRETTAGE/HYSTEROSCOPY WITH RESECTOCOPE N/A 05/15/2013   Procedure: [place];  Surgeon: Eldred Manges, MD;  Location: [facility];  Service: Gynecology;  Laterality: N/A;  . DILATATION & CURRETTAGE/HYSTEROSCOPY WITH RESECTOCOPE N/A 06/03/2017   Procedure: DILATATION & CURETTAGE/HYSTEROSCOPY;  Surgeon: Eldred Manges, MD;  Location: [facility];  Service: Gynecology;  Laterality: N/A;  . DILATION AND CURETTAGE OF UTERUS  1975  . [place]  . KNEE SURGERY  2000 2011   right knee replacement   . REPLACEMENT TOTAL KNEE  2000   left  . SHOULDER SURGERY  2004 2010   . TRANSTHORACIC ECHOCARDIOGRAM  07/2010   EF=>55%; LA mildly dilated; trace MR/TR;  . TUBAL LIGATION  1976     Current Meds  Medication Sig  . acetaminophen (TYLENOL) 500 MG tablet Take 1 tablet (500 mg total) by mouth every 4 (four) hours as needed for moderate pain.  [name] Kitchen amLODipine (NORVASC) 5 MG tablet Take 1 tablet (5 mg total) by mouth daily.  [name] Kitchen aspirin (ECOTRIN LOW STRENGTH) 81 MG EC tablet Take 81 mg  by mouth at bedtime. Swallow whole.   . cephALEXin (KEFLEX) 500 MG capsule Take 500-1,000 mg by mouth See admin instructions. TAKE ONLY FOR INVASIVE DENTAL PROCEDURES  . furosemide (LASIX) 20 MG tablet Take 40 mg by mouth daily. Can take additional tablet if needed  . gabapentin (NEURONTIN) 100 MG capsule Take 100 mg by mouth at bedtime.  [name] Kitchen HUMULIN N KWIKPEN 100 UNIT/ML Kiwkpen Inject 12 Units into the skin at bedtime.   [name] Kitchen lisinopril (PRINIVIL,ZESTRIL) 10 MG tablet TAKE 1 TABLET BY MOUTH EVERY DAY  . nebivolol (BYSTOLIC) 10 MG tablet Take 5 mg by mouth 2 (two) times daily.   . rosuvastatin (CRESTOR) 40 MG tablet TAKE 1 TABLET BY MOUTH AT BEDTIME     Allergies:   Contrast media [iodinated diagnostic agents], Nsaids, Sensipar [cinacalcet hcl], and Penicillins   Social History   Tobacco Use  . Smoking status: Never Smoker  . Smokeless tobacco: Never Used  Substance Use Topics  . Alcohol use: No    Alcohol/week: 0.0 standard drinks  . Drug use: No     Family Hx: The patient's family history includes Diabetes in her brother and mother; Heart attack in her father and mother; Heart disease in her brother and father; Hyperlipidemia in her mother; Hypertension in her mother; Stroke in her brother; Uterine cancer in her  maternal grandmother.  ROS:   Please see the history of present illness.    No fevers chills night sweats Weight stable No cough or shortness of breath No chest pain PND orthopnea or palpitations No abdominal discomfort Trivial ankle edema No myalgias Diabetes mellitus on insulin Sleeping adequately All other systems reviewed and are negative.   Prior CV studies:   The following studies were reviewed today:  ECHO 09/02/2018 Study Conclusions  - Left ventricle: The cavity size was normal. Wall thickness was   increased in a pattern of mild LVH. There was mild focal basal   hypertrophy of the septum. Systolic function was normal. The   estimated ejection fraction was in the range of 55% to 60%. Wall   motion was normal; there were no regional wall motion   abnormalities. Doppler parameters are consistent with abnormal   left ventricular relaxation (grade 1 diastolic dysfunction). - Aortic valve: Valve mobility was restricted. There was moderate   stenosis. - Mitral valve: Calcified annulus. Mildly thickened leaflets . - Atrial septum: There was an atrial septal aneurysm. - Pericardium, extracardiac: A trivial pericardial effusion was   identified.  Impressions:  - Normal LV systolic function; mild LVH; mild diastolic   dysfunction; calcified aortic valve with moderate AS (mean   gradient 23 mmHg).  Labs/Other Tests and Data Reviewed:    EKG:  An ECG dated 10/13/2018 was personally reviewed today and demonstrated:  Normal sinus rhythm at 61 bpm.  First-degree AV block.  LVH by voltage criteria.  Recent Labs: No results found for requested labs within last 8760 hours.   Recent Lipid Panel Lab Results  Component Value Date/Time   CHOL 168 11/04/2017 08:19 AM   TRIG 90.0 11/04/2017 08:19 AM   HDL 55.50 11/04/2017 08:19 AM   CHOLHDL 3 11/04/2017 08:19 AM   LDLCALC 95 11/04/2017 08:19 AM    Wt Readings from Last 3 Encounters:  05/12/19 193 lb (87.5 kg)  01/05/19  196 lb (88.9 kg)  11/08/18 192 lb 9.6 oz (87.4 kg)     Objective:    Vital Signs:  BP 125/62   Pulse (!) 58  Ht 5\' 6"  (1.676 m)   Wt 193 lb (87.5 kg)   BMI 31.15 kg/m    Since this was a phone visit I could not visually see the patient. She states her physical appearance is similar to her last evaluation with me in November 2019 Breathing is normal and not labored There is no audible wheezing Her heart rhythm by her palpation was stable with no extra beats She did not have chest wall tenderness to palpation She did not have abdominal tenderness to palpation She admits to trace left ankle swelling She has a history of peripheral neuropathy which is unchanged She is sleeping well Normal cognition mood and affect  ASSESSMENT & PLAN:    1. Aortic valve stenosis: Currently asymptomatic.  Her last echo Doppler study in October 2018 suggested moderate aortic stenosis with a mean gradient of 23 and aortic valve area of approximately 1.6.  We will plan to repeat echo Doppler study in November 2020 for 1 year reassessment of her aortic valve stenosis. 2. Essential hypertension: Blood pressure today is excellent.  She also tells me when she saw Dr. diverting last week blood pressure in the supine position was 126/68 and in the standing position 124/72.  She will continue with her current dose of lisinopril 10 mg, Bystolic 5 mg, and furosemide 40 mg daily. 3. Hyperlipidemia with target LDL less than 70: She is diabetic.  She has not had recent lipid studies.  LDL in 2019 was 95.  She will be seeing Dr. Julianne Rice replacement, Dr. Volanda Napoleon on June 29.  I will asked that fasting laboratory be obtained at that time for reassessment of her lab studies. 4. Insulin-dependent diabetes mellitus: She has chronic kidney disease as well as neuropathy.  Currently on insulin; followed by [name] of endocrinology 5. Peripheral neuropathy on gabapentin. 6. First-degree AV block: Most recent ECG showed PR interval  of 260 ms.  Patient is asymptomatic  COVID-19 Education: The signs and symptoms of COVID-19 were discussed with the patient and how to seek care for testing (follow up with PCP or arrange E-visit).  The importance of social distancing was discussed today.  Time:   Today, I have spent 21 minutes with the patient with telehealth technology discussing the above problems.     Medication Adjustments/Labs and Tests Ordered: Current medicines are reviewed at length with the patient today.  Concerns regarding medicines are outlined above.   Tests Ordered: No orders of the defined types were placed in this encounter.   Medication Changes: No orders of the defined types were placed in this encounter.   Follow Up: Plan for 1 year follow-up echo Doppler study in November 2020 with follow-up in person office visit December 2020   Signed, Shelva Majestic, MD  05/12/2019 8:17 AM    [place]

## 2019-05-15 ENCOUNTER — Telehealth: Payer: Medicare Other | Admitting: Cardiovascular Disease

## 2019-05-22 ENCOUNTER — Ambulatory Visit (INDEPENDENT_AMBULATORY_CARE_PROVIDER_SITE_OTHER): Payer: Medicare Other | Admitting: Family Medicine

## 2019-05-22 ENCOUNTER — Encounter: Payer: Self-pay | Admitting: Family Medicine

## 2019-05-22 ENCOUNTER — Other Ambulatory Visit: Payer: Self-pay

## 2019-05-22 DIAGNOSIS — E785 Hyperlipidemia, unspecified: Secondary | ICD-10-CM

## 2019-05-22 DIAGNOSIS — I10 Essential (primary) hypertension: Secondary | ICD-10-CM | POA: Diagnosis not present

## 2019-05-22 DIAGNOSIS — E1122 Type 2 diabetes mellitus with diabetic chronic kidney disease: Secondary | ICD-10-CM

## 2019-05-22 DIAGNOSIS — M150 Primary generalized (osteo)arthritis: Secondary | ICD-10-CM

## 2019-05-22 DIAGNOSIS — E213 Hyperparathyroidism, unspecified: Secondary | ICD-10-CM

## 2019-05-22 DIAGNOSIS — M159 Polyosteoarthritis, unspecified: Secondary | ICD-10-CM

## 2019-05-22 DIAGNOSIS — Z794 Long term (current) use of insulin: Secondary | ICD-10-CM

## 2019-05-22 NOTE — Progress Notes (Signed)
Virtual Visit via Telephone Note  I connected with Bonnie Chavez on 05/22/19 at  8:00 AM EDT by telephone and verified that I am speaking with the correct person using two identifiers.   I discussed the limitations, risks, security and privacy concerns of performing an evaluation and management service by telephone and the availability of in person appointments. I also discussed with the patient that there may be a patient responsible charge related to this service. The patient expressed understanding and agreed to proceed.  Location patient: home Location provider: work or home office Participants present for the call: patient, provider Patient did not have a visit in the prior 7 days to address this/these issue(s).   History of Present Illness: Pt seen for f/u and TOC, previously seen by Dr. Maudie Mercury  Diabetes type II: -Dr. Debbora Presto -taking humalin 12 units at nights. -fsbs 121 -denies hypoglycemia -had oatmeal and [place] sausage this am -[facility]  [facility] given the [facility]  OA: -b/l shoulders and knee replacement  HTN: -BP 124/59 P 57 -taking bystolic 5 mg BID, norvasc 5 mg daily, and lisinopril 10 mg -followed by Nephro and Cardiology, Dr. Claiborne Billings  HLD: -taking crestor 40 mg -denies myalgias.  CKD: -followed by Nephro, [name]  Secondary Hyperparathyroidism: -followed by Dr. Debbora Presto, Endocrinology -states calcium was 11.1 recently.   -states was told she needs to go back on sensipar, but does not like the s/e such as flatus.  Social Hx:  Pt is the proud Grandmother of newborn twins (a boy and a girl).  The babies are [place] and weighed a little ober 6 lbs each.   Observations/Objective: Patient sounds cheerful and well on the phone. I do not appreciate any SOB. Speech and thought processing are grossly intact. Patient reported vitals:  Weight 191 lbs, BP 124/59  Pulse 57  FSBS 121  Assessment and  Plan: Hyperparathyroidism ([place])  -Recent calcium elevated per pt -continue f/u with Endocrinology, Dr. Debbora Presto -pt considering restarting Sensipar  Type 2 diabetes mellitus with chronic kidney disease, with long-term current use of insulin, unspecified CKD stage ([place])  -continue lifestyle modifications -continue humalin 12 units qhs -avoid nephrotoxic meds -continue f/u with Endocrinology, Dr. Debbora Presto and Nephrology, [name]  Primary osteoarthritis involving multiple joints  -stable -continue f/u with Ortho -Tylenol prn  HLD: -continue Crestor 40 mg daily -continue lifestyle modifications  Essential hypertension  -controlled -continue norvasc 5 mg, lisinopril 10 mg, and bystolic 5 mg BID. -continue lifestyle modifications -continue f/u with Cardiology, Dr. Claiborne Billings.   Follow Up Instructions: F/u in  3 months, sooner if needed.  I did not refer this patient for an OV in the next 24 hours for this/these issue(s).  I discussed the assessment and treatment plan with the patient. The patient was provided an opportunity to ask questions and all were answered. The patient agreed with the plan and demonstrated an understanding of the instructions.   The patient was advised to call back or seek an in-person evaluation if the symptoms worsen or if the condition fails to improve as anticipated.  I provided 15 minutes of non-face-to-face time during this encounter.   Billie Ruddy, MD

## 2019-05-24 LAB — HM MAMMOGRAPHY: HM Mammogram: NORMAL (ref 0–4)

## 2019-06-17 ENCOUNTER — Other Ambulatory Visit: Payer: Self-pay | Admitting: Cardiovascular Disease

## 2019-06-22 ENCOUNTER — Other Ambulatory Visit: Payer: Self-pay | Admitting: Cardiovascular Disease

## 2019-08-23 ENCOUNTER — Other Ambulatory Visit: Payer: Self-pay

## 2019-08-23 ENCOUNTER — Ambulatory Visit: Payer: Medicare Other | Admitting: Family Medicine

## 2019-08-23 ENCOUNTER — Encounter: Payer: Self-pay | Admitting: Family Medicine

## 2019-08-23 VITALS — BP 130/60 | HR 68 | Temp 97.9°F | Wt 197.0 lb

## 2019-08-23 DIAGNOSIS — M150 Primary generalized (osteo)arthritis: Secondary | ICD-10-CM

## 2019-08-23 DIAGNOSIS — E1122 Type 2 diabetes mellitus with diabetic chronic kidney disease: Secondary | ICD-10-CM | POA: Diagnosis not present

## 2019-08-23 DIAGNOSIS — M7631 Iliotibial band syndrome, right leg: Secondary | ICD-10-CM

## 2019-08-23 DIAGNOSIS — Z794 Long term (current) use of insulin: Secondary | ICD-10-CM

## 2019-08-23 DIAGNOSIS — M8589 Other specified disorders of bone density and structure, multiple sites: Secondary | ICD-10-CM

## 2019-08-23 DIAGNOSIS — I10 Essential (primary) hypertension: Secondary | ICD-10-CM

## 2019-08-23 DIAGNOSIS — M159 Polyosteoarthritis, unspecified: Secondary | ICD-10-CM

## 2019-08-23 DIAGNOSIS — R011 Cardiac murmur, unspecified: Secondary | ICD-10-CM

## 2019-08-23 NOTE — Progress Notes (Signed)
Subjective:    Patient ID: Bonnie Chavez, female    DOB: 29-Nov-1933, 85 y.o.   MRN: [id number]  No chief complaint on file.   HPI Patient was seen today for f/u.  Overall doing well.  Has been enjoying seeing videos of her now 3 mo twin granddaughter and grandson who are in [place].  Diabetes type 2: -Checking FS BS at home typically 90s to mid 100s. -Last A1c 8.5% on 07/22/2019 -Has an appointment with endocrinology tomorrow -Taking Humulin -Patient states she started on Synthroid for decreased energy.  Osteopenia: -Noted on bone density scan 06/07/2019  HTN: -Checking BP at home.  H/58, 116/60, 113/59, 110/1, 109/55, 127/62 -Denies headaches, dizziness, chest pain, changes in vision  CKD: -Followed by [facility] nephrology, [name] -Patient states she will soon be seeing a new provider as [name] is reducing his schedule -Patient was taken off all supplements 2/2 hypercalcemia -Vitamin D 20.9 on 07/14/2019  Osteoarthritis: -Patient notes deformity of hands -Followed by [facility]  Influenza vaccine 08/08/19  Past Medical History:  Diagnosis Date  . Anemia    History of , resolved  . Aortic valve sclerosis   . Carpal tunnel syndrome   . Chicken pox   . Chronic low back pain 02/27/2016   -DDD -seeing [place] [facility]   . Diabetes mellitus    type 2, sees Dr. Chalmers Cater  . Goiter    Dr. Chalmers Cater  . Heart murmur   . History of nuclear stress test 06/2010   dipyridamole; normal pattern of perfusion; ekg negative for ischemia; low risk scan   . Hot flashes   . Hyperlipidemia   . Hyperparathyroidism ([place]) 02/27/2016   -sees endocrinologist, Dr. Chalmers Cater, has seen surgeon as well   . Hypertension   . Migraine    no longer has migraines (80's)  . Mitral valve prolapse   . Neuropathy   . Obesity 02/27/2016  . Osteoarthritis    DDD, sees [place] ortho  . PMB (postmenopausal bleeding) 02/28/2009   Resolved  . Pneumonia    history of   . Renal insufficiency    Dr.  Jimmy Footman   . Vaginal atrophy   . Vitamin D deficiency     Allergies  Allergen Reactions  . Contrast Media [Iodinated Diagnostic Agents] Other (See Comments)    Reaction unknown  . Nsaids Other (See Comments)    Abnormal renal function  . Sensipar [Cinacalcet Hcl]     Pt stated, "Made me feel bloated, gassy and fluid buildup"  . Penicillins Rash and Other (See Comments)    Pt has taken Keflex without difficulty Has patient had a PCN reaction causing immediate rash, facial/tongue/throat swelling, SOB or lightheadedness with hypotension: yes Has patient had a PCN reaction causing severe rash involving mucus membranes or skin necrosis: no Has patient had a PCN reaction that required hospitalization no Has patient had a PCN reaction occurring within the last 10 years: no If all of the above answers are "NO", then may proceed with Cephalosporin use.    ROS General: Denies fever, chills, night sweats, changes in weight, changes in appetite HEENT: Denies headaches, ear pain, changes in vision, rhinorrhea, sore throat CV: Denies CP, palpitations, SOB, orthopnea Pulm: Denies SOB, cough, wheezing GI: Denies abdominal pain, nausea, vomiting, diarrhea, constipation GU: Denies dysuria, hematuria, frequency, vaginal discharge Msk: Denies muscle cramps, joint pains +R thigh pain Neuro: Denies weakness, numbness, tingling Skin: Denies rashes, bruising Psych: Denies depression, anxiety, hallucinations      Objective:  Blood pressure 130/60, pulse 68, temperature 97.9 F (36.6 C), temperature source Temporal, weight 197 lb (89.4 kg), SpO2 99 %.  Gen. Pleasant, well-nourished, in no distress, normal affect  HEENT: [place]/AT, face symmetric, conjunctiva clear, no scleral icterus, PERRLA, EOMI, nares patent without drainage Lungs: no accessory muscle use, CTAB, no wheezes or rales Cardiovascular: RRR, 3/6 murmur best hear in R upper sternal area, no peripheral edema Abdomen: BS present, soft,  NT/ND Musculoskeletal: TTP of R lateral thigh.  No TTP of groin, hip, or knee.  No edema, erythema, or deformity.  No deformities, no cyanosis or clubbing, normal tone Neuro:  A&Ox3, CN II-XII intact, normal gait Skin:  Warm, no lesions/ rash   Wt Readings from Last 3 Encounters:  08/23/19 197 lb (89.4 kg)  05/12/19 193 lb (87.5 kg)  01/05/19 196 lb (88.9 kg)    Lab Results  Component Value Date   WBC 7.3 11/04/2017   HGB 12.3 11/04/2017   HCT 36.8 11/04/2017   PLT 207.0 11/04/2017   GLUCOSE 138 (H) 11/04/2017   CHOL 168 11/04/2017   TRIG 90.0 11/04/2017   HDL 55.50 11/04/2017   LDLCALC 95 11/04/2017   ALT 15 05/24/2014   AST 25 05/24/2014   NA 142 01/11/2018   K 4.8 01/11/2018   CL 103 11/04/2017   CREATININE 1.7 (A) 01/11/2018   BUN 43 (A) 01/11/2018   CO2 30 11/04/2017   TSH 2.00 01/30/2016   INR 1.62 (H) 10/30/2010   HGBA1C 7.3 (A) 06/09/2018   MICROALBUR 0.9 05/24/2014    Assessment/Plan:  Essential hypertension -controlled -continue current meds -last creat 1.41 on 07/14/19 -continue checking bp at home and f/u with Nephrology  Type 2 diabetes mellitus with chronic kidney disease, with long-term current use of insulin, unspecified CKD stage ([place]) -controlled -continue current meds humalin 12 units -Hgb A1c 8.5% on 07/14/19 -continue f/u with Endo, Dr. Chalmers Cater  Osteopenia of multiple sites -continue water aerobics[facility] -unable to take supplements at this time 2/2 renal function -continue to monitor -f/u with Endo and Nephro  Primary osteoarthritis involving multiple joints -continue blue emu -consider heat, massage, biofreeze -continue f/u with Dr. Wynelle Link, Ortho  Iliotibial band syndrome of right side -discussed supportive care -given handout -f/u prn  Heart murmur -stable -ECHO 09/02/18 with mild LVH, moderate AS -continue to monitor -consider referral to Cards  F/u prn for R thigh.  4 months for chronic conditions  Grier Mitts, MD

## 2019-08-23 NOTE — Patient Instructions (Addendum)
You can continue to use blue emu on your right leg.  You can also use heat, ice, massage, Biofreeze.  Biofreeze can be found on the shelf at the drugstore.  You can also do gentle stretching to help relieve the discomfort. Iliotibial Band Syndrome  Iliotibial band syndrome (ITBS) is a condition that often causes knee pain. It can also cause pain in the outside of your hip, thigh, and knee. The iliotibial band is a strip of tissue that runs from the outside of your hip and down your thigh to the outside of your knee. Repeatedly bending and straightening your knee can irritate the iliotibial band. What are the causes? This condition is caused by inflammation and irritation from the friction of the iliotibial band moving over the thigh bone (femur) when you repeatedly bend and straighten your knee. What increases the risk? This condition is more likely to develop in people who:  Frequently change elevation during their workouts.  Run very long distances.  Recently increased the length or intensity of their workouts.  Run downhill often, or just started running downhill.  Ride a bike very far or often. You may also be at greater risk if you start a new workout routine without first warming up or if you have a job that requires you to bend, squat, or climb frequently. What are the signs or symptoms? Symptoms of this condition include:  Pain along the outside of your knee that may be worse with activity, especially running or going up and down stairs.  A "snapping" sensation over your knee.  Swelling on the outside of your knee.  Pain or a feeling of tightness in your hip. How is this diagnosed? This condition is diagnosed based on your symptoms, medical history, and physical exam. You may also see a health care provider who specializes in reducing pain and increasing mobility (physical therapist). A physical therapist may do an exam to check your balance, movement, and way of walking or  running (gait) to see whether the way you move could contribute to your injury. You may also have tests to measure your strength, flexibility, and range of motion. How is this treated? Treatment for this condition includes:  Resting and limiting exercise.  Returning to activities gradually.  Doing range-of-motion and strengthening exercises (physical therapy) as told by your health care provider.  Including low-impact activities, such as swimming, in your exercise routine. Follow these instructions at home:  If directed, apply ice to the injured area. ? Put ice in a plastic bag. ? Place a towel between your skin and the bag. ? Leave the ice on for 20 minutes, 2-3 times per day.  Return to your normal activities as told by your health care provider. Ask your health care provider what activities are safe for you.  Keep all follow-up visits with your health care provider. This is important. Contact a health care provider if:  Your pain does not improve or gets worse despite treatment. This information is not intended to replace advice given to you by your health care provider. Make sure you discuss any questions you have with your health care provider. Document Released: 05/01/2002 Document Revised: 10/22/2017 Document Reviewed: 12/11/2016 Elsevier Patient Education  [place].  Osteopenia  Osteopenia is a loss of thickness (density) inside of the bones. Another name for osteopenia is low bone mass. Mild osteopenia is a normal part of aging. It is not a disease, and it does not cause symptoms. However, if you have  osteopenia and continue to lose bone mass, you could develop a condition that causes the bones to become thin and break more easily (osteoporosis). You may also lose some height, have back pain, and have a stooped posture. Although osteopenia is not a disease, making changes to your lifestyle and diet can help to prevent osteopenia from developing into  osteoporosis. What are the causes? Osteopenia is caused by loss of calcium in the bones.  Bones are constantly changing. Old bone cells are continually being replaced with new bone cells. This process builds new bone. The mineral calcium is needed to build new bone and maintain bone density. Bone density is usually highest around age 45. After that, most people's bodies cannot replace all the bone they have lost with new bone. What increases the risk? You are more likely to develop this condition if:  You are older than age 90.  You are a woman who went through menopause early.  You have a long illness that keeps you in bed.  You do not get enough exercise.  You lack certain nutrients (malnutrition).  You have an overactive thyroid gland (hyperthyroidism).  You smoke.  You drink a lot of alcohol.  You are taking medicines that weaken the bones, such as steroids. What are the signs or symptoms? This condition does not cause any symptoms. You may have a slightly higher risk for bone breaks (fractures), so getting fractures more easily than normal may be an indication of osteopenia. How is this diagnosed? Your health care provider can diagnose this condition with a special type of X-ray exam that measures bone density (dual-energy X-ray absorptiometry, DEXA). This test can measure bone density in your hips, spine, and wrists. Osteopenia has no symptoms, so this condition is usually diagnosed after a routine bone density screening test is done for osteoporosis. This routine screening is usually done for:  Women who are age 49 or older.  Men who are age 68 or older. If you have risk factors for osteopenia, you may have the screening test at an earlier age. How is this treated? Making dietary and lifestyle changes can lower your risk for osteoporosis. If you have severe osteopenia that is close to becoming osteoporosis, your health care provider may prescribe medicines and dietary  supplements such as calcium and vitamin D. These supplements help to rebuild bone density. Follow these instructions at home:   Take over-the-counter and prescription medicines only as told by your health care provider. These include vitamins and supplements.  Eat a diet that is high in calcium and vitamin D. ? Calcium is found in dairy products, beans, salmon, and leafy green vegetables like spinach and broccoli. ? Look for foods that have vitamin D and calcium added to them (fortified foods), such as orange juice, cereal, and bread.  Do 30 or more minutes of a weight-bearing exercise every day, such as walking, jogging, or playing a sport. These types of exercises strengthen the bones.  Take precautions at home to lower your risk of falling, such as: ? Keeping rooms well-lit and free of clutter, such as cords. ? Installing safety rails on stairs. ? Using rubber mats in the bathroom or other areas that are often wet or slippery.  Do not use any products that contain nicotine or tobacco, such as cigarettes and e-cigarettes. If you need help quitting, ask your health care provider.  Avoid alcohol or limit alcohol intake to no more than 1 drink a day for nonpregnant women and 2 drinks  a day for men. One drink equals 12 oz of beer, 5 oz of wine, or 1 oz of hard liquor.  Keep all follow-up visits as told by your health care provider. This is important. Contact a health care provider if:  You have not had a bone density screening for osteoporosis and you are: ? A woman, age 33 or older. ? A man, age 32 or older.  You are a postmenopausal woman who has not had a bone density screening for osteoporosis.  You are older than age 91 and you want to know if you should have bone density screening for osteoporosis. Summary  Osteopenia is a loss of thickness (density) inside of the bones. Another name for osteopenia is low bone mass.  Osteopenia is not a disease, but it may increase your risk  for a condition that causes the bones to become thin and break more easily (osteoporosis).  You may be at risk for osteopenia if you are older than age 56 or if you are a woman who went through early menopause.  Osteopenia does not cause any symptoms, but it can be diagnosed with a bone density screening test.  Dietary and lifestyle changes are the first treatment for osteopenia. These may lower your risk for osteoporosis. This information is not intended to replace advice given to you by your health care provider. Make sure you discuss any questions you have with your health care provider. Document Released: 08/18/2017 Document Revised: 10/22/2017 Document Reviewed: 08/18/2017 Elsevier Patient Education  2020 [place].  Preventing Diabetes Mellitus Complications You can take action to prevent or slow down problems that are caused by diabetes (diabetes mellitus). Following your diabetes plan and taking care of yourself can reduce your risk of serious or life-threatening complications. What actions can I take to prevent diabetes complications? Manage your diabetes   Follow instructions from your health care providers about managing your diabetes. Your diabetes may be managed by a team of health care providers who can teach you how to care for yourself and can answer questions that you have.  Educate yourself about your condition so you can make healthy choices about eating and physical activity.  Check your blood sugar (glucose) levels as often as directed. Your health care provider will help you decide how often to check your blood glucose level depending on your treatment goals and how well you are meeting them.  Ask your health care provider if you should take low-dose aspirin daily and what dose is recommended for you. Taking low-dose aspirin daily is recommended to help prevent cardiovascular disease. Do not use nicotine or tobacco Do not use any products that contain nicotine or  tobacco, such as cigarettes and e-cigarettes. If you need help quitting, ask your health care provider. Nicotine raises your risk for diabetes problems. If you quit using nicotine:  You will lower your risk for heart attack, stroke, nerve disease, and kidney disease.  Your cholesterol and blood pressure may improve.  Your blood circulation will improve. Keep your blood pressure under control Your personal target blood pressure is determined based on:  Your age.  Your medicines.  How long you have had diabetes.  Any other medical conditions you have. To control your blood pressure:  Follow instructions from your health care provider about meal planning, exercise, and medicines.  Make sure your health care provider checks your blood pressure at every medical visit.  Monitor your blood pressure at home as told by your health care provider.  Keep your cholesterol under control To control your cholesterol:  Follow instructions from your health care provider about meal planning, exercise, and medicines.  Have your cholesterol checked at least once a year.  You may be prescribed medicine to lower cholesterol (statin). If you are not taking a statin, ask your health care provider if you should be. Controlling your cholesterol may:  Help prevent heart disease and stroke. These are the most common health problems for people with diabetes.  Improve your blood flow. Schedule and keep yearly physical exams and eye exams Your health care provider will tell you how often you need medical visits depending on your diabetes management plan. Keep all follow-up visits as directed. This is important so possible problems can be identified early and complications can be avoided or treated.  Every visit with your health care provider should include measuring your: ? Weight. ? Blood pressure. ? Blood glucose control.  Your A1c (hemoglobin A1c) level should be checked: ? At least 2 times a year,  if you are meeting your treatment goals. ? 4 times a year, if you are not meeting treatment goals or if your treatment goals have changed.  Your blood lipids (lipid profile) should be checked yearly. You should also be checked yearly for protein in your urine (urine microalbumin).  If you have type 1 diabetes, get an eye exam 3-5 years after you are diagnosed, and then once a year after your first exam.  If you have type 2 diabetes, get an eye exam as soon as you are diagnosed, and then once a year after your first exam. Keep your vaccines current It is recommended that you receive:  A flu (influenza) vaccine every year.  A pneumonia (pneumococcal) vaccine and a hepatitis B vaccine. If you are age 36 or older, you may get the pneumonia vaccine as a series of two separate shots. Ask your health care provider which other vaccines may be recommended. Take care of your feet Diabetes may cause you to have poor blood circulation to your legs and feet. Because of this, taking care of your feet is very important. Diabetes can cause:  The skin on the feet to get thinner, break more easily, and heal more slowly.  Nerve damage in your legs and feet, which results in decreased feeling. You may not notice minor injuries that could lead to serious problems. To avoid foot problems:  Check your skin and feet every day for cuts, bruises, redness, blisters, or sores.  Schedule a foot exam with your health care provider once every year. This exam includes: ? Inspecting of the structure and skin of your feet. ? Checking the pulses and sensation in your feet.  Make sure that your health care provider performs a visual foot exam at every medical visit.  Take care of your teeth People with poorly controlled diabetes are more likely to have gum (periodontal) disease. Diabetes can make periodontal diseases harder to control. If not treated, periodontal diseases can lead to tooth loss. To prevent  this:  Brush your teeth twice a day.  Floss at least once a day.  Visit your dentist 2 times a year. Drink responsibly Limit alcohol intake to no more than 1 drink a day for nonpregnant women and 2 drinks a day for men. One drink equals 12 oz of beer, 5 oz of wine, or 1 oz of hard liquor.  It is important to eat food when you drink alcohol to avoid low blood glucose (hypoglycemia). Avoid  alcohol if you:  Have a history of alcohol abuse or dependence.  Are pregnant.  Have liver disease, pancreatitis, advanced neuropathy, or severe hypertriglyceridemia. Lessen stress Living with diabetes can be stressful. When you are experiencing stress, your blood glucose may be affected in two ways:  Stress hormones may cause your blood glucose to rise.  You may be distracted from taking good care of yourself. Be aware of your stress level and make changes to help you manage challenging situations. To lower your stress levels:  Consider joining a support group.  Do planned relaxation or meditation.  Do a hobby that you enjoy.  Maintain healthy relationships.  Exercise regularly.  Work with your health care provider or a mental health professional. Summary  You can take action to prevent or slow down problems that are caused by diabetes (diabetes mellitus). Following your diabetes plan and taking care of yourself can reduce your risk of serious or life-threatening complications.  Follow instructions from your health care providers about managing your diabetes. Your diabetes may be managed by a team of health care providers who can teach you how to care for yourself and can answer questions that you have.  Your health care provider will tell you how often you need medical visits depending on your diabetes management plan. Keep all follow-up visits as directed. This is important so possible problems can be identified early and complications can be avoided or treated. This information is not  intended to replace advice given to you by your health care provider. Make sure you discuss any questions you have with your health care provider. Document Released: 07/28/2011 Document Revised: 02/07/2018 Document Reviewed: 08/08/2016 Elsevier Patient Education  2020 [employer].

## 2019-11-06 ENCOUNTER — Ambulatory Visit (HOSPITAL_COMMUNITY): Payer: Medicare Other | Attending: Cardiovascular Disease

## 2019-11-06 ENCOUNTER — Other Ambulatory Visit: Payer: Self-pay | Admitting: Cardiovascular Disease

## 2019-11-06 ENCOUNTER — Other Ambulatory Visit: Payer: Self-pay

## 2019-11-06 DIAGNOSIS — I10 Essential (primary) hypertension: Secondary | ICD-10-CM | POA: Diagnosis not present

## 2019-11-07 NOTE — Telephone Encounter (Signed)
Rx has been sent to the pharmacy electronically. ° °

## 2019-11-14 ENCOUNTER — Telehealth: Payer: Self-pay | Admitting: Cardiovascular Disease

## 2019-11-14 NOTE — Telephone Encounter (Signed)
Patient called w/echo results. Reminded of OV Nov 29, 2019. She would like copy of echo at this visit.

## 2019-11-14 NOTE — Telephone Encounter (Signed)
New message  Patient returning call for echo results. Please give patient a call back.

## 2019-11-29 ENCOUNTER — Other Ambulatory Visit: Payer: Self-pay

## 2019-11-29 ENCOUNTER — Encounter: Payer: Self-pay | Admitting: Cardiovascular Disease

## 2019-11-29 ENCOUNTER — Ambulatory Visit: Payer: Medicare Other | Admitting: Cardiovascular Disease

## 2019-11-29 VITALS — BP 130/72 | HR 61 | Ht 66.0 in | Wt 195.0 lb

## 2019-11-29 DIAGNOSIS — R809 Proteinuria, unspecified: Secondary | ICD-10-CM

## 2019-11-29 DIAGNOSIS — E1129 Type 2 diabetes mellitus with other diabetic kidney complication: Secondary | ICD-10-CM | POA: Diagnosis not present

## 2019-11-29 DIAGNOSIS — I440 Atrioventricular block, first degree: Secondary | ICD-10-CM

## 2019-11-29 DIAGNOSIS — I350 Nonrheumatic aortic (valve) stenosis: Secondary | ICD-10-CM | POA: Diagnosis not present

## 2019-11-29 DIAGNOSIS — Z6833 Body mass index (BMI) 33.0-33.9, adult: Secondary | ICD-10-CM

## 2019-11-29 DIAGNOSIS — Z794 Long term (current) use of insulin: Secondary | ICD-10-CM

## 2019-11-29 DIAGNOSIS — E6609 Other obesity due to excess calories: Secondary | ICD-10-CM

## 2019-11-29 DIAGNOSIS — I10 Essential (primary) hypertension: Secondary | ICD-10-CM | POA: Diagnosis not present

## 2019-11-29 DIAGNOSIS — E785 Hyperlipidemia, unspecified: Secondary | ICD-10-CM

## 2019-11-29 DIAGNOSIS — E213 Hyperparathyroidism, unspecified: Secondary | ICD-10-CM

## 2019-11-29 NOTE — Progress Notes (Signed)
Patient ID: Bonnie Chavez, female   DOB: 05-Feb-1934, 86 y.o.   MRN: [id number]      HPI: Bonnie Chavez is a 86 y.o. female presents to the office today for a 80 month cardiology evaluation.  Bonnie Chavez has a history of hypertension with documented grade 1 diastolic dysfunction, hyperlipidemia, type 2 diabetes mellitus with renal insufficiency, as well as aortic valve sclerosis without stenosis. An echo Doppler study in 2011 showed an EF of 55%.  In 2011 a nuclear perfusion scan showed normal perfusion and function.  She has a history of a goiter for which she sees [name], [name] for her renal insufficiency, [name] for uterine polyps and fibroids.  She has a history of hyperparathyroidism and saw [name]; she has not had surgery in his undergoing continued surveillance of her calcium levels.  She underwent a five-year follow-up echo Doppler study on 08/23/2015.  This continued to show normal systolic function with mild LVH and grade 1 diastolic dysfunction.  There was a 10 mm mean gradient and 22 mm peak gradient across her moderately calcified aortic valve with a valve area of 1.63 cm.    She has a long-standing history of diabetes mellitus  and sees [name].  She sees [name] for renal insufficiency.  I reviewed her recent blood work from his office 03/09/2017 which showed her creatinine had improved to 1.5.  In March 2018 Cr was 1.63 and in December 2017 Cr was 1.83.    When I saw her in May 2018, she had had subsequent blood work which had revealed improvement in creatinine to 1.5.  I recommended she undergo a 2 year follow-up echo Doppler evaluation of her aortic stenosis.  This was done on 08/10/2007 and showed an EF of 65-70%.  Her mean aortic gradient has increased to 16 and her peak gradient 30.  There was grade 1 diastolic dysfunction, mild MR, mild LA dilation and mild TR.  Her estimated aortic valve area is 1.58 cm.  She denied any chest pain, presyncope or  syncope.    I saw her in October 2018.  She has continued to be followed by [name] of endocrinology and [name] of nephrology.  She has renal insufficiency and her creatinine in August 2018 was 1.7.  Her blood pressure was stable and I recommended reduction of her furosemide to 20 mg from her prior dose of 40 mg.  Any chest pain.  She denies presyncope or syncope.  She tells me her recent hemoglobin A1c was 7.6.  She is  followed by [name] of endocrinology and [name] of nephrology. A repeat creatinine in August 2018 was 1.7.    I last saw her in the office for evaluation in November 2019.  At that time she denied any chest pain, shortness of breath, presyncope or syncope.  She underwent a repeat echo Doppler study on September 02, 2018 which continued to show normal systolic function with an EF of 55 to 78%, grade 1 diastolic dysfunction, moderate aortic stenosis with a mean gradient of 23 and peak gradient of 37 and mitral annular calcification.  Laboratory from [name] in October 2019 revealed a creatinine of 1.64.    Since I last saw her she had a telemedicine evaluation with [name], her primary physician in March 2020 and saw [name]  and had repeat renal function testing.    I last evaluated her on May 12, 2019 and a telemedicine visit. She  had remained asymptomatic and specifically denied any chest pain PND orthopnea, presyncope or syncope.  She denies any palpitations.  At times she notes trivial left ankle edema.    Since I last saw her, she underwent an echo Doppler study on November 06, 2019 which continued to show an EF of 60 to 65%.  There were no regional wall motion abnormalities.  There was moderate LVH and asymmetric left ventricular hypertrophy.  There was moderate aortic stenosis with a mean gradient of 19 and peak gradient of 36.2.  There was mild PR, trivial MR and TR.  Presently, she feels well.  He does admit to shortness of breath with exertion.   She has issues with arthritis.  She denies any chest pain she denies any palpitations.  At times there is trivial left ankle swelling.  She presents for reevaluation.  Past Medical History:  Diagnosis Date  . Anemia    History of , resolved  . Aortic valve sclerosis   . Carpal tunnel syndrome   . Chicken pox   . Chronic low back pain 02/27/2016   -DDD -seeing [facility]   . Diabetes mellitus    type 2, sees Dr. Chalmers Cater  . Goiter    Dr. Chalmers Cater  . Heart murmur   . History of nuclear stress test 06/2010   dipyridamole; normal pattern of perfusion; ekg negative for ischemia; low risk scan   . Hot flashes   . Hyperlipidemia   . Hyperparathyroidism ([place]) 02/27/2016   -sees endocrinologist, Dr. Chalmers Cater, has seen surgeon as well   . Hypertension   . Migraine    no longer has migraines (80's)  . Mitral valve prolapse   . Neuropathy   . Obesity 02/27/2016  . Osteoarthritis    DDD, sees [facility]  . PMB (postmenopausal bleeding) 02/28/2009   Resolved  . Pneumonia    history of   . Renal insufficiency    [name]   . Vaginal atrophy   . Vitamin D deficiency     Past Surgical History:  Procedure Laterality Date  . [place] &1976  . CHOLECYSTECTOMY  1991  . COLONOSCOPY    . DILATATION & CURRETTAGE/HYSTEROSCOPY WITH RESECTOCOPE N/A 05/15/2013   Procedure: [place];  Surgeon: Eldred Manges, MD;  Location: [facility];  Service: Gynecology;  Laterality: N/A;  . DILATATION & CURRETTAGE/HYSTEROSCOPY WITH RESECTOCOPE N/A 06/03/2017   Procedure: DILATATION & CURETTAGE/HYSTEROSCOPY;  Surgeon: Eldred Manges, MD;  Location: [facility];  Service: Gynecology;  Laterality: N/A;  . DILATION AND CURETTAGE OF UTERUS  1975  . [place]  . KNEE SURGERY  2000 2011   right knee replacement   . REPLACEMENT TOTAL KNEE  2000   left  . SHOULDER SURGERY  2004 2010   . TRANSTHORACIC ECHOCARDIOGRAM  07/2010   EF=>55%; LA mildly  dilated; trace MR/TR;  . TUBAL LIGATION  1976    Allergies  Allergen Reactions  . Contrast Media [Iodinated Diagnostic Agents] Other (See Comments)    Reaction unknown  . Nsaids Other (See Comments)    Abnormal renal function  . Sensipar [Cinacalcet Hcl]     Pt stated, "Made me feel bloated, gassy and fluid buildup"  . Penicillins Rash and Other (See Comments)    Pt has taken Keflex without difficulty Has patient had a PCN reaction causing immediate rash, facial/tongue/throat swelling, SOB or lightheadedness with hypotension: yes Has patient had a PCN reaction causing severe rash  involving mucus membranes or skin necrosis: no Has patient had a PCN reaction that required hospitalization no Has patient had a PCN reaction occurring within the last 10 years: no If all of the above answers are "NO", then may proceed with Cephalosporin use.    Current Outpatient Medications  Medication Sig Dispense Refill  . acetaminophen (TYLENOL) 500 MG tablet Take 1 tablet (500 mg total) by mouth every 4 (four) hours as needed for moderate pain. 60 tablet 0  . amLODipine (NORVASC) 5 MG tablet Take 1 tablet (5 mg total) by mouth daily.    [name] Kitchen aspirin (ECOTRIN LOW STRENGTH) 81 MG EC tablet Take 81 mg by mouth at bedtime. Swallow whole.     [name] Kitchen BYSTOLIC 10 MG tablet TAKE 1/2 TABLET BY MOUTH TWICE A DAY 90 tablet 2  . cephALEXin (KEFLEX) 500 MG capsule Take 500-1,000 mg by mouth See admin instructions. TAKE ONLY FOR INVASIVE DENTAL PROCEDURES    . cinacalcet (SENSIPAR) 30 MG tablet 1 TABLET MON, WED, FRI, TAKE WITH LARGEST MEAL OF THE DAY    . furosemide (LASIX) 20 MG tablet Take 40 mg by mouth daily. Can take additional tablet if needed 30 tablet   . gabapentin (NEURONTIN) 100 MG capsule Take 100 mg by mouth at bedtime.    [name] Kitchen HUMULIN N KWIKPEN 100 UNIT/ML Kiwkpen Inject 12 Units into the skin at bedtime.   6  . levothyroxine (SYNTHROID) 50 MCG tablet Take 50 mcg by mouth daily before breakfast.    . lisinopril  (ZESTRIL) 10 MG tablet TAKE 1 TABLET BY MOUTH EVERY DAY 90 tablet 2  . rosuvastatin (CRESTOR) 40 MG tablet TAKE 1 TABLET BY MOUTH AT BEDTIME 90 tablet 2   No current facility-administered medications for this visit.    Social History   Socioeconomic History  . Marital status: Divorced    Spouse name: Not on file  . Number of children: 2  . Years of education: Not on file  . Highest education level: Not on file  Occupational History  . Not on file  Tobacco Use  . Smoking status: Never Smoker  . Smokeless tobacco: Never Used  Substance and Sexual Activity  . Alcohol use: No    Alcohol/week: 0.0 standard drinks  . Drug use: No  . Sexual activity: Not on file  Other Topics Concern  . Not on file  Social History Narrative   Work or School: none      Home Situation: lives alone, feels safe, son lives nearby      Spiritual Beliefs: Christian      Lifestyle: Y 3x per week, healthy diet       01/05/2019: Lives alone in one level house.   Goes to [employer] to do water aerobics 3X/week   Has son and daughter, son local      Social Determinants of Health   Financial Resource Strain: Low Risk   . Difficulty of Paying Living Expenses: Not hard at all  Food Insecurity: No Food Insecurity  . Worried About Charity fundraiser in the Last Year: Never true  . Ran Out of Food in the Last Year: Never true  Transportation Needs: No Transportation Needs  . Lack of Transportation (Medical): No  . Lack of Transportation (Non-Medical): No  Physical Activity: Sufficiently Active  . Days of Exercise per Week: 3 days  . Minutes of Exercise per Session: 60 min  Stress: No Stress Concern Present  . Feeling of Stress : Not at all  Social Connections: Slightly Isolated  . Frequency of Communication with Friends and Family: More than three times a week  . Frequency of Social Gatherings with Friends and Family: Twice a week  . Attends Religious Services: More than 4 times per year  . Active Member  of Clubs or Organizations: Yes  . Attends Archivist Meetings: More than 4 times per year  . Marital Status: Divorced  Human resources officer Violence: Not At Risk  . Fear of Current or Ex-Partner: No  . Emotionally Abused: No  . Physically Abused: No  . Sexually Abused: No   Social history is notable that she's divorced has 2 children one grandchild. She does exercise. There is no tobacco or alcohol use..  Family History  Problem Relation Age of Onset  . Hypertension Mother   . Diabetes Mother   . Hyperlipidemia Mother   . Heart attack Mother   . Heart disease Father   . Heart attack Father   . Stroke Brother   . Heart disease Brother   . Diabetes Brother   . Uterine cancer Maternal Grandmother     ROS General: Negative; No fevers, chills, or night sweats; No significant change in weight HEENT: Negative; No changes in vision or hearing, sinus congestion, difficulty swallowing Pulmonary: Negative; No cough, wheezing, shortness of breath, hemoptysis Cardiovascular: See history of present illness Previous leg swelling had resolved  GI: Negative; No nausea, vomiting, diarrhea, or abdominal pain GU: Negative; No dysuria, hematuria, or difficulty voiding Musculoskeletal: Negative; no myalgias, joint pain, or weakness Hematologic/Oncology: Negative; no easy bruising, bleeding Endocrine: Positive for diabetes mellitus, positive for hyperparathyroidism Neuro: Negative; no changes in balance, headaches Skin: Negative; No rashes or skin lesions Psychiatric: Negative; No behavioral problems, depression Sleep: Negative; No snoring, daytime sleepiness, hypersomnolence, bruxism, restless legs, hypnogognic hallucinations, no cataplexy Other comprehensive 14 point system review is negative.   PE BP 130/72   Pulse 61   Ht _0  (1.676 m)   Wt 195 lb (88.5 kg)   BMI 31.47 kg/m    Repeat blood pressure by me was 138/72  Wt Readings from Last 3 Encounters:  11/29/19 195 lb (88.5  kg)  08/23/19 197 lb (89.4 kg)  05/12/19 193 lb (87.5 kg)   General: Alert, oriented, no distress.  Skin: normal turgor, no rashes, warm and dry HEENT: Normocephalic, atraumatic. Pupils equal round and reactive to light; sclera anicteric; extraocular muscles intact;  Nose without nasal septal hypertrophy Mouth/Parynx benign; Mallinpatti scale Neck: No JVD, no carotid bruits; normal carotid upstroke Lungs: clear to ausculatation and percussion; no wheezing or rales Chest wall: without tenderness to palpitation Heart: PMI not displaced, RRR, s1 s2 normal, 2/6 mid peaking systolic murmur, no diastolic murmur, no rubs, gallops, thrills, or heaves Abdomen: soft, nontender; no hepatosplenomehaly, BS+; abdominal aorta nontender and not dilated by palpation. Back: no CVA tenderness Pulses 2+ Musculoskeletal: full range of motion, normal strength, no joint deformities Extremities: Trivial left ankle edema no clubbing, cyanosis, Homan's sign negative  Neurologic: grossly nonfocal; Cranial nerves grossly wnl Psychologic: Normal mood and affect   ECG (independently read by me): Normal sinus rhythm at 61 bpm with first-degree AV block; PR interval 258 ms.  LVH by voltage criteria in aVL.    November 2019 ECG (independently read by me): Normal sinus rhythm at 61 bpm.  First-degree AV block.  LVH by voltage criteria.  October 2018 ECG (independently read by me): Sinus bradycardia 57 bpm with first-degree AV block.  LVH by voltage  criteria.  PR interval 270 ms.  May 2018 ECG (independently read by me): sinus rhythm with first-degree AV block.  LVH by voltage criteria.  PR interval 250 ms.  April 2017 ECG (independently read by me): Normal sinus rhythm at 61 bpm with first-degree AV block with a PR interval of 244 ms.  LVH by voltage.  September 2016 ECG (independently read by me): Normal sinus rhythm at 60 bpm.  First degree block with a PR interval at 252 ms.  No significant ST-T changes.  October  2015 ECG (independently read by me): Sinus bradycardia at 57 beats per minute.  First degree AV block with a PR interval of 236 ms   September 2014 ECG: Sinus rhythm at 60 beats per minute. First grade AV block with PR interval of 244 ms. QTC interval 356 ms  LABS: BMP Latest Ref Rng & Units 01/11/2018 11/04/2017 05/25/2017  Glucose 70 - 99 mg/dL - 138(H) 256(H)  BUN 4 - 21 43(A) 36(H) 32(H)  Creatinine 0.5 - 1.1 1.7(A) 1.67(H) 1.67(H)  Sodium 137 - 147 142 140 136  Potassium 3.4 - 5.3 4.8 4.4 4.1  Chloride 96 - 112 mEq/L - 103 100(L)  CO2 19 - 32 mEq/L - 30 30  Calcium 8.4 - 10.5 mg/dL - 10.8(H) 10.9(H)   Hepatic Function Latest Ref Rng & Units 05/24/2014 01/23/2014 10/25/2013  Total Protein 6.0 - 8.3 g/dL 8.0 7.8 7.9  Albumin 3.5 - 5.2 g/dL 4.0 3.9 3.8  AST 0 - 37 U/L _0 ALT 0 - 35 U/L _1 Alk Phosphatase 39 - 117 U/L 44 43 46  Total Bilirubin 0.2 - 1.2 mg/dL 0.6 0.5 0.5  Bilirubin, Direct 0.0 - 0.3 mg/dL 0.1 0.0 0.0   CBC Latest Ref Rng & Units 11/04/2017 05/25/2017 10/29/2016  WBC 4.0 - 10.5 K/uL 7.3 6.8 8.1  Hemoglobin 12.0 - 15.0 g/dL 12.3 12.1 12.2  Hematocrit 36.0 - 46.0 % 36.8 36.3 36.2  Platelets 150.0 - 400.0 K/uL 207.0 203 198.0   Lab Results  Component Value Date   MCV 98.8 11/04/2017   MCV 98.1 05/25/2017   MCV 95.7 10/29/2016   Lab Results  Component Value Date   TSH 2.00 01/30/2016   Lab Results  Component Value Date   HGBA1C 7.3 (A) 06/09/2018   Lab Results  Component Value Date   CALCIUM 10.8 (H) 11/04/2017   Lipid Panel     Component Value Date/Time   CHOL 168 11/04/2017 0819   TRIG 90.0 11/04/2017 0819   HDL 55.50 11/04/2017 0819   CHOLHDL 3 11/04/2017 0819   VLDL 18.0 11/04/2017 0819   LDLCALC 95 11/04/2017 0819    RADIOLOGY: No results found.  IMPRESSION:  1. Essential hypertension   2. First degree AV block   3. Nonrheumatic aortic valve stenosis   4. Type 2 diabetes mellitus with microalbuminuria, with long-term current use  of insulin (HCC)   5. Hyperlipidemia with target LDL less than 70   6. Hypercalcemia   7. Hyperparathyroidism ([place])   8. Class 1 obesity due to excess calories with serious comorbidity and body mass index (BMI) of 33.0 to 33.9 in adult     ASSESSMENT AND PLAN: Ms. Lampton is a 86 year old African-American female who has a long-standing history of hypertension with grade 1 diastolic dysfunction and normal systolic function documented by her echocardiogram in September 2011. At that time she had aortic valve sclerosis without stenosis and  mitral annular calcification with trace  MR and trace TR. Her echo Doppler study from 08/23/2015 showed an ejection fraction of 55-60%, mild LVH and grade 1 diastolic dysfunction.  There was very mild aortic stenosis with a mean gradient of 10 and peak gradient of 22.  A 2-year follow-up assessment 08/09/2017 continue to show hyperdynamic LV function with slight increase in her mean gradient at 16 mm with peak of 30 and her October 2019 echo demonstrated mild increase in her gradient with a mean gradient of 23 and peak gradient of 37 mmHg.  I reviewed her most recent echo of November 06, 2019 which continues to show normal systolic function and essentially is unchanged with reference to the severity of aortic stenosis.  Her mean gradient was 19 with peak gradient 36.2.  She denies any chest pain, presyncope or syncope.  At times she does note some very mild shortness of breath particularly with activity.  She is unaware of palpitations.  She admits to some mild left ankle swelling.  Her blood pressure today is stable on her medical regimen consisting of lisinopril 10 mg, amlodipine 5 mg, Bystolic 10 mg, and furosemide 40 mg daily.  There is residual trivial left ankle edema.  She continues to be on insulin for her diabetes mellitus.  She is on Synthroid for hypothyroidism.  She is on Sensipar prescribed by [name] and takes this on Monday Wednesday and Fridays for her  recent PTH and elevated calcium level.  She also has vitamin D insufficiency.  She will be following up with her nephrologist.  Her peripheral neuropathy is controlled with Neurontin.  She continues to be on rosuvastatin for hyperlipidemia.  Target LDL is less than 70.  I will see her in 6 months for reevaluation.  Time spent: 25 minutes  Troy Sine, MD, [facility]  12/01/2019 4:42 PM

## 2019-11-29 NOTE — Patient Instructions (Signed)
Medication Instructions:  No changes *If you need a refill on your cardiac medications before your next appointment, please call your pharmacy*  Lab Work: None Ordered  Testing/Procedures: Copy of Echo report given  Follow-Up: At [facility], you and your health needs are our priority.  As part of our continuing mission to provide you with exceptional heart care, we have created designated Provider Care Teams.  These Care Teams include your primary Cardiologist (physician) and Advanced Practice Providers (APPs -  Physician Assistants and Nurse Practitioners) who all work together to provide you with the care you need, when you need it.  Your next appointment:   6 month(s)  The format for your next appointment:   In Person  Provider:   Shelva Majestic, MD

## 2019-12-01 ENCOUNTER — Encounter: Payer: Self-pay | Admitting: Cardiovascular Disease

## 2019-12-27 ENCOUNTER — Ambulatory Visit: Payer: Medicare Other | Admitting: Family Medicine

## 2019-12-27 ENCOUNTER — Encounter: Payer: Self-pay | Admitting: Family Medicine

## 2019-12-27 ENCOUNTER — Other Ambulatory Visit: Payer: Self-pay

## 2019-12-27 ENCOUNTER — Ambulatory Visit (INDEPENDENT_AMBULATORY_CARE_PROVIDER_SITE_OTHER): Payer: Medicare Other

## 2019-12-27 VITALS — BP 138/62 | HR 62 | Temp 97.7°F | Wt 195.0 lb

## 2019-12-27 DIAGNOSIS — I10 Essential (primary) hypertension: Secondary | ICD-10-CM

## 2019-12-27 DIAGNOSIS — M21941 Unspecified acquired deformity of hand, right hand: Secondary | ICD-10-CM

## 2019-12-27 DIAGNOSIS — Z794 Long term (current) use of insulin: Secondary | ICD-10-CM

## 2019-12-27 DIAGNOSIS — M21942 Unspecified acquired deformity of hand, left hand: Secondary | ICD-10-CM

## 2019-12-27 DIAGNOSIS — E1122 Type 2 diabetes mellitus with diabetic chronic kidney disease: Secondary | ICD-10-CM | POA: Diagnosis not present

## 2019-12-27 LAB — CBC WITH DIFFERENTIAL/PLATELET
Basophils Absolute: 0 10*3/uL (ref 0.0–0.1)
Basophils Relative: 0.5 % (ref 0.0–3.0)
Eosinophils Absolute: 0.4 10*3/uL (ref 0.0–0.7)
Eosinophils Relative: 4.9 % (ref 0.0–5.0)
HCT: 37 % (ref 36.0–46.0)
Hemoglobin: 12.2 g/dL (ref 12.0–15.0)
Lymphocytes Relative: 31 % (ref 12.0–46.0)
Lymphs Abs: 2.4 10*3/uL (ref 0.7–4.0)
MCHC: 33 g/dL (ref 30.0–36.0)
MCV: 96.1 fl (ref 78.0–100.0)
Monocytes Absolute: 0.7 10*3/uL (ref 0.1–1.0)
Monocytes Relative: 8.6 % (ref 3.0–12.0)
Neutro Abs: 4.3 10*3/uL (ref 1.4–7.7)
Neutrophils Relative %: 55 % (ref 43.0–77.0)
Platelets: 200 10*3/uL (ref 150.0–400.0)
RBC: 3.85 Mil/uL — ABNORMAL LOW (ref 3.87–5.11)
RDW: 13.8 % (ref 11.5–15.5)
WBC: 7.8 10*3/uL (ref 4.0–10.5)

## 2019-12-27 LAB — HEMOGLOBIN A1C: Hgb A1c MFr Bld: 7.8 % — ABNORMAL HIGH (ref 4.6–6.5)

## 2019-12-27 NOTE — Progress Notes (Signed)
Subjective:    Patient ID: Bonnie Chavez, female    DOB: Jul 03, 1934, 86 y.o.   MRN: [id number]  No chief complaint on file.   HPI Patient was seen today for f/u.  States has an appt on Monday with Endocrinology, Dr. Chalmers Cater.  BS have been stable 96-120s.  Pt thinks something may be wrong with her glucometer.  At times has difficulty getting the strips in the meter or gets an error message.    BP has been 106-137/58-66.  Pt followed by nephrology, [name].  Recently started on Vit D 2000 IU as vit D was 17.9 on 12/01/19.  Pt notes continued issues with b/l hands.  Notes pain and stiffness in b/l thumbs.  Starting to notice hand deformity.  A copy of pt's labs from 12/01/19 reviewed.  Intact PTH elevated 109, glucose 209, creatinine 1.52, GFR 36, Calcium 10.0, phos 2.9, and vit d as mentioned above.  Past Medical History:  Diagnosis Date  . Anemia    History of , resolved  . Aortic valve sclerosis   . Carpal tunnel syndrome   . Chicken pox   . Chronic low back pain 02/27/2016   -DDD -seeing [facility]   . Diabetes mellitus    type 2, sees Dr. Chalmers Cater  . Goiter    Dr. Chalmers Cater  . Heart murmur   . History of nuclear stress test 06/2010   dipyridamole; normal pattern of perfusion; ekg negative for ischemia; low risk scan   . Hot flashes   . Hyperlipidemia   . Hyperparathyroidism ([place]) 02/27/2016   -sees endocrinologist, Dr. Chalmers Cater, has seen surgeon as well   . Hypertension   . Migraine    no longer has migraines (80's)  . Mitral valve prolapse   . Neuropathy   . Obesity 02/27/2016  . Osteoarthritis    DDD, sees [facility]  . PMB (postmenopausal bleeding) 02/28/2009   Resolved  . Pneumonia    history of   . Renal insufficiency    [name]   . Vaginal atrophy   . Vitamin D deficiency     Allergies  Allergen Reactions  . Contrast Media [Iodinated Diagnostic Agents] Other (See Comments)    Reaction unknown  . Nsaids Other (See Comments)    Abnormal renal function  .  Sensipar [Cinacalcet Hcl]     Pt stated, "Made me feel bloated, gassy and fluid buildup"  . Penicillins Rash and Other (See Comments)    Pt has taken Keflex without difficulty Has patient had a PCN reaction causing immediate rash, facial/tongue/throat swelling, SOB or lightheadedness with hypotension: yes Has patient had a PCN reaction causing severe rash involving mucus membranes or skin necrosis: no Has patient had a PCN reaction that required hospitalization no Has patient had a PCN reaction occurring within the last 10 years: no If all of the above answers are "NO", then may proceed with Cephalosporin use.    ROS General: Denies fever, chills, night sweats, changes in weight, changes in appetite HEENT: Denies headaches, ear pain, changes in vision, rhinorrhea, sore throat CV: Denies CP, palpitations, SOB, orthopnea Pulm: Denies SOB, cough, wheezing GI: Denies abdominal pain, nausea, vomiting, diarrhea, constipation GU: Denies dysuria, hematuria, frequency, vaginal discharge Msk: Denies muscle cramps  + joint pains-hands Neuro: Denies weakness, numbness, tingling Skin: Denies rashes, bruising Psych: Denies depression, anxiety, hallucinations      Objective:    Blood pressure 138/62, pulse 62, temperature 97.7 F (36.5 C), temperature source Temporal, weight 195 lb (  88.5 kg), SpO2 97 %.   Gen. Pleasant, well-nourished, in no distress, normal affect  HEENT: [place]/AT, face symmetric, no scleral icterus, PERRLA, EOMI, nares patent without drainage Lungs: no accessory muscle use, CTAB, no wheezes or rales Cardiovascular: RRR, 3/6 murmur best heard in R upper sternal border, trace peripheral edema at shins Abdomen: BS present, soft, NT/ND, no hepatosplenomegaly. Musculoskeletal: B/l thenar eminence atrophy, b/l thumbs with mild ocntracture, no cyanosis or clubbing, normal tone Neuro:  A&Ox3, CN II-XII intact, normal gait Skin:  Warm, no lesions/ rash  Wt Readings from Last 3  Encounters:  12/27/19 195 lb (88.5 kg)  11/29/19 195 lb (88.5 kg)  08/23/19 197 lb (89.4 kg)    Lab Results  Component Value Date   WBC 7.3 11/04/2017   HGB 12.3 11/04/2017   HCT 36.8 11/04/2017   PLT 207.0 11/04/2017   GLUCOSE 138 (H) 11/04/2017   CHOL 168 11/04/2017   TRIG 90.0 11/04/2017   HDL 55.50 11/04/2017   LDLCALC 95 11/04/2017   ALT 15 05/24/2014   AST 25 05/24/2014   NA 142 01/11/2018   K 4.8 01/11/2018   CL 103 11/04/2017   CREATININE 1.7 (A) 01/11/2018   BUN 43 (A) 01/11/2018   CO2 30 11/04/2017   TSH 2.00 01/30/2016   INR 1.62 (H) 10/30/2010   HGBA1C 7.3 (A) 06/09/2018   MICROALBUR 0.9 05/24/2014    Assessment/Plan:  Type 2 diabetes mellitus with chronic kidney disease, with long-term current use of insulin, unspecified CKD stage (HCC)  -stable -Continue humulin n 12 units qhs and gabapentin 100 mg qhs -continue vitamin d 2000 IU and sensipar 30 mg M, W, F. -Continue f/u with Endocrinology, Dr. Chalmers Cater and Nephrology, [name] - Plan: Hemoglobin A1c  Essential hypertension -controlled -continue lifestyle modifications -continue norvasc 5 mg, bystolic 10 mg, lasix 20 mg, and lisinopril 10 mg daily  Acquired bilateral hand deformity  -likely 2/2 arthritis -discussed obtaining imaging to r/o RA -consider referral to PT/hand surgery vs Ortho - Plan: DG Hand 2 View Right, DG Hand 2 View Left, CBC with Differential/Platelet, Rheumatoid Factor ---Update:  R hand xray with OA greatest in 1st carpometacarpal and scaphotrapeziotrpezoidal joints.   L hand xray with advanced OA at 1st carpometacarpal and scaphotrapeziotrapezoidal joints.   Referrals placed to PT and hand surgery.  F/u in 3-6 months, sooner if needed  Grier Mitts, MD

## 2019-12-27 NOTE — Patient Instructions (Signed)
Arthritis Arthritis is a term that is commonly used to refer to joint pain or joint disease. There are more than 100 types of arthritis. What are the causes? The most common cause of this condition is wear and tear of a joint. Other causes include:  Gout.  Inflammation of a joint.  An infection of a joint.  Sprains and other injuries near the joint.  A reaction to medicines or drugs, or an allergic reaction. In some cases, the cause may not be known. What are the signs or symptoms? The main symptom of this condition is pain in the joint during movement. Other symptoms include:  Redness, swelling, or stiffness at a joint.  Warmth coming from the joint.  Fever.  Overall feeling of illness. How is this diagnosed? This condition may be diagnosed with a physical exam and tests, including:  Blood tests.  Urine tests.  Imaging tests, such as X-rays, an MRI, or a CT scan. Sometimes, fluid is removed from a joint for testing. How is this treated? This condition may be treated with:  Treatment of the cause, if it is known.  Rest.  Raising (elevating) the joint.  Applying cold or hot packs to the joint.  Medicines to improve symptoms and reduce inflammation.  Injections of a steroid such as cortisone into the joint to help reduce pain and inflammation. Depending on the cause of your arthritis, you may need to make lifestyle changes to reduce stress on your joint. Changes may include:  Exercising more.  Losing weight. Follow these instructions at home: Medicines  Take over-the-counter and prescription medicines only as told by your health care provider.  Do not take aspirin to relieve pain if your health care provider thinks that gout may be causing your pain. Activity  Rest your joint if told by your health care provider. Rest is important when your disease is active and your joint feels painful, swollen, or stiff.  Avoid activities that make the pain worse. It is  important to balance activity with rest.  Exercise your joint regularly with range-of-motion exercises as told by your health care provider. Try doing low-impact exercise, such as: ? Swimming. ? Water aerobics. ? Biking. ? Walking. Managing pain, stiffness, and swelling      If directed, put ice on the joint. ? Put ice in a plastic bag. ? Place a towel between your skin and the bag. ? Leave the ice on for 20 minutes, 2-3 times per day.  If your joint is swollen, raise (elevate) it above the level of your heart if directed by your health care provider.  If your joint feels stiff in the morning, try taking a warm shower.  If directed, apply heat to the affected area as often as told by your health care provider. Use the heat source that your health care provider recommends, such as a moist heat pack or a heating pad. If you have diabetes, do not apply heat without permission from your health care provider. To apply heat: ? Place a towel between your skin and the heat source. ? Leave the heat on for 20-30 minutes. ? Remove the heat if your skin turns bright red. This is especially important if you are unable to feel pain, heat, or cold. You may have a greater risk of getting burned. General instructions  Do not use any products that contain nicotine or tobacco, such as cigarettes, e-cigarettes, and chewing tobacco. If you need help quitting, ask your health care provider.  Keep   all follow-up visits as told by your health care provider. This is important. Contact a health care provider if:  The pain gets worse.  You have a fever. Get help right away if:  You develop severe joint pain, swelling, or redness.  Many joints become painful and swollen.  You develop severe back pain.  You develop severe weakness in your leg.  You cannot control your bladder or bowels. Summary  Arthritis is a term that is commonly used to refer to joint pain or joint disease. There are more than  100 types of arthritis.  The most common cause of this condition is wear and tear of a joint. Other causes include gout, inflammation or infection of the joint, sprains, or allergies.  Symptoms of this condition include redness, swelling, or stiffness of the joint. Other symptoms include warmth, fever, or feeling ill.  This condition is treated with rest, elevation, medicines, and applying cold or hot packs.  Follow your health care provider's instructions about medicines, activity, exercises, and other home care treatments. This information is not intended to replace advice given to you by your health care provider. Make sure you discuss any questions you have with your health care provider. Document Revised: 10/17/2018 Document Reviewed: 10/17/2018 Elsevier Patient Education  [place].  Carpal Tunnel Syndrome  Carpal tunnel syndrome is a condition that causes pain in your hand and arm. The carpal tunnel is a narrow area that is on the palm side of your wrist. Repeated wrist motion or certain diseases may cause swelling in the tunnel. This swelling can pinch the main nerve in the wrist (median nerve). What are the causes? This condition may be caused by:  Repeated wrist motions.  Wrist injuries.  Arthritis.  A sac of fluid (cyst) or abnormal growth (tumor) in the carpal tunnel.  Fluid buildup during pregnancy. Sometimes the cause is not known. What increases the risk? The following factors may make you more likely to develop this condition:  Having a job in which you move your wrist in the same way many times. This includes jobs like being a Software engineer or a Scientist, water quality.  Being a woman.  Having other health conditions, such as: ? Diabetes. ? Obesity. ? A thyroid gland that is not active enough (hypothyroidism). ? Kidney failure. What are the signs or symptoms? Symptoms of this condition include:  A tingling feeling in your fingers.  Tingling or a loss of feeling  (numbness) in your hand.  Pain in your entire arm. This pain may get worse when you bend your wrist and elbow for a long time.  Pain in your wrist that goes up your arm to your shoulder.  Pain that goes down into your palm or fingers.  A weak feeling in your hands. You may find it hard to grab and hold items. You may feel worse at night. How is this diagnosed? This condition is diagnosed with a medical history and physical exam. You may also have tests, such as:  Electromyogram (EMG). This test checks the signals that the nerves send to the muscles.  Nerve conduction study. This test checks how well signals pass through your nerves.  Imaging tests, such as X-rays, ultrasound, and MRI. These tests check for what might be the cause of your condition. How is this treated? This condition may be treated with:  Lifestyle changes. You will be asked to stop or change the activity that caused your problem.  Doing exercise and activities that make bones and  muscles stronger (physical therapy).  Learning how to use your hand again (occupational therapy).  Medicines for pain and swelling (inflammation). You may have injections in your wrist.  A wrist splint.  Surgery. Follow these instructions at home: If you have a splint:  Wear the splint as told by your doctor. Remove it only as told by your doctor.  Loosen the splint if your fingers: ? Tingle. ? Lose feeling (become numb). ? Turn cold and blue.  Keep the splint clean.  If the splint is not waterproof: ? Do not let it get wet. ? Cover it with a watertight covering when you take a bath or a shower. Managing pain, stiffness, and swelling   If told, put ice on the painful area: ? If you have a removable splint, remove it as told by your doctor. ? Put ice in a plastic bag. ? Place a towel between your skin and the bag. ? Leave the ice on for 20 minutes, 2-3 times per day. General instructions  Take over-the-counter and  prescription medicines only as told by your doctor.  Rest your wrist from any activity that may cause pain. If needed, talk with your boss at work about changes that can help your wrist heal.  Do any exercises as told by your doctor, physical therapist, or occupational therapist.  Keep all follow-up visits as told by your doctor. This is important. Contact a doctor if:  You have new symptoms.  Medicine does not help your pain.  Your symptoms get worse. Get help right away if:  You have very bad numbness or tingling in your wrist or hand. Summary  Carpal tunnel syndrome is a condition that causes pain in your hand and arm.  It is often caused by repeated wrist motions.  Lifestyle changes and medicines are used to treat this problem. Surgery may help in very bad cases.  Follow your doctor's instructions about wearing a splint, resting your wrist, keeping follow-up visits, and calling for help. This information is not intended to replace advice given to you by your health care provider. Make sure you discuss any questions you have with your health care provider. Document Revised: 03/18/2018 Document Reviewed: 03/18/2018 Elsevier Patient Education  [place].  Diabetes Mellitus and Standards of Medical Care Managing diabetes (diabetes mellitus) can be complicated. Your diabetes treatment may be managed by a team of health care providers, including:  A physician who specializes in diabetes (endocrinologist).  A nurse practitioner or physician assistant.  Nurses.  A diet and nutrition specialist (registered dietitian).  A certified diabetes educator (CDE).  An exercise specialist.  A pharmacist.  An eye doctor.  A foot specialist (podiatrist).  A dentist.  A primary care provider.  A mental health provider. Your health care providers follow guidelines to help you get the best quality of care. The following schedule is a general guideline for your diabetes  management plan. Your health care providers may give you more specific instructions. Physical exams Upon being diagnosed with diabetes mellitus, and each year after that, your health care provider will ask about your medical and family history. He or she will also do a physical exam. Your exam may include:  Measuring your height, weight, and body mass index (BMI).  Checking your blood pressure. This will be done at every routine medical visit. Your target blood pressure may vary depending on your medical conditions, your age, and other factors.  Thyroid gland exam.  Skin exam.  Screening for damage  to your nerves (peripheral neuropathy). This may include checking the pulse in your legs and feet and checking the level of sensation in your hands and feet.  A complete foot exam to inspect the structure and skin of your feet, including checking for cuts, bruises, redness, blisters, sores, or other problems.  Screening for blood vessel (vascular) problems, which may include checking the pulse in your legs and feet and checking your temperature. Blood tests Depending on your treatment plan and your personal needs, you may have the following tests done:  HbA1c (hemoglobin A1c). This test provides information about blood sugar (glucose) control over the previous 2-3 months. It is used to adjust your treatment plan, if needed. This test will be done: ? At least 2 times a year, if you are meeting your treatment goals. ? 4 times a year, if you are not meeting your treatment goals or if treatment goals have changed.  Lipid testing, including total, LDL, and HDL cholesterol and triglyceride levels. ? The goal for LDL is less than 100 mg/dL (5.5 mmol/L). If you are at high risk for complications, the goal is less than 70 mg/dL (3.9 mmol/L). ? The goal for HDL is 40 mg/dL (2.2 mmol/L) or higher for men and 50 mg/dL (2.8 mmol/L) or higher for women. An HDL cholesterol of 60 mg/dL (3.3 mmol/L) or higher  gives some protection against heart disease. ? The goal for triglycerides is less than 150 mg/dL (8.3 mmol/L).  Liver function tests.  Kidney function tests.  Thyroid function tests. Dental and eye exams  Visit your dentist two times a year.  If you have type 1 diabetes, your health care provider may recommend an eye exam 3-5 years after you are diagnosed, and then once a year after your first exam. ? For children with type 1 diabetes, a health care provider may recommend an eye exam when your child is age 63 or older and has had diabetes for 3-5 years. After the first exam, your child should get an eye exam once a year.  If you have type 2 diabetes, your health care provider may recommend an eye exam as soon as you are diagnosed, and then once a year after your first exam. Immunizations   The yearly flu (influenza) vaccine is recommended for everyone 6 months or older who has diabetes.  The pneumonia (pneumococcal) vaccine is recommended for everyone 2 years or older who has diabetes. If you are 35 or older, you may get the pneumonia vaccine as a series of two separate shots.  The hepatitis B vaccine is recommended for adults shortly after being diagnosed with diabetes.  Adults and children with diabetes should receive all other vaccines according to age-specific recommendations from the Centers for Disease Control and Prevention (CDC). Mental and emotional health Screening for symptoms of eating disorders, anxiety, and depression is recommended at the time of diagnosis and afterward as needed. If your screening shows that you have symptoms (positive screening result), you may need more evaluation and you may work with a mental health care provider. Treatment plan Your treatment plan will be reviewed at every medical visit. You and your health care provider will discuss:  How you are taking your medicines, including insulin.  Any side effects you are experiencing.  Your blood  glucose target goals.  The frequency of your blood glucose monitoring.  Lifestyle habits, such as activity level as well as tobacco, alcohol, and substance use. Diabetes self-management education Your health care provider will assess  how well you are monitoring your blood glucose levels and whether you are taking your insulin correctly. He or she may refer you to:  A certified diabetes educator to manage your diabetes throughout your life, starting at diagnosis.  A registered dietitian who can create or review your personal nutrition plan.  An exercise specialist who can discuss your activity level and exercise plan. Summary  Managing diabetes (diabetes mellitus) can be complicated. Your diabetes treatment may be managed by a team of health care providers.  Your health care providers follow guidelines in order to help you get the best quality of care.  Standards of care including having regular physical exams, blood tests, blood pressure monitoring, immunizations, screening tests, and education about how to manage your diabetes.  Your health care providers may also give you more specific instructions based on your individual health. This information is not intended to replace advice given to you by your health care provider. Make sure you discuss any questions you have with your health care provider. Document Revised: 07/29/2018 Document Reviewed: 08/07/2016 Elsevier Patient Education  [place].

## 2019-12-28 LAB — RHEUMATOID FACTOR: Rheumatoid fact SerPl-aCnc: <14 IU/mL (ref <14)

## 2020-01-10 ENCOUNTER — Encounter: Payer: Self-pay | Admitting: Physical Therapy

## 2020-01-10 ENCOUNTER — Other Ambulatory Visit: Payer: Self-pay

## 2020-01-10 ENCOUNTER — Ambulatory Visit: Payer: Medicare Other | Attending: Family Medicine | Admitting: Physical Therapy

## 2020-01-10 DIAGNOSIS — M25642 Stiffness of left hand, not elsewhere classified: Secondary | ICD-10-CM | POA: Diagnosis present

## 2020-01-10 DIAGNOSIS — M6281 Muscle weakness (generalized): Secondary | ICD-10-CM | POA: Diagnosis present

## 2020-01-10 DIAGNOSIS — M25641 Stiffness of right hand, not elsewhere classified: Secondary | ICD-10-CM | POA: Diagnosis present

## 2020-01-10 NOTE — Therapy (Signed)
[facility] [address] [place], [place], [address] Phone: [phone]   Fax:  [phone]  Physical Therapy Evaluation  Patient Details  Name: Bonnie Chavez MRN: [id number] Date of Birth: [birth year] Referring Provider (PT): Grier Mitts, MD    Encounter Date: 01/10/2020  PT End of Session - 01/10/20 1204    Visit Number  1    Date for PT Re-Evaluation  02/23/20    Authorization Type  UHC    Authorization Time Period  01/10/20 to 02/23/20    Authorization - Visit Number  1    Authorization - Number of Visits  10    PT Start Time  0930    PT Stop Time  1010    PT Time Calculation (min)  40 min    Activity Tolerance  No increased pain;Patient tolerated treatment well    Behavior During Therapy  [facility] for tasks assessed/performed       Past Medical History:  Diagnosis Date  . Anemia    History of , resolved  . Aortic valve sclerosis   . Carpal tunnel syndrome   . Chicken pox   . Chronic low back pain 02/27/2016   -DDD -seeing [place] [facility]   . Diabetes mellitus    type 2, sees Dr. Chalmers Cater  . Goiter    Dr. Chalmers Cater  . Heart murmur   . History of nuclear stress test 06/2010   dipyridamole; normal pattern of perfusion; ekg negative for ischemia; low risk scan   . Hot flashes   . Hyperlipidemia   . Hyperparathyroidism ([place]) 02/27/2016   -sees endocrinologist, Dr. Chalmers Cater, has seen surgeon as well   . Hypertension   . Migraine    no longer has migraines (80's)  . Mitral valve prolapse   . Neuropathy   . Obesity 02/27/2016  . Osteoarthritis    DDD, sees [place] [facility]  . PMB (postmenopausal bleeding) 02/28/2009   Resolved  . Pneumonia    history of   . Renal insufficiency    [name]   . Vaginal atrophy   . Vitamin D deficiency     Past Surgical History:  Procedure Laterality Date  . [place] &1976  . CHOLECYSTECTOMY  1991  . COLONOSCOPY    . DILATATION & CURRETTAGE/HYSTEROSCOPY WITH  RESECTOCOPE N/A 05/15/2013   Procedure: [place];  Surgeon: Eldred Manges, MD;  Location: [facility];  Service: Gynecology;  Laterality: N/A;  . DILATATION & CURRETTAGE/HYSTEROSCOPY WITH RESECTOCOPE N/A 06/03/2017   Procedure: DILATATION & CURETTAGE/HYSTEROSCOPY;  Surgeon: Eldred Manges, MD;  Location: [facility];  Service: Gynecology;  Laterality: N/A;  . DILATION AND CURETTAGE OF UTERUS  1975  . [place]  . KNEE SURGERY  2000 2011   right knee replacement   . REPLACEMENT TOTAL KNEE  2000   left  . SHOULDER SURGERY  2004 2010   . TRANSTHORACIC ECHOCARDIOGRAM  07/2010   EF=>55%; LA mildly dilated; trace MR/TR;  . TUBAL LIGATION  1976    There were no vitals filed for this visit.   Subjective Assessment - 01/10/20 0934    Subjective  Pt states that she was referred by her physician to address her arthrtitis in her hands and the two fingers that get caught every now and then. She has pain in the hands at night mainly with her numbness/tingling. She is using an electric jar/can opener because her hands are weak.  Pertinent History  carpal tunnel, arthritis    Diagnostic tests  Xray: OA 1st CMC joints bilaterally    Patient Stated Goals  be able to put earrings on    Currently in Pain?  No/denies         [facility] PT Assessment - 01/10/20 0001      Assessment   Medical Diagnosis  bilateral hand deformity     Referring Provider (PT)  Grier Mitts, MD     Onset Date/Surgical Date  --   around 2019   Hand Dominance  Right    Next MD Visit  in another 3-4 months     Prior Therapy  none for the hands      Precautions   Precautions  None      Balance Screen   Has the patient fallen in the past 6 months  No    Has the patient had a decrease in activity level because of a fear of falling?   No    Is the patient reluctant to leave their home because of a fear of falling?   No      Home Youth worker residence      Prior Function   Vocation  Retired    Leisure  aquatic exercise at the [employer]   Overall Cognitive Status  Within Functional Limits for tasks assessed      Observation/Other Assessments   Observations  Rt and Lt thumb drifting into flexion; Rt 3rd digit trigger finger       Sensation   Additional Comments  pt denies N/T currently, but reports this at night mostly which resolves with repositioning her hands       ROM / Strength   AROM / PROM / Strength  Strength;AROM      AROM   Overall AROM Comments  Rt 3rd digit DIP 40 deg flexion contracture, Lt 30 deg contracture     AROM Assessment Site  Wrist    Right/Left Wrist  Left    Right Wrist Extension  30 Degrees    Right Wrist Flexion  30 Degrees    Left Wrist Extension  30 Degrees    Left Wrist Flexion  60 Degrees      Strength   Strength Assessment Site  Hand;Wrist    Right/Left Wrist  Right;Left    Right Wrist Flexion  3+/5    Right Wrist Extension  4/5    Left Wrist Flexion  3+/5    Left Wrist Extension  4/5    Right/Left hand  Left;Right    Right Hand Grip (lbs)  13    Right Hand Lateral Pinch  --   5.6 lb   Left Hand Grip (lbs)  18    Left Hand Lateral Pinch  --   6.6 lb                Objective measurements completed on examination: See above findings.      [facility] Adult PT Treatment/Exercise - 01/10/20 0001      Exercises   Exercises  Wrist      Wrist Exercises   Wrist Flexion  PROM;Right    Wrist Flexion Limitations  2x5 sec hold, HEP demo     Wrist Extension  PROM;Left;Right;5 reps    Wrist Extension Limitations  prayer stretch HEP demo     Other wrist exercises  Rt towel squeeze 2x5 sec hold HEP  demo              PT Education - 01/10/20 1158    Education Details  eval findings/POC; implemented and reviewed HEP    Person(s) Educated  Patient    Methods  Explanation;Demonstration;Handout    Comprehension  Verbalized understanding;Returned  demonstration       PT Short Term Goals - 01/10/20 1211      PT SHORT TERM GOAL #1   Title  Pt will be independent with her initial HEP to increase ROM and strength of the hands.    Time  3    Period  Weeks    Status  New    Target Date  01/31/20        PT Long Term Goals - 01/10/20 1212      PT LONG TERM GOAL #1   Title  Pt will have atleast 60 deg of Rt wrist flexion which will increase her independence with daily activity.    Time  6    Period  Weeks    Status  New    Target Date  02/23/20      PT LONG TERM GOAL #2   Title  Pt will have atleast 15lb improvement in Rt and Lt gross grip strength which will increase her ability to open jars independently.    Time  6    Period  Weeks    Status  New      PT LONG TERM GOAL #3   Title  Pt will have atleast 10lb improvement in pincer grip strength to assist with putting earrings on in the mornings.    Time  6    Period  Weeks    Status  New      PT LONG TERM GOAL #4   Title  Pt will have 4+/5 MMT of both wrists to improve independence with daily activity.    Time  6    Period  Weeks    Status  New      PT LONG TERM GOAL #5   Title  Pt will report atleast 50% improvement in wrist/hand strength and flexibility from the start of PT.    Time  6    Period  Weeks    Status  New             Plan - 01/10/20 1159    Clinical Impression Statement  Pt is a pleasant 86 yo. F referred to OPPT with complaints of chronic bilateral hand pain, stiffness and carpal tunnel syndrome. Pt has osteoarthritis of the CMC joints bilaterally, and she notes frequent numbness/tingling in the fingers and hands at night. Pt has significant restrictions in wrist mobility, only 30 deg of flexion bilaterally and her gross grip and pincer grip are limited. PT noted 3rd digit PIP contractures bilaterally, worse on the Rt. Because of the pt's hand weakness and stiffness, she requires assistance with opening jars, putting on earrings and other  self-care tasks. She would benefit from skilled PT to increase ROM, strength and functional use of the UEs.    Personal Factors and Comorbidities  Comorbidity 3+;Age;Time since onset of injury/illness/exacerbation;Past/Current Experience    Comorbidities  carpal tunnel, trigger finger, OA    Examination-Activity Limitations  Dressing;Lift;Bathing;Carry    Examination-Participation Restrictions  Meal Prep;Cleaning    Stability/Clinical Decision Making  Evolving/Moderate complexity    Clinical Decision Making  Moderate    Rehab Potential  Good    PT Frequency  2x / week  PT Duration  6 weeks    PT Treatment/Interventions  ADLs/Self Care Home Management;Cryotherapy;Moist Heat;Neuromuscular re-education;Therapeutic exercise;Therapeutic activities;Patient/family education;Manual techniques;Passive range of motion;Taping    PT Next Visit Plan  wrist and thumb mobility; progress wrist and hand strength as able; finger dexterity    PT Home Exercise Plan  [email or web address]    Consulted and Agree with Plan of Care  Patient       Patient will benefit from skilled therapeutic intervention in order to improve the following deficits and impairments:  Impaired flexibility, Impaired UE functional use, Hypomobility, Decreased strength, Decreased range of motion, Pain  Visit Diagnosis: Stiffness of right hand, not elsewhere classified  Stiffness of left hand, not elsewhere classified  Muscle weakness (generalized)     Problem List Patient Active Problem List   Diagnosis Date Noted  . Kidney disease 09/15/2018  . Left ear impacted cerumen 02/01/2018  . Hyperlipidemia associated with type 2 diabetes mellitus ([place]) 11/04/2017  . Sensorineural hearing loss (SNHL) of both ears 01/19/2017  . Mild aortic stenosis 03/10/2016  . Hyperparathyroidism ([place]) 02/27/2016  . Chronic low back pain 02/27/2016  . Morbid obesity ([place]) 02/27/2016  . Chronic diastolic congestive heart failure ([place]) 10/28/2015  . Aortic  valve sclerosis 08/15/2015  . Chronic kidney disease 05/24/2014  . Hypertension associated with diabetes ([place]) 08/21/2013  . Hyperlipidemia 08/21/2013  . PMB (postmenopausal bleeding) 05/15/2013  . Multiple thyroid nodules 03/09/2013  . Unspecified vitamin D deficiency 03/09/2013  . Perimenopausal vasomotor symptoms 02/06/2010    Class: History of  . Osteoarthritis 02/06/2010  . Vaginal atrophy 01/12/2002    Class: History of  . Diabetes mellitus, type II ([place]) 09/07/2001    Class: History of    12:20 PM,01/10/20 Sherol Dade PT, DPT [facility] [address], [place], [place], [address] Phone: [phone]   Fax:  [phone]  Name: Bonnie Chavez MRN: [id number] Date of Birth: 1934/04/14

## 2020-01-10 NOTE — Patient Instructions (Signed)
Access Code: [email or web address]  URL: [email or web address]  Date: 01/10/2020  Prepared by: Sherol Dade    Exercises Wrist Prayer Stretch- 5 reps- 10 hold- 2x daily- 7x weekly  Seated Wrist Flexion Extension PROM- 5 reps- 10 hold- 2x daily- 7x weekly  Towel Roll Squeeze- 10 reps- 5 seconds hold- 2x daily- 7x weekly    [facility] [address] [place], [place] [address] Phone # [phone] Fax [phone]

## 2020-01-15 ENCOUNTER — Ambulatory Visit: Payer: Medicare Other | Admitting: Physical Therapy

## 2020-01-15 ENCOUNTER — Other Ambulatory Visit: Payer: Self-pay

## 2020-01-15 ENCOUNTER — Encounter: Payer: Self-pay | Admitting: Physical Therapy

## 2020-01-15 DIAGNOSIS — M25642 Stiffness of left hand, not elsewhere classified: Secondary | ICD-10-CM

## 2020-01-15 DIAGNOSIS — M6281 Muscle weakness (generalized): Secondary | ICD-10-CM

## 2020-01-15 DIAGNOSIS — M25641 Stiffness of right hand, not elsewhere classified: Secondary | ICD-10-CM

## 2020-01-15 NOTE — Therapy (Signed)
[facility] [address] [place], [place], [address] Phone: [phone]   Fax:  [phone]  Physical Therapy Treatment  Patient Details  Name: Bonnie Chavez MRN: [id number] Date of Birth: 06-Oct-1934 Referring Provider (PT): Grier Mitts, MD    Encounter Date: 01/15/2020  PT End of Session - 01/15/20 1110    Visit Number  2    Date for PT Re-Evaluation  02/23/20    Authorization Type  UHC    Authorization Time Period  01/10/20 to 02/23/20    Authorization - Visit Number  2    Authorization - Number of Visits  10    PT Start Time  [id number]    PT Stop Time  1145    PT Time Calculation (min)  40 min    Activity Tolerance  No increased pain;Patient tolerated treatment well    Behavior During Therapy  [facility] for tasks assessed/performed       Past Medical History:  Diagnosis Date  . Anemia    History of , resolved  . Aortic valve sclerosis   . Carpal tunnel syndrome   . Chicken pox   . Chronic low back pain 02/27/2016   -DDD -seeing [facility]   . Diabetes mellitus    type 2, sees Dr. Chalmers Cater  . Goiter    Dr. Chalmers Cater  . Heart murmur   . History of nuclear stress test 06/2010   dipyridamole; normal pattern of perfusion; ekg negative for ischemia; low risk scan   . Hot flashes   . Hyperlipidemia   . Hyperparathyroidism ([place]) 02/27/2016   -sees endocrinologist, Dr. Chalmers Cater, has seen surgeon as well   . Hypertension   . Migraine    no longer has migraines (80's)  . Mitral valve prolapse   . Neuropathy   . Obesity 02/27/2016  . Osteoarthritis    DDD, sees [facility]  . PMB (postmenopausal bleeding) 02/28/2009   Resolved  . Pneumonia    history of   . Renal insufficiency    [name]   . Vaginal atrophy   . Vitamin D deficiency     Past Surgical History:  Procedure Laterality Date  . [place] &1976  . CHOLECYSTECTOMY  1991  . COLONOSCOPY    . DILATATION & CURRETTAGE/HYSTEROSCOPY WITH  RESECTOCOPE N/A 05/15/2013   Procedure: [place];  Surgeon: Eldred Manges, MD;  Location: [facility];  Service: Gynecology;  Laterality: N/A;  . DILATATION & CURRETTAGE/HYSTEROSCOPY WITH RESECTOCOPE N/A 06/03/2017   Procedure: DILATATION & CURETTAGE/HYSTEROSCOPY;  Surgeon: Eldred Manges, MD;  Location: [facility];  Service: Gynecology;  Laterality: N/A;  . DILATION AND CURETTAGE OF UTERUS  1975  . [place]  . KNEE SURGERY  2000 2011   right knee replacement   . REPLACEMENT TOTAL KNEE  2000   left  . SHOULDER SURGERY  2004 2010   . TRANSTHORACIC ECHOCARDIOGRAM  07/2010   EF=>55%; LA mildly dilated; trace MR/TR;  . TUBAL LIGATION  1976    There were no vitals filed for this visit.  Subjective Assessment - 01/15/20 1107    Subjective  Pain was really bad over the weekend.  Today is better maybe a 3/10 both hands.    Pertinent History  carpal tunnel, arthritis    Diagnostic tests  Xray: OA 1st CMC joints bilaterally    Patient Stated Goals  be able to put earrings on    Currently in  Pain?  Yes    Pain Score  3     Pain Location  Hand    Pain Orientation  Right;Left    Pain Descriptors / Indicators  Dull    Pain Type  Chronic pain                       [facility] Adult PT Treatment/Exercise - 01/15/20 0001      Exercises   Exercises  Hand;Wrist      Hand Exercises   Thumb Opposition  bil x 5 cycles, to each finger    Other Hand Exercises  towel grip squeeze 10x5 sec, lumbrical squeeze 10x5 sec, key grip squeeze 10x5 sec, all bil, PT cued to only apply as much pressure as she could without angulations at thumb    Other Hand Exercises  passive self stretch finger extension (see [employer] for technique)      Wrist Exercises   Wrist Flexion  AROM;Both;10 reps;Seated    Wrist Extension  AROM;Both;10 reps;Seated    Wrist Radial Deviation  AROM;Both;10 reps;Seated    Wrist Ulnar Deviation  Strengthening;Both;10 reps     Wrist Ulnar Deviation Limitations  isometric into towel 5 sec hold x 10      Modalities   Modalities  Moist Heat      Moist Heat Therapy   Number Minutes Moist Heat  10 Minutes    Moist Heat Location  Hand   bil, beginning of session     Manual Therapy   Manual Therapy  Soft tissue mobilization;Joint mobilization;Passive ROM    Joint Mobilization  bil 1st CMC joint distraction and glides M/L/A/P Gr I-III bil    Soft tissue mobilization  thenar eminence bil    Passive ROM  bil wrist flexion/extension             PT Education - 01/15/20 1145    Education Details  Access Code: [email or web address]    Person(s) Educated  Patient    Methods  Explanation;Demonstration;Verbal cues;Handout    Comprehension  Verbalized understanding;Returned demonstration       PT Short Term Goals - 01/10/20 1211      PT SHORT TERM GOAL #1   Title  Pt will be independent with her initial HEP to increase ROM and strength of the hands.    Time  3    Period  Weeks    Status  New    Target Date  01/31/20        PT Long Term Goals - 01/10/20 1212      PT LONG TERM GOAL #1   Title  Pt will have atleast 60 deg of Rt wrist flexion which will increase her independence with daily activity.    Time  6    Period  Weeks    Status  New    Target Date  02/23/20      PT LONG TERM GOAL #2   Title  Pt will have atleast 15lb improvement in Rt and Lt gross grip strength which will increase her ability to open jars independently.    Time  6    Period  Weeks    Status  New      PT LONG TERM GOAL #3   Title  Pt will have atleast 10lb improvement in pincer grip strength to assist with putting earrings on in the mornings.    Time  6    Period  Weeks    Status  New  PT LONG TERM GOAL #4   Title  Pt will have 4+/5 MMT of both wrists to improve independence with daily activity.    Time  6    Period  Weeks    Status  New      PT LONG TERM GOAL #5   Title  Pt will report atleast 50% improvement in  wrist/hand strength and flexibility from the start of PT.    Time  6    Period  Weeks    Status  New            Plan - 01/15/20 1250    Clinical Impression Statement  Pt arrived with low grade pain in bil hands.  PT used heat on bil hands prior to performing manual therapy and progressing ther ex.  PT performed manual therapy concurrent with Pt performing all ther ex on the other hand/wrist.  Pt needed VCs for all types of grip strength to ensure she did not exacerbate angulation of thumbs.  She was able to perform all A/ROM ther ex without pain today.  PT discussed the idea of getting some play doh or putty and included some exercises in HEP as Pt said she could easily get some after session today.  She was very pleased end of session and felt she had good pain relief from combo of A/ROM, isometric strength and manual techniques.  PT encouraged her to use heat via hot towels or heating pads at home for continued relief in presence of signif OA in hands.    Comorbidities  carpal tunnel, trigger finger, OA    Rehab Potential  Good    PT Frequency  2x / week    PT Duration  6 weeks    PT Treatment/Interventions  ADLs/Self Care Home Management;Cryotherapy;Moist Heat;Neuromuscular re-education;Therapeutic exercise;Therapeutic activities;Patient/family education;Manual techniques;Passive range of motion;Taping    PT Next Visit Plan  did Pt get putty or play doh?  heat bil hands, Review HEP, progress as tol, manual techniques for improved function and dexterity.    PT Home Exercise Plan  [email or web address]    Consulted and Agree with Plan of Care  Patient       Patient will benefit from skilled therapeutic intervention in order to improve the following deficits and impairments:     Visit Diagnosis: Stiffness of right hand, not elsewhere classified  Stiffness of left hand, not elsewhere classified  Muscle weakness (generalized)     Problem List Patient Active Problem List   Diagnosis Date Noted   . Kidney disease 09/15/2018  . Left ear impacted cerumen 02/01/2018  . Hyperlipidemia associated with type 2 diabetes mellitus ([place]) 11/04/2017  . Sensorineural hearing loss (SNHL) of both ears 01/19/2017  . Mild aortic stenosis 03/10/2016  . Hyperparathyroidism ([place]) 02/27/2016  . Chronic low back pain 02/27/2016  . Morbid obesity ([place]) 02/27/2016  . Chronic diastolic congestive heart failure ([place]) 10/28/2015  . Aortic valve sclerosis 08/15/2015  . Chronic kidney disease 05/24/2014  . Hypertension associated with diabetes ([place]) 08/21/2013  . Hyperlipidemia 08/21/2013  . PMB (postmenopausal bleeding) 05/15/2013  . Multiple thyroid nodules 03/09/2013  . Unspecified vitamin D deficiency 03/09/2013  . Perimenopausal vasomotor symptoms 02/06/2010    Class: History of  . Osteoarthritis 02/06/2010  . Vaginal atrophy 01/12/2002    Class: History of  . Diabetes mellitus, type II ([place]) 09/07/2001    Class: History of    Baruch Merl, PT 01/15/20 12:56 PM   [facility] [address]  [address], [place], [place], [address] Phone: [phone]   Fax:  [phone]  Name: Bonnie Chavez MRN: [id number] Date of Birth: [birth year]

## 2020-01-15 NOTE — Patient Instructions (Signed)
Access Code: [email or web address]  URL: [email or web address]  Date: 01/15/2020  Prepared by: Lora Havens Code: [email or web address]  URL: [email or web address]  Date: 01/15/2020  Prepared by: Venetia Night [name]   Exercises  Wrist Prayer Stretch - 5 reps - 10 hold - 2x daily - 7x weekly  Seated Wrist Flexion Extension PROM - 5 reps - 10 hold - 2x daily - 7x weekly  Wrist AROM Flexion Extension - 10 reps - 1 sets - 1x daily - 7x weekly  Thumb Opposition - 10 reps - 1 sets - 1x daily - 7x weekly  Towel Roll Squeeze - 10 reps - 5 seconds hold - 2x daily - 7x weekly  Finger Lumbricals with Putty - 10 reps - 1 sets - 5 hold - 1x daily - 7x weekly  Finger Key Grip with Putty - 10 reps - 1 sets - 5 hold - 1x daily - 7x weekly  Hand PROM Finger Extension - 5 reps - 1 sets - 10 hold - 1x daily - 7x weekly  Putty Squeezes - 60 reps - 1 sets - 1x daily - 7x weekly  Finger Extension with Putty - 60 reps - 1 sets - 1x daily - 7x weekly  Bonnie Chavez   Exercises  Wrist Prayer Stretch - 5 reps - 10 hold - 2x daily - 7x weekly  Seated Wrist Flexion Extension PROM - 5 reps - 10 hold - 2x daily - 7x weekly  Wrist AROM Flexion Extension - 10 reps - 1 sets - 1x daily - 7x weekly  Thumb Opposition - 10 reps - 1 sets - 1x daily - 7x weekly  Towel Roll Squeeze - 10 reps - 5 seconds hold - 2x daily - 7x weekly  Finger Lumbricals with Putty - 10 reps - 1 sets - 5 hold - 1x daily - 7x weekly  Finger Key Grip with Putty - 10 reps - 1 sets - 5 hold - 1x daily - 7x weekly  Hand PROM Finger Extension - 5 reps - 1 sets - 10 hold - 1x daily - 7x weekly  Putty Squeezes - 60 reps - 1 sets - 1x daily - 7x weekly  Finger Extension with Putty - 60 reps - 1 sets - 1x daily - 7x weekly

## 2020-01-19 ENCOUNTER — Other Ambulatory Visit: Payer: Self-pay

## 2020-01-19 ENCOUNTER — Encounter: Payer: Self-pay | Admitting: Physical Therapy

## 2020-01-19 ENCOUNTER — Telehealth: Payer: Self-pay | Admitting: Family Medicine

## 2020-01-19 ENCOUNTER — Ambulatory Visit: Payer: Medicare Other | Admitting: Physical Therapy

## 2020-01-19 DIAGNOSIS — M25641 Stiffness of right hand, not elsewhere classified: Secondary | ICD-10-CM

## 2020-01-19 DIAGNOSIS — M25642 Stiffness of left hand, not elsewhere classified: Secondary | ICD-10-CM

## 2020-01-19 DIAGNOSIS — M6281 Muscle weakness (generalized): Secondary | ICD-10-CM

## 2020-01-19 NOTE — Patient Instructions (Signed)
Playdough;   2x day  1. Just play randomly with it 2 min 2. "Finger pulls" Put the dough flat on the table, and with your fingers scrape in a downward fashion making your fingers bend. 10x each finger 3. Roll balls of different sizes with your fingers. Do 2 min or so.  4. Roll long snakes with the dough. Do this 2 min or so  You can also look into a Parafin bath. This would be the heating application. Look at [employer].

## 2020-01-19 NOTE — Therapy (Signed)
[facility] [address], [place], [place], [address] Phone: [phone]   Fax:  [phone]  Physical Therapy Treatment  Patient Details  Name: Bonnie Chavez MRN: [id number] Date of Birth: Jul 03, 1934 Referring Provider (PT): Grier Mitts, MD    Encounter Date: 01/19/2020  PT End of Session - 01/19/20 0940    Visit Number  3    Date for PT Re-Evaluation  02/23/20    Authorization Type  UHC    Authorization Time Period  01/10/20 to 02/23/20    Authorization - Visit Number  3    Authorization - Number of Visits  10    PT Start Time  0933    PT Stop Time  1017    PT Time Calculation (min)  44 min    Activity Tolerance  Patient tolerated treatment well    Behavior During Therapy  [facility] for tasks assessed/performed       Past Medical History:  Diagnosis Date  . Anemia    History of , resolved  . Aortic valve sclerosis   . Carpal tunnel syndrome   . Chicken pox   . Chronic low back pain 02/27/2016   -DDD -seeing [place] [facility]   . Diabetes mellitus    type 2, sees Dr. Chalmers Cater  . Goiter    Dr. Chalmers Cater  . Heart murmur   . History of nuclear stress test 06/2010   dipyridamole; normal pattern of perfusion; ekg negative for ischemia; low risk scan   . Hot flashes   . Hyperlipidemia   . Hyperparathyroidism ([place]) 02/27/2016   -sees endocrinologist, Dr. Chalmers Cater, has seen surgeon as well   . Hypertension   . Migraine    no longer has migraines (80's)  . Mitral valve prolapse   . Neuropathy   . Obesity 02/27/2016  . Osteoarthritis    DDD, sees [place] [facility]  . PMB (postmenopausal bleeding) 02/28/2009   Resolved  . Pneumonia    history of   . Renal insufficiency    [name]   . Vaginal atrophy   . Vitamin D deficiency     Past Surgical History:  Procedure Laterality Date  . [place] &1976  . CHOLECYSTECTOMY  1991  . COLONOSCOPY    . DILATATION & CURRETTAGE/HYSTEROSCOPY WITH RESECTOCOPE N/A  05/15/2013   Procedure: [place];  Surgeon: Eldred Manges, MD;  Location: [facility];  Service: Gynecology;  Laterality: N/A;  . DILATATION & CURRETTAGE/HYSTEROSCOPY WITH RESECTOCOPE N/A 06/03/2017   Procedure: DILATATION & CURETTAGE/HYSTEROSCOPY;  Surgeon: Eldred Manges, MD;  Location: [facility];  Service: Gynecology;  Laterality: N/A;  . DILATION AND CURETTAGE OF UTERUS  1975  . [place]  . KNEE SURGERY  2000 2011   right knee replacement   . REPLACEMENT TOTAL KNEE  2000   left  . SHOULDER SURGERY  2004 2010   . TRANSTHORACIC ECHOCARDIOGRAM  07/2010   EF=>55%; LA mildly dilated; trace MR/TR;  . TUBAL LIGATION  1976    There were no vitals filed for this visit.  Subjective Assessment - 01/19/20 0943    Subjective  I bought some play dough. No pain right now but I have tingling.    Pertinent History  carpal tunnel, arthritis    Diagnostic tests  Xray: OA 1st CMC joints bilaterally    Currently in Pain?  No/denies  [place] Adult PT Treatment/Exercise - 01/19/20 0001      Hand Exercises   Other Hand Exercises  small velcro: each finger extension 5x, then flexion 5x; VC to focus on fingers doing work and not the entire arm       Wrist Exercises   Wrist Flexion  AROM;Both;10 reps;Seated    Wrist Extension  AROM;Both;10 reps;Seated    Other wrist exercises  Wrist extenor stretch 20 sec, flexor stretch 20 sec BIL      Moist Heat Therapy   Number Minutes Moist Heat  10 Minutes    Moist Heat Location  Hand   bil, beginning of session     Manual Therapy   Manual Therapy  Soft tissue mobilization;Joint mobilization;Passive ROM    Soft tissue mobilization  RT > LT wrist extensors & flexors, Rt thenar eminence    Passive ROM  bil wrist flexion/extension, fBil fingers ( all joints)             PT Education - 01/19/20 1114    Education Details  Educated pt on putt exercises and typed  out some additional exercises.    Person(s) Educated  Patient    Methods  Explanation;Verbal cues;Handout    Comprehension  Returned demonstration;Verbalized understanding       PT Short Term Goals - 01/19/20 1002      PT SHORT TERM GOAL #1   Title  Pt will be independent with her initial HEP to increase ROM and strength of the hands.    Time  3    Period  Weeks    Status  Achieved    Target Date  01/31/20        PT Long Term Goals - 01/10/20 1212      PT LONG TERM GOAL #1   Title  Pt will have atleast 60 deg of Rt wrist flexion which will increase her independence with daily activity.    Time  6    Period  Weeks    Status  New    Target Date  02/23/20      PT LONG TERM GOAL #2   Title  Pt will have atleast 15lb improvement in Rt and Lt gross grip strength which will increase her ability to open jars independently.    Time  6    Period  Weeks    Status  New      PT LONG TERM GOAL #3   Title  Pt will have atleast 10lb improvement in pincer grip strength to assist with putting earrings on in the mornings.    Time  6    Period  Weeks    Status  New      PT LONG TERM GOAL #4   Title  Pt will have 4+/5 MMT of both wrists to improve independence with daily activity.    Time  6    Period  Weeks    Status  New      PT LONG TERM GOAL #5   Title  Pt will report atleast 50% improvement in wrist/hand strength and flexibility from the start of PT.    Time  6    Period  Weeks    Status  New            Plan - 01/19/20 0941    Clinical Impression Statement  Pt arrives with no pain, fingers feel stiff, and some tingling in her hands. We worked on finger and wrist ROM, soft tissue work  to her forearm muscles, and finally strengthening to her hands/fingers and grip.    Personal Factors and Comorbidities  Comorbidity 3+;Age;Time since onset of injury/illness/exacerbation;Past/Current Experience    Comorbidities  carpal tunnel, trigger finger, OA    Examination-Activity  Limitations  Dressing;Lift;Bathing;Carry    Examination-Participation Restrictions  Meal Prep;Cleaning    Stability/Clinical Decision Making  Evolving/Moderate complexity    Rehab Potential  Good    PT Frequency  2x / week    PT Duration  6 weeks    PT Treatment/Interventions  ADLs/Self Care Home Management;Cryotherapy;Moist Heat;Neuromuscular re-education;Therapeutic exercise;Therapeutic activities;Patient/family education;Manual techniques;Passive range of motion;Taping    PT Next Visit Plan  Continue to work with finger/hand/wrist strength andROM    PT Home Exercise Plan  [id number]    Consulted and Agree with Plan of Care  Patient       Patient will benefit from skilled therapeutic intervention in order to improve the following deficits and impairments:  Impaired flexibility, Impaired UE functional use, Hypomobility, Decreased strength, Decreased range of motion, Pain  Visit Diagnosis: Stiffness of right hand, not elsewhere classified  Stiffness of left hand, not elsewhere classified  Muscle weakness (generalized)     Problem List Patient Active Problem List   Diagnosis Date Noted  . Kidney disease 09/15/2018  . Left ear impacted cerumen 02/01/2018  . Hyperlipidemia associated with type 2 diabetes mellitus ([place]) 11/04/2017  . Sensorineural hearing loss (SNHL) of both ears 01/19/2017  . Mild aortic stenosis 03/10/2016  . Hyperparathyroidism ([place]) 02/27/2016  . Chronic low back pain 02/27/2016  . Morbid obesity ([place]) 02/27/2016  . Chronic diastolic congestive heart failure ([place]) 10/28/2015  . Aortic valve sclerosis 08/15/2015  . Chronic kidney disease 05/24/2014  . Hypertension associated with diabetes ([place]) 08/21/2013  . Hyperlipidemia 08/21/2013  . PMB (postmenopausal bleeding) 05/15/2013  . Multiple thyroid nodules 03/09/2013  . Unspecified vitamin D deficiency 03/09/2013  . Perimenopausal vasomotor symptoms 02/06/2010    Class: History of  . Osteoarthritis 02/06/2010   . Vaginal atrophy 01/12/2002    Class: History of  . Diabetes mellitus, type II ([place]) 09/07/2001    Class: History of    Isidra Mings, PTA 01/19/2020, 11:18 AM  [facility] [address] [place], [place], [address] Phone: [phone]   Fax:  [phone]  Name: Bonnie Chavez MRN: [id number] Date of Birth: 1934/05/28

## 2020-01-19 NOTE — Telephone Encounter (Signed)
Pt called and said that she was returning a call from [place] and would like a call back.

## 2020-01-19 NOTE — Telephone Encounter (Signed)
Spoke with pt verbalized understanding of her x-ray results and recommendations, pt copy of the results was mailed out to her home address

## 2020-01-24 ENCOUNTER — Encounter: Payer: Self-pay | Admitting: Physical Therapy

## 2020-01-24 ENCOUNTER — Ambulatory Visit: Payer: Medicare Other | Attending: Family Medicine | Admitting: Physical Therapy

## 2020-01-24 ENCOUNTER — Other Ambulatory Visit: Payer: Self-pay

## 2020-01-24 DIAGNOSIS — M6281 Muscle weakness (generalized): Secondary | ICD-10-CM | POA: Diagnosis present

## 2020-01-24 DIAGNOSIS — M25642 Stiffness of left hand, not elsewhere classified: Secondary | ICD-10-CM | POA: Diagnosis present

## 2020-01-24 DIAGNOSIS — M25641 Stiffness of right hand, not elsewhere classified: Secondary | ICD-10-CM | POA: Diagnosis present

## 2020-01-24 NOTE — Therapy (Signed)
[facility] [address] [place], [place], [address] Phone: [phone]   Fax:  [phone]  Physical Therapy Treatment  Patient Details  Name: Bonnie Chavez MRN: [id number] Date of Birth: [birth year] Referring Provider (PT): Grier Mitts, MD    Encounter Date: 01/24/2020  PT End of Session - 01/24/20 1010    Visit Number  4    Date for PT Re-Evaluation  02/23/20    Authorization Type  UHC    Authorization Time Period  01/10/20 to 02/23/20    Authorization - Visit Number  4    Authorization - Number of Visits  10    PT Start Time  0930    PT Stop Time  1012    PT Time Calculation (min)  42 min    Activity Tolerance  Patient tolerated treatment well    Behavior During Therapy  [facility] for tasks assessed/performed       Past Medical History:  Diagnosis Date  . Anemia    History of , resolved  . Aortic valve sclerosis   . Carpal tunnel syndrome   . Chicken pox   . Chronic low back pain 02/27/2016   -DDD -seeing [place] [facility]   . Diabetes mellitus    type 2, sees Dr. Chalmers Cater  . Goiter    Dr. Chalmers Cater  . Heart murmur   . History of nuclear stress test 06/2010   dipyridamole; normal pattern of perfusion; ekg negative for ischemia; low risk scan   . Hot flashes   . Hyperlipidemia   . Hyperparathyroidism ([place]) 02/27/2016   -sees endocrinologist, Dr. Chalmers Cater, has seen surgeon as well   . Hypertension   . Migraine    no longer has migraines (80's)  . Mitral valve prolapse   . Neuropathy   . Obesity 02/27/2016  . Osteoarthritis    DDD, sees [place] [facility]  . PMB (postmenopausal bleeding) 02/28/2009   Resolved  . Pneumonia    history of   . Renal insufficiency    [name]   . Vaginal atrophy   . Vitamin D deficiency     Past Surgical History:  Procedure Laterality Date  . [place] &1976  . CHOLECYSTECTOMY  1991  . COLONOSCOPY    . DILATATION & CURRETTAGE/HYSTEROSCOPY WITH RESECTOCOPE N/A 05/15/2013    Procedure: [place];  Surgeon: Eldred Manges, MD;  Location: [facility];  Service: Gynecology;  Laterality: N/A;  . DILATATION & CURRETTAGE/HYSTEROSCOPY WITH RESECTOCOPE N/A 06/03/2017   Procedure: DILATATION & CURETTAGE/HYSTEROSCOPY;  Surgeon: Eldred Manges, MD;  Location: [facility];  Service: Gynecology;  Laterality: N/A;  . DILATION AND CURETTAGE OF UTERUS  1975  . [place]  . KNEE SURGERY  2000 2011   right knee replacement   . REPLACEMENT TOTAL KNEE  2000   left  . SHOULDER SURGERY  2004 2010   . TRANSTHORACIC ECHOCARDIOGRAM  07/2010   EF=>55%; LA mildly dilated; trace MR/TR;  . TUBAL LIGATION  1976    There were no vitals filed for this visit.  Subjective Assessment - 01/24/20 0935    Subjective  I am feeling better, My tingling and numbness is much better even at night. I have more mobility in my fingers and gripping.    Pertinent History  carpal tunnel, arthritis    Currently in Pain?  Yes    Multiple Pain Sites  No  [place] Adult PT Treatment/Exercise - 01/24/20 0001      Hand Exercises   Other Hand Exercises  small velcro: each finger extension 8x, then flexion 8x; VC to focus on fingers doing work and not the entire arm       Wrist Exercises   Wrist Flexion  Strengthening;Both;10 reps;Seated;Bar weights/barbell    Bar Weights/Barbell (Wrist Flexion)  1 lb    Wrist Extension  Strengthening;Both;10 reps;Seated;Bar weights/barbell    Bar Weights/Barbell (Wrist Extension)  1 lb    Other wrist exercises  Wrist extenor stretch 20 sec, flexor stretch 20 sec BIL   2 sets   Other wrist exercises  Thick velcro wrist rolls for flex/ext 5x bil : key grip 8x thick vlelcro       Moist Heat Therapy   Number Minutes Moist Heat  10 Minutes    Moist Heat Location  Hand;Wrist      Manual Therapy   Manual Therapy  Soft tissue mobilization;Joint mobilization;Passive ROM    Soft tissue  mobilization  LT > RT wrist extensors & flexors, Rt thenar eminence    Passive ROM  bil wrist flexion/extension, fBil fingers ( all joints)               PT Short Term Goals - 01/19/20 1002      PT SHORT TERM GOAL #1   Title  Pt will be independent with her initial HEP to increase ROM and strength of the hands.    Time  3    Period  Weeks    Status  Achieved    Target Date  01/31/20        PT Long Term Goals - 01/10/20 1212      PT LONG TERM GOAL #1   Title  Pt will have atleast 60 deg of Rt wrist flexion which will increase her independence with daily activity.    Time  6    Period  Weeks    Status  New    Target Date  02/23/20      PT LONG TERM GOAL #2   Title  Pt will have atleast 15lb improvement in Rt and Lt gross grip strength which will increase her ability to open jars independently.    Time  6    Period  Weeks    Status  New      PT LONG TERM GOAL #3   Title  Pt will have atleast 10lb improvement in pincer grip strength to assist with putting earrings on in the mornings.    Time  6    Period  Weeks    Status  New      PT LONG TERM GOAL #4   Title  Pt will have 4+/5 MMT of both wrists to improve independence with daily activity.    Time  6    Period  Weeks    Status  New      PT LONG TERM GOAL #5   Title  Pt will report atleast 50% improvement in wrist/hand strength and flexibility from the start of PT.    Time  6    Period  Weeks    Status  New            Plan - 01/24/20 0935    Clinical Impression Statement  Pt arrives pain free and demonstrates improved ability to flex her fingers and grip. Pt reports night time symptoms are lessening a bit, some nights more than others. Forearm soft  tissues remarkable. Pt tolerated increasing resistance for wrist and finger strength on the velcro board using the thicker strip for all her exercises. No pain or other symptoms during session.    Personal Factors and Comorbidities  Comorbidity 3+;Age;Time  since onset of injury/illness/exacerbation;Past/Current Experience    Comorbidities  carpal tunnel, trigger finger, OA    Examination-Activity Limitations  Dressing;Lift;Bathing;Carry    Examination-Participation Restrictions  Meal Prep;Cleaning    Stability/Clinical Decision Making  Evolving/Moderate complexity    Rehab Potential  Good    PT Frequency  2x / week    PT Duration  6 weeks    PT Treatment/Interventions  ADLs/Self Care Home Management;Cryotherapy;Moist Heat;Neuromuscular re-education;Therapeutic exercise;Therapeutic activities;Patient/family education;Manual techniques;Passive range of motion;Taping    PT Next Visit Plan  Continue to work with finger/hand/wrist strength and ROM  review goals next session    PT Home Exercise Plan  [id number]    Consulted and Agree with Plan of Care  Patient       Patient will benefit from skilled therapeutic intervention in order to improve the following deficits and impairments:  Impaired flexibility, Impaired UE functional use, Hypomobility, Decreased strength, Decreased range of motion, Pain  Visit Diagnosis: Stiffness of right hand, not elsewhere classified  Stiffness of left hand, not elsewhere classified  Muscle weakness (generalized)     Problem List Patient Active Problem List   Diagnosis Date Noted  . Kidney disease 09/15/2018  . Left ear impacted cerumen 02/01/2018  . Hyperlipidemia associated with type 2 diabetes mellitus ([place]) 11/04/2017  . Sensorineural hearing loss (SNHL) of both ears 01/19/2017  . Mild aortic stenosis 03/10/2016  . Hyperparathyroidism ([place]) 02/27/2016  . Chronic low back pain 02/27/2016  . Morbid obesity ([place]) 02/27/2016  . Chronic diastolic congestive heart failure ([place]) 10/28/2015  . Aortic valve sclerosis 08/15/2015  . Chronic kidney disease 05/24/2014  . Hypertension associated with diabetes ([place]) 08/21/2013  . Hyperlipidemia 08/21/2013  . PMB (postmenopausal bleeding) 05/15/2013  . Multiple  thyroid nodules 03/09/2013  . Unspecified vitamin D deficiency 03/09/2013  . Perimenopausal vasomotor symptoms 02/06/2010    Class: History of  . Osteoarthritis 02/06/2010  . Vaginal atrophy 01/12/2002    Class: History of  . Diabetes mellitus, type II ([place]) 09/07/2001    Class: History of    Dorinda Stehr, PTA 01/24/2020, 10:12 AM  [facility] [address] [place], [place], [address] Phone: [phone]   Fax:  [phone]  Name: Bonnie Chavez MRN: [id number] Date of Birth: 06/17/1934

## 2020-01-26 ENCOUNTER — Ambulatory Visit: Payer: Medicare Other | Admitting: Physical Therapy

## 2020-01-26 ENCOUNTER — Other Ambulatory Visit: Payer: Self-pay

## 2020-01-26 ENCOUNTER — Encounter: Payer: Self-pay | Admitting: Physical Therapy

## 2020-01-26 DIAGNOSIS — M6281 Muscle weakness (generalized): Secondary | ICD-10-CM

## 2020-01-26 DIAGNOSIS — M25641 Stiffness of right hand, not elsewhere classified: Secondary | ICD-10-CM | POA: Diagnosis not present

## 2020-01-26 DIAGNOSIS — M25642 Stiffness of left hand, not elsewhere classified: Secondary | ICD-10-CM

## 2020-01-26 NOTE — Therapy (Signed)
[facility] [address] [place], [place], [address] Phone: [phone]   Fax:  [phone]  Physical Therapy Treatment  Patient Details  Name: Bonnie Chavez MRN: [id number] Date of Birth: Feb 10, 1934 Referring Provider (PT): Grier Mitts, MD    Encounter Date: 01/26/2020  PT End of Session - 01/26/20 0938    Visit Number  5    Date for PT Re-Evaluation  02/23/20    Authorization Type  UHC    Authorization Time Period  01/10/20 to 02/23/20    Authorization - Visit Number  5    Authorization - Number of Visits  10    PT Start Time  0931    PT Stop Time  1018    PT Time Calculation (min)  47 min    Activity Tolerance  Patient tolerated treatment well    Behavior During Therapy  [facility] for tasks assessed/performed       Past Medical History:  Diagnosis Date  . Anemia    History of , resolved  . Aortic valve sclerosis   . Carpal tunnel syndrome   . Chicken pox   . Chronic low back pain 02/27/2016   -DDD -seeing [facility]   . Diabetes mellitus    type 2, sees Dr. Chalmers Cater  . Goiter    Dr. Chalmers Cater  . Heart murmur   . History of nuclear stress test 06/2010   dipyridamole; normal pattern of perfusion; ekg negative for ischemia; low risk scan   . Hot flashes   . Hyperlipidemia   . Hyperparathyroidism ([place]) 02/27/2016   -sees endocrinologist, Dr. Chalmers Cater, has seen surgeon as well   . Hypertension   . Migraine    no longer has migraines (80's)  . Mitral valve prolapse   . Neuropathy   . Obesity 02/27/2016  . Osteoarthritis    DDD, sees [place] [facility]  . PMB (postmenopausal bleeding) 02/28/2009   Resolved  . Pneumonia    history of   . Renal insufficiency    [name]   . Vaginal atrophy   . Vitamin D deficiency     Past Surgical History:  Procedure Laterality Date  . [place] &1976  . CHOLECYSTECTOMY  1991  . COLONOSCOPY    . DILATATION & CURRETTAGE/HYSTEROSCOPY WITH RESECTOCOPE N/A 05/15/2013    Procedure: [place];  Surgeon: Eldred Manges, MD;  Location: [facility];  Service: Gynecology;  Laterality: N/A;  . DILATATION & CURRETTAGE/HYSTEROSCOPY WITH RESECTOCOPE N/A 06/03/2017   Procedure: DILATATION & CURETTAGE/HYSTEROSCOPY;  Surgeon: Eldred Manges, MD;  Location: [facility];  Service: Gynecology;  Laterality: N/A;  . DILATION AND CURETTAGE OF UTERUS  1975  . [place]  . KNEE SURGERY  2000 2011   right knee replacement   . REPLACEMENT TOTAL KNEE  2000   left  . SHOULDER SURGERY  2004 2010   . TRANSTHORACIC ECHOCARDIOGRAM  07/2010   EF=>55%; LA mildly dilated; trace MR/TR;  . TUBAL LIGATION  1976    There were no vitals filed for this visit.  Subjective Assessment - 01/26/20 0939    Subjective  I feel almost 50% improved.    Pertinent History  carpal tunnel, arthritis    Diagnostic tests  Xray: OA 1st CMC joints bilaterally    Patient Stated Goals  be able to put earrings on    Currently in Pain?  No/denies   No current symtpoms   Multiple Pain Sites  No         [facility] PT Assessment - 01/26/20 0001      Strength   Right Hand Grip (lbs)  20    Left Hand Grip (lbs)  30                   [facility] Adult PT Treatment/Exercise - 01/26/20 0001      Wrist Exercises   Wrist Flexion  Strengthening;Both;20 reps;Seated;Bar weights/barbell    Bar Weights/Barbell (Wrist Flexion)  2 lbs    Wrist Extension  Strengthening;Both;20 reps;Seated;Bar weights/barbell    Bar Weights/Barbell (Wrist Extension)  2 lbs    Other wrist exercises  Thick velcro wrist rolls for flex/ext 5x bil : key grip 8x thick vlelcro       Moist Heat Therapy   Number Minutes Moist Heat  10 Minutes   PTA will begin working on on hand about 8 min into the heat   Moist Heat Location  Hand;Wrist      Manual Therapy   Manual Therapy  Soft tissue mobilization;Joint mobilization;Passive ROM    Soft tissue mobilization  LT > RT wrist  extensors & flexors, Rt thenar eminence    Passive ROM  bil wrist flexion/extension, fBil fingers ( all joints)               PT Short Term Goals - 01/19/20 1002      PT SHORT TERM GOAL #1   Title  Pt will be independent with her initial HEP to increase ROM and strength of the hands.    Time  3    Period  Weeks    Status  Achieved    Target Date  01/31/20        PT Long Term Goals - 01/26/20 1007      PT LONG TERM GOAL #2   Title  Pt will have atleast 15lb improvement in Rt and Lt gross grip strength which will increase her ability to open jars independently.    Time  6    Period  Weeks    Status  Partially Met   met on LT, progressing on the RT     PT LONG TERM GOAL #5   Title  Pt will report atleast 50% improvement in wrist/hand strength and flexibility from the start of PT.    Time  6    Period  Weeks    Status  Achieved   50% per report today           Plan - 01/26/20 1478    Clinical Impression Statement  Pt reporting 50% improvement at this time: pain & symptoms reduction//mobility of fingers & grip. Both grip strength measurements have improved, LT > RT. Pt was also able to tolerate an increase in her resistance for wrist strength. No pain during session today.    Personal Factors and Comorbidities  Comorbidity 3+;Age;Time since onset of injury/illness/exacerbation;Past/Current Experience    Comorbidities  carpal tunnel, trigger finger, OA    Examination-Activity Limitations  Dressing;Lift;Bathing;Carry    Examination-Participation Restrictions  Meal Prep;Cleaning    Rehab Potential  Good    PT Frequency  2x / week    PT Duration  6 weeks    PT Treatment/Interventions  ADLs/Self Care Home Management;Cryotherapy;Moist Heat;Neuromuscular re-education;Therapeutic exercise;Therapeutic activities;Patient/family education;Manual techniques;Passive range of motion;Taping    PT Next Visit Plan  Continue to work with finger/hand/wrist strength and ROM next 1-2  sessions.    PT Home Exercise Plan  [id number]  Consulted and Agree with Plan of Care  Patient       Patient will benefit from skilled therapeutic intervention in order to improve the following deficits and impairments:  Impaired flexibility, Impaired UE functional use, Hypomobility, Decreased strength, Decreased range of motion, Pain  Visit Diagnosis: Stiffness of right hand, not elsewhere classified  Stiffness of left hand, not elsewhere classified  Muscle weakness (generalized)     Problem List Patient Active Problem List   Diagnosis Date Noted  . Kidney disease 09/15/2018  . Left ear impacted cerumen 02/01/2018  . Hyperlipidemia associated with type 2 diabetes mellitus ([place]) 11/04/2017  . Sensorineural hearing loss (SNHL) of both ears 01/19/2017  . Mild aortic stenosis 03/10/2016  . Hyperparathyroidism ([place]) 02/27/2016  . Chronic low back pain 02/27/2016  . Morbid obesity ([place]) 02/27/2016  . Chronic diastolic congestive heart failure ([place]) 10/28/2015  . Aortic valve sclerosis 08/15/2015  . Chronic kidney disease 05/24/2014  . Hypertension associated with diabetes ([place]) 08/21/2013  . Hyperlipidemia 08/21/2013  . PMB (postmenopausal bleeding) 05/15/2013  . Multiple thyroid nodules 03/09/2013  . Unspecified vitamin D deficiency 03/09/2013  . Perimenopausal vasomotor symptoms 02/06/2010    Class: History of  . Osteoarthritis 02/06/2010  . Vaginal atrophy 01/12/2002    Class: History of  . Diabetes mellitus, type II ([place]) 09/07/2001    Class: History of    Donnovan Stamour, PTA 01/26/2020, 10:21 AM  [facility] [address], [place], [place], [address] Phone: [phone]   Fax:  [phone]  Name: Georgeana Oertel MRN: [id number] Date of Birth: May 21, 1934

## 2020-01-29 LAB — HM DIABETES EYE EXAM

## 2020-01-31 ENCOUNTER — Encounter: Payer: Self-pay | Admitting: Physical Therapy

## 2020-01-31 ENCOUNTER — Ambulatory Visit: Payer: Medicare Other | Admitting: Physical Therapy

## 2020-01-31 ENCOUNTER — Other Ambulatory Visit: Payer: Self-pay

## 2020-01-31 DIAGNOSIS — M6281 Muscle weakness (generalized): Secondary | ICD-10-CM

## 2020-01-31 DIAGNOSIS — M25641 Stiffness of right hand, not elsewhere classified: Secondary | ICD-10-CM | POA: Diagnosis not present

## 2020-01-31 DIAGNOSIS — M25642 Stiffness of left hand, not elsewhere classified: Secondary | ICD-10-CM

## 2020-01-31 NOTE — Therapy (Signed)
[facility] [address] [place], [place], [address] Phone: [phone]   Fax:  [phone]  Physical Therapy Treatment  Patient Details  Name: Bonnie Chavez MRN: [id number] Date of Birth: 05-03-1934 Referring Provider (PT): Grier Mitts, MD    Encounter Date: 01/31/2020  PT End of Session - 01/31/20 0940    Visit Number  6    Date for PT Re-Evaluation  02/23/20    Authorization Type  UHC    Authorization Time Period  01/10/20 to 02/23/20    Authorization - Visit Number  6    Authorization - Number of Visits  10    PT Start Time  0930    PT Stop Time  1015    PT Time Calculation (min)  45 min    Activity Tolerance  Patient tolerated treatment well    Behavior During Therapy  [facility] for tasks assessed/performed       Past Medical History:  Diagnosis Date  . Anemia    History of , resolved  . Aortic valve sclerosis   . Carpal tunnel syndrome   . Chicken pox   . Chronic low back pain 02/27/2016   -DDD -seeing [facility]   . Diabetes mellitus    type 2, sees Dr. Chalmers Cater  . Goiter    Dr. Chalmers Cater  . Heart murmur   . History of nuclear stress test 06/2010   dipyridamole; normal pattern of perfusion; ekg negative for ischemia; low risk scan   . Hot flashes   . Hyperlipidemia   . Hyperparathyroidism ([place]) 02/27/2016   -sees endocrinologist, Dr. Chalmers Cater, has seen surgeon as well   . Hypertension   . Migraine    no longer has migraines (80's)  . Mitral valve prolapse   . Neuropathy   . Obesity 02/27/2016  . Osteoarthritis    DDD, sees [facility]  . PMB (postmenopausal bleeding) 02/28/2009   Resolved  . Pneumonia    history of   . Renal insufficiency    [name]   . Vaginal atrophy   . Vitamin D deficiency     Past Surgical History:  Procedure Laterality Date  . [place] &1976  . CHOLECYSTECTOMY  1991  . COLONOSCOPY    . DILATATION & CURRETTAGE/HYSTEROSCOPY WITH RESECTOCOPE N/A  05/15/2013   Procedure: [place];  Surgeon: Eldred Manges, MD;  Location: [facility];  Service: Gynecology;  Laterality: N/A;  . DILATATION & CURRETTAGE/HYSTEROSCOPY WITH RESECTOCOPE N/A 06/03/2017   Procedure: DILATATION & CURETTAGE/HYSTEROSCOPY;  Surgeon: Eldred Manges, MD;  Location: [facility];  Service: Gynecology;  Laterality: N/A;  . DILATION AND CURETTAGE OF UTERUS  1975  . [place]  . KNEE SURGERY  2000 2011   right knee replacement   . REPLACEMENT TOTAL KNEE  2000   left  . SHOULDER SURGERY  2004 2010   . TRANSTHORACIC ECHOCARDIOGRAM  07/2010   EF=>55%; LA mildly dilated; trace MR/TR;  . TUBAL LIGATION  1976    There were no vitals filed for this visit.  Subjective Assessment - 01/31/20 0942    Subjective  I have some tingling in my fingers today.    Pertinent History  carpal tunnel, arthritis    Currently in Pain?  No/denies    Multiple Pain Sites  No                       [facility] Adult  PT Treatment/Exercise - 01/31/20 0001      Therapeutic Activites    Therapeutic Activities  Other Therapeutic Activities    Other Therapeutic Activities  putting on earrings using mirror for visual aide      Hand Exercises   Digiticizer  Red 1 min 2x       Wrist Exercises   Wrist Flexion  Strengthening;Both;Seated;Bar weights/barbell   2x15   Bar Weights/Barbell (Wrist Flexion)  2 lbs    Wrist Extension  Strengthening;Both;20 reps;Seated;Bar weights/barbell   2x15   Bar Weights/Barbell (Wrist Extension)  2 lbs    Other wrist exercises  Thick velcro wrist rolls for flex/ext 8x bil : key grip 8x thick vlelcro       Moist Heat Therapy   Number Minutes Moist Heat  8 Minutes   PTA begins working on one hand while the other stays inheat     Manual Therapy   Passive ROM  bil wrist flexion/extension, fBil fingers ( all joints)   Static stretching for Bil wrist flexion & extension 3x 20 se               PT Short Term Goals - 01/19/20 1002      PT SHORT TERM GOAL #1   Title  Pt will be independent with her initial HEP to increase ROM and strength of the hands.    Time  3    Period  Weeks    Status  Achieved    Target Date  01/31/20        PT Long Term Goals - 01/26/20 1007      PT LONG TERM GOAL #2   Title  Pt will have atleast 15lb improvement in Rt and Lt gross grip strength which will increase her ability to open jars independently.    Time  6    Period  Weeks    Status  Partially Met   met on LT, progressing on the RT     PT LONG TERM GOAL #5   Title  Pt will report atleast 50% improvement in wrist/hand strength and flexibility from the start of PT.    Time  6    Period  Weeks    Status  Achieved   50% per report today           Plan - 01/31/20 0941    Clinical Impression Statement  Pt arrives with tingling in her fingers. Tingling was abolished by end of session. Pt able to tolerate increased reps wiht 2# on wrist flex/ext strength exercises. pt brought some hoop earrings today to practice putting them in, she was successful with the Rt but could not put in the LT side.    Personal Factors and Comorbidities  Comorbidity 3+;Age;Time since onset of injury/illness/exacerbation;Past/Current Experience    Comorbidities  carpal tunnel, trigger finger, OA    Examination-Activity Limitations  Dressing;Lift;Bathing;Carry    Examination-Participation Restrictions  Meal Prep;Cleaning    Stability/Clinical Decision Making  Evolving/Moderate complexity    Rehab Potential  Good    PT Frequency  2x / week    PT Duration  6 weeks    PT Treatment/Interventions  ADLs/Self Care Home Management;Cryotherapy;Moist Heat;Neuromuscular re-education;Therapeutic exercise;Therapeutic activities;Patient/family education;Manual techniques;Passive range of motion;Taping    PT Next Visit Plan  Continue to work with finger/hand/wrist strength and ROM next 1-2 sessions.    PT  Home Exercise Plan  [id number]    Consulted and Agree with Plan of Care  --  Patient will benefit from skilled therapeutic intervention in order to improve the following deficits and impairments:  Impaired flexibility, Impaired UE functional use, Hypomobility, Decreased strength, Decreased range of motion, Pain  Visit Diagnosis: Stiffness of right hand, not elsewhere classified  Stiffness of left hand, not elsewhere classified  Muscle weakness (generalized)     Problem List Patient Active Problem List   Diagnosis Date Noted  . Kidney disease 09/15/2018  . Left ear impacted cerumen 02/01/2018  . Hyperlipidemia associated with type 2 diabetes mellitus ([place]) 11/04/2017  . Sensorineural hearing loss (SNHL) of both ears 01/19/2017  . Mild aortic stenosis 03/10/2016  . Hyperparathyroidism ([place]) 02/27/2016  . Chronic low back pain 02/27/2016  . Morbid obesity ([place]) 02/27/2016  . Chronic diastolic congestive heart failure ([place]) 10/28/2015  . Aortic valve sclerosis 08/15/2015  . Chronic kidney disease 05/24/2014  . Hypertension associated with diabetes ([place]) 08/21/2013  . Hyperlipidemia 08/21/2013  . PMB (postmenopausal bleeding) 05/15/2013  . Multiple thyroid nodules 03/09/2013  . Unspecified vitamin D deficiency 03/09/2013  . Perimenopausal vasomotor symptoms 02/06/2010    Class: History of  . Osteoarthritis 02/06/2010  . Vaginal atrophy 01/12/2002    Class: History of  . Diabetes mellitus, type II ([place]) 09/07/2001    Class: History of    Advait Buice, PTA 01/31/2020, 4:10 PM  [facility] [address], [place], [place], [address] Phone: [phone]   Fax:  [phone]  Name: Bonnie Chavez MRN: [id number] Date of Birth: Oct 05, 1934

## 2020-02-01 ENCOUNTER — Encounter: Payer: Self-pay | Admitting: Family Medicine

## 2020-02-02 ENCOUNTER — Other Ambulatory Visit: Payer: Self-pay

## 2020-02-02 ENCOUNTER — Ambulatory Visit: Payer: Medicare Other | Admitting: Physical Therapy

## 2020-02-02 ENCOUNTER — Encounter: Payer: Self-pay | Admitting: Physical Therapy

## 2020-02-02 DIAGNOSIS — M25641 Stiffness of right hand, not elsewhere classified: Secondary | ICD-10-CM

## 2020-02-02 DIAGNOSIS — M25642 Stiffness of left hand, not elsewhere classified: Secondary | ICD-10-CM

## 2020-02-02 DIAGNOSIS — M6281 Muscle weakness (generalized): Secondary | ICD-10-CM

## 2020-02-02 NOTE — Therapy (Signed)
[facility] [address], [place], [place], [address] Phone: [phone]   Fax:  [phone]  Physical Therapy Treatment  Patient Details  Name: Bonnie Chavez MRN: [id number] Date of Birth: 1934/02/02 Referring Provider (PT): Grier Mitts, MD    Encounter Date: 02/02/2020  PT End of Session - 02/02/20 1000    Visit Number  7    Date for PT Re-Evaluation  02/23/20    Authorization Type  UHC    Authorization Time Period  01/10/20 to 02/23/20    Authorization - Visit Number  7    Authorization - Number of Visits  10    PT Start Time  1000    PT Stop Time  1045    PT Time Calculation (min)  45 min    Activity Tolerance  Patient tolerated treatment well    Behavior During Therapy  [facility] for tasks assessed/performed       Past Medical History:  Diagnosis Date  . Anemia    History of , resolved  . Aortic valve sclerosis   . Carpal tunnel syndrome   . Chicken pox   . Chronic low back pain 02/27/2016   -DDD -seeing [facility]   . Diabetes mellitus    type 2, sees Dr. Chalmers Cater  . Goiter    Dr. Chalmers Cater  . Heart murmur   . History of nuclear stress test 06/2010   dipyridamole; normal pattern of perfusion; ekg negative for ischemia; low risk scan   . Hot flashes   . Hyperlipidemia   . Hyperparathyroidism ([place]) 02/27/2016   -sees endocrinologist, Dr. Chalmers Cater, has seen surgeon as well   . Hypertension   . Migraine    no longer has migraines (80's)  . Mitral valve prolapse   . Neuropathy   . Obesity 02/27/2016  . Osteoarthritis    DDD, sees [facility]  . PMB (postmenopausal bleeding) 02/28/2009   Resolved  . Pneumonia    history of   . Renal insufficiency    [name]   . Vaginal atrophy   . Vitamin D deficiency     Past Surgical History:  Procedure Laterality Date  . [place] &1976  . CHOLECYSTECTOMY  1991  . COLONOSCOPY    . DILATATION & CURRETTAGE/HYSTEROSCOPY WITH RESECTOCOPE N/A  05/15/2013   Procedure: [place];  Surgeon: Eldred Manges, MD;  Location: [facility];  Service: Gynecology;  Laterality: N/A;  . DILATATION & CURRETTAGE/HYSTEROSCOPY WITH RESECTOCOPE N/A 06/03/2017   Procedure: DILATATION & CURETTAGE/HYSTEROSCOPY;  Surgeon: Eldred Manges, MD;  Location: [facility];  Service: Gynecology;  Laterality: N/A;  . DILATION AND CURETTAGE OF UTERUS  1975  . [place]  . KNEE SURGERY  2000 2011   right knee replacement   . REPLACEMENT TOTAL KNEE  2000   left  . SHOULDER SURGERY  2004 2010   . TRANSTHORACIC ECHOCARDIOGRAM  07/2010   EF=>55%; LA mildly dilated; trace MR/TR;  . TUBAL LIGATION  1976    There were no vitals filed for this visit.  Subjective Assessment - 02/02/20 1001    Subjective  Intermitttent tingling in my three fingers. My pain at night is  60% less.    Pertinent History  carpal tunnel, arthritis    Currently in Pain?  No/denies   First three fingers have numbness and tingling right now   Pain Descriptors / Indicators  Tingling;Numbness    Multiple Pain Sites  No         [facility] PT Assessment - 02/02/20 0001      Strength   Right Hand Lateral Pinch  8 lbs    Left Hand Lateral Pinch  8 lbs                   [facility] Adult PT Treatment/Exercise - 02/02/20 0001      Shoulder Exercises: ROM/Strengthening   UBE (Upper Arm Bike)  L1.5 2x2 at end of session PTA present to monitor      Wrist Exercises   Wrist Flexion  Strengthening;Both;Seated;Bar weights/barbell   2x15   Bar Weights/Barbell (Wrist Flexion)  2 lbs    Wrist Extension  Strengthening;Both;20 reps;Seated;Bar weights/barbell   2x15   Bar Weights/Barbell (Wrist Extension)  2 lbs    Other wrist exercises  Thick velcro wrist rolls for flex/ext 10x bil : key grip 10x thick velcro, individual finger extension 10x thick velcro       Moist Heat Therapy   Number Minutes Moist Heat  8 Minutes   PTA begins to work on  one hand while other stays in heat   Moist Heat Location  Hand;Wrist      Manual Therapy   Passive ROM  bil wrist flexion/extension, fBil fingers ( all joints)   Static stretching for Bil wrist flexion & extension 3x 20 se              PT Short Term Goals - 01/19/20 1002      PT SHORT TERM GOAL #1   Title  Pt will be independent with her initial HEP to increase ROM and strength of the hands.    Time  3    Period  Weeks    Status  Achieved    Target Date  01/31/20        PT Long Term Goals - 01/26/20 1007      PT LONG TERM GOAL #2   Title  Pt will have atleast 15lb improvement in Rt and Lt gross grip strength which will increase her ability to open jars independently.    Time  6    Period  Weeks    Status  Partially Met   met on LT, progressing on the RT     PT LONG TERM GOAL #5   Title  Pt will report atleast 50% improvement in wrist/hand strength and flexibility from the start of PT.    Time  6    Period  Weeks    Status  Achieved   50% per report today           Plan - 02/02/20 1009    Clinical Impression Statement  Night time pain 60% less, both general grip and pincer grip improved. Pt had no adverse reactions with exercises today. She did require occaasional verbal cuing with individual finger extension to not compensate with her shoulder.    Personal Factors and Comorbidities  Comorbidity 3+;Age;Time since onset of injury/illness/exacerbation;Past/Current Experience    Comorbidities  carpal tunnel, trigger finger, OA    Examination-Activity Limitations  Dressing;Lift;Bathing;Carry    Examination-Participation Restrictions  Meal Prep;Cleaning    Stability/Clinical Decision Making  Evolving/Moderate complexity    Rehab Potential  Good    PT Frequency  2x / week    PT Duration  6 weeks    PT Treatment/Interventions  ADLs/Self Care Home Management;Cryotherapy;Moist Heat;Neuromuscular re-education;Therapeutic exercise;Therapeutic  activities;Patient/family education;Manual techniques;Passive range of motion;Taping    PT Next Visit Plan  MMT and ROM for goals next session    PT Home Exercise Plan  [id number]    Consulted and Agree with Plan of Care  Patient       Patient will benefit from skilled therapeutic intervention in order to improve the following deficits and impairments:  Impaired flexibility, Impaired UE functional use, Hypomobility, Decreased strength, Decreased range of motion, Pain  Visit Diagnosis: Stiffness of right hand, not elsewhere classified  Stiffness of left hand, not elsewhere classified  Muscle weakness (generalized)     Problem List Patient Active Problem List   Diagnosis Date Noted  . Kidney disease 09/15/2018  . Left ear impacted cerumen 02/01/2018  . Hyperlipidemia associated with type 2 diabetes mellitus ([place]) 11/04/2017  . Sensorineural hearing loss (SNHL) of both ears 01/19/2017  . Mild aortic stenosis 03/10/2016  . Hyperparathyroidism ([place]) 02/27/2016  . Chronic low back pain 02/27/2016  . Morbid obesity ([place]) 02/27/2016  . Chronic diastolic congestive heart failure ([place]) 10/28/2015  . Aortic valve sclerosis 08/15/2015  . Chronic kidney disease 05/24/2014  . Hypertension associated with diabetes ([place]) 08/21/2013  . Hyperlipidemia 08/21/2013  . PMB (postmenopausal bleeding) 05/15/2013  . Multiple thyroid nodules 03/09/2013  . Unspecified vitamin D deficiency 03/09/2013  . Perimenopausal vasomotor symptoms 02/06/2010    Class: History of  . Osteoarthritis 02/06/2010  . Vaginal atrophy 01/12/2002    Class: History of  . Diabetes mellitus, type II ([place]) 09/07/2001    Class: History of    COCHRAN,JENNIFER, PTA 02/02/2020, 10:47 AM  [facility] [address], [place], [place], [address] Phone: [phone]   Fax:  [phone]  Name: Tomika Eckles MRN: [id number] Date of Birth: 1934-03-02

## 2020-02-07 ENCOUNTER — Encounter: Payer: Self-pay | Admitting: Physical Therapy

## 2020-02-07 ENCOUNTER — Other Ambulatory Visit: Payer: Self-pay

## 2020-02-07 ENCOUNTER — Ambulatory Visit: Payer: Medicare Other | Admitting: Physical Therapy

## 2020-02-07 DIAGNOSIS — M6281 Muscle weakness (generalized): Secondary | ICD-10-CM

## 2020-02-07 DIAGNOSIS — M25641 Stiffness of right hand, not elsewhere classified: Secondary | ICD-10-CM | POA: Diagnosis not present

## 2020-02-07 DIAGNOSIS — M25642 Stiffness of left hand, not elsewhere classified: Secondary | ICD-10-CM

## 2020-02-07 NOTE — Therapy (Signed)
[facility] [address] [place], [place], [address] Phone: [phone]   Fax:  [phone]  Physical Therapy Treatment  Patient Details  Name: Bonnie Chavez MRN: [id number] Date of Birth: Nov 08, 1934 Referring Provider (PT): Grier Mitts, MD    Encounter Date: 02/07/2020  PT End of Session - 02/07/20 1022    Visit Number  8    Date for PT Re-Evaluation  02/23/20    Authorization Type  UHC    Authorization Time Period  01/10/20 to 02/23/20    Authorization - Visit Number  8    Authorization - Number of Visits  10    PT Start Time  3888    PT Stop Time  1103    PT Time Calculation (min)  45 min    Activity Tolerance  Patient tolerated treatment well    Behavior During Therapy  [facility] for tasks assessed/performed       Past Medical History:  Diagnosis Date  . Anemia    History of , resolved  . Aortic valve sclerosis   . Carpal tunnel syndrome   . Chicken pox   . Chronic low back pain 02/27/2016   -DDD -seeing [place] [facility]   . Diabetes mellitus    type 2, sees Dr. Chalmers Cater  . Goiter    Dr. Chalmers Cater  . Heart murmur   . History of nuclear stress test 06/2010   dipyridamole; normal pattern of perfusion; ekg negative for ischemia; low risk scan   . Hot flashes   . Hyperlipidemia   . Hyperparathyroidism ([place]) 02/27/2016   -sees endocrinologist, Dr. Chalmers Cater, has seen surgeon as well   . Hypertension   . Migraine    no longer has migraines (80's)  . Mitral valve prolapse   . Neuropathy   . Obesity 02/27/2016  . Osteoarthritis    DDD, sees [place] [facility]  . PMB (postmenopausal bleeding) 02/28/2009   Resolved  . Pneumonia    history of   . Renal insufficiency    [name]   . Vaginal atrophy   . Vitamin D deficiency     Past Surgical History:  Procedure Laterality Date  . [place] &1976  . CHOLECYSTECTOMY  1991  . COLONOSCOPY    . DILATATION & CURRETTAGE/HYSTEROSCOPY WITH RESECTOCOPE N/A  05/15/2013   Procedure: [place];  Surgeon: Eldred Manges, MD;  Location: [facility];  Service: Gynecology;  Laterality: N/A;  . DILATATION & CURRETTAGE/HYSTEROSCOPY WITH RESECTOCOPE N/A 06/03/2017   Procedure: DILATATION & CURETTAGE/HYSTEROSCOPY;  Surgeon: Eldred Manges, MD;  Location: [facility];  Service: Gynecology;  Laterality: N/A;  . DILATION AND CURETTAGE OF UTERUS  1975  . [place]  . KNEE SURGERY  2000 2011   right knee replacement   . REPLACEMENT TOTAL KNEE  2000   left  . SHOULDER SURGERY  2004 2010   . TRANSTHORACIC ECHOCARDIOGRAM  07/2010   EF=>55%; LA mildly dilated; trace MR/TR;  . TUBAL LIGATION  1976    There were no vitals filed for this visit.  Subjective Assessment - 02/07/20 1020    Subjective  Pt reports 75% reduction in pain and improved mobility with PT.  Ongoing first three digits numbness/tingling intermittently.  Saturday had some pain in Lt thumb.    Pertinent History  carpal tunnel, arthritis    Diagnostic tests  Xray: OA 1st CMC joints bilaterally    Patient Stated Goals  be able to  put earrings on    Currently in Pain?  No/denies                       [facility] Adult PT Treatment/Exercise - 02/07/20 0001      Shoulder Exercises: ROM/Strengthening   UBE (Upper Arm Bike)  L1.5 2x2, PT present to review goals, beginning of ession    Other ROM/Strengthening Exercises  2x10 reps grip strength at MCPs and thumb opposition with red flex ring      Wrist Exercises   Wrist Flexion  Strengthening;Both;Seated;Bar weights/barbell   2x15   Bar Weights/Barbell (Wrist Flexion)  2 lbs    Wrist Extension  Strengthening;Both;20 reps;Seated;Bar weights/barbell   2x15   Bar Weights/Barbell (Wrist Extension)  2 lbs    Other wrist exercises  prayer stretch 1x30 sec for wrist ext     Other wrist exercises  Thick velcro wrist rolls for flex/ext 10x bil : key grip 10x thick velcro, individual finger  extension 10x thick velcro       Moist Heat Therapy   Number Minutes Moist Heat  5 Minutes    Moist Heat Location  Hand;Wrist   concurrent with manual therapy     Manual Therapy   Joint Mobilization  bil thumb distraction at [facility] and MCP    Soft tissue mobilization  thenar eminence on Lt               PT Short Term Goals - 01/19/20 1002      PT SHORT TERM GOAL #1   Title  Pt will be independent with her initial HEP to increase ROM and strength of the hands.    Time  3    Period  Weeks    Status  Achieved    Target Date  01/31/20        PT Long Term Goals - 02/07/20 1024      PT LONG TERM GOAL #1   Title  Pt will have atleast 60 deg of Rt wrist flexion which will increase her independence with daily activity.    Baseline  Lt wrist 70, Rt wrist 60    Status  Achieved      PT LONG TERM GOAL #2   Title  Pt will have atleast 15lb improvement in Rt and Lt gross grip strength which will increase her ability to open jars independently.    Baseline  Lt 30#, Rt 25#    Status  Partially Met      PT LONG TERM GOAL #3   Title  Pt will have atleast 10lb improvement in pincer grip strength to assist with putting earrings on in the mornings.    Baseline  Rt 8#, Lt 10#    Status  Partially Met      PT LONG TERM GOAL #4   Title  Pt will have 4+/5 MMT of both wrists to improve independence with daily activity.    Status  Achieved      PT LONG TERM GOAL #5   Title  Pt will report atleast 50% improvement in wrist/hand strength and flexibility from the start of PT.    Baseline  75%    Status  Achieved            Plan - 02/07/20 1047    Clinical Impression Statement  Pt reports 75% improvement in pain and mobility of bil wrists, thumbs and hands with PT.  She is very pleased with progress.  Met LTG  for wrist ROM today and has made progress on pincer and grip strength but is still shy of fully meeting these goals.  Pain is localized over Lt>Rt [facility] and MCP joints which PT  worked on for joint and soft tissue mobility today with good relief.  Pt doing well with ther ex and demos improved performance with less need for cueing for joint protection alignment.  Pt feels ready for d/c after next appointment.    Rehab Potential  Good    PT Frequency  2x / week    PT Duration  6 weeks    PT Treatment/Interventions  ADLs/Self Care Home Management;Cryotherapy;Moist Heat;Neuromuscular re-education;Therapeutic exercise;Therapeutic activities;Patient/family education;Manual techniques;Passive range of motion;Taping    PT Next Visit Plan  UBE, heat, manual therapy for hands/thumbs, finalize/review HEP, d/c next visit    PT Home Exercise Plan  [id number]    Consulted and Agree with Plan of Care  Patient       Patient will benefit from skilled therapeutic intervention in order to improve the following deficits and impairments:     Visit Diagnosis: Stiffness of right hand, not elsewhere classified  Stiffness of left hand, not elsewhere classified  Muscle weakness (generalized)     Problem List Patient Active Problem List   Diagnosis Date Noted  . Kidney disease 09/15/2018  . Left ear impacted cerumen 02/01/2018  . Hyperlipidemia associated with type 2 diabetes mellitus ([place]) 11/04/2017  . Sensorineural hearing loss (SNHL) of both ears 01/19/2017  . Mild aortic stenosis 03/10/2016  . Hyperparathyroidism ([place]) 02/27/2016  . Chronic low back pain 02/27/2016  . Morbid obesity ([place]) 02/27/2016  . Chronic diastolic congestive heart failure ([place]) 10/28/2015  . Aortic valve sclerosis 08/15/2015  . Chronic kidney disease 05/24/2014  . Hypertension associated with diabetes ([place]) 08/21/2013  . Hyperlipidemia 08/21/2013  . PMB (postmenopausal bleeding) 05/15/2013  . Multiple thyroid nodules 03/09/2013  . Unspecified vitamin D deficiency 03/09/2013  . Perimenopausal vasomotor symptoms 02/06/2010    Class: History of  . Osteoarthritis 02/06/2010  . Vaginal atrophy  01/12/2002    Class: History of  . Diabetes mellitus, type II ([place]) 09/07/2001    Class: History of   Baruch Merl, PT 02/07/20 11:09 AM    [facility] [address], [place], [place], [address] Phone: [phone]   Fax:  [phone]  Name: Bonnie Chavez MRN: [id number] Date of Birth: 05-09-1934

## 2020-02-09 ENCOUNTER — Encounter: Payer: Self-pay | Admitting: Physical Therapy

## 2020-02-09 ENCOUNTER — Other Ambulatory Visit: Payer: Self-pay

## 2020-02-09 ENCOUNTER — Ambulatory Visit: Payer: Medicare Other | Admitting: Physical Therapy

## 2020-02-09 DIAGNOSIS — M25641 Stiffness of right hand, not elsewhere classified: Secondary | ICD-10-CM

## 2020-02-09 DIAGNOSIS — M6281 Muscle weakness (generalized): Secondary | ICD-10-CM

## 2020-02-09 DIAGNOSIS — M25642 Stiffness of left hand, not elsewhere classified: Secondary | ICD-10-CM

## 2020-02-09 NOTE — Therapy (Signed)
[facility] [address] [place], [place], [address] Phone: [phone]   Fax:  [phone]  Physical Therapy Treatment  Patient Details  Name: Bonnie Chavez MRN: [id number] Date of Birth: [birth year] Referring Provider (PT): Grier Mitts, MD    Encounter Date: 02/09/2020  PT End of Session - 02/09/20 0938    Visit Number  9    Date for PT Re-Evaluation  02/23/20    Authorization Type  UHC    Authorization Time Period  01/10/20 to 02/23/20    PT Start Time  0930    PT Stop Time  1015    PT Time Calculation (min)  45 min       Past Medical History:  Diagnosis Date  . Anemia    History of , resolved  . Aortic valve sclerosis   . Carpal tunnel syndrome   . Chicken pox   . Chronic low back pain 02/27/2016   -DDD -seeing [place] [facility]   . Diabetes mellitus    type 2, sees Dr. Chalmers Cater  . Goiter    Dr. Chalmers Cater  . Heart murmur   . History of nuclear stress test 06/2010   dipyridamole; normal pattern of perfusion; ekg negative for ischemia; low risk scan   . Hot flashes   . Hyperlipidemia   . Hyperparathyroidism ([place]) 02/27/2016   -sees endocrinologist, Dr. Chalmers Cater, has seen surgeon as well   . Hypertension   . Migraine    no longer has migraines (80's)  . Mitral valve prolapse   . Neuropathy   . Obesity 02/27/2016  . Osteoarthritis    DDD, sees [place] [facility]  . PMB (postmenopausal bleeding) 02/28/2009   Resolved  . Pneumonia    history of   . Renal insufficiency    [name]   . Vaginal atrophy   . Vitamin D deficiency     Past Surgical History:  Procedure Laterality Date  . [place] &1976  . CHOLECYSTECTOMY  1991  . COLONOSCOPY    . DILATATION & CURRETTAGE/HYSTEROSCOPY WITH RESECTOCOPE N/A 05/15/2013   Procedure: [place];  Surgeon: Eldred Manges, MD;  Location: [facility];  Service: Gynecology;  Laterality: N/A;  . DILATATION &  CURRETTAGE/HYSTEROSCOPY WITH RESECTOCOPE N/A 06/03/2017   Procedure: DILATATION & CURETTAGE/HYSTEROSCOPY;  Surgeon: Eldred Manges, MD;  Location: [facility];  Service: Gynecology;  Laterality: N/A;  . DILATION AND CURETTAGE OF UTERUS  1975  . [place]  . KNEE SURGERY  2000 2011   right knee replacement   . REPLACEMENT TOTAL KNEE  2000   left  . SHOULDER SURGERY  2004 2010   . TRANSTHORACIC ECHOCARDIOGRAM  07/2010   EF=>55%; LA mildly dilated; trace MR/TR;  . TUBAL LIGATION  1976    There were no vitals filed for this visit.  Subjective Assessment - 02/09/20 0936    Subjective  75% improvement in pain and mobility with PT.  Mostly pain in Lt thumb especially in night and early AM.    Pertinent History  carpal tunnel, arthritis    Diagnostic tests  Xray: OA 1st CMC joints bilaterally    Patient Stated Goals  be able to put earrings on    Currently in Pain?  Yes    Pain Score  2     Pain Location  Other (Comment)   thumb   Pain Orientation  Left    Pain Descriptors / Indicators  Aching  Pain Type  Chronic pain    Pain Onset  More than a month ago    Pain Frequency  Intermittent                       [facility] Adult PT Treatment/Exercise - 02/09/20 0001      Exercises   Exercises  Wrist;Hand      Shoulder Exercises: ROM/Strengthening   UBE (Upper Arm Bike)  L1.5 2x2, PT present to finalize HEP    Other ROM/Strengthening Exercises  2x10 reps grip strength at MCPs and thumb opposition with red flex ring    Other ROM/Strengthening Exercises  1.4kg digiflex bil x 10 reps, info given on where to access this for home use      Hand Exercises   Thumb Opposition  A/ROM to each finger bil      Wrist Exercises   Other wrist exercises  Thick velcro wrist rolls for flex/ext 10x bil : key grip 10x thick velcro, individual finger extension 10x thick velcro       Moist Heat Therapy   Number Minutes Moist Heat  5 Minutes    Moist Heat Location  Hand;Wrist    bil     Manual Therapy   Joint Mobilization  bil thumb distraction at [facility] and MCP bil, mobs for ext Gr II/III    Soft tissue mobilization  thenar eminence bil    Passive ROM  wrist extension/flexion               PT Short Term Goals - 01/19/20 1002      PT SHORT TERM GOAL #1   Title  Pt will be independent with her initial HEP to increase ROM and strength of the hands.    Time  3    Period  Weeks    Status  Achieved    Target Date  01/31/20        PT Long Term Goals - 02/07/20 1024      PT LONG TERM GOAL #1   Title  Pt will have atleast 60 deg of Rt wrist flexion which will increase her independence with daily activity.    Baseline  Lt wrist 70, Rt wrist 60    Status  Achieved      PT LONG TERM GOAL #2   Title  Pt will have atleast 15lb improvement in Rt and Lt gross grip strength which will increase her ability to open jars independently.    Baseline  Lt 30#, Rt 25#    Status  Partially Met      PT LONG TERM GOAL #3   Title  Pt will have atleast 10lb improvement in pincer grip strength to assist with putting earrings on in the mornings.    Baseline  Rt 8#, Lt 10#    Status  Partially Met      PT LONG TERM GOAL #4   Title  Pt will have 4+/5 MMT of both wrists to improve independence with daily activity.    Status  Achieved      PT LONG TERM GOAL #5   Title  Pt will report atleast 50% improvement in wrist/hand strength and flexibility from the start of PT.    Baseline  75%    Status  Achieved            Plan - 02/09/20 1006    Clinical Impression Statement  Pt reports 75% improvement in pain and mobility of bil wrists, thumbs  and hands with PT.  She has ongoing nighttime sleep disturbance from pain in Lt thumb but this is more localized pain than previous diffuse bil hand pain.  There is less angular deformity of trigger fingers and bil thumb CMC and MCP joints from initial evaluation.  Pt has met or partially met all LTGs with imrpoved wrist exension  and strength throughout grip and hands/wrists bil.  She is independent with her HEP and is ready for d/c to HEP with follow up as needed.    Personal Factors and Comorbidities  Comorbidity 3+;Age;Time since onset of injury/illness/exacerbation;Past/Current Experience    Comorbidities  carpal tunnel, trigger finger, OA    Examination-Participation Restrictions  Meal Prep;Cleaning    Rehab Potential  Good    PT Frequency  2x / week    PT Duration  6 weeks    PT Treatment/Interventions  ADLs/Self Care Home Management;Cryotherapy;Moist Heat;Neuromuscular re-education;Therapeutic exercise;Therapeutic activities;Patient/family education;Manual techniques;Passive range of motion;Taping    PT Next Visit Plan  d/c to HEP with f/u as needed    PT Home Exercise Plan  [id number]    Consulted and Agree with Plan of Care  Patient       Patient will benefit from skilled therapeutic intervention in order to improve the following deficits and impairments:  Impaired flexibility, Impaired UE functional use, Hypomobility, Decreased strength, Decreased range of motion, Pain  Visit Diagnosis: Stiffness of right hand, not elsewhere classified  Stiffness of left hand, not elsewhere classified  Muscle weakness (generalized)     Problem List Patient Active Problem List   Diagnosis Date Noted  . Kidney disease 09/15/2018  . Left ear impacted cerumen 02/01/2018  . Hyperlipidemia associated with type 2 diabetes mellitus ([place]) 11/04/2017  . Sensorineural hearing loss (SNHL) of both ears 01/19/2017  . Mild aortic stenosis 03/10/2016  . Hyperparathyroidism ([place]) 02/27/2016  . Chronic low back pain 02/27/2016  . Morbid obesity ([place]) 02/27/2016  . Chronic diastolic congestive heart failure ([place]) 10/28/2015  . Aortic valve sclerosis 08/15/2015  . Chronic kidney disease 05/24/2014  . Hypertension associated with diabetes ([place]) 08/21/2013  . Hyperlipidemia 08/21/2013  . PMB (postmenopausal bleeding) 05/15/2013   . Multiple thyroid nodules 03/09/2013  . Unspecified vitamin D deficiency 03/09/2013  . Perimenopausal vasomotor symptoms 02/06/2010    Class: History of  . Osteoarthritis 02/06/2010  . Vaginal atrophy 01/12/2002    Class: History of  . Diabetes mellitus, type II ([place]) 09/07/2001    Class: History of    PHYSICAL THERAPY DISCHARGE SUMMARY  Visits from Start of Care: 9  Current functional level related to goals / functional outcomes: See above   Remaining deficits: See above   Education / Equipment: HEP Plan: Patient agrees to discharge.  Patient goals were partially met. Patient is being discharged due to being pleased with the current functional level.  ?????         Baruch Merl, PT 02/09/20 10:13 AM   [facility] [address], [place], [place], [address] Phone: [phone]   Fax:  [phone]  Name: Bonnie Chavez MRN: [id number] Date of Birth: May 13, 1934

## 2020-03-10 ENCOUNTER — Other Ambulatory Visit: Payer: Self-pay | Admitting: Cardiovascular Disease

## 2020-04-30 ENCOUNTER — Other Ambulatory Visit: Payer: Self-pay

## 2020-05-01 ENCOUNTER — Ambulatory Visit (INDEPENDENT_AMBULATORY_CARE_PROVIDER_SITE_OTHER): Payer: Medicare Other | Admitting: Family Medicine

## 2020-05-01 ENCOUNTER — Encounter: Payer: Self-pay | Admitting: Family Medicine

## 2020-05-01 VITALS — BP 136/80 | HR 53 | Temp 98.0°F | Wt 194.2 lb

## 2020-05-01 DIAGNOSIS — N1832 Chronic kidney disease, stage 3b: Secondary | ICD-10-CM | POA: Diagnosis not present

## 2020-05-01 DIAGNOSIS — I10 Essential (primary) hypertension: Secondary | ICD-10-CM | POA: Diagnosis not present

## 2020-05-01 DIAGNOSIS — D631 Anemia in chronic kidney disease: Secondary | ICD-10-CM | POA: Diagnosis not present

## 2020-05-01 DIAGNOSIS — E1121 Type 2 diabetes mellitus with diabetic nephropathy: Secondary | ICD-10-CM

## 2020-05-01 DIAGNOSIS — Z794 Long term (current) use of insulin: Secondary | ICD-10-CM

## 2020-05-01 NOTE — Progress Notes (Signed)
Subjective:    Patient ID: Bonnie Chavez, female    DOB: 01/07/1934, 86 y.o.   MRN: [id number]  Chief Complaint  Patient presents with  . Follow-up    HPI Patient was seen today for f/u.  DM controlled.  Seen by [name].  Taking Humulin 12 units at night.  Seen by [name] for renal insufficiency.  Followed by cardiology.  Also seen by [name] for hyperparathyroidism. Pt has a copy of labs from 03/19/20.  Pt had 2 doses of Pfizer COVID vaccine on 02/05/20 and 02/26/20.  Pt in PT for muscle atrophy in b/l hands and arthritis.  Doing exercises regularly.  Notes improvement.  Past Medical History:  Diagnosis Date  . Anemia    History of , resolved  . Aortic valve sclerosis   . Carpal tunnel syndrome   . Chicken pox   . Chronic low back pain 02/27/2016   -DDD -seeing [place] [facility]   . Diabetes mellitus    type 2, sees Dr. Chalmers Cater  . Goiter    Dr. Chalmers Cater  . Heart murmur   . History of nuclear stress test 06/2010   dipyridamole; normal pattern of perfusion; ekg negative for ischemia; low risk scan   . Hot flashes   . Hyperlipidemia   . Hyperparathyroidism ([place]) 02/27/2016   -sees endocrinologist, Dr. Chalmers Cater, has seen surgeon as well   . Hypertension   . Migraine    no longer has migraines (80's)  . Mitral valve prolapse   . Neuropathy   . Obesity 02/27/2016  . Osteoarthritis    DDD, sees [place] [facility]  . PMB (postmenopausal bleeding) 02/28/2009   Resolved  . Pneumonia    history of   . Renal insufficiency    [name]   . Vaginal atrophy   . Vitamin D deficiency     Allergies  Allergen Reactions  . Contrast Media [Iodinated Diagnostic Agents] Other (See Comments)    Reaction unknown  . Nsaids Other (See Comments)    Abnormal renal function  . Sensipar [Cinacalcet Hcl]     Pt stated, "Made me feel bloated, gassy and fluid buildup"  . Penicillins Rash and Other (See Comments)    Pt has taken Keflex without difficulty Has patient had a PCN reaction causing immediate  rash, facial/tongue/throat swelling, SOB or lightheadedness with hypotension: yes Has patient had a PCN reaction causing severe rash involving mucus membranes or skin necrosis: no Has patient had a PCN reaction that required hospitalization no Has patient had a PCN reaction occurring within the last 10 years: no If all of the above answers are "NO", then may proceed with Cephalosporin use.    ROS General: Denies fever, chills, night sweats, changes in weight, changes in appetite HEENT: Denies headaches, ear pain, changes in vision, rhinorrhea, sore throat CV: Denies CP, palpitations, SOB, orthopnea Pulm: Denies SOB, cough, wheezing GI: Denies abdominal pain, nausea, vomiting, diarrhea, constipation GU: Denies dysuria, hematuria, frequency, vaginal discharge Msk: Denies muscle cramps, joint pains Neuro: Denies weakness, numbness, tingling Skin: Denies rashes, bruising Psych: Denies depression, anxiety, hallucinations      Objective:    Blood pressure 136/80, pulse (!) 53, temperature 98 F (36.7 C), temperature source Temporal, weight 194 lb 3.2 oz (88.1 kg), SpO2 98 %.  Gen. Pleasant, well-nourished, in no distress, normal affect   HEENT: [place]/AT, face symmetric, no scleral icterus, PERRLA, EOMI, nares patent without drainage Lungs: no accessory muscle use, CTAB, no wheezes or rales Cardiovascular: RRR, no m/r/g,  no peripheral edema Musculoskeletal: No deformities, no cyanosis or clubbing, normal tone Neuro:  A&Ox3, CN II-XII intact, normal gait Skin:  Warm, no lesions/ rash  Diabetic Foot Exam - Simple   Simple Foot Form Diabetic Foot exam was performed with the following findings: Yes 05/01/2020  9:32 AM  Visual Inspection No deformities, no ulcerations, no other skin breakdown bilaterally: Yes Sensation Testing Intact to touch and monofilament testing bilaterally: Yes Pulse Check Posterior Tibialis and Dorsalis pulse intact bilaterally: Yes Comments     Wt Readings from  Last 3 Encounters:  12/27/19 195 lb (88.5 kg)  11/29/19 195 lb (88.5 kg)  08/23/19 197 lb (89.4 kg)    Lab Results  Component Value Date   WBC 7.8 12/27/2019   HGB 12.2 12/27/2019   HCT 37.0 12/27/2019   PLT 200.0 12/27/2019   GLUCOSE 138 (H) 11/04/2017   CHOL 168 11/04/2017   TRIG 90.0 11/04/2017   HDL 55.50 11/04/2017   LDLCALC 95 11/04/2017   ALT 15 05/24/2014   AST 25 05/24/2014   NA 142 01/11/2018   K 4.8 01/11/2018   CL 103 11/04/2017   CREATININE 1.7 (A) 01/11/2018   BUN 43 (A) 01/11/2018   CO2 30 11/04/2017   TSH 2.00 01/30/2016   INR 1.62 (H) 10/30/2010   HGBA1C 7.8 (H) 12/27/2019   MICROALBUR 0.9 05/24/2014    Assessment/Plan:  Essential hypertension -controlled -continue low sodium diet -continue current meds:  bystolic 5 mg BID, lasix 20 mg, lisinopril 10 mg  Type 2 diabetes mellitus with stage 3b chronic kidney disease, with long-term current use of insulin (HCC) -stable -hgb A1C 7.8% on 12/27/19.   -discussed lifestyle modifications -continue humulin n 12 u qhs, gabapentin 100 mg qhs, sensipar, and vitamin D -continue f/u with Nephrology, [name] and Endo, Dr. Suzette Battiest  Anemia due to stage 3b chronic kidney disease -labs 03/19/20 reviewed: TIBC 327, Fe2+ 70, Iron Sat 21, Ferritin 301 -hgb improved 12.1 -continue sensipar -continue f/u with Nephrology  F/u in 3-4 months sooner if needed.  Grier Mitts, MD

## 2020-05-01 NOTE — Patient Instructions (Addendum)
Chronic Kidney Disease, Adult Chronic kidney disease (CKD) occurs when the kidneys become damaged slowly over a long period of time. The kidneys are a pair of organs that do many important jobs in the body, including:  Removing waste and extra fluid from the blood to make urine.  Making hormones that maintain the amount of fluid in tissues and blood vessels.  Maintaining the right amount of fluids and chemicals in the body. A small amount of kidney damage may not cause problems, but a large amount of damage may make it hard or impossible for the kidneys to work the way they should. If steps are not taken to slow down kidney damage or to stop it from getting worse, the kidneys may stop working permanently (end-stage renal disease or ESRD). Most of the time, CKD does not go away, but it can often be controlled. People who have CKD are usually able to live normal lives. What are the causes? The most common causes of this condition are diabetes and high blood pressure (hypertension). Other causes include:  Heart and blood vessel (cardiovascular) disease.  Kidney diseases, such as: ? Glomerulonephritis. ? Interstitial nephritis. ? Polycystic kidney disease. ? Renal vascular disease.  Diseases that affect the immune system.  Genetic diseases.  Medicines that damage the kidneys, such as anti-inflammatory medicines.  Being around or being in contact with poisonous (toxic) substances.  A kidney or urinary infection that occurs again and again (recurs).  Vasculitis. This is swelling or inflammation of the blood vessels.  A problem with urine flow that may be caused by: ? Cancer. ? Having kidney stones more than one time. ? An enlarged prostate, in males. What increases the risk? You are more likely to develop this condition if you:  Are older than age 44.  Are female.  Are African-American, Hispanic, Asian, [place], or American [place].  Are a current or former  smoker.  Are obese.  Have a family history of kidney disease or failure.  Often take medicines that are damaging to the kidneys. What are the signs or symptoms? Symptoms of this condition include:  Swelling (edema) of the face, legs, ankles, or feet.  Tiredness (lethargy) and having less energy.  Nausea or vomiting.  Confusion or trouble concentrating.  Problems with urination, such as: ? Painful or burning feeling during urination. ? Decreased urine production. ? Frequent urination, especially at night. ? Bloody urine.  Muscle twitches and cramps, especially in the legs.  Shortness of breath.  Weakness.  Loss of appetite.  Metallic taste in the mouth.  Trouble sleeping.  Dry, itchy skin.  A low blood count (anemia).  Pale lining of the eyelids and surface of the eye (conjunctiva). Symptoms develop slowly and may not be obvious until the kidney damage becomes severe. It is possible to have kidney disease for years without having any symptoms. How is this diagnosed? This condition may be diagnosed based on:  Blood tests.  Urine tests.  Imaging tests, such as an ultrasound or CT scan.  A test in which a sample of tissue is removed from the kidneys to be examined under a microscope (kidney biopsy). These test results will help your health care provider determine how serious the CKD is. How is this treated? There is no cure for most cases of this condition, but treatment usually relieves symptoms and prevents or slows the progression of the disease. Treatment may include:  Making diet changes, which may require you to avoid alcohol, salty foods (sodium),  and foods that are high in potassium, calcium, and protein.  Medicines: ? To lower blood pressure. ? To control blood glucose. ? To relieve anemia. ? To relieve swelling. ? To protect your bones. ? To improve the balance of electrolytes in your blood.  Removing toxic waste from the body through types of  dialysis, if the kidneys can no longer do their job (kidney failure).  Managing any other conditions that are causing your CKD or making it worse. Follow these instructions at home: Medicines  Take over-the-counter and prescription medicines only as told by your health care provider. The dose of some medicines that you take may need to be adjusted.  Do not take any new medicines unless approved by your health care provider. Many medicines can worsen your kidney damage.  Do not take any vitamin and mineral supplements unless approved by your health care provider. Many nutritional supplements can worsen your kidney damage. General instructions  Follow your prescribed diet as told by your health care provider.  Do not use any products that contain nicotine or tobacco, such as cigarettes and e-cigarettes. If you need help quitting, ask your health care provider.  Monitor and track your blood pressure at home. Report changes in your blood pressure as told by your health care provider.  If you are being treated for diabetes, monitor and track your blood sugar (blood glucose) levels as told by your health care provider.  Maintain a healthy weight. If you need help with this, ask your health care provider.  Start or continue an exercise plan. Exercise at least 30 minutes a day, 5 days a week.  Keep your immunizations up to date as told by your health care provider.  Keep all follow-up visits as told by your health care provider. This is important. Where to find more information  American Association of Kidney Patients: BombTimer.gl  National Kidney Foundation: www.kidney.[place]: https://mathis.com/  [facility]: www.lifeoptions.org and www.kidneyschool.org Contact a health care provider if:  Your symptoms get worse.  You develop new symptoms. Get help right away if:  You develop symptoms of ESRD, which include: ? Headaches. ? Numbness in the  hands or feet. ? Easy bruising. ? Frequent hiccups. ? Chest pain. ? Shortness of breath. ? Lack of menstruation, in women.  You have a fever.  You have decreased urine production.  You have pain or bleeding when you urinate. Summary  Chronic kidney disease (CKD) occurs when the kidneys become damaged slowly over a long period of time.  The most common causes of this condition are diabetes and high blood pressure (hypertension).  There is no cure for most cases of this condition, but treatment usually relieves symptoms and prevents or slows the progression of the disease. Treatment may include a combination of medicines and lifestyle changes. This information is not intended to replace advice given to you by your health care provider. Make sure you discuss any questions you have with your health care provider. Document Revised: 10/22/2017 Document Reviewed: 12/17/2016 Elsevier Patient Education  [place].  Diabetes Mellitus and Nutrition, Adult When you have diabetes (diabetes mellitus), it is very important to have healthy eating habits because your blood sugar (glucose) levels are greatly affected by what you eat and drink. Eating healthy foods in the appropriate amounts, at about the same times every day, can help you:  Control your blood glucose.  Lower your risk of heart disease.  Improve your blood pressure.  Reach  or maintain a healthy weight. Every person with diabetes is different, and each person has different needs for a meal plan. Your health care provider may recommend that you work with a diet and nutrition specialist (dietitian) to make a meal plan that is best for you. Your meal plan may vary depending on factors such as:  The calories you need.  The medicines you take.  Your weight.  Your blood glucose, blood pressure, and cholesterol levels.  Your activity level.  Other health conditions you have, such as heart or kidney disease. How do  carbohydrates affect me? Carbohydrates, also called carbs, affect your blood glucose level more than any other type of food. Eating carbs naturally raises the amount of glucose in your blood. Carb counting is a method for keeping track of how many carbs you eat. Counting carbs is important to keep your blood glucose at a healthy level, especially if you use insulin or take certain oral diabetes medicines. It is important to know how many carbs you can safely have in each meal. This is different for every person. Your dietitian can help you calculate how many carbs you should have at each meal and for each snack. Foods that contain carbs include:  Bread, cereal, rice, pasta, and crackers.  Potatoes and corn.  Peas, beans, and lentils.  Milk and yogurt.  Fruit and juice.  Desserts, such as cakes, cookies, ice cream, and candy. How does alcohol affect me? Alcohol can cause a sudden decrease in blood glucose (hypoglycemia), especially if you use insulin or take certain oral diabetes medicines. Hypoglycemia can be a life-threatening condition. Symptoms of hypoglycemia (sleepiness, dizziness, and confusion) are similar to symptoms of having too much alcohol. If your health care provider says that alcohol is safe for you, follow these guidelines:  Limit alcohol intake to no more than 1 drink per day for nonpregnant women and 2 drinks per day for men. One drink equals 12 oz of beer, 5 oz of wine, or 1 oz of hard liquor.  Do not drink on an empty stomach.  Keep yourself hydrated with water, diet soda, or unsweetened iced tea.  Keep in mind that regular soda, juice, and other mixers may contain a lot of sugar and must be counted as carbs. What are tips for following this plan?  Reading food labels  Start by checking the serving size on the "Nutrition Facts" label of packaged foods and drinks. The amount of calories, carbs, fats, and other nutrients listed on the label is based on one serving of  the item. Many items contain more than one serving per package.  Check the total grams (g) of carbs in one serving. You can calculate the number of servings of carbs in one serving by dividing the total carbs by 15. For example, if a food has 30 g of total carbs, it would be equal to 2 servings of carbs.  Check the number of grams (g) of saturated and trans fats in one serving. Choose foods that have low or no amount of these fats.  Check the number of milligrams (mg) of salt (sodium) in one serving. Most people should limit total sodium intake to less than 2,300 mg per day.  Always check the nutrition information of foods labeled as "low-fat" or "nonfat". These foods may be higher in added sugar or refined carbs and should be avoided.  Talk to your dietitian to identify your daily goals for nutrients listed on the label. Shopping  Avoid buying  canned, premade, or processed foods. These foods tend to be high in fat, sodium, and added sugar.  Shop around the outside edge of the grocery store. This includes fresh fruits and vegetables, bulk grains, fresh meats, and fresh dairy. Cooking  Use low-heat cooking methods, such as baking, instead of high-heat cooking methods like deep frying.  Cook using healthy oils, such as olive, canola, or sunflower oil.  Avoid cooking with butter, cream, or high-fat meats. Meal planning  Eat meals and snacks regularly, preferably at the same times every day. Avoid going long periods of time without eating.  Eat foods high in fiber, such as fresh fruits, vegetables, beans, and whole grains. Talk to your dietitian about how many servings of carbs you can eat at each meal.  Eat 4-6 ounces (oz) of lean protein each day, such as lean meat, chicken, fish, eggs, or tofu. One oz of lean protein is equal to: ? 1 oz of meat, chicken, or fish. ? 1 egg. ?  cup of tofu.  Eat some foods each day that contain healthy fats, such as avocado, nuts, seeds, and  fish. Lifestyle  Check your blood glucose regularly.  Exercise regularly as told by your health care provider. This may include: ? 150 minutes of moderate-intensity or vigorous-intensity exercise each week. This could be brisk walking, biking, or water aerobics. ? Stretching and doing strength exercises, such as yoga or weightlifting, at least 2 times a week.  Take medicines as told by your health care provider.  Do not use any products that contain nicotine or tobacco, such as cigarettes and e-cigarettes. If you need help quitting, ask your health care provider.  Work with a Social worker or diabetes educator to identify strategies to manage stress and any emotional and social challenges. Questions to ask a health care provider  Do I need to meet with a diabetes educator?  Do I need to meet with a dietitian?  What number can I call if I have questions?  When are the best times to check my blood glucose? Where to find more information:  American Diabetes Association: diabetes.org  Academy of Nutrition and Dietetics: www.eatright.[employer] of Diabetes and Digestive and Kidney Diseases (NIH): [employer] Summary  A healthy meal plan will help you control your blood glucose and maintain a healthy lifestyle.  Working with a diet and nutrition specialist (dietitian) can help you make a meal plan that is best for you.  Keep in mind that carbohydrates (carbs) and alcohol have immediate effects on your blood glucose levels. It is important to count carbs and to use alcohol carefully. This information is not intended to replace advice given to you by your health care provider. Make sure you discuss any questions you have with your health care provider. Document Revised: 10/22/2017 Document Reviewed: 12/14/2016 Elsevier Patient Education  [place].  Peripheral Edema  Peripheral edema is swelling that is caused by a buildup of fluid. Peripheral edema most  often affects the lower legs, ankles, and feet. It can also develop in the arms, hands, and face. The area of the body that has peripheral edema will look swollen. It may also feel heavy or warm. Your clothes may start to feel tight. Pressing on the area may make a temporary dent in your skin. You may not be able to move your swollen arm or leg as much as usual. There are many causes of peripheral edema. It can happen because of a complication of other conditions such  as congestive heart failure, kidney disease, or a problem with your blood circulation. It also can be a side effect of certain medicines or because of an infection. It often happens to women during pregnancy. Sometimes, the cause is not known. Follow these instructions at home: Managing pain, stiffness, and swelling   Raise (elevate) your legs while you are sitting or lying down.  Move around often to prevent stiffness and to lessen swelling.  Do not sit or stand for long periods of time.  Wear support stockings as told by your health care provider. Medicines  Take over-the-counter and prescription medicines only as told by your health care provider.  Your health care provider may prescribe medicine to help your body get rid of excess water (diuretic). General instructions  Pay attention to any changes in your symptoms.  Follow instructions from your health care provider about limiting salt (sodium) in your diet. Sometimes, eating less salt may reduce swelling.  Moisturize skin daily to help prevent skin from cracking and draining.  Keep all follow-up visits as told by your health care provider. This is important. Contact a health care provider if you have:  A fever.  Edema that starts suddenly or is getting worse, especially if you are pregnant or have a medical condition.  Swelling in only one leg.  Increased swelling, redness, or pain in one or both of your legs.  Drainage or sores at the area where you have  edema. Get help right away if you:  Develop shortness of breath, especially when you are lying down.  Have pain in your chest or abdomen.  Feel weak.  Feel faint. Summary  Peripheral edema is swelling that is caused by a buildup of fluid. Peripheral edema most often affects the lower legs, ankles, and feet.  Move around often to prevent stiffness and to lessen swelling. Do not sit or stand for long periods of time.  Pay attention to any changes in your symptoms.  Contact a health care provider if you have edema that starts suddenly or is getting worse, especially if you are pregnant or have a medical condition.  Get help right away if you develop shortness of breath, especially when lying down. This information is not intended to replace advice given to you by your health care provider. Make sure you discuss any questions you have with your health care provider. Document Revised: 08/03/2018 Document Reviewed: 08/03/2018 Elsevier Patient Education  [place].

## 2020-05-23 ENCOUNTER — Telehealth (INDEPENDENT_AMBULATORY_CARE_PROVIDER_SITE_OTHER): Payer: Medicare Other | Admitting: Family Medicine

## 2020-05-23 ENCOUNTER — Other Ambulatory Visit: Payer: Self-pay

## 2020-05-23 ENCOUNTER — Encounter: Payer: Self-pay | Admitting: Family Medicine

## 2020-05-23 DIAGNOSIS — R059 Cough, unspecified: Secondary | ICD-10-CM

## 2020-05-23 DIAGNOSIS — Z7189 Other specified counseling: Secondary | ICD-10-CM

## 2020-05-23 DIAGNOSIS — J029 Acute pharyngitis, unspecified: Secondary | ICD-10-CM | POA: Diagnosis not present

## 2020-05-23 DIAGNOSIS — J3489 Other specified disorders of nose and nasal sinuses: Secondary | ICD-10-CM | POA: Diagnosis not present

## 2020-05-23 DIAGNOSIS — J040 Acute laryngitis: Secondary | ICD-10-CM

## 2020-05-23 DIAGNOSIS — R05 Cough: Secondary | ICD-10-CM

## 2020-05-23 MED ORDER — AZITHROMYCIN 250 MG PO TABS: ORAL_TABLET | ORAL | 0 refills | Status: DC

## 2020-05-23 MED ORDER — BENZONATATE 100 MG PO CAPS: 100.0000 mg | ORAL_CAPSULE | Freq: Two times a day (BID) | ORAL | 0 refills | Status: DC | PRN

## 2020-05-23 NOTE — Progress Notes (Signed)
Virtual Visit via Telephone Note  I connected with Bonnie Chavez on 05/23/20 at  8:30 AM EDT by telephone and verified that I am speaking with the correct person using two identifiers.   I discussed the limitations, risks, security and privacy concerns of performing an evaluation and management service by telephone and the availability of in person appointments. I also discussed with the patient that there may be a patient responsible charge related to this service. The patient expressed understanding and agreed to proceed.  Location patient: home Location provider: work or home office Participants present for the call: patient, provider Patient did not have a visit in the prior 7 days to address this/these issue(s).   History of Present Illness: Pt is an 86 yo with pmh sig for chronic diastolic CHF, HTN, DM Monday night pt developed dry cough, rhinorrhea, felt like her sinuses were draining.  Pt developed a sore throat and felt like "it was going into my chest".  Taken cough syrup, gargled with salt water, and tea with honey and lemon.  Pt notes the back of her throat looks bad, like little pimples back there and some white on the back of her tongue.  Pt denies ear pain or pressure, HAs, n/v, sick contacts, or tonsils touching.  Pt concerned she is developing bronchitis as this feels similar to previous episodes.   Observations/Objective: Patient sounds sick but not toxic, voice very hoarse going out at times on the phone. I do not appreciate any SOB. Speech and thought processing are grossly intact. Patient reported vitals: 98.90F  Assessment and Plan: Laryngitis -Supportive care including gargling with warm salt water or Chloraseptic spray. -Avoid excess talking and straining voice -Given precautions. -Patient to notify clinic tomorrow for continued or worsening symptoms  Sore throat  -concern for strep pharyngitis -allergies to PCN.  Tolerated Azithromycin in the past per pt. -  Plan: azithromycin (ZITHROMAX) 250 MG tablet  Rhinorrhea -Likely 2/2 URI versus allergies -Continue supportive care  Educated about COVID-19 virus infection -Discussed signs and symptoms of Covid 19 virus infection -Patient will vaccinated however discussed breakthrough cases -Patient encouraged to consider Covid testing for continued or worsening symptoms  Cough  -Likely 2/2 postnasal drainage.  Also consider viral UTI - Plan: benzonatate (TESSALON) 100 MG capsule    Follow Up Instructions:  F/u prn in the next few days  I did not refer this patient for an OV in the next 24 hours for this/these issue(s).  I discussed the assessment and treatment plan with the patient. The patient was provided an opportunity to ask questions and all were answered. The patient agreed with the plan and demonstrated an understanding of the instructions.   The patient was advised to call back or seek an in-person evaluation if the symptoms worsen or if the condition fails to improve as anticipated.  I provided 12 minutes of non-face-to-face time during this encounter.   Billie Ruddy, MD

## 2020-05-24 ENCOUNTER — Telehealth: Payer: Self-pay | Admitting: Family Medicine

## 2020-05-24 NOTE — Telephone Encounter (Signed)
Pt calling with an update. Pt says, she still has a sore throat and still has a hard time swallowing but feels better. Thanks

## 2020-05-28 NOTE — Telephone Encounter (Signed)
Spoke with pt state that she feels somewhat better, state that the Tessalon pearls are not helping the cough and the mucus in her throat, pt requests for medication that will help break down the mucus in her throat, please advise

## 2020-06-20 ENCOUNTER — Telehealth: Payer: Self-pay | Admitting: Family Medicine

## 2020-06-20 NOTE — Telephone Encounter (Signed)
Left message for patient to schedule Annual Wellness Visit.  Please schedule with Nurse Health Advisor Shannon Crews, RN at [facility]  

## 2020-07-15 ENCOUNTER — Encounter: Payer: Self-pay | Admitting: Cardiovascular Disease

## 2020-07-15 ENCOUNTER — Other Ambulatory Visit: Payer: Self-pay

## 2020-07-15 ENCOUNTER — Ambulatory Visit: Payer: Medicare Other | Admitting: Cardiovascular Disease

## 2020-07-15 VITALS — BP 122/62 | HR 60 | Ht 66.0 in | Wt 189.8 lb

## 2020-07-15 DIAGNOSIS — I350 Nonrheumatic aortic (valve) stenosis: Secondary | ICD-10-CM | POA: Diagnosis not present

## 2020-07-15 DIAGNOSIS — R809 Proteinuria, unspecified: Secondary | ICD-10-CM

## 2020-07-15 DIAGNOSIS — N1832 Chronic kidney disease, stage 3b: Secondary | ICD-10-CM

## 2020-07-15 DIAGNOSIS — I440 Atrioventricular block, first degree: Secondary | ICD-10-CM

## 2020-07-15 DIAGNOSIS — E1129 Type 2 diabetes mellitus with other diabetic kidney complication: Secondary | ICD-10-CM

## 2020-07-15 DIAGNOSIS — E785 Hyperlipidemia, unspecified: Secondary | ICD-10-CM | POA: Diagnosis not present

## 2020-07-15 DIAGNOSIS — Z794 Long term (current) use of insulin: Secondary | ICD-10-CM

## 2020-07-15 DIAGNOSIS — I10 Essential (primary) hypertension: Secondary | ICD-10-CM

## 2020-07-15 NOTE — Progress Notes (Signed)
Patient ID: Bonnie Chavez, female   DOB: [birth year], 86 y.o.   MRN: [id number]      HPI: Bonnie Chavez is a 86 y.o. female presents to the office today for a 7 month cardiology evaluation.  Bonnie Chavez has a history of hypertension with documented grade 1 diastolic dysfunction, hyperlipidemia, type 2 diabetes mellitus with renal insufficiency, as well as aortic valve sclerosis without stenosis. An echo Doppler study in 2011 showed an EF of 55%.  In 2011 a nuclear perfusion scan showed normal perfusion and function.  She has a history of a goiter for which she sees Dr. Cleon Gustin, [name] for her renal insufficiency, Dr. Pennie Rushing for uterine polyps and fibroids.  She has a history of hyperparathyroidism and saw Dr. Georgana Curio; she has not had surgery in his undergoing continued surveillance of her calcium levels.  She underwent a five-year follow-up echo Doppler study on 08/23/2015.  This continued to show normal systolic function with mild LVH and grade 1 diastolic dysfunction.  There was a 10 mm mean gradient and 22 mm peak gradient across her moderately calcified aortic valve with a valve area of 1.63 cm.    She has a long-standing history of diabetes mellitus  and sees Dr. Cleon Gustin.  She sees Dr. Darrick Penna for renal insufficiency.  I reviewed her recent blood work from his office 03/09/2017 which showed her creatinine had improved to 1.5.  In March 2018 Cr was 1.63 and in December 2017 Cr was 1.83.    When I saw her in May 2018, she had had subsequent blood work which had revealed improvement in creatinine to 1.5.  I recommended she undergo a 2 year follow-up echo Doppler evaluation of her aortic stenosis.  This was done on 08/10/2007 and showed an EF of 65-70%.  Her mean aortic gradient has increased to 16 and her peak gradient 30.  There was grade 1 diastolic dysfunction, mild MR, mild LA dilation and mild TR.  Her estimated aortic valve area is 1.58 cm.  She denied any chest pain, presyncope or syncope.     I saw her in October 2018.  She has continued to be followed by Dr. Romero Belling of endocrinology and Dr. Darrick Penna of nephrology.  She has renal insufficiency and her creatinine in August 2018 was 1.7.  Her blood pressure was stable and I recommended reduction of her furosemide to 20 mg from her prior dose of 40 mg.  Any chest pain.  She denies presyncope or syncope.  She tells me her recent hemoglobin A1c was 7.6.  She is  followed by Dr. Cleon Gustin of endocrinology and Dr. Darrick Penna of nephrology. A repeat creatinine in August 2018 was 1.7.    I last saw her in the office for evaluation in November 2019.  At that time she denied any chest pain, shortness of breath, presyncope or syncope.  She underwent a repeat echo Doppler study on September 02, 2018 which continued to show normal systolic function with an EF of 55 to 60%, grade 1 diastolic dysfunction, moderate aortic stenosis with a mean gradient of 23 and peak gradient of 37 and mitral annular calcification.  Laboratory from Dr. Darrick Penna in October 2019 revealed a creatinine of 1.64.    She had a telemedicine evaluation with Dr. Kriste Basque, her primary physician in March 2020 and saw Dr. Darrick Penna  and had repeat renal function testing.    I  evaluated her on May 12, 2019 in a telemedicine visit. She had remained asymptomatic and specifically  denied any chest pain PND orthopnea, presyncope or syncope.  She denies any palpitations.  At times she notes trivial left ankle edema.    She underwent an echo Doppler study on November 06, 2019 which continued to show an EF of 60 to 65%.  There were no regional wall motion abnormalities.  There was moderate LVH and asymmetric left ventricular hypertrophy.  There was moderate aortic stenosis with a mean gradient of 19 and peak gradient of 36.2.  There was mild PR, trivial MR and TR. I last saw her on November 29, 2019.  At that time she felt well and admitted to just mild episodes of shortness of breath particularly  with walking uphill.  She has had issues with arthritis.  She has continued to see [name] for endocrinologic care and now sees [name] for nephrology since Dr. [name] is about to retire.   She underwent recent laboratory on June 24, 2020 by [name] which showed a creatinine of 1.42.  She had normal LFTs.  TSH was 1.34.  Total cholesterol 160, triglycerides 74, HDL 48, LDL cholesterol 98.  Laboratory by [name] has shown mild vitamin D insufficiency with a vitamin D level of 23 in April 2021.  Creatinine at that time was further increased at 1.74 which subsequently improved.  She denies any chest tightness.  She denies any dizziness presyncope or syncope.  She can continues to experience some mild shortness of breath with uphill walking with this without change.  She presents for evaluation.     Past Medical History:  Diagnosis Date  . Anemia    History of , resolved  . Aortic valve sclerosis   . Carpal tunnel syndrome   . Chicken pox   . Chronic low back pain 02/27/2016   -DDD -seeing [facility]   . Diabetes mellitus    type 2, sees Dr. Chalmers Cater  . Goiter    Dr. Chalmers Cater  . Heart murmur   . History of nuclear stress test 06/2010   dipyridamole; normal pattern of perfusion; ekg negative for ischemia; low risk scan   . Hot flashes   . Hyperlipidemia   . Hyperparathyroidism ([place]) 02/27/2016   -sees endocrinologist, Dr. Chalmers Cater, has seen surgeon as well   . Hypertension   . Migraine    no longer has migraines (80's)  . Mitral valve prolapse   . Neuropathy   . Obesity 02/27/2016  . Osteoarthritis    DDD, sees [facility]  . PMB (postmenopausal bleeding) 02/28/2009   Resolved  . Pneumonia    history of   . Renal insufficiency    [name]   . Vaginal atrophy   . Vitamin D deficiency     Past Surgical History:  Procedure Laterality Date  . [place] &1976  . CHOLECYSTECTOMY  1991  . COLONOSCOPY    . DILATATION & CURRETTAGE/HYSTEROSCOPY WITH  RESECTOCOPE N/A 05/15/2013   Procedure: [place];  Surgeon: Eldred Manges, MD;  Location: [facility];  Service: Gynecology;  Laterality: N/A;  . DILATATION & CURRETTAGE/HYSTEROSCOPY WITH RESECTOCOPE N/A 06/03/2017   Procedure: DILATATION & CURETTAGE/HYSTEROSCOPY;  Surgeon: Eldred Manges, MD;  Location: [facility];  Service: Gynecology;  Laterality: N/A;  . DILATION AND CURETTAGE OF UTERUS  1975  . [place]  . KNEE SURGERY  2000 2011   right knee replacement   . REPLACEMENT TOTAL KNEE  2000   left  . SHOULDER SURGERY  2004 2010   . TRANSTHORACIC ECHOCARDIOGRAM  07/2010   EF=>55%; LA mildly dilated; trace MR/TR;  . TUBAL LIGATION  1976    Allergies  Allergen Reactions  . Contrast Media [Iodinated Diagnostic Agents] Other (See Comments)    Reaction unknown  . Nsaids Other (See Comments)    Abnormal renal function  . Sensipar [Cinacalcet Hcl]     Pt stated, "Made me feel bloated, gassy and fluid buildup"  . Penicillins Rash and Other (See Comments)    Pt has taken Keflex without difficulty Has patient had a PCN reaction causing immediate rash, facial/tongue/throat swelling, SOB or lightheadedness with hypotension: yes Has patient had a PCN reaction causing severe rash involving mucus membranes or skin necrosis: no Has patient had a PCN reaction that required hospitalization no Has patient had a PCN reaction occurring within the last 10 years: no If all of the above answers are "NO", then may proceed with Cephalosporin use.    Current Outpatient Medications  Medication Sig Dispense Refill  . acetaminophen (TYLENOL) 500 MG tablet Take 1 tablet (500 mg total) by mouth every 4 (four) hours as needed for moderate pain. 60 tablet 0  . amLODipine (NORVASC) 5 MG tablet Take 1 tablet (5 mg total) by mouth daily.    [name] Kitchen aspirin (ECOTRIN LOW STRENGTH) 81 MG EC tablet Take 81 mg by mouth at bedtime. Swallow whole.     [name] Kitchen BYSTOLIC 10 MG  tablet TAKE 1/2 TABLET BY MOUTH TWICE A DAY 90 tablet 2  . cephALEXin (KEFLEX) 500 MG capsule Take 500-1,000 mg by mouth See admin instructions. TAKE ONLY FOR INVASIVE DENTAL PROCEDURES    . cinacalcet (SENSIPAR) 30 MG tablet 1 TABLET MON, WED, FRI, TAKE WITH LARGEST MEAL OF THE DAY    . furosemide (LASIX) 20 MG tablet Take 40 mg by mouth daily. Can take additional tablet if needed 30 tablet   . gabapentin (NEURONTIN) 100 MG capsule Take 100 mg by mouth at bedtime.    [name] Kitchen HUMULIN N KWIKPEN 100 UNIT/ML Kiwkpen Inject 12 Units into the skin at bedtime.   6  . levothyroxine (SYNTHROID) 50 MCG tablet Take 50 mcg by mouth daily before breakfast.    . lisinopril (ZESTRIL) 10 MG tablet TAKE 1 TABLET BY MOUTH EVERY DAY 90 tablet 2  . rosuvastatin (CRESTOR) 40 MG tablet TAKE 1 TABLET BY MOUTH AT BEDTIME 90 tablet 2  . Vitamin D, Cholecalciferol, 50 MCG (2000 UT) CAPS Take 2,000 Units by mouth daily.     No current facility-administered medications for this visit.    Social History   Socioeconomic History  . Marital status: Divorced    Spouse name: Not on file  . Number of children: 2  . Years of education: Not on file  . Highest education level: Not on file  Occupational History  . Not on file  Tobacco Use  . Smoking status: Never Smoker  . Smokeless tobacco: Never Used  Substance and Sexual Activity  . Alcohol use: No    Alcohol/week: 0.0 standard drinks  . Drug use: No  . Sexual activity: Not on file  Other Topics Concern  . Not on file  Social History Narrative   Work or School: none      Home Situation: lives alone, feels safe, son lives nearby      Spiritual Beliefs: Christian      Lifestyle: Y 3x per week, healthy diet       01/05/2019: Lives alone in one level house.  Goes to [employer] to do water aerobics 3X/week   Has son and daughter, son local      Social Determinants of Health   Financial Resource Strain:   . Difficulty of Paying Living Expenses: Not on file  Food  Insecurity:   . Worried About Programme researcher, broadcasting/film/video in the Last Year: Not on file  . Ran Out of Food in the Last Year: Not on file  Transportation Needs:   . Lack of Transportation (Medical): Not on file  . Lack of Transportation (Non-Medical): Not on file  Physical Activity:   . Days of Exercise per Week: Not on file  . Minutes of Exercise per Session: Not on file  Stress:   . Feeling of Stress : Not on file  Social Connections:   . Frequency of Communication with Friends and Family: Not on file  . Frequency of Social Gatherings with Friends and Family: Not on file  . Attends Religious Services: Not on file  . Active Member of Clubs or Organizations: Not on file  . Attends Banker Meetings: Not on file  . Marital Status: Not on file  Intimate Partner Violence:   . Fear of Current or Ex-Partner: Not on file  . Emotionally Abused: Not on file  . Physically Abused: Not on file  . Sexually Abused: Not on file   Social history is notable that she's divorced has 2 children one grandchild. She does exercise. There is no tobacco or alcohol use..  Family History  Problem Relation Age of Onset  . Hypertension Mother   . Diabetes Mother   . Hyperlipidemia Mother   . Heart attack Mother   . Heart disease Father   . Heart attack Father   . Stroke Brother   . Heart disease Brother   . Diabetes Brother   . Uterine cancer Maternal Grandmother     ROS General: Negative; No fevers, chills, or night sweats; No significant change in weight HEENT: Negative; No changes in vision or hearing, sinus congestion, difficulty swallowing Pulmonary: Negative; No cough, wheezing, shortness of breath, hemoptysis Cardiovascular: See history of present illness Previous leg swelling had resolved  GI: Negative; No nausea, vomiting, diarrhea, or abdominal pain GU: Negative; No dysuria, hematuria, or difficulty voiding Musculoskeletal: Negative; no myalgias, joint pain, or  weakness Hematologic/Oncology: Negative; no easy bruising, bleeding Endocrine: Positive for diabetes mellitus, positive for hyperparathyroidism Neuro: Negative; no changes in balance, headaches Skin: Negative; No rashes or skin lesions Psychiatric: Negative; No behavioral problems, depression Sleep: Negative; No snoring, daytime sleepiness, hypersomnolence, bruxism, restless legs, hypnogognic hallucinations, no cataplexy Other comprehensive 14 point system review is negative.   PE BP 122/62   Pulse 60   Ht 5\' 6"  (1.676 m)   Wt 189 lb 12.8 oz (86.1 kg)   SpO2 99%   BMI 30.63 kg/m    Repeat blood pressure by me was 134/70  Wt Readings from Last 3 Encounters:  07/15/20 189 lb 12.8 oz (86.1 kg)  05/01/20 194 lb 3.2 oz (88.1 kg)  12/27/19 195 lb (88.5 kg)    General: Alert, oriented, no distress.  Skin: normal turgor, no rashes, warm and dry HEENT: Normocephalic, atraumatic. Pupils equal round and reactive to light; sclera anicteric; extraocular muscles intact;  Nose without nasal septal hypertrophy Mouth/Parynx benign; Mallinpatti scale Neck: No JVD, transmitted murmur; normal carotid upstroke Lungs: clear to ausculatation and percussion; no wheezing or rales Chest wall: without tenderness to palpitation Heart: PMI not displaced, RRR, s1 s2  normal, 2/6 mid peaking systolic murmur in the aortic area, no diastolic murmur, no rubs, gallops, thrills, or heaves Abdomen: soft, nontender; no hepatosplenomehaly, BS+; abdominal aorta nontender and not dilated by palpation. Back: no CVA tenderness Pulses 2+ Musculoskeletal: full range of motion, normal strength, no joint deformities Extremities: Trivial left ankle swelling no clubbing cyanosis, Homan's sign negative  Neurologic: grossly nonfocal; Cranial nerves grossly wnl Psychologic: Normal mood and affect  ECG (independently read by me): Normal sinus rhythm at 60 bpm.  First-degree AV block with a PR interval at 258 ms.  LVH by  voltage in aVL.  No ectopy.  January 2021 ECG (independently read by me): Normal sinus rhythm at 61 bpm with first-degree AV block; PR interval 258 ms.  LVH by voltage criteria in aVL.    November 2019 ECG (independently read by me): Normal sinus rhythm at 61 bpm.  First-degree AV block.  LVH by voltage criteria.  October 2018 ECG (independently read by me): Sinus bradycardia 57 bpm with first-degree AV block.  LVH by voltage criteria.  PR interval 270 ms.  May 2018 ECG (independently read by me): sinus rhythm with first-degree AV block.  LVH by voltage criteria.  PR interval 250 ms.  April 2017 ECG (independently read by me): Normal sinus rhythm at 61 bpm with first-degree AV block with a PR interval of 244 ms.  LVH by voltage.  September 2016 ECG (independently read by me): Normal sinus rhythm at 60 bpm.  First degree block with a PR interval at 252 ms.  No significant ST-T changes.  October 2015 ECG (independently read by me): Sinus bradycardia at 57 beats per minute.  First degree AV block with a PR interval of 236 ms   September 2014 ECG: Sinus rhythm at 60 beats per minute. First grade AV block with PR interval of 244 ms. QTC interval 356 ms  LABS: BMP Latest Ref Rng & Units 01/11/2018 11/04/2017 05/25/2017  Glucose 70 - 99 mg/dL - 138(H) 256(H)  BUN 4 - 21 43(A) 36(H) 32(H)  Creatinine 0.5 - 1.1 1.7(A) 1.67(H) 1.67(H)  Sodium 137 - 147 142 140 136  Potassium 3.4 - 5.3 4.8 4.4 4.1  Chloride 96 - 112 mEq/L - 103 100(L)  CO2 19 - 32 mEq/L - 30 30  Calcium 8.4 - 10.5 mg/dL - 10.8(H) 10.9(H)   Hepatic Function Latest Ref Rng & Units 05/24/2014 01/23/2014 10/25/2013  Total Protein 6.0 - 8.3 g/dL 8.0 7.8 7.9  Albumin 3.5 - 5.2 g/dL 4.0 3.9 3.8  AST 0 - 37 U/L $Remo'25 23 26  'iTOnI$ ALT 0 - 35 U/L $Remo'15 16 22  'fXEET$ Alk Phosphatase 39 - 117 U/L 44 43 46  Total Bilirubin 0.2 - 1.2 mg/dL 0.6 0.5 0.5  Bilirubin, Direct 0.0 - 0.3 mg/dL 0.1 0.0 0.0   CBC Latest Ref Rng & Units 12/27/2019 11/04/2017 05/25/2017  WBC  4.0 - 10.5 K/uL 7.8 7.3 6.8  Hemoglobin 12.0 - 15.0 g/dL 12.2 12.3 12.1  Hematocrit 36 - 46 % 37.0 36.8 36.3  Platelets 150 - 400 K/uL 200.0 207.0 203   Lab Results  Component Value Date   MCV 96.1 12/27/2019   MCV 98.8 11/04/2017   MCV 98.1 05/25/2017   Lab Results  Component Value Date   TSH 2.00 01/30/2016   Lab Results  Component Value Date   HGBA1C 7.8 (H) 12/27/2019   Lab Results  Component Value Date   CALCIUM 10.8 (H) 11/04/2017   Lipid Panel  Component Value Date/Time   CHOL 168 11/04/2017 0819   TRIG 90.0 11/04/2017 0819   HDL 55.50 11/04/2017 0819   CHOLHDL 3 11/04/2017 0819   VLDL 18.0 11/04/2017 0819   LDLCALC 95 11/04/2017 0819    RADIOLOGY: No results found.  IMPRESSION:  1. Essential hypertension   2. Nonrheumatic aortic valve stenosis   3. First degree AV block   4. Hyperlipidemia with target LDL less than 70   5. Type 2 diabetes mellitus with microalbuminuria, with long-term current use of insulin (HCC)   6. Stage 3b chronic kidney disease     ASSESSMENT AND PLAN: Ms. Salsberry is a 85 year old African-American female who has a long-standing history of hypertension with grade 1 diastolic dysfunction and normal systolic function documented by her echocardiogram in September 2011. At that time she had aortic valve sclerosis without stenosis and  mitral annular calcification with trace MR and trace TR. Her echo Doppler study from 08/23/2015 showed an ejection fraction of 55-60%, mild LVH and grade 1 diastolic dysfunction.  There was very mild aortic stenosis with a mean gradient of 10 and peak gradient of 22.  A 2-year follow-up assessment 08/09/2017 continue to show hyperdynamic LV function with slight increase in her mean gradient at 16 mm with peak of 30 and her October 2019 echo demonstrated mild increase in her gradient with a mean gradient of 23 and peak gradient of 37 mmHg. Her most recent echo of November 06, 2019 revealed  normal systolic  function and essentially was unchanged with reference to the severity of aortic stenosis.  Her mean gradient was 19 with peak gradient 36.2.  She continues to be asymptomatic with reference to chest pain, dizziness, presyncope or syncope.  There is no change in her mild exertionally precipitated shortness of breath particularly with uphill walking.  She has had some issues with mild left ankle swelling which is trivial today on exam.  Her blood pressure is stable on a regimen consisting of amlodipine 5 mg, Bystolic 5 mg twice a day,furosemide 40 mg, lisinopril 10 mg daily.  She continues to be on rosuvastatin 40 mg for hyperlipidemia.  If LDL cholesterol remains consistently above 70 she may benefit from the addition of Zetia 10 mg daily.  She is on levothyroxine 50 mcg for hypothyroidism.  She has stage III chronic kidney disease and is now followed by [name].  She continues to be followed by [name] for her endocrinologic issues.  She has follow-up appointments with her nephrologist in September and her endocrinologist in December.  I will therefore see her in April 2022.  Prior to that evaluation she will undergo a 92-month follow-up echo Doppler study to reassess her aortic stenosis.   Troy Sine, MD, [facility]  07/15/2020 9:26 AM

## 2020-07-15 NOTE — Patient Instructions (Signed)
Medication Instructions:  CONTINUE WITH CURRENT MEDICATIONS. NO CHANGES.  *If you need a refill on your cardiac medications before your next appointment, please call your pharmacy*  Testing/Procedures: APRIL 2022 Your physician has requested that you have an echocardiogram. Echocardiography is a painless test that uses sound waves to create images of your heart. It provides your doctor with information about the size and shape of your heart and how well your heart's chambers and valves are working. This procedure takes approximately one hour. There are no restrictions for this procedure.     Follow-Up: At [facility], you and your health needs are our priority.  As part of our continuing mission to provide you with exceptional heart care, we have created designated Provider Care Teams.  These Care Teams include your primary Cardiologist (physician) and Advanced Practice Providers (APPs -  Physician Assistants and Nurse Practitioners) who all work together to provide you with the care you need, when you need it.  We recommend signing up for the patient portal called "MyChart".  Sign up information is provided on this After Visit Summary.  MyChart is used to connect with patients for Virtual Visits (Telemedicine).  Patients are able to view lab/test results, encounter notes, upcoming appointments, etc.  Non-urgent messages can be sent to your provider as well.   To learn more about what you can do with MyChart, go to [email or web address].    Your next appointment:   8 month(s)  The format for your next appointment:   In Person  Provider:   Shelva Majestic, MD

## 2020-08-02 ENCOUNTER — Other Ambulatory Visit: Payer: Self-pay

## 2020-08-02 ENCOUNTER — Ambulatory Visit: Payer: Medicare Other | Admitting: Family Medicine

## 2020-08-05 ENCOUNTER — Ambulatory Visit: Payer: Medicare Other | Admitting: Family Medicine

## 2020-08-05 ENCOUNTER — Encounter: Payer: Self-pay | Admitting: Family Medicine

## 2020-08-05 ENCOUNTER — Other Ambulatory Visit: Payer: Self-pay

## 2020-08-05 VITALS — BP 126/60 | HR 62 | Temp 98.0°F | Wt 191.0 lb

## 2020-08-05 DIAGNOSIS — Z794 Long term (current) use of insulin: Secondary | ICD-10-CM

## 2020-08-05 DIAGNOSIS — G629 Polyneuropathy, unspecified: Secondary | ICD-10-CM

## 2020-08-05 DIAGNOSIS — E039 Hypothyroidism, unspecified: Secondary | ICD-10-CM

## 2020-08-05 DIAGNOSIS — E1121 Type 2 diabetes mellitus with diabetic nephropathy: Secondary | ICD-10-CM | POA: Diagnosis not present

## 2020-08-05 DIAGNOSIS — I10 Essential (primary) hypertension: Secondary | ICD-10-CM | POA: Diagnosis not present

## 2020-08-05 DIAGNOSIS — I350 Nonrheumatic aortic (valve) stenosis: Secondary | ICD-10-CM | POA: Diagnosis not present

## 2020-08-05 DIAGNOSIS — E1122 Type 2 diabetes mellitus with diabetic chronic kidney disease: Secondary | ICD-10-CM

## 2020-08-05 DIAGNOSIS — N1832 Chronic kidney disease, stage 3b: Secondary | ICD-10-CM

## 2020-08-05 NOTE — Patient Instructions (Signed)
Managing Your Hypertension Hypertension is commonly called high blood pressure. This is when the force of your blood pressing against the walls of your arteries is too strong. Arteries are blood vessels that carry blood from your heart throughout your body. Hypertension forces the heart to work harder to pump blood, and may cause the arteries to become narrow or stiff. Having untreated or uncontrolled hypertension can cause heart attack, stroke, kidney disease, and other problems. What are blood pressure readings? A blood pressure reading consists of a higher number over a lower number. Ideally, your blood pressure should be below 120/80. The first ("top") number is called the systolic pressure. It is a measure of the pressure in your arteries as your heart beats. The second ("bottom") number is called the diastolic pressure. It is a measure of the pressure in your arteries as the heart relaxes. What does my blood pressure reading mean? Blood pressure is classified into four stages. Based on your blood pressure reading, your health care provider may use the following stages to determine what type of treatment you need, if any. Systolic pressure and diastolic pressure are measured in a unit called mm Hg. Normal  Systolic pressure: below 120.  Diastolic pressure: below 80. Elevated  Systolic pressure: 120-129.  Diastolic pressure: below 80. Hypertension stage 1  Systolic pressure: 130-139.  Diastolic pressure: 80-89. Hypertension stage 2  Systolic pressure: 140 or above.  Diastolic pressure: 90 or above. What health risks are associated with hypertension? Managing your hypertension is an important responsibility. Uncontrolled hypertension can lead to:  A heart attack.  A stroke.  A weakened blood vessel (aneurysm).  Heart failure.  Kidney damage.  Eye damage.  Metabolic syndrome.  Memory and concentration problems. What changes can I make to manage my  hypertension? Hypertension can be managed by making lifestyle changes and possibly by taking medicines. Your health care provider will help you make a plan to bring your blood pressure within a normal range. Eating and drinking   Eat a diet that is high in fiber and potassium, and low in salt (sodium), added sugar, and fat. An example eating plan is called the DASH (Dietary Approaches to Stop Hypertension) diet. To eat this way: ? Eat plenty of fresh fruits and vegetables. Try to fill half of your plate at each meal with fruits and vegetables. ? Eat whole grains, such as whole wheat pasta, brown rice, or whole grain bread. Fill about one quarter of your plate with whole grains. ? Eat low-fat diary products. ? Avoid fatty cuts of meat, processed or cured meats, and poultry with skin. Fill about one quarter of your plate with lean proteins such as fish, chicken without skin, beans, eggs, and tofu. ? Avoid premade and processed foods. These tend to be higher in sodium, added sugar, and fat.  Reduce your daily sodium intake. Most people with hypertension should eat less than 1,500 mg of sodium a day.  Limit alcohol intake to no more than 1 drink a day for nonpregnant women and 2 drinks a day for men. One drink equals 12 oz of beer, 5 oz of wine, or 1 oz of hard liquor. Lifestyle  Work with your health care provider to maintain a healthy body weight, or to lose weight. Ask what an ideal weight is for you.  Get at least 30 minutes of exercise that causes your heart to beat faster (aerobic exercise) most days of the week. Activities may include walking, swimming, or biking.  Include exercise   to strengthen your muscles (resistance exercise), such as weight lifting, as part of your weekly exercise routine. Try to do these types of exercises for 30 minutes at least 3 days a week.  Do not use any products that contain nicotine or tobacco, such as cigarettes and e-cigarettes. If you need help quitting,  ask your health care provider.  Control any long-term (chronic) conditions you have, such as high cholesterol or diabetes. Monitoring  Monitor your blood pressure at home as told by your health care provider. Your personal target blood pressure may vary depending on your medical conditions, your age, and other factors.  Have your blood pressure checked regularly, as often as told by your health care provider. Working with your health care provider  Review all the medicines you take with your health care provider because there may be side effects or interactions.  Talk with your health care provider about your diet, exercise habits, and other lifestyle factors that may be contributing to hypertension.  Visit your health care provider regularly. Your health care provider can help you create and adjust your plan for managing hypertension. Will I need medicine to control my blood pressure? Your health care provider may prescribe medicine if lifestyle changes are not enough to get your blood pressure under control, and if:  Your systolic blood pressure is 130 or higher.  Your diastolic blood pressure is 80 or higher. Take medicines only as told by your health care provider. Follow the directions carefully. Blood pressure medicines must be taken as prescribed. The medicine does not work as well when you skip doses. Skipping doses also puts you at risk for problems. Contact a health care provider if:  You think you are having a reaction to medicines you have taken.  You have repeated (recurrent) headaches.  You feel dizzy.  You have swelling in your ankles.  You have trouble with your vision. Get help right away if:  You develop a severe headache or confusion.  You have unusual weakness or numbness, or you feel faint.  You have severe pain in your chest or abdomen.  You vomit repeatedly.  You have trouble breathing. Summary  Hypertension is when the force of blood pumping  through your arteries is too strong. If this condition is not controlled, it may put you at risk for serious complications.  Your personal target blood pressure may vary depending on your medical conditions, your age, and other factors. For most people, a normal blood pressure is less than 120/80.  Hypertension is managed by lifestyle changes, medicines, or both. Lifestyle changes include weight loss, eating a healthy, low-sodium diet, exercising more, and limiting alcohol. This information is not intended to replace advice given to you by your health care provider. Make sure you discuss any questions you have with your health care provider. Document Revised: 03/03/2019 Document Reviewed: 10/07/2016 Elsevier Patient Education  2020 [place].  Diabetes Basics  Diabetes (diabetes mellitus) is a long-term (chronic) disease. It occurs when the body does not properly use sugar (glucose) that is released from food after you eat. Diabetes may be caused by one or both of these problems:  Your pancreas does not make enough of a hormone called insulin.  Your body does not react in a normal way to insulin that it makes. Insulin lets sugars (glucose) go into cells in your body. This gives you energy. If you have diabetes, sugars cannot get into cells. This causes high blood sugar (hyperglycemia). Follow these instructions at home:  How is diabetes treated? You may need to take insulin or other diabetes medicines daily to keep your blood sugar in balance. Take your diabetes medicines every day as told by your doctor. List your diabetes medicines here: Diabetes medicines  Name of medicine: ______________________________ ? Amount (dose): _______________ Time (a.m./p.m.): _______________ Notes: ___________________________________  Name of medicine: ______________________________ ? Amount (dose): _______________ Time (a.m./p.m.): _______________ Notes: ___________________________________  Name of  medicine: ______________________________ ? Amount (dose): _______________ Time (a.m./p.m.): _______________ Notes: ___________________________________ If you use insulin, you will learn how to give yourself insulin by injection. You may need to adjust the amount based on the food that you eat. List the types of insulin you use here: Insulin  Insulin type: ______________________________ ? Amount (dose): _______________ Time (a.m./p.m.): _______________ Notes: ___________________________________  Insulin type: ______________________________ ? Amount (dose): _______________ Time (a.m./p.m.): _______________ Notes: ___________________________________  Insulin type: ______________________________ ? Amount (dose): _______________ Time (a.m./p.m.): _______________ Notes: ___________________________________  Insulin type: ______________________________ ? Amount (dose): _______________ Time (a.m./p.m.): _______________ Notes: ___________________________________  Insulin type: ______________________________ ? Amount (dose): _______________ Time (a.m./p.m.): _______________ Notes: ___________________________________ How do I manage my blood sugar?  Check your blood sugar levels using a blood glucose monitor as directed by your doctor. Your doctor will set treatment goals for you. Generally, you should have these blood sugar levels:  Before meals (preprandial): 80-130 mg/dL (4.4-7.2 mmol/L).  After meals (postprandial): below 180 mg/dL (10 mmol/L).  A1c level: less than 7%. Write down the times that you will check your blood sugar levels: Blood sugar checks  Time: _______________ Notes: ___________________________________  Time: _______________ Notes: ___________________________________  Time: _______________ Notes: ___________________________________  Time: _______________ Notes: ___________________________________  Time: _______________ Notes:  ___________________________________  Time: _______________ Notes: ___________________________________  What do I need to know about low blood sugar? Low blood sugar is called hypoglycemia. This is when blood sugar is at or below 70 mg/dL (3.9 mmol/L). Symptoms may include:  Feeling: ? Hungry. ? Worried or nervous (anxious). ? Sweaty and clammy. ? Confused. ? Dizzy. ? Sleepy. ? Sick to your stomach (nauseous).  Having: ? A fast heartbeat. ? A headache. ? A change in your vision. ? Tingling or no feeling (numbness) around the mouth, lips, or tongue. ? Jerky movements that you cannot control (seizure).  Having trouble with: ? Moving (coordination). ? Sleeping. ? Passing out (fainting). ? Getting upset easily (irritability). Treating low blood sugar To treat low blood sugar, eat or drink something sugary right away. If you can think clearly and swallow safely, follow the 15:15 rule:  Take 15 grams of a fast-acting carb (carbohydrate). Talk with your doctor about how much you should take.  Some fast-acting carbs are: ? Sugar tablets (glucose pills). Take 3-4 glucose pills. ? 6-8 pieces of hard candy. ? 4-6 oz (120-150 mL) of fruit juice. ? 4-6 oz (120-150 mL) of regular (not diet) soda. ? 1 Tbsp (15 mL) honey or sugar.  Check your blood sugar 15 minutes after you take the carb.  If your blood sugar is still at or below 70 mg/dL (3.9 mmol/L), take 15 grams of a carb again.  If your blood sugar does not go above 70 mg/dL (3.9 mmol/L) after 3 tries, get help right away.  After your blood sugar goes back to normal, eat a meal or a snack within 1 hour. Treating very low blood sugar If your blood sugar is at or below 54 mg/dL (3 mmol/L), you have very low blood sugar (severe hypoglycemia). This is an emergency. Do not wait  to see if the symptoms will go away. Get medical help right away. Call your local emergency services (911 in the [place] Do not drive yourself to the  hospital. Questions to ask your health care provider  Do I need to meet with a diabetes educator?  What equipment will I need to care for myself at home?  What diabetes medicines do I need? When should I take them?  How often do I need to check my blood sugar?  What number can I call if I have questions?  When is my next doctor's visit?  Where can I find a support group for people with diabetes? Where to find more information  American Diabetes Association: www.diabetes.org  American Association of Diabetes Educators: www.diabeteseducator.org/patient-resources Contact a doctor if:  Your blood sugar is at or above 240 mg/dL (13.3 mmol/L) for 2 days in a row.  You have been sick or have had a fever for 2 days or more, and you are not getting better.  You have any of these problems for more than 6 hours: ? You cannot eat or drink. ? You feel sick to your stomach (nauseous). ? You throw up (vomit). ? You have watery poop (diarrhea). Get help right away if:  Your blood sugar is lower than 54 mg/dL (3 mmol/L).  You get confused.  You have trouble: ? Thinking clearly. ? Breathing. Summary  Diabetes (diabetes mellitus) is a long-term (chronic) disease. It occurs when the body does not properly use sugar (glucose) that is released from food after digestion.  Take insulin and diabetes medicines as told.  Check your blood sugar every day, as often as told.  Keep all follow-up visits as told by your doctor. This is important. This information is not intended to replace advice given to you by your health care provider. Make sure you discuss any questions you have with your health care provider. Document Revised: 08/02/2019 Document Reviewed: 02/11/2018 Elsevier Patient Education  [place].

## 2020-08-05 NOTE — Progress Notes (Signed)
Subjective:    Patient ID: Bonnie Chavez, female    DOB: [birth year], 86 y.o.   MRN: [id number]  No chief complaint on file.   HPI Patient was seen today for f/u.  Pt doing well overall.  Pt followed by nephrology, [name] for CKD stage III.  Recent labs from 07/23/2020 stable, creatinine 1.45 and GFR 38.  Labs stable for hyperparathyroidism, calcium 10.9 and PTH 52.  Pt states every once in a while she feels tired.  Pt also seen by [name] for endocrinology.  States blood sugar has been stable.  Taking Novolin 12 units nightly.  Checking FSBS in AM.  Had a low of 69 where she felt shaky.  Drank orange juice to improve her blood sugar.  Highest blood sugar reading 149.   Pt notes occasional numbness and tingling in left hand.  Patient drank tea with 2 Mck[place] over the weekend which seemed to help symptoms.  Patient states blood pressure controlled, seen by cardiology at the end of August.  Patient received influenza vaccine last week Past Medical History:  Diagnosis Date  . Anemia    History of , resolved  . Aortic valve sclerosis   . Carpal tunnel syndrome   . Chicken pox   . Chronic low back pain 02/27/2016   -DDD -seeing [facility]   . Diabetes mellitus    type 2, sees Dr. Chalmers Cater  . Goiter    Dr. Chalmers Cater  . Heart murmur   . History of nuclear stress test 06/2010   dipyridamole; normal pattern of perfusion; ekg negative for ischemia; low risk scan   . Hot flashes   . Hyperlipidemia   . Hyperparathyroidism ([place]) 02/27/2016   -sees endocrinologist, Dr. Chalmers Cater, has seen surgeon as well   . Hypertension   . Migraine    no longer has migraines (80's)  . Mitral valve prolapse   . Neuropathy   . Obesity 02/27/2016  . Osteoarthritis    DDD, sees [facility]  . PMB (postmenopausal bleeding) 02/28/2009   Resolved  . Pneumonia    history of   . Renal insufficiency    [name]   . Vaginal atrophy   . Vitamin D deficiency     Allergies  Allergen Reactions  . Contrast  Media [Iodinated Diagnostic Agents] Other (See Comments)    Reaction unknown  . Nsaids Other (See Comments)    Abnormal renal function  . Sensipar [Cinacalcet Hcl]     Pt stated, "Made me feel bloated, gassy and fluid buildup"  . Penicillins Rash and Other (See Comments)    Pt has taken Keflex without difficulty Has patient had a PCN reaction causing immediate rash, facial/tongue/throat swelling, SOB or lightheadedness with hypotension: yes Has patient had a PCN reaction causing severe rash involving mucus membranes or skin necrosis: no Has patient had a PCN reaction that required hospitalization no Has patient had a PCN reaction occurring within the last 10 years: no If all of the above answers are "NO", then may proceed with Cephalosporin use.    ROS General: Denies fever, chills, night sweats, changes in weight, changes in appetite  + fatigue HEENT: Denies headaches, ear pain, changes in vision, rhinorrhea, sore throat CV: Denies CP, palpitations, SOB, orthopnea Pulm: Denies SOB, cough, wheezing GI: Denies abdominal pain, nausea, vomiting, diarrhea, constipation GU: Denies dysuria, hematuria, frequency, vaginal discharge Msk: Denies muscle cramps, joint pains Neuro: Denies weakness + numbness, tingling Skin: Denies rashes, bruising Psych: Denies depression, anxiety, hallucinations  Objective:    Blood pressure 126/60, pulse 62, temperature 98 F (36.7 C), temperature source Oral, weight 191 lb (86.6 kg), SpO2 98 %. BP recheck 124/59  Gen. Pleasant, well-nourished, in no distress, normal affect   HEENT: /AT, face symmetric, conjunctiva clear, no scleral icterus, PERRLA, EOMI, nares patent without drainage. Lungs: no accessory muscle use, CTAB, no wheezes or rales Cardiovascular: RRR, 3/6 murmur, no peripheral edema Abdomen: BS present, soft, NT/ND Musculoskeletal: No deformities, no cyanosis or clubbing, normal tone Neuro:  A&Ox3, CN II-XII intact, normal gait Skin:   Warm, no lesions/ rash   Wt Readings from Last 3 Encounters:  08/05/20 191 lb (86.6 kg)  07/15/20 189 lb 12.8 oz (86.1 kg)  05/01/20 194 lb 3.2 oz (88.1 kg)    Lab Results  Component Value Date   WBC 7.8 12/27/2019   HGB 12.2 12/27/2019   HCT 37.0 12/27/2019   PLT 200.0 12/27/2019   GLUCOSE 138 (H) 11/04/2017   CHOL 168 11/04/2017   TRIG 90.0 11/04/2017   HDL 55.50 11/04/2017   LDLCALC 95 11/04/2017   ALT 15 05/24/2014   AST 25 05/24/2014   NA 142 01/11/2018   K 4.8 01/11/2018   CL 103 11/04/2017   CREATININE 1.7 (A) 01/11/2018   BUN 43 (A) 01/11/2018   CO2 30 11/04/2017   TSH 2.00 01/30/2016   INR 1.62 (H) 10/30/2010   HGBA1C 7.8 (H) 12/27/2019   MICROALBUR 0.9 05/24/2014    Assessment/Plan:  Essential hypertension -Controlled.  BP recheck 123/59 -Continue lifestyle modifications -Continue current medication including Bystolic 5 mg twice daily, Lasix 40 mg, Norvasc 5 mg, lisinopril 10 mg -Continue follow-up with cardiology and repeat echo.  Type 2 diabetes mellitus with stage 3b chronic kidney disease, with long-term current use of insulin (HCC) -continue humulin 12 units qhs -On ACE-lisinopril 10 mg, rosuvastatin 40 mg -continue lifestyle modifications -continue f/u with Endocrinology, Dr.Balin  Neuropathy -Carpal tunnel versus diabetic neuropathy -Discussed supportive care to help with symptom treatment including turmeric, topical analgesics, stretching exercises, heat -consider checking vitamin B12 and folate  Nonrheumatic aortic valve stenosis -3/6 murmur heard on exam -Follow-up echo planned early 2022 with cardiology -Continue Bystolic 5 mg twice daily -Continue follow-up with cardiology  Chronic kidney disease stage IIIb -Labs from 8/31 reviewed creatinine 1.45 and GFR 38 -Iron studies normal: Hemoglobin 11.7, ferritin 273, iron saturation 23 -Avoid nephrotoxic meds -Patient received influenza vaccine last week. -Continue follow-up with Dr.  Jimmy Footman, nephrology  Acquired hypothyroidism -Continue Synthroid 50 mcg daily -Labs reviewed from 07/23/2020 calcium 2.9, PTH 52.   -Continue to monitor -Continue follow-up with endocrinology, [name].  F/u as needed in the next 3-4 months, sooner if needed  Grier Mitts, MD

## 2020-09-17 DIAGNOSIS — H6991 Unspecified Eustachian tube disorder, right ear: Secondary | ICD-10-CM | POA: Insufficient documentation

## 2020-09-19 ENCOUNTER — Other Ambulatory Visit: Payer: Self-pay | Admitting: Cardiovascular Disease

## 2020-10-28 ENCOUNTER — Telehealth: Payer: Self-pay | Admitting: Family Medicine

## 2020-10-28 NOTE — Telephone Encounter (Signed)
Left message for patient to call back and schedule Medicare Annual Wellness Visit (AWV) either virtually or in office.   Last AWV 01/05/19 please schedule at anytime with [facility] Nurse Health Advisor 1 or 2   This should be a 45 minute visit.

## 2020-11-01 NOTE — Progress Notes (Signed)
Subjective:   Bonnie Chavez is a 86 y.o. female who presents for Medicare Annual (Subsequent) preventive examination.  Review of Systems    N/A  Cardiac Risk Factors include: advanced age (>37men, >75 women);hypertension;dyslipidemia     Objective:    Today's Vitals   11/04/20 0851  BP: 130/64  Pulse: 67  Temp: 98.3 F (36.8 C)  TempSrc: Oral  SpO2: 98%  Weight: 187 lb 4 oz (84.9 kg)  Height: 5\' 6"  (1.676 m)   Body mass index is 30.22 kg/m.  Advanced Directives 11/04/2020 01/05/2019 11/05/2017 05/25/2017 10/29/2016 08/14/2016 02/28/2015  Does Patient Have a Medical Advance Directive? Yes Yes Yes Yes Yes No Yes  Type of Paramedic of [place];Living will [place];Living will - [place];Living will - - [place];Living will  Does patient want to make changes to medical advance directive? No - Patient declined - - No - Patient declined - - No - Patient declined  Copy of [place] in Chart? Yes - validated most recent copy scanned in chart (See row information) Yes - validated most recent copy scanned in chart (See row information) - No - copy requested - - No - copy requested  Would patient like information on creating a medical advance directive? - - - - - No - patient declined information -    Current Medications (verified) Outpatient Encounter Medications as of 11/04/2020  Medication Sig  . acetaminophen (TYLENOL) 500 MG tablet Take 1 tablet (500 mg total) by mouth every 4 (four) hours as needed for moderate pain.  [name] Kitchen amLODipine (NORVASC) 5 MG tablet Take 1 tablet (5 mg total) by mouth daily.  [name] Kitchen aspirin 81 MG EC tablet Take 81 mg by mouth at bedtime. Swallow whole.  [name] Kitchen BYSTOLIC 10 MG tablet TAKE 1/2 TABLET BY MOUTH TWICE A DAY  . cinacalcet (SENSIPAR) 30 MG tablet 1 TABLET MON, WED, FRI, TAKE WITH LARGEST MEAL OF THE DAY  . furosemide (LASIX) 20 MG tablet Take 40 mg by mouth daily.  Can take additional tablet if needed  . gabapentin (NEURONTIN) 100 MG capsule Take 100 mg by mouth at bedtime.  [name] Kitchen HUMULIN N KWIKPEN 100 UNIT/ML Kiwkpen Inject 12 Units into the skin at bedtime.   [name] Kitchen levothyroxine (SYNTHROID) 50 MCG tablet Take 50 mcg by mouth daily before breakfast.  . lisinopril (ZESTRIL) 10 MG tablet TAKE 1 TABLET BY MOUTH EVERY DAY  . OneTouch Delica Lancets 31S MISC Apply topically.  Glory Rosebush VERIO test strip 1 each 2 (two) times daily.  . rosuvastatin (CRESTOR) 40 MG tablet TAKE 1 TABLET BY MOUTH AT BEDTIME  . BD PEN NEEDLE NANO 2ND GEN 32G X 4 MM MISC USE AS DIRECTED 90  . cephALEXin (KEFLEX) 500 MG capsule Take 500-1,000 mg by mouth See admin instructions. TAKE ONLY FOR INVASIVE DENTAL PROCEDURES (Patient not taking: Reported on 11/04/2020)   No facility-administered encounter medications on file as of 11/04/2020.    Allergies (verified) Contrast media [iodinated diagnostic agents], Nsaids, Sensipar [cinacalcet hcl], and Penicillins   History: Past Medical History:  Diagnosis Date  . Anemia    History of , resolved  . Aortic valve sclerosis   . Carpal tunnel syndrome   . Chicken pox   . Chronic low back pain 02/27/2016   -DDD -seeing [facility]   . Diabetes mellitus    type 2, sees Dr. Chalmers Cater  . Goiter    Dr. Chalmers Cater  . Heart  murmur   . History of nuclear stress test 06/2010   dipyridamole; normal pattern of perfusion; ekg negative for ischemia; low risk scan   . Hot flashes   . Hyperlipidemia   . Hyperparathyroidism ([place]) 02/27/2016   -sees endocrinologist, Dr. Chalmers Cater, has seen surgeon as well   . Hypertension   . Migraine    no longer has migraines (80's)  . Mitral valve prolapse   . Neuropathy   . Obesity 02/27/2016  . Osteoarthritis    DDD, sees [facility]  . PMB (postmenopausal bleeding) 02/28/2009   Resolved  . Pneumonia    history of   . Renal insufficiency    [name]   . Vaginal atrophy   . Vitamin D deficiency    Past  Surgical History:  Procedure Laterality Date  . [place] &1976  . CHOLECYSTECTOMY  1991  . COLONOSCOPY    . DILATATION & CURRETTAGE/HYSTEROSCOPY WITH RESECTOCOPE N/A 05/15/2013   Procedure: [place];  Surgeon: Eldred Manges, MD;  Location: [facility];  Service: Gynecology;  Laterality: N/A;  . DILATATION & CURRETTAGE/HYSTEROSCOPY WITH RESECTOCOPE N/A 06/03/2017   Procedure: DILATATION & CURETTAGE/HYSTEROSCOPY;  Surgeon: Eldred Manges, MD;  Location: [facility];  Service: Gynecology;  Laterality: N/A;  . DILATION AND CURETTAGE OF UTERUS  1975  . [place]  . KNEE SURGERY  2000 2011   right knee replacement   . REPLACEMENT TOTAL KNEE  2000   left  . SHOULDER SURGERY  2004 2010   . TRANSTHORACIC ECHOCARDIOGRAM  07/2010   EF=>55%; LA mildly dilated; trace MR/TR;  . TUBAL LIGATION  1976   Family History  Problem Relation Age of Onset  . Hypertension Mother   . Diabetes Mother   . Hyperlipidemia Mother   . Heart attack Mother   . Heart disease Father   . Heart attack Father   . Stroke Brother   . Heart disease Brother   . Diabetes Brother   . Uterine cancer Maternal Grandmother    Social History   Socioeconomic History  . Marital status: Divorced    Spouse name: Not on file  . Number of children: 2  . Years of education: Not on file  . Highest education level: Not on file  Occupational History  . Not on file  Tobacco Use  . Smoking status: Never Smoker  . Smokeless tobacco: Never Used  Substance and Sexual Activity  . Alcohol use: No    Alcohol/week: 0.0 standard drinks  . Drug use: No  . Sexual activity: Not on file  Other Topics Concern  . Not on file  Social History Narrative   Work or School: none      Home Situation: lives alone, feels safe, son lives nearby      Spiritual Beliefs: Christian      Lifestyle: Y 3x per week, healthy diet       01/05/2019: Lives alone in one level house.    Goes to [employer] to do water aerobics 3X/week   Has son and daughter, son local      Social Determinants of Health   Financial Resource Strain: Low Risk   . Difficulty of Paying Living Expenses: Not hard at all  Food Insecurity: No Food Insecurity  . Worried About Charity fundraiser in the Last Year: Never true  . Ran Out of Food in the Last Year: Never true  Transportation Needs: No Transportation Needs  .  Lack of Transportation (Medical): No  . Lack of Transportation (Non-Medical): No  Physical Activity: Insufficiently Active  . Days of Exercise per Week: 2 days  . Minutes of Exercise per Session: 60 min  Stress: No Stress Concern Present  . Feeling of Stress : Not at all  Social Connections: Moderately Isolated  . Frequency of Communication with Friends and Family: More than three times a week  . Frequency of Social Gatherings with Friends and Family: More than three times a week  . Attends Religious Services: More than 4 times per year  . Active Member of Clubs or Organizations: No  . Attends Archivist Meetings: Never  . Marital Status: Divorced    Tobacco Counseling Counseling given: Not Answered   Clinical Intake:  Pre-visit preparation completed: Yes  Pain : No/denies pain     Nutritional Risks: None Diabetes: Yes CBG done?: No Did pt. bring in CBG monitor from home?: No  How often do you need to have someone help you when you read instructions, pamphlets, or other written materials from your doctor or pharmacy?: 1 - Never What is the last grade level you completed in school?: Some College  Diabetic?Yes Nutrition Risk Assessment:  Has the patient had any N/V/D within the last 2 months?  No  Does the patient have any non-healing wounds?  No  Has the patient had any unintentional weight loss or weight gain?  No   Diabetes:  Is the patient diabetic?  Yes  If diabetic, was a CBG obtained today?  No  Did the patient bring in their glucometer from  home?  No  How often do you monitor your CBG's? Patient states checks glucose twice a day.   Financial Strains and Diabetes Management:  Are you having any financial strains with the device, your supplies or your medication? No .  Does the patient want to be seen by Chronic Care Management for management of their diabetes?  No  Would the patient like to be referred to a Nutritionist or for Diabetic Management?  No   Diabetic Exams:  Diabetic Eye Exam: Completed 01/29/2020 Diabetic Foot Exam: Completed 05/01/2020   Interpreter Needed?: No  Information entered by :: [place] of Daily Living In your present state of health, do you have any difficulty performing the following activities: 11/04/2020  Hearing? Y  Comment Has bilateral hearing aids  Vision? N  Difficulty concentrating or making decisions? Y  Comment has short term memory loss at times  Walking or climbing stairs? Y  Comment Patient states gets winded when climbing stairs  Dressing or bathing? N  Doing errands, shopping? N  Preparing Food and eating ? N  Using the Toilet? N  In the past six months, have you accidently leaked urine? N  Do you have problems with loss of bowel control? N  Managing your Medications? N  Managing your Finances? N  Housekeeping or managing your Housekeeping? N  Some recent data might be hidden    Patient Care Team: Billie Ruddy, MD as PCP - General (Family Medicine) Jacelyn Pi, MD as Consulting Physician (Endocrinology) Deterding, Jeneen Rinks, MD as Consulting Physician (Nephrology) Leo Grosser, Seymour Bars, MD (Inactive) as Consulting Physician (Obstetrics and Gynecology) Sharyne Peach, MD as Consulting Physician (Ophthalmology) Gaynelle Arabian, MD as Consulting Physician (Orthopedic Surgery)  Indicate any recent Medical Services you may have received from other than [facility] providers in the past year (date may be approximate).     Assessment:  This is a routine  wellness examination for Keliyah.  Hearing/Vision screen  Hearing Screening   125Hz  250Hz  500Hz  1000Hz  2000Hz  3000Hz  4000Hz  6000Hz  8000Hz   Right ear:           Left ear:           Vision Screening Comments: Patient states gets eyes checked once per year in March  Dietary issues and exercise activities discussed: Current Exercise Habits: Structured exercise class, Time (Minutes): 60, Frequency (Times/Week): 2, Weekly Exercise (Minutes/Week): 120, Exercise limited by: Other - see comments;respiratory conditions(s) (SOB)  Goals    . Exercise 150 min/wk Moderate Activity     Continues exercise at the y x 3 per week     . patient      Eat right; stay healthy Fat free or low fat dairy products Fish high in omega-3 acids ( salmon, tuna, trout) Fruits, such as apples, bananas, oranges, pears, prunes Legumes, such as kidney beans, lentils, checkpeas, black-eyed peas and lima beans Vegetables; broccoli, cabbage, carrots Whole grains;   Plant fats are better; decrease "white" foods as pasta, rice, bread and desserts, sugar; Avoid red meat (limiting) palm and coconut oils; sugary foods and beverages  Two nutrients that raise blood chol levels are saturated fats and trans fat; in hydrogenated oils and fats, as stick margarine, baked goods (cookes, cakes, pies, crackers; frosting; and coffee creamers;   Some Fats lower cholesterol: Monounsaturated and polyunsaturated  Avocados Corn, sunflower, and soybean oils Nuts and seeds, such as walnuts Olive, canola, peanut, safflower, and sesame oils Peanut butter Salmon and trout Tofu       . Patient Stated     Lose 5 pounds by next year!  Get A1c down by monitoring portions and carbohydrate/simple sugar intake.      Depression Screen PHQ 2/9 Scores 11/04/2020 05/01/2020 01/05/2019 11/05/2017 11/04/2017 10/29/2016 07/02/2016  PHQ - 2 Score 0 0 0 0 0 0 0  PHQ- 9 Score 0 - 0 - - - -    Fall Risk Fall Risk  11/04/2020 05/01/2020 01/05/2019 11/05/2017  11/04/2017  Falls in the past year? 0 0 0 No No  Comment - - - - -  Number falls in past yr: 0 0 - - -  Injury with Fall? 0 0 - - -  Risk for fall due to : No Fall Risks No Fall Risks - - -  Follow up Falls prevention discussed;Falls evaluation completed Falls evaluation completed Falls prevention discussed - -    FALL RISK PREVENTION PERTAINING TO THE HOME:  Any stairs in or around the home? No  If so, are there any without handrails? No  Home free of loose throw rugs in walkways, pet beds, electrical cords, etc? Yes  Adequate lighting in your home to reduce risk of falls? Yes   ASSISTIVE DEVICES UTILIZED TO PREVENT FALLS:  Life alert? No  Use of a cane, walker or w/c? Yes  Grab bars in the bathroom? Yes  Shower chair or bench in shower? Yes  Elevated toilet seat or a handicapped toilet? Yes   TIMED UP AND GO:  Was the test performed? Yes .  Length of time to ambulate 10 feet: 6 sec.   Gait slow and steady without use of assistive device  Cognitive Function: MMSE - Mini Mental State Exam 11/05/2017 10/29/2016  Not completed: (No Data) (No Data)     6CIT Screen 11/04/2020  What Year? 0 points  What month? 0 points  What time? 0 points  Count back from 20 0 points  Months in reverse 0 points  Repeat phrase 2 points  Total Score 2    Immunizations Immunization History  Administered Date(s) Administered  . Fluad Quad(high Dose 65+) 08/08/2019, 07/23/2020  . Influenza Whole 08/23/2012  . Influenza, High Dose Seasonal PF 08/25/2016, 08/07/2018  . Influenza,inj,quad, With Preservative 08/24/2015, 08/19/2017  . Influenza-Unspecified 08/26/2015, 08/25/2016, 08/19/2017  . PFIZER SARS-COV-2 Vaccination 02/05/2020, 02/26/2020  . Pneumococcal Conjugate-13 02/28/2015  . Pneumococcal Polysaccharide-23 07/25/2011  . Unspecified SARS-COV-2 Vaccination 02/05/2020, 02/26/2020  . Zoster Recombinat (Shingrix) 08/07/2018, 01/11/2019    TDAP status: Due, Education has been  provided regarding the importance of this vaccine. Advised may receive this vaccine at local pharmacy or Health Dept. Aware to provide a copy of the vaccination record if obtained from local pharmacy or Health Dept. Verbalized acceptance and understanding.  Flu Vaccine status: Up to date  Pneumococcal vaccine status: Up to date  Covid-19 vaccine status: Completed vaccines  Qualifies for Shingles Vaccine? Yes   Zostavax completed No   Shingrix Completed?: Yes  Screening Tests Health Maintenance  Topic Date Due  . COVID-19 Vaccine (3 - Booster for Pfizer series) 08/27/2020  . TETANUS/TDAP  02/27/2025 (Originally 03/25/1953)  . HEMOGLOBIN A1C  12/25/2020  . OPHTHALMOLOGY EXAM  01/28/2021  . FOOT EXAM  05/01/2021  . INFLUENZA VACCINE  Completed  . DEXA SCAN  Completed  . PNA vac Low Risk Adult  Completed    Health Maintenance  Health Maintenance Due  Topic Date Due  . COVID-19 Vaccine (3 - Booster for Pfizer series) 08/27/2020    Colorectal cancer screening: No longer required.   Mammogram status: No longer required due to age.  Bone Density status: Completed 06/07/2019. Results reflect: Bone density results: OSTEOPENIA. Repeat every 5 years.  Lung Cancer Screening: (Low Dose CT Chest recommended if Age 12-80 years, 30 pack-year currently smoking OR have quit w/in 15years.) does not qualify.   Lung Cancer Screening Referral: N/A   Additional Screening:  Hepatitis C Screening: does not qualify;   Vision Screening: Recommended annual ophthalmology exams for early detection of glaucoma and other disorders of the eye. Is the patient up to date with their annual eye exam?  Yes  Who is the provider or what is the name of the office in which the patient attends annual eye exams? Dr. Melissa Noon  If pt is not established with a provider, would they like to be referred to a provider to establish care? No .   Dental Screening: Recommended annual dental exams for proper oral  hygiene  Community Resource Referral / Chronic Care Management: CRR required this visit?  No   CCM required this visit?  No      Plan:     I have personally reviewed and noted the following in the patient's chart:   . Medical and social history . Use of alcohol, tobacco or illicit drugs  . Current medications and supplements . Functional ability and status . Nutritional status . Physical activity . Advanced directives . List of other physicians . Hospitalizations, surgeries, and ER visits in previous 12 months . Vitals . Screenings to include cognitive, depression, and falls . Referrals and appointments  In addition, I have reviewed and discussed with patient certain preventive protocols, quality metrics, and best practice recommendations. A written personalized care plan for preventive services as well as general preventive health recommendations were provided to patient.     Ofilia Neas, LPN   [id number]  Nurse Notes: None

## 2020-11-04 ENCOUNTER — Other Ambulatory Visit: Payer: Self-pay

## 2020-11-04 ENCOUNTER — Ambulatory Visit (INDEPENDENT_AMBULATORY_CARE_PROVIDER_SITE_OTHER): Payer: Medicare Other

## 2020-11-04 VITALS — BP 130/64 | HR 67 | Temp 98.3°F | Ht 66.0 in | Wt 187.2 lb

## 2020-11-04 DIAGNOSIS — Z0000 Encounter for general adult medical examination without abnormal findings: Secondary | ICD-10-CM | POA: Diagnosis not present

## 2020-11-04 NOTE — Patient Instructions (Addendum)
Bonnie Chavez , Thank you for taking time to come for your Medicare Wellness Visit. I appreciate your ongoing commitment to your health goals. Please review the following plan we discussed and let me know if I can assist you in the future.   Screening recommendations/referrals: Colonoscopy: No longer required  Mammogram: No longer required  Bone Density: No longer required  Recommended yearly ophthalmology/optometry visit for glaucoma screening and checkup Recommended yearly dental visit for hygiene and checkup  Vaccinations: Influenza vaccine: up to date, next due fall 2022  Pneumococcal vaccine: Completed series  Tdap vaccine: Currently due you may await and injury to receive  Shingles vaccine: Completed series     Advanced directives: Copies on file   Conditions/risks identified: None   Next appointment: 12/06/2020 @ 9:00 am with Dr. Volanda Napoleon   Preventive Care 84 Years and Older, Female Preventive care refers to lifestyle choices and visits with your health care provider that can promote health and wellness. What does preventive care include?  A yearly physical exam. This is also called an annual well check.  Dental exams once or twice a year.  Routine eye exams. Ask your health care provider how often you should have your eyes checked.  Personal lifestyle choices, including:  Daily care of your teeth and gums.  Regular physical activity.  Eating a healthy diet.  Avoiding tobacco and drug use.  Limiting alcohol use.  Practicing safe sex.  Taking low-dose aspirin every day.  Taking vitamin and mineral supplements as recommended by your health care provider. What happens during an annual well check? The services and screenings done by your health care provider during your annual well check will depend on your age, overall health, lifestyle risk factors, and family history of disease. Counseling  Your health care provider may ask you questions about your:  Alcohol  use.  Tobacco use.  Drug use.  Emotional well-being.  Home and relationship well-being.  Sexual activity.  Eating habits.  History of falls.  Memory and ability to understand (cognition).  Work and work Statistician.  Reproductive health. Screening  You may have the following tests or measurements:  Height, weight, and BMI.  Blood pressure.  Lipid and cholesterol levels. These may be checked every 5 years, or more frequently if you are over 84 years old.  Skin check.  Lung cancer screening. You may have this screening every year starting at age 84 if you have a 30-pack-year history of smoking and currently smoke or have quit within the past 15 years.  Fecal occult blood test (FOBT) of the stool. You may have this test every year starting at age 84.  Flexible sigmoidoscopy or colonoscopy. You may have a sigmoidoscopy every 5 years or a colonoscopy every 10 years starting at age 84.  Hepatitis C blood test.  Hepatitis B blood test.  Sexually transmitted disease (STD) testing.  Diabetes screening. This is done by checking your blood sugar (glucose) after you have not eaten for a while (fasting). You may have this done every 1-3 years.  Bone density scan. This is done to screen for osteoporosis. You may have this done starting at age 84.  Mammogram. This may be done every 1-2 years. Talk to your health care provider about how often you should have regular mammograms. Talk with your health care provider about your test results, treatment options, and if necessary, the need for more tests. Vaccines  Your health care provider may recommend certain vaccines, such as:  Influenza vaccine. This  is recommended every year.  Tetanus, diphtheria, and acellular pertussis (Tdap, Td) vaccine. You may need a Td booster every 10 years.  Zoster vaccine. You may need this after age 84.  Pneumococcal 13-valent conjugate (PCV13) vaccine. One dose is recommended after age  84.  Pneumococcal polysaccharide (PPSV23) vaccine. One dose is recommended after age 84. Talk to your health care provider about which screenings and vaccines you need and how often you need them. This information is not intended to replace advice given to you by your health care provider. Make sure you discuss any questions you have with your health care provider. Document Released: 12/06/2015 Document Revised: 07/29/2016 Document Reviewed: 09/10/2015 Elsevier Interactive Patient Education  2017 [place] Prevention in the Home Falls can cause injuries. They can happen to people of all ages. There are many things you can do to make your home safe and to help prevent falls. What can I do on the outside of my home?  Regularly fix the edges of walkways and driveways and fix any cracks.  Remove anything that might make you trip as you walk through a door, such as a raised step or threshold.  Trim any bushes or trees on the path to your home.  Use bright outdoor lighting.  Clear any walking paths of anything that might make someone trip, such as rocks or tools.  Regularly check to see if handrails are loose or broken. Make sure that both sides of any steps have handrails.  Any raised decks and porches should have guardrails on the edges.  Have any leaves, snow, or ice cleared regularly.  Use sand or salt on walking paths during winter.  Clean up any spills in your garage right away. This includes oil or grease spills. What can I do in the bathroom?  Use night lights.  Install grab bars by the toilet and in the tub and shower. Do not use towel bars as grab bars.  Use non-skid mats or decals in the tub or shower.  If you need to sit down in the shower, use a plastic, non-slip stool.  Keep the floor dry. Clean up any water that spills on the floor as soon as it happens.  Remove soap buildup in the tub or shower regularly.  Attach bath mats securely with double-sided  non-slip rug tape.  Do not have throw rugs and other things on the floor that can make you trip. What can I do in the bedroom?  Use night lights.  Make sure that you have a light by your bed that is easy to reach.  Do not use any sheets or blankets that are too big for your bed. They should not hang down onto the floor.  Have a firm chair that has side arms. You can use this for support while you get dressed.  Do not have throw rugs and other things on the floor that can make you trip. What can I do in the kitchen?  Clean up any spills right away.  Avoid walking on wet floors.  Keep items that you use a lot in easy-to-reach places.  If you need to reach something above you, use a strong step stool that has a grab bar.  Keep electrical cords out of the way.  Do not use floor polish or wax that makes floors slippery. If you must use wax, use non-skid floor wax.  Do not have throw rugs and other things on the floor that can make you  trip. What can I do with my stairs?  Do not leave any items on the stairs.  Make sure that there are handrails on both sides of the stairs and use them. Fix handrails that are broken or loose. Make sure that handrails are as long as the stairways.  Check any carpeting to make sure that it is firmly attached to the stairs. Fix any carpet that is loose or worn.  Avoid having throw rugs at the top or bottom of the stairs. If you do have throw rugs, attach them to the floor with carpet tape.  Make sure that you have a light switch at the top of the stairs and the bottom of the stairs. If you do not have them, ask someone to add them for you. What else can I do to help prevent falls?  Wear shoes that:  Do not have high heels.  Have rubber bottoms.  Are comfortable and fit you well.  Are closed at the toe. Do not wear sandals.  If you use a stepladder:  Make sure that it is fully opened. Do not climb a closed stepladder.  Make sure that both  sides of the stepladder are locked into place.  Ask someone to hold it for you, if possible.  Clearly mark and make sure that you can see:  Any grab bars or handrails.  First and last steps.  Where the edge of each step is.  Use tools that help you move around (mobility aids) if they are needed. These include:  Canes.  Walkers.  Scooters.  Crutches.  Turn on the lights when you go into a dark area. Replace any light bulbs as soon as they burn out.  Set up your furniture so you have a clear path. Avoid moving your furniture around.  If any of your floors are uneven, fix them.  If there are any pets around you, be aware of where they are.  Review your medicines with your doctor. Some medicines can make you feel dizzy. This can increase your chance of falling. Ask your doctor what other things that you can do to help prevent falls. This information is not intended to replace advice given to you by your health care provider. Make sure you discuss any questions you have with your health care provider. Document Released: 09/05/2009 Document Revised: 04/16/2016 Document Reviewed: 12/14/2014 Elsevier Interactive Patient Education  2017 [employer].

## 2020-11-21 ENCOUNTER — Other Ambulatory Visit: Payer: Self-pay | Admitting: Cardiovascular Disease

## 2020-12-05 ENCOUNTER — Other Ambulatory Visit: Payer: Self-pay

## 2020-12-06 ENCOUNTER — Other Ambulatory Visit: Payer: Self-pay | Admitting: Cardiovascular Disease

## 2020-12-06 ENCOUNTER — Ambulatory Visit: Payer: Medicare Other | Admitting: Family Medicine

## 2020-12-06 ENCOUNTER — Encounter: Payer: Self-pay | Admitting: Family Medicine

## 2020-12-06 VITALS — BP 120/62 | HR 60 | Temp 98.1°F | Wt 192.2 lb

## 2020-12-06 DIAGNOSIS — N1832 Chronic kidney disease, stage 3b: Secondary | ICD-10-CM

## 2020-12-06 DIAGNOSIS — Z794 Long term (current) use of insulin: Secondary | ICD-10-CM

## 2020-12-06 DIAGNOSIS — I10 Essential (primary) hypertension: Secondary | ICD-10-CM

## 2020-12-06 DIAGNOSIS — E1122 Type 2 diabetes mellitus with diabetic chronic kidney disease: Secondary | ICD-10-CM

## 2020-12-06 NOTE — Progress Notes (Signed)
Subjective:    Patient ID: Bonnie Chavez, female    DOB: 1934/10/25, 87 y.o.   MRN: [id number]  No chief complaint on file.   HPI Patient was seen today for f/u.  States she has been doing well.  Trying to get A1c lower.  Started on Januvia 50 mg by endocrinology, Dr. Chalmers Cater.  Insulin decreased to 10 units in evening.  Patient has a log of blood sugar readings.  Notes hypoglycemia this morning as blood sugar was 69.  Patient felt jittery and sweaty.  Drank juice to improve blood sugar.  Patient has copies of labs from 11/28/2020 with Dr. Ardis Rowan.  Sodium 139, potassium 4.5, chloride 103 calcium 10.5, BUN 52, creatinine 1.1, hemoglobin 11.5, hematocrit 34.8, WBC 7.3, platelets 187.  Past Medical History:  Diagnosis Date  . Anemia    History of , resolved  . Aortic valve sclerosis   . Carpal tunnel syndrome   . Chicken pox   . Chronic low back pain 02/27/2016   -DDD -seeing [facility]   . Diabetes mellitus    type 2, sees Dr. Chalmers Cater  . Goiter    Dr. Chalmers Cater  . Heart murmur   . History of nuclear stress test 06/2010   dipyridamole; normal pattern of perfusion; ekg negative for ischemia; low risk scan   . Hot flashes   . Hyperlipidemia   . Hyperparathyroidism ([place]) 02/27/2016   -sees endocrinologist, Dr. Chalmers Cater, has seen surgeon as well   . Hypertension   . Migraine    no longer has migraines (80's)  . Mitral valve prolapse   . Neuropathy   . Obesity 02/27/2016  . Osteoarthritis    DDD, sees [facility]  . PMB (postmenopausal bleeding) 02/28/2009   Resolved  . Pneumonia    history of   . Renal insufficiency    [name]   . Vaginal atrophy   . Vitamin D deficiency     Allergies  Allergen Reactions  . Contrast Media [Iodinated Diagnostic Agents] Other (See Comments)    Reaction unknown  . Nsaids Other (See Comments)    Abnormal renal function  . Sensipar [Cinacalcet Hcl]     Pt stated, "Made me feel bloated, gassy and fluid buildup"  . Penicillins Rash and Other (See  Comments)    Pt has taken Keflex without difficulty Has patient had a PCN reaction causing immediate rash, facial/tongue/throat swelling, SOB or lightheadedness with hypotension: yes Has patient had a PCN reaction causing severe rash involving mucus membranes or skin necrosis: no Has patient had a PCN reaction that required hospitalization no Has patient had a PCN reaction occurring within the last 10 years: no If all of the above answers are "NO", then may proceed with Cephalosporin use.    ROS General: Denies fever, chills, night sweats, changes in weight, changes in appetite + hypoglycemia HEENT: Denies headaches, ear pain, changes in vision, rhinorrhea, sore throat CV: Denies CP, palpitations, SOB, orthopnea Pulm: Denies SOB, cough, wheezing GI: Denies abdominal pain, nausea, vomiting, diarrhea, constipation GU: Denies dysuria, hematuria, frequency, vaginal discharge Msk: Denies muscle cramps, joint pains Neuro: Denies weakness, numbness, tingling Skin: Denies rashes, bruising Psych: Denies depression, anxiety, hallucinations      Objective:    Blood pressure 120/62, pulse 60, temperature 98.1 F (36.7 C), temperature source Oral, weight 192 lb 3.2 oz (87.2 kg), SpO2 98 %.   Gen. Pleasant, well-nourished, in no distress, normal affect  HEENT: [place]/AT, face symmetric, conjunctiva clear, no scleral icterus, PERRLA, EOMI,  nares patent without drainage Lungs: no accessory muscle use, CTAB, no wheezes or rales Cardiovascular: RRR, no m/r/g, no peripheral edema. Musculoskeletal: No deformities, no cyanosis or clubbing, normal tone Neuro:  A&Ox3, CN II-XII intact, normal gait Skin:  Warm, no lesions/ rash   Wt Readings from Last 3 Encounters:  12/06/20 192 lb 3.2 oz (87.2 kg)  11/04/20 187 lb 4 oz (84.9 kg)  08/05/20 191 lb (86.6 kg)    Lab Results  Component Value Date   WBC 7.8 12/27/2019   HGB 12.2 12/27/2019   HCT 37.0 12/27/2019   PLT 200.0 12/27/2019   GLUCOSE 138  (H) 11/04/2017   CHOL 168 11/04/2017   TRIG 90.0 11/04/2017   HDL 55.50 11/04/2017   LDLCALC 95 11/04/2017   ALT 15 05/24/2014   AST 25 05/24/2014   NA 142 01/11/2018   K 4.8 01/11/2018   CL 103 11/04/2017   CREATININE 1.7 (A) 01/11/2018   BUN 43 (A) 01/11/2018   CO2 30 11/04/2017   TSH 2.00 01/30/2016   INR 1.62 (H) 10/30/2010   HGBA1C 7.8 (H) 12/27/2019   MICROALBUR 0.9 05/24/2014    Assessment/Plan:  Essential hypertension -Controlled -Continue current medications Bystolic 5 mg twice daily, Lasix, Norvasc 5 mg, lisinopril 10 mg -Continue lifestyle modifications  Type 2 diabetes mellitus with stage 3b chronic kidney disease, with long-term current use of insulin (HCC) -Hemoglobin A1c 8.3% on 11/06/2020 -Continue Januvia 50 mg and humalin 10 units nightly -for continued hypoglycemia discussed decreasing Januvia to 25 mg daily.  Patient also advised to notify Endocrinology for continued hypoglycemia -Continue rosuvastatin and ACEI -Foot and eye exams up to date  F/u in 4-6 months, sooner if needed  Grier Mitts, MD

## 2021-01-29 LAB — HM DIABETES EYE EXAM

## 2021-02-21 ENCOUNTER — Other Ambulatory Visit: Payer: Self-pay

## 2021-02-21 ENCOUNTER — Encounter (INDEPENDENT_AMBULATORY_CARE_PROVIDER_SITE_OTHER): Payer: Self-pay

## 2021-02-21 ENCOUNTER — Ambulatory Visit (HOSPITAL_COMMUNITY): Payer: Medicare Other | Attending: Cardiovascular Disease

## 2021-02-21 DIAGNOSIS — I10 Essential (primary) hypertension: Secondary | ICD-10-CM | POA: Insufficient documentation

## 2021-02-21 DIAGNOSIS — I350 Nonrheumatic aortic (valve) stenosis: Secondary | ICD-10-CM | POA: Diagnosis not present

## 2021-02-21 DIAGNOSIS — E785 Hyperlipidemia, unspecified: Secondary | ICD-10-CM | POA: Insufficient documentation

## 2021-02-21 DIAGNOSIS — I440 Atrioventricular block, first degree: Secondary | ICD-10-CM | POA: Insufficient documentation

## 2021-02-21 LAB — ECHOCARDIOGRAM COMPLETE
AR max vel: 0.88 cm2
AV Area VTI: 0.88 cm2
AV Area mean vel: 0.86 cm2
AV Mean grad: 24 mmHg
AV Peak grad: 40.8 mmHg
Ao pk vel: 3.19 m/s
Area-P 1/2: 3.08 cm2
S' Lateral: 3.3 cm

## 2021-02-27 ENCOUNTER — Telehealth: Payer: Self-pay | Admitting: Cardiovascular Disease

## 2021-02-27 NOTE — Telephone Encounter (Signed)
Patient is returning call to discuss echo results. °

## 2021-02-27 NOTE — Telephone Encounter (Signed)
Pt updated with ECHO results and verbalized understanding.  

## 2021-03-17 ENCOUNTER — Other Ambulatory Visit: Payer: Self-pay | Admitting: Cardiovascular Disease

## 2021-03-25 ENCOUNTER — Other Ambulatory Visit: Payer: Self-pay

## 2021-03-25 ENCOUNTER — Ambulatory Visit: Payer: Medicare Other | Admitting: Cardiovascular Disease

## 2021-03-25 ENCOUNTER — Encounter: Payer: Self-pay | Admitting: Cardiovascular Disease

## 2021-03-25 DIAGNOSIS — E785 Hyperlipidemia, unspecified: Secondary | ICD-10-CM | POA: Diagnosis not present

## 2021-03-25 DIAGNOSIS — I10 Essential (primary) hypertension: Secondary | ICD-10-CM | POA: Diagnosis not present

## 2021-03-25 DIAGNOSIS — I440 Atrioventricular block, first degree: Secondary | ICD-10-CM

## 2021-03-25 DIAGNOSIS — N1832 Chronic kidney disease, stage 3b: Secondary | ICD-10-CM

## 2021-03-25 DIAGNOSIS — I350 Nonrheumatic aortic (valve) stenosis: Secondary | ICD-10-CM | POA: Diagnosis not present

## 2021-03-25 DIAGNOSIS — I5032 Chronic diastolic (congestive) heart failure: Secondary | ICD-10-CM | POA: Diagnosis not present

## 2021-03-25 NOTE — Progress Notes (Signed)
Patient ID: Novis League, female   DOB: 03/06/1934, 87 y.o.   MRN: [id number]      HPI: Renne Cornick is a 87 y.o. female presents to the office today for a 9 month cardiology evaluation.  Ms. Bento has a history of hypertension with documented grade 1 diastolic dysfunction, hyperlipidemia, type 2 diabetes mellitus with renal insufficiency, as well as aortic valve sclerosis without stenosis. An echo Doppler study in 2011 showed an EF of 55%.  In 2011 a nuclear perfusion scan showed normal perfusion and function.  She has a history of a goiter for which she sees [name], [name] for her renal insufficiency, [name] for uterine polyps and fibroids.  She has a history of hyperparathyroidism and saw [name]; she has not had surgery in his undergoing continued surveillance of her calcium levels.  She underwent a five-year follow-up echo Doppler study on 08/23/2015.  This continued to show normal systolic function with mild LVH and grade 1 diastolic dysfunction.  There was a 10 mm mean gradient and 22 mm peak gradient across her moderately calcified aortic valve with a valve area of 1.63 cm.    She has a long-standing history of diabetes mellitus  and sees [name].  She sees [name] for renal insufficiency.  I reviewed her recent blood work from his office 03/09/2017 which showed her creatinine had improved to 1.5.  In March 2018 Cr was 1.63 and in December 2017 Cr was 1.83.    When I saw her in May 2018, she had had subsequent blood work which had revealed improvement in creatinine to 1.5.  I recommended she undergo a 2 year follow-up echo Doppler evaluation of her aortic stenosis.  This was done on 08/10/2007 and showed an EF of 65-70%.  Her mean aortic gradient has increased to 16 and her peak gradient 30.  There was grade 1 diastolic dysfunction, mild MR, mild LA dilation and mild TR.  Her estimated aortic valve area is 1.58 cm.  She denied any chest pain, presyncope or syncope.     I saw her in October 2018.  She has continued to be followed by [name] of endocrinology and [name] of nephrology.  She has renal insufficiency and her creatinine in August 2018 was 1.7.  Her blood pressure was stable and I recommended reduction of her furosemide to 20 mg from her prior dose of 40 mg.  Any chest pain.  She denies presyncope or syncope.  She tells me her recent hemoglobin A1c was 7.6.  She is  followed by [name] of endocrinology and [name] of nephrology. A repeat creatinine in August 2018 was 1.7.    I last saw her in the office for evaluation in November 2019.  At that time she denied any chest pain, shortness of breath, presyncope or syncope.  She underwent a repeat echo Doppler study on September 02, 2018 which continued to show normal systolic function with an EF of 55 to 24%, grade 1 diastolic dysfunction, moderate aortic stenosis with a mean gradient of 23 and peak gradient of 37 and mitral annular calcification.  Laboratory from [name] in October 2019 revealed a creatinine of 1.64.    She had a telemedicine evaluation with [name], her primary physician in March 2020 and saw [name]  and had repeat renal function testing.    I  evaluated her on May 12, 2019 in a telemedicine visit. She had remained asymptomatic and specifically  denied any chest pain PND orthopnea, presyncope or syncope.  She denies any palpitations.  At times she notes trivial left ankle edema.    She underwent an echo Doppler study on November 06, 2019 which continued to show an EF of 60 to 65%.  There were no regional wall motion abnormalities.  There was moderate LVH and asymmetric left ventricular hypertrophy.  There was moderate aortic stenosis with a mean gradient of 19 and peak gradient of 36.2.  There was mild PR, trivial MR and TR. I last saw her on November 29, 2019.  At that time she felt well and admitted to just mild episodes of shortness of breath particularly  with walking uphill.  She has had issues with arthritis.  She has continued to see [name] for endocrinologic care and now sees [name] for nephrology since Dr. [name] is about to retire.   I last saw her in August 2021.  She underwent laboratory on June 24, 2020 by [name] which showed a creatinine of 1.42.  She had normal LFTs.  TSH was 1.34.  Total cholesterol 160, triglycerides 74, HDL 48, LDL cholesterol 98.  Laboratory by [name] has shown mild vitamin D insufficiency with a vitamin D level of 23 in April 2021.  Creatinine at that time was further increased at 1.74 which subsequently improved.  She denies any chest tightness.  She denied any dizziness presyncope or syncope.  She continued to experience some mild shortness of breath with uphill walking with this without change.  During that evaluation her blood pressure was stable on amlodipine 5 mg, Bystolic 5 mg twice a day, furosemide 40 mg, and lisinopril 10 mg.  She continued to be on rosuvastatin 40 mg for hyperlipidemia.  She was being followed by [name] for her stage III chronic kidney disease.  I recommended that she undergo a follow-up echo Doppler study for reassessment of her aortic valve prior to her next cardiac evaluation.  On February 21, 2021, her most recent echo Doppler study showed normal LV function with EF 60 to 65%, moderate left ventricular hypertrophy and grade 2 diastolic dysfunction.  She had elevated left atrial pressure.  She was felt to have at least moderate least severe aortic stenosis.  Her mean gradient was 24 mm, peak gradient 40.8 mm and aortic valve area at 0.9 cm.  Presently, she remains fairly active but has experienced some mild shortness of breath with uphill walking.  She typically walks with a cane.  She is unaware of any presyncope or syncope.  She denies any leg edema.  She had recently seen [name] of nephrology and laboratory was obtained in January 2022.  Creatinine was 1.51  with her estimated GFR at 36 consistent with stage IIIb CKD.  She presents for evaluation  Past Medical History:  Diagnosis Date  . Anemia    History of , resolved  . Aortic valve sclerosis   . Carpal tunnel syndrome   . Chicken pox   . Chronic low back pain 02/27/2016   -DDD -seeing [facility]   . Diabetes mellitus    type 2, sees Dr. Chalmers Cater  . Goiter    Dr. Chalmers Cater  . Heart murmur   . History of nuclear stress test 06/2010   dipyridamole; normal pattern of perfusion; ekg negative for ischemia; low risk scan   . Hot flashes   . Hyperlipidemia   . Hyperparathyroidism ([place]) 02/27/2016   -sees endocrinologist, Dr. Chalmers Cater, has seen  surgeon as well   . Hypertension   . Migraine    no longer has migraines (80's)  . Mitral valve prolapse   . Neuropathy   . Obesity 02/27/2016  . Osteoarthritis    DDD, sees [facility]  . PMB (postmenopausal bleeding) 02/28/2009   Resolved  . Pneumonia    history of   . Renal insufficiency    [name]   . Vaginal atrophy   . Vitamin D deficiency     Past Surgical History:  Procedure Laterality Date  . [place] &1976  . CHOLECYSTECTOMY  1991  . COLONOSCOPY    . DILATATION & CURRETTAGE/HYSTEROSCOPY WITH RESECTOCOPE N/A 05/15/2013   Procedure: [place];  Surgeon: Eldred Manges, MD;  Location: [facility];  Service: Gynecology;  Laterality: N/A;  . DILATATION & CURRETTAGE/HYSTEROSCOPY WITH RESECTOCOPE N/A 06/03/2017   Procedure: DILATATION & CURETTAGE/HYSTEROSCOPY;  Surgeon: Eldred Manges, MD;  Location: [facility];  Service: Gynecology;  Laterality: N/A;  . DILATION AND CURETTAGE OF UTERUS  1975  . [place]  . KNEE SURGERY  2000 2011   right knee replacement   . REPLACEMENT TOTAL KNEE  2000   left  . SHOULDER SURGERY  2004 2010   . TRANSTHORACIC ECHOCARDIOGRAM  07/2010   EF=>55%; LA mildly dilated; trace MR/TR;  . TUBAL LIGATION  1976    Allergies  Allergen  Reactions  . Contrast Media [Iodinated Diagnostic Agents] Other (See Comments)    Reaction unknown  . Nsaids Other (See Comments)    Abnormal renal function  . Sensipar [Cinacalcet Hcl]     Pt stated, "Made me feel bloated, gassy and fluid buildup"  . Penicillins Rash and Other (See Comments)    Pt has taken Keflex without difficulty Has patient had a PCN reaction causing immediate rash, facial/tongue/throat swelling, SOB or lightheadedness with hypotension: yes Has patient had a PCN reaction causing severe rash involving mucus membranes or skin necrosis: no Has patient had a PCN reaction that required hospitalization no Has patient had a PCN reaction occurring within the last 10 years: no If all of the above answers are "NO", then may proceed with Cephalosporin use.    Current Outpatient Medications  Medication Sig Dispense Refill  . acetaminophen (TYLENOL) 500 MG tablet Take 1 tablet (500 mg total) by mouth every 4 (four) hours as needed for moderate pain. 60 tablet 0  . amLODipine (NORVASC) 5 MG tablet Take 1 tablet (5 mg total) by mouth daily.    [name] Kitchen aspirin 81 MG EC tablet Take 81 mg by mouth at bedtime. Swallow whole.    . BD PEN NEEDLE NANO 2ND GEN 32G X 4 MM MISC USE AS DIRECTED 90    . BYSTOLIC 10 MG tablet TAKE 1/2 TABLET BY MOUTH TWICE A DAY 90 tablet 1  . cephALEXin (KEFLEX) 500 MG capsule Take 500-1,000 mg by mouth See admin instructions. TAKE ONLY FOR INVASIVE DENTAL PROCEDURES    . cinacalcet (SENSIPAR) 30 MG tablet 1 TABLET MON, WED, FRI, TAKE WITH LARGEST MEAL OF THE DAY    . furosemide (LASIX) 20 MG tablet Take 40 mg by mouth daily. Can take additional tablet if needed 30 tablet   . gabapentin (NEURONTIN) 100 MG capsule Take 100 mg by mouth at bedtime.    [name] Kitchen HUMULIN N KWIKPEN 100 UNIT/ML Kiwkpen Inject 12 Units into the skin at bedtime.   6  . levothyroxine (SYNTHROID) 50 MCG tablet Take 50  mcg by mouth daily before breakfast.    . lisinopril (ZESTRIL) 10 MG tablet TAKE 1  TABLET BY MOUTH EVERY DAY 90 tablet 2  . OneTouch Delica Lancets 95M MISC Apply topically.    Glory Rosebush VERIO test strip 1 each 2 (two) times daily.    . rosuvastatin (CRESTOR) 40 MG tablet TAKE 1 TABLET BY MOUTH AT BEDTIME 90 tablet 2   No current facility-administered medications for this visit.    Social History   Socioeconomic History  . Marital status: Divorced    Spouse name: Not on file  . Number of children: 2  . Years of education: Not on file  . Highest education level: Not on file  Occupational History  . Not on file  Tobacco Use  . Smoking status: Never Smoker  . Smokeless tobacco: Never Used  Substance and Sexual Activity  . Alcohol use: No    Alcohol/week: 0.0 standard drinks  . Drug use: No  . Sexual activity: Not on file  Other Topics Concern  . Not on file  Social History Narrative   Work or School: none      Home Situation: lives alone, feels safe, son lives nearby      Spiritual Beliefs: Christian      Lifestyle: Y 3x per week, healthy diet       01/05/2019: Lives alone in one level house.   Goes to [employer] to do water aerobics 3X/week   Has son and daughter, son local      Social Determinants of Health   Financial Resource Strain: Low Risk   . Difficulty of Paying Living Expenses: Not hard at all  Food Insecurity: No Food Insecurity  . Worried About Charity fundraiser in the Last Year: Never true  . Ran Out of Food in the Last Year: Never true  Transportation Needs: No Transportation Needs  . Lack of Transportation (Medical): No  . Lack of Transportation (Non-Medical): No  Physical Activity: Insufficiently Active  . Days of Exercise per Week: 2 days  . Minutes of Exercise per Session: 60 min  Stress: No Stress Concern Present  . Feeling of Stress : Not at all  Social Connections: Moderately Isolated  . Frequency of Communication with Friends and Family: More than three times a week  . Frequency of Social Gatherings with Friends and Family:  More than three times a week  . Attends Religious Services: More than 4 times per year  . Active Member of Clubs or Organizations: No  . Attends Archivist Meetings: Never  . Marital Status: Divorced  Human resources officer Violence: Not At Risk  . Fear of Current or Ex-Partner: No  . Emotionally Abused: No  . Physically Abused: No  . Sexually Abused: No   Social history is notable that she's divorced has 2 children one grandchild. She does exercise. There is no tobacco or alcohol use..  Family History  Problem Relation Age of Onset  . Hypertension Mother   . Diabetes Mother   . Hyperlipidemia Mother   . Heart attack Mother   . Heart disease Father   . Heart attack Father   . Stroke Brother   . Heart disease Brother   . Diabetes Brother   . Uterine cancer Maternal Grandmother     ROS General: Negative; No fevers, chills, or night sweats; No significant change in weight HEENT: Negative; No changes in vision or hearing, sinus congestion, difficulty swallowing Pulmonary: Negative; No cough, wheezing, shortness of  breath, hemoptysis Cardiovascular: See history of present illness Previous leg swelling had resolved  GI: Negative; No nausea, vomiting, diarrhea, or abdominal pain GU: Negative; No dysuria, hematuria, or difficulty voiding Musculoskeletal: Negative; no myalgias, joint pain, or weakness Hematologic/Oncology: Negative; no easy bruising, bleeding Endocrine: Positive for diabetes mellitus, positive for hyperparathyroidism Neuro: Negative; no changes in balance, headaches Skin: Negative; No rashes or skin lesions Psychiatric: Negative; No behavioral problems, depression Sleep: Negative; No snoring, daytime sleepiness, hypersomnolence, bruxism, restless legs, hypnogognic hallucinations, no cataplexy Other comprehensive 14 point system review is negative.   PE BP 130/60   Pulse (!) 58   Ht $R'5\' 4"'qz$  (1.626 m)   Wt 190 lb 12.8 oz (86.5 kg)   SpO2 97%   BMI 32.75  kg/m    Repeat blood pressure by me was 130/64  Wt Readings from Last 3 Encounters:  03/25/21 190 lb 12.8 oz (86.5 kg)  12/06/20 192 lb 3.2 oz (87.2 kg)  11/04/20 187 lb 4 oz (84.9 kg)   General: Alert, oriented, no distress.  Skin: normal turgor, no rashes, warm and dry HEENT: Normocephalic, atraumatic. Pupils equal round and reactive to light; sclera anicteric; extraocular muscles intact;  Nose without nasal septal hypertrophy Mouth/Parynx benign; Mallinpatti scale 3 Neck: No JVD, no carotid bruits; normal carotid upstroke Lungs: clear to ausculatation and percussion; no wheezing or rales Chest wall: without tenderness to palpitation Heart: PMI not displaced, RRR, s1 s2 normal, 2/6 mid peaking systolic murmur, no diastolic murmur, no rubs, gallops, thrills, or heaves Abdomen: soft, nontender; no hepatosplenomehaly, BS+; abdominal aorta nontender and not dilated by palpation. Back: no CVA tenderness Pulses 2+ Musculoskeletal: full range of motion, normal strength, no joint deformities Extremities: no clubbing cyanosis or edema, Homan's sign negative  Neurologic: grossly nonfocal; Cranial nerves grossly wnl Psychologic: Normal mood and affect  August 2021 ECG (independently read by me): Normal sinus rhythm at 60 bpm.  First-degree AV block with a PR interval at 258 ms.  LVH by voltage in aVL.  No ectopy.  January 2021 ECG (independently read by me): Normal sinus rhythm at 61 bpm with first-degree AV block; PR interval 258 ms.  LVH by voltage criteria in aVL.    November 2019 ECG (independently read by me): Normal sinus rhythm at 61 bpm.  First-degree AV block.  LVH by voltage criteria.  October 2018 ECG (independently read by me): Sinus bradycardia 57 bpm with first-degree AV block.  LVH by voltage criteria.  PR interval 270 ms.  May 2018 ECG (independently read by me): sinus rhythm with first-degree AV block.  LVH by voltage criteria.  PR interval 250 ms.  April 2017 ECG  (independently read by me): Normal sinus rhythm at 61 bpm with first-degree AV block with a PR interval of 244 ms.  LVH by voltage.  September 2016 ECG (independently read by me): Normal sinus rhythm at 60 bpm.  First degree block with a PR interval at 252 ms.  No significant ST-T changes.  October 2015 ECG (independently read by me): Sinus bradycardia at 57 beats per minute.  First degree AV block with a PR interval of 236 ms   September 2014 ECG: Sinus rhythm at 60 beats per minute. First grade AV block with PR interval of 244 ms. QTC interval 356 ms  LABS: BMP Latest Ref Rng & Units 01/11/2018 11/04/2017 05/25/2017  Glucose 70 - 99 mg/dL - 138(H) 256(H)  BUN 4 - 21 43(A) 36(H) 32(H)  Creatinine 0.5 - 1.1 1.7(A) 1.67(H) 1.67(H)  Sodium 137 - 147 142 140 136  Potassium 3.4 - 5.3 4.8 4.4 4.1  Chloride 96 - 112 mEq/L - 103 100(L)  CO2 19 - 32 mEq/L - 30 30  Calcium 8.4 - 10.5 mg/dL - 10.8(H) 10.9(H)   Hepatic Function Latest Ref Rng & Units 05/24/2014 01/23/2014 10/25/2013  Total Protein 6.0 - 8.3 g/dL 8.0 7.8 7.9  Albumin 3.5 - 5.2 g/dL 4.0 3.9 3.8  AST 0 - 37 U/L $Remo'25 23 26  'eMbAH$ ALT 0 - 35 U/L $Remo'15 16 22  'KPmUv$ Alk Phosphatase 39 - 117 U/L 44 43 46  Total Bilirubin 0.2 - 1.2 mg/dL 0.6 0.5 0.5  Bilirubin, Direct 0.0 - 0.3 mg/dL 0.1 0.0 0.0   CBC Latest Ref Rng & Units 12/27/2019 11/04/2017 05/25/2017  WBC 4.0 - 10.5 K/uL 7.8 7.3 6.8  Hemoglobin 12.0 - 15.0 g/dL 12.2 12.3 12.1  Hematocrit 36.0 - 46.0 % 37.0 36.8 36.3  Platelets 150.0 - 400.0 K/uL 200.0 207.0 203   Lab Results  Component Value Date   MCV 96.1 12/27/2019   MCV 98.8 11/04/2017   MCV 98.1 05/25/2017   Lab Results  Component Value Date   TSH 2.00 01/30/2016   Lab Results  Component Value Date   HGBA1C 7.8 (H) 12/27/2019   Lab Results  Component Value Date   CALCIUM 10.8 (H) 11/04/2017   Lipid Panel     Component Value Date/Time   CHOL 168 11/04/2017 0819   TRIG 90.0 11/04/2017 0819   HDL 55.50 11/04/2017 0819   CHOLHDL 3  11/04/2017 0819   VLDL 18.0 11/04/2017 0819   LDLCALC 95 11/04/2017 0819    RADIOLOGY: No results found.  IMPRESSION:  1. Nonrheumatic aortic valve stenosis   2. Essential hypertension   3. Hyperlipidemia with target LDL less than 70   4. First degree AV block   5. Chronic diastolic congestive heart failure (HCC)   6. Stage 3b chronic kidney disease ([place])     ASSESSMENT AND PLAN: Ms. Plante is a 87 year old African-American female who has a long-standing history of hypertension with grade 1 diastolic dysfunction and normal systolic function documented by her echocardiogram in September 2011. At that time she had aortic valve sclerosis without stenosis and  mitral annular calcification with trace MR and trace TR. over the years she has had progression of her aortic valve disease.  Her echo Doppler study from 08/23/2015 showed an ejection fraction of 55-60%, mild LVH and grade 1 diastolic dysfunction.  There was very mild aortic stenosis with a mean gradient of 10 and peak gradient of 22.  A 2-year follow-up assessment 08/09/2017 continued to show hyperdynamic LV function with slight increase in her mean gradient at 16 mm with peak of 30 and her October 2019 echo demonstrated mild increase in her gradient with a mean gradient of 23 and peak gradient of 37 mmHg. Her most recent echo of November 06, 2019 revealed  normal systolic function and essentially was unchanged with reference to the severity of aortic stenosis.  Her mean gradient was 19 with peak gradient 36.2.  Her most recent a echo of April 2022 now shows a mean gradient of 24 mm with a peak gradient of 40.8 mmHg and AVA at 0.9 cm.  She predominantly is asymptomatic but recently has noticed some mild development of shortness of breath if she has to walk uphill or fast.  She is 87 years old and is now walking with a cane.  Her blood pressure today is stable  on amlodipine 5 mg, Bystolic 10 mg daily for which she takes 5 twice daily,  furosemide 40 mg, lisinopril 10 mg daily.  She is followed by nephrology and has stage IIIb CKD with most recent creatinine at 1.51.  She is on rosuvastatin 40 mg for hyperlipidemia.  LDL cholesterol in August 2021 was 98 when checked by Grier Mitts.  She will be undergoing repeat laboratory and follow-up evaluation in several weeks.  Her ECG is stable with previously noted first-degree AV block.  She is on levothyroxine for hypothyroidism.  She is diabetic on insulin.  I reviewed her echo Doppler data with her in detail.  I have recommended close observation.  We discussed potential future TAVR assessment if progressive AS continues.  I have recommended a follow-up echo Doppler study in January 2023 with follow-up office visit.    Troy Sine, MD, [facility]  03/27/2021 11:16 AM

## 2021-03-25 NOTE — Patient Instructions (Signed)
Medication Instructions:  Continue same medications *If you need a refill on your cardiac medications before your next appointment, please call your pharmacy*   Lab Work: None ordered   Testing/Procedures: Echo to be scheduled in January 2023   Follow-Up: At [facility], you and your health needs are our priority.  As part of our continuing mission to provide you with exceptional heart care, we have created designated Provider Care Teams.  These Care Teams include your primary Cardiologist (physician) and Advanced Practice Providers (APPs -  Physician Assistants and Nurse Practitioners) who all work together to provide you with the care you need, when you need it.  We recommend signing up for the patient portal called "MyChart".  Sign up information is provided on this After Visit Summary.  MyChart is used to connect with patients for Virtual Visits (Telemedicine).  Patients are able to view lab/test results, encounter notes, upcoming appointments, etc.  Non-urgent messages can be sent to your provider as well.   To learn more about what you can do with MyChart, go to [email or web address].    Your next appointment:  January 2023   Call in Oct to schedule January appointment    The format for your next appointment:  Office     Provider:  [facility]

## 2021-03-27 ENCOUNTER — Encounter: Payer: Self-pay | Admitting: Cardiovascular Disease

## 2021-04-07 ENCOUNTER — Other Ambulatory Visit: Payer: Self-pay

## 2021-04-07 ENCOUNTER — Ambulatory Visit: Payer: Medicare Other | Admitting: Family Medicine

## 2021-04-07 ENCOUNTER — Encounter: Payer: Self-pay | Admitting: Family Medicine

## 2021-04-07 VITALS — BP 146/72 | HR 61 | Temp 98.2°F | Wt 194.8 lb

## 2021-04-07 DIAGNOSIS — E1122 Type 2 diabetes mellitus with diabetic chronic kidney disease: Secondary | ICD-10-CM | POA: Diagnosis not present

## 2021-04-07 DIAGNOSIS — Z794 Long term (current) use of insulin: Secondary | ICD-10-CM

## 2021-04-07 DIAGNOSIS — N1832 Chronic kidney disease, stage 3b: Secondary | ICD-10-CM | POA: Diagnosis not present

## 2021-04-07 DIAGNOSIS — E782 Mixed hyperlipidemia: Secondary | ICD-10-CM

## 2021-04-07 DIAGNOSIS — I10 Essential (primary) hypertension: Secondary | ICD-10-CM

## 2021-04-07 NOTE — Progress Notes (Signed)
Subjective:    Patient ID: Bonnie Chavez, female    DOB: 07-26-1934, 87 y.o.   MRN: [id number]  Chief Complaint  Patient presents with  . Follow-up    HPI Patient was seen today for f/u.  Pt seen by Nephorology in Jan 2022, had labs.  Has upcoming appt.  Pt notes the cold weather at the beginning of the year caused increased joint pain.  Patient doing daily exercises by hand pain.  Patient had recent echo and follow-up with cardiology.  FSBS at home typically in the 90s-1 teens, has log.  Patient followed by Dr. Chalmers Cater for DM and hypothyroidism. Hemoglobin A1c was 7.3% on 03/21/2021.  Patient notes she may need a new BP monitor.  Had a few readings with diastolic in the 52D.  Patient did not feel dizzy or bad at the time.  Other readings typically 120s-130s with a few 782U systolic, has log.  Past Medical History:  Diagnosis Date  . Anemia    History of , resolved  . Aortic valve sclerosis   . Carpal tunnel syndrome   . Chicken pox   . Chronic low back pain 02/27/2016   -DDD -seeing [facility]   . Diabetes mellitus    type 2, sees Dr. Chalmers Cater  . Goiter    Dr. Chalmers Cater  . Heart murmur   . History of nuclear stress test 06/2010   dipyridamole; normal pattern of perfusion; ekg negative for ischemia; low risk scan   . Hot flashes   . Hyperlipidemia   . Hyperparathyroidism ([place]) 02/27/2016   -sees endocrinologist, Dr. Chalmers Cater, has seen surgeon as well   . Hypertension   . Migraine    no longer has migraines (80's)  . Mitral valve prolapse   . Neuropathy   . Obesity 02/27/2016  . Osteoarthritis    DDD, sees [facility]  . PMB (postmenopausal bleeding) 02/28/2009   Resolved  . Pneumonia    history of   . Renal insufficiency    [name]   . Vaginal atrophy   . Vitamin D deficiency     Allergies  Allergen Reactions  . Contrast Media [Iodinated Diagnostic Agents] Other (See Comments)    Reaction unknown  . Nsaids Other (See Comments)    Abnormal renal function  . Sensipar  [Cinacalcet Hcl]     Pt stated, "Made me feel bloated, gassy and fluid buildup"  . Penicillins Rash and Other (See Comments)    Pt has taken Keflex without difficulty Has patient had a PCN reaction causing immediate rash, facial/tongue/throat swelling, SOB or lightheadedness with hypotension: yes Has patient had a PCN reaction causing severe rash involving mucus membranes or skin necrosis: no Has patient had a PCN reaction that required hospitalization no Has patient had a PCN reaction occurring within the last 10 years: no If all of the above answers are "NO", then may proceed with Cephalosporin use.    ROS General: Denies fever, chills, night sweats, changes in weight, changes in appetite HEENT: Denies headaches, ear pain, changes in vision, rhinorrhea, sore throat CV: Denies CP, palpitations, SOB, orthopnea Pulm: Denies SOB, cough, wheezing GI: Denies abdominal pain, nausea, vomiting, diarrhea, constipation GU: Denies dysuria, hematuria, frequency, vaginal discharge Msk: Denies muscle cramps, joint pains Neuro: Denies weakness, numbness, tingling Skin: Denies rashes, bruising Psych: Denies depression, anxiety, hallucinations     Objective:    Blood pressure (!) 146/72, pulse 61, temperature 98.2 F (36.8 C), temperature source Oral, weight 194 lb 12.8 oz (88.4 kg),  SpO2 99 %.   Gen. Pleasant, well-nourished, in no distress, normal affect  HEENT: [place]/AT, face symmetric, conjunctiva clear, no scleral icterus, PERRLA, EOMI, nares patent without drainage Lungs: no accessory muscle use, CTAB, no wheezes or rales Cardiovascular: RRR,  3/6 murmur best heard in upper R chest. Trace edema b/l L >R Abdomen: BS present, soft, NT/ND, no hepatosplenomegaly. Musculoskeletal: No deformities, no cyanosis or clubbing, normal tone Neuro:  A&Ox3, CN II-XII intact, normal gait Skin:  Warm, no lesions/ rash  B/l hands with smooth appearing hyperpigmented skin on dorsum of hands near thumbs.   Wt  Readings from Last 3 Encounters:  04/07/21 194 lb 12.8 oz (88.4 kg)  03/25/21 190 lb 12.8 oz (86.5 kg)  12/06/20 192 lb 3.2 oz (87.2 kg)    Lab Results  Component Value Date   WBC 7.8 12/27/2019   HGB 12.2 12/27/2019   HCT 37.0 12/27/2019   PLT 200.0 12/27/2019   GLUCOSE 138 (H) 11/04/2017   CHOL 168 11/04/2017   TRIG 90.0 11/04/2017   HDL 55.50 11/04/2017   LDLCALC 95 11/04/2017   ALT 15 05/24/2014   AST 25 05/24/2014   NA 142 01/11/2018   K 4.8 01/11/2018   CL 103 11/04/2017   CREATININE 1.7 (A) 01/11/2018   BUN 43 (A) 01/11/2018   CO2 30 11/04/2017   TSH 2.00 01/30/2016   INR 1.62 (H) 10/30/2010   HGBA1C 7.8 (H) 12/27/2019   MICROALBUR 0.9 05/24/2014    Assessment/Plan:  Essential hypertension -Elevated -Lifestyle modifications encouraged -Continue current medications including Bystolic 5 mg twice daily, Norvasc 5 mg daily, lisinopril 10 mg -Pt to obtain new BP cuff.  Monitoring BP at home for consistent elevation notify clinic.  -Continue follow-up with Cardiology  Type 2 diabetes mellitus with stage 3b chronic kidney disease, with long-term current use of insulin (HCC) -Hemoglobin A1c 7.3% on 03/21/2021 -Continue lifestyle modifications -Continue current medications including Humulin 70/30 KwikPen 10 units every evening -Continue follow-up with Endocrinology, Dr. Chalmers Cater  Mixed hyperlipidemia -Stable -Continue Crestor 40 mg daily -Continue lifestyle modifications -Continue follow-up with Cardiology  Stage 3b chronic kidney disease ([place]) -Labs from 11/26/2020 reviewed as provided by patient.  Calcium 10.5, creatinine 1.51, ferritin 254, other iron studies normal. -Renally dose medications -Avoid nephrotoxic medications -Continue follow-up with Nephrology, Dr. Weber Cooks  F/u prn in 3-4 months  Grier Mitts, MD

## 2021-04-07 NOTE — Patient Instructions (Signed)
Managing Your Hypertension Hypertension, also called high blood pressure, is when the force of the blood pressing against the walls of the arteries is too strong. Arteries are blood vessels that carry blood from your heart throughout your body. Hypertension forces the heart to work harder to pump blood and may cause the arteries to become narrow or stiff. Understanding blood pressure readings Your personal target blood pressure may vary depending on your medical conditions, your age, and other factors. A blood pressure reading includes a higher number over a lower number. Ideally, your blood pressure should be below 120/80. You should know that:  The first, or top, number is called the systolic pressure. It is a measure of the pressure in your arteries as your heart beats.  The second, or bottom number, is called the diastolic pressure. It is a measure of the pressure in your arteries as the heart relaxes. Blood pressure is classified into four stages. Based on your blood pressure reading, your health care provider may use the following stages to determine what type of treatment you need, if any. Systolic pressure and diastolic pressure are measured in a unit called mmHg. Normal  Systolic pressure: below 120.  Diastolic pressure: below 80. Elevated  Systolic pressure: 120-129.  Diastolic pressure: below 80. Hypertension stage 1  Systolic pressure: 130-139.  Diastolic pressure: 80-89. Hypertension stage 2  Systolic pressure: 140 or above.  Diastolic pressure: 90 or above. How can this condition affect me? Managing your hypertension is an important responsibility. Over time, hypertension can damage the arteries and decrease blood flow to important parts of the body, including the brain, heart, and kidneys. Having untreated or uncontrolled hypertension can lead to:  A heart attack.  A stroke.  A weakened blood vessel (aneurysm).  Heart failure.  Kidney damage.  Eye  damage.  Metabolic syndrome.  Memory and concentration problems.  Vascular dementia. What actions can I take to manage this condition? Hypertension can be managed by making lifestyle changes and possibly by taking medicines. Your health care provider will help you make a plan to bring your blood pressure within a normal range. Nutrition  Eat a diet that is high in fiber and potassium, and low in salt (sodium), added sugar, and fat. An example eating plan is called the Dietary Approaches to Stop Hypertension (DASH) diet. To eat this way: ? Eat plenty of fresh fruits and vegetables. Try to fill one-half of your plate at each meal with fruits and vegetables. ? Eat whole grains, such as whole-wheat pasta, brown rice, or whole-grain bread. Fill about one-fourth of your plate with whole grains. ? Eat low-fat dairy products. ? Avoid fatty cuts of meat, processed or cured meats, and poultry with skin. Fill about one-fourth of your plate with lean proteins such as fish, chicken without skin, beans, eggs, and tofu. ? Avoid pre-made and processed foods. These tend to be higher in sodium, added sugar, and fat.  Reduce your daily sodium intake. Most people with hypertension should eat less than 1,500 mg of sodium a day.   Lifestyle  Work with your health care provider to maintain a healthy body weight or to lose weight. Ask what an ideal weight is for you.  Get at least 30 minutes of exercise that causes your heart to beat faster (aerobic exercise) most days of the week. Activities may include walking, swimming, or biking.  Include exercise to strengthen your muscles (resistance exercise), such as weight lifting, as part of your weekly exercise routine. Try   to do these types of exercises for 30 minutes at least 3 days a week.  Do not use any products that contain nicotine or tobacco, such as cigarettes, e-cigarettes, and chewing tobacco. If you need help quitting, ask your health care  provider.  Control any long-term (chronic) conditions you have, such as high cholesterol or diabetes.  Identify your sources of stress and find ways to manage stress. This may include meditation, deep breathing, or making time for fun activities.   Alcohol use  Do not drink alcohol if: ? Your health care provider tells you not to drink. ? You are pregnant, may be pregnant, or are planning to become pregnant.  If you drink alcohol: ? Limit how much you use to:  0-1 drink a day for women.  0-2 drinks a day for men. ? Be aware of how much alcohol is in your drink. In the [place] one drink equals one 12 oz bottle of beer (355 mL), one 5 oz glass of wine (148 mL), or one 1 oz glass of hard liquor (44 mL). Medicines Your health care provider may prescribe medicine if lifestyle changes are not enough to get your blood pressure under control and if:  Your systolic blood pressure is 130 or higher.  Your diastolic blood pressure is 80 or higher. Take medicines only as told by your health care provider. Follow the directions carefully. Blood pressure medicines must be taken as told by your health care provider. The medicine does not work as well when you skip doses. Skipping doses also puts you at risk for problems. Monitoring Before you monitor your blood pressure:  Do not smoke, drink caffeinated beverages, or exercise within 30 minutes before taking a measurement.  Use the bathroom and empty your bladder (urinate).  Sit quietly for at least 5 minutes before taking measurements. Monitor your blood pressure at home as told by your health care provider. To do this:  Sit with your back straight and supported.  Place your feet flat on the floor. Do not cross your legs.  Support your arm on a flat surface, such as a table. Make sure your upper arm is at heart level.  Each time you measure, take two or three readings one minute apart and record the results. You may also need to have your  blood pressure checked regularly by your health care provider.   General information  Talk with your health care provider about your diet, exercise habits, and other lifestyle factors that may be contributing to hypertension.  Review all the medicines you take with your health care provider because there may be side effects or interactions.  Keep all visits as told by your health care provider. Your health care provider can help you create and adjust your plan for managing your high blood pressure. Where to find more information  National Heart, Lung, and Blood Institute: www.nhlbi.nih.gov  American Heart Association: www.heart.org Contact a health care provider if:  You think you are having a reaction to medicines you have taken.  You have repeated (recurrent) headaches.  You feel dizzy.  You have swelling in your ankles.  You have trouble with your vision. Get help right away if:  You develop a severe headache or confusion.  You have unusual weakness or numbness, or you feel faint.  You have severe pain in your chest or abdomen.  You vomit repeatedly.  You have trouble breathing. These symptoms may represent a serious problem that is an emergency. Do not wait   to see if the symptoms will go away. Get medical help right away. Call your local emergency services (911 in the [place] Do not drive yourself to the hospital. Summary  Hypertension is when the force of blood pumping through your arteries is too strong. If this condition is not controlled, it may put you at risk for serious complications.  Your personal target blood pressure may vary depending on your medical conditions, your age, and other factors. For most people, a normal blood pressure is less than 120/80.  Hypertension is managed by lifestyle changes, medicines, or both.  Lifestyle changes to help manage hypertension include losing weight, eating a healthy, low-sodium diet, exercising more, stopping smoking, and  limiting alcohol. This information is not intended to replace advice given to you by your health care provider. Make sure you discuss any questions you have with your health care provider. Document Revised: 12/15/2019 Document Reviewed: 10/10/2019 Elsevier Patient Education  2021 [place] for Chronic Kidney Disease Chronic kidney disease (CKD) occurs when the kidneys are permanently damaged over a long period of time. When your kidneys are not working well, they cannot remove waste, fluids, and other substances from your blood as well as they did before. The substances can build up, which can worsen kidney damage and affect how your body functions. Certain foods lead to a buildup of these substances. By changing your diet, you can help prevent more kidney damage and delay or prevent the need for dialysis. What are tips for following this plan? Reading food labels  Check the amount of salt (sodium) in foods. Choose foods that have less than 300 milligrams (mg) per serving.  Check the ingredient list for phosphorus or potassium-based additives or preservatives.  Check the amount of saturated fat and trans fat. Limit or avoid these fats as told by your dietitian. Shopping  Avoid buying foods that are: ? Processed or prepackaged. ? Calcium-enriched or that have calcium added to them (are fortified).  Do not buy foods that have salt or sodium listed among the first five ingredients.  Buy canned vegetables and beans that say "no salt added" or "low sodium" and rinse them before eating. Cooking  Soak vegetables, such as potatoes, before cooking to reduce potassium. To do this: 1. Peel and cut the vegetables into small pieces. 2. Soak the vegetables in warm water for at least 2 hours. For every 1 cup of vegetables, use 10 cups of water. 3. Drain and rinse the vegetables with warm water. 4. Boil the vegetables for at least 5 minutes. Meal planning  Limit the amount of  protein you eat from plant and animal sources each day.  Do not add salt to food when cooking or before eating.  Eat meals and snacks at around the same time each day. General information  Talk with your health care provider about whether you should take a vitamin and mineral supplement.  Use standard measuring cups and spoons to measure servings of foods. Use a kitchen scale to measure portions of protein foods.  If told by your health care provider, avoid drinking too much fluid. Measure and count all liquids, including water, ice, soups, flavored gelatin, and frozen desserts such as ice pops or ice cream. If you have diabetes:  If you have diabetes (diabetes mellitus) and CKD, it is important to keep your blood sugar (glucose) in the target range recommended by your health care provider. Follow your diabetes management plan. This may include: ? Checking your  blood glucose regularly. ? Taking medicines by mouth, taking insulin, or taking both. ? Exercising for at least 30 minutes on 5 or more days each week, or as told by your health care provider. ? Tracking how many servings of carbohydrates you eat at each meal.  You may be given specific guidelines on how much of certain foods and nutrients you may eat, depending on your stage of kidney disease and whether you have high blood pressure (hypertension). Follow your meal plan as told by your dietitian. What nutrients should I limit? Work with your health care provider and dietitian to develop a meal plan that is right for you. Foods you can eat and foods you should limit or avoid will depend on the stage of your kidney disease and any other health conditions you have. The items listed below are not a complete list. Talk with your dietitian about what dietary choices are best for you. Potassium Potassium affects how steadily your heart beats. If too much potassium builds up in your blood, the potassium can cause an irregular heartbeat or  even a heart attack. You may need to limit or avoid foods that are high in potassium, such as:  Milk and soy milk.  Fruits, such as bananas, apricots, nectarines, melon, prunes, raisins, kiwi, and oranges.  Vegetables, such as potatoes, sweet potatoes, yams, tomatoes, leafy greens, beets, avocado, pumpkin, and winter squash.  White and lima beans.  Whole-wheat breads and pastas.  Beans and nuts. Phosphorus Phosphorus is a mineral found in your bones. A balance between calcium and phosphorus is needed to build and maintain healthy bones. Too much phosphorus pulls calcium from your bones. This can make your bones weak and more likely to break. Too much phosphorus can also make your skin itch. You may need to limit or avoid foods that are high in phosphorus, such as:  Milk and dairy products.  Dried beans and peas.  Tofu, soy milk, and other soy-based meat replacements.  Dark-colored sodas.  Nuts and peanut butter.  Meat, poultry, and fish.  Bran cereals and oatmeal. Protein Protein helps you make and keep muscle. It also helps to repair your body's cells and tissues. One of the natural breakdown products of protein is a waste product called urea. When your kidneys are not working properly, they cannot remove wastes, such as urea. Reducing how much protein you eat can help prevent a buildup of urea in your blood. Depending on your stage of kidney disease, you may need to limit foods that are high in protein. Sources of animal protein include:  Meat (all types).  Fish and seafood.  Poultry.  Eggs.  Dairy. Other protein foods include:  Beans and legumes.  Nuts and nut butter.  Soy and tofu.   Sodium Sodium helps to maintain a healthy balance of fluids in your body. Too much sodium can increase your blood pressure and have a negative effect on your heart and lungs. Too much sodium can also cause your body to retain too much fluid, making your kidneys work harder. Most  people should have less than 2,300 mg of sodium each day. If you have hypertension, you may need to limit your sodium to 1,500 mg each day. You may need to limit or avoid foods that are high in sodium, such as:  Salt seasonings.  Soy sauce.  Cured and processed meats.  Salted crackers and snack foods.  Fast food.  Canned soups and most canned foods.  Pickled foods.  Vegetable  juice.  Boxed mixes or ready-to-eat boxed meals and side dishes.  Bottled dressings, sauces, and marinades. Talk with your dietitian about how much potassium, phosphorus, protein, and sodium you may have each day. Summary  Chronic kidney disease (CKD) can lead to a buildup of waste and extra substances in the body. Certain foods lead to a buildup of these substances. By changing your diet as told, you can help prevent more kidney damage and delay or prevent the need for dialysis.  Food intake changes are different for each person with CKD. Work with a dietitian to set up nutrient goals and a meal plan that is right for you.  If you have diabetes and CKD, it is important to keep your blood sugar in the target range recommended by your health care provider. This information is not intended to replace advice given to you by your health care provider. Make sure you discuss any questions you have with your health care provider. Document Revised: 03/04/2020 Document Reviewed: 03/04/2020 Elsevier Patient Education  2021 [place].  Diabetes Mellitus and Nutrition, Adult When you have diabetes, or diabetes mellitus, it is very important to have healthy eating habits because your blood sugar (glucose) levels are greatly affected by what you eat and drink. Eating healthy foods in the right amounts, at about the same times every day, can help you:  Control your blood glucose.  Lower your risk of heart disease.  Improve your blood pressure.  Reach or maintain a healthy weight. What can affect my meal  plan? Every person with diabetes is different, and each person has different needs for a meal plan. Your health care provider may recommend that you work with a dietitian to make a meal plan that is best for you. Your meal plan may vary depending on factors such as:  The calories you need.  The medicines you take.  Your weight.  Your blood glucose, blood pressure, and cholesterol levels.  Your activity level.  Other health conditions you have, such as heart or kidney disease. How do carbohydrates affect me? Carbohydrates, also called carbs, affect your blood glucose level more than any other type of food. Eating carbs naturally raises the amount of glucose in your blood. Carb counting is a method for keeping track of how many carbs you eat. Counting carbs is important to keep your blood glucose at a healthy level, especially if you use insulin or take certain oral diabetes medicines. It is important to know how many carbs you can safely have in each meal. This is different for every person. Your dietitian can help you calculate how many carbs you should have at each meal and for each snack. How does alcohol affect me? Alcohol can cause a sudden decrease in blood glucose (hypoglycemia), especially if you use insulin or take certain oral diabetes medicines. Hypoglycemia can be a life-threatening condition. Symptoms of hypoglycemia, such as sleepiness, dizziness, and confusion, are similar to symptoms of having too much alcohol.  Do not drink alcohol if: ? Your health care provider tells you not to drink. ? You are pregnant, may be pregnant, or are planning to become pregnant.  If you drink alcohol: ? Do not drink on an empty stomach. ? Limit how much you use to:  0-1 drink a day for women.  0-2 drinks a day for men. ? Be aware of how much alcohol is in your drink. In the [place] one drink equals one 12 oz bottle of beer (355 mL), one 5  oz glass of wine (148 mL), or one 1 oz glass of hard  liquor (44 mL). ? Keep yourself hydrated with water, diet soda, or unsweetened iced tea.  Keep in mind that regular soda, juice, and other mixers may contain a lot of sugar and must be counted as carbs. What are tips for following this plan? Reading food labels  Start by checking the serving size on the "Nutrition Facts" label of packaged foods and drinks. The amount of calories, carbs, fats, and other nutrients listed on the label is based on one serving of the item. Many items contain more than one serving per package.  Check the total grams (g) of carbs in one serving. You can calculate the number of servings of carbs in one serving by dividing the total carbs by 15. For example, if a food has 30 g of total carbs per serving, it would be equal to 2 servings of carbs.  Check the number of grams (g) of saturated fats and trans fats in one serving. Choose foods that have a low amount or none of these fats.  Check the number of milligrams (mg) of salt (sodium) in one serving. Most people should limit total sodium intake to less than 2,300 mg per day.  Always check the nutrition information of foods labeled as "low-fat" or "nonfat." These foods may be higher in added sugar or refined carbs and should be avoided.  Talk to your dietitian to identify your daily goals for nutrients listed on the label. Shopping  Avoid buying canned, pre-made, or processed foods. These foods tend to be high in fat, sodium, and added sugar.  Shop around the outside edge of the grocery store. This is where you will most often find fresh fruits and vegetables, bulk grains, fresh meats, and fresh dairy. Cooking  Use low-heat cooking methods, such as baking, instead of high-heat cooking methods like deep frying.  Cook using healthy oils, such as olive, canola, or sunflower oil.  Avoid cooking with butter, cream, or high-fat meats. Meal planning  Eat meals and snacks regularly, preferably at the same times every  day. Avoid going long periods of time without eating.  Eat foods that are high in fiber, such as fresh fruits, vegetables, beans, and whole grains. Talk with your dietitian about how many servings of carbs you can eat at each meal.  Eat 4-6 oz (112-168 g) of lean protein each day, such as lean meat, chicken, fish, eggs, or tofu. One ounce (oz) of lean protein is equal to: ? 1 oz (28 g) of meat, chicken, or fish. ? 1 egg. ?  cup (62 g) of tofu.  Eat some foods each day that contain healthy fats, such as avocado, nuts, seeds, and fish.   What foods should I eat? Fruits Berries. Apples. Oranges. Peaches. Apricots. Plums. Grapes. Mango. Papaya. Pomegranate. Kiwi. Cherries. Vegetables Lettuce. Spinach. Leafy greens, including kale, chard, collard greens, and mustard greens. Beets. Cauliflower. Cabbage. Broccoli. Carrots. Green beans. Tomatoes. Peppers. Onions. Cucumbers. Brussels sprouts. Grains Whole grains, such as whole-wheat or whole-grain bread, crackers, tortillas, cereal, and pasta. Unsweetened oatmeal. Quinoa. Brown or wild rice. Meats and other proteins Seafood. Poultry without skin. Lean cuts of poultry and beef. Tofu. Nuts. Seeds. Dairy Low-fat or fat-free dairy products such as milk, yogurt, and cheese. The items listed above may not be a complete list of foods and beverages you can eat. Contact a dietitian for more information. What foods should I avoid? Fruits Fruits canned with syrup.  Vegetables Canned vegetables. Frozen vegetables with butter or cream sauce. Grains Refined white flour and flour products such as bread, pasta, snack foods, and cereals. Avoid all processed foods. Meats and other proteins Fatty cuts of meat. Poultry with skin. Breaded or fried meats. Processed meat. Avoid saturated fats. Dairy Full-fat yogurt, cheese, or milk. Beverages Sweetened drinks, such as soda or iced tea. The items listed above may not be a complete list of foods and beverages you  should avoid. Contact a dietitian for more information. Questions to ask a health care provider  Do I need to meet with a diabetes educator?  Do I need to meet with a dietitian?  What number can I call if I have questions?  When are the best times to check my blood glucose? Where to find more information:  American Diabetes Association: diabetes.org  Academy of Nutrition and Dietetics: www.eatright.[employer] of Diabetes and Digestive and Kidney Diseases: [employer]  Association of Diabetes Care and Education Specialists: www.diabeteseducator.org Summary  It is important to have healthy eating habits because your blood sugar (glucose) levels are greatly affected by what you eat and drink.  A healthy meal plan will help you control your blood glucose and maintain a healthy lifestyle.  Your health care provider may recommend that you work with a dietitian to make a meal plan that is best for you.  Keep in mind that carbohydrates (carbs) and alcohol have immediate effects on your blood glucose levels. It is important to count carbs and to use alcohol carefully. This information is not intended to replace advice given to you by your health care provider. Make sure you discuss any questions you have with your health care provider. Document Revised: 10/17/2019 Document Reviewed: 10/17/2019 Elsevier Patient Education  2021 [employer].

## 2021-04-10 LAB — COMPREHENSIVE METABOLIC PANEL
Albumin: 4.1 (ref 3.5–5.0)
Calcium: 10.2 (ref 8.7–10.7)

## 2021-04-10 LAB — BASIC METABOLIC PANEL
BUN: 36 — AB (ref 4–21)
CO2: 25 — AB (ref 13–22)
Chloride: 105 (ref 99–108)
Creatinine: 1.4 — AB (ref 0.5–1.1)
Glucose: 110
Potassium: 4.1 (ref 3.4–5.3)
Sodium: 140 (ref 137–147)

## 2021-04-10 LAB — CBC AND DIFFERENTIAL
HCT: 36 (ref 36–46)
Hemoglobin: 11.9 — AB (ref 12.0–16.0)
Neutrophils Absolute: 3.7

## 2021-04-10 LAB — IRON,TIBC AND FERRITIN PANEL: Iron: 60

## 2021-04-10 LAB — CBC: RBC: 3.73 — AB (ref 3.87–5.11)

## 2021-04-25 ENCOUNTER — Encounter: Payer: Self-pay | Admitting: Family Medicine

## 2021-05-27 ENCOUNTER — Other Ambulatory Visit: Payer: Self-pay | Admitting: Cardiovascular Disease

## 2021-05-30 IMAGING — DX DG HAND 2V*L*
2 series · 2 of 2 positions shown · non-contrast
Comparison: None.

CLINICAL DATA: Joint pain for years. Thumb deformity/contracture.
History of arthritis.

EXAM:
LEFT HAND - 2 VIEW

[hand pa]
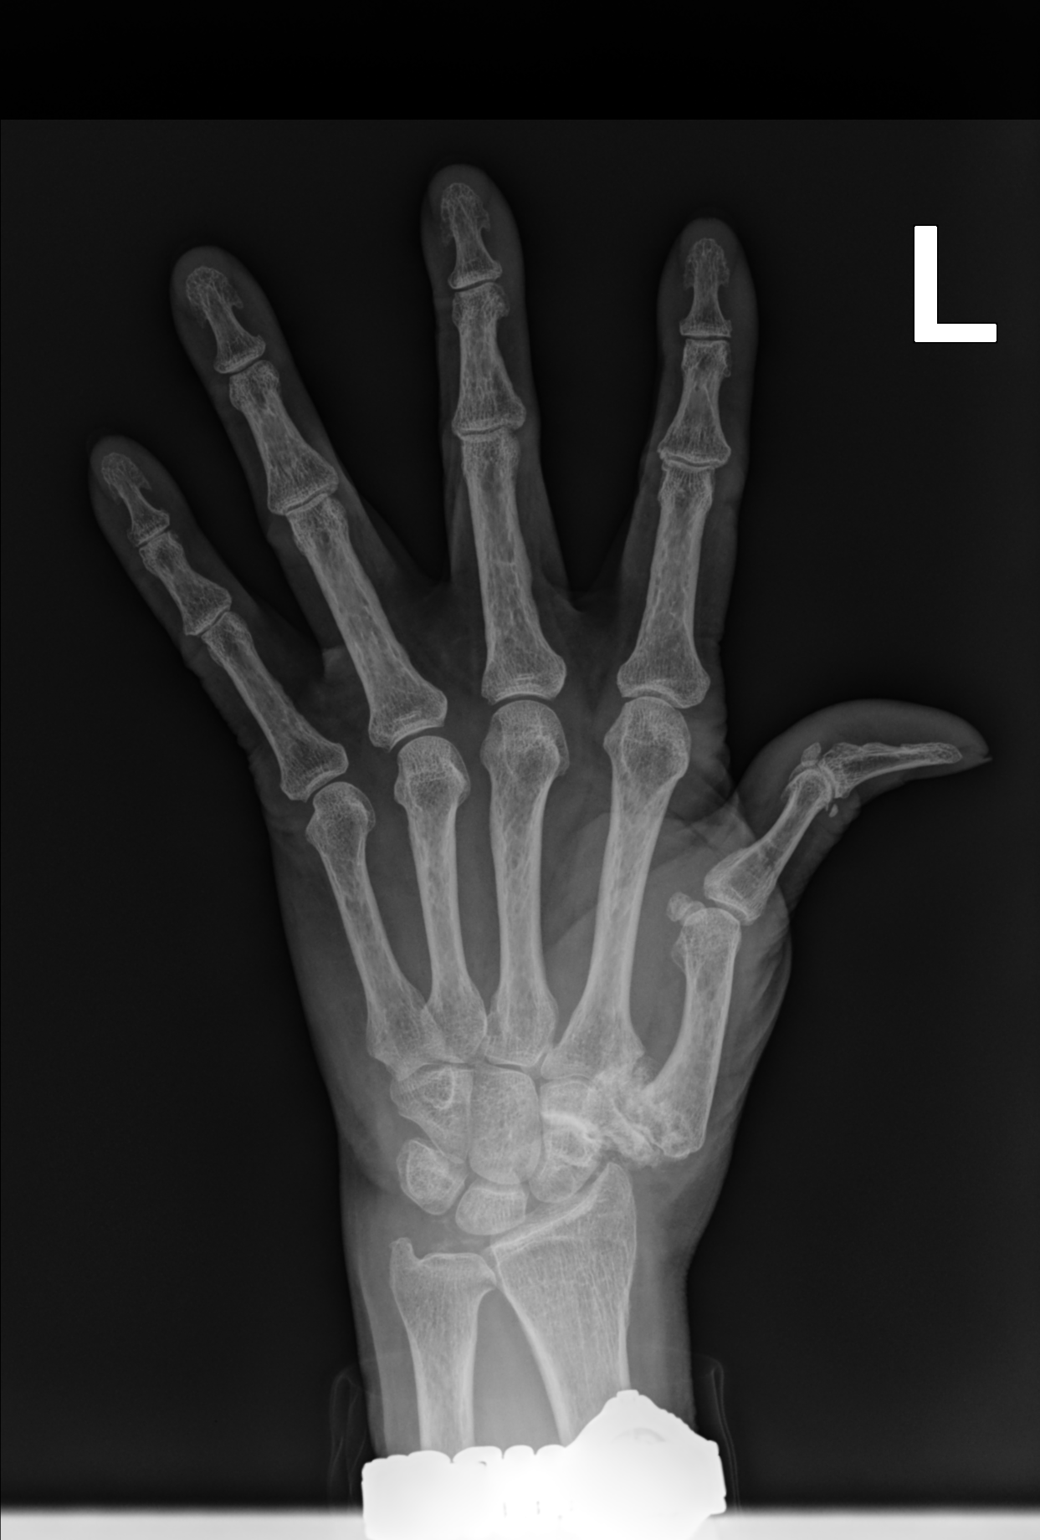

[hand lat]
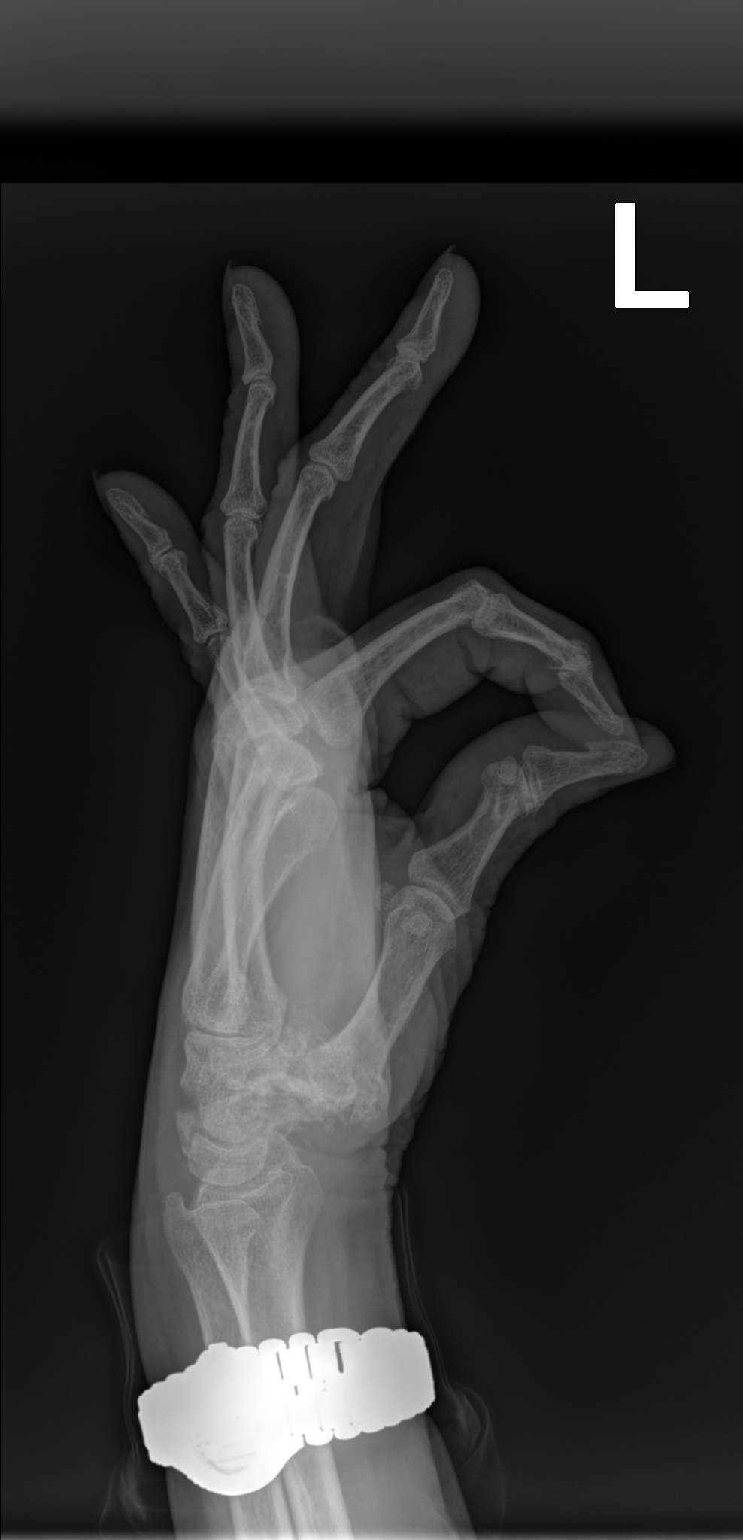

[2 of 2 positions shown; findings below may reference images not displayed]

FINDINGS: The bones appear mildly demineralized. There is no evidence of acute
fracture or dislocation. Advanced osteoarthritis is present at the
1st carpometacarpal and scaphotrapeziotrapezoidal joints. There are
minimal interphalangeal degenerative changes. No erosive changes or
focal soft tissue abnormalities. TFCC chondrocalcinosis noted.
IMPRESSION: Advanced osteoarthritis at the 1st carpometacarpal and
scaphotrapeziotrapezoidal joints.

## 2021-06-24 ENCOUNTER — Encounter: Payer: Self-pay | Admitting: Family Medicine

## 2021-07-01 LAB — COLOGUARD
COLOGUARD: NEGATIVE
Cologuard: NEGATIVE

## 2021-08-08 ENCOUNTER — Other Ambulatory Visit: Payer: Self-pay

## 2021-08-08 ENCOUNTER — Ambulatory Visit: Payer: Medicare Other | Admitting: Family Medicine

## 2021-08-08 ENCOUNTER — Encounter: Payer: Self-pay | Admitting: Family Medicine

## 2021-08-08 VITALS — BP 118/58 | HR 60 | Temp 98.1°F | Wt 186.6 lb

## 2021-08-08 DIAGNOSIS — E1122 Type 2 diabetes mellitus with diabetic chronic kidney disease: Secondary | ICD-10-CM

## 2021-08-08 DIAGNOSIS — I10 Essential (primary) hypertension: Secondary | ICD-10-CM | POA: Diagnosis not present

## 2021-08-08 DIAGNOSIS — N1832 Chronic kidney disease, stage 3b: Secondary | ICD-10-CM

## 2021-08-08 DIAGNOSIS — Z794 Long term (current) use of insulin: Secondary | ICD-10-CM

## 2021-08-08 DIAGNOSIS — I5032 Chronic diastolic (congestive) heart failure: Secondary | ICD-10-CM | POA: Diagnosis not present

## 2021-08-08 DIAGNOSIS — Z23 Encounter for immunization: Secondary | ICD-10-CM

## 2021-08-08 NOTE — Progress Notes (Signed)
Subjective:    Patient ID: Bonnie Chavez, female    DOB: 07/22/1934, 87 y.o.   MRN: [id number]  Chief Complaint  Patient presents with   Follow-up    BP and DM    HPI Patient was seen today for f/u on chronic conditions.  Patient states she has been doing well.  Was taken off vitamins by Dr. Archie Balboa.  Has upcoming nephrology appointment with Dr. Lawson Radar.  Bone density noted osteopenia in spine on 07/03/2021.  Patient trying to drink oat milk daily.  States blood sugar has been controlled.  Was 119 this AM.  Blood pressure has been controlled, typically in the 100s-1teens/upper 50s-70s.  Patient denies headaches, dizziness, recent falls, SOB, increased LE edema.  Having occasional joint pain.  Stop taking B6 for hand pain per nephrology.  Occasionally using cane for stability, has 2 in the car.  Pt had cologaurd, bone density, and mammogram.    Patient's twin grandchildren are doing well, one started playing soccer. Past Medical History:  Diagnosis Date   Anemia    History of , resolved   Aortic valve sclerosis    Carpal tunnel syndrome    Chicken pox    Chronic low back pain 02/27/2016   -DDD -seeing [facility]    Diabetes mellitus    type 2, sees Dr. Chalmers Cater   Goiter    Dr. Chalmers Cater   Heart murmur    History of nuclear stress test 06/2010   dipyridamole; normal pattern of perfusion; ekg negative for ischemia; low risk scan    Hot flashes    Hyperlipidemia    Hyperparathyroidism ([place]) 02/27/2016   -sees endocrinologist, Dr. Chalmers Cater, has seen surgeon as well    Hypertension    Migraine    no longer has migraines (80's)   Mitral valve prolapse    Neuropathy    Obesity 02/27/2016   Osteoarthritis    DDD, sees [facility]   PMB (postmenopausal bleeding) 02/28/2009   Resolved   Pneumonia    history of    Renal insufficiency    [name]    Vaginal atrophy    Vitamin D deficiency     Allergies  Allergen Reactions   Contrast Media [Iodinated Diagnostic Agents] Other  (See Comments)    Reaction unknown   Nsaids Other (See Comments)    Abnormal renal function   Sensipar [Cinacalcet Hcl]     Pt stated, "Made me feel bloated, gassy and fluid buildup"   Penicillins Rash and Other (See Comments)    Pt has taken Keflex without difficulty Has patient had a PCN reaction causing immediate rash, facial/tongue/throat swelling, SOB or lightheadedness with hypotension: yes Has patient had a PCN reaction causing severe rash involving mucus membranes or skin necrosis: no Has patient had a PCN reaction that required hospitalization no Has patient had a PCN reaction occurring within the last 10 years: no If all of the above answers are "NO", then may proceed with Cephalosporin use.    ROS General: Denies fever, chills, night sweats, changes in weight, changes in appetite HEENT: Denies headaches, ear pain, changes in vision, rhinorrhea, sore throat CV: Denies CP, palpitations, SOB, orthopnea Pulm: Denies SOB, cough, wheezing GI: Denies abdominal pain, nausea, vomiting, diarrhea, constipation GU: Denies dysuria, hematuria, frequency, vaginal discharge Msk: Denies muscle cramps +joint pain Neuro: Denies weakness, numbness, tingling Skin: Denies rashes, bruising Psych: Denies depression, anxiety, hallucinations     Objective:    Blood pressure (!) 118/58, pulse 60, temperature 98.1  F (36.7 C), temperature source Oral, weight 186 lb 9.6 oz (84.6 kg), SpO2 98 %.  Gen. Pleasant, well-nourished, in no distress, normal affect   HEENT: [place]/AT, face symmetric, conjunctiva clear, no scleral icterus, PERRLA, EOMI, nares patent without drainage Lungs: no accessory muscle use, CTAB, no wheezes or rales Cardiovascular: RRR, 3/6 murmur in R upper chest, no peripheral edema Musculoskeletal: No deformities, no cyanosis or clubbing, normal tone Neuro:  A&Ox3, CN II-XII intact, normal gait.   Skin:  Warm, dry, intact  Diabetic Foot Exam - Simple   Simple Foot Form Diabetic  Foot exam was performed with the following findings: Yes 08/08/2021  9:26 AM  Visual Inspection No deformities, no ulcerations, no other skin breakdown bilaterally: Yes Sensation Testing Intact to touch and monofilament testing bilaterally: Yes Pulse Check Posterior Tibialis and Dorsalis pulse intact bilaterally: Yes Comments Bunions of b/l feet.  Onychomycosis of great toenails bilaterally.      Wt Readings from Last 3 Encounters:  04/07/21 194 lb 12.8 oz (88.4 kg)  03/25/21 190 lb 12.8 oz (86.5 kg)  12/06/20 192 lb 3.2 oz (87.2 kg)    Lab Results  Component Value Date   WBC 7.8 12/27/2019   HGB 11.9 (A) 04/10/2021   HCT 36 04/10/2021   PLT 200.0 12/27/2019   GLUCOSE 138 (H) 11/04/2017   CHOL 168 11/04/2017   TRIG 90.0 11/04/2017   HDL 55.50 11/04/2017   LDLCALC 95 11/04/2017   ALT 15 05/24/2014   AST 25 05/24/2014   NA 140 04/10/2021   K 4.1 04/10/2021   CL 105 04/10/2021   CREATININE 1.4 (A) 04/10/2021   BUN 36 (A) 04/10/2021   CO2 25 (A) 04/10/2021   TSH 2.00 01/30/2016   INR 1.62 (H) 10/30/2010   HGBA1C 7.8 (H) 12/27/2019   MICROALBUR 0.9 05/24/2014    Assessment/Plan:  Type 2 diabetes mellitus with stage 3b chronic kidney disease, with long-term current use of insulin (HCC) -controlled -Hemoglobin A1c 7.3% on -Hemoglobin A1c 7.3% on 03/21/2021 -continue novolin 70/30 10 units in evening -foot exam done this visit -on ACEI and statin -eye exam negative for retinopathy 01/29/21 -has DM shoes -f/u with Endo, Dr. Suzette Battiest and nephrology, [name]  Essential hypertension -Controlled -Continue current medications including lisinopril 10 mg daily, Bystolic 5 mg twice daily, lasix 40 mg daily, Norvasc 5 mg -Continue lifestyle modifications -f/u with Cards, and Nephro  Need for influenza vaccination - Plan: Flu Vaccine QUAD High Dose(Fluad)  Chronic diastolic congestive heart failure (HCC) -Euvolemic -Continue current medications including Lasix 40 mg  daily, Bystolic 5 mg twice daily, lisinopril 10 mg daily, Norvasc -Continue fluid restriction and daily weights -Follow-up with Cardiology  F/u in 4 months, sooner if needed.  Grier Mitts, MD

## 2021-09-02 ENCOUNTER — Other Ambulatory Visit: Payer: Self-pay | Admitting: Cardiovascular Disease

## 2021-10-06 LAB — BASIC METABOLIC PANEL
BUN: 47 — AB (ref 4–21)
CO2: 27 — AB (ref 13–22)
Chloride: 104 (ref 99–108)
Creatinine: 1.8 — AB (ref 0.5–1.1)
Glucose: 185
Potassium: 4.6 (ref 3.4–5.3)
Sodium: 139 (ref 137–147)

## 2021-10-06 LAB — IRON,TIBC AND FERRITIN PANEL
Ferritin: 273
Iron: 53
UIBC: 251

## 2021-10-06 LAB — COMPREHENSIVE METABOLIC PANEL
Albumin: 4.1 (ref 3.5–5.0)
Calcium: 10.4 (ref 8.7–10.7)
GFR calc Af Amer: 28

## 2021-10-06 LAB — CBC AND DIFFERENTIAL
HCT: 11 — AB (ref 36–46)
Neutrophils Absolute: 3.8
Platelets: 163 (ref 150–399)
WBC: 6.9

## 2021-10-06 LAB — CBC: RBC: 3.6 — AB (ref 3.87–5.11)

## 2021-10-15 ENCOUNTER — Encounter: Payer: Self-pay | Admitting: Family Medicine

## 2021-11-05 ENCOUNTER — Ambulatory Visit (INDEPENDENT_AMBULATORY_CARE_PROVIDER_SITE_OTHER): Payer: Medicare Other

## 2021-11-05 VITALS — BP 122/62 | HR 60 | Temp 98.2°F | Ht 64.0 in | Wt 189.9 lb

## 2021-11-05 DIAGNOSIS — Z0000 Encounter for general adult medical examination without abnormal findings: Secondary | ICD-10-CM

## 2021-11-05 NOTE — Patient Instructions (Addendum)
Bonnie Chavez , Thank you for taking time to come for your Medicare Wellness Visit. I appreciate your ongoing commitment to your health goals. Please review the following plan we discussed and let me know if I can assist you in the future.   These are the goals we discussed:  Goals      Exercise 150 min/wk Moderate Activity     Continues exercise at the y x 3 per week      patient      Eat right; stay healthy Fat free or low fat dairy products Fish high in omega-3 acids ( salmon, tuna, trout) Fruits, such as apples, bananas, oranges, pears, prunes Legumes, such as kidney beans, lentils, checkpeas, black-eyed peas and lima beans Vegetables; broccoli, cabbage, carrots Whole grains;   Plant fats are better; decrease "white" foods as pasta, rice, bread and desserts, sugar; Avoid red meat (limiting) palm and coconut oils; sugary foods and beverages  Two nutrients that raise blood chol levels are saturated fats and trans fat; in hydrogenated oils and fats, as stick margarine, baked goods (cookes, cakes, pies, crackers; frosting; and coffee creamers;   Some Fats lower cholesterol: Monounsaturated and polyunsaturated  Avocados Corn, sunflower, and soybean oils Nuts and seeds, such as walnuts Olive, canola, peanut, safflower, and sesame oils Peanut butter Salmon and trout Tofu        Patient Stated     Lose 5 pounds by next year!  Get A1c down by monitoring portions and carbohydrate/simple sugar intake.        This is a list of the screening recommended for you and due dates:  Health Maintenance  Topic Date Due   Hemoglobin A1C  12/25/2020   COVID-19 Vaccine (4 - Booster for Pfizer series) 11/21/2021*   Tetanus Vaccine  02/27/2025*   Eye exam for diabetics  01/29/2022   Complete foot exam   08/08/2022   Pneumonia Vaccine  Completed   Flu Shot  Completed   DEXA scan (bone density measurement)  Completed   Zoster (Shingles) Vaccine  Completed   HPV Vaccine  Aged Out  *Topic was  postponed. The date shown is not the original due date.     Advanced directives: Yes  Conditions/risks identified: None  Next appointment: Follow up in one year for your annual wellness visit.   Preventive Care 72 Years and Older, Female Preventive care refers to lifestyle choices and visits with your health care provider that can promote health and wellness. What does preventive care include? A yearly physical exam. This is also called an annual well check. Dental exams once or twice a year. Routine eye exams. Ask your health care provider how often you should have your eyes checked. Personal lifestyle choices, including: Daily care of your teeth and gums. Regular physical activity. Eating a healthy diet. Avoiding tobacco and drug use. Limiting alcohol use. Practicing safe sex. Taking low doses of aspirin every day. Taking vitamin and mineral supplements as recommended by your health care provider. What happens during an annual well check? The services and screenings done by your health care provider during your annual well check will depend on your age, overall health, lifestyle risk factors, and family history of disease. Counseling  Your health care provider may ask you questions about your: Alcohol use. Tobacco use. Drug use. Emotional well-being. Home and relationship well-being. Sexual activity. Eating habits. History of falls. Memory and ability to understand (cognition). Work and work Statistician. Screening  You may have the following tests or  measurements: Height, weight, and BMI. Blood pressure. Lipid and cholesterol levels. These may be checked every 5 years, or more frequently if you are over 91 years old. Skin check. Lung cancer screening. You may have this screening every year starting at age 63 if you have a 30-pack-year history of smoking and currently smoke or have quit within the past 15 years. Fecal occult blood test (FOBT) of the stool. You may have  this test every year starting at age 61. Flexible sigmoidoscopy or colonoscopy. You may have a sigmoidoscopy every 5 years or a colonoscopy every 10 years starting at age 66. Prostate cancer screening. Recommendations will vary depending on your family history and other risks. Hepatitis C blood test. Hepatitis B blood test. Sexually transmitted disease (STD) testing. Diabetes screening. This is done by checking your blood sugar (glucose) after you have not eaten for a while (fasting). You may have this done every 1-3 years. Abdominal aortic aneurysm (AAA) screening. You may need this if you are a current or former smoker. Osteoporosis. You may be screened starting at age 2 if you are at high risk. Talk with your health care provider about your test results, treatment options, and if necessary, the need for more tests. Vaccines  Your health care provider may recommend certain vaccines, such as: Influenza vaccine. This is recommended every year. Tetanus, diphtheria, and acellular pertussis (Tdap, Td) vaccine. You may need a Td booster every 10 years. Zoster vaccine. You may need this after age 43. Pneumococcal 13-valent conjugate (PCV13) vaccine. One dose is recommended after age 16. Pneumococcal polysaccharide (PPSV23) vaccine. One dose is recommended after age 69. Talk to your health care provider about which screenings and vaccines you need and how often you need them. This information is not intended to replace advice given to you by your health care provider. Make sure you discuss any questions you have with your health care provider. Document Released: 12/06/2015 Document Revised: 07/29/2016 Document Reviewed: 09/10/2015 Elsevier Interactive Patient Education  2017 [place] Prevention in the Home Falls can cause injuries. They can happen to people of all ages. There are many things you can do to make your home safe and to help prevent falls. What can I do on the outside of my  home? Regularly fix the edges of walkways and driveways and fix any cracks. Remove anything that might make you trip as you walk through a door, such as a raised step or threshold. Trim any bushes or trees on the path to your home. Use bright outdoor lighting. Clear any walking paths of anything that might make someone trip, such as rocks or tools. Regularly check to see if handrails are loose or broken. Make sure that both sides of any steps have handrails. Any raised decks and porches should have guardrails on the edges. Have any leaves, snow, or ice cleared regularly. Use sand or salt on walking paths during winter. Clean up any spills in your garage right away. This includes oil or grease spills. What can I do in the bathroom? Use night lights. Install grab bars by the toilet and in the tub and shower. Do not use towel bars as grab bars. Use non-skid mats or decals in the tub or shower. If you need to sit down in the shower, use a plastic, non-slip stool. Keep the floor dry. Clean up any water that spills on the floor as soon as it happens. Remove soap buildup in the tub or shower regularly. Attach bath  mats securely with double-sided non-slip rug tape. Do not have throw rugs and other things on the floor that can make you trip. What can I do in the bedroom? Use night lights. Make sure that you have a light by your bed that is easy to reach. Do not use any sheets or blankets that are too big for your bed. They should not hang down onto the floor. Have a firm chair that has side arms. You can use this for support while you get dressed. Do not have throw rugs and other things on the floor that can make you trip. What can I do in the kitchen? Clean up any spills right away. Avoid walking on wet floors. Keep items that you use a lot in easy-to-reach places. If you need to reach something above you, use a strong step stool that has a grab bar. Keep electrical cords out of the way. Do  not use floor polish or wax that makes floors slippery. If you must use wax, use non-skid floor wax. Do not have throw rugs and other things on the floor that can make you trip. What can I do with my stairs? Do not leave any items on the stairs. Make sure that there are handrails on both sides of the stairs and use them. Fix handrails that are broken or loose. Make sure that handrails are as long as the stairways. Check any carpeting to make sure that it is firmly attached to the stairs. Fix any carpet that is loose or worn. Avoid having throw rugs at the top or bottom of the stairs. If you do have throw rugs, attach them to the floor with carpet tape. Make sure that you have a light switch at the top of the stairs and the bottom of the stairs. If you do not have them, ask someone to add them for you. What else can I do to help prevent falls? Wear shoes that: Do not have high heels. Have rubber bottoms. Are comfortable and fit you well. Are closed at the toe. Do not wear sandals. If you use a stepladder: Make sure that it is fully opened. Do not climb a closed stepladder. Make sure that both sides of the stepladder are locked into place. Ask someone to hold it for you, if possible. Clearly mark and make sure that you can see: Any grab bars or handrails. First and last steps. Where the edge of each step is. Use tools that help you move around (mobility aids) if they are needed. These include: Canes. Walkers. Scooters. Crutches. Turn on the lights when you go into a dark area. Replace any light bulbs as soon as they burn out. Set up your furniture so you have a clear path. Avoid moving your furniture around. If any of your floors are uneven, fix them. If there are any pets around you, be aware of where they are. Review your medicines with your doctor. Some medicines can make you feel dizzy. This can increase your chance of falling. Ask your doctor what other things that you can do to  help prevent falls. This information is not intended to replace advice given to you by your health care provider. Make sure you discuss any questions you have with your health care provider. Document Released: 09/05/2009 Document Revised: 04/16/2016 Document Reviewed: 12/14/2014 Elsevier Interactive Patient Education  2017 [employer].

## 2021-11-05 NOTE — Progress Notes (Signed)
Subjective:   Isma Tietje is a 87 y.o. female who presents for Medicare Annual (Subsequent) preventive examination.  Review of Systems    No ROS Cardiac Risk Factors include: advanced age (>13men, >18 women);hypertension;diabetes mellitus    Objective:    Today's Vitals   11/05/21 0806  BP: 122/62  Pulse: 60  Temp: 98.2 F (36.8 C)  Weight: 189 lb 14.4 oz (86.1 kg)  Height: 5\' 4"  (1.626 m)   Body mass index is 32.6 kg/m.  Advanced Directives 11/05/2021 11/04/2020 01/05/2019 11/05/2017 05/25/2017 10/29/2016 08/14/2016  Does Patient Have a Medical Advance Directive? Yes Yes Yes Yes Yes Yes No  Type of Paramedic of [place];Living will [place];Living will [place];Living will - [place];Living will - -  Does patient want to make changes to medical advance directive? No - Patient declined No - Patient declined - - No - Patient declined - -  Copy of [place] in Chart? No - copy requested Yes - validated most recent copy scanned in chart (See row information) Yes - validated most recent copy scanned in chart (See row information) - No - copy requested - -  Would patient like information on creating a medical advance directive? - - - - - - No - patient declined information    Current Medications (verified) Outpatient Encounter Medications as of 11/05/2021  Medication Sig   acetaminophen (TYLENOL) 500 MG tablet Take 1 tablet (500 mg total) by mouth every 4 (four) hours as needed for moderate pain.   amLODipine (NORVASC) 5 MG tablet Take 5 mg by mouth daily.   aspirin 81 MG EC tablet Take 81 mg by mouth at bedtime. Swallow whole.   BD PEN NEEDLE NANO 2ND GEN 32G X 4 MM MISC USE AS DIRECTED 90   cephALEXin (KEFLEX) 500 MG capsule Take 500-1,000 mg by mouth See admin instructions. TAKE ONLY FOR INVASIVE DENTAL PROCEDURES   cinacalcet (SENSIPAR) 30 MG tablet 1 TABLET MON, WED, FRI, TAKE  WITH LARGEST MEAL OF THE DAY   furosemide (LASIX) 20 MG tablet Take 40 mg by mouth daily. Can take additional tablet if needed   gabapentin (NEURONTIN) 100 MG capsule Take 100 mg by mouth at bedtime.   HUMULIN 70/30 KWIKPEN (70-30) 100 UNIT/ML KwikPen Inject 10 Units into the skin every evening.   levothyroxine (SYNTHROID) 50 MCG tablet Take 50 mcg by mouth daily before breakfast.   lisinopril (ZESTRIL) 10 MG tablet TAKE 1 TABLET BY MOUTH EVERY DAY   nebivolol (BYSTOLIC) 10 MG tablet TAKE 1/2 TABLET BY MOUTH TWICE A DAY   OneTouch Delica Lancets 18H MISC Apply topically.   ONETOUCH VERIO test strip 1 each 2 (two) times daily.   rosuvastatin (CRESTOR) 40 MG tablet TAKE 1 TABLET BY MOUTH EVERYDAY AT BEDTIME   No facility-administered encounter medications on file as of 11/05/2021.    Allergies (verified) Contrast media [iodinated diagnostic agents], Nsaids, Sensipar [cinacalcet hcl], and Penicillins   History: Past Medical History:  Diagnosis Date   Anemia    History of , resolved   Aortic valve sclerosis    Carpal tunnel syndrome    Chicken pox    Chronic low back pain 02/27/2016   -DDD -seeing [place] [facility]    Diabetes mellitus    type 2, sees Dr. Chalmers Cater   Goiter    Dr. Chalmers Cater   Heart murmur    History of nuclear stress test 06/2010   dipyridamole;  normal pattern of perfusion; ekg negative for ischemia; low risk scan    Hot flashes    Hyperlipidemia    Hyperparathyroidism ([place]) 02/27/2016   -sees endocrinologist, Dr. Chalmers Cater, has seen surgeon as well    Hypertension    Migraine    no longer has migraines (80's)   Mitral valve prolapse    Neuropathy    Obesity 02/27/2016   Osteoarthritis    DDD, sees [place] [facility]   PMB (postmenopausal bleeding) 02/28/2009   Resolved   Pneumonia    history of    Renal insufficiency    [name]    Vaginal atrophy    Vitamin D deficiency    Past Surgical History:  Procedure Laterality Date   CESAREAN SECTION  1972 &1976    [place] N/A 05/15/2013   Procedure: [place];  Surgeon: Eldred Manges, MD;  Location: [facility];  Service: Gynecology;  Laterality: N/A;   DILATATION & CURRETTAGE/HYSTEROSCOPY WITH RESECTOCOPE N/A 06/03/2017   Procedure: DILATATION & CURETTAGE/HYSTEROSCOPY;  Surgeon: Eldred Manges, MD;  Location: [facility];  Service: Gynecology;  Laterality: N/A;   [place]   KNEE SURGERY  2000 2011   right knee replacement    REPLACEMENT TOTAL KNEE  2000   left   SHOULDER SURGERY  2004 2010    TRANSTHORACIC ECHOCARDIOGRAM  07/2010   EF=>55%; LA mildly dilated; trace MR/TR;   TUBAL LIGATION  1976   Family History  Problem Relation Age of Onset   Hypertension Mother    Diabetes Mother    Hyperlipidemia Mother    Heart attack Mother    Heart disease Father    Heart attack Father    Stroke Brother    Heart disease Brother    Diabetes Brother    Uterine cancer Maternal Grandmother    Social History   Socioeconomic History   Marital status: Divorced    Spouse name: Not on file   Number of children: 2   Years of education: Not on file   Highest education level: Not on file  Occupational History   Not on file  Tobacco Use   Smoking status: Never   Smokeless tobacco: Never  Substance and Sexual Activity   Alcohol use: No    Alcohol/week: 0.0 standard drinks   Drug use: No   Sexual activity: Not on file  Other Topics Concern   Not on file  Social History Narrative   Work or School: none      Home Situation: lives alone, feels safe, son lives nearby      Spiritual Beliefs: Christian      Lifestyle: Y 3x per week, healthy diet       01/05/2019: Lives alone in one level house.   Goes to [employer] to do water aerobics 3X/week   Has son and daughter, son local      Social Determinants of Health    Financial Resource Strain: Low Risk    Difficulty of Paying Living Expenses: Not hard at all  Food Insecurity: No Food Insecurity   Worried About Charity fundraiser in the Last Year: Never true   Arboriculturist in the Last Year: Never true  Transportation Needs: No Transportation Needs   Lack of Transportation (Medical): No   Lack of Transportation (Non-Medical): No  Physical Activity:  Insufficiently Active   Days of Exercise per Week: 2 days   Minutes of Exercise per Session: 60 min  Stress: No Stress Concern Present   Feeling of Stress : Not at all  Social Connections: Moderately Integrated   Frequency of Communication with Friends and Family: More than three times a week   Frequency of Social Gatherings with Friends and Family: More than three times a week   Attends Religious Services: 1 to 4 times per year   Active Member of [employer] or Organizations: Yes   Attends Archivist Meetings: 1 to 4 times per year   Marital Status: Divorced    Clinical Intake: Nutrition Risk Assessment:  Has the patient had any N/V/D within the last 2 months?  No  Does the patient have any non-healing wounds?  No  Has the patient had any unintentional weight loss or weight gain?  No   Diabetes:  Is the patient diabetic?  Yes  If diabetic, was a CBG obtained today?  Yes  Did the patient bring in their glucometer from home?  No  How often do you monitor your CBG's? 2 X Daily.   Financial Strains and Diabetes Management:  Are you having any financial strains with the device, your supplies or your medication? No .  Does the patient want to be seen by Chronic Care Management for management of their diabetes?  No  Would the patient like to be referred to a Nutritionist or for Diabetic Management?  No   Diabetic Exams:  Diabetic Eye Exam: Completed Yes.   Diabetic Foot Exam: Completed Yes. Pre-visit preparation completed: Yes How often do you need to have someone help you when you  read instructions, pamphlets, or other written materials from your doctor or pharmacy?: 1 - Never  Diabetic? Yes  Interpreter Needed?: No Activities of Daily Living In your present state of health, do you have any difficulty performing the following activities: 11/05/2021  Hearing? (No Data)  Comment Wears hearing aids  Vision? N  Comment Wears glasses.  Difficulty concentrating or making decisions? N  Walking or climbing stairs? N  Dressing or bathing? N  Doing errands, shopping? N  Preparing Food and eating ? N  Using the Toilet? N  In the past six months, have you accidently leaked urine? N  Managing your Medications? N  Managing your Finances? N  Housekeeping or managing your Housekeeping? N  Some recent data might be hidden    Patient Care Team: Billie Ruddy, MD as PCP - General (Family Medicine) Jacelyn Pi, MD as Consulting Physician (Endocrinology) Deterding, Jeneen Rinks, MD as Consulting Physician (Nephrology) Leo Grosser, Seymour Bars, MD (Inactive) as Consulting Physician (Obstetrics and Gynecology) Sharyne Peach, MD as Consulting Physician (Ophthalmology) Gaynelle Arabian, MD as Consulting Physician (Orthopedic Surgery) Rosita Fire, MD as Consulting Physician (Nephrology)  Indicate any recent Medical Services you may have received from other than [facility] providers in the past year (date may be approximate).     Assessment:   This is a routine wellness examination for [place].  Hearing/Vision screen Hearing Screening - Comments:: Wears hearing aids Vision Screening - Comments:: Wears glasses. Followed by Dr Delman Cheadle  Dietary issues and exercise activities discussed: Current Exercise Habits: Home exercise routine, Type of exercise: walking, Time (Minutes): 60, Frequency (Times/Week): 3, Weekly Exercise (Minutes/Week): 180, Intensity: Moderate   Goals Addressed   None    Depression Screen [facility] 2/9 Scores 11/05/2021 11/04/2020 05/01/2020 01/05/2019 11/05/2017  11/04/2017 10/29/2016  PHQ - 2 Score  0 0 0 0 0 0 0  PHQ- 9 Score - 0 - 0 - - -    Fall Risk Fall Risk  11/05/2021 11/04/2020 05/01/2020 01/05/2019 11/05/2017  Falls in the past year? 0 0 0 0 No  Comment - - - - -  Number falls in past yr: 0 0 0 - -  Injury with Fall? 0 0 0 - -  Risk for fall due to : - No Fall Risks No Fall Risks - -  Follow up - Falls prevention discussed;Falls evaluation completed Falls evaluation completed Falls prevention discussed -    FALL RISK PREVENTION PERTAINING TO THE HOME:  Any stairs in or around the home? No  If so, are there any without handrails? No  Home free of loose throw rugs in walkways, pet beds, electrical cords, etc? Yes  Adequate lighting in your home to reduce risk of falls? Yes   ASSISTIVE DEVICES UTILIZED TO PREVENT FALLS:  Life alert? No  Use of a cane, walker or w/c? Yes  Grab bars in the bathroom? Yes  Shower chair or bench in shower? Yes  Elevated toilet seat or a handicapped toilet? Yes   TIMED UP AND GO:  Was the test performed? Yes .  Length of time to ambulate 10 feet: 5 sec.   Gait steady and fast without use of assistive device  Cognitive Function: MMSE - Mini Mental State Exam 11/05/2017 10/29/2016  Not completed: (No Data) (No Data)     6CIT Screen 11/05/2021 11/04/2020  What Year? 0 points 0 points  What month? 0 points 0 points  What time? 0 points 0 points  Count back from 20 0 points 0 points  Months in reverse 0 points 0 points  Repeat phrase 0 points 2 points  Total Score 0 2    Immunizations Immunization History  Administered Date(s) Administered   Fluad Quad(high Dose 65+) 08/08/2019, 07/23/2020, 08/08/2021   Influenza Whole 08/23/2012   Influenza, High Dose Seasonal PF 08/25/2016, 08/07/2018   Influenza,inj,quad, With Preservative 08/24/2015, 08/19/2017   Influenza-Unspecified 08/26/2015, 08/25/2016, 08/19/2017   PFIZER(Purple Top)SARS-COV-2 Vaccination 02/05/2020, 02/26/2020, 11/05/2020    Pneumococcal Conjugate-13 02/28/2015   Pneumococcal Polysaccharide-23 07/25/2011   Unspecified SARS-COV-2 Vaccination 02/05/2020, 02/26/2020   Zoster Recombinat (Shingrix) 08/07/2018, 01/11/2019    Covid-19 vaccine status: Information provided on how to obtain vaccines.   Screening Tests Health Maintenance  Topic Date Due   HEMOGLOBIN A1C  12/25/2020   COVID-19 Vaccine (4 - Booster for Pfizer series) 11/21/2021 (Originally 12/31/2020)   TETANUS/TDAP  02/27/2025 (Originally 03/25/1953)   OPHTHALMOLOGY EXAM  01/29/2022   FOOT EXAM  08/08/2022   Pneumonia Vaccine 47+ Years old  Completed   INFLUENZA VACCINE  Completed   DEXA SCAN  Completed   Zoster Vaccines- Shingrix  Completed   HPV VACCINES  Aged Out    Health Maintenance  Health Maintenance Due  Topic Date Due   HEMOGLOBIN A1C  12/25/2020     Additional Screening:  Vision Screening: Recommended annual ophthalmology exams for early detection of glaucoma and other disorders of the eye. Is the patient up to date with their annual eye exam?  Yes  Who is the provider or what is the name of the office in which the patient attends annual eye exams? Followed by Dr Delman Cheadle.   Dental Screening: Recommended annual dental exams for proper oral hygiene  Community Resource Referral / Chronic Care Management:   CRR required this visit?  No   CCM required this  visit?  No      Plan:     I have personally reviewed and noted the following in the patients chart:   Medical and social history Use of alcohol, tobacco or illicit drugs  Current medications and supplements including opioid prescriptions. Patient currently not taking opioids. Functional ability and status Nutritional status Physical activity Advanced directives List of other physicians Hospitalizations, surgeries, and ER visits in previous 12 months Vitals Screenings to include cognitive, depression, and falls Referrals and appointments  In addition, I have  reviewed and discussed with patient certain preventive protocols, quality metrics, and best practice recommendations. A written personalized care plan for preventive services as well as general preventive health recommendations were provided to patient.     Criselda Peaches, LPN   13/06/6577

## 2021-11-25 ENCOUNTER — Other Ambulatory Visit: Payer: Self-pay

## 2021-11-25 ENCOUNTER — Ambulatory Visit (HOSPITAL_COMMUNITY): Payer: Medicare HMO | Attending: Cardiovascular Disease

## 2021-11-25 DIAGNOSIS — I5032 Chronic diastolic (congestive) heart failure: Secondary | ICD-10-CM | POA: Insufficient documentation

## 2021-11-25 DIAGNOSIS — E785 Hyperlipidemia, unspecified: Secondary | ICD-10-CM | POA: Insufficient documentation

## 2021-11-25 DIAGNOSIS — I10 Essential (primary) hypertension: Secondary | ICD-10-CM | POA: Diagnosis present

## 2021-11-25 LAB — ECHOCARDIOGRAM COMPLETE
AR max vel: 1.66 cm2
AV Area VTI: 1.61 cm2
AV Area mean vel: 1.69 cm2
AV Mean grad: 20 mmHg
AV Peak grad: 35.6 mmHg
Ao pk vel: 2.99 m/s
Area-P 1/2: 2.95 cm2
S' Lateral: 2.3 cm

## 2021-12-04 ENCOUNTER — Encounter: Payer: Self-pay | Admitting: Cardiovascular Disease

## 2021-12-04 ENCOUNTER — Other Ambulatory Visit: Payer: Self-pay

## 2021-12-04 ENCOUNTER — Ambulatory Visit: Payer: Medicare HMO | Admitting: Cardiovascular Disease

## 2021-12-04 VITALS — BP 108/68 | HR 65 | Ht 64.0 in | Wt 187.2 lb

## 2021-12-04 DIAGNOSIS — N1832 Chronic kidney disease, stage 3b: Secondary | ICD-10-CM | POA: Diagnosis not present

## 2021-12-04 DIAGNOSIS — R809 Proteinuria, unspecified: Secondary | ICD-10-CM

## 2021-12-04 DIAGNOSIS — I10 Essential (primary) hypertension: Secondary | ICD-10-CM

## 2021-12-04 DIAGNOSIS — E785 Hyperlipidemia, unspecified: Secondary | ICD-10-CM

## 2021-12-04 DIAGNOSIS — I440 Atrioventricular block, first degree: Secondary | ICD-10-CM

## 2021-12-04 DIAGNOSIS — I350 Nonrheumatic aortic (valve) stenosis: Secondary | ICD-10-CM | POA: Diagnosis not present

## 2021-12-04 DIAGNOSIS — Z794 Long term (current) use of insulin: Secondary | ICD-10-CM

## 2021-12-04 DIAGNOSIS — E1129 Type 2 diabetes mellitus with other diabetic kidney complication: Secondary | ICD-10-CM

## 2021-12-04 DIAGNOSIS — I5032 Chronic diastolic (congestive) heart failure: Secondary | ICD-10-CM

## 2021-12-04 NOTE — Patient Instructions (Addendum)
Medication Instructions:  The current medical regimen is effective;  continue present plan and medications as directed. Please refer to the Current Medication list given to you today.  *If you need a refill on your cardiac medications before your next appointment, please call your pharmacy*  Lab Work: WITH PCP If you have labs (blood work) drawn today and your tests are completely normal, you will receive your results only by:  [place] (if you have MyChart) OR A paper copy in the mail.  If you have any lab test that is abnormal or we need to change your treatment, we will call you to review the results. You may go to any [facility] that is convenient for you however, we do have a lab in our office that is able to assist you. You DO NOT need an appointment for our lab. The lab is open 8:00am and closes at 4:00pm. Lunch 12:45 - 1:45pm.  Testing/Procedures: [place] physician has requested that you have an echocardiogram. Echocardiography is a painless test that uses sound waves to create images of your heart. It provides your doctor with information about the size and shape of your heart and how well your hearts chambers and valves are working. This procedure takes approximately one hour. There are no restrictions for this procedure. This will be performed at either our [facility] location - [address], [place] location [facility] 2nd floor.  Follow-Up: Your next appointment:  AFTER  ECHO  In Person with Shelva Majestic, MD   Please call our office 2 months in advance to schedule this appointment.1  At [facility], you and your health needs are our priority.  As part of our continuing mission to provide you with exceptional heart care, we have created designated Provider Care Teams.  These Care Teams include your primary Cardiologist (physician) and Advanced Practice Providers (APPs -  Physician Assistants and Nurse Practitioners) who  all work together to provide you with the care you need, when you need it.

## 2021-12-04 NOTE — Progress Notes (Signed)
Patient ID: Bonnie Chavez, female   DOB: [birth year], 88 y.o.   MRN: [id number]      HPI: Bonnie Chavez is a 88 y.o. female presents to the office today for an 8 month cardiology evaluation.  Bonnie Chavez has a history of hypertension with documented grade 1 diastolic dysfunction, hyperlipidemia, type 2 diabetes mellitus with renal insufficiency, as well as aortic valve sclerosis without stenosis. An echo Doppler study in 2011 showed an EF of 55%.  In 2011 a nuclear perfusion scan showed normal perfusion and function.  She has a history of a goiter for which she sees Bonnie Chavez, Bonnie Chavez for her renal insufficiency, Bonnie Chavez for uterine polyps and fibroids.  She has a history of hyperparathyroidism and saw Bonnie Chavez; she has not had surgery in his undergoing continued surveillance of her calcium levels.  She underwent a five-year follow-up echo Doppler study on 08/23/2015.  This continued to show normal systolic function with mild LVH and grade 1 diastolic dysfunction.  There was a 10 mm mean gradient and 22 mm peak gradient across her moderately calcified aortic valve with a valve area of 1.63 cm.    She has a long-standing history of diabetes mellitus  and sees Bonnie Chavez.  She sees Bonnie Chavez for renal insufficiency.  I reviewed her recent blood work from his office 03/09/2017 which showed her creatinine had improved to 1.5.  In March 2018 Cr was 1.63 and in December 2017 Cr was 1.83.    When I saw her in May 2018, she had had subsequent blood work which had revealed improvement in creatinine to 1.5.  I recommended she undergo a 2 year follow-up echo Doppler evaluation of her aortic stenosis.  This was done on 08/10/2007 and showed an EF of 65-70%.  Her mean aortic gradient has increased to 16 and her peak gradient 30.  There was grade 1 diastolic dysfunction, mild MR, mild LA dilation and mild TR.  Her estimated aortic valve area is 1.58 cm.  She denied any chest pain, presyncope or  syncope.    I saw her in October 2018.  She has continued to be followed by Bonnie Chavez of endocrinology and Bonnie Chavez of nephrology.  She has renal insufficiency and her creatinine in August 2018 was 1.7.  Her blood pressure was stable and I recommended reduction of her furosemide to 20 mg from her prior dose of 40 mg.  Any chest pain.  She denies presyncope or syncope.  She tells me her recent hemoglobin A1c was 7.6.  She is  followed by Bonnie Chavez of endocrinology and Bonnie Chavez of nephrology. A repeat creatinine in August 2018 was 1.7.    I saw her in the office for evaluation in November 2019.  At that time she denied any chest pain, shortness of breath, presyncope or syncope.  She underwent a repeat echo Doppler study on September 02, 2018 which continued to show normal systolic function with an EF of 55 to 02%, grade 1 diastolic dysfunction, moderate aortic stenosis with a mean gradient of 23 and peak gradient of 37 and mitral annular calcification.  Laboratory from Bonnie Chavez in October 2019 revealed a creatinine of 1.64.    She had a telemedicine evaluation with Bonnie Chavez, her primary physician in March 2020 and saw Bonnie Chavez  and had repeat renal function testing.    I evaluated her on May 12, 2019 in a telemedicine visit. She had remained asymptomatic and specifically denied any  chest pain PND orthopnea, presyncope or syncope.  She denies any palpitations.  At times she notes trivial left ankle edema.    She underwent an echo Doppler study on November 06, 2019 which continued to show an EF of 60 to 65%.  There were no regional wall motion abnormalities.  There was moderate LVH and asymmetric left ventricular hypertrophy.  There was moderate aortic stenosis with a mean gradient of 19 and peak gradient of 36.2.  There was mild PR, trivial MR and TR. I last saw her on November 29, 2019.  At that time she felt well and admitted to just mild episodes of shortness of breath particularly  with walking uphill.  She has had issues with arthritis.  She has continued to see Bonnie Chavez for endocrinologic care and now sees Bonnie Chavez for nephrology since Bonnie. Dr. Jimmy Chavez is about to retire.   I saw her in August 2021.  She underwent laboratory on June 24, 2020 by Bonnie Chavez which showed a creatinine of 1.42.  She had normal LFTs.  TSH was 1.34.  Total cholesterol 160, triglycerides 74, HDL 48, LDL cholesterol 98.  Laboratory by Bonnie Chavez has shown mild vitamin D insufficiency with a vitamin D level of 23 in April 2021.  Creatinine at that time was further increased at 1.74 which subsequently improved.  She denies any chest tightness.  She denied any dizziness presyncope or syncope.  She continued to experience some mild shortness of breath with uphill walking with this without change.  During that evaluation her blood pressure was stable on amlodipine 5 mg, Bystolic 5 mg twice a day, furosemide 40 mg, and lisinopril 10 mg.  She continued to be on rosuvastatin 40 mg for hyperlipidemia.  She was being followed by Bonnie Chavez for her stage III chronic kidney disease.  I recommended that she undergo a follow-up echo Doppler study for reassessment of her aortic valve prior to her next cardiac evaluation.  On February 21, 2021, an echo Doppler study showed normal LV function with EF 60 to 65%, moderate left ventricular hypertrophy and grade 2 diastolic dysfunction.  She had elevated left atrial pressure.  She was felt to have at least moderate least severe aortic stenosis.  Her mean gradient was 24 mm, peak gradient 40.8 mm and aortic valve area at 0.9 cm.  I last saw her on Mar 25, 2021 at which time she remained stable but had experienced some mild shortness of breath with uphill walking.  She typically walks with a cane.  She is unaware of any presyncope or syncope.  She denies any leg edema.  She had recently seen Bonnie Chavez of nephrology and laboratory was obtained in January 2022.  Creatinine  was 1.51 with her estimated GFR at 36 consistent with stage IIIb CKD.    Since I last saw her, she underwent her wellness visit on November 05, 2021 with her primary provider.  She had a follow-up echo Doppler study in November 25, 2020 which showed an EF of 60 to 65% with mild concentric LVH and grade 1 diastolic dysfunction.  Her aortic valve was trileaflet with severe calcification.  There was moderate aortic stenosis with a mean gradient at 20 and peak gradient of 35.6 mmHg.  Aortic valve area was 1.6 cm.    Presently, she continues to walk with a cane.  She denies any chest pain.  [facility]  She has CKD and continues to see  Bonnie. Rebeca Alert a and her creatinine in November 2022 was 1.8.  She denies chest pain, presyncope or syncope, or any significant dyspnea.  She presents for reevaluation.   Past Medical History:  Diagnosis Date   Anemia    History of , resolved   Aortic valve sclerosis    Carpal tunnel syndrome    Chicken pox    Chronic low back pain 02/27/2016   -DDD -seeing [facility]    Diabetes mellitus    type 2, sees Bonnie. Chalmers Cater   Goiter    Bonnie. Chalmers Cater   Heart murmur    History of nuclear stress test 06/2010   dipyridamole; normal pattern of perfusion; ekg negative for ischemia; low risk scan    Hot flashes    Hyperlipidemia    Hyperparathyroidism ([place]) 02/27/2016   -sees endocrinologist, Bonnie. Chalmers Cater, has seen surgeon as well    Hypertension    Migraine    no longer has migraines (80's)   Mitral valve prolapse    Neuropathy    Obesity 02/27/2016   Osteoarthritis    DDD, sees [facility]   PMB (postmenopausal bleeding) 02/28/2009   Resolved   Pneumonia    history of    Renal insufficiency    Bonnie Chavez    Vaginal atrophy    Vitamin D deficiency     Past Surgical History:  Procedure Laterality Date   CESAREAN SECTION  1972 &1976   [place] N/A  05/15/2013   Procedure: [place];  Surgeon: Eldred Manges, MD;  Location: [facility];  Service: Gynecology;  Laterality: N/A;   DILATATION & CURRETTAGE/HYSTEROSCOPY WITH RESECTOCOPE N/A 06/03/2017   Procedure: DILATATION & CURETTAGE/HYSTEROSCOPY;  Surgeon: Eldred Manges, MD;  Location: [facility];  Service: Gynecology;  Laterality: N/A;   [place] SURGERY  2000 2011   right knee replacement    REPLACEMENT TOTAL KNEE  2000   left   SHOULDER SURGERY  2004 2010    TRANSTHORACIC ECHOCARDIOGRAM  07/2010   EF=>55%; LA mildly dilated; trace MR/TR;   TUBAL LIGATION  1976    Allergies  Allergen Reactions   Contrast Media [Iodinated Contrast Media] Other (See Comments)    Reaction unknown   Nsaids Other (See Comments)    Abnormal renal function   Sensipar [Cinacalcet Hcl]     Pt stated, "Made me feel bloated, gassy and fluid buildup"   Penicillins Rash and Other (See Comments)    Pt has taken Keflex without difficulty Has patient had a PCN reaction causing immediate rash, facial/tongue/throat swelling, SOB or lightheadedness with hypotension: yes Has patient had a PCN reaction causing severe rash involving mucus membranes or skin necrosis: no Has patient had a PCN reaction that required hospitalization no Has patient had a PCN reaction occurring within the last 10 years: no If all of the above answers are "NO", then may proceed with Cephalosporin use.    Current Outpatient Medications  Medication Sig Dispense Refill   acetaminophen (TYLENOL) 500 MG tablet Take 1 tablet (500 mg total) by mouth every 4 (four) hours as needed for moderate pain. 60 tablet 0   amLODipine (NORVASC) 5 MG tablet Take 5 mg by mouth daily.     aspirin 81 MG EC tablet Take 81 mg by mouth at bedtime. Swallow whole.  BD PEN NEEDLE NANO 2ND GEN 32G X 4 MM MISC USE AS DIRECTED 90     cinacalcet (SENSIPAR) 30 MG  tablet 1 TABLET MON, WED, FRI, TAKE WITH LARGEST MEAL OF THE DAY     furosemide (LASIX) 20 MG tablet Take 40 mg by mouth daily. Can take additional tablet if needed 30 tablet    gabapentin (NEURONTIN) 100 MG capsule Take 100 mg by mouth at bedtime.     HUMULIN 70/30 KWIKPEN (70-30) 100 UNIT/ML KwikPen Inject 10 Units into the skin every evening.     levothyroxine (SYNTHROID) 50 MCG tablet Take 50 mcg by mouth daily before breakfast.     lisinopril (ZESTRIL) 10 MG tablet TAKE 1 TABLET BY MOUTH EVERY DAY 90 tablet 2   nebivolol (BYSTOLIC) 10 MG tablet TAKE 1/2 TABLET BY MOUTH TWICE A DAY 90 tablet 1   OneTouch Delica Lancets 18E MISC Apply topically.     ONETOUCH VERIO test strip 1 each 2 (two) times daily.     rosuvastatin (CRESTOR) 40 MG tablet TAKE 1 TABLET BY MOUTH EVERYDAY AT BEDTIME 90 tablet 2   cephALEXin (KEFLEX) 500 MG capsule Take 500-1,000 mg by mouth See admin instructions. TAKE ONLY FOR INVASIVE DENTAL PROCEDURES     No current facility-administered medications for this visit.    Social History   Socioeconomic History   Marital status: Divorced    Spouse name: Not on file   Number of children: 2   Years of education: Not on file   Highest education level: Not on file  Occupational History   Not on file  Tobacco Use   Smoking status: Never   Smokeless tobacco: Never  Substance and Sexual Activity   Alcohol use: No    Alcohol/week: 0.0 standard drinks   Drug use: No   Sexual activity: Not on file  Other Topics Concern   Not on file  Social History Narrative   Work or School: none      Home Situation: lives alone, feels safe, son lives nearby      Spiritual Beliefs: Christian      Lifestyle: Y 3x per week, healthy diet       01/05/2019: Lives alone in one level house.   Goes to [employer] to do water aerobics 3X/week   Has son and daughter, son local      Social Determinants of Health   Financial Resource Strain: Low Risk    Difficulty of Paying Living Expenses:  Not hard at all  Food Insecurity: No Food Insecurity   Worried About Charity fundraiser in the Last Year: Never true   Arboriculturist in the Last Year: Never true  Transportation Needs: No Transportation Needs   Lack of Transportation (Medical): No   Lack of Transportation (Non-Medical): No  Physical Activity: Insufficiently Active   Days of Exercise per Week: 2 days   Minutes of Exercise per Session: 60 min  Stress: No Stress Concern Present   Feeling of Stress : Not at all  Social Connections: Moderately Integrated   Frequency of Communication with Friends and Family: More than three times a week   Frequency of Social Gatherings with Friends and Family: More than three times a week   Attends Religious Services: 1 to 4 times per year   Active Member of [employer] or Organizations: Yes   Attends Archivist Meetings: 1 to 4 times per year   Marital Status: Divorced  Human resources officer Violence: Not At  Risk   Fear of Current or Ex-Partner: No   Emotionally Abused: No   Physically Abused: No   Sexually Abused: No   Social history is notable that she's divorced has 2 children one grandchild. She does exercise. There is no tobacco or alcohol use..  Family History  Problem Relation Age of Onset   Hypertension Mother    Diabetes Mother    Hyperlipidemia Mother    Heart attack Mother    Heart disease Father    Heart attack Father    Stroke Brother    Heart disease Brother    Diabetes Brother    Uterine cancer Maternal Grandmother     ROS General: Negative; No fevers, chills, or night sweats; No significant change in weight HEENT: Negative; No changes in vision or hearing, sinus congestion, difficulty swallowing Pulmonary: Negative; No cough, wheezing, shortness of breath, hemoptysis Cardiovascular: See history of present illness GI: Negative; No nausea, vomiting, diarrhea, or abdominal pain GU: Negative; No dysuria, hematuria, or difficulty voiding Musculoskeletal:  Negative; no myalgias, joint pain, or weakness Hematologic/Oncology: Negative; no easy bruising, bleeding Endocrine: Positive for diabetes mellitus, positive for hyperparathyroidism Neuro: Negative; no changes in balance, headaches Skin: Negative; No rashes or skin lesions Psychiatric: Negative; No behavioral problems, depression Sleep: Negative; No snoring, daytime sleepiness, hypersomnolence, bruxism, restless legs, hypnogognic hallucinations, no cataplexy Other comprehensive 14 point system review is negative.   PE BP 108/68    Pulse 65    Ht $R'5\' 4"'Ot$  (1.626 m)    Wt 187 lb 3.2 oz (84.9 kg)    SpO2 99%    BMI 32.13 kg/m    Repeat blood pressure by me was 110/66  Wt Readings from Last 3 Encounters:  12/15/21 188 lb 4 oz (85.4 kg)  12/04/21 187 lb 3.2 oz (84.9 kg)  11/05/21 189 lb 14.4 oz (86.1 kg)   General: Alert, oriented, no distress.  Skin: normal turgor, no rashes, warm and dry HEENT: Normocephalic, atraumatic. Pupils equal round and reactive to light; sclera anicteric; extraocular muscles intact;  Nose without nasal septal hypertrophy Mouth/Parynx benign; Mallinpatti scale 3 Neck: No JVD, no carotid bruits; normal carotid upstroke Lungs: clear to ausculatation and percussion; no wheezing or rales Chest wall: without tenderness to palpitation Heart: PMI not displaced, RRR, s1 s2 normal, 2/6 systolic murmur in the aortic area consistent with her aortic stenosis; no diastolic murmur, no rubs, gallops, thrills, or heaves Abdomen: soft, nontender; no hepatosplenomehaly, BS+; abdominal aorta nontender and not dilated by palpation. Back: no CVA tenderness Pulses 2+ Musculoskeletal: full range of motion, normal strength, no joint deformities Extremities: no clubbing cyanosis or edema, Homan's sign negative  Neurologic: grossly nonfocal; Cranial nerves grossly wnl Psychologic: Normal mood and affect   December 04, 2021 ECG (independently read by me): Sinus rhythm at 65, 1st degree  AV block; PR 280 msec, PACs, LVH  August 2021 ECG (independently read by me): Normal sinus rhythm at 60 bpm.  First-degree AV block with a PR interval at 258 ms.  LVH by voltage in aVL.  No ectopy.  January 2021 ECG (independently read by me): Normal sinus rhythm at 61 bpm with first-degree AV block; PR interval 258 ms.  LVH by voltage criteria in aVL.    November 2019 ECG (independently read by me): Normal sinus rhythm at 61 bpm.  First-degree AV block.  LVH by voltage criteria.  October 2018 ECG (independently read by me): Sinus bradycardia 57 bpm with first-degree AV block.  LVH by voltage criteria.  PR interval 270 ms.  May 2018 ECG (independently read by me): sinus rhythm with first-degree AV block.  LVH by voltage criteria.  PR interval 250 ms.  April 2017 ECG (independently read by me): Normal sinus rhythm at 61 bpm with first-degree AV block with a PR interval of 244 ms.  LVH by voltage.  September 2016 ECG (independently read by me): Normal sinus rhythm at 60 bpm.  First degree block with a PR interval at 252 ms.  No significant ST-T changes.  October 2015 ECG (independently read by me): Sinus bradycardia at 57 beats per minute.  First degree AV block with a PR interval of 236 ms   September 2014 ECG: Sinus rhythm at 60 beats per minute. First grade AV block with PR interval of 244 ms. QTC interval 356 ms  LABS: BMP Latest Ref Rng & Units 10/06/2021 04/10/2021 01/11/2018  Glucose 70 - 99 mg/dL - - -  BUN 4 - 21 47(A) 36(A) 43(A)  Creatinine 0.5 - 1.1 1.8(A) 1.4(A) 1.7(A)  Sodium 137 - 147 139 140 142  Potassium 3.4 - 5.3 4.6 4.1 4.8  Chloride 99 - 108 104 105 -  CO2 13 - 22 27(A) 25(A) -  Calcium 8.7 - 10.7 10.4 10.2 -   Hepatic Function Latest Ref Rng & Units 10/06/2021 04/10/2021 05/24/2014  Total Protein 6.0 - 8.3 g/dL - - 8.0  Albumin 3.5 - 5.0 4.1 4.1 4.0  AST 0 - 37 U/L - - 25  ALT 0 - 35 U/L - - 15  Alk Phosphatase 39 - 117 U/L - - 44  Total Bilirubin 0.2 - 1.2 mg/dL -  - 0.6  Bilirubin, Direct 0.0 - 0.3 mg/dL - - 0.1   CBC Latest Ref Rng & Units 10/06/2021 04/10/2021 12/27/2019  WBC - 6.9 - 7.8  Hemoglobin 12.0 - 16.0 - 11.9(A) 12.2  Hematocrit 36 - 46 11(A) 36 37.0  Platelets 150 - 399 163 - 200.0   Lab Results  Component Value Date   MCV 96.1 12/27/2019   MCV 98.8 11/04/2017   MCV 98.1 05/25/2017   Lab Results  Component Value Date   TSH 2.00 01/30/2016   Lab Results  Component Value Date   HGBA1C 7.3 (A) 12/15/2021   Lab Results  Component Value Date   CALCIUM 10.4 10/06/2021   Lipid Panel     Component Value Date/Time   CHOL 168 11/04/2017 0819   TRIG 90.0 11/04/2017 0819   HDL 55.50 11/04/2017 0819   CHOLHDL 3 11/04/2017 0819   VLDL 18.0 11/04/2017 0819   LDLCALC 95 11/04/2017 0819    RADIOLOGY: No results found.  IMPRESSION:  1. Moderate aortic stenosis   2. Essential hypertension   3. Hyperlipidemia with target LDL less than 70   4. Stage 3b chronic kidney disease ([place])   5. Chronic diastolic congestive heart failure ([place])   6. First degree AV block   7. Type 2 diabetes mellitus with microalbuminuria, with long-term current use of insulin [facility])     ASSESSMENT AND PLAN: Ms. Hersman is a 88 year old African-American female who has a long-standing history of hypertension with grade 1 diastolic dysfunction and normal systolic function documented by her echocardiogram in September 2011. At that time she had aortic valve sclerosis without stenosis and  mitral annular calcification with trace MR and trace TR. over the years she has had progression of her aortic valve disease.  Her echo Doppler study from 08/23/2015 showed an ejection fraction of 55-60%, mild  LVH and grade 1 diastolic dysfunction.  There was very mild aortic stenosis with a mean gradient of 10 and peak gradient of 22.  A 2-year follow-up assessment 08/09/2017 continued to show hyperdynamic LV function with slight increase in her mean gradient at 16 mm with peak of 30  and her October 2019 echo demonstrated mild increase in her gradient with a mean gradient of 23 and peak gradient of 37 mmHg. Her most recent echo of November 06, 2019 revealed  normal systolic function and essentially was unchanged with reference to the severity of aortic stenosis.  Her mean gradient was 19 with peak gradient 36.2.  Her most recent a echo of April 2022 now shows a mean gradient of 24 mm with a peak gradient of 40.8 mmHg and AVA at 0.9 cm.  Her most recent echo from November 25, 2020 shows stability and her mean gradient has reduced from 24-28 with peak gradient from 40.8 down to 35.6.  Estimated valve area is 1.6 cm and is consistent with moderate AS.  Presently, she is asymptomatic with reference to chest pain.  She walks with a cane and may get mild shortness of breath with uphill walking.  She is doing water aerobics without difficulty.  Her blood pressure today is stable and a repeat by me was 110/66.  She has stage IIIb CKD with creatinine 1.8 and she is followed by Bonnie. Theador Hawthorne of nephrology.  She continues to be on amlodipine 5 mg, furosemide 20 mg, lisinopril 10 mg and nebivolol which she is taking 5 mg twice a day.  Her ECG reveals sinus rhythm with previously noted first-degree AV block.  He is on rosuvastatin 40 mg for hyperlipidemia.  Total cholesterol was 164 in October 2022.  She is on levothyroxine for hypothyroidism and insulin for her diabetes mellitus.  I have suggested that if her blood pressure further decreases as long as she is not having significant edema she can change her Lasix from 20 mg daily to as needed.  I have recommended she undergo a follow-up echo Doppler study in October 2023 and will see her in follow-up.    Troy Sine, MD, [facility]  12/20/2021 11:28 AM

## 2021-12-08 ENCOUNTER — Ambulatory Visit: Payer: Medicare Other | Admitting: Family Medicine

## 2021-12-15 ENCOUNTER — Ambulatory Visit (INDEPENDENT_AMBULATORY_CARE_PROVIDER_SITE_OTHER): Payer: Medicare HMO | Admitting: Family Medicine

## 2021-12-15 VITALS — BP 128/68 | HR 57 | Temp 98.0°F | Ht 64.0 in | Wt 188.2 lb

## 2021-12-15 DIAGNOSIS — E039 Hypothyroidism, unspecified: Secondary | ICD-10-CM

## 2021-12-15 DIAGNOSIS — I350 Nonrheumatic aortic (valve) stenosis: Secondary | ICD-10-CM | POA: Diagnosis not present

## 2021-12-15 DIAGNOSIS — N1832 Chronic kidney disease, stage 3b: Secondary | ICD-10-CM | POA: Diagnosis not present

## 2021-12-15 DIAGNOSIS — E1122 Type 2 diabetes mellitus with diabetic chronic kidney disease: Secondary | ICD-10-CM | POA: Diagnosis not present

## 2021-12-15 DIAGNOSIS — Z794 Long term (current) use of insulin: Secondary | ICD-10-CM

## 2021-12-15 DIAGNOSIS — I10 Essential (primary) hypertension: Secondary | ICD-10-CM

## 2021-12-15 LAB — POCT GLYCOSYLATED HEMOGLOBIN (HGB A1C): Hemoglobin A1C: 7.3 % — AB (ref 4.0–5.6)

## 2021-12-15 NOTE — Progress Notes (Signed)
Subjective:    Patient ID: Bonnie Chavez, female    DOB: 08-Dec-1933, 88 y.o.   MRN: [id number]  Chief Complaint  Patient presents with   Follow-up    4 mo  BP has been running low. 109/53 this morning.     HPI Patient was seen today for f/u.  Pt notes hypotension.  BP 105/56, 98/55, 108/58, 128/64, 103/59, 116/61, 114/59.  This am, bp was 109/53.  Got new diabetic shoes with insoles.  Taking bystolic 10 mg BID, norvasc 5 mg at night, lisinopril 10 mg.  BS 119, 155, 200 around dinner and 95-160 in am.  Endorses Cardiology f/u 12/04/21 for AS.    Pt planning to go to [facility] in May for her grandson's HS graduation.  Past Medical History:  Diagnosis Date   Anemia    History of , resolved   Aortic valve sclerosis    Carpal tunnel syndrome    Chicken pox    Chronic low back pain 02/27/2016   -DDD -seeing [facility]    Diabetes mellitus    type 2, sees Dr. Chalmers Cater   Goiter    Dr. Chalmers Cater   Heart murmur    History of nuclear stress test 06/2010   dipyridamole; normal pattern of perfusion; ekg negative for ischemia; low risk scan    Hot flashes    Hyperlipidemia    Hyperparathyroidism ([place]) 02/27/2016   -sees endocrinologist, Dr. Chalmers Cater, has seen surgeon as well    Hypertension    Migraine    no longer has migraines (80's)   Mitral valve prolapse    Neuropathy    Obesity 02/27/2016   Osteoarthritis    DDD, sees [facility]   PMB (postmenopausal bleeding) 02/28/2009   Resolved   Pneumonia    history of    Renal insufficiency    [name]    Vaginal atrophy    Vitamin D deficiency     Allergies  Allergen Reactions   Contrast Media [Iodinated Contrast Media] Other (See Comments)    Reaction unknown   Nsaids Other (See Comments)    Abnormal renal function   Sensipar [Cinacalcet Hcl]     Pt stated, "Made me feel bloated, gassy and fluid buildup"   Penicillins Rash and Other (See Comments)    Pt has taken Keflex without difficulty Has patient had a PCN reaction causing  immediate rash, facial/tongue/throat swelling, SOB or lightheadedness with hypotension: yes Has patient had a PCN reaction causing severe rash involving mucus membranes or skin necrosis: no Has patient had a PCN reaction that required hospitalization no Has patient had a PCN reaction occurring within the last 10 years: no If all of the above answers are "NO", then may proceed with Cephalosporin use.    ROS General: Denies fever, chills, night sweats, changes in weight, changes in appetite HEENT: Denies headaches, ear pain, changes in vision, rhinorrhea, sore throat CV: Denies CP, palpitations, SOB, orthopnea Pulm: Denies SOB, cough, wheezing GI: Denies abdominal pain, nausea, vomiting, diarrhea, constipation GU: Denies dysuria, hematuria, frequency, vaginal discharge Msk: Denies muscle cramps, joint pains Neuro: Denies weakness, numbness, tingling Skin: Denies rashes, bruising Psych: Denies depression, anxiety, hallucinations  Objective:    Blood pressure 128/68, pulse (!) 57, temperature 98 F (36.7 C), temperature source Oral, height 5\' 4"  (1.626 m), weight 188 lb 4 oz (85.4 kg), SpO2 100 %.  Gen. Pleasant, well-nourished, in no distress, normal affect.  Wearing diabetic shoes with molded insoles HEENT: [place]/AT, face symmetric, conjunctiva clear, no  scleral icterus, PERRLA, EOMI, nares patent without drainage Lungs: no accessory muscle use, CTAB, no wheezes or rales Cardiovascular: regularly irregular, 3/6 murmur heard throughout, trace edema b/l to shins.  Sock indention noted superior o ankles Musculoskeletal: No deformities, no cyanosis or clubbing, normal tone Neuro:  A&Ox3, CN II-XII intact, normal gait Skin:  Warm, no lesions/ rash   Wt Readings from Last 3 Encounters:  12/15/21 188 lb 4 oz (85.4 kg)  12/04/21 187 lb 3.2 oz (84.9 kg)  11/05/21 189 lb 14.4 oz (86.1 kg)    Lab Results  Component Value Date   WBC 6.9 10/06/2021   HGB 11.9 (A) 04/10/2021   HCT 11 (A)  10/06/2021   PLT 163 10/06/2021   GLUCOSE 138 (H) 11/04/2017   CHOL 168 11/04/2017   TRIG 90.0 11/04/2017   HDL 55.50 11/04/2017   LDLCALC 95 11/04/2017   ALT 15 05/24/2014   AST 25 05/24/2014   NA 139 10/06/2021   K 4.6 10/06/2021   CL 104 10/06/2021   CREATININE 1.8 (A) 10/06/2021   BUN 47 (A) 10/06/2021   CO2 27 (A) 10/06/2021   TSH 2.00 01/30/2016   INR 1.62 (H) 10/30/2010   HGBA1C 7.3 (A) 12/15/2021   MICROALBUR 0.9 05/24/2014    Assessment/Plan:  Type 2 diabetes mellitus with stage 3b chronic kidney disease, with long-term current use of insulin ([place])  -controlled -Hgb A1C 7.1% on 03/21/21 -Hgb A1C 7.1% this visit -continue humulin 70/30 10 units in evening -foot exam done 08/08/21 -diabetic retinopathy screen negative 01/29/21 -continue ACE I and statin - Plan: POCT glycosylated hemoglobin (Hb A1C)  Essential hypertension -episodes of hypotension in am -Pt to hold Norvasc 5 mg in evening.  Monitor bp.  If elevated can take a half tab of norvasc (2.5mg ). -continue other bp meds including bystolic 5 mg BID, lisinopril 10 mg dialy -continue f/u with Cardiology and Nephrology  Aortic valve stenosis, etiology of cardiac valve disease unspecified -stable -continue ASA 81 mg -continue f/u with Cards  Acquired hypothyroidism -stable -continue synthroid 50 mcg q am -TSH normal at 1.070 on 09/15/21  F/u in the next few months, sooner if needed.  Grier Mitts, MD

## 2021-12-15 NOTE — Patient Instructions (Addendum)
Try holding the Amlodipine 5 mg in the evening to see what your blood pressure readings are.  It they are elevated (greater than 130/80) take half a tab of the amlodipine (2.5 mg).  Your hemoglobin A1C was 7.3% today.

## 2021-12-18 ENCOUNTER — Encounter: Payer: Self-pay | Admitting: Family Medicine

## 2021-12-18 NOTE — Addendum Note (Signed)
Addended by: Grier Mitts R on: 12/18/2021 10:31 PM   Modules accepted: Level of Service

## 2021-12-20 ENCOUNTER — Encounter: Payer: Self-pay | Admitting: Cardiovascular Disease

## 2022-01-30 LAB — HM DIABETES EYE EXAM

## 2022-02-02 ENCOUNTER — Encounter: Payer: Self-pay | Admitting: Family Medicine

## 2022-02-19 ENCOUNTER — Other Ambulatory Visit: Payer: Self-pay | Admitting: Cardiovascular Disease

## 2022-03-16 ENCOUNTER — Encounter: Payer: Self-pay | Admitting: Family Medicine

## 2022-03-16 ENCOUNTER — Ambulatory Visit (INDEPENDENT_AMBULATORY_CARE_PROVIDER_SITE_OTHER): Payer: Medicare HMO | Admitting: Family Medicine

## 2022-03-16 VITALS — BP 143/62 | HR 66 | Temp 98.3°F | Wt 191.4 lb

## 2022-03-16 DIAGNOSIS — E039 Hypothyroidism, unspecified: Secondary | ICD-10-CM

## 2022-03-16 DIAGNOSIS — E1122 Type 2 diabetes mellitus with diabetic chronic kidney disease: Secondary | ICD-10-CM

## 2022-03-16 DIAGNOSIS — B351 Tinea unguium: Secondary | ICD-10-CM

## 2022-03-16 DIAGNOSIS — H6123 Impacted cerumen, bilateral: Secondary | ICD-10-CM

## 2022-03-16 DIAGNOSIS — I10 Essential (primary) hypertension: Secondary | ICD-10-CM

## 2022-03-16 DIAGNOSIS — E782 Mixed hyperlipidemia: Secondary | ICD-10-CM | POA: Diagnosis not present

## 2022-03-16 DIAGNOSIS — I350 Nonrheumatic aortic (valve) stenosis: Secondary | ICD-10-CM | POA: Diagnosis not present

## 2022-03-16 DIAGNOSIS — Z794 Long term (current) use of insulin: Secondary | ICD-10-CM

## 2022-03-16 DIAGNOSIS — M159 Polyosteoarthritis, unspecified: Secondary | ICD-10-CM

## 2022-03-16 DIAGNOSIS — L84 Corns and callosities: Secondary | ICD-10-CM

## 2022-03-16 DIAGNOSIS — N1832 Chronic kidney disease, stage 3b: Secondary | ICD-10-CM

## 2022-03-16 NOTE — Progress Notes (Signed)
Subjective:  ? ? Patient ID: Bonnie Chavez, female    DOB: [birth year], 88 y.o.   MRN: [id number] ? ?Chief Complaint  ?Patient presents with  ? Follow-up  ?  BP, DM last a1c was 7.3 on 12/15/21. Was told to stop the amlodipine  ? ? ?HPI ?Patient was seen today for f/u.  Pt states she has been feeling good for the most part.  Pt states arthritis in bothers her on occasion.  Uses tylenol prn and ice.  Arthritis in hands causing deformities.  Pt notes her neck feels like it is "pulling/a tightness".  Denies neck pain/stiffness.  States it feels like its shrinking.  Denies other muscle soreness or symptoms.  Taking crestor daily.  FSBS 88-141 in am.  BP 105-159/59-70s.  No longer taking norvasc.  Pulse 49-60s at home.  Still having SOB with exertion, but notes some improvement since d/c'ing norvasc.  Has mild edema in LEs.  Taking lasix daily.  Followed by Cardiology.  Pt states she is getting a spot/callus on the medial side of her L 2nd digit.  Wearing shoes with custom insoles she got at a local shoe store.  Pt asks about having her ears check as he noted decreased hearing in L ear despite wearing her hearing aid. ? ?Past Medical History:  ?Diagnosis Date  ? Anemia   ? History of , resolved  ? Aortic valve sclerosis   ? Carpal tunnel syndrome   ? Chicken pox   ? Chronic low back pain 02/27/2016  ? -DDD -seeing [facility]   ? Diabetes mellitus   ? type 2, sees Dr. Chalmers Cater  ? Goiter   ? Dr. Chalmers Cater  ? Heart murmur   ? History of nuclear stress test 06/2010  ? dipyridamole; normal pattern of perfusion; ekg negative for ischemia; low risk scan   ? Hot flashes   ? Hyperlipidemia   ? Hyperparathyroidism ([place]) 02/27/2016  ? -sees endocrinologist, Dr. Chalmers Cater, has seen surgeon as well   ? Hypertension   ? Migraine   ? no longer has migraines (80's)  ? Mitral valve prolapse   ? Neuropathy   ? Obesity 02/27/2016  ? Osteoarthritis   ? DDD, sees [facility]  ? PMB (postmenopausal bleeding) 02/28/2009  ? Resolved  ? Pneumonia   ? history  of   ? Renal insufficiency   ? [name]   ? Vaginal atrophy   ? Vitamin D deficiency   ? ? ?Allergies  ?Allergen Reactions  ? Contrast Media [Iodinated Contrast Media] Other (See Comments)  ?  Reaction unknown  ? Nsaids Other (See Comments)  ?  Abnormal renal function  ? Sensipar [Cinacalcet Hcl]   ?  Pt stated, "Made me feel bloated, gassy and fluid buildup"  ? Penicillins Rash and Other (See Comments)  ?  Pt has taken Keflex without difficulty ?Has patient had a PCN reaction causing immediate rash, facial/tongue/throat swelling, SOB or lightheadedness with hypotension: yes ?Has patient had a PCN reaction causing severe rash involving mucus membranes or skin necrosis: no ?Has patient had a PCN reaction that required hospitalization no ?Has patient had a PCN reaction occurring within the last 10 years: no ?If all of the above answers are "NO", then may proceed with Cephalosporin use.  ? ? ?ROS ?General: Denies fever, chills, night sweats, changes in weight, changes in appetite ?HEENT: Denies headaches, ear pain, changes in vision, rhinorrhea, sore throat +decreased hearing ?CV: Denies CP, palpitations, orthopnea +SOB with exertion ?Pulm: Denies cough,  wheezing +SOB with exertion ?GI: Denies abdominal pain, nausea, vomiting, diarrhea, constipation ?GU: Denies dysuria, hematuria, frequency, vaginal discharge ?Msk: Denies muscle tightness in neck, joint pains  ?Neuro: Denies weakness, numbness, tingling ?Skin: Denies rashes, bruising ?Psych: Denies depression, anxiety, hallucinations ? ?  ?Objective:  ?  ?Blood pressure (!) 143/62, pulse 66, temperature 98.3 ?F (36.8 ?C), temperature source Oral, weight 191 lb 6.4 oz (86.8 kg), SpO2 99 %. ? ? ?Gen. Pleasant, well-nourished, in no distress, normal affect .  Wearing hearing aid in L ear. ?HEENT: [place]/AT, face symmetric, conjunctiva clear, no scleral icterus, PERRLA, EOMI, nares patent without drainage.  B/l canals occluded with cerumen, L>R.  TMs normal after  irrigation. ?Lungs: no accessory muscle use, CTAB, no wheezes or rales ?Cardiovascular: RRR, 3/6 murmur best heard R upper chest, trace LE edema ?Abdomen: BS present, soft, NT/ND, no hepatosplenomegaly. ?Musculoskeletal: No deformities, no cyanosis or clubbing, normal tone ?Neuro:  A&Ox3, CN II-XII intact, normal gait ?Skin:  Warm, no lesions/ rash.  L foot with bunion, callous forming on medial side of left second digit.  Hyperpigmentation of L foot second digit, area forming a corn, but soft.  Onychomycosis. ? ? ?Wt Readings from Last 3 Encounters:  ?03/16/22 191 lb 6.4 oz (86.8 kg)  ?12/15/21 188 lb 4 oz (85.4 kg)  ?12/04/21 187 lb 3.2 oz (84.9 kg)  ? ? ?Lab Results  ?Component Value Date  ? WBC 6.9 10/06/2021  ? HGB 11.9 (A) 04/10/2021  ? HCT 11 (A) 10/06/2021  ? PLT 163 10/06/2021  ? GLUCOSE 138 (H) 11/04/2017  ? CHOL 168 11/04/2017  ? TRIG 90.0 11/04/2017  ? HDL 55.50 11/04/2017  ? [place] 95 11/04/2017  ? ALT 15 05/24/2014  ? AST 25 05/24/2014  ? NA 139 10/06/2021  ? K 4.6 10/06/2021  ? CL 104 10/06/2021  ? CREATININE 1.8 (A) 10/06/2021  ? BUN 47 (A) 10/06/2021  ? CO2 27 (A) 10/06/2021  ? TSH 2.00 01/30/2016  ? INR 1.62 (H) 10/30/2010  ? HGBA1C 7.3 (A) 12/15/2021  ? MICROALBUR 0.9 05/24/2014  ? ? ?Assessment/Plan: ? On day of service, 41 minutes spent caring for this patient face-to-face, reviewing the chart, counseling and/or coordinating care for plan and treatment of diagnosis below.   ? ?Type 2 diabetes mellitus with stage 3b chronic kidney disease, with long-term current use of insulin ([place]) ?-Controlled ?-Hemoglobin A1c 7.3% on 12/15/2021 ?-Continue Humulin 70/30 10 units in evening ?-Continue ACE I and statin ?-Foot exam done 08/08/2021. ?-Eye exam up-to-date done 01/30/2022 ?-We will place referral to podiatry for diabetic shoes ?-Plan: Referral to podiatry ? ?Mixed hyperlipidemia ?-Total cholesterol 164, triglycerides 70, HDL 58 on 09/15/2021 ?-Continue Crestor 40 mg ?-Given concern for myalgia in neck  discussed using Crestor a few times per week instead of daily to see if improvement noted. ? ?Essential hypertension ?-Mildly elevated in clinic.  Typically well controlled ?-recheck BP ?-BP at home well controlled.  Concerned for decreased kidney perfusion with diastolic in the 34L at home. ?-Patient advised to continue monitoring BP and notifying clinic/cardiology for consistently low diastolic readings. ?-Amlodipine 5 mg d/c'd per patient.  Remove from med list. ?-Continue Lasix 20 mg daily, lisinopril 10 mg daily, nebivolol 5 mg twice daily ?-Continue follow-up with cardiology ? ?Nonrheumatic aortic (valve) stenosis ?-Stable ?-Normal on exam ?-Continue current meds including nebivolol 5 mg twice daily, Lasix 20 mg daily, aspirin 81 mg daily ?-Continue follow-up ? ?Bilateral impacted cerumen ?-Per patient affecting hearing despite hearing aid use ?-On exam  bilateral ears with cerumen impaction.  Consent obtained.  Bilateral ears irrigated.  Patient tolerated procedure well. ?-OTC [place] eardrops as needed ?-Continue follow-up with audiology ? ?Primary osteoarthritis involving multiple joints ?-In multiple joints including hands and shoulder ?-Advanced OA in first carpometacarpal and scaphotrapeziodal joints left and right hands per x-rays 12/28/2019 ?-Discussed limited treatment options for osteoarthritis ?-Continue Tylenol as needed, heat, ice, topical analgesics ? ?Onychomycosis ?-Plan: Referral to podiatry ? ?Corns and callus ?-Plan: Referral to Podiatry ? ?Acquired hypothyroidism ?-Stable ?-Continue Synthroid 50 mcg daily ?-TSH 1.070 on 09/15/2021 ? ?F/u in 3-4 months, sooner if needed ? ?Grier Mitts, MD ?

## 2022-03-17 ENCOUNTER — Telehealth: Payer: Self-pay | Admitting: Family Medicine

## 2022-03-17 NOTE — Telephone Encounter (Signed)
Pt calling regarding podiatrist appointment she has scheduled for 03/30/22 830 am with matthew wagoner. Pt also requests that you do not send communication via Mychart as she does not feel computer savvy and will not get those messages. She prefers a phone call. ?

## 2022-03-19 NOTE — Telephone Encounter (Signed)
Noted  

## 2022-03-27 ENCOUNTER — Other Ambulatory Visit: Payer: Self-pay | Admitting: Cardiovascular Disease

## 2022-03-30 ENCOUNTER — Ambulatory Visit (INDEPENDENT_AMBULATORY_CARE_PROVIDER_SITE_OTHER): Payer: Medicare HMO

## 2022-03-30 ENCOUNTER — Ambulatory Visit: Payer: Medicare HMO | Admitting: Podiatry

## 2022-03-30 DIAGNOSIS — M79674 Pain in right toe(s): Secondary | ICD-10-CM

## 2022-03-30 DIAGNOSIS — M2041 Other hammer toe(s) (acquired), right foot: Secondary | ICD-10-CM

## 2022-03-30 DIAGNOSIS — M2141 Flat foot [pes planus] (acquired), right foot: Secondary | ICD-10-CM

## 2022-03-30 DIAGNOSIS — E1149 Type 2 diabetes mellitus with other diabetic neurological complication: Secondary | ICD-10-CM

## 2022-03-30 DIAGNOSIS — M79675 Pain in left toe(s): Secondary | ICD-10-CM | POA: Diagnosis not present

## 2022-03-30 DIAGNOSIS — M2142 Flat foot [pes planus] (acquired), left foot: Secondary | ICD-10-CM

## 2022-03-30 DIAGNOSIS — L84 Corns and callosities: Secondary | ICD-10-CM | POA: Diagnosis not present

## 2022-03-30 DIAGNOSIS — B351 Tinea unguium: Secondary | ICD-10-CM

## 2022-03-30 DIAGNOSIS — M2042 Other hammer toe(s) (acquired), left foot: Secondary | ICD-10-CM

## 2022-03-30 DIAGNOSIS — M21619 Bunion of unspecified foot: Secondary | ICD-10-CM | POA: Diagnosis not present

## 2022-03-30 NOTE — Progress Notes (Signed)
Subjective:  ? ?Patient ID: Bonnie Chavez, female   DOB: 88 y.o.   MRN: [id number]  ? ?HPI ?87 year old female presents the office today with concerns of bilateral foot discomfort which has been ongoing for many years.  She states she for started having discomfort around 1970s.  She says occasionally she gets pain about her right heel and recently intermittent discomfort to the lateral aspect along the fifth metatarsal head.  She is currently not experiencing the pain.  She does have to wear shoes all the time she states has been flat-footed.  She denies recent injury.  She is tried some home stretching exercises intermittently. ? ?Also she has a wart, corn on the left second toe that she had to have trimmed.  Also the nails are thick and elongated causing discomfort as well.  No swelling redness or drainage.  No open lesions that she reports. ? ?Last A1c was 7.1 her last glucose that she reports was 106. ? ?She is on gabapentin. ? ? ?Review of Systems  ?All other systems reviewed and are negative. ? ?Past Medical History:  ?Diagnosis Date  ? Anemia   ? History of , resolved  ? Aortic valve sclerosis   ? Carpal tunnel syndrome   ? Chicken pox   ? Chronic low back pain 02/27/2016  ? -DDD -seeing [facility]   ? Diabetes mellitus   ? type 2, sees Dr. Chalmers Cater  ? Goiter   ? Dr. Chalmers Cater  ? Heart murmur   ? History of nuclear stress test 06/2010  ? dipyridamole; normal pattern of perfusion; ekg negative for ischemia; low risk scan   ? Hot flashes   ? Hyperlipidemia   ? Hyperparathyroidism ([place]) 02/27/2016  ? -sees endocrinologist, Dr. Chalmers Cater, has seen surgeon as well   ? Hypertension   ? Migraine   ? no longer has migraines (80's)  ? Mitral valve prolapse   ? Neuropathy   ? Obesity 02/27/2016  ? Osteoarthritis   ? DDD, sees [facility]  ? PMB (postmenopausal bleeding) 02/28/2009  ? Resolved  ? Pneumonia   ? history of   ? Renal insufficiency   ? [name]   ? Vaginal atrophy   ? Vitamin D deficiency   ? ? ?Past Surgical  History:  ?Procedure Laterality Date  ? [place] &1976  ? CHOLECYSTECTOMY  1991  ? COLONOSCOPY    ? DILATATION & CURRETTAGE/HYSTEROSCOPY WITH RESECTOCOPE N/A 05/15/2013  ? Procedure: [place];  Surgeon: Eldred Manges, MD;  Location: [facility];  Service: Gynecology;  Laterality: N/A;  ? DILATATION & CURRETTAGE/HYSTEROSCOPY WITH RESECTOCOPE N/A 06/03/2017  ? Procedure: DILATATION & CURETTAGE/HYSTEROSCOPY;  Surgeon: Eldred Manges, MD;  Location: [facility];  Service: Gynecology;  Laterality: N/A;  ? [place] OF UTERUS  1975  ? [place]  ? KNEE SURGERY  2000 2011  ? right knee replacement   ? REPLACEMENT TOTAL KNEE  2000  ? left  ? SHOULDER SURGERY  2004 2010   ? TRANSTHORACIC ECHOCARDIOGRAM  07/2010  ? EF=>55%; LA mildly dilated; trace MR/TR;  ? TUBAL LIGATION  1976  ? ? ? ?Current Outpatient Medications:  ?  acetaminophen (TYLENOL) 500 MG tablet, Take 1 tablet (500 mg total) by mouth every 4 (four) hours as needed for moderate pain., Disp: 60 tablet, Rfl: 0 ?  aspirin 81 MG EC tablet, Take 81 mg by mouth at bedtime. Swallow whole., Disp: , Rfl:  ?  BD PEN NEEDLE NANO 2ND GEN 32G X 4 MM MISC, USE AS DIRECTED 90, Disp: , Rfl:  ?  cephALEXin (KEFLEX) 500 MG capsule, Take 500-1,000 mg by mouth See admin instructions. TAKE ONLY FOR INVASIVE DENTAL PROCEDURES, Disp: , Rfl:  ?  cinacalcet (SENSIPAR) 30 MG tablet, 1 TABLET MON, WED, FRI, TAKE WITH LARGEST MEAL OF THE DAY (Patient not taking: Reported on 03/16/2022), Disp: , Rfl:  ?  furosemide (LASIX) 20 MG tablet, Take 40 mg by mouth daily. Can take additional tablet if needed, Disp: 30 tablet, Rfl:  ?  gabapentin (NEURONTIN) 100 MG capsule, Take 100 mg by mouth at bedtime., Disp: , Rfl:  ?  HUMULIN 70/30 KWIKPEN (70-30) 100 UNIT/ML KwikPen, Inject 10 Units into the skin every evening., Disp: , Rfl:  ?  levothyroxine (SYNTHROID) 50 MCG tablet, Take 50 mcg by mouth daily before breakfast.,  Disp: , Rfl:  ?  lisinopril (ZESTRIL) 10 MG tablet, Take 1 tablet (10 mg total) by mouth daily., Disp: 90 tablet, Rfl: 3 ?  nebivolol (BYSTOLIC) 10 MG tablet, TAKE 1/2 TABLET BY MOUTH TWICE A DAY, Disp: 90 tablet, Rfl: 1 ?  OneTouch Delica Lancets 44W MISC, Apply topically., Disp: , Rfl:  ?  ONETOUCH VERIO test strip, 1 each 2 (two) times daily., Disp: , Rfl:  ?  rosuvastatin (CRESTOR) 40 MG tablet, TAKE 1 TABLET BY MOUTH EVERYDAY AT BEDTIME, Disp: 90 tablet, Rfl: 2 ? ?Allergies  ?Allergen Reactions  ? Contrast Media [Iodinated Contrast Media] Other (See Comments)  ?  Reaction unknown  ? Nsaids Other (See Comments)  ?  Abnormal renal function  ? Sensipar [Cinacalcet Hcl]   ?  Pt stated, "Made me feel bloated, gassy and fluid buildup"  ? Penicillins Rash and Other (See Comments)  ?  Pt has taken Keflex without difficulty ?Has patient had a PCN reaction causing immediate rash, facial/tongue/throat swelling, SOB or lightheadedness with hypotension: yes ?Has patient had a PCN reaction causing severe rash involving mucus membranes or skin necrosis: no ?Has patient had a PCN reaction that required hospitalization no ?Has patient had a PCN reaction occurring within the last 10 years: no ?If all of the above answers are "NO", then may proceed with Cephalosporin use.  ? ? ? ? ? ?   ?Objective:  ?Physical Exam  ?General: AAO x3, NAD ? ?Dermatological: Nails are hypertrophic, dystrophic, brittle, discolored, elongated ?10. No surrounding redness or drainage. Tenderness nails 1-5 bilaterally.  Small hyperkeratotic lesion medial second digit IPJ.  No ongoing ulceration drainage or signs of infection.  No open lesions or pre-ulcerative lesions are identified today. ? ?Vascular: Dorsalis Pedis artery and Posterior Tibial artery pedal pulses are 2/4 bilateral with immedate capillary fill time. There is no pain with calf compression, swelling, warmth, erythema.  ? ?Neruologic: Sensation appears to be intact with Thornell Mule  monofilament.  She does take gabapentin. ? ?Musculoskeletal: Decreased medial arch upon weightbearing.  Bunion, hammertoes are present.  Today there is no significant tenderness on the course or insertion of the plantar fascia.  No pain with fifth metatarsal base.  Flexor, extensor tendons appear to be intact.  Muscular strength 5/5 in all groups tested bilateral. ? ?Gait: Unassisted, Nonantalgic.  ? ? ?   ?Assessment:  ? ?87 year old female with symptomatic onychosis, hyperkeratotic lesions; Plantar fasciitis, tendinitis ? ?   ?Plan:  ?-Treatment options discussed including all alternatives, risks, and complications ?-Etiology of symptoms were discussed ?-X-rays were obtained and reviewed with the patient.  Decreased  calcaneal clinician ankles present.  Inferior calcaneal spurring is present.  There is no evidence of acute fracture. ?-Regards to the heel pain and foot pain we discussed topical medications to help with the pain as needed as well as icing.  Discussed traction exercises to be performed regularly.  Consider formal physical therapy as needed.  Discussed shoes and good arch support.  I also think she will benefit from diabetic shoes and will have her follow-up for measurement of this. ?-Sharply debrided hyperkeratotic lesion x1 without any complications or bleeding ?-We debrided nails x10 without any complications or bleeding. ?-Daily foot inspection, glucose control ? ?Trula Slade DPM ? ? ?   ? ?

## 2022-03-30 NOTE — Patient Instructions (Signed)

## 2022-04-23 ENCOUNTER — Encounter: Payer: Self-pay | Admitting: Family Medicine

## 2022-04-23 ENCOUNTER — Ambulatory Visit (INDEPENDENT_AMBULATORY_CARE_PROVIDER_SITE_OTHER): Payer: Medicare HMO | Admitting: Family Medicine

## 2022-04-23 VITALS — BP 134/78 | HR 64 | Temp 98.7°F | Wt 191.2 lb

## 2022-04-23 DIAGNOSIS — J069 Acute upper respiratory infection, unspecified: Secondary | ICD-10-CM

## 2022-04-23 DIAGNOSIS — J029 Acute pharyngitis, unspecified: Secondary | ICD-10-CM

## 2022-04-23 LAB — POC COVID19 BINAXNOW: SARS Coronavirus 2 Ag: NEGATIVE

## 2022-04-23 LAB — POCT RAPID STREP A (OFFICE): Rapid Strep A Screen: NEGATIVE

## 2022-04-23 NOTE — Progress Notes (Signed)
Subjective:    Patient ID: Bonnie Chavez, female    DOB: August 07, 1934, 88 y.o.   MRN: [id number]  Chief Complaint  Patient presents with   Cough    Cough, nasal congestion and sore throat x3 days. Has gotten worse since yday. Took DM tussin, ginfer lemon tea with a little honey, and tylenol.     HPI Patient is an 88 yo female with pmh sig for DM, chronic diastolic CHF, HTN, CKD, b/l sensorineural hearing loss, AV sclerosis, OA, HLD, Hyperparathyroidism who was seen today for acute concern.  Pt endorses dry cough, sore throat, rhinorrhea that started 3 days ago.  Cough seems worse today.  Pt denies fever, chills, n/v, ear pain/pressure, facial pain/pressure.  Pt tried gargling, Diabetic Tussin, and Tylenol for symptoms.  Pt recently returned from [place] for her grandson's graduation.  She was also around her twin grand kids who are in day care.  Past Medical History:  Diagnosis Date   Anemia    History of , resolved   Aortic valve sclerosis    Carpal tunnel syndrome    Chicken pox    Chronic low back pain 02/27/2016   -DDD -seeing [facility]    Diabetes mellitus    type 2, sees Dr. Chalmers Cater   Goiter    Dr. Chalmers Cater   Heart murmur    History of nuclear stress test 06/2010   dipyridamole; normal pattern of perfusion; ekg negative for ischemia; low risk scan    Hot flashes    Hyperlipidemia    Hyperparathyroidism ([place]) 02/27/2016   -sees endocrinologist, Dr. Chalmers Cater, has seen surgeon as well    Hypertension    Migraine    no longer has migraines (80's)   Mitral valve prolapse    Neuropathy    Obesity 02/27/2016   Osteoarthritis    DDD, sees [facility]   PMB (postmenopausal bleeding) 02/28/2009   Resolved   Pneumonia    history of    Renal insufficiency    [name]    Vaginal atrophy    Vitamin D deficiency     Allergies  Allergen Reactions   Contrast Media [Iodinated Contrast Media] Other (See Comments)    Reaction unknown   Nsaids Other (See Comments)    Abnormal renal  function   Sensipar [Cinacalcet Hcl]     Pt stated, "Made me feel bloated, gassy and fluid buildup"   Penicillins Rash and Other (See Comments)    Pt has taken Keflex without difficulty Has patient had a PCN reaction causing immediate rash, facial/tongue/throat swelling, SOB or lightheadedness with hypotension: yes Has patient had a PCN reaction causing severe rash involving mucus membranes or skin necrosis: no Has patient had a PCN reaction that required hospitalization no Has patient had a PCN reaction occurring within the last 10 years: no If all of the above answers are "NO", then may proceed with Cephalosporin use.    ROS General: Denies fever, chills, night sweats, changes in weight, changes in appetite HEENT: Denies headaches, ear pain, changes in vision + rhinorrhea, sore throat CV: Denies CP, palpitations, SOB, orthopnea Pulm: Denies SOB, wheezing  +cough GI: Denies abdominal pain, nausea, vomiting, diarrhea, constipation GU: Denies dysuria, hematuria, frequency, vaginal discharge Msk: Denies muscle cramps, joint pains Neuro: Denies weakness, numbness, tingling Skin: Denies rashes, bruising Psych: Denies depression, anxiety, hallucinations     Objective:    Blood pressure 134/78, pulse 64, temperature 98.7 F (37.1 C), temperature source Oral, weight 191 lb 3.2 oz (86.7  kg), SpO2 97 %.  Gen. Pleasant, well-nourished, in no distress, normal affect   HEENT: [place]/AT, face symmetric, conjunctiva clear, no scleral icterus, PERRLA, EOMI, nares patent without drainage, pharynx without erythema or exudate. Wearing hearing aids.  B/l TMs dull, flat.  Mild cervical lymphadenopathy Lungs: intermittent dry cough, no accessory muscle use, CTAB, no wheezes or rales Cardiovascular: RRR, 3/6 murmur best heard R upper chest, no peripheral edema Musculoskeletal: No deformities, no cyanosis or clubbing, normal tone Neuro:  A&Ox3, CN II-XII intact, normal gait Skin:  Warm, no lesions/  rash  Wt Readings from Last 3 Encounters:  04/23/22 191 lb 3.2 oz (86.7 kg)  03/16/22 191 lb 6.4 oz (86.8 kg)  12/15/21 188 lb 4 oz (85.4 kg)    Lab Results  Component Value Date   WBC 6.9 10/06/2021   HGB 11.9 (A) 04/10/2021   HCT 11 (A) 10/06/2021   PLT 163 10/06/2021   GLUCOSE 138 (H) 11/04/2017   CHOL 168 11/04/2017   TRIG 90.0 11/04/2017   HDL 55.50 11/04/2017   LDLCALC 95 11/04/2017   ALT 15 05/24/2014   AST 25 05/24/2014   NA 139 10/06/2021   K 4.6 10/06/2021   CL 104 10/06/2021   CREATININE 1.8 (A) 10/06/2021   BUN 47 (A) 10/06/2021   CO2 27 (A) 10/06/2021   TSH 2.00 01/30/2016   INR 1.62 (H) 10/30/2010   HGBA1C 7.3 (A) 12/15/2021   MICROALBUR 0.9 05/24/2014    Assessment/Plan:  Viral URI with cough -COVID and strep testing negative -Symptoms likely 2/2 viral URI -Continue expectant management with OTC cough/cold medications for people with DM and HTN.  Continue hydration, rest, fluids, gargling with warm salt water Chloraseptic spray, Vicks VapoRub, Tylenol as needed. -Offered Tessalon however patient declines as previously ineffective. -Given strict precautions  Sore throat  - Plan: POC COVID-19, POC Rapid Strep A  F/u prn  Grier Mitts, MD

## 2022-05-04 ENCOUNTER — Ambulatory Visit (INDEPENDENT_AMBULATORY_CARE_PROVIDER_SITE_OTHER): Payer: Medicare HMO | Admitting: Family Medicine

## 2022-05-04 ENCOUNTER — Encounter: Payer: Self-pay | Admitting: Family Medicine

## 2022-05-04 VITALS — BP 152/80 | HR 87 | Temp 98.1°F | Ht 64.0 in | Wt 186.6 lb

## 2022-05-04 DIAGNOSIS — R058 Other specified cough: Secondary | ICD-10-CM

## 2022-05-04 DIAGNOSIS — R220 Localized swelling, mass and lump, head: Secondary | ICD-10-CM

## 2022-05-04 DIAGNOSIS — L509 Urticaria, unspecified: Secondary | ICD-10-CM | POA: Diagnosis not present

## 2022-05-04 DIAGNOSIS — E1159 Type 2 diabetes mellitus with other circulatory complications: Secondary | ICD-10-CM

## 2022-05-04 DIAGNOSIS — I152 Hypertension secondary to endocrine disorders: Secondary | ICD-10-CM

## 2022-05-04 MED ORDER — AMLODIPINE BESYLATE 5 MG PO TABS: 5.0000 mg | ORAL_TABLET | Freq: Every day | ORAL | 1 refills | Status: DC

## 2022-05-04 NOTE — Patient Instructions (Signed)
Continue to leave off the Lisinopril  Start the Amlodipine 5 mg once daily and make sure to follow up with Dr Volanda Napoleon in one month.

## 2022-05-04 NOTE — Progress Notes (Signed)
Established Patient Office Visit  Subjective   Patient ID: Bonnie Chavez, female    DOB: [birth year]  Age: 88 y.o. MRN: [id number]  Chief Complaint  Patient presents with   Medication Refill    Patient complains of reaction to lisinopril, Patient reports cough, lip swelling and rash     HPI   Patient is seen with possible recent side effects from lisinopril.  She has chronic problems including hypertension, aortic stenosis, hyperparathyroidism, type 2 diabetes, chronic kidney disease.  She states this past Friday she noticed some hives especially on her thighs and waist region.  She had little bit of swelling of her upper lip and had developed some dry cough.  She recognizes these might all be side effects from lisinopril.  She stopped the lisinopril on Friday and took some Benadryl.  Lip swelling has resolved.  No tongue edema.  No dyspnea.  Still has some dry cough but slightly better.  Hives are starting to fade.  No hand or foot edema.  She does take nebivolol 10 mg 1/2 tablet twice daily.  Has taken amlodipine in the past but apparently with 10 mg dose had some hypotension.  Past Medical History:  Diagnosis Date   Anemia    History of , resolved   Aortic valve sclerosis    Carpal tunnel syndrome    Chicken pox    Chronic low back pain 02/27/2016   -DDD -seeing [facility]    Diabetes mellitus    type 2, sees Dr. Chalmers Cater   Goiter    Dr. Chalmers Cater   Heart murmur    History of nuclear stress test 06/2010   dipyridamole; normal pattern of perfusion; ekg negative for ischemia; low risk scan    Hot flashes    Hyperlipidemia    Hyperparathyroidism ([place]) 02/27/2016   -sees endocrinologist, Dr. Chalmers Cater, has seen surgeon as well    Hypertension    Migraine    no longer has migraines (80's)   Mitral valve prolapse    Neuropathy    Obesity 02/27/2016   Osteoarthritis    DDD, sees [facility]   PMB (postmenopausal bleeding) 02/28/2009   Resolved   Pneumonia    history of    Renal  insufficiency    [name]    Vaginal atrophy    Vitamin D deficiency    Past Surgical History:  Procedure Laterality Date   CESAREAN SECTION  1972 &1976   [place] N/A 05/15/2013   Procedure: [place];  Surgeon: Eldred Manges, MD;  Location: [facility];  Service: Gynecology;  Laterality: N/A;   DILATATION & CURRETTAGE/HYSTEROSCOPY WITH RESECTOCOPE N/A 06/03/2017   Procedure: DILATATION & CURETTAGE/HYSTEROSCOPY;  Surgeon: Eldred Manges, MD;  Location: [facility];  Service: Gynecology;  Laterality: N/A;   [place] SURGERY  2000 2011   right knee replacement    REPLACEMENT TOTAL KNEE  2000   left   SHOULDER SURGERY  2004 2010    TRANSTHORACIC ECHOCARDIOGRAM  07/2010   EF=>55%; LA mildly dilated; trace MR/TR;   TUBAL LIGATION  1976    reports that she has never smoked. She has never used smokeless tobacco. She reports that she does not drink alcohol and does not use drugs. family history includes Diabetes in her brother and mother; Heart attack in her father  and mother; Heart disease in her brother and father; Hyperlipidemia in her mother; Hypertension in her mother; Stroke in her brother; Uterine cancer in her maternal grandmother. Allergies  Allergen Reactions   Contrast Media [Iodinated Contrast Media] Other (See Comments)    Reaction unknown   Lisinopril Hives    Cough, hives, and lip edema    Nsaids Other (See Comments)    Abnormal renal function   Sensipar [Cinacalcet Hcl]     Pt stated, "Made me feel bloated, gassy and fluid buildup"   Penicillins Rash and Other (See Comments)    Pt has taken Keflex without difficulty Has patient had a PCN reaction causing immediate rash, facial/tongue/throat swelling, SOB or lightheadedness with hypotension: yes Has patient had a PCN reaction  causing severe rash involving mucus membranes or skin necrosis: no Has patient had a PCN reaction that required hospitalization no Has patient had a PCN reaction occurring within the last 10 years: no If all of the above answers are "NO", then may proceed with Cephalosporin use.    Review of Systems  Constitutional:  Negative for malaise/fatigue.  Eyes:  Negative for blurred vision.  Respiratory:  Negative for shortness of breath.   Cardiovascular:  Negative for chest pain.  Gastrointestinal:  Negative for abdominal pain.  Genitourinary:  Negative for dysuria.  Skin:  Positive for rash.  Neurological:  Negative for dizziness, weakness and headaches.      Objective:     BP (!) 152/80 (BP Location: Left Arm, Cuff Size: Normal)   Pulse 87   Temp 98.1 F (36.7 C) (Oral)   Ht '5\' 4"'$  (1.626 m)   Wt 186 lb 9.6 oz (84.6 kg)   SpO2 97%   BMI 32.03 kg/m    Physical Exam Constitutional:      Appearance: She is well-developed.  HENT:     Head: Normocephalic and atraumatic.     Mouth/Throat:     Mouth: Mucous membranes are moist.     Pharynx: Oropharynx is clear.  Eyes:     Pupils: Pupils are equal, round, and reactive to light.  Neck:     Thyroid: No thyromegaly.     Vascular: No JVD.  Cardiovascular:     Rate and Rhythm: Normal rate and regular rhythm.     Heart sounds:     No gallop.  Pulmonary:     Effort: Pulmonary effort is normal. No respiratory distress.     Breath sounds: Normal breath sounds. No wheezing or rales.  Musculoskeletal:     Cervical back: Neck supple.     Right lower leg: No edema.     Left lower leg: No edema.  Neurological:     Mental Status: She is alert.      No results found for any visits on 05/04/22.    The ASCVD Risk score (Arnett DK, et al., 2019) failed to calculate for the following reasons:   The 2019 ASCVD risk score is only valid for ages 15 to 60    Assessment & Plan:   #1 possible adverse reaction with ACE inhibitor with  some transient upper lip swelling, dry cough, hives.  Symptoms improving after stopping ACE inhibitor a few days ago.  Continue to leave off ACE inhibitor. May continue over-the-counter antihistamine as needed for hives which seem to be fading  #2 hypertension poorly controlled.  Recent discontinuation of ACE inhibitor as above.  Start back low-dose amlodipine 5 mg daily. -Set up follow-up with primary within 1 month  to reassess  No follow-ups on file.    Carolann Littler, MD

## 2022-05-06 ENCOUNTER — Telehealth: Payer: Self-pay | Admitting: Cardiovascular Disease

## 2022-05-06 NOTE — Telephone Encounter (Signed)
Pt c/o medication issue:  1. Name of Medication: lisinopril   2. How are you currently taking this medication (dosage and times per day)?    3. Are you having a reaction (difficulty breathing--STAT)? no  4. What is your medication issue? Patient stop taking medication do to dry cough, hives, and swelling of the lips and left ankle. Patient PCP took her off the medication. Please advise

## 2022-05-06 NOTE — Telephone Encounter (Signed)
Noted. Advised that she should stay off lisinopril and take new medication prescribed amlodipine 5 mg daily. Advised to monitor home BP's. Verbalized understanding.

## 2022-06-05 ENCOUNTER — Ambulatory Visit (INDEPENDENT_AMBULATORY_CARE_PROVIDER_SITE_OTHER): Payer: Medicare HMO | Admitting: Family Medicine

## 2022-06-05 ENCOUNTER — Encounter: Payer: Self-pay | Admitting: Family Medicine

## 2022-06-05 VITALS — BP 132/60 | HR 67 | Temp 98.3°F | Wt 189.0 lb

## 2022-06-05 DIAGNOSIS — I10 Essential (primary) hypertension: Secondary | ICD-10-CM

## 2022-06-05 DIAGNOSIS — N1832 Chronic kidney disease, stage 3b: Secondary | ICD-10-CM | POA: Diagnosis not present

## 2022-06-05 DIAGNOSIS — E1122 Type 2 diabetes mellitus with diabetic chronic kidney disease: Secondary | ICD-10-CM

## 2022-06-05 DIAGNOSIS — T50905D Adverse effect of unspecified drugs, medicaments and biological substances, subsequent encounter: Secondary | ICD-10-CM

## 2022-06-05 DIAGNOSIS — Z794 Long term (current) use of insulin: Secondary | ICD-10-CM

## 2022-06-05 NOTE — Progress Notes (Signed)
Subjective:    Patient ID: Bonnie Chavez, female    DOB: [birth year], 88 y.o.   MRN: [id number]  Chief Complaint  Patient presents with   Follow-up    For BP.    Medication Reaction    To Lisinopril. Pt reports she has reaction to lisinopril. Dry cough, lip swell, hives, itching. Noticed it on the 9th of June. Patient stop taking the medication    HPI Patient was seen today for f/u.  Patient states overall doing okay endorses some fatigue.  Patient seen 05/04/22 for reaction due to lisinopril.  Patient has been on lisinopril for numerous yrs.  States last dose was on 6/9.  Pt experienced lip edema, dry cough, hives.  Took Benadryl.  Pt notes improvement in symptoms but notices an occasional cough.  Currently taking nebivolol 5 mg twice daily and Norvasc 5 mg daily for BP.  In the past Norvasc was stopped as it caused drowsiness.  Wearing compression socks notes edema at the end of the day that resolves in the morning, LLE>RLE.  Patient notes since allergic reaction blood sugar has been slightly more elevated.  Blood sugar this morning 150.  Patient endorses labs with nephrology for chronic kidney disease.  Sodium 136, potassium 4.8, chloride 101, BUN 30, creatinine 1.32, EGFR 39, calcium 9.8 PTH 86, vitamin D 19.3, hemoglobin 11.2, MCV 79, platelets 240, iron 47, ferritin 336 on 05/07/2022.  Past Medical History:  Diagnosis Date   Anemia    History of , resolved   Aortic valve sclerosis    Carpal tunnel syndrome    Chicken pox    Chronic low back pain 02/27/2016   -DDD -seeing [facility]    Diabetes mellitus    type 2, sees Dr. Chalmers Cater   Goiter    Dr. Chalmers Cater   Heart murmur    History of nuclear stress test 06/2010   dipyridamole; normal pattern of perfusion; ekg negative for ischemia; low risk scan    Hot flashes    Hyperlipidemia    Hyperparathyroidism ([place]) 02/27/2016   -sees endocrinologist, Dr. Chalmers Cater, has seen surgeon as well    Hypertension    Migraine    no longer has  migraines (80's)   Mitral valve prolapse    Neuropathy    Obesity 02/27/2016   Osteoarthritis    DDD, sees [facility]   PMB (postmenopausal bleeding) 02/28/2009   Resolved   Pneumonia    history of    Renal insufficiency    [name]    Vaginal atrophy    Vitamin D deficiency     Allergies  Allergen Reactions   Contrast Media [Iodinated Contrast Media] Other (See Comments)    Reaction unknown   Lisinopril Hives    Cough, hives, and lip edema    Nsaids Other (See Comments)    Abnormal renal function   Sensipar [Cinacalcet Hcl]     Pt stated, "Made me feel bloated, gassy and fluid buildup"   Penicillins Rash and Other (See Comments)    Pt has taken Keflex without difficulty Has patient had a PCN reaction causing immediate rash, facial/tongue/throat swelling, SOB or lightheadedness with hypotension: yes Has patient had a PCN reaction causing severe rash involving mucus membranes or skin necrosis: no Has patient had a PCN reaction that required hospitalization no Has patient had a PCN reaction occurring within the last 10 years: no If all of the above answers are "NO", then may proceed with Cephalosporin use.    ROS General:  Denies fever, chills, night sweats, changes in weight, changes in appetite + fatigue HEENT: Denies headaches, ear pain, changes in vision, rhinorrhea, sore throat CV: Denies CP, palpitations, SOB, orthopnea  + LE edema by the end of the day Pulm: Denies SOB, cough, wheezing GI: Denies abdominal pain, nausea, vomiting, diarrhea, constipation GU: Denies dysuria, hematuria, frequency, vaginal discharge Msk: Denies muscle cramps, joint pains Neuro: Denies weakness, numbness, tingling Skin: Denies rashes, bruising  + bunions Psych: Denies depression, anxiety, hallucinations     Objective:    Blood pressure 132/60, pulse 67, temperature 98.3 F (36.8 C), temperature source Oral, weight 189 lb (85.7 kg), SpO2 96 %.  Gen. Pleasant, well-nourished, in no  distress, normal affect   HEENT: [place]/AT, face symmetric, conjunctiva clear, no scleral icterus, PERRLA, EOMI, nares patent without drainage Lungs: no accessory muscle use, CTAB, no wheezes or rales Cardiovascular: RRR, no m/r/g, no peripheral edema Abdomen: BS present, soft, NT/ND Musculoskeletal: No deformities, no cyanosis or clubbing, normal tone Neuro:  A&Ox3, CN II-XII intact, normal gait Skin:  Warm, no lesions/ rash.  Bilateral bunions.   Wt Readings from Last 3 Encounters:  06/05/22 189 lb (85.7 kg)  05/04/22 186 lb 9.6 oz (84.6 kg)  04/23/22 191 lb 3.2 oz (86.7 kg)    Lab Results  Component Value Date   WBC 6.9 10/06/2021   HGB 11.9 (A) 04/10/2021   HCT 11 (A) 10/06/2021   PLT 163 10/06/2021   GLUCOSE 138 (H) 11/04/2017   CHOL 168 11/04/2017   TRIG 90.0 11/04/2017   HDL 55.50 11/04/2017   LDLCALC 95 11/04/2017   ALT 15 05/24/2014   AST 25 05/24/2014   NA 139 10/06/2021   K 4.6 10/06/2021   CL 104 10/06/2021   CREATININE 1.8 (A) 10/06/2021   BUN 47 (A) 10/06/2021   CO2 27 (A) 10/06/2021   TSH 2.00 01/30/2016   INR 1.62 (H) 10/30/2010   HGBA1C 7.3 (A) 12/15/2021   MICROALBUR 0.9 05/24/2014    Assessment/Plan:  Essential hypertension -Controlled -Continue to avoid ACE I's due to allergy to lisinopril-caused lip edema, hives, dry cough. -Continue nebivolol 5 mg twice daily, Norvasc 5 mg daily.  Monitor for increased drowsiness as in the past Norvasc seem to cause drowsiness. -Continue to monitor BP.  Discussed other blood pressure medication options for consistent elevation in BP greater than 140/90 such as hydralazine, other calcium channel blocker, ARB.  Type 2 diabetes mellitus with stage 3b chronic kidney disease, with long-term current use of insulin (HCC) -Hemoglobin A1c 7.3% on 12/15/2021 -Continue to monitor blood sugar as recently increased due to allergic reaction.  Appears to be downtrending -Continue Humulin 70/30 10 units every morning -Continue  statin -Foot exam due at next OF[facility] (on or after 08/08/2021) -Diabetic retinopathy screening up-to-date done 01/30/2022 -Continue follow-up with nephrology.  Labs reviewed from 05/07/2022.  Adverse effect of drug, subsequent encounter -Improving since stopping lisinopril  F/u in 3-4 months, sooner if needed  Grier Mitts, MD

## 2022-06-23 ENCOUNTER — Other Ambulatory Visit: Payer: Self-pay | Admitting: Cardiovascular Disease

## 2022-06-30 ENCOUNTER — Ambulatory Visit: Payer: Medicare HMO | Admitting: Podiatry

## 2022-06-30 DIAGNOSIS — E1149 Type 2 diabetes mellitus with other diabetic neurological complication: Secondary | ICD-10-CM | POA: Diagnosis not present

## 2022-06-30 DIAGNOSIS — M79675 Pain in left toe(s): Secondary | ICD-10-CM | POA: Diagnosis not present

## 2022-06-30 DIAGNOSIS — M2141 Flat foot [pes planus] (acquired), right foot: Secondary | ICD-10-CM

## 2022-06-30 DIAGNOSIS — M2041 Other hammer toe(s) (acquired), right foot: Secondary | ICD-10-CM | POA: Diagnosis not present

## 2022-06-30 DIAGNOSIS — M2042 Other hammer toe(s) (acquired), left foot: Secondary | ICD-10-CM | POA: Diagnosis not present

## 2022-06-30 DIAGNOSIS — M79674 Pain in right toe(s): Secondary | ICD-10-CM | POA: Diagnosis not present

## 2022-06-30 DIAGNOSIS — M2142 Flat foot [pes planus] (acquired), left foot: Secondary | ICD-10-CM | POA: Diagnosis not present

## 2022-06-30 DIAGNOSIS — B351 Tinea unguium: Secondary | ICD-10-CM | POA: Diagnosis not present

## 2022-06-30 MED ORDER — CICLOPIROX 8 % EX SOLN: Freq: Every day | CUTANEOUS | 2 refills | Status: DC

## 2022-06-30 NOTE — Patient Instructions (Signed)
Ciclopirox Topical Solution What is this medication? CICLOPIROX (sye kloe PEER ox) treats fungal infections of the nails. It belongs to a group of medications called antifungals. It will not treat infections caused by bacteria or viruses. This medicine may be used for other purposes; ask your health care provider or pharmacist if you have questions. COMMON BRAND NAME(S): Ciclodan Nail Solution, CNL8, [place] What should I tell my care team before I take this medication? They need to know if you have any of these conditions: Diabetes (high blood sugar) Immune system problems Organ transplant Receiving steroid inhalers, cream, or lotion Seizures Tingling of the fingers or toes or other nerve disorder An unusual or allergic reaction to ciclopirox, other medications, foods, dyes, or preservatives Pregnant or trying to get pregnant Breast-feeding How should I use this medication? This medication is for external use only. Do not take by mouth. Wash your hands before and after use. If you are treating your hands, only wash your hands before use. Do not get it in your eyes. If you do, rinse your eyes with plenty of cool tap water. Use it as directed on the prescription label at the same time every day. Do not use it more often than directed. Use the medication for the full course as directed by your care team, even if you think you are better. Do not stop using it unless your care team tells you to stop it early. Apply a thin film of the medication to the affected area. Talk to your care team about the use of this medication in children. While it may be prescribed for children as young as 12 years for selected conditions, precautions do apply. Overdosage: If you think you have taken too much of this medicine contact a poison control center or emergency room at once. NOTE: This medicine is only for you. Do not share this medicine with others. What if I miss a dose? If you miss a dose, use it as soon as  you can. If it is almost time for your next dose, use only that dose. Do not use double or extra doses. What may interact with this medication? Interactions are not expected. Do not use any other skin products without telling your care team. This list may not describe all possible interactions. Give your health care provider a list of all the medicines, herbs, non-prescription drugs, or dietary supplements you use. Also tell them if you smoke, drink alcohol, or use illegal drugs. Some items may interact with your medicine. What should I watch for while using this medication? Visit your care team for regular checks on your progress. It may be some time before you see the benefit from this medication. Do not use nail polish or other nail cosmetic products on the treated nails. Removal of the unattached, infected nail by your care team is needed with use of this medication. If you have diabetes or numbness in your fingers or toes, talk to your care team about proper nail care. What side effects may I notice from receiving this medication? Side effects that you should report to your care team as soon as possible: Allergic reactions--skin rash, itching, hives, swelling of the face, lips, tongue, or throat Burning, itching, crusting, or peeling of treated skin Side effects that usually do not require medical attention (report to your care team if they continue or are bothersome): Change in nail shape, thickness, or color Mild skin irritation, redness, or dryness This list may not describe all possible side   effects. Call your doctor for medical advice about side effects. You may report side effects to FDA at 1-800-FDA-1088. Where should I keep my medication? Keep out of the reach of children and pets. Store at room temperature between 20 and 25 degrees C (68 and 77 degrees F). This medication is flammable. Avoid exposure to heat, fire, flame, and smoking. Get rid of medications that are no longer needed  or have expired: Take the medication to a medication take-back program. Check with your pharmacy or law enforcement to find a location. If you cannot return the medication, check the label or package insert to see if the medication should be thrown out in the garbage or flushed down the toilet. If you are not sure, ask your care team. If it is safe to put in the trash, take the medication out of the container. Mix the medication with cat litter, dirt, coffee grounds, or other unwanted substance. Seal the mixture in a bag or container. Put it in the trash. NOTE: This sheet is a summary. It may not cover all possible information. If you have questions about this medicine, talk to your doctor, pharmacist, or health care provider.  2023 Elsevier/Gold Standard (2021-10-21 00:00:00)  

## 2022-06-30 NOTE — Progress Notes (Signed)
Subjective: Chief Complaint  Patient presents with   Hammer Toe    Pt came in for a follow-up for hammertoes bilateral, still having foot pain, rate of pain 6 out of 10, A1c- 7.1 BG-152, Patient would like a nail trim today,    Occasional pain the lateral foot but not now and occasional pain right lateral ankle, no current pain. No recent injury.  She is in the nails still looks like and discolored.  No drainage or pus come to the toenail sites.  No open lesions.  Arthritis flare in left hip.   Objective: AAO x3, NAD DP/PT pulses palpable bilaterally, CRT less than 3 seconds Nails continue be hypertrophic, dystrophic with yellow, brown discoloration.  There is no edema no erythema.  Tenderness nails 1-5 bilaterally. Hammertoes present.  Not able to elicit any area of tenderness to the ankle or the foot otherwise.  No pain with range of motion.  Left foot present. No pain with calf compression, swelling, warmth, erythema  Assessment: Symptomatic onychomycosis  Plan: -All treatment options discussed with the patient including all alternatives, risks, complications.  -Sharply debrided the nails x 10 without any complications or bleeding -[place]lac -Continue supportive shoe gear we discussed stretching exercises daily.  Not able to elicit any significant tenderness today to the lateral aspect of her ankle where she gets occasional discomfort. -Offloading for hammertoes. -Patient encouraged to call the office with any questions, concerns, change in symptoms.   Trula Slade DPM

## 2022-07-16 ENCOUNTER — Ambulatory Visit: Payer: Medicare HMO | Admitting: Family Medicine

## 2022-08-15 ENCOUNTER — Other Ambulatory Visit: Payer: Self-pay | Admitting: Cardiovascular Disease

## 2022-09-03 ENCOUNTER — Ambulatory Visit (HOSPITAL_COMMUNITY): Payer: Medicare HMO | Attending: Cardiovascular Disease

## 2022-09-03 DIAGNOSIS — I350 Nonrheumatic aortic (valve) stenosis: Secondary | ICD-10-CM | POA: Diagnosis present

## 2022-09-03 LAB — ECHOCARDIOGRAM COMPLETE
AR max vel: 0.74 cm2
AV Area VTI: 0.83 cm2
AV Area mean vel: 0.61 cm2
AV Mean grad: 24 mmHg
AV Peak grad: 36 mmHg
Ao pk vel: 3 m/s
Area-P 1/2: 2.45 cm2
S' Lateral: 2.8 cm

## 2022-10-05 ENCOUNTER — Ambulatory Visit: Payer: Medicare HMO | Admitting: Podiatry

## 2022-10-05 DIAGNOSIS — E1149 Type 2 diabetes mellitus with other diabetic neurological complication: Secondary | ICD-10-CM | POA: Diagnosis not present

## 2022-10-05 DIAGNOSIS — M79675 Pain in left toe(s): Secondary | ICD-10-CM | POA: Diagnosis not present

## 2022-10-05 DIAGNOSIS — M79674 Pain in right toe(s): Secondary | ICD-10-CM

## 2022-10-05 DIAGNOSIS — B351 Tinea unguium: Secondary | ICD-10-CM

## 2022-10-05 NOTE — Progress Notes (Unsigned)
Subjective: Chief Complaint  Patient presents with   Diabetes    Diabetic foot care, A1c-8.4 BG- 135. Nail trim, patient is having pain on the lateral side of the feet, left is worse than the right, rate of pain 8 out of 31.     88 year old female presents for above concerns.  She presents today for thick, elongated nails that she is unable to trim her self.  No swelling redness or any drainage.  No open lesions.  She states she still gets occasional pain to her feet but she is not expensing any pain today.  She is interested in diabetic shoes.     Objective: AAO x3, NAD DP/PT pulses palpable bilaterally, CRT less than 3 seconds Nails continue be hypertrophic, dystrophic with yellow, brown discoloration.  There is no edema no erythema.  Tenderness nails 1-5 bilaterally. Hammertoes present.  Not able to elicit any area of tenderness to the ankle or the foot otherwise.  No pain with range of motion.   Bunion, hammertoes present.  Flatfoot present. No pain with calf compression, swelling, warmth, erythema  Assessment: Symptomatic onychomycosis  Plan: -All treatment options discussed with the patient including all alternatives, risks, complications.  -Sharply debrided the nails x 10 without any complications or bleeding.  -We discussed diabetic shoes.  She has previous mid that is with neuropathy and she is on gabapentin.  She also has bunion and hammertoe digital deformity.  Do think she will benefit from diabetic shoes.  Unfortunately not able to do in the office right now and will start again in January.  Offered to go to an outside office to have this performed but she states that she will wait.  Continue with good stretching, icing as well as supportive shoes for now.  Trula Slade DPM

## 2022-10-05 NOTE — Patient Instructions (Signed)
For inserts I like powersteps, superfeet, aetrex  ---   WEARING INSTRUCTIONS FOR ORTHOTICS  Don't expect to be comfortable wearing your orthotic devices for the first time.  Like eyeglasses, you may be aware of them as time passes, they will not be uncomfortable and you will enjoy wearing them.  FOLLOW THESE INSTRUCTIONS EXACTLY!  Wear your orthotic devices for:       Not more than 1 hour the first day.       Not more than 2 hours the second day.       Not more than 3 hours the third day and so on.        Or wear them for as long as they feel comfortable.       If you experience discomfort in your feet or legs take them out.  When feet & legs feel       better, put them back in.  You do need to be consistent and wear them a little        everyday. 2.   If at any time the orthotic devices become acutely uncomfortable before the       time for that particular day, STOP WEARING THEM. 3.   On the next day, do not increase the wearing time. 4.   Subsequently, increase the wearing time by 15-30 minutes only if comfortable to do       so. 5.   You will be seen by your doctor about 2-4 weeks after you receive your orthotic       devices, at which time you will probably be wearing your devices comfortably        for about 8 hours or more a day. 6.   Some patients occasionally report mild aches or discomfort in other parts of the of       body such as the knees, hips or back after 3 or 4 consecutive hours of wear.  If this       is the case with you, do not extend your wearing time.  Instead, cut it back an hour or       two.  In all likelihood, these symptoms will disappear in a short period of time as your       body posture realigns itself and functions more efficiently. 7.   It is possible that your orthotic device may require some small changes or adjustment       to improve their function or make them more comfortable.   This is usually not done       before one to three months have  elapsed.  These adjustments are made in        accordance with the changed position your feet are assuming as a result of       improved biomechanical function. 8.   In women's shoes, it's not unusual for your heel to slip out of the shoe, particularly if       they are step-in-shoes.  If this is the case, try other shoes or other styles.  Try to       purchase shoes which have deeper heal seats or higher heel counters. 9.   Squeaking of orthotics devices in the shoes is due to the movement of the devices       when they are functioning normally.  To eliminate squeaking, simply dust some       baby powder into your shoes before inserting the devices.  If this does not work,        apply soap or wax to the edges of the orthotic devices or put a tissue into the shoes. 10. It is important that you follow these directions explicitly.  Failure to do so will simply       prolong the adjustment period or create problems which are easily avoided.  It makes       no difference if you are wearing your orthotic devices for only a few hours after        several months, so long as you are wearing them comfortably for those hours. 11. If you have any questions or complaints, contact our office.  We have no way of       knowing about your problems unless you tell us.  If we do not hear from you, we will       assume that you are proceeding well.

## 2022-10-07 ENCOUNTER — Ambulatory Visit (INDEPENDENT_AMBULATORY_CARE_PROVIDER_SITE_OTHER): Payer: Medicare HMO | Admitting: Family Medicine

## 2022-10-07 VITALS — BP 130/62 | HR 92 | Temp 98.0°F | Ht 63.25 in | Wt 185.0 lb

## 2022-10-07 DIAGNOSIS — R232 Flushing: Secondary | ICD-10-CM | POA: Diagnosis not present

## 2022-10-07 DIAGNOSIS — E1122 Type 2 diabetes mellitus with diabetic chronic kidney disease: Secondary | ICD-10-CM | POA: Diagnosis not present

## 2022-10-07 DIAGNOSIS — M25552 Pain in left hip: Secondary | ICD-10-CM

## 2022-10-07 DIAGNOSIS — I350 Nonrheumatic aortic (valve) stenosis: Secondary | ICD-10-CM

## 2022-10-07 DIAGNOSIS — Z794 Long term (current) use of insulin: Secondary | ICD-10-CM

## 2022-10-07 DIAGNOSIS — N1832 Chronic kidney disease, stage 3b: Secondary | ICD-10-CM

## 2022-10-07 NOTE — Patient Instructions (Addendum)
You can try using Voltaren gel for your hip pain.  It can be found over-the-counter at your local drugstore.  Black cohosh for hot flashes can also be found over-the-counter at your local drugstore.  Hoy Register is another supplement that can help with hot flashes.

## 2022-10-07 NOTE — Progress Notes (Signed)
Subjective:    Patient ID: Bonnie Chavez, female    DOB: 1934-02-02, 88 y.o.   MRN: [id number]  Chief Complaint  Patient presents with   Follow-up    BP and DM DM on 30th 8.4. blood sugar was 119 this morning.    HPI Patient was seen today for f/u.  Pt seen by Endo, Dr. Suzette Battiest recently.  Humulin 70/30 increased, now taking 4 units in am in addition to 10 units in pm.  Pt had a few symptomatic low bs readings, 80s.  Otherwise blood sugar 84-146.  BP typically 100-120/60-80s at home.  Had Echo in Aug or Sept with mod to severe AS. Denies HAs, dizziness, syncope.  Does note decreased energy.  HAs f/u with Nephrology in a few wks for CKD 3  Patient endorses lateral L thigh/hip pain.  Denies catching sensation.  Occasionally has tingling pt and lateral.  Is also noticing occasional pain down leg.  Denies current back pain or hip pain.  Past Medical History:  Diagnosis Date   Anemia    History of , resolved   Aortic valve sclerosis    Carpal tunnel syndrome    Chicken pox    Chronic low back pain 02/27/2016   -DDD -seeing [facility]    Diabetes mellitus    type 2, sees Dr. Chalmers Cater   Goiter    Dr. Chalmers Cater   Heart murmur    History of nuclear stress test 06/2010   dipyridamole; normal pattern of perfusion; ekg negative for ischemia; low risk scan    Hot flashes    Hyperlipidemia    Hyperparathyroidism ([place]) 02/27/2016   -sees endocrinologist, Dr. Chalmers Cater, has seen surgeon as well    Hypertension    Migraine    no longer has migraines (80's)   Mitral valve prolapse    Neuropathy    Obesity 02/27/2016   Osteoarthritis    DDD, sees [facility]   PMB (postmenopausal bleeding) 02/28/2009   Resolved   Pneumonia    history of    Renal insufficiency    [name]    Vaginal atrophy    Vitamin D deficiency     Allergies  Allergen Reactions   Contrast Media [Iodinated Contrast Media] Other (See Comments)    Reaction unknown   Lisinopril Hives    Cough, hives, and lip edema     Nsaids Other (See Comments)    Abnormal renal function   Sensipar [Cinacalcet Hcl]     Pt stated, "Made me feel bloated, gassy and fluid buildup"   Penicillins Rash and Other (See Comments)    Pt has taken Keflex without difficulty Has patient had a PCN reaction causing immediate rash, facial/tongue/throat swelling, SOB or lightheadedness with hypotension: yes Has patient had a PCN reaction causing severe rash involving mucus membranes or skin necrosis: no Has patient had a PCN reaction that required hospitalization no Has patient had a PCN reaction occurring within the last 10 years: no If all of the above answers are "NO", then may proceed with Cephalosporin use.    ROS General: Denies fever, chills, night sweats, changes in weight, changes in appetite +fatigue, hot flashes HEENT: Denies headaches, ear pain, changes in vision, rhinorrhea, sore throat CV: Denies CP, palpitations, SOB, orthopnea Pulm: Denies SOB, cough, wheezing GI: Denies abdominal pain, nausea, vomiting, diarrhea, constipation GU: Denies dysuria, hematuria, frequency, vaginal discharge Msk: Denies muscle cramps, joint pains +L hip, thigh, and back pain Neuro: Denies weakness, numbness, tingling Skin: Denies rashes, bruising  Psych: Denies depression, anxiety, hallucinations     Objective:    Blood pressure 130/62, pulse 92, temperature 98 F (36.7 C), temperature source Oral, height 5' 3.25" (1.607 m), weight 185 lb (83.9 kg), SpO2 98 %.   Gen. Pleasant, well-nourished, in no distress, normal affect   HEENT: [place]/AT, face symmetric, conjunctiva clear, no scleral icterus, PERRLA, EOMI, nares patent without drainage, pharynx without erythema or exudate. Hearing aids in place. Neck: No JVD, no thyromegaly, no carotid bruits Lungs: no accessory muscle use, CTAB, no wheezes or rales Cardiovascular: RRR, no m/r/g, no peripheral edema Abdomen: BS present, soft, NT/ND, no hepatosplenomegaly. Musculoskeletal: No TTP of  cervical, thoracic, lumbar, paraspinal muscles, sciatic nerve, and thighs.  No deformities, no cyanosis or clubbing, normal tone Neuro:  A&Ox3, CN II-XII intact, normal gait Skin:  Warm, no lesions/ rash   Wt Readings from Last 3 Encounters:  10/07/22 185 lb (83.9 kg)  06/05/22 189 lb (85.7 kg)  05/04/22 186 lb 9.6 oz (84.6 kg)    Lab Results  Component Value Date   WBC 6.9 10/06/2021   HGB 11.9 (A) 04/10/2021   HCT 11 (A) 10/06/2021   PLT 163 10/06/2021   GLUCOSE 138 (H) 11/04/2017   CHOL 168 11/04/2017   TRIG 90.0 11/04/2017   HDL 55.50 11/04/2017   LDLCALC 95 11/04/2017   ALT 15 05/24/2014   AST 25 05/24/2014   NA 139 10/06/2021   K 4.6 10/06/2021   CL 104 10/06/2021   CREATININE 1.8 (A) 10/06/2021   BUN 47 (A) 10/06/2021   CO2 27 (A) 10/06/2021   TSH 2.00 01/30/2016   INR 1.62 (H) 10/30/2010   HGBA1C 7.3 (A) 12/15/2021   MICROALBUR 0.9 05/24/2014    Assessment/Plan:  Left hip pain -Discussed possible causes including arthritis, referred back pain, etc. -Discussed trying OTC Voltaren gel Tylenol arthritis strength as needed -For continued or worsening symptoms obtain imaging and follow-up with Ortho  Type 2 diabetes mellitus with stage 3b chronic kidney disease, with long-term current use of insulin (HCC) -Hemoglobin A1c 8.4% on 09/21/2022 -Continue current medications -Continue follow-up with Endo, [name] and podiatry Dr. Jacqualyn Posey  Aortic valve stenosis, etiology of cardiac valve disease unspecified -Moderate to severe AS noted on recent echo -Continue follow-up with cardiology -Continue medical management  Hot flashes -Discussed OTC treatment options including black cohosh or Estroven -Continue to monitor -Encouraged to follow-up with OB/GYN, Dr. Mancel Bale for continued or worsening symptoms  F/u in 4 months, sooner if needed  Grier Mitts, MD

## 2022-10-24 ENCOUNTER — Other Ambulatory Visit: Payer: Self-pay | Admitting: Cardiovascular Disease

## 2022-11-03 LAB — IRON,TIBC AND FERRITIN PANEL
Ferritin: 199
Iron: 50
UIBC: 285

## 2022-11-03 LAB — BASIC METABOLIC PANEL
BUN: 32 — AB (ref 4–21)
CO2: 26 — AB (ref 13–22)
Chloride: 103 (ref 99–108)
Creatinine: 1.4 — AB (ref 0.5–1.1)
Glucose: 135
Potassium: 4 mEq/L (ref 3.5–5.1)
Sodium: 139 (ref 137–147)

## 2022-11-03 LAB — COMPREHENSIVE METABOLIC PANEL
Albumin: 4.2 (ref 3.5–5.0)
Calcium: 10 (ref 8.7–10.7)
eGFR: 37

## 2022-11-03 LAB — CBC AND DIFFERENTIAL
HCT: 37 (ref 36–46)
Hemoglobin: 12.1 (ref 12.0–16.0)
Platelets: 174 10*3/uL (ref 150–400)
WBC: 8.7

## 2022-11-03 LAB — CBC: RBC: 3.84 — AB (ref 3.87–5.11)

## 2022-11-06 ENCOUNTER — Ambulatory Visit (INDEPENDENT_AMBULATORY_CARE_PROVIDER_SITE_OTHER): Payer: Medicare HMO

## 2022-11-06 VITALS — BP 120/64 | HR 68 | Temp 97.3°F | Ht 63.0 in | Wt 188.6 lb

## 2022-11-06 DIAGNOSIS — Z0000 Encounter for general adult medical examination without abnormal findings: Secondary | ICD-10-CM | POA: Diagnosis not present

## 2022-11-06 NOTE — Patient Instructions (Addendum)
Ms. Muldrow , Thank you for taking time to come for your Medicare Wellness Visit. I appreciate your ongoing commitment to your health goals. Please review the following plan we discussed and let me know if I can assist you in the future.   These are the goals we discussed:  Goals       Exercise 150 min/wk Moderate Activity      Continues exercise at the y x 3 per week       patient       Eat right; stay healthy Fat free or low fat dairy products Fish high in omega-3 acids ( salmon, tuna, trout) Fruits, such as apples, bananas, oranges, pears, prunes Legumes, such as kidney beans, lentils, checkpeas, black-eyed peas and lima beans Vegetables; broccoli, cabbage, carrots Whole grains;   Plant fats are better; decrease "white" foods as pasta, rice, bread and desserts, sugar; Avoid red meat (limiting) palm and coconut oils; sugary foods and beverages  Two nutrients that raise blood chol levels are saturated fats and trans fat; in hydrogenated oils and fats, as stick margarine, baked goods (cookes, cakes, pies, crackers; frosting; and coffee creamers;   Some Fats lower cholesterol: Monounsaturated and polyunsaturated  Avocados Corn, sunflower, and soybean oils Nuts and seeds, such as walnuts Olive, canola, peanut, safflower, and sesame oils Peanut butter Salmon and trout Tofu         Patient Stated      Lose 5 pounds by next year!  Get A1c down by monitoring portions and carbohydrate/simple sugar intake.      Stay healthy (pt-stated)      I want to keep my Diabetes under control and continue to be ADL independent.        This is a list of the screening recommended for you and due dates:  Health Maintenance  Topic Date Due   DTaP/Tdap/Td vaccine (1 - Tdap) Never done   Hemoglobin A1C  06/14/2022   Complete foot exam   08/08/2022   COVID-19 Vaccine (6 - 2023-24 season) 11/22/2022*   Flu Shot  02/21/2023*   Eye exam for diabetics  01/31/2023   Medicare Annual Wellness Visit   11/07/2023   Pneumonia Vaccine  Completed   DEXA scan (bone density measurement)  Completed   Zoster (Shingles) Vaccine  Completed   HPV Vaccine  Aged Out  *Topic was postponed. The date shown is not the original due date.    Advanced directives: In chart  Conditions/risks identified: None  Next appointment: Follow up in one year for your annual wellness visit.    Preventive Care 6 Years and Older, Female  Preventive care refers to lifestyle choices and visits with your health care provider that can promote health and wellness. What does preventive care include? A yearly physical exam. This is also called an annual well check. Dental exams once or twice a year. Routine eye exams. Ask your health care provider how often you should have your eyes checked. Personal lifestyle choices, including: Daily care of your teeth and gums. Regular physical activity. Eating a healthy diet. Avoiding tobacco and drug use. Limiting alcohol use. Practicing safe sex. Taking low doses of aspirin every day. Taking vitamin and mineral supplements as recommended by your health care provider. What happens during an annual well check? The services and screenings done by your health care provider during your annual well check will depend on your age, overall health, lifestyle risk factors, and family history of disease. Counseling  Your health care provider  may ask you questions about your: Alcohol use. Tobacco use. Drug use. Emotional well-being. Home and relationship well-being. Sexual activity. Eating habits. History of falls. Memory and ability to understand (cognition). Work and work Statistician. Screening  You may have the following tests or measurements: Height, weight, and BMI. Blood pressure. Lipid and cholesterol levels. These may be checked every 5 years, or more frequently if you are over 36 years old. Skin check. Lung cancer screening. You may have this screening every year  starting at age 72 if you have a 30-pack-year history of smoking and currently smoke or have quit within the past 15 years. Fecal occult blood test (FOBT) of the stool. You may have this test every year starting at age 51. Flexible sigmoidoscopy or colonoscopy. You may have a sigmoidoscopy every 5 years or a colonoscopy every 10 years starting at age 70. Prostate cancer screening. Recommendations will vary depending on your family history and other risks. Hepatitis C blood test. Hepatitis B blood test. Sexually transmitted disease (STD) testing. Diabetes screening. This is done by checking your blood sugar (glucose) after you have not eaten for a while (fasting). You may have this done every 1-3 years. Abdominal aortic aneurysm (AAA) screening. You may need this if you are a current or former smoker. Osteoporosis. You may be screened starting at age 21 if you are at high risk. Talk with your health care provider about your test results, treatment options, and if necessary, the need for more tests. Vaccines  Your health care provider may recommend certain vaccines, such as: Influenza vaccine. This is recommended every year. Tetanus, diphtheria, and acellular pertussis (Tdap, Td) vaccine. You may need a Td booster every 10 years. Zoster vaccine. You may need this after age 53. Pneumococcal 13-valent conjugate (PCV13) vaccine. One dose is recommended after age 42. Pneumococcal polysaccharide (PPSV23) vaccine. One dose is recommended after age 34. Talk to your health care provider about which screenings and vaccines you need and how often you need them. This information is not intended to replace advice given to you by your health care provider. Make sure you discuss any questions you have with your health care provider. Document Released: 12/06/2015 Document Revised: 07/29/2016 Document Reviewed: 09/10/2015 Elsevier Interactive Patient Education  2017 [place] Prevention in the  Home Falls can cause injuries. They can happen to people of all ages. There are many things you can do to make your home safe and to help prevent falls. What can I do on the outside of my home? Regularly fix the edges of walkways and driveways and fix any cracks. Remove anything that might make you trip as you walk through a door, such as a raised step or threshold. Trim any bushes or trees on the path to your home. Use bright outdoor lighting. Clear any walking paths of anything that might make someone trip, such as rocks or tools. Regularly check to see if handrails are loose or broken. Make sure that both sides of any steps have handrails. Any raised decks and porches should have guardrails on the edges. Have any leaves, snow, or ice cleared regularly. Use sand or salt on walking paths during winter. Clean up any spills in your garage right away. This includes oil or grease spills. What can I do in the bathroom? Use night lights. Install grab bars by the toilet and in the tub and shower. Do not use towel bars as grab bars. Use non-skid mats or decals in the tub or  shower. If you need to sit down in the shower, use a plastic, non-slip stool. Keep the floor dry. Clean up any water that spills on the floor as soon as it happens. Remove soap buildup in the tub or shower regularly. Attach bath mats securely with double-sided non-slip rug tape. Do not have throw rugs and other things on the floor that can make you trip. What can I do in the bedroom? Use night lights. Make sure that you have a light by your bed that is easy to reach. Do not use any sheets or blankets that are too big for your bed. They should not hang down onto the floor. Have a firm chair that has side arms. You can use this for support while you get dressed. Do not have throw rugs and other things on the floor that can make you trip. What can I do in the kitchen? Clean up any spills right away. Avoid walking on wet  floors. Keep items that you use a lot in easy-to-reach places. If you need to reach something above you, use a strong step stool that has a grab bar. Keep electrical cords out of the way. Do not use floor polish or wax that makes floors slippery. If you must use wax, use non-skid floor wax. Do not have throw rugs and other things on the floor that can make you trip. What can I do with my stairs? Do not leave any items on the stairs. Make sure that there are handrails on both sides of the stairs and use them. Fix handrails that are broken or loose. Make sure that handrails are as long as the stairways. Check any carpeting to make sure that it is firmly attached to the stairs. Fix any carpet that is loose or worn. Avoid having throw rugs at the top or bottom of the stairs. If you do have throw rugs, attach them to the floor with carpet tape. Make sure that you have a light switch at the top of the stairs and the bottom of the stairs. If you do not have them, ask someone to add them for you. What else can I do to help prevent falls? Wear shoes that: Do not have high heels. Have rubber bottoms. Are comfortable and fit you well. Are closed at the toe. Do not wear sandals. If you use a stepladder: Make sure that it is fully opened. Do not climb a closed stepladder. Make sure that both sides of the stepladder are locked into place. Ask someone to hold it for you, if possible. Clearly mark and make sure that you can see: Any grab bars or handrails. First and last steps. Where the edge of each step is. Use tools that help you move around (mobility aids) if they are needed. These include: Canes. Walkers. Scooters. Crutches. Turn on the lights when you go into a dark area. Replace any light bulbs as soon as they burn out. Set up your furniture so you have a clear path. Avoid moving your furniture around. If any of your floors are uneven, fix them. If there are any pets around you, be aware of  where they are. Review your medicines with your doctor. Some medicines can make you feel dizzy. This can increase your chance of falling. Ask your doctor what other things that you can do to help prevent falls. This information is not intended to replace advice given to you by your health care provider. Make sure you discuss any questions you  have with your health care provider. Document Released: 09/05/2009 Document Revised: 04/16/2016 Document Reviewed: 12/14/2014 Elsevier Interactive Patient Education  2017 [employer].

## 2022-11-06 NOTE — Progress Notes (Signed)
Subjective:   Bonnie Chavez is a 88 y.o. female who presents for Medicare Annual (Subsequent) preventive examination.  Review of Systems      Cardiac Risk Factors include: advanced age (>26mn, >>40women);diabetes mellitus;hypertension     Objective:    Today's Vitals   11/06/22 0817  BP: 120/64  Pulse: 68  Temp: (!) 97.3 F (36.3 C)  TempSrc: Oral  SpO2: 98%  Weight: 188 lb 9.6 oz (85.5 kg)  Height: '5\' 3"'$  (1.6 m)   Body mass index is 33.41 kg/m.     11/06/2022    8:41 AM 11/05/2021    8:32 AM 11/04/2020    9:13 AM 01/05/2019    3:41 PM 11/05/2017    1:01 PM 05/25/2017    8:46 AM 10/29/2016   10:28 AM  Advanced Directives  Does Patient Have a Medical Advance Directive? Yes Yes Yes Yes Yes Yes Yes  Type of AParamed[place]of [place] MountainLiving will [place]Living will [place]Living will HGrand RidgeLiving will  [place]Living will   Does patient want to make changes to medical advance directive? No - Patient declined No - Patient declined No - Patient declined   No - Patient declined   Copy of [place] Chart? Yes - validated most recent copy scanned in chart (See row information) No - copy requested Yes - validated most recent copy scanned in chart (See row information) Yes - validated most recent copy scanned in chart (See row information)  No - copy requested     Current Medications (verified) Outpatient Encounter Medications as of 11/06/2022  Medication Sig   acetaminophen (TYLENOL) 500 MG tablet Take 1 tablet (500 mg total) by mouth every 4 (four) hours as needed for moderate pain.   amLODipine (NORVASC) 5 MG tablet Take 1 tablet (5 mg total) by mouth daily.   aspirin 81 MG EC tablet Take 81 mg by mouth at bedtime. Swallow whole.   BD PEN NEEDLE NANO 2ND GEN 32G X 4 MM MISC USE AS DIRECTED 90   cephALEXin (KEFLEX) 500 MG capsule Take 500-1,000 mg  by mouth See admin instructions. TAKE ONLY FOR INVASIVE DENTAL PROCEDURES   ciclopirox (PENLAC) 8 % solution Apply topically at bedtime. Apply over nail and surrounding skin. Apply daily over previous coat. After seven (7) days, may remove with alcohol and continue cycle.   cinacalcet (SENSIPAR) 30 MG tablet    furosemide (LASIX) 20 MG tablet Take 40 mg by mouth daily. Can take additional tablet if needed   gabapentin (NEURONTIN) 100 MG capsule Take 100 mg by mouth at bedtime.   HUMULIN 70/30 KWIKPEN (70-30) 100 UNIT/ML KwikPen Inject 10 Units into the skin every evening.   levothyroxine (SYNTHROID) 50 MCG tablet Take 50 mcg by mouth daily before breakfast.   nebivolol (BYSTOLIC) 10 MG tablet Take 0.5 tablets (5 mg total) by mouth 2 (two) times daily.   OneTouch Delica Lancets 320NMISC Apply topically.   ONETOUCH VERIO test strip 1 each 2 (two) times daily.   rosuvastatin (CRESTOR) 40 MG tablet TAKE 1 TABLET BY MOUTH EVERYDAY AT BEDTIME   No facility-administered encounter medications on file as of 11/06/2022.    Allergies (verified) Contrast media [iodinated contrast media], Lisinopril, Nsaids, Sensipar [cinacalcet hcl], and Penicillins   History: Past Medical History:  Diagnosis Date   Anemia    History of , resolved   Aortic valve sclerosis    Carpal  tunnel syndrome    Chicken pox    Chronic low back pain 02/27/2016   -DDD -seeing [facility]    Diabetes mellitus    type 2, sees Dr. Chalmers Cater   Goiter    Dr. Chalmers Cater   Heart murmur    History of nuclear stress test 06/2010   dipyridamole; normal pattern of perfusion; ekg negative for ischemia; low risk scan    Hot flashes    Hyperlipidemia    Hyperparathyroidism ([place]) 02/27/2016   -sees endocrinologist, Dr. Chalmers Cater, has seen surgeon as well    Hypertension    Migraine    no longer has migraines (80's)   Mitral valve prolapse    Neuropathy    Obesity 02/27/2016   Osteoarthritis    DDD, sees [facility]   PMB (postmenopausal  bleeding) 02/28/2009   Resolved   Pneumonia    history of    Renal insufficiency    [name]    Vaginal atrophy    Vitamin D deficiency    Past Surgical History:  Procedure Laterality Date   CESAREAN SECTION  1972 &1976   [place] N/A 05/15/2013   Procedure: [place];  Surgeon: Eldred Manges, MD;  Location: [facility];  Service: Gynecology;  Laterality: N/A;   DILATATION & CURRETTAGE/HYSTEROSCOPY WITH RESECTOCOPE N/A 06/03/2017   Procedure: DILATATION & CURETTAGE/HYSTEROSCOPY;  Surgeon: Eldred Manges, MD;  Location: [facility];  Service: Gynecology;  Laterality: N/A;   [place]   KNEE SURGERY  2000 2011   right knee replacement    REPLACEMENT TOTAL KNEE  2000   left   SHOULDER SURGERY  2004 2010    TRANSTHORACIC ECHOCARDIOGRAM  07/2010   EF=>55%; LA mildly dilated; trace MR/TR;   TUBAL LIGATION  1976   Family History  Problem Relation Age of Onset   Hypertension Mother    Diabetes Mother    Hyperlipidemia Mother    Heart attack Mother    Heart disease Father    Heart attack Father    Stroke Brother    Heart disease Brother    Diabetes Brother    Uterine cancer Maternal Grandmother    Social History   Socioeconomic History   Marital status: Divorced    Spouse name: Not on file   Number of children: 2   Years of education: Not on file   Highest education level: Not on file  Occupational History   Not on file  Tobacco Use   Smoking status: Never   Smokeless tobacco: Never  Substance and Sexual Activity   Alcohol use: No    Alcohol/week: 0.0 standard drinks of alcohol   Drug use: No   Sexual activity: Not on file  Other Topics Concern   Not on file  Social History Narrative   Work or School: none      Home Situation: lives alone, feels safe, son lives nearby       Spiritual Beliefs: Christian      Lifestyle: Y 3x per week, healthy diet       01/05/2019: Lives alone in one level house.   Goes to [employer] to do water aerobics 3X/week   Has son and daughter, son local      Social Determinants of Health   Financial Resource Strain: Low Risk  (11/06/2022)   Overall Financial Resource  Strain (CARDIA)    Difficulty of Paying Living Expenses: Not hard at all  Food Insecurity: No Food Insecurity (11/06/2022)   Hunger Vital Sign    Worried About Running Out of Food in the Last Year: Never true    Ran Out of Food in the Last Year: Never true  Transportation Needs: No Transportation Needs (11/06/2022)   PRAPARE - Hydrologist (Medical): No    Lack of Transportation (Non-Medical): No  Physical Activity: Insufficiently Active (11/06/2022)   Exercise Vital Sign    Days of Exercise per Week: 2 days    Minutes of Exercise per Session: 40 min  Stress: No Stress Concern Present (11/06/2022)   [place]    Feeling of Stress : Not at all  Social Connections: Moderately Integrated (11/06/2022)   Social Connection and Isolation Panel [NHANES]    Frequency of Communication with Friends and Family: More than three times a week    Frequency of Social Gatherings with Friends and Family: More than three times a week    Attends Religious Services: More than 4 times per year    Active Member of [employer] or Organizations: Yes    Attends Music therapist: More than 4 times per year    Marital Status: Divorced    Tobacco Counseling Counseling given: Not Answered   Clinical Intake:  Pre-visit preparation completed: No  Pain : No/denies pain   Nutrition Risk Assessment:  Has the patient had any N/V/D within the last 2 months?  No  Does the patient have any non-healing wounds?  No  Has the patient had any unintentional weight loss or weight gain?  No    Diabetes:  Is the patient diabetic?  Yes  If diabetic, was a CBG obtained today?  Yes CBG 114 Taken by patient Did the patient bring in their glucometer from home?  No  How often do you monitor your CBG's? 3x daily.   Financial Strains and Diabetes Management:  Are you having any financial strains with the device, your supplies or your medication? No .  Does the patient want to be seen by Chronic Care Management for management of their diabetes?  No  Would the patient like to be referred to a Nutritionist or for Diabetic Management?  No   Diabetic Exams:  Diabetic Eye Exam: Completed No. Overdue for diabetic eye exam. Pt has been advised about the importance in completing this exam. A referral has been placed today. Message sent to referral coordinator for scheduling purposes. Advised pt to expect a call from office referred to regarding appt.  Diabetic Foot Exam: Completed No. Pt has been advised about the importance in completing this exam. Pt is scheduled for diabetic foot exam on Followed by PCP.    BMI - recorded: 33.41 Nutritional Status: BMI > 30  Obese Nutritional Risks: None Diabetes: Yes CBG done?: Yes CBG resulted in Enter/ Edit results?: Yes (CBG 114 Taken by patient) Did pt. bring in CBG monitor from home?: No  How often do you need to have someone help you when you read instructions, pamphlets, or other written materials from your doctor or pharmacy?: 1 - Never  Diabetic?  Yes  Interpreter Needed?: No  Information entered by :: Rolene Arbour LPN   Activities of Daily Living    11/06/2022    8:38 AM  In your present state of health, do you have any difficulty performing the following  activities:  Hearing? 1  Comment Wears hearing aid  Vision? 0  Difficulty concentrating or making decisions? 0  Walking or climbing stairs? 0  Dressing or bathing? 0  Doing errands, shopping? 0  Preparing Food and eating ? N  Using the Toilet? N  In the past six months,  have you accidently leaked urine? N  Do you have problems with loss of bowel control? N  Managing your Medications? N  Managing your Finances? N  Housekeeping or managing your Housekeeping? N    Patient Care Team: Billie Ruddy, MD as PCP - General (Family Medicine) Troy Sine, MD as PCP - Cardiology (Cardiology) Jacelyn Pi, MD as Consulting Physician (Endocrinology) Deterding, Jeneen Rinks, MD as Consulting Physician (Nephrology) Leo Grosser, Seymour Bars, MD (Inactive) as Consulting Physician (Obstetrics and Gynecology) Sharyne Peach, MD as Consulting Physician (Ophthalmology) Gaynelle Arabian, MD as Consulting Physician (Orthopedic Surgery) Rosita Fire, MD as Consulting Physician (Nephrology)  Indicate any recent Medical Services you may have received from other than [facility] providers in the past year (date may be approximate).     Assessment:   This is a routine wellness examination for [place].  Hearing/Vision screen Hearing Screening - Comments:: Wears hearing aids Vision Screening - Comments:: Wears rx glasses - up to date with routine eye exams with  Dr Delman Cheadle  Dietary issues and exercise activities discussed: Current Exercise Habits: Home exercise routine, Type of exercise: walking, Time (Minutes): 40, Frequency (Times/Week): 2, Weekly Exercise (Minutes/Week): 80, Intensity: Moderate, Exercise limited by: None identified   Goals Addressed               This Visit's Progress     Stay healthy (pt-stated)        I want to keep my Diabetes under control and continue to be ADL independent.       Depression Screen    10/07/2022    8:58 AM 12/15/2021    9:24 AM 11/05/2021    8:23 AM 11/04/2020    9:16 AM 05/01/2020    8:59 AM 01/05/2019    3:42 PM 11/05/2017    1:04 PM  PHQ 2/9 Scores  PHQ - 2 Score 0 0 0 0 0 0 0  PHQ- 9 Score    0  0     Fall Risk    11/06/2022    8:38 AM 10/07/2022    8:58 AM 12/15/2021    9:24 AM 11/05/2021    8:27 AM 11/04/2020     9:14 AM  Fall Risk   Falls in the past year? 0 0 0 0 0  Number falls in past yr: 0 0  0 0  Injury with Fall? 0 0  0 0  Risk for fall due to : No Fall Risks No Fall Risks   No Fall Risks  Follow up Falls prevention discussed Falls evaluation completed   Falls prevention discussed;Falls evaluation completed    FALL RISK PREVENTION PERTAINING TO THE HOME:  Any stairs in or around the home? No  If so, are there any without handrails? No  Home free of loose throw rugs in walkways, pet beds, electrical cords, etc? Yes  Adequate lighting in your home to reduce risk of falls? Yes   ASSISTIVE DEVICES UTILIZED TO PREVENT FALLS:  Life alert? No  Use of a cane, walker or w/c? No  Grab bars in the bathroom? No  Shower chair or bench in shower? Yes  Elevated toilet seat or a handicapped toilet? Yes  TIMED UP AND GO:  Was the test performed? Yes .  Length of time to ambulate 10 feet: 10 sec.   Gait slow and steady without use of assistive device  Cognitive Function:        11/06/2022    8:42 AM 11/05/2021    8:30 AM 11/04/2020    9:21 AM  6CIT Screen  What Year? 0 points 0 points 0 points  What month? 0 points 0 points 0 points  What time? 0 points 0 points 0 points  Count back from 20 0 points 0 points 0 points  Months in reverse 0 points 0 points 0 points  Repeat phrase 0 points 0 points 2 points  Total Score 0 points 0 points 2 points    Immunizations Immunization History  Administered Date(s) Administered   Fluad Quad(high Dose 65+) 08/08/2019, 07/23/2020, 08/08/2021   Influenza Whole 08/23/2012   Influenza, High Dose Seasonal PF 08/25/2016, 08/07/2018   Influenza,inj,quad, With Preservative 08/24/2015, 08/19/2017   Influenza-Unspecified 08/26/2015, 08/25/2016, 08/19/2017   PFIZER(Purple Top)SARS-COV-2 Vaccination 02/05/2020, 02/26/2020, 11/05/2020   Pneumococcal Conjugate-13 02/28/2015   Pneumococcal Polysaccharide-23 07/25/2011   Unspecified SARS-COV-2 Vaccination  02/05/2020, 02/26/2020   Zoster Recombinat (Shingrix) 08/07/2018, 01/11/2019    TDAP status: Due, Education has been provided regarding the importance of this vaccine. Advised may receive this vaccine at local pharmacy or Health Dept. Aware to provide a copy of the vaccination record if obtained from local pharmacy or Health Dept. Verbalized acceptance and understanding.  Flu Vaccine status: Up to date  Pneumococcal vaccine status: Up to date  Covid-19 vaccine status: Completed vaccines  Qualifies for Shingles Vaccine? Yes   Zostavax completed Yes   Shingrix Completed?: Yes  Screening Tests Health Maintenance  Topic Date Due   DTaP/Tdap/Td (1 - Tdap) Never done   HEMOGLOBIN A1C  06/14/2022   FOOT EXAM  08/08/2022   COVID-19 Vaccine (6 - 2023-24 season) 11/22/2022 (Originally 07/24/2022)   INFLUENZA VACCINE  02/21/2023 (Originally 06/23/2022)   OPHTHALMOLOGY EXAM  01/31/2023   Medicare Annual Wellness (AWV)  11/07/2023   Pneumonia Vaccine 72+ Years old  Completed   DEXA SCAN  Completed   Zoster Vaccines- Shingrix  Completed   HPV VACCINES  Aged Out    Health Maintenance  Health Maintenance Due  Topic Date Due   DTaP/Tdap/Td (1 - Tdap) Never done   HEMOGLOBIN A1C  06/14/2022   FOOT EXAM  08/08/2022    Colorectal cancer screening: No longer required.   Mammogram status: No longer required due to Age.  Bone Density status: Completed 07/03/21. Results reflect: Bone density results: OSTEOPOROSIS. Repeat every   years.  Lung Cancer Screening: (Low Dose CT Chest recommended if Age 59-80 years, 30 pack-year currently smoking OR have quit w/in 15years.) does not qualify.    Additional Screening:  Hepatitis C Screening: does not qualify; Completed   Vision Screening: Recommended annual ophthalmology exams for early detection of glaucoma and other disorders of the eye. Is the patient up to date with their annual eye exam?  Yes  Who is the provider or what is the name of the  office in which the patient attends annual eye exams? Dr Delman Cheadle If pt is not established with a provider, would they like to be referred to a provider to establish care? No .   Dental Screening: Recommended annual dental exams for proper oral hygiene  Community Resource Referral / Chronic Care Management:  CRR required this visit?  No   CCM required this  visit?  No      Plan:     I have personally reviewed and noted the following in the patient's chart:   Medical and social history Use of alcohol, tobacco or illicit drugs  Current medications and supplements including opioid prescriptions. Patient is not currently taking opioid prescriptions. Functional ability and status Nutritional status Physical activity Advanced directives List of other physicians Hospitalizations, surgeries, and ER visits in previous 12 months Vitals Screenings to include cognitive, depression, and falls Referrals and appointments  In addition, I have reviewed and discussed with patient certain preventive protocols, quality metrics, and best practice recommendations. A written personalized care plan for preventive services as well as general preventive health recommendations were provided to patient.     Criselda Peaches, LPN   09/22/2810   Nurse Notes:  Patient due Hemoglobin A1c

## 2022-11-24 NOTE — Progress Notes (Signed)
Patient ID: Bonnie Chavez, female   DOB: Apr 01, 1934, 89 y.o.   MRN: [id number]       HPI: Bonnie Chavez is a 89 y.o. female presents to the office today for a 12 month cardiology evaluation.  Bonnie Chavez has a history of hypertension with documented grade 1 diastolic dysfunction, hyperlipidemia, type 2 diabetes mellitus with renal insufficiency, as well as aortic valve sclerosis without stenosis. An echo Doppler study in 2011 showed an EF of 55%.  In 2011 a nuclear perfusion scan showed normal perfusion and function.  She has a history of a goiter for which she sees [name], [name] for her renal insufficiency, [name] for uterine polyps and fibroids.  She has a history of hyperparathyroidism and saw [name]; she has not had surgery in his undergoing continued surveillance of her calcium levels.  She underwent a five-year follow-up echo Doppler study on 08/23/2015.  This continued to show normal systolic function with mild LVH and grade 1 diastolic dysfunction.  There was a 10 mm mean gradient and 22 mm peak gradient across her moderately calcified aortic valve with a valve area of 1.63 cm.    She has a long-standing history of diabetes mellitus  and sees [name].  She sees [name] for renal insufficiency.  I reviewed her recent blood work from his office 03/09/2017 which showed her creatinine had improved to 1.5.  In March 2018 Cr was 1.63 and in December 2017 Cr was 1.83.    When I saw her in May 2018, she had had subsequent blood work which had revealed improvement in creatinine to 1.5.  I recommended she undergo a 2 year follow-up echo Doppler evaluation of her aortic stenosis.  This was done on 08/10/2007 and showed an EF of 65-70%.  Her mean aortic gradient has increased to 16 and her peak gradient 30.  There was grade 1 diastolic dysfunction, mild MR, mild LA dilation and mild TR.  Her estimated aortic valve area is 1.58 cm.  She denied any chest pain, presyncope or  syncope.    I saw her in October 2018.  She has continued to be followed by [name] of endocrinology and [name] of nephrology.  She has renal insufficiency and her creatinine in August 2018 was 1.7.  Her blood pressure was stable and I recommended reduction of her furosemide to 20 mg from her prior dose of 40 mg.  Any chest pain.  She denies presyncope or syncope.  She tells me her recent hemoglobin A1c was 7.6.  She is  followed by [name] of endocrinology and [name] of nephrology. A repeat creatinine in August 2018 was 1.7.    I saw her in the office for evaluation in November 2019.  At that time she denied any chest pain, shortness of breath, presyncope or syncope.  She underwent a repeat echo Doppler study on September 02, 2018 which continued to show normal systolic function with an EF of 55 to 96%, grade 1 diastolic dysfunction, moderate aortic stenosis with a mean gradient of 23 and peak gradient of 37 and mitral annular calcification.  Laboratory from [name] in October 2019 revealed a creatinine of 1.64.    She had a telemedicine evaluation with [name], her primary physician in March 2020 and saw [name]  and had repeat renal function testing.    I evaluated her on May 12, 2019 in a telemedicine visit. She had remained asymptomatic and specifically denied  any chest pain PND orthopnea, presyncope or syncope.  She denies any palpitations.  At times she notes trivial left ankle edema.    She underwent an echo Doppler study on November 06, 2019 which continued to show an EF of 60 to 65%.  There were no regional wall motion abnormalities.  There was moderate LVH and asymmetric left ventricular hypertrophy.  There was moderate aortic stenosis with a mean gradient of 19 and peak gradient of 36.2.  There was mild PR, trivial MR and TR. I last saw her on November 29, 2019.  At that time she felt well and admitted to just mild episodes of shortness of breath particularly  with walking uphill.  She has had issues with arthritis.  She has continued to see [name] for endocrinologic care and now sees [name] for nephrology since Dr. [name] is about to retire.   I saw her in August 2021.  She underwent laboratory on June 24, 2020 by [name] which showed a creatinine of 1.42.  She had normal LFTs.  TSH was 1.34.  Total cholesterol 160, triglycerides 74, HDL 48, LDL cholesterol 98.  Laboratory by [name] has shown mild vitamin D insufficiency with a vitamin D level of 23 in April 2021.  Creatinine at that time was further increased at 1.74 which subsequently improved.  She denies any chest tightness.  She denied any dizziness presyncope or syncope.  She continued to experience some mild shortness of breath with uphill walking with this without change.  During that evaluation her blood pressure was stable on amlodipine 5 mg, Bystolic 5 mg twice a day, furosemide 40 mg, and lisinopril 10 mg.  She continued to be on rosuvastatin 40 mg for hyperlipidemia.  She was being followed by [name] for her stage III chronic kidney disease.  I recommended that she undergo a follow-up echo Doppler study for reassessment of her aortic valve prior to her next cardiac evaluation.  On February 21, 2021, an echo Doppler study showed normal LV function with EF 60 to 65%, moderate left ventricular hypertrophy and grade 2 diastolic dysfunction.  She had elevated left atrial pressure.  She was felt to have at least moderate least severe aortic stenosis.  Her mean gradient was 24 mm, peak gradient 40.8 mm and aortic valve area at 0.9 cm.  I  saw her on Mar 25, 2021 at which time she remained stable but had experienced some mild shortness of breath with uphill walking.  She typically walks with a cane.  She is unaware of any presyncope or syncope.  She denies any leg edema.  She had recently seen [name] of nephrology and laboratory was obtained in January 2022.  Creatinine was  1.51 with her estimated GFR at 36 consistent with stage IIIb CKD.    She underwent her wellness visit on November 05, 2021 with her primary provider.  She had a follow-up echo Doppler study in November 25, 2020 which showed an EF of 60 to 65% with mild concentric LVH and grade 1 diastolic dysfunction.  Her aortic valve was trileaflet with severe calcification.  There was moderate aortic stenosis with a mean gradient at 20 and peak gradient of 35.6 mmHg.  Aortic valve area was 1.6 cm.    I last saw her on December 04, 2021.  At that time she continued to deny any chest pain.  She walks with a cane.   [facility].  She has CKD and continues to see Dr. Rebeca Alert a and her creatinine in November 2022 was 1.8.  She denies chest pain, presyncope or syncope, or any significant dyspnea.  At that time she was asymptomatic with reference to her aortic stenosis.  I recommended that in October 2023 she have a follow-up evaluation for reassessment.  Since I last saw her, she underwent a 2D echo Doppler study on September 03, 2022.  This showed normal LV function with EF 60 to 65%.  There was grade 1 diastolic dysfunction.  RV pressure was normal.  She was felt to have moderately severe AAS and her mean gradient had increased from 20 mg to 24 mg with her peak gradient now at 36 mm.  Presently, Ms. Hitt remains asymptomatic.  She denies chest pain presyncope or syncope.  At times she admits to mild shortness of breath with exertion.  [facility] per week.  She has also seen [name] with creatinine 1.4 which has been fairly stable.  She is unaware of palpitations.  She presents for reevaluation.   Past Medical History:  Diagnosis Date   Anemia    History of , resolved   Aortic valve sclerosis    Carpal tunnel syndrome    Chicken pox    Chronic low back pain 02/27/2016   -DDD -seeing [facility]    Diabetes mellitus     type 2, sees Dr. Chalmers Cater   Goiter    Dr. Chalmers Cater   Heart murmur    History of nuclear stress test 06/2010   dipyridamole; normal pattern of perfusion; ekg negative for ischemia; low risk scan    Hot flashes    Hyperlipidemia    Hyperparathyroidism ([place]) 02/27/2016   -sees endocrinologist, Dr. Chalmers Cater, has seen surgeon as well    Hypertension    Migraine    no longer has migraines (80's)   Mitral valve prolapse    Neuropathy    Obesity 02/27/2016   Osteoarthritis    DDD, sees [facility]   PMB (postmenopausal bleeding) 02/28/2009   Resolved   Pneumonia    history of    Renal insufficiency    [name]    Vaginal atrophy    Vitamin D deficiency     Past Surgical History:  Procedure Laterality Date   CESAREAN SECTION  1972 &1976   [place] N/A 05/15/2013   Procedure: [place];  Surgeon: Eldred Manges, MD;  Location: [facility];  Service: Gynecology;  Laterality: N/A;   DILATATION & CURRETTAGE/HYSTEROSCOPY WITH RESECTOCOPE N/A 06/03/2017   Procedure: DILATATION & CURETTAGE/HYSTEROSCOPY;  Surgeon: Eldred Manges, MD;  Location: [facility];  Service: Gynecology;  Laterality: N/A;   [place]   KNEE SURGERY  2000 2011   right knee replacement    REPLACEMENT TOTAL KNEE  2000   left   SHOULDER SURGERY  2004 2010    TRANSTHORACIC ECHOCARDIOGRAM  07/2010   EF=>55%; LA mildly dilated; trace MR/TR;   TUBAL LIGATION  1976    Allergies  Allergen Reactions   Contrast Media [Iodinated Contrast Media] Other (See Comments)    Reaction unknown   Lisinopril Hives    Cough, hives, and lip edema    Nsaids Other (See Comments)    Abnormal renal function  Sensipar [Cinacalcet Hcl]     Pt stated, "Made me feel bloated, gassy and fluid buildup"   Penicillins Rash and Other (See Comments)    Pt has taken Keflex  without difficulty Has patient had a PCN reaction causing immediate rash, facial/tongue/throat swelling, SOB or lightheadedness with hypotension: yes Has patient had a PCN reaction causing severe rash involving mucus membranes or skin necrosis: no Has patient had a PCN reaction that required hospitalization no Has patient had a PCN reaction occurring within the last 10 years: no If all of the above answers are "NO", then may proceed with Cephalosporin use.    Current Outpatient Medications  Medication Sig Dispense Refill   acetaminophen (TYLENOL) 500 MG tablet Take 1 tablet (500 mg total) by mouth every 4 (four) hours as needed for moderate pain. 60 tablet 0   amLODipine (NORVASC) 5 MG tablet Take 1 tablet (5 mg total) by mouth daily. 30 tablet 1   aspirin 81 MG EC tablet Take 81 mg by mouth at bedtime. Swallow whole.     BD PEN NEEDLE NANO 2ND GEN 32G X 4 MM MISC USE AS DIRECTED 90     cephALEXin (KEFLEX) 500 MG capsule Take 500-1,000 mg by mouth See admin instructions. TAKE ONLY FOR INVASIVE DENTAL PROCEDURES     ciclopirox (PENLAC) 8 % solution Apply topically at bedtime. Apply over nail and surrounding skin. Apply daily over previous coat. After seven (7) days, may remove with alcohol and continue cycle. 6.6 mL 2   cinacalcet (SENSIPAR) 30 MG tablet      furosemide (LASIX) 20 MG tablet Take 40 mg by mouth daily. Can take additional tablet if needed 30 tablet    gabapentin (NEURONTIN) 100 MG capsule Take 100 mg by mouth at bedtime.     HUMULIN 70/30 KWIKPEN (70-30) 100 UNIT/ML KwikPen Inject 10 Units into the skin every evening. 4 units in the AM     levothyroxine (SYNTHROID) 50 MCG tablet Take 50 mcg by mouth daily before breakfast.     OneTouch Delica Lancets 40X MISC Apply topically.     ONETOUCH VERIO test strip 1 each 2 (two) times daily.     rosuvastatin (CRESTOR) 40 MG tablet TAKE 1 TABLET BY MOUTH EVERYDAY AT BEDTIME 90 tablet 1   nebivolol (BYSTOLIC) 10 MG tablet Take 1 tablet (10  mg total) by mouth every morning AND 0.5 tablets (5 mg total) every evening. 135 tablet 3   No current facility-administered medications for this visit.    Social History   Socioeconomic History   Marital status: Divorced    Spouse name: Not on file   Number of children: 2   Years of education: Not on file   Highest education level: Not on file  Occupational History   Not on file  Tobacco Use   Smoking status: Never   Smokeless tobacco: Never  Substance and Sexual Activity   Alcohol use: No    Alcohol/week: 0.0 standard drinks of alcohol   Drug use: No   Sexual activity: Not on file  Other Topics Concern   Not on file  Social History Narrative   Work or School: none      Home Situation: lives alone, feels safe, son lives nearby      Spiritual Beliefs: Christian      Lifestyle: Y 3x per week, healthy diet       01/05/2019: Lives alone in one level house.   Goes to [employer] to do water aerobics 3X/week  Has son and daughter, son local      Social Determinants of Health   Financial Resource Strain: Low Risk  (11/06/2022)   Overall Financial Resource Strain (CARDIA)    Difficulty of Paying Living Expenses: Not hard at all  Food Insecurity: No Food Insecurity (11/06/2022)   Hunger Vital Sign    Worried About Running Out of Food in the Last Year: Never true    Ran Out of Food in the Last Year: Never true  Transportation Needs: No Transportation Needs (11/06/2022)   PRAPARE - Hydrologist (Medical): No    Lack of Transportation (Non-Medical): No  Physical Activity: Insufficiently Active (11/06/2022)   Exercise Vital Sign    Days of Exercise per Week: 2 days    Minutes of Exercise per Session: 40 min  Stress: No Stress Concern Present (11/06/2022)   [place]    Feeling of Stress : Not at all  Social Connections: Moderately Integrated (11/06/2022)   Social Connection and  Isolation Panel [NHANES]    Frequency of Communication with Friends and Family: More than three times a week    Frequency of Social Gatherings with Friends and Family: More than three times a week    Attends Religious Services: More than 4 times per year    Active Member of [employer] or Organizations: Yes    Attends Archivist Meetings: More than 4 times per year    Marital Status: Divorced  Intimate Partner Violence: Not At Risk (11/06/2022)   Humiliation, Afraid, Rape, and Kick questionnaire    Fear of Current or Ex-Partner: No    Emotionally Abused: No    Physically Abused: No    Sexually Abused: No   Social history is notable that she's divorced has 2 children one grandchild. She does exercise. There is no tobacco or alcohol use..  Family History  Problem Relation Age of Onset   Hypertension Mother    Diabetes Mother    Hyperlipidemia Mother    Heart attack Mother    Heart disease Father    Heart attack Father    Stroke Brother    Heart disease Brother    Diabetes Brother    Uterine cancer Maternal Grandmother     ROS General: Negative; No fevers, chills, or night sweats; No significant change in weight HEENT: Negative; No changes in vision or hearing, sinus congestion, difficulty swallowing Pulmonary: Negative; No cough, wheezing, shortness of breath, hemoptysis Cardiovascular: See history of present illness GI: Negative; No nausea, vomiting, diarrhea, or abdominal pain GU: Negative; No dysuria, hematuria, or difficulty voiding Musculoskeletal: Negative; no myalgias, joint pain, or weakness Hematologic/Oncology: Negative; no easy bruising, bleeding Endocrine: Positive for diabetes mellitus, positive for hyperparathyroidism Neuro: Negative; no changes in balance, headaches Skin: Negative; No rashes or skin lesions Psychiatric: Negative; No behavioral problems, depression Sleep: Negative; No snoring, daytime sleepiness, hypersomnolence, bruxism, restless legs,  hypnogognic hallucinations, no cataplexy Other comprehensive 14 point system review is negative.   PE BP 138/68 (BP Location: Left Arm, Patient Position: Sitting, Cuff Size: Normal)   Pulse 81   Wt 188 lb 3.2 oz (85.4 kg)   SpO2 96%   BMI 33.34 kg/m    Repeat blood pressure by me was 128/70  Wt Readings from Last 3 Encounters:  11/30/22 188 lb 3.2 oz (85.4 kg)  11/06/22 188 lb 9.6 oz (85.5 kg)  10/07/22 185 lb (83.9 kg)   General: Alert, oriented, no  distress.  Skin: normal turgor, no rashes, warm and dry HEENT: Normocephalic, atraumatic. Pupils equal round and reactive to light; sclera anicteric; extraocular muscles intact; Nose without nasal septal hypertrophy Mouth/Parynx benign; Mallinpatti scale 3 Neck: No JVD, no carotid bruits; normal carotid upstroke Lungs: clear to ausculatation and percussion; no wheezing or rales Chest wall: without tenderness to palpitation Heart: PMI not displaced, irregular, s1 s2 normal, 834 systolic murmurin aortic area c/w AS, no diastolic murmur, no rubs, gallops, thrills, or heaves Abdomen: soft, nontender; no hepatosplenomehaly, BS+; abdominal aorta nontender and not dilated by palpation. Back: no CVA tenderness Pulses 2+ Musculoskeletal: full range of motion, normal strength, no joint deformities Extremities: no clubbing cyanosis or edema, Homan's sign negative  Neurologic: grossly nonfocal; Cranial nerves grossly wnl Psychologic: Normal mood and affect   January 8,2024 ECG (independently read by me): Sinus rhythm at 81, PACs, PVC, 1st degree AV block  December 04, 2021 ECG (independently read by me): Sinus rhythm at 65, 1st degree AV block; PR 280 msec, PACs, LVH  August 2021 ECG (independently read by me): Normal sinus rhythm at 60 bpm.  First-degree AV block with a PR interval at 258 ms.  LVH by voltage in aVL.  No ectopy.  January 2021 ECG (independently read by me): Normal sinus rhythm at 61 bpm with first-degree AV block; PR  interval 258 ms.  LVH by voltage criteria in aVL.    November 2019 ECG (independently read by me): Normal sinus rhythm at 61 bpm.  First-degree AV block.  LVH by voltage criteria.  October 2018 ECG (independently read by me): Sinus bradycardia 57 bpm with first-degree AV block.  LVH by voltage criteria.  PR interval 270 ms.  May 2018 ECG (independently read by me): sinus rhythm with first-degree AV block.  LVH by voltage criteria.  PR interval 250 ms.  April 2017 ECG (independently read by me): Normal sinus rhythm at 61 bpm with first-degree AV block with a PR interval of 244 ms.  LVH by voltage.  September 2016 ECG (independently read by me): Normal sinus rhythm at 60 bpm.  First degree block with a PR interval at 252 ms.  No significant ST-T changes.  October 2015 ECG (independently read by me): Sinus bradycardia at 57 beats per minute.  First degree AV block with a PR interval of 236 ms   September 2014 ECG: Sinus rhythm at 60 beats per minute. First grade AV block with PR interval of 244 ms. QTC interval 356 ms  LABS:    Latest Ref Rng & Units 10/06/2021   12:00 AM 04/10/2021   12:00 AM 01/11/2018   12:00 AM  BMP  BUN 4 - 21 47     36     43      Creatinine 0.5 - 1.1 1.8     1.4     1.7      Sodium 137 - 147 139     140     142      Potassium 3.4 - 5.3 4.6     4.1     4.8      Chloride 99 - 108 104     105       CO2 13 - _0 Calcium 8.7 - 10.7 10.4     10.2          This result is from an external source.      Latest  Ref Rng & Units 10/06/2021   12:00 AM 04/10/2021   12:00 AM 05/24/2014   10:14 AM  Hepatic Function  Total Protein 6.0 - 8.3 g/dL   8.0   Albumin 3.5 - 5.0 4.1     4.1     4.0   AST 0 - 37 U/L   25   ALT 0 - 35 U/L   15   Alk Phosphatase 39 - 117 U/L   44   Total Bilirubin 0.2 - 1.2 mg/dL   0.6   Bilirubin, Direct 0.0 - 0.3 mg/dL   0.1      This result is from an external source.      Latest Ref Rng & Units 10/06/2021   12:00 AM 04/10/2021    12:00 AM 12/27/2019    9:55 AM  CBC  WBC  6.9      7.8   Hemoglobin 12.0 - 16.0  11.9     12.2   Hematocrit 36 - 46 11     36     37.0   Platelets 150 - 399 163      200.0      This result is from an external source.   Lab Results  Component Value Date   MCV 96.1 12/27/2019   MCV 98.8 11/04/2017   MCV 98.1 05/25/2017   Lab Results  Component Value Date   TSH 2.00 01/30/2016   Lab Results  Component Value Date   HGBA1C 7.3 (A) 12/15/2021   Lab Results  Component Value Date   CALCIUM 10.4 10/06/2021   Lipid Panel     Component Value Date/Time   CHOL 168 11/04/2017 0819   TRIG 90.0 11/04/2017 0819   HDL 55.50 11/04/2017 0819   CHOLHDL 3 11/04/2017 0819   VLDL 18.0 11/04/2017 0819   LDLCALC 95 11/04/2017 0819    RADIOLOGY:   ECHO: 09/03/2022 IMPRESSIONS   1. The aortic valve is tricuspid. There is severe calcifcation of the  aortic valve. There is severe thickening of the aortic valve. Aortic valve  regurgitation is not visualized. Moderate to severe aortic valve stenosis.  Aortic valve mean gradient  measures 24.0 mmHg. Aortic valve Vmax measures 3.00 m/s. AVA by continuity  is 0.9cm2, DI 0.26, SVi 35.2   2. Left ventricular ejection fraction, by estimation, is 60 to 65%. The  left ventricle has normal function. The left ventricle has no regional  wall motion abnormalities. There is moderate basal-septal hypertrophy. The  rest of the LV segments demonstrate   mild left ventricular hypertrophy. Left ventricular diastolic parameters  are consistent with Grade I diastolic dysfunction (impaired relaxation).  The average left ventricular global longitudinal strain is -16.1 %. The  global longitudinal strain is  abnormal.   3. Right ventricular systolic function is normal. The right ventricular  size is normal. There is normal pulmonary artery systolic pressure. The  estimated right ventricular systolic pressure is 02.4 mmHg.   4. Left atrial size was moderately  dilated.   5. The mitral valve is degenerative. Trivial mitral valve regurgitation.  Moderate mitral annular calcification.   6. The inferior vena cava is normal in size with greater than 50%  respiratory variability, suggesting right atrial pressure of 3 mmHg.   Comparison(s): Compared to prior TTE in 11/2021, the AS appears moderate to  severe. Mean gradient increased from 26mHg to 232mg and AVA is now  0.9cm2 with DI 0.26.    IMPRESSION:  1. Moderate aortic stenosis  2. Stage 3b chronic kidney disease ([place])   3. Essential hypertension   4. Hyperlipidemia with target LDL less than 70   5. First degree AV block   6. Type 2 diabetes mellitus with microalbuminuria, with long-term current use of insulin [facility])     ASSESSMENT AND PLAN: Ms. Dolbow is a 89 year old African-American female who has a long-standing history of hypertension with grade 1 diastolic dysfunction and normal systolic function documented by her echocardiogram in September 2011. At that time she had aortic valve sclerosis without stenosis and  mitral annular calcification with trace MR and trace TR. over the years she has had progression of her aortic valve disease.  Her echo Doppler study from 08/23/2015 showed an ejection fraction of 55-60%, mild LVH and grade 1 diastolic dysfunction.  There was very mild aortic stenosis with a mean gradient of 10 and peak gradient of 22.  A 2-year follow-up assessment 08/09/2017 continued to show hyperdynamic LV function with slight increase in her mean gradient at 16 mm with peak of 30 and her October 2019 echo demonstrated mild increase in her gradient with a mean gradient of 23 and peak gradient of 37 mmHg. Her most recent echo of November 06, 2019 revealed  normal systolic function and essentially was unchanged with reference to the severity of aortic stenosis.  Her mean gradient was 19 with peak gradient 36.2.  Her most recent a echo of April 2022 now shows a mean gradient of 24 mm with  a peak gradient of 40.8 mmHg and AVA at 0.9 cm.  Her recent echo from November 25, 2020 shows stability and her mean gradient has reduced from 24 > 20 mm Hg with peak gradient from 40.8 down to 35.6. consistent with moderate AS.  Over the past year, she has remained asymptomatic with reference to chest pain.  He does experience some very mild shortness of breath particularly with uphill walking.  She denies presyncope or syncope.  [facility] and also does water aerobics.  I reviewed her most recent echo Doppler study from October 2023.  Mean gradient is again 24 mm with peak gradient now at 36 mmHg.  Her blood pressure today on recheck by me was 128/70.  She continues to be on amlodipine 5 mg, and has been taking nebivolol 5 mg twice a day.  She has a prescription for furosemide to take as needed.  Her resting pulse today is in the 80s.  She sees [name] and renal function is stable with creatinine of 1.4 consistent with her stage III CKD.  I have suggested slight titration of her nebivolol to 10 mg in the morning and 5 mg in the evening.  She is no longer on lisinopril.  She continues to be on rosuvastatin for hyperlipidemia.  She will be seeing Dr. Grier Mitts her primary provider in the near future who will be rechecking laboratory.  I have recommended a follow-up echo Doppler study in June 2024 and I will see her for follow-up evaluation shortly thereafter.     Troy Sine, MD, [facility]  12/04/2022 9:35 AM

## 2022-11-28 ENCOUNTER — Other Ambulatory Visit: Payer: Self-pay | Admitting: Cardiovascular Disease

## 2022-11-30 ENCOUNTER — Encounter: Payer: Self-pay | Admitting: Cardiovascular Disease

## 2022-11-30 ENCOUNTER — Ambulatory Visit: Payer: Medicare HMO | Attending: Cardiovascular Disease | Admitting: Cardiovascular Disease

## 2022-11-30 DIAGNOSIS — N1832 Chronic kidney disease, stage 3b: Secondary | ICD-10-CM

## 2022-11-30 DIAGNOSIS — I10 Essential (primary) hypertension: Secondary | ICD-10-CM

## 2022-11-30 DIAGNOSIS — E1129 Type 2 diabetes mellitus with other diabetic kidney complication: Secondary | ICD-10-CM

## 2022-11-30 DIAGNOSIS — I440 Atrioventricular block, first degree: Secondary | ICD-10-CM

## 2022-11-30 DIAGNOSIS — I350 Nonrheumatic aortic (valve) stenosis: Secondary | ICD-10-CM

## 2022-11-30 DIAGNOSIS — R809 Proteinuria, unspecified: Secondary | ICD-10-CM

## 2022-11-30 DIAGNOSIS — E785 Hyperlipidemia, unspecified: Secondary | ICD-10-CM | POA: Diagnosis not present

## 2022-11-30 DIAGNOSIS — Z794 Long term (current) use of insulin: Secondary | ICD-10-CM

## 2022-11-30 MED ORDER — NEBIVOLOL HCL 10 MG PO TABS: ORAL_TABLET | ORAL | 3 refills | Status: DC

## 2022-11-30 NOTE — Patient Instructions (Signed)
Medication Instructions:  Increase Nebivolol to '10mg'$  in the morning and '5mg'$  at night *If you need a refill on your cardiac medications before your next appointment, please call your pharmacy*   Testing/Procedures: Your physician has requested that you have an echocardiogram in June. Echocardiography is a painless test that uses sound waves to create images of your heart. It provides your doctor with information about the size and shape of your heart and how well your heart's chambers and valves are working. This procedure takes approximately one hour. There are no restrictions for this procedure. Please do NOT wear cologne, perfume, aftershave, or lotions (deodorant is allowed). Please arrive 15 minutes prior to your appointment time.    Follow-Up: At [facility], you and your health needs are our priority.  As part of our continuing mission to provide you with exceptional heart care, we have created designated Provider Care Teams.  These Care Teams include your primary Cardiologist (physician) and Advanced Practice Providers (APPs -  Physician Assistants and Nurse Practitioners) who all work together to provide you with the care you need, when you need it.  We recommend signing up for the patient portal called "MyChart".  Sign up information is provided on this After Visit Summary.  MyChart is used to connect with patients for Virtual Visits (Telemedicine).  Patients are able to view lab/test results, encounter notes, upcoming appointments, etc.  Non-urgent messages can be sent to your provider as well.   To learn more about what you can do with MyChart, go to [email or web address].    Your next appointment:   In June after Echo  The format for your next appointment:   In Person  Provider:   Shelva Majestic, MD     Other Instructions   Important Information About Sugar

## 2022-12-04 ENCOUNTER — Encounter: Payer: Self-pay | Admitting: Cardiovascular Disease

## 2023-01-04 ENCOUNTER — Ambulatory Visit (INDEPENDENT_AMBULATORY_CARE_PROVIDER_SITE_OTHER): Payer: Medicare HMO

## 2023-01-04 ENCOUNTER — Ambulatory Visit: Payer: Medicare HMO | Admitting: Podiatry

## 2023-01-04 VITALS — BP 134/53 | HR 73 | Temp 97.2°F

## 2023-01-04 DIAGNOSIS — M2142 Flat foot [pes planus] (acquired), left foot: Secondary | ICD-10-CM

## 2023-01-04 DIAGNOSIS — E1149 Type 2 diabetes mellitus with other diabetic neurological complication: Secondary | ICD-10-CM

## 2023-01-04 DIAGNOSIS — M2041 Other hammer toe(s) (acquired), right foot: Secondary | ICD-10-CM

## 2023-01-04 DIAGNOSIS — M2042 Other hammer toe(s) (acquired), left foot: Secondary | ICD-10-CM

## 2023-01-04 DIAGNOSIS — M79675 Pain in left toe(s): Secondary | ICD-10-CM

## 2023-01-04 DIAGNOSIS — M2141 Flat foot [pes planus] (acquired), right foot: Secondary | ICD-10-CM

## 2023-01-04 DIAGNOSIS — B351 Tinea unguium: Secondary | ICD-10-CM

## 2023-01-04 DIAGNOSIS — L84 Corns and callosities: Secondary | ICD-10-CM

## 2023-01-04 DIAGNOSIS — M79674 Pain in right toe(s): Secondary | ICD-10-CM | POA: Diagnosis not present

## 2023-01-04 NOTE — Progress Notes (Signed)
Subjective: Chief Complaint  Patient presents with   Routine Foot Care     3 Month RFC     89 year old female presents for above concerns.  She presents today for thick, elongated nails that she is unable to trim herself.  She also presents for diabetic shoe measurement.  Objective: AAO x3, NAD DP/PT pulses palpable bilaterally, CRT less than 3 seconds Nails continue be hypertrophic, dystrophic with yellow, brown discoloration.  There is no edema no erythema.  Tenderness nails 1-5 bilaterally. Hammertoes present.  Callus on medial left 2nd toe from where the 2nd toe and hallux are rubbing.  Bunion, hammertoes present.  Flatfoot present. No pain with calf compression, swelling, warmth, erythema  Assessment: Symptomatic onychomycosis  Plan: -All treatment options discussed with the patient including all alternatives, risks, complications.  -Sharply debrided the nails x 10 without any complications or bleeding.  -Measured for diabetic shoes today.  Order written. -Daily foot inspection, glucose control.  Trula Slade DPM

## 2023-01-04 NOTE — Progress Notes (Signed)
Patient presents to the office today for diabetic shoe and insole measuring.  Patient was measured with brannock device to determine size and width for 1 pair of extra depth shoes and foam casted for 3 pair of insoles.   ABN signed.   Documentation of medical necessity will be sent to patient's treating diabetic doctor to verify and sign.   Patient's diabetic provider: DR Docia Chuck and insoles will be ordered at that time and patient will be notified for an appointment for fitting when they arrive.   Brannock measurement: 9.5 W  Patient shoe selection-   1st   Shoe choice:   [id number]  Shoe size ordered: 9.5 W

## 2023-01-05 LAB — HEMOGLOBIN A1C: Hemoglobin A1C: 7.6

## 2023-01-05 LAB — LIPID PANEL
Cholesterol: 173 (ref 0–200)
HDL: 60 (ref 35–70)
Triglycerides: 77 (ref 40–160)

## 2023-01-06 LAB — LAB REPORT - SCANNED
A1c: 7.6
Microalbumin, Urine: 21.2

## 2023-01-15 ENCOUNTER — Other Ambulatory Visit: Payer: Self-pay | Admitting: Cardiovascular Disease

## 2023-02-05 ENCOUNTER — Encounter: Payer: Self-pay | Admitting: Family Medicine

## 2023-02-05 ENCOUNTER — Ambulatory Visit (INDEPENDENT_AMBULATORY_CARE_PROVIDER_SITE_OTHER): Payer: Medicare HMO | Admitting: Family Medicine

## 2023-02-05 VITALS — BP 118/58 | HR 96 | Temp 97.8°F | Ht 63.0 in | Wt 190.2 lb

## 2023-02-05 DIAGNOSIS — E1122 Type 2 diabetes mellitus with diabetic chronic kidney disease: Secondary | ICD-10-CM

## 2023-02-05 DIAGNOSIS — I5032 Chronic diastolic (congestive) heart failure: Secondary | ICD-10-CM

## 2023-02-05 DIAGNOSIS — E213 Hyperparathyroidism, unspecified: Secondary | ICD-10-CM | POA: Diagnosis not present

## 2023-02-05 DIAGNOSIS — Z794 Long term (current) use of insulin: Secondary | ICD-10-CM

## 2023-02-05 DIAGNOSIS — N1832 Chronic kidney disease, stage 3b: Secondary | ICD-10-CM

## 2023-02-05 DIAGNOSIS — R202 Paresthesia of skin: Secondary | ICD-10-CM | POA: Diagnosis not present

## 2023-02-05 DIAGNOSIS — I10 Essential (primary) hypertension: Secondary | ICD-10-CM

## 2023-02-05 NOTE — Progress Notes (Signed)
Established Patient Office Visit   Subjective  Patient ID: Bonnie Chavez, female    DOB: [birth year]  Age: 89 y.o. MRN: [id number]  Chief Complaint  Patient presents with   Medical Management of Chronic Issues    Pt is a pleasant 89 year old female with pmh sig for HTN, DM2, HLD, hypothyroidism seen for follow-up.  Patient doing well overall.  Still having tingling in hands.  More sore last week when the weather was colder.  Having nerve conduction study 03/03/2023.  Wearing splints.  States hands have gone up more which is affecting writing.  Patient seen by cardiology in January.  Has a repeat echo in June.  Noticing some edema in legs.  Taking nebivolol 10 mg in a.m. and 5 mg in p.m.  Thinks ongoing fatigue is cardiac related.  Using sliding scale Humalog 70/30: 4 units in a.m. if blood sugar greater than or equal to 100, 8 units for blood sugar 100-150, and 10 units for blood sugar 151-300.  Social hx:  Pt's grandkids in [place] are doing well.  Pt used to work at [employer] in [place].    Patient Active Problem List   Diagnosis Date Noted   Kidney disease 09/15/2018   Left ear impacted cerumen 02/01/2018   Hyperlipidemia associated with type 2 diabetes mellitus (HCC) 11/04/2017   Sensorineural hearing loss (SNHL) of both ears 01/19/2017   Mild aortic stenosis 03/10/2016   Hyperparathyroidism (HCC) 02/27/2016   Chronic low back pain 02/27/2016   Morbid obesity (HCC) 02/27/2016   Chronic diastolic congestive heart failure (HCC) 10/28/2015   Aortic valve sclerosis 08/15/2015   Chronic kidney disease 05/24/2014   Hypertension associated with diabetes (HCC) 08/21/2013   Hyperlipidemia 08/21/2013   PMB (postmenopausal bleeding) 05/15/2013   Multiple thyroid nodules 03/09/2013   Unspecified vitamin D deficiency 03/09/2013   Perimenopausal vasomotor symptoms 02/06/2010    Class: History of   Osteoarthritis 02/06/2010   Vaginal atrophy 01/12/2002    Class: History of    Diabetes mellitus, type II (HCC) 09/07/2001    Class: History of   Past Surgical History:  Procedure Laterality Date   CESAREAN SECTION  1972 &1976   CHOLECYSTECTOMY  1991   COLONOSCOPY     DILATATION & CURRETTAGE/HYSTEROSCOPY WITH RESECTOCOPE N/A 05/15/2013   Procedure: DILATATION & CURETTAGE/HYSTEROSCOPY WITH RESECTOCOPE;  Surgeon: Hal Morales, MD;  Location: [facility];  Service: Gynecology;  Laterality: N/A;   DILATATION & CURRETTAGE/HYSTEROSCOPY WITH RESECTOCOPE N/A 06/03/2017   Procedure: DILATATION & CURETTAGE/HYSTEROSCOPY;  Surgeon: Hal Morales, MD;  Location: [facility];  Service: Gynecology;  Laterality: N/A;   DILATION AND CURETTAGE OF UTERUS  1975   HEMORRHOID SURGERY  1997   KNEE SURGERY  2000 2011   right knee replacement    REPLACEMENT TOTAL KNEE  2000   left   SHOULDER SURGERY  2004 2010    TRANSTHORACIC ECHOCARDIOGRAM  07/2010   EF=>55%; LA mildly dilated; trace MR/TR;   TUBAL LIGATION  1976   Social History   Tobacco Use   Smoking status: Never   Smokeless tobacco: Never  Substance Use Topics   Alcohol use: No    Alcohol/week: 0.0 standard drinks of alcohol   Drug use: No   Family History  Problem Relation Age of Onset   Hypertension Mother    Diabetes Mother    Hyperlipidemia Mother    Heart attack Mother    Heart disease Father    Heart attack Father    Stroke Brother  Heart disease Brother    Diabetes Brother    Uterine cancer Maternal Grandmother    Allergies  Allergen Reactions   Contrast Media [Iodinated Contrast Media] Other (See Comments)    Reaction unknown   Lisinopril Hives    Cough, hives, and lip edema    Nsaids Other (See Comments)    Abnormal renal function   Penicillins Rash and Other (See Comments)    Pt has taken Keflex without difficulty Has patient had a PCN reaction causing immediate rash, facial/tongue/throat swelling, SOB or lightheadedness with hypotension: yes Has patient had a PCN reaction causing severe rash  involving mucus membranes or skin necrosis: no Has patient had a PCN reaction that required hospitalization no Has patient had a PCN reaction occurring within the last 10 years: no If all of the above answers are "NO", then may proceed with Cephalosporin use.      ROS Negative unless stated above    Objective:     BP (!) 118/58 (BP Location: Left Arm, Patient Position: Sitting, Cuff Size: Large)   Pulse 96   Temp 97.8 F (36.6 C) (Oral)   Ht 5\' 3"  (1.6 m)   Wt 190 lb 3.2 oz (86.3 kg)   SpO2 98%   BMI 33.69 kg/m  BP Readings from Last 3 Encounters:  02/05/23 (!) 118/58  01/04/23 (!) 134/53  11/30/22 138/68   Wt Readings from Last 3 Encounters:  02/05/23 190 lb 3.2 oz (86.3 kg)  11/30/22 188 lb 3.2 oz (85.4 kg)  11/06/22 188 lb 9.6 oz (85.5 kg)      Physical Exam Constitutional:      General: She is not in acute distress.    Appearance: Normal appearance.  HENT:     Head: Normocephalic and atraumatic.     Nose: Nose normal.     Mouth/Throat:     Mouth: Mucous membranes are moist.  Cardiovascular:     Rate and Rhythm: Normal rate and regular rhythm.     Heart sounds: Murmur heard.     Systolic murmur is present with a grade of 4/6.     No gallop.  Pulmonary:     Effort: Pulmonary effort is normal. No respiratory distress.     Breath sounds: Normal breath sounds. No wheezing, rhonchi or rales.  Skin:    General: Skin is warm and dry.  Neurological:     Mental Status: She is alert and oriented to person, place, and time.      Results for orders placed or performed in visit on 02/05/23  Iron, TIBC and Ferritin Panel  Result Value Ref Range   Ferritin 199    Iron 50    UIBC 285   CBC and differential  Result Value Ref Range   Hemoglobin 12.1 12.0 - 16.0   HCT 37 36 - 46   Platelets 174 150 - 400 K/uL   WBC 8.7   CBC  Result Value Ref Range   RBC 3.84 (A) 3.87 - 5.11  Basic metabolic panel  Result Value Ref Range   Glucose 135    BUN 32 (A) 4 - 21    CO2 26 (A) 13 - 22   Creatinine 1.4 (A) 0.5 - 1.1   Potassium 4.0 3.5 - 5.1 mEq/L   Sodium 139 137 - 147   Chloride 103 99 - 108  Comprehensive metabolic panel  Result Value Ref Range   eGFR 37    Calcium 10.0 8.7 - 10.7   Albumin  4.2 3.5 - 5.0  Lipid panel  Result Value Ref Range   Triglycerides 77 40 - 160   Cholesterol 173 0 - 200   HDL 60 35 - 70  Results for orders placed or performed in visit on 02/05/23  Hemoglobin A1c  Result Value Ref Range   Hemoglobin A1C 7.6       Assessment & Plan:  Type 2 diabetes mellitus with stage 3b chronic kidney disease, with long-term current use of insulin (HCC) -Continue sliding scale insulin Humulin 70/30: 4 units> or equal to 100, 8 units 100-150, 10 units 151-300 -Hemoglobin A1c 7.6% on 01/05/2023 -Continue lifestyle modifications -Continue statin.  ACE I intolerance  Paresthesia -Possibly related to carpal tunnel -Continue supportive care including splinting, stretching, etc -Continue follow-up with neurology for nerve conduction study next month.  Chronic diastolic congestive heart failure (HCC) -Stable -Euvolemic on exam -Continue Bystolic 10 mg in a.m. and 5 mg in p.m., Lasix 20 mg daily -Continue follow-up with cardiology  Hyperparathyroidism (HCC) -Stable -Continue follow-up with nephrology  Essential hypertension -Controlled -Continue Bystolic 10 mg in a.m. and 5 mg in p.m., Norvasc 5 mg daily, Lasix 20 mg daily   Return in about 4 months (around 06/07/2023).   Deeann Saint, MD

## 2023-02-08 ENCOUNTER — Ambulatory Visit (INDEPENDENT_AMBULATORY_CARE_PROVIDER_SITE_OTHER): Payer: Medicare HMO

## 2023-02-08 DIAGNOSIS — M2141 Flat foot [pes planus] (acquired), right foot: Secondary | ICD-10-CM

## 2023-02-08 DIAGNOSIS — M2042 Other hammer toe(s) (acquired), left foot: Secondary | ICD-10-CM

## 2023-02-08 DIAGNOSIS — M2041 Other hammer toe(s) (acquired), right foot: Secondary | ICD-10-CM

## 2023-02-08 DIAGNOSIS — M2142 Flat foot [pes planus] (acquired), left foot: Secondary | ICD-10-CM

## 2023-02-08 DIAGNOSIS — E1149 Type 2 diabetes mellitus with other diabetic neurological complication: Secondary | ICD-10-CM | POA: Diagnosis not present

## 2023-02-08 NOTE — Patient Instructions (Signed)
CHANGE INSERTS EVERY 4 MONTHS. Ex. Change on Labor Day and Christmas.

## 2023-02-10 ENCOUNTER — Other Ambulatory Visit: Payer: Medicare HMO

## 2023-03-15 ENCOUNTER — Telehealth: Payer: Self-pay | Admitting: Cardiovascular Disease

## 2023-03-15 NOTE — Telephone Encounter (Signed)
   Pre-operative Risk Assessment    Patient Name: Bonnie Chavez  DOB: June 07, 1934 MRN: [id number]      Request for Surgical Clearance    Procedure:   right carpool tunnel release   Date of Surgery:  Clearance 04/29/23                                 Surgeon:  Dr. Betha Loa Surgeon's Group or Practice Name:  [facility] Phone number:  [phone] Fax number:  [phone]   Type of Clearance Requested:   - Medical  - Pharmacy:  Hold Aspirin TBD   Type of Anesthesia:   CHOICE   Additional requests/questions:      Melissa Noon   03/15/2023, 4:05 PM

## 2023-03-16 ENCOUNTER — Other Ambulatory Visit: Payer: Self-pay | Admitting: Orthopedic Surgery

## 2023-03-16 ENCOUNTER — Telehealth: Payer: Self-pay | Admitting: *Deleted

## 2023-03-16 NOTE — Telephone Encounter (Signed)
Pt has been scheduled for echo 03/24/23 and tele pre op appt 03/29/23. Med rec and consent are done.     Patient Consent for Virtual Visit        Bonnie Chavez has provided verbal consent on 03/16/2023 for a virtual visit (video or telephone).   CONSENT FOR VIRTUAL VISIT FOR:  Bonnie Chavez  By participating in this virtual visit I agree to the following:  I hereby voluntarily request, consent and authorize [facility] and its employed or contracted physicians, physician assistants, nurse practitioners or other licensed health care professionals (the Practitioner), to provide me with telemedicine health care services (the "Services") as deemed necessary by the treating Practitioner. I acknowledge and consent to receive the Services by the Practitioner via telemedicine. I understand that the telemedicine visit will involve communicating with the Practitioner through live audiovisual communication technology and the disclosure of certain medical information by electronic transmission. I acknowledge that I have been given the opportunity to request an in-person assessment or other available alternative prior to the telemedicine visit and am voluntarily participating in the telemedicine visit.  I understand that I have the right to withhold or withdraw my consent to the use of telemedicine in the course of my care at any time, without affecting my right to future care or treatment, and that the Practitioner or I may terminate the telemedicine visit at any time. I understand that I have the right to inspect all information obtained and/or recorded in the course of the telemedicine visit and may receive copies of available information for a reasonable fee.  I understand that some of the potential risks of receiving the Services via telemedicine include:  Delay or interruption in medical evaluation due to technological equipment failure or disruption; Information transmitted may not be sufficient  (e.g. poor resolution of images) to allow for appropriate medical decision making by the Practitioner; and/or  In rare instances, security protocols could fail, causing a breach of personal health information.  Furthermore, I acknowledge that it is my responsibility to provide information about my medical history, conditions and care that is complete and accurate to the best of my ability. I acknowledge that Practitioner's advice, recommendations, and/or decision may be based on factors not within their control, such as incomplete or inaccurate data provided by me or distortions of diagnostic images or specimens that may result from electronic transmissions. I understand that the practice of medicine is not an exact science and that Practitioner makes no warranties or guarantees regarding treatment outcomes. I acknowledge that a copy of this consent can be made available to me via my patient portal [facility] MyChart), or I can request a printed copy by calling the office of [facility].    I understand that my insurance will be billed for this visit.   I have read or had this consent read to me. I understand the contents of this consent, which adequately explains the benefits and risks of the Services being provided via telemedicine.  I have been provided ample opportunity to ask questions regarding this consent and the Services and have had my questions answered to my satisfaction. I give my informed consent for the services to be provided through the use of telemedicine in my medical care

## 2023-03-16 NOTE — Telephone Encounter (Signed)
Primary Cardiologist:Thomas Tresa Endo, MD   Preoperative team, please contact this patient and set up a phone call appointment for further preoperative risk assessment. Please obtain consent and complete medication review. Thank you for your help.   From a cardiac perspective, pending no s/s of ACS at time of virtual visit, she may hold aspirin for 5-7 days as requested by surgeon. Need to verify no additional indication for aspirin therapy.   Levi Aland, NP-C  03/16/2023, 8:26 AM [address] Office [phone] Fax [phone]

## 2023-03-16 NOTE — Telephone Encounter (Signed)
I s/w the pt and informed her that we are going to need to mover her echo up sooner and then schedule a tele visit for pre op. Pt aware Misty Stanley, echo scheduler is trying to find a sooner appt for pre op.  Pt thanked me for the call and my help.

## 2023-03-16 NOTE — Telephone Encounter (Signed)
Left message to call back to schedule a tele pre op appt.  ?

## 2023-03-16 NOTE — Telephone Encounter (Signed)
Pt has been scheduled for echo 03/24/23 and tele pre op appt 03/29/23. Med rec and consent are done.

## 2023-03-16 NOTE — Telephone Encounter (Signed)
Pt returning call

## 2023-03-24 ENCOUNTER — Ambulatory Visit (HOSPITAL_COMMUNITY): Payer: Medicare HMO | Attending: Cardiovascular Disease

## 2023-03-24 DIAGNOSIS — I517 Cardiomegaly: Secondary | ICD-10-CM | POA: Diagnosis not present

## 2023-03-24 DIAGNOSIS — I5030 Unspecified diastolic (congestive) heart failure: Secondary | ICD-10-CM | POA: Diagnosis not present

## 2023-03-24 DIAGNOSIS — I350 Nonrheumatic aortic (valve) stenosis: Secondary | ICD-10-CM | POA: Insufficient documentation

## 2023-03-24 DIAGNOSIS — I080 Rheumatic disorders of both mitral and aortic valves: Secondary | ICD-10-CM

## 2023-03-26 LAB — ECHOCARDIOGRAM COMPLETE
AR max vel: 0.66 cm2
AV Area VTI: 0.72 cm2
AV Area mean vel: 0.62 cm2
AV Mean grad: 32 mmHg
AV Peak grad: 53.3 mmHg
Ao pk vel: 3.65 m/s
Area-P 1/2: 3.37 cm2
S' Lateral: 2.9 cm

## 2023-03-29 ENCOUNTER — Ambulatory Visit: Payer: Medicare HMO | Attending: Cardiology | Admitting: Student

## 2023-03-29 DIAGNOSIS — Z01810 Encounter for preprocedural cardiovascular examination: Secondary | ICD-10-CM | POA: Diagnosis not present

## 2023-03-29 NOTE — Progress Notes (Signed)
Virtual Visit via Telephone Note   Because of Bonnie Chavez's co-morbid illnesses, she is at least at moderate risk for complications without adequate follow up.  This format is felt to be most appropriate for this patient at this time.  The patient did not have access to video technology/had technical difficulties with video requiring transitioning to audio format only (telephone).  All issues noted in this document were discussed and addressed.  No physical exam could be performed with this format.  Please refer to the patient's chart for her consent to telehealth for [facility].  Evaluation Performed:  Preoperative cardiovascular risk assessment _____________   Date:  03/29/2023   Patient ID:  Bonnie Chavez, DOB Jan 09, 1934, MRN [id number] Patient Location:  Home Provider location:   Office  Primary Care Provider:  Marykay Lex, MD Primary Cardiologist:  Nicki Guadalajara, MD  Chief Complaint / Patient Profile   89 y.o. y/o female with a h/o hypertension, hyperlipidemia, Grade I DD, T2DM, CKD stage IIIb, moderate aortic stenosis who is pending right Carpal tunnel release by Dr. Merlyn Lot and presents today for telephonic preoperative cardiovascular risk assessment.  History of Present Illness    Bonnie Chavez is a 89 y.o. female who presents via audio/video conferencing for a telehealth visit today.  Pt was last seen in cardiology clinic on 11/30/2022 by Dr. Tresa Endo.  At that time [name] was doing well.  The patient is now pending procedure as outlined above. Since her last visit, she is stable from a cardiac standpoint. No chest pain, pressure, or tightness. Denies lower extremity edema, orthopnea, or PND. No palpitations. She reports some mild dyspnea with heavier exertion and less with normal activities. She also tires easily. These symptoms are at baseline and essentially unchanged for years. She is very active doing water aerobics twice a week at the [facility]. She underwent echo  which showed worsening aortic stenosis. Dr. Tresa Endo feels she will be okay for surgery.   Past Medical History    Past Medical History:  Diagnosis Date   Anemia    History of , resolved   Aortic valve sclerosis    Carpal tunnel syndrome    Chicken pox    Chronic low back pain 02/27/2016   -DDD -seeing [place] [facility]    Diabetes mellitus    type 2, sees Dr. Talmage Nap   Goiter    Dr. Talmage Nap   Heart murmur    History of nuclear stress test 06/2010   dipyridamole; normal pattern of perfusion; ekg negative for ischemia; low risk scan    Hot flashes    Hyperlipidemia    Hyperparathyroidism (HCC) 02/27/2016   -sees endocrinologist, Dr. Talmage Nap, has seen surgeon as well    Hypertension    Migraine    no longer has migraines (80's)   Mitral valve prolapse    Neuropathy    Obesity 02/27/2016   Osteoarthritis    DDD, sees [place] [facility]   PMB (postmenopausal bleeding) 02/28/2009   Resolved   Pneumonia    history of    Renal insufficiency    Dr. Darrick Penna    Vaginal atrophy    Vitamin D deficiency    Past Surgical History:  Procedure Laterality Date   CESAREAN SECTION  1972 &1976   CHOLECYSTECTOMY  1991   COLONOSCOPY     DILATATION & CURRETTAGE/HYSTEROSCOPY WITH RESECTOCOPE N/A 05/15/2013   Procedure: DILATATION & CURETTAGE/HYSTEROSCOPY WITH RESECTOCOPE;  Surgeon: Hal Morales, MD;  Location: [facility];  Service: Gynecology;  Laterality: N/A;   DILATATION & CURRETTAGE/HYSTEROSCOPY WITH RESECTOCOPE N/A 06/03/2017   Procedure: DILATATION & CURETTAGE/HYSTEROSCOPY;  Surgeon: Hal Morales, MD;  Location: [facility];  Service: Gynecology;  Laterality: N/A;   DILATION AND CURETTAGE OF UTERUS  1975   HEMORRHOID SURGERY  1997   KNEE SURGERY  2000 2011   right knee replacement    REPLACEMENT TOTAL KNEE  2000   left   SHOULDER SURGERY  2004 2010    TRANSTHORACIC ECHOCARDIOGRAM  07/2010   EF=>55%; LA mildly dilated; trace MR/TR;   TUBAL LIGATION  1976    Allergies  Allergies  Allergen  Reactions   Contrast Media [Iodinated Contrast Media] Other (See Comments)    Reaction unknown   Lisinopril Hives    Cough, hives, and lip edema    Nsaids Other (See Comments)    Abnormal renal function   Penicillins Rash and Other (See Comments)    Pt has taken Keflex without difficulty Has patient had a PCN reaction causing immediate rash, facial/tongue/throat swelling, SOB or lightheadedness with hypotension: yes Has patient had a PCN reaction causing severe rash involving mucus membranes or skin necrosis: no Has patient had a PCN reaction that required hospitalization no Has patient had a PCN reaction occurring within the last 10 years: no If all of the above answers are "NO", then may proceed with Cephalosporin use.    Home Medications    Prior to Admission medications   Medication Sig Start Date End Date Taking? Authorizing Provider  acetaminophen (TYLENOL) 500 MG tablet Take 1 tablet (500 mg total) by mouth every 4 (four) hours as needed for moderate pain. 06/03/17   Haygood, Maris Berger, MD  amLODipine (NORVASC) 5 MG tablet Take 1 tablet (5 mg total) by mouth daily. 05/04/22   Burchette, Elberta Fortis, MD  aspirin 81 MG EC tablet Take 81 mg by mouth at bedtime. Swallow whole.    [provider]  BD PEN NEEDLE NANO 2ND GEN 32G X 4 MM MISC USE AS DIRECTED 90 07/24/20   [provider]  cephALEXin (KEFLEX) 500 MG capsule Take 500-1,000 mg by mouth See admin instructions. TAKE ONLY FOR INVASIVE DENTAL PROCEDURES Patient not taking: Reported on 02/05/2023    [provider]  cinacalcet (SENSIPAR) 30 MG tablet 30 mg as directed. PER PT TAKES 3 TIMES WEEKLY M, W, F 11/01/19   [provider]  furosemide (LASIX) 20 MG tablet Take 40 mg by mouth daily. Can take additional tablet if needed 05/19/18   Terressa Koyanagi, DO  gabapentin (NEURONTIN) 100 MG capsule Take 100 mg by mouth at bedtime.    [provider]  HUMULIN 70/30 KWIKPEN (70-30) 100 UNIT/ML KwikPen  Inject 10 Units into the skin every evening. 4 units in the AM 02/24/21   [provider]  levothyroxine (SYNTHROID) 50 MCG tablet Take 50 mcg by mouth daily before breakfast.    [provider]  nebivolol (BYSTOLIC) 10 MG tablet TAKE 0.5 TABLETS BY MOUTH 2 TIMES DAILY. 01/15/23   Lennette Bihari, MD  OneTouch Delica Lancets 33G MISC Apply topically. 08/12/20   [provider]  [facility] VERIO test strip 1 each 2 (two) times daily. 10/27/20   [provider]  rosuvastatin (CRESTOR) 40 MG tablet TAKE 1 TABLET BY MOUTH EVERYDAY AT BEDTIME 08/17/22   Lennette Bihari, MD    Physical Exam    Vital Signs:  Anarosa Roulston does not have vital signs available for review today.  Given  telephonic nature of communication, physical exam is limited. AAOx3. NAD. Normal affect.  Speech and respirations are unlabored.  Accessory Clinical Findings    None  Assessment & Plan    Primary Cardiologist: Nicki Guadalajara, MD  Preoperative cardiovascular risk assessment. Right carpal tunnel release by Dr. Merlyn Lot.  Chart reviewed as part of pre-operative protocol coverage. According to the RCRI, patient has a 0.4% risk of MACE. Patient reports activity equivalent to 4.0 METS (water aerobics twice a week at the [facility], independent with ADLs).   Given past medical history and time since last visit, based on ACC/AHA guidelines, Ahlam Chezem would be at acceptable risk for the planned procedure without further cardiovascular testing.   Patient was advised that if she develops new symptoms prior to surgery to contact our office to arrange a follow-up appointment.  she verbalized understanding.  Ideally aspirin should be continued without interruption, however if the bleeding risk is too great, aspirin may be held for 5-7 days prior to surgery. Please resume aspirin post operatively when it is felt to be safe from a bleeding standpoint.    I will route this recommendation to the requesting  party via Epic fax function.  Please call with questions.  Time:   Today, I have spent 8 minutes with the patient with telehealth technology discussing medical history, symptoms, and management plan.     Carlos Levering, NP  03/29/2023, 8:29 AM

## 2023-04-05 ENCOUNTER — Encounter: Payer: Self-pay | Admitting: Podiatry

## 2023-04-05 ENCOUNTER — Ambulatory Visit: Payer: Medicare HMO | Admitting: Podiatry

## 2023-04-05 DIAGNOSIS — M79674 Pain in right toe(s): Secondary | ICD-10-CM | POA: Diagnosis not present

## 2023-04-05 DIAGNOSIS — E1149 Type 2 diabetes mellitus with other diabetic neurological complication: Secondary | ICD-10-CM

## 2023-04-05 DIAGNOSIS — M79675 Pain in left toe(s): Secondary | ICD-10-CM | POA: Diagnosis not present

## 2023-04-05 DIAGNOSIS — B351 Tinea unguium: Secondary | ICD-10-CM

## 2023-04-05 NOTE — Progress Notes (Signed)
Subjective: Chief Complaint  Patient presents with   Debridement    Trim toenails/check feet-no new foot concerns, did mention that the strap on her shoe is loose    89 year old female presents for above concerns.  She presents today for thick, elongated nails that she is unable to trim herself.  She states that she thinks the strap on her shoe may be loose on her right side.  No open lesions she reports.  Objective: AAO x3, NAD DP/PT pulses palpable bilaterally, CRT less than 3 seconds Nails continue be hypertrophic, dystrophic with yellow, brown discoloration.  There is no edema no erythema.  Tenderness nails 1-5 bilaterally. Hammertoes present.  Callus on medial left 2nd toe from where the 2nd toe and hallux are rubbing.  Bunion, hammertoes present.  Flatfoot present. No pain with calf compression, swelling, warmth, erythema  Assessment: Symptomatic onychomycosis  Plan: -All treatment options discussed with the patient including all alternatives, risks, complications.  -Sharply debrided the nails x 10 without any complications or bleeding.  -We turned her shoes on the March help walk.  The shoe appears to be fitting.  The strap is also intact.  The patient is causing issues to stop wearing them and let me know. -Daily foot inspection, glucose control.  Vivi Barrack DPM

## 2023-04-07 ENCOUNTER — Other Ambulatory Visit: Payer: Self-pay | Admitting: Cardiovascular Disease

## 2023-04-12 ENCOUNTER — Other Ambulatory Visit: Payer: Self-pay | Admitting: *Deleted

## 2023-04-12 MED ORDER — NEBIVOLOL HCL 10 MG PO TABS: ORAL_TABLET | ORAL | 3 refills | Status: DC

## 2023-04-20 ENCOUNTER — Encounter (HOSPITAL_BASED_OUTPATIENT_CLINIC_OR_DEPARTMENT_OTHER): Payer: Self-pay | Admitting: Orthopedic Surgery

## 2023-04-20 ENCOUNTER — Other Ambulatory Visit: Payer: Self-pay

## 2023-04-20 NOTE — Progress Notes (Signed)
   04/20/23 1409  PAT Phone Screen  Is the patient taking a GLP-1 receptor agonist? No  Do You Have Diabetes? (S)  Yes  Do You Have Hypertension? (S)  Yes  Have You Ever Been to the ER for Asthma? No  Have You Taken Oral Steroids in the Past 3 Months? No  Do you Take Phenteramine or any Other Diet Drugs? No  Recent  Lab Work, EKG, CXR? No  Do you have a history of heart problems? (S)  Yes (CHF)  Cardiologist Name (S)  Nicki Guadalajara, MD  Have you ever had tests on your heart? Yes  What cardiac tests were performed? (S)  Echo  What date/year were cardiac tests completed? 03/24/23, EF 60-65%  Results viewable: [facility] Media Tab  Height 5\' 3"  (1.6 m)  Weight 86.3 kg  Pat Appointment Scheduled Yes

## 2023-04-21 ENCOUNTER — Encounter (HOSPITAL_BASED_OUTPATIENT_CLINIC_OR_DEPARTMENT_OTHER)
Admission: RE | Admit: 2023-04-21 | Discharge: 2023-04-21 | Disposition: A | Discharge status: Home / Self Care | Payer: Medicare HMO | Source: Ambulatory Visit | Attending: Orthopedic Surgery | Admitting: Orthopedic Surgery

## 2023-04-21 DIAGNOSIS — E1122 Type 2 diabetes mellitus with diabetic chronic kidney disease: Secondary | ICD-10-CM | POA: Diagnosis not present

## 2023-04-21 DIAGNOSIS — I440 Atrioventricular block, first degree: Secondary | ICD-10-CM | POA: Diagnosis not present

## 2023-04-21 DIAGNOSIS — I491 Atrial premature depolarization: Secondary | ICD-10-CM | POA: Insufficient documentation

## 2023-04-21 DIAGNOSIS — Z794 Long term (current) use of insulin: Secondary | ICD-10-CM | POA: Diagnosis not present

## 2023-04-21 DIAGNOSIS — N1832 Chronic kidney disease, stage 3b: Secondary | ICD-10-CM | POA: Insufficient documentation

## 2023-04-21 DIAGNOSIS — Z01818 Encounter for other preprocedural examination: Secondary | ICD-10-CM | POA: Diagnosis present

## 2023-04-21 LAB — BASIC METABOLIC PANEL
Anion gap: 9 (ref 5–15)
BUN: 31 mg/dL — ABNORMAL HIGH (ref 8–23)
CO2: 26 mmol/L (ref 22–32)
Calcium: 9.9 mg/dL (ref 8.9–10.3)
Chloride: 103 mmol/L (ref 98–111)
Creatinine, Ser: 1.43 mg/dL — ABNORMAL HIGH (ref 0.44–1.00)
GFR, Estimated: 35 mL/min — ABNORMAL LOW (ref >60)
Glucose, Bld: 231 mg/dL — ABNORMAL HIGH (ref 70–99)
Potassium: 3.8 mmol/L (ref 3.5–5.1)
Sodium: 138 mmol/L (ref 135–145)

## 2023-04-21 NOTE — Progress Notes (Signed)

## 2023-04-27 ENCOUNTER — Other Ambulatory Visit (HOSPITAL_COMMUNITY): Payer: Medicare HMO

## 2023-04-28 NOTE — Progress Notes (Signed)
Chart reviewed with Dr. Bradley Ferris, due to severe aortic stenosis, patient is not a candidate for [facility] surgery and will need to have case moved to main. Message left for [facility] at Dr. Sheran Fava office.

## 2023-04-29 ENCOUNTER — Encounter (HOSPITAL_COMMUNITY): Payer: Self-pay | Admitting: Orthopedic Surgery

## 2023-04-29 DIAGNOSIS — E1122 Type 2 diabetes mellitus with diabetic chronic kidney disease: Secondary | ICD-10-CM

## 2023-04-29 NOTE — Progress Notes (Signed)
Ms Bonnie Chavez denies chest pain or shortness of breath at this time. Patient denies having any s/s of Covid in her household, also denies any known exposure to Covid. Ms. Bonnie Chavez denies  any s/s of upper or lower respiratory in the past 8 weeks.  Ms Bonnie Chavez has history of severe aortic stenosis, Dr. Tresa Endo is patient's cardiologist. Dr. Talmage Chavez is [name]'s endocrinologist,  Dr. Abbe Bonnie Chavez is patient's PCP, Patient is seen at [facility] for Stage III kidney failure. Ms Bonnie Chavez has cardiac clearance for this surgery from Dr. Tresa Endo.  Ms Bonnie Chavez has type II, patient reports that CBG's run 120- 150, patient has a high one at 199, patient realized she did not purchase diet soda, other CBGs are 120-150.  I instructed Ms Bonnie Chavez to take 7 units of 70/30 Insulin with evening meal; zero Insulin in am.I instructed Ms Bonnie Chavez to check CBG after awaking and every 2 hours until arrival  to the hospital.  I Instructed patient if CBG is less than 70 to take 4 Glucose Tablets or 1 tube of Glucose Gel or 1/2 cup of a clear juice. Recheck CBG in 15 minutes if CBG is not over 70 call, pre- op desk at [phone] for further instructions.

## 2023-04-30 ENCOUNTER — Ambulatory Visit (HOSPITAL_BASED_OUTPATIENT_CLINIC_OR_DEPARTMENT_OTHER): Payer: Medicare HMO | Admitting: Anesthesiology

## 2023-04-30 ENCOUNTER — Encounter (HOSPITAL_COMMUNITY)
Admission: RE | Disposition: A | Discharge status: Home / Self Care | Payer: Self-pay | Source: Home / Self Care | Attending: Orthopedic Surgery

## 2023-04-30 ENCOUNTER — Ambulatory Visit (HOSPITAL_COMMUNITY): Payer: Medicare HMO | Admitting: Anesthesiology

## 2023-04-30 ENCOUNTER — Other Ambulatory Visit: Payer: Self-pay

## 2023-04-30 ENCOUNTER — Encounter (HOSPITAL_COMMUNITY): Payer: Self-pay | Admitting: Orthopedic Surgery

## 2023-04-30 ENCOUNTER — Ambulatory Visit (HOSPITAL_COMMUNITY)
Admission: RE | Admit: 2023-04-30 | Discharge: 2023-04-30 | Disposition: A | Discharge status: Home / Self Care | Payer: Medicare HMO | Attending: Orthopedic Surgery | Admitting: Orthopedic Surgery

## 2023-04-30 DIAGNOSIS — G5601 Carpal tunnel syndrome, right upper limb: Secondary | ICD-10-CM | POA: Diagnosis not present

## 2023-04-30 DIAGNOSIS — Z794 Long term (current) use of insulin: Secondary | ICD-10-CM

## 2023-04-30 DIAGNOSIS — I110 Hypertensive heart disease with heart failure: Secondary | ICD-10-CM

## 2023-04-30 DIAGNOSIS — I350 Nonrheumatic aortic (valve) stenosis: Secondary | ICD-10-CM | POA: Diagnosis not present

## 2023-04-30 DIAGNOSIS — E119 Type 2 diabetes mellitus without complications: Secondary | ICD-10-CM | POA: Insufficient documentation

## 2023-04-30 DIAGNOSIS — D759 Disease of blood and blood-forming organs, unspecified: Secondary | ICD-10-CM | POA: Insufficient documentation

## 2023-04-30 DIAGNOSIS — D649 Anemia, unspecified: Secondary | ICD-10-CM | POA: Diagnosis not present

## 2023-04-30 DIAGNOSIS — I509 Heart failure, unspecified: Secondary | ICD-10-CM | POA: Diagnosis not present

## 2023-04-30 HISTORY — DX: Heart failure, unspecified: I50.9

## 2023-04-30 HISTORY — PX: CARPAL TUNNEL RELEASE: SHX101

## 2023-04-30 HISTORY — DX: Personal history of other medical treatment: Z92.89

## 2023-04-30 LAB — BASIC METABOLIC PANEL
Anion gap: 10 (ref 5–15)
BUN: 27 mg/dL — ABNORMAL HIGH (ref 8–23)
CO2: 23 mmol/L (ref 22–32)
Calcium: 9.8 mg/dL (ref 8.9–10.3)
Chloride: 104 mmol/L (ref 98–111)
Creatinine, Ser: 1.39 mg/dL — ABNORMAL HIGH (ref 0.44–1.00)
GFR, Estimated: 37 mL/min — ABNORMAL LOW (ref >60)
Glucose, Bld: 155 mg/dL — ABNORMAL HIGH (ref 70–99)
Potassium: 3.8 mmol/L (ref 3.5–5.1)
Sodium: 137 mmol/L (ref 135–145)

## 2023-04-30 LAB — CBC
HCT: 38.2 % (ref 36.0–46.0)
Hemoglobin: 12.5 g/dL (ref 12.0–15.0)
MCH: 31.3 pg (ref 26.0–34.0)
MCHC: 32.7 g/dL (ref 30.0–36.0)
MCV: 95.5 fL (ref 80.0–100.0)
Platelets: 181 10*3/uL (ref 150–400)
RBC: 4 MIL/uL (ref 3.87–5.11)
RDW: 12.9 % (ref 11.5–15.5)
WBC: 8.5 10*3/uL (ref 4.0–10.5)
nRBC: 0 % (ref 0.0–0.2)

## 2023-04-30 LAB — GLUCOSE, CAPILLARY
Glucose-Capillary: 182 mg/dL — ABNORMAL HIGH (ref 70–99)
Glucose-Capillary: 138 mg/dL — ABNORMAL HIGH (ref 70–99)
Glucose-Capillary: 138 mg/dL — ABNORMAL HIGH (ref 70–99)

## 2023-04-30 SURGERY — CARPAL TUNNEL RELEASE
Anesthesia: Monitor Anesthesia Care | Site: Hand | Laterality: Right

## 2023-04-30 MED ORDER — ORAL CARE MOUTH RINSE: 15.0000 mL | Freq: Once | OROMUCOSAL | Status: AC

## 2023-04-30 MED ORDER — LACTATED RINGERS IV SOLN: INTRAVENOUS | Status: DC

## 2023-04-30 MED ORDER — HYDROCODONE-ACETAMINOPHEN 5-325 MG PO TABS: ORAL_TABLET | ORAL | 0 refills | Status: DC

## 2023-04-30 MED ORDER — LIDOCAINE HCL (PF) 1 % IJ SOLN
INTRAMUSCULAR | Status: DC | PRN
  Administered 2023-04-30: 4.5 mL

## 2023-04-30 MED ORDER — LIDOCAINE HCL (PF) 1 % IJ SOLN
INTRAMUSCULAR | Status: AC
  Filled 2023-04-30: qty 30

## 2023-04-30 MED ORDER — CHLORHEXIDINE GLUCONATE 0.12 % MT SOLN
OROMUCOSAL | Status: AC
  Filled 2023-04-30: qty 15

## 2023-04-30 MED ORDER — PROPOFOL 10 MG/ML IV BOLUS
INTRAVENOUS | Status: DC | PRN
  Administered 2023-04-30 (×2): 20 mg via INTRAVENOUS

## 2023-04-30 MED ORDER — INSULIN ASPART 100 UNIT/ML IJ SOLN
0.0000 [IU] | INTRAMUSCULAR | Status: DC | PRN
  Administered 2023-04-30: 2 [IU] via SUBCUTANEOUS
  Filled 2023-04-30: qty 1

## 2023-04-30 MED ORDER — 0.9 % SODIUM CHLORIDE (POUR BTL) OPTIME
TOPICAL | Status: DC | PRN
  Administered 2023-04-30: 1000 mL

## 2023-04-30 MED ORDER — CEFAZOLIN SODIUM-DEXTROSE 2-4 GM/100ML-% IV SOLN
2.0000 g | INTRAVENOUS | Status: AC
  Administered 2023-04-30: 2 g via INTRAVENOUS

## 2023-04-30 MED ORDER — BUPIVACAINE HCL (PF) 0.25 % IJ SOLN
INTRAMUSCULAR | Status: AC
  Filled 2023-04-30: qty 30

## 2023-04-30 MED ORDER — ACETAMINOPHEN 500 MG PO TABS
ORAL_TABLET | ORAL | Status: AC
  Filled 2023-04-30: qty 2

## 2023-04-30 MED ORDER — ACETAMINOPHEN 500 MG PO TABS
1000.0000 mg | ORAL_TABLET | Freq: Once | ORAL | Status: AC
  Administered 2023-04-30: 1000 mg via ORAL

## 2023-04-30 MED ORDER — FENTANYL CITRATE (PF) 100 MCG/2ML IJ SOLN: 25.0000 ug | INTRAMUSCULAR | Status: DC | PRN

## 2023-04-30 MED ORDER — CHLORHEXIDINE GLUCONATE 0.12 % MT SOLN
15.0000 mL | Freq: Once | OROMUCOSAL | Status: AC
  Administered 2023-04-30: 15 mL via OROMUCOSAL

## 2023-04-30 MED ORDER — CEFAZOLIN SODIUM-DEXTROSE 2-4 GM/100ML-% IV SOLN
INTRAVENOUS | Status: AC
  Filled 2023-04-30: qty 100

## 2023-04-30 MED ORDER — BUPIVACAINE HCL (PF) 0.25 % IJ SOLN
INTRAMUSCULAR | Status: DC | PRN
  Administered 2023-04-30: 4.5 mL

## 2023-04-30 SURGICAL SUPPLY — 45 items
APL PRP STRL LF DISP 70% ISPRP (MISCELLANEOUS) ×1
BAG COUNTER SPONGE SURGICOUNT (BAG) ×1 IMPLANT
BAG SPNG CNTER NS LX DISP (BAG) ×1
BNDG CMPR 5X3 KNIT ELC UNQ LF (GAUZE/BANDAGES/DRESSINGS)
BNDG CMPR 9X4 STRL LF SNTH (GAUZE/BANDAGES/DRESSINGS)
BNDG ELASTIC 3INX 5YD STR LF (GAUZE/BANDAGES/DRESSINGS) ×1 IMPLANT
BNDG ELASTIC 4X5.8 VLCR STR LF (GAUZE/BANDAGES/DRESSINGS) ×1 IMPLANT
BNDG ESMARK 4X9 LF (GAUZE/BANDAGES/DRESSINGS) ×1 IMPLANT
BNDG GAUZE DERMACEA FLUFF 4 (GAUZE/BANDAGES/DRESSINGS) ×1 IMPLANT
BNDG GZE DERMACEA 4 6PLY (GAUZE/BANDAGES/DRESSINGS) ×1
CHLORAPREP W/TINT 26 (MISCELLANEOUS) ×1 IMPLANT
CORD BIPOLAR FORCEPS 12FT (ELECTRODE) ×1 IMPLANT
COVER SURGICAL LIGHT HANDLE (MISCELLANEOUS) ×1 IMPLANT
CUFF TOURN SGL QUICK 18X4 (TOURNIQUET CUFF) ×1 IMPLANT
CUFF TOURN SGL QUICK 24 (TOURNIQUET CUFF)
CUFF TRNQT CYL 24X4X16.5-23 (TOURNIQUET CUFF) IMPLANT
DRAPE SURG 17X23 STRL (DRAPES) ×1 IMPLANT
DRSG XEROFORM 1X8 (GAUZE/BANDAGES/DRESSINGS) IMPLANT
GAUZE PAD ABD 7.5X8 STRL (GAUZE/BANDAGES/DRESSINGS) IMPLANT
GAUZE PAD ABD 8X10 STRL (GAUZE/BANDAGES/DRESSINGS) ×2 IMPLANT
GAUZE SPONGE 4X4 12PLY STRL (GAUZE/BANDAGES/DRESSINGS) ×1 IMPLANT
GAUZE XEROFORM 1X8 LF (GAUZE/BANDAGES/DRESSINGS) ×1 IMPLANT
GLOVE BIO SURGEON STRL SZ7.5 (GLOVE) ×2 IMPLANT
GLOVE BIOGEL PI IND STRL 8 (GLOVE) ×1 IMPLANT
GOWN STRL REUS W/ TWL LRG LVL3 (GOWN DISPOSABLE) ×2 IMPLANT
GOWN STRL REUS W/TWL LRG LVL3 (GOWN DISPOSABLE) ×2
KIT BASIN OR (CUSTOM PROCEDURE TRAY) ×1 IMPLANT
KIT TURNOVER KIT B (KITS) ×1 IMPLANT
NDL HYPO 25GX1X1/2 BEV (NEEDLE) IMPLANT
NEEDLE HYPO 25GX1X1/2 BEV (NEEDLE) IMPLANT
NS IRRIG 1000ML POUR BTL (IV SOLUTION) ×1 IMPLANT
PACK ORTHO EXTREMITY (CUSTOM PROCEDURE TRAY) ×1 IMPLANT
PAD ARMBOARD 7.5X6 YLW CONV (MISCELLANEOUS) ×2 IMPLANT
PADDING CAST ABS COTTON 4X4 ST (CAST SUPPLIES) ×1 IMPLANT
SPONGE T-LAP 4X18 ~~LOC~~+RFID (SPONGE) ×1 IMPLANT
SUCTION TUBE FRAZIER 10FR DISP (SUCTIONS) IMPLANT
SUT ETHILON 3 0 PS 1 (SUTURE) ×1 IMPLANT
SUT ETHILON 4 0 PS 2 18 (SUTURE) IMPLANT
SUT VIC AB 3-0 SH 18 (SUTURE) ×1 IMPLANT
SYR CONTROL 10ML LL (SYRINGE) IMPLANT
TOWEL GREEN STERILE (TOWEL DISPOSABLE) ×1 IMPLANT
TOWEL GREEN STERILE FF (TOWEL DISPOSABLE) ×1 IMPLANT
TUBE CONNECTING 12X1/4 (SUCTIONS) IMPLANT
UNDERPAD 30X36 HEAVY ABSORB (UNDERPADS AND DIAPERS) ×1 IMPLANT
WATER STERILE IRR 1000ML POUR (IV SOLUTION) ×1 IMPLANT

## 2023-04-30 NOTE — Anesthesia Procedure Notes (Signed)
Procedure Name: MAC Date/Time: 04/30/2023 3:14 PM  Performed by: Garfield Cornea, CRNAPre-anesthesia Checklist: Patient identified, Emergency Drugs available, Suction available and Patient being monitored Patient Re-evaluated:Patient Re-evaluated prior to induction Oxygen Delivery Method: Simple face mask Dental Injury: Teeth and Oropharynx as per pre-operative assessment

## 2023-04-30 NOTE — Transfer of Care (Signed)
Immediate Anesthesia Transfer of Care Note  Patient: Bonnie Chavez  Procedure(s) Performed: RIGHT CARPAL TUNNEL RELEASE (Right: Hand)  Patient Location: PACU  Anesthesia Type:MAC  Level of Consciousness: awake, alert , and oriented  Airway & Oxygen Therapy: Patient Spontanous Breathing  Post-op Assessment: Report given to RN and Post -op Vital signs reviewed and stable  Post vital signs: Reviewed and stable  Last Vitals:  Vitals Value Taken Time  BP 125/63 04/30/23 1557  Temp    Pulse 54 04/30/23 1600  Resp 15 04/30/23 1600  SpO2 99 % 04/30/23 1600  Vitals shown include unvalidated device data.  Last Pain:  Vitals:   04/30/23 1243  TempSrc:   PainSc: 0-No pain         Complications: No notable events documented.

## 2023-04-30 NOTE — Discharge Instructions (Signed)

## 2023-04-30 NOTE — H&P (Signed)
Bonnie Chavez is an 89 y.o. female.   Chief Complaint: carpal tunnel syndrome HPI: 89 y.o. yo female with numbness and tingling right hand.  Positive nerve conduction studies. She wishes to have right carpal tunnel release.   Allergies:  Allergies  Allergen Reactions   Contrast Media [Iodinated Contrast Media] Other (See Comments)    Reaction unknown   Lisinopril Hives    Cough, hives, and lip edema    Nsaids Other (See Comments)    Abnormal renal function   Penicillins Rash and Other (See Comments)    Pt has taken Keflex without difficulty Has patient had a PCN reaction causing immediate rash, facial/tongue/throat swelling, SOB or lightheadedness with hypotension: yes Has patient had a PCN reaction causing severe rash involving mucus membranes or skin necrosis: no Has patient had a PCN reaction that required hospitalization no Has patient had a PCN reaction occurring within the last 10 years: no If all of the above answers are "NO", then may proceed with Cephalosporin use.    Past Medical History:  Diagnosis Date   Anemia    History of , resolved   Aortic valve sclerosis    Carpal tunnel syndrome    CHF (congestive heart failure) (HCC)    Chicken pox    Chronic low back pain 02/27/2016   -DDD -seeing [facility] [facility]    Diabetes mellitus    type 2, sees Dr. Talmage Nap   Goiter    Dr. Talmage Nap   Heart murmur    History of blood transfusion    auto transfusion post knee replacement   History of nuclear stress test 06/2010   dipyridamole; normal pattern of perfusion; ekg negative for ischemia; low risk scan    Hot flashes    Hyperlipidemia    Hyperparathyroidism (HCC) 02/27/2016   -sees endocrinologist, Dr. Talmage Nap, has seen surgeon as well    Hypertension    Migraine    no longer has migraines (80's) after menopause   Mitral valve prolapse    Neuropathy    Obesity 02/27/2016   Osteoarthritis    DDD, sees [place] [facility]   PMB (postmenopausal bleeding) 02/28/2009   Resolved    Pneumonia    history of    Renal insufficiency    seen at [place] /kidney   Vaginal atrophy    Vitamin D deficiency     Past Surgical History:  Procedure Laterality Date   CESAREAN SECTION  1972 &1976   CHOLECYSTECTOMY  1991   COLONOSCOPY     DILATATION & CURRETTAGE/HYSTEROSCOPY WITH RESECTOCOPE N/A 05/15/2013   Procedure: DILATATION & CURETTAGE/HYSTEROSCOPY WITH RESECTOCOPE;  Surgeon: Hal Morales, MD;  Location: [facility];  Service: Gynecology;  Laterality: N/A;   DILATATION & CURRETTAGE/HYSTEROSCOPY WITH RESECTOCOPE N/A 06/03/2017   Procedure: DILATATION & CURETTAGE/HYSTEROSCOPY;  Surgeon: Hal Morales, MD;  Location: [facility];  Service: Gynecology;  Laterality: N/A;   DILATION AND CURETTAGE OF UTERUS  1975   HEMORRHOID SURGERY  1997   KNEE SURGERY  2000 2011   right knee replacement    REPLACEMENT TOTAL KNEE  2000   left   SHOULDER SURGERY  2004 2010    TRANSTHORACIC ECHOCARDIOGRAM  07/2010   EF=>55%; LA mildly dilated; trace MR/TR;   TUBAL LIGATION  1976    Family History: Family History  Problem Relation Age of Onset   Hypertension Mother    Diabetes Mother    Hyperlipidemia Mother    Heart attack Mother    Heart disease Father  Heart attack Father    Stroke Brother    Heart disease Brother    Diabetes Brother    Uterine cancer Maternal Grandmother     Social History:   reports that she has never smoked. She has never used smokeless tobacco. She reports that she does not drink alcohol and does not use drugs.  Medications: Medications Prior to Admission  Medication Sig Dispense Refill   acetaminophen (TYLENOL) 500 MG tablet Take 1 tablet (500 mg total) by mouth every 4 (four) hours as needed for moderate pain. 60 tablet 0   amLODipine (NORVASC) 5 MG tablet Take 1 tablet (5 mg total) by mouth daily. 30 tablet 1   aspirin 81 MG EC tablet Take 81 mg by mouth at bedtime. Swallow whole.     BD PEN NEEDLE NANO 2ND GEN 32G X 4 MM MISC USE AS DIRECTED 90      cinacalcet (SENSIPAR) 30 MG tablet 30 mg as directed. PER PT TAKES 3 TIMES WEEKLY M, W, F     furosemide (LASIX) 20 MG tablet Take 40 mg by mouth daily. Can take additional tablet if needed 30 tablet    gabapentin (NEURONTIN) 100 MG capsule Take 100 mg by mouth at bedtime.     HUMULIN 70/30 KWIKPEN (70-30) 100 UNIT/ML KwikPen Inject 10 Units into the skin every evening. 4 units in the AM     levothyroxine (SYNTHROID) 50 MCG tablet Take 50 mcg by mouth daily before breakfast.     nebivolol (BYSTOLIC) 10 MG tablet TAKE 0.5 TABLETS BY MOUTH 2 TIMES DAILY. 90 tablet 3   ONETOUCH VERIO test strip 1 each 2 (two) times daily.     rosuvastatin (CRESTOR) 40 MG tablet TAKE 1 TABLET BY MOUTH EVERYDAY AT BEDTIME 90 tablet 1   cephALEXin (KEFLEX) 500 MG capsule Take 500-1,000 mg by mouth See admin instructions. TAKE ONLY FOR INVASIVE DENTAL PROCEDURES (Patient not taking: Reported on 02/05/2023)     OneTouch Delica Lancets 33G MISC Apply topically.      Results for orders placed or performed during the hospital encounter of 04/30/23 (from the past 48 hour(s))  Glucose, capillary     Status: Abnormal   Collection Time: 04/30/23 12:24 PM  Result Value Ref Range   Glucose-Capillary 182 (H) 70 - 99 mg/dL    Comment: Glucose reference range applies only to samples taken after fasting for at least 8 hours.   Comment 1 Notify RN    Comment 2 Document in Chart   CBC per protocol     Status: None   Collection Time: 04/30/23 12:43 PM  Result Value Ref Range   WBC 8.5 4.0 - 10.5 K/uL   RBC 4.00 3.87 - 5.11 MIL/uL   Hemoglobin 12.5 12.0 - 15.0 g/dL   HCT 09.8 11.9 - 14.7 %   MCV 95.5 80.0 - 100.0 fL   MCH 31.3 26.0 - 34.0 pg   MCHC 32.7 30.0 - 36.0 g/dL   RDW 82.9 56.2 - 13.0 %   Platelets 181 150 - 400 K/uL   nRBC 0.0 0.0 - 0.2 %    Comment: Performed at [facility], [address] [place], [place] [address]  Basic metabolic panel     Status: Abnormal   Collection Time: 04/30/23  1:33 PM   Result Value Ref Range   Sodium 137 135 - 145 mmol/L   Potassium 3.8 3.5 - 5.1 mmol/L   Chloride 104 98 - 111 mmol/L   CO2 23  22 - 32 mmol/L   Glucose, Bld 155 (H) 70 - 99 mg/dL    Comment: Glucose reference range applies only to samples taken after fasting for at least 8 hours.   BUN 27 (H) 8 - 23 mg/dL   Creatinine, Ser 1.61 (H) 0.44 - 1.00 mg/dL   Calcium 9.8 8.9 - 09.6 mg/dL   GFR, Estimated 37 (L) >60 mL/min    Comment: (NOTE) Calculated using the CKD-EPI Creatinine Equation (2021)    Anion gap 10 5 - 15    Comment: Performed at [facility], [address] [place], [place] [address]    No results found.    Blood pressure (!) 147/85, pulse 69, temperature 97.8 F (36.6 C), temperature source Oral, resp. rate 20, height 5\' 3"  (1.6 m), weight 87 kg, SpO2 99 %.  General appearance: alert, cooperative, and appears stated age Head: Normocephalic, without obvious abnormality, atraumatic Neck: supple, symmetrical, trachea midline Extremities: Intact sensation and capillary refill all digits.  +epl/fpl/io.  No wounds.  Pulses: 2+ and symmetric Skin: Skin color, texture, turgor normal. No rashes or lesions Neurologic: Grossly normal Incision/Wound: none  Assessment/Plan Right carpal tunnel syndrome.  Non operative and operative treatment options have been discussed with the patient and patient wishes to proceed with operative treatment. Risks, benefits and alternatives of surgery were discussed including risks of blood loss, infection, damage to nerves/vessels/tendons/ligament/bone, failure of surgery, need for additional surgery, complication with wound healing, stiffness, recurrence, damage to motor branch.  She voiced understanding of these risks and elected to proceed.     Betha Loa 04/30/2023, 2:29 PM

## 2023-04-30 NOTE — Anesthesia Preprocedure Evaluation (Addendum)
Anesthesia Evaluation  Patient identified by MRN, date of birth, ID band Patient awake    Reviewed: Allergy & Precautions, H&P , NPO status , Patient's Chart, lab work & pertinent test results, reviewed documented beta blocker date and time   Airway Mallampati: II  TM Distance: >3 FB Neck ROM: Full    Dental no notable dental hx. (+) Teeth Intact, Dental Advisory Given   Pulmonary neg pulmonary ROS   Pulmonary exam normal breath sounds clear to auscultation       Cardiovascular hypertension, Pt. on medications and Pt. on home beta blockers +CHF  + Valvular Problems/Murmurs AS  Rhythm:Regular Rate:Normal + Systolic murmurs    Neuro/Psych  Headaches  negative psych ROS   GI/Hepatic negative GI ROS, Neg liver ROS,,,  Endo/Other  diabetes    Renal/GU Renal disease  negative genitourinary   Musculoskeletal  (+) Arthritis , Osteoarthritis,    Abdominal   Peds  Hematology  (+) Blood dyscrasia, anemia   Anesthesia Other Findings   Reproductive/Obstetrics negative OB ROS                             Anesthesia Physical Anesthesia Plan  ASA: 4  Anesthesia Plan: MAC   Post-op Pain Management: Tylenol PO (pre-op)*   Induction: Intravenous  PONV Risk Score and Plan: 3 and Ondansetron, Dexamethasone and Propofol infusion  Airway Management Planned: Natural Airway and Simple Face Mask  Additional Equipment:   Intra-op Plan:   Post-operative Plan:   Informed Consent: I have reviewed the patients History and Physical, chart, labs and discussed the procedure including the risks, benefits and alternatives for the proposed anesthesia with the patient or authorized representative who has indicated his/her understanding and acceptance.     Dental advisory given  Plan Discussed with: CRNA  Anesthesia Plan Comments:        Anesthesia Quick Evaluation

## 2023-04-30 NOTE — Op Note (Signed)
04/30/2023 [facility] OR                              OPERATIVE REPORT   PREOPERATIVE DIAGNOSIS:  Right carpal tunnel syndrome.  POSTOPERATIVE DIAGNOSIS:  Right carpal tunnel syndrome.  PROCEDURE:  Right carpal tunnel release.  SURGEON:  Betha Loa, MD  ASSISTANT:  none.  ANESTHESIA: Local with sedation  IV FLUIDS:  Per anesthesia flow sheet.  ESTIMATED BLOOD LOSS:  Minimal.  COMPLICATIONS:  None.  SPECIMENS:  None.  TOURNIQUET TIME:    Total Tourniquet Time Documented: Forearm (Right) - 13 minutes Total: Forearm (Right) - 13 minutes   DISPOSITION:  Stable to PACU.  LOCATION: [facility] OR  INDICATIONS:  89 y.o. yo female with numbness and tingling right hand.  Positive nerve conduction studies. She wishes to proceed with right carpal tunnel release.  Risks, benefits and alternatives of surgery were discussed including the risk of blood loss; infection; damage to nerves, vessels, tendons, ligaments, bone; failure of surgery; need for additional surgery; complications with wound healing; continued pain; recurrence of carpal tunnel syndrome; and damage to motor branch. She voiced understanding of these risks and elected to proceed.   OPERATIVE COURSE:  After being identified preoperatively by myself, the patient and I agreed upon the procedure and site of procedure.  The surgical site was marked.  Surgical consent had been signed.  She was given preoperative IV antibiotic prophylaxis.  She was transferred to the operating room and placed on the operating room table in supine position with the Right upper extremity on an armboard.  Sedation was induced by the anesthesiologist.  Right upper extremity was prepped and draped in normal sterile orthopaedic fashion.  A surgical pause was performed between the surgeons, anesthesia, and operating room staff, and all were in agreement as to the patient, procedure, and site of procedure. The surgical site was injected with 9cc of half and half plain  lidocaine and plain marcaine. Tourniquet at the proximal aspect of the forearm was inflated to 250 mmHg after exsanguination of the arm with an Esmarch bandage  Incision was made over the transverse carpal ligament and carried into the subcutaneous tissues by spreading technique.  Bipolar electrocautery was used to obtain hemostasis.  The palmar fascia was sharply incised.  The transverse carpal ligament was identified.  The fascia distal to the ligament was opened.  Retractor was placed and the flexor tendons were identified.  The flexor tendon to the ring finger was identified and retracted radially.  The transverse carpal ligament was then incised from distal to proximal under direct visualization.  Scissors were used to split the distal aspect of the volar antebrachial fascia.  A finger was placed into the wound to ensure complete decompression, which was the case.  The nerve was examined.  It was flattened and hyperemic.  The motor branch was identified and was intact.  The wound was copiously irrigated with sterile saline.  It was then closed with 4-0 nylon in a horizontal mattress fashion.  It was injected with 0.25% plain Marcaine to aid in postoperative analgesia.  It was dressed with sterile Xeroform, 4x4s, an ABD, and wrapped with Kerlix and an Ace bandage.  Tourniquet was deflated at 13 minutes.  Fingertips were pink with brisk capillary refill after deflation of the tourniquet.  Operative drapes were broken down.  The patient was awoken from anesthesia safely.  She was transferred back to stretcher and taken to  the PACU in stable condition.  I will see her back in the office in 1 week for postoperative followup.  I will give her a prescription for Norco 5/325 1-2 tabs PO q6 hours prn pain, dispense # 15.    Betha Loa, MD Electronically signed, 04/30/23

## 2023-04-30 NOTE — Anesthesia Postprocedure Evaluation (Signed)
Anesthesia Post Note  Patient: Bonnie Chavez  Procedure(s) Performed: RIGHT CARPAL TUNNEL RELEASE (Right: Hand)     Patient location during evaluation: PACU Anesthesia Type: MAC Level of consciousness: awake and alert Pain management: pain level controlled Vital Signs Assessment: post-procedure vital signs reviewed and stable Respiratory status: spontaneous breathing, nonlabored ventilation and respiratory function stable Cardiovascular status: stable and blood pressure returned to baseline Postop Assessment: no apparent nausea or vomiting Anesthetic complications: no  No notable events documented.  Last Vitals:  Vitals:   04/30/23 1600 04/30/23 1615  BP: 125/63 (!) 109/56  Pulse: (!) 54 (!) 51  Resp: 15 17  Temp: (!) 36.4 C (!) 36.4 C  SpO2: 99% 97%    Last Pain:  Vitals:   04/30/23 1600  TempSrc:   PainSc: 0-No pain                 Shahil Speegle,W. EDMOND

## 2023-05-01 ENCOUNTER — Encounter (HOSPITAL_COMMUNITY): Payer: Self-pay | Admitting: Orthopedic Surgery

## 2023-05-14 ENCOUNTER — Ambulatory Visit: Payer: Medicare HMO | Attending: Student | Admitting: Student

## 2023-05-14 ENCOUNTER — Encounter: Payer: Self-pay | Admitting: Student

## 2023-05-14 VITALS — BP 138/86 | HR 57 | Ht 60.75 in | Wt 184.6 lb

## 2023-05-14 DIAGNOSIS — I350 Nonrheumatic aortic (valve) stenosis: Secondary | ICD-10-CM

## 2023-05-14 DIAGNOSIS — E1169 Type 2 diabetes mellitus with other specified complication: Secondary | ICD-10-CM | POA: Diagnosis not present

## 2023-05-14 DIAGNOSIS — I5032 Chronic diastolic (congestive) heart failure: Secondary | ICD-10-CM

## 2023-05-14 DIAGNOSIS — Z794 Long term (current) use of insulin: Secondary | ICD-10-CM

## 2023-05-14 DIAGNOSIS — I10 Essential (primary) hypertension: Secondary | ICD-10-CM | POA: Diagnosis not present

## 2023-05-14 DIAGNOSIS — E785 Hyperlipidemia, unspecified: Secondary | ICD-10-CM

## 2023-05-14 NOTE — Patient Instructions (Addendum)
Medication Instructions:  Your physician recommends that you continue on your current medications as directed. Please refer to the Current Medication list given to you today.  *If you need a refill on your cardiac medications before your next appointment, please call your pharmacy*   Lab Work: NONE If you have labs (blood work) drawn today and your tests are completely normal, you will receive your results only by: MyChart Message (if you have MyChart) OR A paper copy in the mail If you have any lab test that is abnormal or we need to change your treatment, we will call you to review the results.   Testing/Procedures: NONE   Follow-Up: At [facility], you and your health needs are our priority.  As part of our continuing mission to provide you with exceptional heart care, we have created designated Provider Care Teams.  These Care Teams include your primary Cardiologist (physician) and Advanced Practice Providers (APPs -  Physician Assistants and Nurse Practitioners) who all work together to provide you with the care you need, when you need it.  We recommend signing up for the patient portal called "MyChart".  Sign up information is provided on this After Visit Summary.  MyChart is used to connect with patients for Virtual Visits (Telemedicine).  Patients are able to view lab/test results, encounter notes, upcoming appointments, etc.  Non-urgent messages can be sent to your provider as well.   To learn more about what you can do with MyChart, go to [email or web address].    Your next appointment:     Provider:   Nicki Guadalajara, MD     THE PROVIDERS OF THE STRUCTURAL HEART TEAM ARE DR. MICHAEL COOPER AND DR. MCALHENY

## 2023-05-14 NOTE — Progress Notes (Signed)
Cardiology Clinic Note   Date: 05/14/2023 ID: Bonnie Chavez, DOB 1934-08-29, MRN [id number]  Primary Cardiologist:  Nicki Guadalajara, MD  Patient Profile    Bonnie Chavez is a 89 y.o. female who presents to the clinic today for follow up of cardiac testing.     Past medical history significant for: Chronic diastolic heart failure. Echo 03/24/2023: EF 60 to 65%.  Mild LVH.  Grade II DD.  Normal RV function.  Mild RAE.  Moderate MAC. Aortic valve stenosis. Echo 03/24/2023: Severe calcification of the aortic valve.  Severe aortic valve stenosis, mean gradient 36 mmHg.  Prior echo October 2023 showed moderate to severe aortic stenosis with mean gradient 24 mmHg. Hypertension. Hyperlipidemia. Lipid panel 01/05/2023: LDL 99, HDL 60, TG 77, total 173. CKD. T2DM.     History of Present Illness    Bonnie Chavez is a longtime patient of cardiology.  She is followed by Dr. Tresa Endo for the above outlined history.  Was last seen in the office by Dr. Tresa Endo on 11/30/2022 for follow-up.  She was doing well at that time with complaints of mild dyspnea with exertion.  [facility]  Today, patient is accompanied by her son.  She recently underwent carpal tunnel surgery and is doing well.  She is here to discuss recent echo to check on aortic valve stenosis. Patient denies shortness of breath at rest.  She does report some mild dyspnea with heavier exertion but not with routine activities. No chest pain, pressure, or tightness. Denies orthopnea or PND.  She has chronic lower extremity edema that she manages with daily Lasix.  She reports brisk diuresis on current dose.  She has not needed an extra dose of Lasix in quite some time.  No palpitations.  [facility]  Discussed results of echo with patient and son.  Patient expresses concern with patient's activity tolerance.  He feels over the past 6 months patient has  decreased tolerance to do her usual activities.  He states that she used to be [facility] [place] [facility] [employer] [facility] then run several errands before coming home.  Over the last 6 months she has run less and less errands after [facility] [employer].  Patient does admit that she has significantly less energy to do things around the house.  Patient's son inquires if she would be a candidate for TAVR.   ROS: All other systems reviewed and are otherwise negative except as noted in History of Present Illness.  Studies Reviewed       EKG is not ordered today.      Physical Exam    VS:  BP 138/86   Pulse (!) 57   Ht 5' 0.75" (1.543 m)   Wt 184 lb 9.6 oz (83.7 kg)   SpO2 96%   BMI 35.17 kg/m  , BMI Body mass index is 35.17 kg/m.  GEN: Well nourished, well developed, in no acute distress. Neck: No JVD or carotid bruits. Cardiac:  RRR. No murmurs. No rubs or gallops.   Respiratory:  Respirations regular and unlabored. Clear to auscultation without rales, wheezing or rhonchi. GI: Soft, nontender, nondistended. Extremities: Radials/DP/PT 2+ and equal bilaterally. No clubbing or cyanosis. No edema.  Skin: Warm and dry, no rash. Neuro: Strength intact.  Assessment & Plan    Chronic diastolic heart failure/aortic valve stenosis.  Echo May  2024 showed normal LV/RV function, Grade II DD, mild RAE, severe aortic stenosis mean gradient 36 mm (changed from prior echo October 2023 showing moderate to severe aortic stenosis with mean gradient 24 mmHg). Patient reports stable lower extremity edema managed with current dose of Lasix.  She has not needed an extra dose of Lasix in quite some time.  Patient denies orthopnea or PND.  She does report mild dyspnea with heavier exertion but not with routine activities.  Her biggest complaint is decreased activity tolerance over the last 6 months.  Patient and son both agree that patient is unable to do the activities she was doing previously.  Patient cites  the example of how [facility] [employer] [facility] then perform several errands before returning home and now she may only be able to do 1 errands before needing to come home.  Patient states she cannot do as much around the house and sometimes feels like she cannot get anything done.  Patient's son wonders if she is a candidate for TAVR. Euvolemic and well compensated on exam.  Given patient's level of activity (she attends the wife order weeks 2 times a week) and independence will refer to structural heart for consideration of TAVR.  Discussed with Dr. Tresa Endo who agrees with this plan.  Continue Lasix, Bystolic. Hypertension: BP today 138/86.  Patient denies headaches, dizziness or vision changes. Continue amlodipine, Bystolic. Hyperlipidemia.  LDL February 2024 99.  Continue rosuvastatin.  Disposition: Refer to structural heart for consideration of TAVR.  Keep previously scheduled visit with Dr. Tresa Endo in October or return sooner as needed.         Signed, Etta Grandchild. Mery Guadalupe, DNP, NP-C

## 2023-05-17 ENCOUNTER — Telehealth: Payer: Self-pay

## 2023-05-17 NOTE — Telephone Encounter (Signed)
Left message for pt to call back to arrange structural heart evaluation.

## 2023-06-01 ENCOUNTER — Encounter: Payer: Self-pay | Admitting: Cardiovascular Disease

## 2023-06-01 ENCOUNTER — Ambulatory Visit: Payer: Medicare HMO | Attending: Cardiovascular Disease | Admitting: Cardiovascular Disease

## 2023-06-01 VITALS — BP 120/70 | HR 82 | Ht 60.25 in | Wt 180.8 lb

## 2023-06-01 DIAGNOSIS — Z01810 Encounter for preprocedural cardiovascular examination: Secondary | ICD-10-CM | POA: Diagnosis not present

## 2023-06-01 DIAGNOSIS — I5032 Chronic diastolic (congestive) heart failure: Secondary | ICD-10-CM | POA: Diagnosis not present

## 2023-06-01 DIAGNOSIS — I350 Nonrheumatic aortic (valve) stenosis: Secondary | ICD-10-CM | POA: Diagnosis not present

## 2023-06-01 NOTE — Progress Notes (Signed)
Spoke with patient regarding contrast allergy on file while scheduling for LHC procedure. Patient states that she doesn't have an allergy;she had that placed on her chart because her nephrologist "told me to never get contrast." I specifically said, "so you've never actually had a reaction to contrast, correct?" She stated no. Her son and daughter-in-law were present also and agreed that they didn't know of any event with contrast being an issue. Will remove allergy from chart at this time.

## 2023-06-01 NOTE — Patient Instructions (Signed)
Medication Instructions:  Your physician recommends that you continue on your current medications as directed. Please refer to the Current Medication list given to you today.  *If you need a refill on your cardiac medications before your next appointment, please call your pharmacy*  Lab Work: BMET, CBC within 7 days of procedure If you have labs (blood work) drawn today and your tests are completely normal, you will receive your results only by: MyChart Message (if you have MyChart) OR A paper copy in the mail If you have any lab test that is abnormal or we need to change your treatment, we will call you to review the results.  Testing/Procedures: R & L heart catheterization Your physician has requested that you have a cardiac catheterization. Cardiac catheterization is used to diagnose and/or treat various heart conditions. Doctors may recommend this procedure for a number of different reasons. The most common reason is to evaluate chest pain. Chest pain can be a symptom of coronary artery disease (CAD), and cardiac catheterization can show whether plaque is narrowing or blocking your heart's arteries. This procedure is also used to evaluate the valves, as well as measure the blood flow and oxygen levels in different parts of your heart. For further information please visit [email or web address] Please follow instruction sheet, as given.  Follow-Up: At [facility], you and your health needs are our priority.  As part of our continuing mission to provide you with exceptional heart care, we have created designated Provider Care Teams.  These Care Teams include your primary Cardiologist (physician) and Advanced Practice Providers (APPs -  Physician Assistants and Nurse Practitioners) who all work together to provide you with the care you need, when you need it.  Your next appointment:   Structural Team will follow-up  Provider:   Tonny Bollman, MD     Other Instructions        Cardiac/Peripheral Catheterization   You are scheduled for a Cardiac Catheterization on Monday, September 23 with Dr. Tonny Bollman.  1. Please arrive at the [facility] (Main Entrance A) at [facility]: [address] [place], [place] [address] at 7:00 AM (This time is five hour(s) before your procedure to ensure your preparation). Free valet parking service is available. You will check in at ADMITTING. The support person will be asked to wait in the waiting room.  It is OK to have someone drop you off and come back when you are ready to be discharged.        Special note: Every effort is made to have your procedure done on time. Please understand that emergencies sometimes delay scheduled procedures.  2. Diet: Do not eat solid foods after midnight.  You may have clear liquids until 5 AM the day of the procedure.  3. Labs: You will need to have blood drawn within 5 days of procedure. You do not need to be fasting.  4. Medication instructions in preparation for your procedure:   Contrast Allergy: No  DO NOT TAKE Furosemide the morning of procedure REDUCE Humulin insulin by half the night prior to procedure (use only 5 units) and HOLD usual AM dose the day of procedure  On the morning of your procedure, take Aspirin 81 mg and any morning medicines NOT listed above.  You may use sips of water.  5. Plan to go home the same day, you will only stay overnight if medically necessary. 6. You MUST have a responsible adult to drive you home. 7. An adult  MUST be with you the first 24 hours after you arrive home. 8. Bring a current list of your medications, and the last time and date medication taken. 9. Bring ID and current insurance cards. 10.Please wear clothes that are easy to get on and off and wear slip-on shoes.  Thank you for allowing [place] to care for you!   -- [facility]

## 2023-06-01 NOTE — Progress Notes (Signed)
[facility]   MULTIDISCIPLINARY HEART VALVE TEAM  Date:  06/01/2023   ID:  Bonnie Chavez, DOB [birth year], MRN [id number]  PCP:  Deeann Saint, MD   Chief Complaint  Patient presents with   Aortic Stenosis     HISTORY OF PRESENT ILLNESS: Bonnie Chavez is a 89 y.o. female who presents for evaluation of severe aortic stenosis, referred by Dr Tresa Endo and Carlos Levering, NP.  Her cardiovascular problems are outlined below:  Chronic diastolic heart failure. Echo 03/24/2023: EF 60 to 65%.  Mild LVH.  Grade II DD.  Normal RV function.  Mild RAE.  Moderate MAC. Aortic valve stenosis. Echo 03/24/2023: Severe calcification of the aortic valve.  Severe aortic valve stenosis, mean gradient 36 mmHg.  Prior echo October 2023 showed moderate to severe aortic stenosis with mean gradient 24 mmHg. Hypertension. Hyperlipidemia. Lipid panel 01/05/2023: LDL 99, HDL 60, TG 77, total 173. CKD. T2DM.  The patient is here with her son and daughter-in-law today.  She reports slowing down significantly over the past year.  She is limited by fatigue and shortness of breath with activity.  She also reports some change in her ability to manage her house and stay on task.  She denies orthopnea, PND, or heart palpitations.  She has had no chest pain or pressure.  She denies syncope or near syncope.  She reports a longstanding "functional murmur."  The patient has had some limitations from osteoarthritis and she has had bilateral hip and bilateral shoulder replacements.  She reports no other major surgeries.  She remains functionally independent and lives within few minutes of her son and daughter-in-law who are very supportive. The patient has had regular dental care and reports no current problems. She is seen every 6 months.  Past Medical History:  Diagnosis Date   Anemia    History of , resolved   Aortic valve sclerosis    Carpal tunnel syndrome    CHF (congestive heart failure) (HCC)    Chicken pox     Chronic low back pain 02/27/2016   -DDD -seeing [facility] [facility]    Diabetes mellitus    type 2, sees Dr. Talmage Nap   Goiter    Dr. Talmage Nap   Heart murmur    History of blood transfusion    auto transfusion post knee replacement   History of nuclear stress test 06/2010   dipyridamole; normal pattern of perfusion; ekg negative for ischemia; low risk scan    Hot flashes    Hyperlipidemia    Hyperparathyroidism (HCC) 02/27/2016   -sees endocrinologist, Dr. Talmage Nap, has seen surgeon as well    Hypertension    Migraine    no longer has migraines (80's) after menopause   Mitral valve prolapse    Neuropathy    Obesity 02/27/2016   Osteoarthritis    DDD, sees [place] [facility]   PMB (postmenopausal bleeding) 02/28/2009   Resolved   Pneumonia    history of    Renal insufficiency    seen at [place] /kidney   Vaginal atrophy    Vitamin D deficiency     Current Outpatient Medications  Medication Sig Dispense Refill   amLODipine (NORVASC) 5 MG tablet Take 1 tablet (5 mg total) by mouth daily. 30 tablet 1   aspirin 81 MG EC tablet Take 81 mg by mouth at bedtime. Swallow whole.     BD PEN NEEDLE NANO 2ND GEN 32G X 4 MM MISC USE AS DIRECTED 90     cinacalcet (  SENSIPAR) 30 MG tablet 30 mg as directed. PER PT TAKES 3 TIMES WEEKLY M, W, F     furosemide (LASIX) 20 MG tablet Take 40 mg by mouth daily. Can take additional tablet if needed 30 tablet    gabapentin (NEURONTIN) 100 MG capsule Take 100 mg by mouth at bedtime.     HUMULIN 70/30 KWIKPEN (70-30) 100 UNIT/ML KwikPen Inject 10 Units into the skin every evening. 4 units in the AM     levothyroxine (SYNTHROID) 50 MCG tablet Take 50 mcg by mouth daily before breakfast.     nebivolol (BYSTOLIC) 10 MG tablet TAKE 0.5 TABLETS BY MOUTH 2 TIMES DAILY. (Patient taking differently: Take 10 mg by mouth. Per patient taking 10 mg in the morning and 5 mg at night.) 90 tablet 3   OneTouch Delica Lancets 33G MISC Apply topically.     ONETOUCH VERIO test  strip 1 each 2 (two) times daily.     rosuvastatin (CRESTOR) 40 MG tablet TAKE 1 TABLET BY MOUTH EVERYDAY AT BEDTIME 90 tablet 1   No current facility-administered medications for this visit.    ALLERGIES:   Contrast media [iodinated contrast media], Lisinopril, Nsaids, and Penicillins   SOCIAL HISTORY:  The patient  reports that she has never smoked. She has never used smokeless tobacco. She reports that she does not drink alcohol and does not use drugs.   FAMILY HISTORY:  The patient's family history includes Diabetes in her brother and mother; Heart attack in her father and mother; Heart disease in her brother and father; Hyperlipidemia in her mother; Hypertension in her mother; Stroke in her brother; Uterine cancer in her maternal grandmother.   REVIEW OF SYSTEMS: Negative except as outlined above.  All other systems are reviewed and negative.   PHYSICAL EXAM: VS:  BP 120/70   Pulse 82   Ht 5' 0.25" (1.53 m)   Wt 180 lb 12.8 oz (82 kg)   SpO2 99%   BMI 35.02 kg/m  , BMI Body mass index is 35.02 kg/m. GEN: Well nourished, well developed, in no acute distress HEENT: normal Neck: No JVD. carotids 2+ without bruits or masses Cardiac: The heart is RRR with 3/6 harsh crescendo decrescendo murmur, late peaking, A2 present, murmur most prominent at the right upper sternal border.  Trace edema in the left ankle, no edema on the right.  Pedal pulses 2+ = bilaterally  Respiratory:  clear to auscultation bilaterally GI: soft, nontender, nondistended, + BS MS: no deformity or atrophy Skin: warm and dry, no rash Neuro:  Strength and sensation are intact Psych: euthymic mood, full affect  EKG:  EKG from today reviewed and demonstrates normal sinus rhythm with frequent premature supraventricular complexes, first-degree AV block with PR interval 296 ms, no significant change from previous tracing.  RECENT LABS: 04/30/2023: BUN 27; Creatinine, Ser 1.39; Hemoglobin 12.5; Platelets 181; Potassium  3.8; Sodium 137  01/05/2023: Cholesterol 173; HDL 60; Triglycerides 77   CrCl cannot be calculated (Patient's most recent lab result is older than the maximum 21 days allowed.).   Wt Readings from Last 3 Encounters:  06/01/23 180 lb 12.8 oz (82 kg)  05/14/23 184 lb 9.6 oz (83.7 kg)  04/30/23 191 lb 12.8 oz (87 kg)     CARDIAC STUDIES: Echo:  1. Left ventricular ejection fraction, by estimation, is 60 to 65%. Left  ventricular ejection fraction by 3D volume is 62 %. The left ventricle has  normal function. The left ventricle has no regional wall  motion  abnormalities. There is mild left  ventricular hypertrophy. Left ventricular diastolic parameters are  consistent with Grade II diastolic dysfunction (pseudonormalization).  Elevated left atrial pressure.   2. Right ventricular systolic function is normal. The right ventricular  size is normal. There is normal pulmonary artery systolic pressure. The  estimated right ventricular systolic pressure is 26.0 mmHg.   3. Right atrial size was mildly dilated.   4. The mitral valve is degenerative. Trivial mitral valve regurgitation.  No evidence of mitral stenosis. Moderate mitral annular calcification.   5. The inferior vena cava is normal in size with greater than 50%  respiratory variability, suggesting right atrial pressure of 3 mmHg.   6. The aortic valve is calcified. There is severe calcifcation of the  aortic valve. Aortic valve regurgitation is not visualized. Severe aortic  valve stenosis. Vmax 4.0 m/s, MG , AVA 0.7 cm^2, DI 0.23   Cardiac Cath: Pending    ASSESSMENT AND PLAN: 89 year old woman, functionally independent, with severe, stage D1 aortic stenosis.  NYHA functional class II symptoms of chronic diastolic heart failure reported.  The patient's echo is personally reviewed and demonstrates normal LV systolic function with LVEF 65%, grade 2 diastolic dysfunction with pseudonormalization, normal RV function, moderate  MAC with no significant mitral valve dysfunction, and severe aortic stenosis.  The aortic valve is severely calcified and restricted.  Peak and mean transvalvular gradients are 53 and 32 mmHg, respectively.  The aortic valve area calculates to 0.7 cm.  Stroke-volume index is 34 and LVOT/aortic valve VTI ratio 0.23.  Echo findings are all consistent with severe aortic stenosis.  Prior echo studies are reviewed and the patient's most recent echo in October 2023 showed moderate aortic stenosis with a mean gradient of 24 mmHg.  The patient has had significant echo progression of her aortic stenosis over the past year.  This correlates with her worsening dyspnea and fatigue, now with New York Heart Association functional class II symptoms.  I have reviewed the natural history of aortic stenosis with the patient and their family members who are present today. We have discussed the limitations of medical therapy and the poor prognosis associated with symptomatic aortic stenosis. We have reviewed potential treatment options, including palliative medical therapy, conventional surgical aortic valve replacement, and transcatheter aortic valve replacement. We discussed treatment options in the context of the patient's specific comorbid medical conditions.  I demonstrated a TAVR procedural animation with them.  We reviewed the steps and discussed potential complications.  With progressive symptoms and progressive echo changes, it is reasonable to proceed with further evaluation.  The patient understands the indication to perform cardiac catheterization as well as CT angiography studies of the heart and the chest, abdomen, and pelvis.  I do not think the patient needs to undergo right heart catheterization as she has no evidence of pulmonary hypertension or RV dysfunction on echo. I have reviewed the risks, indications, and alternatives to cardiac catheterization, possible angioplasty, and stenting with the patient. Risks  include but are not limited to bleeding, infection, vascular injury, stroke, myocardial infection, arrhythmia, kidney injury, radiation-related injury in the case of prolonged fluoroscopy use, emergency cardiac surgery, and death. The patient understands the risks of serious complication is 1-2 in 1000 with diagnostic cardiac cath and 1-2% or less with angioplasty/stenting.  Once her cardiac catheterization and CTA studies are completed, she will be referred for formal cardiac surgical consultation as part of a multidisciplinary approach to her care.    Bonnie Chavez  Bonnie Chavez 06/01/2023 10:35 AM     [facility] [address] [place] [address]  [phone] (office) [phone] (fax)

## 2023-06-02 ENCOUNTER — Telehealth: Payer: Self-pay | Admitting: Cardiovascular Disease

## 2023-06-02 NOTE — Telephone Encounter (Signed)
Pt c/o medication issue:  1. Name of Medication: nebivolol (BYSTOLIC) 10 MG tablet   2. How are you currently taking this medication (dosage and times per day)?   TAKE 0.5 TABLETS BY MOUTH 2 TIMES DAILY.Patient taking differently: Take 10 mg by mouth. Per patient taking 10 mg in the morning and 5 mg at night.    3. Are you having a reaction (difficulty breathing--STAT)? no  4. What is your medication issue? Patient is calling with questions about her medication. She states that it was change but she want made aware of it. Please advise

## 2023-06-02 NOTE — Telephone Encounter (Signed)
Spoke to the pt, she is currently taken  Sig: TAKE 0.5 TABLETS BY MOUTH 2 TIMES DAILY.  Patient taking differently: Take 10 mg by mouth. Per patient taking 10 mg in the morning and 5 mg at night.    Pt stated she contacted the pharmacy to refill the medication, however, the next refill not util August. Pt stated she only has one weeks worth of medication. Pt stated she has an appointment with Dr. Excell Seltzer yesterday, she was very pleased. Will forward to MD and nurse.

## 2023-06-04 MED ORDER — NEBIVOLOL HCL 10 MG PO TABS: ORAL_TABLET | ORAL | 3 refills | Status: DC

## 2023-06-07 ENCOUNTER — Encounter: Payer: Self-pay | Admitting: Family Medicine

## 2023-06-07 ENCOUNTER — Ambulatory Visit: Payer: Medicare HMO | Admitting: Family Medicine

## 2023-06-07 VITALS — BP 128/62 | HR 64 | Temp 98.7°F | Wt 182.2 lb

## 2023-06-07 DIAGNOSIS — E1122 Type 2 diabetes mellitus with diabetic chronic kidney disease: Secondary | ICD-10-CM | POA: Diagnosis not present

## 2023-06-07 DIAGNOSIS — H65192 Other acute nonsuppurative otitis media, left ear: Secondary | ICD-10-CM

## 2023-06-07 DIAGNOSIS — Z794 Long term (current) use of insulin: Secondary | ICD-10-CM

## 2023-06-07 DIAGNOSIS — I350 Nonrheumatic aortic (valve) stenosis: Secondary | ICD-10-CM | POA: Diagnosis not present

## 2023-06-07 DIAGNOSIS — I10 Essential (primary) hypertension: Secondary | ICD-10-CM

## 2023-06-07 DIAGNOSIS — H6123 Impacted cerumen, bilateral: Secondary | ICD-10-CM | POA: Diagnosis not present

## 2023-06-07 DIAGNOSIS — N1832 Chronic kidney disease, stage 3b: Secondary | ICD-10-CM

## 2023-06-07 NOTE — Progress Notes (Signed)
Established Patient Office Visit   Subjective  Patient ID: Bonnie Chavez, female    DOB: May 11, 1934  Age: 89 y.o. MRN: [id number]  Chief Complaint  Patient presents with   Medical Management of Chronic Issues     DM and possibly check her ears    Patient is an 89 year old female seen for follow-up and acute concern.  Patient endorses bilateral ears feeling clogged.  Wears hearing aids.  Thinks ears need to be cleaned.  Patient recently seen by cardiology.  New echo shows worsening aortic stenosis.  Cath for valve replacement discussed.  Seen by endocrinology.  Patient notes elevation in blood sugars after surgery.  Recent A1c was 8.2%.  BP stable.    Past Medical History:  Diagnosis Date   Anemia    History of , resolved   Aortic valve sclerosis    Carpal tunnel syndrome    CHF (congestive heart failure) (HCC)    Chicken pox    Chronic low back pain 02/27/2016   -DDD -seeing [facility]    Diabetes mellitus    type 2, sees Dr. Talmage Nap   Goiter    Dr. Talmage Nap   Heart murmur    History of blood transfusion    auto transfusion post knee replacement   History of nuclear stress test 06/2010   dipyridamole; normal pattern of perfusion; ekg negative for ischemia; low risk scan    Hot flashes    Hyperlipidemia    Hyperparathyroidism (HCC) 02/27/2016   -sees endocrinologist, Dr. Talmage Nap, has seen surgeon as well    Hypertension    Migraine    no longer has migraines (80's) after menopause   Mitral valve prolapse    Neuropathy    Obesity 02/27/2016   Osteoarthritis    DDD, sees [place] [facility]   PMB (postmenopausal bleeding) 02/28/2009   Resolved   Pneumonia    history of    Renal insufficiency    seen at [place] /kidney   Vaginal atrophy    Vitamin D deficiency    Past Surgical History:  Procedure Laterality Date   CARPAL TUNNEL RELEASE Right 04/30/2023   Procedure: RIGHT CARPAL TUNNEL RELEASE;  Surgeon: Betha Loa, MD;  Location: [facility] OR;  Service: Orthopedics;   Laterality: Right;  30 MIN   CESAREAN SECTION  1972 &1976   CHOLECYSTECTOMY  1991   COLONOSCOPY     DILATATION & CURRETTAGE/HYSTEROSCOPY WITH RESECTOCOPE N/A 05/15/2013   Procedure: DILATATION & CURETTAGE/HYSTEROSCOPY WITH RESECTOCOPE;  Surgeon: Hal Morales, MD;  Location: [facility]S;  Service: Gynecology;  Laterality: N/A;   DILATATION & CURRETTAGE/HYSTEROSCOPY WITH RESECTOCOPE N/A 06/03/2017   Procedure: DILATATION & CURETTAGE/HYSTEROSCOPY;  Surgeon: Hal Morales, MD;  Location: [facility];  Service: Gynecology;  Laterality: N/A;   DILATION AND CURETTAGE OF UTERUS  1975   HEMORRHOID SURGERY  1997   KNEE SURGERY  2000 2011   right knee replacement    REPLACEMENT TOTAL KNEE  2000   left   SHOULDER SURGERY  2004 2010    TRANSTHORACIC ECHOCARDIOGRAM  07/2010   EF=>55%; LA mildly dilated; trace MR/TR;   TUBAL LIGATION  1976   Social History   Tobacco Use   Smoking status: Never   Smokeless tobacco: Never  Vaping Use   Vaping status: Never Used  Substance Use Topics   Alcohol use: No    Alcohol/week: 0.0 standard drinks of alcohol   Drug use: No   Family History  Problem Relation Age of Onset   Hypertension  Mother    Diabetes Mother    Hyperlipidemia Mother    Heart attack Mother    Heart disease Father    Heart attack Father    Stroke Brother    Heart disease Brother    Diabetes Brother    Uterine cancer Maternal Grandmother    Allergies  Allergen Reactions   Lisinopril Hives    Cough, hives, and lip edema    Nsaids Other (See Comments)    Abnormal renal function   Penicillins Rash and Other (See Comments)    Pt has taken Keflex without difficulty Has patient had a PCN reaction causing immediate rash, facial/tongue/throat swelling, SOB or lightheadedness with hypotension: yes Has patient had a PCN reaction causing severe rash involving mucus membranes or skin necrosis: no Has patient had a PCN reaction that required hospitalization no Has patient had a PCN  reaction occurring within the last 10 years: no If all of the above answers are "NO", then may proceed with Cephalosporin use.      ROS Negative unless stated above    Objective:     BP 128/62 (BP Location: Left Arm, Patient Position: Sitting, Cuff Size: Normal)   Pulse 64   Temp 98.7 F (37.1 C) (Oral)   Wt 182 lb 3.2 oz (82.6 kg)   SpO2 98%   BMI 35.29 kg/m    Physical Exam Constitutional:      General: She is not in acute distress.    Appearance: Normal appearance.  HENT:     Head: Normocephalic and atraumatic.     Right Ear: Decreased hearing noted. There is impacted cerumen.     Left Ear: Decreased hearing noted. There is impacted cerumen.     Ears:     Comments: Wearing bilateral hearing aids.  Right TM normal after irrigation.  Left TM with effusion.  No erythema, fullness.    Nose: Nose normal.     Mouth/Throat:     Mouth: Mucous membranes are moist.  Cardiovascular:     Rate and Rhythm: Normal rate and regular rhythm.     Heart sounds: Murmur heard.     No gallop.     Comments: Best heard at right upper chest Pulmonary:     Effort: Pulmonary effort is normal. No respiratory distress.     Breath sounds: Normal breath sounds. No wheezing, rhonchi or rales.  Skin:    General: Skin is warm and dry.  Neurological:     Mental Status: She is alert and oriented to person, place, and time.     No results found for any visits on 06/07/23.    Assessment & Plan:  Bilateral impacted cerumen  Aortic valve stenosis, etiology of cardiac valve disease unspecified  Type 2 diabetes mellitus with stage 3b chronic kidney disease, with long-term current use of insulin (HCC)  Acute effusion of left ear  Essential hypertension  Consent obtained.  Bilateral cerumen impaction irrigated.  Patient tolerated procedure well.  Left TM with effusion seen after irrigation.  Recent echo with severe aortic stenosis, EF stable at 60-65%, mild LVH, grade 2 DD, mild RAE, moderate  MAC.  Patient considering valvuloplasty.  Continue to monitor blood sugar.  Foot exam with podiatry.  Eye exam done 02/01/2023.  Return in about 3 months (around 09/07/2023).   Deeann Saint, MD

## 2023-07-05 ENCOUNTER — Ambulatory Visit: Payer: Medicare HMO | Admitting: Podiatry

## 2023-07-05 DIAGNOSIS — B351 Tinea unguium: Secondary | ICD-10-CM | POA: Diagnosis not present

## 2023-07-05 DIAGNOSIS — E1149 Type 2 diabetes mellitus with other diabetic neurological complication: Secondary | ICD-10-CM | POA: Diagnosis not present

## 2023-07-05 DIAGNOSIS — M79675 Pain in left toe(s): Secondary | ICD-10-CM | POA: Diagnosis not present

## 2023-07-05 DIAGNOSIS — M79674 Pain in right toe(s): Secondary | ICD-10-CM

## 2023-07-05 NOTE — Progress Notes (Signed)
Subjective: Chief Complaint  Patient presents with   Diabetes    DFC BS - 107 A1C - 28.52     89 year old female presents for above concerns.  She presents today for thick, elongated nails that she is unable to trim herself.  No new concerns.   Objective: AAO x3, NAD DP/PT pulses palpable bilaterally, CRT less than 3 seconds Nails continue be hypertrophic, dystrophic with yellow, brown discoloration.  There is no edema no erythema.  Tenderness nails 1-5 bilaterally. Hammertoes present.  Callus on medial left 2nd toe from where the 2nd toe and hallux are rubbing.  Bunion, hammertoes present.  Flatfoot present. No pain with calf compression, swelling, warmth, erythema  Assessment: Symptomatic onychomycosis  Plan: -All treatment options discussed with the patient including all alternatives, risks, complications.  -Sharply debrided the nails x 10 without any complications or bleeding.  -Daily foot inspection, glucose control.  Vivi Barrack DPM

## 2023-08-09 ENCOUNTER — Ambulatory Visit: Payer: Medicare HMO | Attending: Internal Medicine

## 2023-08-09 DIAGNOSIS — I350 Nonrheumatic aortic (valve) stenosis: Secondary | ICD-10-CM

## 2023-08-09 DIAGNOSIS — Z01810 Encounter for preprocedural cardiovascular examination: Secondary | ICD-10-CM

## 2023-08-09 DIAGNOSIS — I5032 Chronic diastolic (congestive) heart failure: Secondary | ICD-10-CM

## 2023-08-10 LAB — BASIC METABOLIC PANEL
BUN/Creatinine Ratio: 23 (ref 12–28)
BUN: 37 mg/dL — ABNORMAL HIGH (ref 8–27)
CO2: 25 mmol/L (ref 20–29)
Calcium: 10.2 mg/dL (ref 8.7–10.3)
Chloride: 102 mmol/L (ref 96–106)
Creatinine, Ser: 1.61 mg/dL — ABNORMAL HIGH (ref 0.57–1.00)
Glucose: 164 mg/dL — ABNORMAL HIGH (ref 70–99)
Potassium: 4.4 mmol/L (ref 3.5–5.2)
Sodium: 139 mmol/L (ref 134–144)
eGFR: 31 mL/min/{1.73_m2} — ABNORMAL LOW (ref >59)

## 2023-08-10 LAB — CBC
Hematocrit: 36.2 % (ref 34.0–46.6)
Hemoglobin: 11.5 g/dL (ref 11.1–15.9)
MCH: 30.9 pg (ref 26.6–33.0)
MCHC: 31.8 g/dL (ref 31.5–35.7)
MCV: 97 fL (ref 79–97)
Platelets: 174 10*3/uL (ref 150–450)
RBC: 3.72 x10E6/uL — ABNORMAL LOW (ref 3.77–5.28)
RDW: 12.6 % (ref 11.7–15.4)
WBC: 8.3 10*3/uL (ref 3.4–10.8)

## 2023-08-11 ENCOUNTER — Telehealth: Payer: Self-pay | Admitting: *Deleted

## 2023-08-11 NOTE — Telephone Encounter (Addendum)
Cardiac Catheterization scheduled at [facility] for: Monday August 16, 2023 12 Noon Arrival time [facility] Main Entrance A at: 7 AM-pre-procedure hydration  Nothing to eat after midnight prior to procedure, clear liquids until 5 AM day of procedure.  CONTRAST ALLERGY: see note about contrast allergy by Maralyn Sago, RN attached to Dr Excell Seltzer 06/01/23 Office Note.  Medication instructions: -Hold:  Lasix -day before and day of procedure-See 08/09/23 BMP results-per protocol.  Insulin-AM of procedure/1/2 usual HS Insulin dose  -Other usual morning medications can be taken with sips of water including aspirin 81 mg.  Plan to go home the same day, you will only stay overnight if medically necessary.  You must have responsible adult to drive you home.  Someone must be with you the first 24 hours after you arrive home.  Reviewed procedure instructions, pre-procedure hydration with patient.

## 2023-08-11 NOTE — Telephone Encounter (Addendum)
Patient asked about taking prophylactic antibiotic prior to cardiac cath. Pt advised cardiac cath is sterile procedure, so antibiotics are not prescribed prior to cardiac cath. Patient asked me to double check with Dr Excell Seltzer to make sure he did not recommend prophylactic antibiotic prior to cardiac cath I will forward to Dr Excell Seltzer for his review.

## 2023-08-12 NOTE — Telephone Encounter (Addendum)
Patient advised per Dr Excell Seltzer, no antibiotics prior to cath.

## 2023-08-13 ENCOUNTER — Other Ambulatory Visit: Payer: Self-pay | Admitting: Cardiovascular Disease

## 2023-08-16 ENCOUNTER — Encounter: Payer: Self-pay | Admitting: Physician Assistant

## 2023-08-16 ENCOUNTER — Other Ambulatory Visit: Payer: Self-pay

## 2023-08-16 ENCOUNTER — Encounter (HOSPITAL_COMMUNITY)
Admission: RE | Disposition: A | Discharge status: Home / Self Care | Payer: Self-pay | Source: Home / Self Care | Attending: Cardiovascular Disease

## 2023-08-16 ENCOUNTER — Ambulatory Visit (HOSPITAL_COMMUNITY)
Admission: RE | Admit: 2023-08-16 | Discharge: 2023-08-16 | Disposition: A | Discharge status: Home / Self Care | Payer: Medicare HMO | Attending: Cardiovascular Disease | Admitting: Cardiovascular Disease

## 2023-08-16 DIAGNOSIS — N189 Chronic kidney disease, unspecified: Secondary | ICD-10-CM | POA: Diagnosis not present

## 2023-08-16 DIAGNOSIS — Z794 Long term (current) use of insulin: Secondary | ICD-10-CM | POA: Insufficient documentation

## 2023-08-16 DIAGNOSIS — E785 Hyperlipidemia, unspecified: Secondary | ICD-10-CM | POA: Diagnosis not present

## 2023-08-16 DIAGNOSIS — I5032 Chronic diastolic (congestive) heart failure: Secondary | ICD-10-CM | POA: Insufficient documentation

## 2023-08-16 DIAGNOSIS — I2584 Coronary atherosclerosis due to calcified coronary lesion: Secondary | ICD-10-CM | POA: Diagnosis not present

## 2023-08-16 DIAGNOSIS — E1122 Type 2 diabetes mellitus with diabetic chronic kidney disease: Secondary | ICD-10-CM | POA: Insufficient documentation

## 2023-08-16 DIAGNOSIS — I2510 Atherosclerotic heart disease of native coronary artery without angina pectoris: Secondary | ICD-10-CM | POA: Insufficient documentation

## 2023-08-16 DIAGNOSIS — I350 Nonrheumatic aortic (valve) stenosis: Secondary | ICD-10-CM | POA: Diagnosis present

## 2023-08-16 DIAGNOSIS — I130 Hypertensive heart and chronic kidney disease with heart failure and stage 1 through stage 4 chronic kidney disease, or unspecified chronic kidney disease: Secondary | ICD-10-CM | POA: Insufficient documentation

## 2023-08-16 HISTORY — PX: CORONARY ANGIOGRAPHY: CATH118303

## 2023-08-16 SURGERY — CORONARY ANGIOGRAPHY (CATH LAB)
Anesthesia: LOCAL

## 2023-08-16 MED ORDER — MIDAZOLAM HCL 2 MG/2ML IJ SOLN
INTRAMUSCULAR | Status: AC
  Filled 2023-08-16: qty 2

## 2023-08-16 MED ORDER — MIDAZOLAM HCL 2 MG/2ML IJ SOLN
INTRAMUSCULAR | Status: DC | PRN
  Administered 2023-08-16: 1 mg via INTRAVENOUS

## 2023-08-16 MED ORDER — LIDOCAINE HCL (PF) 1 % IJ SOLN
INTRAMUSCULAR | Status: DC | PRN
  Administered 2023-08-16: 2 mL

## 2023-08-16 MED ORDER — HYDRALAZINE HCL 20 MG/ML IJ SOLN: 10.0000 mg | INTRAMUSCULAR | Status: DC | PRN

## 2023-08-16 MED ORDER — SODIUM CHLORIDE 0.9% FLUSH: 3.0000 mL | INTRAVENOUS | Status: DC | PRN

## 2023-08-16 MED ORDER — SODIUM CHLORIDE 0.9 % IV SOLN: 250.0000 mL | INTRAVENOUS | Status: DC | PRN

## 2023-08-16 MED ORDER — HEPARIN (PORCINE) IN NACL 1000-0.9 UT/500ML-% IV SOLN
INTRAVENOUS | Status: DC | PRN
  Administered 2023-08-16 (×2): 500 mL

## 2023-08-16 MED ORDER — ACETAMINOPHEN 325 MG PO TABS: 650.0000 mg | ORAL_TABLET | ORAL | Status: DC | PRN

## 2023-08-16 MED ORDER — VERAPAMIL HCL 2.5 MG/ML IV SOLN
INTRAVENOUS | Status: AC
  Filled 2023-08-16: qty 2

## 2023-08-16 MED ORDER — SODIUM CHLORIDE 0.9 % WEIGHT BASED INFUSION: 1.0000 mL/kg/h | INTRAVENOUS | Status: DC

## 2023-08-16 MED ORDER — ONDANSETRON HCL 4 MG/2ML IJ SOLN: 4.0000 mg | Freq: Four times a day (QID) | INTRAMUSCULAR | Status: DC | PRN

## 2023-08-16 MED ORDER — FENTANYL CITRATE (PF) 100 MCG/2ML IJ SOLN
INTRAMUSCULAR | Status: DC | PRN
  Administered 2023-08-16: 25 ug via INTRAVENOUS

## 2023-08-16 MED ORDER — VERAPAMIL HCL 2.5 MG/ML IV SOLN
INTRAVENOUS | Status: DC | PRN
  Administered 2023-08-16: 10 mL via INTRA_ARTERIAL

## 2023-08-16 MED ORDER — SODIUM CHLORIDE 0.9% FLUSH: 3.0000 mL | Freq: Two times a day (BID) | INTRAVENOUS | Status: DC

## 2023-08-16 MED ORDER — LIDOCAINE HCL (PF) 1 % IJ SOLN
INTRAMUSCULAR | Status: AC
  Filled 2023-08-16: qty 30

## 2023-08-16 MED ORDER — LABETALOL HCL 5 MG/ML IV SOLN: 10.0000 mg | INTRAVENOUS | Status: DC | PRN

## 2023-08-16 MED ORDER — SODIUM CHLORIDE 0.9 % WEIGHT BASED INFUSION
3.0000 mL/kg/h | INTRAVENOUS | Status: DC
  Administered 2023-08-16: 3 mL/kg/h via INTRAVENOUS

## 2023-08-16 MED ORDER — IOHEXOL 350 MG/ML SOLN
INTRAVENOUS | Status: DC | PRN
  Administered 2023-08-16: 30 mL

## 2023-08-16 MED ORDER — SODIUM CHLORIDE 0.9 % WEIGHT BASED INFUSION: 3.0000 mL/kg/h | INTRAVENOUS | Status: AC

## 2023-08-16 MED ORDER — HEPARIN SODIUM (PORCINE) 1000 UNIT/ML IJ SOLN
INTRAMUSCULAR | Status: DC | PRN
  Administered 2023-08-16: 5000 [IU] via INTRAVENOUS

## 2023-08-16 MED ORDER — HEPARIN SODIUM (PORCINE) 1000 UNIT/ML IJ SOLN
INTRAMUSCULAR | Status: AC
  Filled 2023-08-16: qty 10

## 2023-08-16 MED ORDER — ASPIRIN 81 MG PO CHEW: 81.0000 mg | CHEWABLE_TABLET | ORAL | Status: DC

## 2023-08-16 MED ORDER — FENTANYL CITRATE (PF) 100 MCG/2ML IJ SOLN
INTRAMUSCULAR | Status: AC
  Filled 2023-08-16: qty 2

## 2023-08-16 SURGICAL SUPPLY — 10 items
CATH 5FR JL3.5 JR4 ANG PIG MP (CATHETERS) IMPLANT
DEVICE RAD COMP TR BAND LRG (VASCULAR PRODUCTS) IMPLANT
GLIDESHEATH SLEND SS 6F .021 (SHEATH) IMPLANT
GUIDEWIRE INQWIRE 1.5J.035X260 (WIRE) IMPLANT
INQWIRE 1.5J .035X260CM (WIRE) ×1
PACK CARDIAC CATHETERIZATION (CUSTOM PROCEDURE TRAY) ×1 IMPLANT
PROTECTION STATION PRESSURIZED (MISCELLANEOUS) ×1
SET ATX-X65L (MISCELLANEOUS) IMPLANT
STATION PROTECTION PRESSURIZED (MISCELLANEOUS) IMPLANT
WIRE MICROINTRODUCER 60CM (WIRE) IMPLANT

## 2023-08-16 NOTE — H&P (Signed)
Expand All Collapse All  [facility]   MULTIDISCIPLINARY HEART VALVE TEAM   Date:  06/01/2023    ID:  Bonnie Chavez, DOB 11/26/1933, MRN [id number]   PCP:  Deeann Saint, MD               Chief Complaint  Patient presents with   Aortic Stenosis      HISTORY OF PRESENT ILLNESS: Bonnie Chavez is a 89 y.o. female who presents for evaluation of severe aortic stenosis, referred by Dr Tresa Endo and Carlos Levering, NP.  Her cardiovascular problems are outlined below:   Chronic diastolic heart failure. Echo 03/24/2023: EF 60 to 65%.  Mild LVH.  Grade II DD.  Normal RV function.  Mild RAE.  Moderate MAC. Aortic valve stenosis. Echo 03/24/2023: Severe calcification of the aortic valve.  Severe aortic valve stenosis, mean gradient 36 mmHg.  Prior echo October 2023 showed moderate to severe aortic stenosis with mean gradient 24 mmHg. Hypertension. Hyperlipidemia. Lipid panel 01/05/2023: LDL 99, HDL 60, TG 77, total 173. CKD. T2DM.   The patient is here with her son and daughter-in-law today.  She reports slowing down significantly over the past year.  She is limited by fatigue and shortness of breath with activity.  She also reports some change in her ability to manage her house and stay on task.  She denies orthopnea, PND, or heart palpitations.  She has had no chest pain or pressure.  She denies syncope or near syncope.  She reports a longstanding "functional murmur."  The patient has had some limitations from osteoarthritis and she has had bilateral hip and bilateral shoulder replacements.  She reports no other major surgeries.  She remains functionally independent and lives within few minutes of her son and daughter-in-law who are very supportive. The patient has had regular dental care and reports no current problems. She is seen every 6 months.       Past Medical History:  Diagnosis Date   Anemia      History of , resolved   Aortic valve sclerosis     Carpal tunnel syndrome      CHF (congestive heart failure) (HCC)     Chicken pox     Chronic low back pain 02/27/2016    -DDD -seeing [facility]    Diabetes mellitus      type 2, sees Dr. Talmage Nap   Goiter      Dr. Talmage Nap   Heart murmur     History of blood transfusion      auto transfusion post knee replacement   History of nuclear stress test 06/2010    dipyridamole; normal pattern of perfusion; ekg negative for ischemia; low risk scan    Hot flashes     Hyperlipidemia     Hyperparathyroidism (HCC) 02/27/2016    -sees endocrinologist, Dr. Talmage Nap, has seen surgeon as well    Hypertension     Migraine      no longer has migraines (80's) after menopause   Mitral valve prolapse     Neuropathy     Obesity 02/27/2016   Osteoarthritis      DDD, sees [facility] [facility]   PMB (postmenopausal bleeding) 02/28/2009    Resolved   Pneumonia      history of    Renal insufficiency      seen at [place] /kidney   Vaginal atrophy     Vitamin D deficiency  Current Outpatient Medications  Medication Sig Dispense Refill   amLODipine (NORVASC) 5 MG tablet Take 1 tablet (5 mg total) by mouth daily. 30 tablet 1   aspirin 81 MG EC tablet Take 81 mg by mouth at bedtime. Swallow whole.       BD PEN NEEDLE NANO 2ND GEN 32G X 4 MM MISC USE AS DIRECTED 90       cinacalcet (SENSIPAR) 30 MG tablet 30 mg as directed. PER PT TAKES 3 TIMES WEEKLY M, W, F       furosemide (LASIX) 20 MG tablet Take 40 mg by mouth daily. Can take additional tablet if needed 30 tablet     gabapentin (NEURONTIN) 100 MG capsule Take 100 mg by mouth at bedtime.       HUMULIN 70/30 KWIKPEN (70-30) 100 UNIT/ML KwikPen Inject 10 Units into the skin every evening. 4 units in the AM       levothyroxine (SYNTHROID) 50 MCG tablet Take 50 mcg by mouth daily before breakfast.       nebivolol (BYSTOLIC) 10 MG tablet TAKE 0.5 TABLETS BY MOUTH 2 TIMES DAILY. (Patient taking differently: Take 10 mg by mouth. Per patient taking 10 mg in the morning  and 5 mg at night.) 90 tablet 3   OneTouch Delica Lancets 33G MISC Apply topically.       ONETOUCH VERIO test strip 1 each 2 (two) times daily.       rosuvastatin (CRESTOR) 40 MG tablet TAKE 1 TABLET BY MOUTH EVERYDAY AT BEDTIME 90 tablet 1      No current facility-administered medications for this visit.        ALLERGIES:   Contrast media [iodinated contrast media], Lisinopril, Nsaids, and Penicillins    SOCIAL HISTORY:  The patient  reports that she has never smoked. She has never used smokeless tobacco. She reports that she does not drink alcohol and does not use drugs.    FAMILY HISTORY:  The patient's family history includes Diabetes in her brother and mother; Heart attack in her father and mother; Heart disease in her brother and father; Hyperlipidemia in her mother; Hypertension in her mother; Stroke in her brother; Uterine cancer in her maternal grandmother.    REVIEW OF SYSTEMS: Negative except as outlined above.  All other systems are reviewed and negative.    PHYSICAL EXAM: VS:  BP 120/70   Pulse 82   Ht 5' 0.25" (1.53 m)   Wt 180 lb 12.8 oz (82 kg)   SpO2 99%   BMI 35.02 kg/m  , BMI Body mass index is 35.02 kg/m. GEN: Well nourished, well developed, in no acute distress HEENT: normal Neck: No JVD. carotids 2+ without bruits or masses Cardiac: The heart is RRR with 3/6 harsh crescendo decrescendo murmur, late peaking, A2 present, murmur most prominent at the right upper sternal border.  Trace edema in the left ankle, no edema on the right.  Pedal pulses 2+ = bilaterally  Respiratory:  clear to auscultation bilaterally GI: soft, nontender, nondistended, + BS MS: no deformity or atrophy Skin: warm and dry, no rash Neuro:  Strength and sensation are intact Psych: euthymic mood, full affect   EKG:  EKG from today reviewed and demonstrates normal sinus rhythm with frequent premature supraventricular complexes, first-degree AV block with PR interval 296 ms, no significant  change from previous tracing.   RECENT LABS: 04/30/2023: BUN 27; Creatinine, Ser 1.39; Hemoglobin 12.5; Platelets 181; Potassium 3.8; Sodium 137  01/05/2023: Cholesterol 173; HDL 60;  Triglycerides 77    CrCl cannot be calculated (Patient's most recent lab result is older than the maximum 21 days allowed.).       Wt Readings from Last 3 Encounters:  06/01/23 180 lb 12.8 oz (82 kg)  05/14/23 184 lb 9.6 oz (83.7 kg)  04/30/23 191 lb 12.8 oz (87 kg)      CARDIAC STUDIES: Echo:  1. Left ventricular ejection fraction, by estimation, is 60 to 65%. Left  ventricular ejection fraction by 3D volume is 62 %. The left ventricle has  normal function. The left ventricle has no regional wall motion  abnormalities. There is mild left  ventricular hypertrophy. Left ventricular diastolic parameters are  consistent with Grade II diastolic dysfunction (pseudonormalization).  Elevated left atrial pressure.   2. Right ventricular systolic function is normal. The right ventricular  size is normal. There is normal pulmonary artery systolic pressure. The  estimated right ventricular systolic pressure is 26.0 mmHg.   3. Right atrial size was mildly dilated.   4. The mitral valve is degenerative. Trivial mitral valve regurgitation.  No evidence of mitral stenosis. Moderate mitral annular calcification.   5. The inferior vena cava is normal in size with greater than 50%  respiratory variability, suggesting right atrial pressure of 3 mmHg.   6. The aortic valve is calcified. There is severe calcifcation of the  aortic valve. Aortic valve regurgitation is not visualized. Severe aortic  valve stenosis. Vmax 4.0 m/s, MG , AVA 0.7 cm^2, DI 0.23    Cardiac Cath: Pending       ASSESSMENT AND PLAN: 89 year old woman, functionally independent, with severe, stage D1 aortic stenosis.  NYHA functional class II symptoms of chronic diastolic heart failure reported.   The patient's echo is personally reviewed and  demonstrates normal LV systolic function with LVEF 65%, grade 2 diastolic dysfunction with pseudonormalization, normal RV function, moderate MAC with no significant mitral valve dysfunction, and severe aortic stenosis.  The aortic valve is severely calcified and restricted.  Peak and mean transvalvular gradients are 53 and 32 mmHg, respectively.  The aortic valve area calculates to 0.7 cm.  Stroke-volume index is 34 and LVOT/aortic valve VTI ratio 0.23.  Echo findings are all consistent with severe aortic stenosis.  Prior echo studies are reviewed and the patient's most recent echo in October 2023 showed moderate aortic stenosis with a mean gradient of 24 mmHg.  The patient has had significant echo progression of her aortic stenosis over the past year.  This correlates with her worsening dyspnea and fatigue, now with New York Heart Association functional class II symptoms.   I have reviewed the natural history of aortic stenosis with the patient and their family members who are present today. We have discussed the limitations of medical therapy and the poor prognosis associated with symptomatic aortic stenosis. We have reviewed potential treatment options, including palliative medical therapy, conventional surgical aortic valve replacement, and transcatheter aortic valve replacement. We discussed treatment options in the context of the patient's specific comorbid medical conditions.  I demonstrated a TAVR procedural animation with them.  We reviewed the steps and discussed potential complications.  With progressive symptoms and progressive echo changes, it is reasonable to proceed with further evaluation.  The patient understands the indication to perform cardiac catheterization as well as CT angiography studies of the heart and the chest, abdomen, and pelvis.  I do not think the patient needs to undergo right heart catheterization as she has no evidence of pulmonary hypertension or  RV dysfunction on echo. I have  reviewed the risks, indications, and alternatives to cardiac catheterization, possible angioplasty, and stenting with the patient. Risks include but are not limited to bleeding, infection, vascular injury, stroke, myocardial infection, arrhythmia, kidney injury, radiation-related injury in the case of prolonged fluoroscopy use, emergency cardiac surgery, and death. The patient understands the risks of serious complication is 1-2 in 1000 with diagnostic cardiac cath and 1-2% or less with angioplasty/stenting.  Once her cardiac catheterization and CTA studies are completed, she will be referred for formal cardiac surgical consultation as part of a multidisciplinary approach to her care.       Bonnie Chavez 06/01/2023 10:35 AM     [facility] [address] [place] [address]     ADDENDUM: Patient independently evaluated prior to the procedure today.  She reports relatively stable symptoms, possibly was not slight increase in her shortness of breath with activity since I saw her last.  No chest pain or pressure.  No lightheadedness or syncope.  Continues to have fatigue and "lack of motivation to do things."  Plan to proceed with right and left heart catheterization as part of a preoperative evaluation for TAVR for treatment of severe symptomatic aortic stenosis.  Otherwise as outlined above.  No other pertinent changes to report.  Bonnie Chavez 08/16/2023 10:23 AM

## 2023-08-17 ENCOUNTER — Encounter (HOSPITAL_COMMUNITY): Payer: Self-pay | Admitting: Cardiovascular Disease

## 2023-08-26 ENCOUNTER — Other Ambulatory Visit: Payer: Self-pay | Admitting: Cardiology

## 2023-08-26 DIAGNOSIS — I358 Other nonrheumatic aortic valve disorders: Secondary | ICD-10-CM

## 2023-08-30 ENCOUNTER — Ambulatory Visit (HOSPITAL_COMMUNITY)
Admission: RE | Admit: 2023-08-30 | Discharge: 2023-08-30 | Disposition: A | Discharge status: Home / Self Care | Payer: Medicare HMO | Source: Ambulatory Visit | Attending: Family Medicine | Admitting: Family Medicine

## 2023-08-30 ENCOUNTER — Other Ambulatory Visit: Payer: Self-pay

## 2023-08-30 ENCOUNTER — Ambulatory Visit (HOSPITAL_COMMUNITY)
Admit: 2023-08-30 | Discharge: 2023-08-30 | Disposition: A | Discharge status: Home / Self Care | Payer: Medicare HMO | Attending: Cardiovascular Disease | Admitting: Cardiovascular Disease

## 2023-08-30 DIAGNOSIS — I350 Nonrheumatic aortic (valve) stenosis: Secondary | ICD-10-CM | POA: Diagnosis not present

## 2023-08-30 DIAGNOSIS — I358 Other nonrheumatic aortic valve disorders: Secondary | ICD-10-CM | POA: Insufficient documentation

## 2023-08-30 LAB — BASIC METABOLIC PANEL
Anion gap: 9 (ref 5–15)
BUN: 36 mg/dL — ABNORMAL HIGH (ref 8–23)
CO2: 24 mmol/L (ref 22–32)
Calcium: 10 mg/dL (ref 8.9–10.3)
Chloride: 103 mmol/L (ref 98–111)
Creatinine, Ser: 1.51 mg/dL — ABNORMAL HIGH (ref 0.44–1.00)
GFR, Estimated: 33 mL/min — ABNORMAL LOW (ref >60)
Glucose, Bld: 183 mg/dL — ABNORMAL HIGH (ref 70–99)
Potassium: 4 mmol/L (ref 3.5–5.1)
Sodium: 136 mmol/L (ref 135–145)

## 2023-08-30 MED ORDER — IOHEXOL 350 MG/ML SOLN
95.0000 mL | Freq: Once | INTRAVENOUS | Status: AC | PRN
  Administered 2023-08-30: 95 mL via INTRAVENOUS

## 2023-08-30 MED ORDER — SODIUM CHLORIDE 0.9 % WEIGHT BASED INFUSION: 1.0000 mL/kg/h | INTRAVENOUS | Status: AC

## 2023-08-30 MED ORDER — SODIUM CHLORIDE 0.9 % WEIGHT BASED INFUSION
3.0000 mL/kg/h | INTRAVENOUS | Status: AC
  Administered 2023-08-30: 3 mL/kg/h via INTRAVENOUS

## 2023-09-03 NOTE — Progress Notes (Signed)
Procedure Type: Isolated AVR Perioperative Outcome Estimate % Operative Mortality 11.4% Morbidity & Mortality 20% Stroke 2.66% Renal Failure 6.83% Reoperation 3.4% Prolonged Ventilation 12.8% Deep Sternal Wound Infection 0.136% Long Hospital Stay (>14 days) 14.5% Short Hospital Stay (<6 days)* 16.4%

## 2023-09-08 ENCOUNTER — Encounter: Payer: Self-pay | Admitting: Surgery

## 2023-09-08 ENCOUNTER — Ambulatory Visit: Payer: Medicare HMO | Admitting: Family Medicine

## 2023-09-08 ENCOUNTER — Telehealth: Payer: Self-pay

## 2023-09-08 ENCOUNTER — Institutional Professional Consult (permissible substitution): Payer: Medicare HMO | Admitting: Surgery

## 2023-09-08 ENCOUNTER — Encounter: Payer: Self-pay | Admitting: Family Medicine

## 2023-09-08 VITALS — BP 138/82 | HR 101 | Temp 98.1°F | Ht 63.58 in | Wt 189.2 lb

## 2023-09-08 VITALS — BP 146/81 | HR 83 | Resp 18 | Ht 63.0 in | Wt 189.0 lb

## 2023-09-08 DIAGNOSIS — Z794 Long term (current) use of insulin: Secondary | ICD-10-CM | POA: Diagnosis not present

## 2023-09-08 DIAGNOSIS — I350 Nonrheumatic aortic (valve) stenosis: Secondary | ICD-10-CM

## 2023-09-08 DIAGNOSIS — I10 Essential (primary) hypertension: Secondary | ICD-10-CM

## 2023-09-08 DIAGNOSIS — N1832 Chronic kidney disease, stage 3b: Secondary | ICD-10-CM | POA: Diagnosis not present

## 2023-09-08 DIAGNOSIS — E1122 Type 2 diabetes mellitus with diabetic chronic kidney disease: Secondary | ICD-10-CM | POA: Diagnosis not present

## 2023-09-08 NOTE — Progress Notes (Signed)
Patient ID: Bonnie Chavez, female   DOB: 10/01/1934, 89 y.o.   MRN: [id number]  [facility]       [address]       [place] [address]             [phone]          CARDIOTHORACIC SURGERY CONSULTATION REPORT  PCP is Deeann Saint, MD Referring Provider is Tonny Bollman, MD Primary Cardiologist is Nicki Guadalajara, MD  Reason for consultation: Severe aortic stenosis  HPI:  The patient is an 89 year old woman with history of type 2 diabetes, hypertension, hyperlipidemia, chronic kidney disease, degenerative arthritis status post bilateral knee and shoulder replacements, severe aortic stenosis, and chronic diastolic heart failure who was referred for evaluation for TAVR.  She reports progressive exertional fatigue and shortness of breath over the past year.  She is able to do some activities around the house but nothing strenuous.  She was going to the [facility] 3 days/week to do water aerobics but then decreased that to 2 times a week and now is not going at all.  Her most recent echocardiogram on 03/24/2023 showed a severely calcified and thickened aortic valve with restricted leaflet mobility.  The mean gradient had increased to 32 mmHg with a peak of 53 mmHg.  Valve area by VTI was 0.72 cm.  Dimensionless index was 0.23.  She lives alone.  She is here today with her son who lives a few minutes away.  She has another son who lives in [place].  Past Medical History:  Diagnosis Date   Anemia    History of , resolved   Aortic valve sclerosis    Carpal tunnel syndrome    CHF (congestive heart failure) (HCC)    Chicken pox    Chronic low back pain 02/27/2016   -DDD -seeing [facility]    Diabetes mellitus    type 2, sees Dr. Talmage Nap   Goiter    Dr. Talmage Nap   Heart murmur    History of blood transfusion    auto transfusion post knee replacement   History of nuclear stress test 06/2010   dipyridamole; normal  pattern of perfusion; ekg negative for ischemia; low risk scan    Hot flashes    Hyperlipidemia    Hyperparathyroidism (HCC) 02/27/2016   -sees endocrinologist, Dr. Talmage Nap, has seen surgeon as well    Hypertension    Migraine    no longer has migraines (80's) after menopause   Mitral valve prolapse    Neuropathy    Obesity 02/27/2016   Osteoarthritis    DDD, sees [facility]   PMB (postmenopausal bleeding) 02/28/2009   Resolved   Pneumonia    history of    Renal insufficiency    seen at [place] /kidney   Vaginal atrophy    Vitamin D deficiency     Past Surgical History:  Procedure Laterality Date   CARPAL TUNNEL RELEASE Right 04/30/2023   Procedure: RIGHT CARPAL TUNNEL RELEASE;  Surgeon: Betha Loa, MD;  Location: [facility] OR;  Service: Orthopedics;  Laterality: Right;  30 MIN   CESAREAN SECTION  1972 &1976   CHOLECYSTECTOMY  1991   COLONOSCOPY     CORONARY ANGIOGRAPHY N/A 08/16/2023   Procedure: CORONARY ANGIOGRAPHY;  Surgeon: Tonny Bollman, MD;  Location: [facility] INVASIVE CV LAB;  Service: Cardiovascular;  Laterality: N/A;   DILATATION & CURRETTAGE/HYSTEROSCOPY WITH RESECTOCOPE N/A 05/15/2013   Procedure: DILATATION & CURETTAGE/HYSTEROSCOPY  Patient ID: Bonnie Chavez, female   DOB: 10/01/1934, 89 y.o.   MRN: [id number]  [facility]       [address]       [place] [address]             [phone]          CARDIOTHORACIC SURGERY CONSULTATION REPORT  PCP is Deeann Saint, MD Referring Provider is Tonny Bollman, MD Primary Cardiologist is Nicki Guadalajara, MD  Reason for consultation: Severe aortic stenosis  HPI:  The patient is an 89 year old woman with history of type 2 diabetes, hypertension, hyperlipidemia, chronic kidney disease, degenerative arthritis status post bilateral knee and shoulder replacements, severe aortic stenosis, and chronic diastolic heart failure who was referred for evaluation for TAVR.  She reports progressive exertional fatigue and shortness of breath over the past year.  She is able to do some activities around the house but nothing strenuous.  She was going to the [facility] 3 days/week to do water aerobics but then decreased that to 2 times a week and now is not going at all.  Her most recent echocardiogram on 03/24/2023 showed a severely calcified and thickened aortic valve with restricted leaflet mobility.  The mean gradient had increased to 32 mmHg with a peak of 53 mmHg.  Valve area by VTI was 0.72 cm.  Dimensionless index was 0.23.  She lives alone.  She is here today with her son who lives a few minutes away.  She has another son who lives in [place].  Past Medical History:  Diagnosis Date   Anemia    History of , resolved   Aortic valve sclerosis    Carpal tunnel syndrome    CHF (congestive heart failure) (HCC)    Chicken pox    Chronic low back pain 02/27/2016   -DDD -seeing [facility]    Diabetes mellitus    type 2, sees Dr. Talmage Nap   Goiter    Dr. Talmage Nap   Heart murmur    History of blood transfusion    auto transfusion post knee replacement   History of nuclear stress test 06/2010   dipyridamole; normal  pattern of perfusion; ekg negative for ischemia; low risk scan    Hot flashes    Hyperlipidemia    Hyperparathyroidism (HCC) 02/27/2016   -sees endocrinologist, Dr. Talmage Nap, has seen surgeon as well    Hypertension    Migraine    no longer has migraines (80's) after menopause   Mitral valve prolapse    Neuropathy    Obesity 02/27/2016   Osteoarthritis    DDD, sees [facility]   PMB (postmenopausal bleeding) 02/28/2009   Resolved   Pneumonia    history of    Renal insufficiency    seen at [place] /kidney   Vaginal atrophy    Vitamin D deficiency     Past Surgical History:  Procedure Laterality Date   CARPAL TUNNEL RELEASE Right 04/30/2023   Procedure: RIGHT CARPAL TUNNEL RELEASE;  Surgeon: Betha Loa, MD;  Location: [facility];  Service: Orthopedics;  Laterality: Right;  30 MIN   CESAREAN SECTION  1972 &1976   CHOLECYSTECTOMY  1991   COLONOSCOPY     CORONARY ANGIOGRAPHY N/A 08/16/2023   Procedure: CORONARY ANGIOGRAPHY;  Surgeon: Tonny Bollman, MD;  Location: [facility] INVASIVE CV LAB;  Service: Cardiovascular;  Laterality: N/A;   DILATATION & CURRETTAGE/HYSTEROSCOPY WITH RESECTOCOPE N/A 05/15/2013   Procedure: DILATATION & CURETTAGE/HYSTEROSCOPY  Patient ID: Bonnie Chavez, female   DOB: 10/01/1934, 89 y.o.   MRN: [id number]  [facility]       [address]       [place] [address]             [phone]          CARDIOTHORACIC SURGERY CONSULTATION REPORT  PCP is Deeann Saint, MD Referring Provider is Tonny Bollman, MD Primary Cardiologist is Nicki Guadalajara, MD  Reason for consultation: Severe aortic stenosis  HPI:  The patient is an 89 year old woman with history of type 2 diabetes, hypertension, hyperlipidemia, chronic kidney disease, degenerative arthritis status post bilateral knee and shoulder replacements, severe aortic stenosis, and chronic diastolic heart failure who was referred for evaluation for TAVR.  She reports progressive exertional fatigue and shortness of breath over the past year.  She is able to do some activities around the house but nothing strenuous.  She was going to the [facility] 3 days/week to do water aerobics but then decreased that to 2 times a week and now is not going at all.  Her most recent echocardiogram on 03/24/2023 showed a severely calcified and thickened aortic valve with restricted leaflet mobility.  The mean gradient had increased to 32 mmHg with a peak of 53 mmHg.  Valve area by VTI was 0.72 cm.  Dimensionless index was 0.23.  She lives alone.  She is here today with her son who lives a few minutes away.  She has another son who lives in [place].  Past Medical History:  Diagnosis Date   Anemia    History of , resolved   Aortic valve sclerosis    Carpal tunnel syndrome    CHF (congestive heart failure) (HCC)    Chicken pox    Chronic low back pain 02/27/2016   -DDD -seeing [facility]    Diabetes mellitus    type 2, sees Dr. Talmage Nap   Goiter    Dr. Talmage Nap   Heart murmur    History of blood transfusion    auto transfusion post knee replacement   History of nuclear stress test 06/2010   dipyridamole; normal  pattern of perfusion; ekg negative for ischemia; low risk scan    Hot flashes    Hyperlipidemia    Hyperparathyroidism (HCC) 02/27/2016   -sees endocrinologist, Dr. Talmage Nap, has seen surgeon as well    Hypertension    Migraine    no longer has migraines (80's) after menopause   Mitral valve prolapse    Neuropathy    Obesity 02/27/2016   Osteoarthritis    DDD, sees [facility]   PMB (postmenopausal bleeding) 02/28/2009   Resolved   Pneumonia    history of    Renal insufficiency    seen at [place] /kidney   Vaginal atrophy    Vitamin D deficiency     Past Surgical History:  Procedure Laterality Date   CARPAL TUNNEL RELEASE Right 04/30/2023   Procedure: RIGHT CARPAL TUNNEL RELEASE;  Surgeon: Betha Loa, MD;  Location: [facility];  Service: Orthopedics;  Laterality: Right;  30 MIN   CESAREAN SECTION  1972 &1976   CHOLECYSTECTOMY  1991   COLONOSCOPY     CORONARY ANGIOGRAPHY N/A 08/16/2023   Procedure: CORONARY ANGIOGRAPHY;  Surgeon: Tonny Bollman, MD;  Location: [facility] INVASIVE CV LAB;  Service: Cardiovascular;  Laterality: N/A;   DILATATION & CURRETTAGE/HYSTEROSCOPY WITH RESECTOCOPE N/A 05/15/2013   Procedure: DILATATION & CURETTAGE/HYSTEROSCOPY  Patient ID: Bonnie Chavez, female   DOB: 10/01/1934, 89 y.o.   MRN: [id number]  [facility]       [address]       [place] [address]             [phone]          CARDIOTHORACIC SURGERY CONSULTATION REPORT  PCP is Deeann Saint, MD Referring Provider is Tonny Bollman, MD Primary Cardiologist is Nicki Guadalajara, MD  Reason for consultation: Severe aortic stenosis  HPI:  The patient is an 89 year old woman with history of type 2 diabetes, hypertension, hyperlipidemia, chronic kidney disease, degenerative arthritis status post bilateral knee and shoulder replacements, severe aortic stenosis, and chronic diastolic heart failure who was referred for evaluation for TAVR.  She reports progressive exertional fatigue and shortness of breath over the past year.  She is able to do some activities around the house but nothing strenuous.  She was going to the [facility] 3 days/week to do water aerobics but then decreased that to 2 times a week and now is not going at all.  Her most recent echocardiogram on 03/24/2023 showed a severely calcified and thickened aortic valve with restricted leaflet mobility.  The mean gradient had increased to 32 mmHg with a peak of 53 mmHg.  Valve area by VTI was 0.72 cm.  Dimensionless index was 0.23.  She lives alone.  She is here today with her son who lives a few minutes away.  She has another son who lives in [place].  Past Medical History:  Diagnosis Date   Anemia    History of , resolved   Aortic valve sclerosis    Carpal tunnel syndrome    CHF (congestive heart failure) (HCC)    Chicken pox    Chronic low back pain 02/27/2016   -DDD -seeing [facility]    Diabetes mellitus    type 2, sees Dr. Talmage Nap   Goiter    Dr. Talmage Nap   Heart murmur    History of blood transfusion    auto transfusion post knee replacement   History of nuclear stress test 06/2010   dipyridamole; normal  pattern of perfusion; ekg negative for ischemia; low risk scan    Hot flashes    Hyperlipidemia    Hyperparathyroidism (HCC) 02/27/2016   -sees endocrinologist, Dr. Talmage Nap, has seen surgeon as well    Hypertension    Migraine    no longer has migraines (80's) after menopause   Mitral valve prolapse    Neuropathy    Obesity 02/27/2016   Osteoarthritis    DDD, sees [facility]   PMB (postmenopausal bleeding) 02/28/2009   Resolved   Pneumonia    history of    Renal insufficiency    seen at [place] /kidney   Vaginal atrophy    Vitamin D deficiency     Past Surgical History:  Procedure Laterality Date   CARPAL TUNNEL RELEASE Right 04/30/2023   Procedure: RIGHT CARPAL TUNNEL RELEASE;  Surgeon: Betha Loa, MD;  Location: [facility];  Service: Orthopedics;  Laterality: Right;  30 MIN   CESAREAN SECTION  1972 &1976   CHOLECYSTECTOMY  1991   COLONOSCOPY     CORONARY ANGIOGRAPHY N/A 08/16/2023   Procedure: CORONARY ANGIOGRAPHY;  Surgeon: Tonny Bollman, MD;  Location: [facility] INVASIVE CV LAB;  Service: Cardiovascular;  Laterality: N/A;   DILATATION & CURRETTAGE/HYSTEROSCOPY WITH RESECTOCOPE N/A 05/15/2013   Procedure: DILATATION & CURETTAGE/HYSTEROSCOPY  Abdomen:  soft, non-tender, no masses   Extremities:  warm, well-perfused, pedal pulses palpable, mild bilateral lower extremity edema  Rectal/GU  Deferred  Neuro:   Grossly non-focal and symmetrical throughout  Skin:   Clean and dry, no rashes, no breakdown  Diagnostic Tests:  ECHOCARDIOGRAM REPORT       Patient Name:   Bonnie Chavez Date of Exam: 03/24/2023  Medical Rec #:  [id number]     Height:       63.0 in  Accession #:    [id number]    Weight:       190.2 lb  Date of Birth:  May 28, 1934      BSA:          1.893 m  Patient Age:    88 years      BP:           118/58 mmHg  Patient Gender: F             HR:           58 bpm.  Exam Location:  [address]   Procedure: 2D Echo, Cardiac Doppler, Color Doppler and 3D Echo   Indications:    Aortic Stenosis I35.0    History:        Patient has prior history of Echocardiogram examinations,  most                 recent 09/03/2022. Risk Factors:Hypertension, Dyslipidemia  and                 Diabetes.    Sonographer:    Thurman Coyer RDCS  Referring Phys:  [phone] THOMAS A KELLY   IMPRESSIONS     1. Left ventricular ejection fraction, by estimation, is 60 to 65%. Left  ventricular ejection fraction by 3D volume is 62 %. The left ventricle has  normal function. The left ventricle has no regional wall motion  abnormalities. There is mild left  ventricular hypertrophy. Left ventricular diastolic parameters are  consistent with Grade II diastolic dysfunction (pseudonormalization).  Elevated left atrial pressure.   2. Right ventricular systolic function is normal. The right ventricular  size is normal. There is normal pulmonary artery systolic pressure. The  estimated right ventricular systolic pressure is 26.0 mmHg.   3. Right atrial size was mildly dilated.   4. The mitral valve is degenerative. Trivial mitral valve regurgitation.  No evidence of mitral stenosis. Moderate mitral annular calcification.   5. The inferior vena cava is normal in size with greater than 50%  respiratory variability, suggesting right atrial pressure of 3 mmHg.   6. The aortic valve is calcified. There is severe calcifcation of the  aortic valve. Aortic valve regurgitation is not visualized. Severe aortic  valve stenosis. Vmax 4.0 m/s, MG , AVA 0.7 cm^2, DI 0.23   FINDINGS   Left Ventricle: Left ventricular ejection fraction, by estimation, is 60  to 65%. Left ventricular ejection fraction by 3D volume is 62 %. The left  ventricle has normal function. The left ventricle has no regional wall  motion abnormalities. The left  ventricular internal cavity size was normal in size. There is mild left  ventricular hypertrophy. Left ventricular diastolic parameters are  consistent with Grade II diastolic dysfunction (pseudonormalization).  Elevated left atrial pressure.   Right Ventricle: The right ventricular size is normal. No increase in  right ventricular wall thickness. Right ventricular systolic function is  normal. There is normal pulmonary artery systolic  pressure. The  Abdomen:  soft, non-tender, no masses   Extremities:  warm, well-perfused, pedal pulses palpable, mild bilateral lower extremity edema  Rectal/GU  Deferred  Neuro:   Grossly non-focal and symmetrical throughout  Skin:   Clean and dry, no rashes, no breakdown  Diagnostic Tests:  ECHOCARDIOGRAM REPORT       Patient Name:   Bonnie Chavez Date of Exam: 03/24/2023  Medical Rec #:  [id number]     Height:       63.0 in  Accession #:    [id number]    Weight:       190.2 lb  Date of Birth:  May 28, 1934      BSA:          1.893 m  Patient Age:    88 years      BP:           118/58 mmHg  Patient Gender: F             HR:           58 bpm.  Exam Location:  [address]   Procedure: 2D Echo, Cardiac Doppler, Color Doppler and 3D Echo   Indications:    Aortic Stenosis I35.0    History:        Patient has prior history of Echocardiogram examinations,  most                 recent 09/03/2022. Risk Factors:Hypertension, Dyslipidemia  and                 Diabetes.    Sonographer:    Thurman Coyer RDCS  Referring Phys:  [phone] THOMAS A KELLY   IMPRESSIONS     1. Left ventricular ejection fraction, by estimation, is 60 to 65%. Left  ventricular ejection fraction by 3D volume is 62 %. The left ventricle has  normal function. The left ventricle has no regional wall motion  abnormalities. There is mild left  ventricular hypertrophy. Left ventricular diastolic parameters are  consistent with Grade II diastolic dysfunction (pseudonormalization).  Elevated left atrial pressure.   2. Right ventricular systolic function is normal. The right ventricular  size is normal. There is normal pulmonary artery systolic pressure. The  estimated right ventricular systolic pressure is 26.0 mmHg.   3. Right atrial size was mildly dilated.   4. The mitral valve is degenerative. Trivial mitral valve regurgitation.  No evidence of mitral stenosis. Moderate mitral annular calcification.   5. The inferior vena cava is normal in size with greater than 50%  respiratory variability, suggesting right atrial pressure of 3 mmHg.   6. The aortic valve is calcified. There is severe calcifcation of the  aortic valve. Aortic valve regurgitation is not visualized. Severe aortic  valve stenosis. Vmax 4.0 m/s, MG , AVA 0.7 cm^2, DI 0.23   FINDINGS   Left Ventricle: Left ventricular ejection fraction, by estimation, is 60  to 65%. Left ventricular ejection fraction by 3D volume is 62 %. The left  ventricle has normal function. The left ventricle has no regional wall  motion abnormalities. The left  ventricular internal cavity size was normal in size. There is mild left  ventricular hypertrophy. Left ventricular diastolic parameters are  consistent with Grade II diastolic dysfunction (pseudonormalization).  Elevated left atrial pressure.   Right Ventricle: The right ventricular size is normal. No increase in  right ventricular wall thickness. Right ventricular systolic function is  normal. There is normal pulmonary artery systolic  pressure. The  Abdomen:  soft, non-tender, no masses   Extremities:  warm, well-perfused, pedal pulses palpable, mild bilateral lower extremity edema  Rectal/GU  Deferred  Neuro:   Grossly non-focal and symmetrical throughout  Skin:   Clean and dry, no rashes, no breakdown  Diagnostic Tests:  ECHOCARDIOGRAM REPORT       Patient Name:   Bonnie Chavez Date of Exam: 03/24/2023  Medical Rec #:  [id number]     Height:       63.0 in  Accession #:    [id number]    Weight:       190.2 lb  Date of Birth:  May 28, 1934      BSA:          1.893 m  Patient Age:    88 years      BP:           118/58 mmHg  Patient Gender: F             HR:           58 bpm.  Exam Location:  [address]   Procedure: 2D Echo, Cardiac Doppler, Color Doppler and 3D Echo   Indications:    Aortic Stenosis I35.0    History:        Patient has prior history of Echocardiogram examinations,  most                 recent 09/03/2022. Risk Factors:Hypertension, Dyslipidemia  and                 Diabetes.    Sonographer:    Thurman Coyer RDCS  Referring Phys:  [phone] THOMAS A KELLY   IMPRESSIONS     1. Left ventricular ejection fraction, by estimation, is 60 to 65%. Left  ventricular ejection fraction by 3D volume is 62 %. The left ventricle has  normal function. The left ventricle has no regional wall motion  abnormalities. There is mild left  ventricular hypertrophy. Left ventricular diastolic parameters are  consistent with Grade II diastolic dysfunction (pseudonormalization).  Elevated left atrial pressure.   2. Right ventricular systolic function is normal. The right ventricular  size is normal. There is normal pulmonary artery systolic pressure. The  estimated right ventricular systolic pressure is 26.0 mmHg.   3. Right atrial size was mildly dilated.   4. The mitral valve is degenerative. Trivial mitral valve regurgitation.  No evidence of mitral stenosis. Moderate mitral annular calcification.   5. The inferior vena cava is normal in size with greater than 50%  respiratory variability, suggesting right atrial pressure of 3 mmHg.   6. The aortic valve is calcified. There is severe calcifcation of the  aortic valve. Aortic valve regurgitation is not visualized. Severe aortic  valve stenosis. Vmax 4.0 m/s, MG , AVA 0.7 cm^2, DI 0.23   FINDINGS   Left Ventricle: Left ventricular ejection fraction, by estimation, is 60  to 65%. Left ventricular ejection fraction by 3D volume is 62 %. The left  ventricle has normal function. The left ventricle has no regional wall  motion abnormalities. The left  ventricular internal cavity size was normal in size. There is mild left  ventricular hypertrophy. Left ventricular diastolic parameters are  consistent with Grade II diastolic dysfunction (pseudonormalization).  Elevated left atrial pressure.   Right Ventricle: The right ventricular size is normal. No increase in  right ventricular wall thickness. Right ventricular systolic function is  normal. There is normal pulmonary artery systolic  pressure. The  Abdomen:  soft, non-tender, no masses   Extremities:  warm, well-perfused, pedal pulses palpable, mild bilateral lower extremity edema  Rectal/GU  Deferred  Neuro:   Grossly non-focal and symmetrical throughout  Skin:   Clean and dry, no rashes, no breakdown  Diagnostic Tests:  ECHOCARDIOGRAM REPORT       Patient Name:   Bonnie Chavez Date of Exam: 03/24/2023  Medical Rec #:  [id number]     Height:       63.0 in  Accession #:    [id number]    Weight:       190.2 lb  Date of Birth:  May 28, 1934      BSA:          1.893 m  Patient Age:    88 years      BP:           118/58 mmHg  Patient Gender: F             HR:           58 bpm.  Exam Location:  [address]   Procedure: 2D Echo, Cardiac Doppler, Color Doppler and 3D Echo   Indications:    Aortic Stenosis I35.0    History:        Patient has prior history of Echocardiogram examinations,  most                 recent 09/03/2022. Risk Factors:Hypertension, Dyslipidemia  and                 Diabetes.    Sonographer:    Thurman Coyer RDCS  Referring Phys:  [phone] THOMAS A KELLY   IMPRESSIONS     1. Left ventricular ejection fraction, by estimation, is 60 to 65%. Left  ventricular ejection fraction by 3D volume is 62 %. The left ventricle has  normal function. The left ventricle has no regional wall motion  abnormalities. There is mild left  ventricular hypertrophy. Left ventricular diastolic parameters are  consistent with Grade II diastolic dysfunction (pseudonormalization).  Elevated left atrial pressure.   2. Right ventricular systolic function is normal. The right ventricular  size is normal. There is normal pulmonary artery systolic pressure. The  estimated right ventricular systolic pressure is 26.0 mmHg.   3. Right atrial size was mildly dilated.   4. The mitral valve is degenerative. Trivial mitral valve regurgitation.  No evidence of mitral stenosis. Moderate mitral annular calcification.   5. The inferior vena cava is normal in size with greater than 50%  respiratory variability, suggesting right atrial pressure of 3 mmHg.   6. The aortic valve is calcified. There is severe calcifcation of the  aortic valve. Aortic valve regurgitation is not visualized. Severe aortic  valve stenosis. Vmax 4.0 m/s, MG , AVA 0.7 cm^2, DI 0.23   FINDINGS   Left Ventricle: Left ventricular ejection fraction, by estimation, is 60  to 65%. Left ventricular ejection fraction by 3D volume is 62 %. The left  ventricle has normal function. The left ventricle has no regional wall  motion abnormalities. The left  ventricular internal cavity size was normal in size. There is mild left  ventricular hypertrophy. Left ventricular diastolic parameters are  consistent with Grade II diastolic dysfunction (pseudonormalization).  Elevated left atrial pressure.   Right Ventricle: The right ventricular size is normal. No increase in  right ventricular wall thickness. Right ventricular systolic function is  normal. There is normal pulmonary artery systolic  pressure. The  Patient ID: Bonnie Chavez, female   DOB: 10/01/1934, 89 y.o.   MRN: [id number]  [facility]       [address]       [place] [address]             [phone]          CARDIOTHORACIC SURGERY CONSULTATION REPORT  PCP is Deeann Saint, MD Referring Provider is Tonny Bollman, MD Primary Cardiologist is Nicki Guadalajara, MD  Reason for consultation: Severe aortic stenosis  HPI:  The patient is an 89 year old woman with history of type 2 diabetes, hypertension, hyperlipidemia, chronic kidney disease, degenerative arthritis status post bilateral knee and shoulder replacements, severe aortic stenosis, and chronic diastolic heart failure who was referred for evaluation for TAVR.  She reports progressive exertional fatigue and shortness of breath over the past year.  She is able to do some activities around the house but nothing strenuous.  She was going to the [facility] 3 days/week to do water aerobics but then decreased that to 2 times a week and now is not going at all.  Her most recent echocardiogram on 03/24/2023 showed a severely calcified and thickened aortic valve with restricted leaflet mobility.  The mean gradient had increased to 32 mmHg with a peak of 53 mmHg.  Valve area by VTI was 0.72 cm.  Dimensionless index was 0.23.  She lives alone.  She is here today with her son who lives a few minutes away.  She has another son who lives in [place].  Past Medical History:  Diagnosis Date   Anemia    History of , resolved   Aortic valve sclerosis    Carpal tunnel syndrome    CHF (congestive heart failure) (HCC)    Chicken pox    Chronic low back pain 02/27/2016   -DDD -seeing [facility]    Diabetes mellitus    type 2, sees Dr. Talmage Nap   Goiter    Dr. Talmage Nap   Heart murmur    History of blood transfusion    auto transfusion post knee replacement   History of nuclear stress test 06/2010   dipyridamole; normal  pattern of perfusion; ekg negative for ischemia; low risk scan    Hot flashes    Hyperlipidemia    Hyperparathyroidism (HCC) 02/27/2016   -sees endocrinologist, Dr. Talmage Nap, has seen surgeon as well    Hypertension    Migraine    no longer has migraines (80's) after menopause   Mitral valve prolapse    Neuropathy    Obesity 02/27/2016   Osteoarthritis    DDD, sees [facility] [facility]   PMB (postmenopausal bleeding) 02/28/2009   Resolved   Pneumonia    history of    Renal insufficiency    seen at [facility] /kidney   Vaginal atrophy    Vitamin D deficiency     Past Surgical History:  Procedure Laterality Date   CARPAL TUNNEL RELEASE Right 04/30/2023   Procedure: RIGHT CARPAL TUNNEL RELEASE;  Surgeon: Betha Loa, MD;  Location: [facility];  Service: Orthopedics;  Laterality: Right;  30 MIN   CESAREAN SECTION  1972 &1976   CHOLECYSTECTOMY  1991   COLONOSCOPY     CORONARY ANGIOGRAPHY N/A 08/16/2023   Procedure: CORONARY ANGIOGRAPHY;  Surgeon: Tonny Bollman, MD;  Location: [facility] INVASIVE CV LAB;  Service: Cardiovascular;  Laterality: N/A;   DILATATION & CURRETTAGE/HYSTEROSCOPY WITH RESECTOCOPE N/A 05/15/2013   Procedure: DILATATION & CURETTAGE/HYSTEROSCOPY  Abdomen:  soft, non-tender, no masses   Extremities:  warm, well-perfused, pedal pulses palpable, mild bilateral lower extremity edema  Rectal/GU  Deferred  Neuro:   Grossly non-focal and symmetrical throughout  Skin:   Clean and dry, no rashes, no breakdown  Diagnostic Tests:  ECHOCARDIOGRAM REPORT       Patient Name:   Bonnie Chavez Date of Exam: 03/24/2023  Medical Rec #:  [id number]     Height:       63.0 in  Accession #:    [id number]    Weight:       190.2 lb  Date of Birth:  May 28, 1934      BSA:          1.893 m  Patient Age:    88 years      BP:           118/58 mmHg  Patient Gender: F             HR:           58 bpm.  Exam Location:  [address]   Procedure: 2D Echo, Cardiac Doppler, Color Doppler and 3D Echo   Indications:    Aortic Stenosis I35.0    History:        Patient has prior history of Echocardiogram examinations,  most                 recent 09/03/2022. Risk Factors:Hypertension, Dyslipidemia  and                 Diabetes.    Sonographer:    Thurman Coyer RDCS  Referring Phys:  [phone] THOMAS A KELLY   IMPRESSIONS     1. Left ventricular ejection fraction, by estimation, is 60 to 65%. Left  ventricular ejection fraction by 3D volume is 62 %. The left ventricle has  normal function. The left ventricle has no regional wall motion  abnormalities. There is mild left  ventricular hypertrophy. Left ventricular diastolic parameters are  consistent with Grade II diastolic dysfunction (pseudonormalization).  Elevated left atrial pressure.   2. Right ventricular systolic function is normal. The right ventricular  size is normal. There is normal pulmonary artery systolic pressure. The  estimated right ventricular systolic pressure is 26.0 mmHg.   3. Right atrial size was mildly dilated.   4. The mitral valve is degenerative. Trivial mitral valve regurgitation.  No evidence of mitral stenosis. Moderate mitral annular calcification.   5. The inferior vena cava is normal in size with greater than 50%  respiratory variability, suggesting right atrial pressure of 3 mmHg.   6. The aortic valve is calcified. There is severe calcifcation of the  aortic valve. Aortic valve regurgitation is not visualized. Severe aortic  valve stenosis. Vmax 4.0 m/s, MG , AVA 0.7 cm^2, DI 0.23   FINDINGS   Left Ventricle: Left ventricular ejection fraction, by estimation, is 60  to 65%. Left ventricular ejection fraction by 3D volume is 62 %. The left  ventricle has normal function. The left ventricle has no regional wall  motion abnormalities. The left  ventricular internal cavity size was normal in size. There is mild left  ventricular hypertrophy. Left ventricular diastolic parameters are  consistent with Grade II diastolic dysfunction (pseudonormalization).  Elevated left atrial pressure.   Right Ventricle: The right ventricular size is normal. No increase in  right ventricular wall thickness. Right ventricular systolic function is  normal. There is normal pulmonary artery systolic  pressure. The  Patient ID: Bonnie Chavez, female   DOB: 10/01/1934, 89 y.o.   MRN: [id number]  [facility]VE [facility]IN[facility]       [address]       [place] [address]             [phone]          CARDIOTHORACIC SURGERY CONSULTATION REPORT  PCP is Deeann Saint, MD Referring Provider is Tonny Bollman, MD Primary Cardiologist is Nicki Guadalajara, MD  Reason for consultation: Severe aortic stenosis  HPI:  The patient is an 89 year old woman with history of type 2 diabetes, hypertension, hyperlipidemia, chronic kidney disease, degenerative arthritis status post bilateral knee and shoulder replacements, severe aortic stenosis, and chronic diastolic heart failure who was referred for evaluation for TAVR.  She reports progressive exertional fatigue and shortness of breath over the past year.  She is able to do some activities around the house but nothing strenuous.  She was going to the [facility] 3 days/week to do water aerobics but then decreased that to 2 times a week and now is not going at all.  Her most recent echocardiogram on 03/24/2023 showed a severely calcified and thickened aortic valve with restricted leaflet mobility.  The mean gradient had increased to 32 mmHg with a peak of 53 mmHg.  Valve area by VTI was 0.72 cm.  Dimensionless index was 0.23.  She lives alone.  She is here today with her son who lives a few minutes away.  She has another son who lives in [place].  Past Medical History:  Diagnosis Date   Anemia    History of , resolved   Aortic valve sclerosis    Carpal tunnel syndrome    CHF (congestive heart failure) (HCC)    Chicken pox    Chronic low back pain 02/27/2016   -DDD -seeing [facility]    Diabetes mellitus    type 2, sees Dr. Talmage Nap   Goiter    Dr. Talmage Nap   Heart murmur    History of blood transfusion    auto transfusion post knee replacement   History of nuclear stress test 06/2010   dipyridamole; normal  pattern of perfusion; ekg negative for ischemia; low risk scan    Hot flashes    Hyperlipidemia    Hyperparathyroidism (HCC) 02/27/2016   -sees endocrinologist, Dr. Talmage Nap, has seen surgeon as well    Hypertension    Migraine    no longer has migraines (80's) after menopause   Mitral valve prolapse    Neuropathy    Obesity 02/27/2016   Osteoarthritis    DDD, sees [facility]   PMB (postmenopausal bleeding) 02/28/2009   Resolved   Pneumonia    history of    Renal insufficiency    seen at [facility] /kidney   Vaginal atrophy    Vitamin D deficiency     Past Surgical History:  Procedure Laterality Date   CARPAL TUNNEL RELEASE Right 04/30/2023   Procedure: RIGHT CARPAL TUNNEL RELEASE;  Surgeon: Betha Loa, MD;  Location: [facility];  Service: Orthopedics;  Laterality: Right;  30 MIN   CESAREAN SECTION  1972 &1976   CHOLECYSTECTOMY  1991   COLONOSCOPY     CORONARY ANGIOGRAPHY N/A 08/16/2023   Procedure: CORONARY ANGIOGRAPHY;  Surgeon: Tonny Bollman, MD;  Location: [facility] INVASIVE CV LAB;  Service: Cardiovascular;  Laterality: N/A;   DILATATION & CURRETTAGE/HYSTEROSCOPY WITH RESECTOCOPE N/A 05/15/2013   Procedure: DILATATION & CURETTAGE/HYSTEROSCOPY  Patient ID: Bonnie Chavez, female   DOB: 10/01/1934, 89 y.o.   MRN: [id number]  [facility]       [address]       [place] [address]             [phone]          CARDIOTHORACIC SURGERY CONSULTATION REPORT  PCP is Deeann Saint, MD Referring Provider is Tonny Bollman, MD Primary Cardiologist is Nicki Guadalajara, MD  Reason for consultation: Severe aortic stenosis  HPI:  The patient is an 89 year old woman with history of type 2 diabetes, hypertension, hyperlipidemia, chronic kidney disease, degenerative arthritis status post bilateral knee and shoulder replacements, severe aortic stenosis, and chronic diastolic heart failure who was referred for evaluation for TAVR.  She reports progressive exertional fatigue and shortness of breath over the past year.  She is able to do some activities around the house but nothing strenuous.  She was going to the [facility] 3 days/week to do water aerobics but then decreased that to 2 times a week and now is not going at all.  Her most recent echocardiogram on 03/24/2023 showed a severely calcified and thickened aortic valve with restricted leaflet mobility.  The mean gradient had increased to 32 mmHg with a peak of 53 mmHg.  Valve area by VTI was 0.72 cm.  Dimensionless index was 0.23.  She lives alone.  She is here today with her son who lives a few minutes away.  She has another son who lives in [place].  Past Medical History:  Diagnosis Date   Anemia    History of , resolved   Aortic valve sclerosis    Carpal tunnel syndrome    CHF (congestive heart failure) (HCC)    Chicken pox    Chronic low back pain 02/27/2016   -DDD -seeing [facility]    Diabetes mellitus    type 2, sees Dr. Talmage Nap   Goiter    Dr. Talmage Nap   Heart murmur    History of blood transfusion    auto transfusion post knee replacement   History of nuclear stress test 06/2010   dipyridamole; normal  pattern of perfusion; ekg negative for ischemia; low risk scan    Hot flashes    Hyperlipidemia    Hyperparathyroidism (HCC) 02/27/2016   -sees endocrinologist, Dr. Talmage Nap, has seen surgeon as well    Hypertension    Migraine    no longer has migraines (80's) after menopause   Mitral valve prolapse    Neuropathy    Obesity 02/27/2016   Osteoarthritis    DDD, sees [facility]   PMB (postmenopausal bleeding) 02/28/2009   Resolved   Pneumonia    history of    Renal insufficiency    seen at [facility] /kidney   Vaginal atrophy    Vitamin D deficiency     Past Surgical History:  Procedure Laterality Date   CARPAL TUNNEL RELEASE Right 04/30/2023   Procedure: RIGHT CARPAL TUNNEL RELEASE;  Surgeon: Betha Loa, MD;  Location: [facility];  Service: Orthopedics;  Laterality: Right;  30 MIN   CESAREAN SECTION  1972 &1976   CHOLECYSTECTOMY  1991   COLONOSCOPY     CORONARY ANGIOGRAPHY N/A 08/16/2023   Procedure: CORONARY ANGIOGRAPHY;  Surgeon: Tonny Bollman, MD;  Location: [facility] INVASIVE CV LAB;  Service: Cardiovascular;  Laterality: N/A;   DILATATION & CURRETTAGE/HYSTEROSCOPY WITH RESECTOCOPE N/A 05/15/2013   Procedure: DILATATION & CURETTAGE/HYSTEROSCOPY

## 2023-09-08 NOTE — Progress Notes (Signed)
Established Patient Office Visit   Subjective  Patient ID: Bonnie Chavez, female    DOB: July 02, 1934  Age: 89 y.o. MRN: [id number]  Chief Complaint  Patient presents with   Medical Management of Chronic Issues    3 mth follow-up     Patient is an 89 year old female seen for follow-up.  Patient states she is doing well overall.  Had a cardiac cath and cardiac CT.  Has appointment later today with cardiology to discuss TAVR.  Patient has an appointment at the end of the month with nephrology.  Will have labs done at that time.  Denies LE edema, SOB, CP.  Blood sugars well-controlled, 100s-140s in AM.   Pt having to split nebivolol 10 mg tabs in half but having difficulty since pharmacy changed to a different manufacturer.  Pills are breaking apart when split.  In the past insurance would not cover the 5 mg tabs.  Not currently monitoring BP as needs to get a new cuff.    Patient Active Problem List   Diagnosis Date Noted   Kidney disease 09/15/2018   Left ear impacted cerumen 02/01/2018   Hyperlipidemia associated with type 2 diabetes mellitus (HCC) 11/04/2017   Sensorineural hearing loss (SNHL) of both ears 01/19/2017   Mild aortic stenosis 03/10/2016   Hyperparathyroidism (HCC) 02/27/2016   Chronic low back pain 02/27/2016   Morbid obesity (HCC) 02/27/2016   Chronic diastolic congestive heart failure (HCC) 10/28/2015   Aortic valve sclerosis 08/15/2015   Chronic kidney disease 05/24/2014   Hypertension associated with diabetes (HCC) 08/21/2013   Hyperlipidemia 08/21/2013   PMB (postmenopausal bleeding) 05/15/2013   Multiple thyroid nodules 03/09/2013   Vitamin D deficiency 03/09/2013   Perimenopausal vasomotor symptoms 02/06/2010    Class: History of   Osteoarthritis 02/06/2010   Vaginal atrophy 01/12/2002    Class: History of   Diabetes mellitus, type II (HCC) 09/07/2001    Class: History of   Past Medical History:  Diagnosis Date   Anemia    History of , resolved    Aortic valve sclerosis    Carpal tunnel syndrome    CHF (congestive heart failure) (HCC)    Chicken pox    Chronic low back pain 02/27/2016   -DDD -seeing [facility]    Diabetes mellitus    type 2, sees Dr. Talmage Nap   Goiter    Dr. Talmage Nap   Heart murmur    History of blood transfusion    auto transfusion post knee replacement   History of nuclear stress test 06/2010   dipyridamole; normal pattern of perfusion; ekg negative for ischemia; low risk scan    Hot flashes    Hyperlipidemia    Hyperparathyroidism (HCC) 02/27/2016   -sees endocrinologist, Dr. Talmage Nap, has seen surgeon as well    Hypertension    Migraine    no longer has migraines (80's) after menopause   Mitral valve prolapse    Neuropathy    Obesity 02/27/2016   Osteoarthritis    DDD, sees [facility]   PMB (postmenopausal bleeding) 02/28/2009   Resolved   Pneumonia    history of    Renal insufficiency    seen at [facility] /kidney   Vaginal atrophy    Vitamin D deficiency    Past Surgical History:  Procedure Laterality Date   CARPAL TUNNEL RELEASE Right 04/30/2023   Procedure: RIGHT CARPAL TUNNEL RELEASE;  Surgeon: Betha Loa, MD;  Location: [facility];  Service: Orthopedics;  Laterality: Right;  30 MIN  CESAREAN SECTION  1972 &1976   CHOLECYSTECTOMY  1991   COLONOSCOPY     CORONARY ANGIOGRAPHY N/A 08/16/2023   Procedure: CORONARY ANGIOGRAPHY;  Surgeon: Tonny Bollman, MD;  Location: [facility] INVASIVE CV LAB;  Service: Cardiovascular;  Laterality: N/A;   DILATATION & CURRETTAGE/HYSTEROSCOPY WITH RESECTOCOPE N/A 05/15/2013   Procedure: DILATATION & CURETTAGE/HYSTEROSCOPY WITH RESECTOCOPE;  Surgeon: Hal Morales, MD;  Location: [facility] ORS;  Service: Gynecology;  Laterality: N/A;   DILATATION & CURRETTAGE/HYSTEROSCOPY WITH RESECTOCOPE N/A 06/03/2017   Procedure: DILATATION & CURETTAGE/HYSTEROSCOPY;  Surgeon: Hal Morales, MD;  Location: [facility] ORS;  Service: Gynecology;  Laterality: N/A;   DILATION AND CURETTAGE OF  UTERUS  1975   HEMORRHOID SURGERY  1997   KNEE SURGERY  2000 2011   right knee replacement    REPLACEMENT TOTAL KNEE  2000   left   SHOULDER SURGERY  2004 2010    TRANSTHORACIC ECHOCARDIOGRAM  07/2010   EF=>55%; LA mildly dilated; trace MR/TR;   TUBAL LIGATION  1976   Social History   Tobacco Use   Smoking status: Never   Smokeless tobacco: Never  Vaping Use   Vaping status: Never Used  Substance Use Topics   Alcohol use: No    Alcohol/week: 0.0 standard drinks of alcohol   Drug use: No   Family History  Problem Relation Age of Onset   Hypertension Mother    Diabetes Mother    Hyperlipidemia Mother    Heart attack Mother    Heart disease Father    Heart attack Father    Stroke Brother    Heart disease Brother    Diabetes Brother    Uterine cancer Maternal Grandmother    Allergies  Allergen Reactions   Lisinopril Hives    Cough, hives, and lip edema    Nsaids Other (See Comments)    Abnormal renal function   Penicillins Rash and Other (See Comments)    Pt has taken Keflex without difficulty Has patient had a PCN reaction causing immediate rash, facial/tongue/throat swelling, SOB or lightheadedness with hypotension: yes Has patient had a PCN reaction causing severe rash involving mucus membranes or skin necrosis: no Has patient had a PCN reaction that required hospitalization no Has patient had a PCN reaction occurring within the last 10 years: no If all of the above answers are "NO", then may proceed with Cephalosporin use.      ROS Negative unless stated above    Objective:     BP 138/82 (BP Location: Left Arm, Patient Position: Sitting, Cuff Size: Large)   Pulse (!) 101   Temp 98.1 F (36.7 C) (Oral)   Ht 5' 3.58" (1.615 m)   Wt 189 lb 3.2 oz (85.8 kg)   SpO2 97%   BMI 32.90 kg/m  BP Readings from Last 3 Encounters:  09/08/23 138/82  08/30/23 133/84  08/16/23 (!) 118/54   Wt Readings from Last 3 Encounters:  09/08/23 189 lb 3.2 oz (85.8 kg)   08/16/23 190 lb (86.2 kg)  06/07/23 182 lb 3.2 oz (82.6 kg)      Physical Exam Constitutional:      General: She is not in acute distress.    Appearance: Normal appearance.  HENT:     Head: Normocephalic and atraumatic.     Nose: Nose normal.     Mouth/Throat:     Mouth: Mucous membranes are moist.  Cardiovascular:     Rate and Rhythm: Normal rate and regular rhythm.     Heart  sounds: Murmur heard.     No gallop.     Comments: 3/6 murmur best heard in the right upper chest. Pulmonary:     Effort: Pulmonary effort is normal. No respiratory distress.     Breath sounds: Normal breath sounds. No wheezing, rhonchi or rales.  Musculoskeletal:     Right lower leg: No edema.     Left lower leg: No edema.  Skin:    General: Skin is warm and dry.  Neurological:     Mental Status: She is alert and oriented to person, place, and time.     No results found for any visits on 09/08/23.    Assessment & Plan:  Type 2 diabetes mellitus with stage 3b chronic kidney disease, with long-term current use of insulin (HCC) -Hemoglobin A1c 7.6% on 01/05/2023. -Will have updated A1c done in a few weeks with other labs at nephrology visit. -Patient to schedule eye exam -Continue current medications including Humalog 70/30 40 units in a.m. and 8 units in p.m. -Continue statin.  Intolerance to ACE I  Essential hypertension -BP controlled -Patient given new BP monitor in clinic. -Continue current medications including Norvasc 5 mg daily, nebivolol 5 mg twice daily. -Discussed writing new Rx for nebivolol 10 mg twice daily.  Patient wishes to wait at this time due to prior insurance issues.  Aortic valve stenosis, etiology of cardiac valve disease unspecified -Stable -Continue current medications including nebivolol 5 mg twice daily, Norvasc 5 mg daily  Discussed scheduling AWV.  Patient wishes to wait until December.  Will call back to clinic to set up a phone call. Return in about 4 months  (around 01/09/2024), or if symptoms worsen or fail to improve.   Deeann Saint, MD

## 2023-09-08 NOTE — Progress Notes (Signed)
Pre Surgical Assessment: 5 M Walk Test  65M=16.60ft  5 Meter Walk Test- trial 1: 7.01 seconds 5 Meter Walk Test- trial 2: 6.60 seconds 5 Meter Walk Test- trial 3: 6.68 seconds 5 Meter Walk Test Average: 6.76 seconds

## 2023-09-08 NOTE — Progress Notes (Signed)
09/08/2023  Patient ID: Bonnie Chavez, female   DOB: May 20, 1934, 89 y.o.   MRN: [id number]  The patients has a diagnosis of hypertension and it is medically necessary for them to have access to a home device to monitor blood pressure.  The patient does not have readily available insurance access to a device and cannot afford to purchase a device at this time.  The patient has been counseled that they do not need to continue to receive services from [facility] to receive a device.  The patient will be given a device free of charge.  Sherrill Raring, PharmD Clinical Pharmacist [phone]

## 2023-09-09 ENCOUNTER — Telehealth: Payer: Self-pay | Admitting: Physician Assistant

## 2023-09-09 NOTE — Telephone Encounter (Addendum)
  [facility]   MULTIDISCIPLINARY HEART VALVE TEAM   Patient called in and wants to move her tentative TAVR date to be moved from 11/26 to 11/12 with Dr. Excell Seltzer and Laneta Simmers. She said her son is coming in from [place] and that date works better. She prefers to arrive ~7am since she is a diabetic.   Cline Crock PA-C  [facility]

## 2023-09-12 NOTE — Progress Notes (Signed)
Patient ID: Bonnie Chavez, female   DOB: Mar 20, 1934, 89 y.o.   MRN: [id number]       HPI: Bonnie Chavez is a 89 y.o. female presents to the office today for a 12 month cardiology evaluation.  [name] has a history of hypertension with documented grade 1 diastolic dysfunction, hyperlipidemia, type 2 diabetes mellitus with renal insufficiency, as well as aortic valve sclerosis without stenosis. An echo Doppler study in 2011 showed an EF of 55%.  In 2011 a nuclear perfusion scan showed normal perfusion and function.  She has a history of a goiter for which she sees Dr. Cleon Gustin, [name] for her renal insufficiency, Dr. Pennie Rushing for uterine polyps and fibroids.  She has a history of hyperparathyroidism and saw Dr. Georgana Curio; she has not had surgery in his undergoing continued surveillance of her calcium levels.  She underwent a five-year follow-up echo Doppler study on 08/23/2015.  This continued to show normal systolic function with mild LVH and grade 1 diastolic dysfunction.  There was a 10 mm mean gradient and 22 mm peak gradient across her moderately calcified aortic valve with a valve area of 1.63 cm.    She has a long-standing history of diabetes mellitus  and sees Dr. Cleon Gustin.  She sees Dr. Darrick Penna for renal insufficiency.  I reviewed her recent blood work from his office 03/09/2017 which showed her creatinine had improved to 1.5.  In March 2018 Cr was 1.63 and in December 2017 Cr was 1.83.    When I saw her in May 2018, she had had subsequent blood work which had revealed improvement in creatinine to 1.5.  I recommended she undergo a 2 year follow-up echo Doppler evaluation of her aortic stenosis.  This was done on 08/10/2007 and showed an EF of 65-70%.  Her mean aortic gradient has increased to 16 and her peak gradient 30.  There was grade 1 diastolic dysfunction, mild MR, mild LA dilation and mild TR.  Her estimated aortic valve area is 1.58 cm.  She denied any chest pain, presyncope or  syncope.    I saw her in October 2018.  She has continued to be followed by Dr. Romero Belling of endocrinology and Dr. Darrick Penna of nephrology.  She has renal insufficiency and her creatinine in August 2018 was 1.7.  Her blood pressure was stable and I recommended reduction of her furosemide to 20 mg from her prior dose of 40 mg.  Any chest pain.  She denies presyncope or syncope.  She tells me her recent hemoglobin A1c was 7.6.  She is  followed by Dr. Cleon Gustin of endocrinology and Dr. Darrick Penna of nephrology. A repeat creatinine in August 2018 was 1.7.    I saw her in the office for evaluation in November 2019.  At that time she denied any chest pain, shortness of breath, presyncope or syncope.  She underwent a repeat echo Doppler study on September 02, 2018 which continued to show normal systolic function with an EF of 55 to 60%, grade 1 diastolic dysfunction, moderate aortic stenosis with a mean gradient of 23 and peak gradient of 37 and mitral annular calcification.  Laboratory from Dr. Darrick Penna in October 2019 revealed a creatinine of 1.64.    She had a telemedicine evaluation with Dr. Kriste Basque, her primary physician in March 2020 and saw Dr. Darrick Penna  and had repeat renal function testing.    I evaluated her on May 12, 2019 in a telemedicine visit. She had remained asymptomatic and specifically denied  any chest pain PND orthopnea, presyncope or syncope.  She denies any palpitations.  At times she notes trivial left ankle edema.    She underwent an echo Doppler study on November 06, 2019 which continued to show an EF of 60 to 65%.  There were no regional wall motion abnormalities.  There was moderate LVH and asymmetric left ventricular hypertrophy.  There was moderate aortic stenosis with a mean gradient of 19 and peak gradient of 36.2.  There was mild PR, trivial MR and TR. I last saw her on November 29, 2019.  At that time she felt well and admitted to just mild episodes of shortness of breath particularly  with walking uphill.  She has had issues with arthritis.  She has continued to see Dr. Cleon Gustin for endocrinologic care and now sees Dr. Monia Pouch for nephrology since Dr. Dr. Darrick Penna is about to retire.   I saw her in August 2021.  She underwent laboratory on June 24, 2020 by Dr. Cleon Gustin which showed a creatinine of 1.42.  She had normal LFTs.  TSH was 1.34.  Total cholesterol 160, triglycerides 74, HDL 48, LDL cholesterol 98.  Laboratory by Dr. Ronalee Belts has shown mild vitamin D insufficiency with a vitamin D level of 23 in April 2021.  Creatinine at that time was further increased at 1.74 which subsequently improved.  She denies any chest tightness.  She denied any dizziness presyncope or syncope.  She continued to experience some mild shortness of breath with uphill walking with this without change.  During that evaluation her blood pressure was stable on amlodipine 5 mg, Bystolic 5 mg twice a day, furosemide 40 mg, and lisinopril 10 mg.  She continued to be on rosuvastatin 40 mg for hyperlipidemia.  She was being followed by Dr. Ronalee Belts for her stage III chronic kidney disease.  I recommended that she undergo a follow-up echo Doppler study for reassessment of her aortic valve prior to her next cardiac evaluation.  On February 21, 2021, an echo Doppler study showed normal LV function with EF 60 to 65%, moderate left ventricular hypertrophy and grade 2 diastolic dysfunction.  She had elevated left atrial pressure.  She was felt to have at least moderate least severe aortic stenosis.  Her mean gradient was 24 mm, peak gradient 40.8 mm and aortic valve area at 0.9 cm.  I  saw her on Mar 25, 2021 at which time she remained stable but had experienced some mild shortness of breath with uphill walking.  She typically walks with a cane.  She is unaware of any presyncope or syncope.  She denies any leg edema.  She had recently seen Dr. Ronalee Belts of nephrology and laboratory was obtained in January 2022.  Creatinine was  1.51 with her estimated GFR at 36 consistent with stage IIIb CKD.    She underwent her wellness visit on November 05, 2021 with her primary provider.  She had a follow-up echo Doppler study in November 25, 2020 which showed an EF of 60 to 65% with mild concentric LVH and grade 1 diastolic dysfunction.  Her aortic valve was trileaflet with severe calcification.  There was moderate aortic stenosis with a mean gradient at 20 and peak gradient of 35.6 mmHg.  Aortic valve area was 1.6 cm.    I saw her on December 04, 2021.  At that time she continued to deny any chest pain.  She walks with a cane.   [facility].  She has CKD and continues to see Dr. Hessie Diener a and her creatinine in November 2022 was 1.8.  She denies chest pain, presyncope or syncope, or any significant dyspnea.  At that time she was asymptomatic with reference to her aortic stenosis.  I recommended that in October 2023 she have a follow-up evaluation for reassessment.  She underwent a 2D echo Doppler study on September 03, 2022.  This showed normal LV function with EF 60 to 65%.  There was grade 1 diastolic dysfunction.  RV pressure was normal.  She was felt to have moderately severe AAS and her mean gradient had increased from 20 mg to 24 mg with her peak gradient now at 36 mm.  I last saw her on November 30, 2022.  Ms. [place] remains asymptomatic.  She denies chest pain presyncope or syncope.  At times she admits to mild shortness of breath with exertion.  [facility] several days per week.  She has also seen Dr. Ronalee Belts with creatinine 1.4 which has been fairly stable.  She is unaware of palpitations.  She presents for reevaluation.  She underwent a follow-up echo Doppler study on Mar 24, 2023 which showed normal LV function with EF 60 to 65%, grade 2 diastolic dysfunction.  Mild LVH. Aortic valve stenosis with mean gradient of 32 peak gradient 53.3 estimated valve area  0.72cm2.  She has remained asymptomatic and close follow-up was recommended.  She was evaluated by Dr. Tonny Bollman on June 01, 2023 for her aortic stenosis and subsequently was referred for cardiac catheterization on August 16, 2023.  Catheterization revealed patent coronary arteries, right dominant with moderate coronary calcification and plaque but without significant obstructive disease.  Her aortic valve was calcified and restricted and was unable to be crossed with a J-wire and JR4 catheter.  On August 30, 2023 she underwent coronary CTA which showed a calcium score 1576.  There is annular measurement support of a 23 mm S3 or 26 mm evolute pro.  She has seen Dr. Bill Salinas and initially was given several dates to undergo TAVR.  She tells me now this is set for most likely October 05, 2023.  She currently denies any chest pain.  She does experience some occasional palpitations.  She is on amlodipine 5 mg, furosemide 20 mg, nebivolol 5 mg twice a day for hypertension, levothyroxine 50 mcg for hypothyroidism, and rosuvastatin 40 mg for hyperlipidemia.  She presents for evaluation.   Past Medical History:  Diagnosis Date   Anemia    History of , resolved   Aortic valve sclerosis    Carpal tunnel syndrome    CHF (congestive heart failure) (HCC)    Chicken pox    Chronic low back pain 02/27/2016   -DDD -seeing [place] [facility]    Diabetes mellitus    type 2, sees Dr. Talmage Nap   Goiter    Dr. Talmage Nap   Heart murmur    History of blood transfusion    auto transfusion post knee replacement   History of nuclear stress test 06/2010   dipyridamole; normal pattern of perfusion; ekg negative for ischemia; low risk scan    Hot flashes    Hyperlipidemia    Hyperparathyroidism (HCC) 02/27/2016   -sees endocrinologist, Dr. Talmage Nap, has seen surgeon as well    Hypertension    Migraine    no longer has migraines (80's) after menopause   Mitral valve prolapse    Neuropathy  Obesity  02/27/2016   Osteoarthritis    DDD, sees [place] [facility]   PMB (postmenopausal bleeding) 02/28/2009   Resolved   Pneumonia    history of    Renal insufficiency    seen at [place] /kidney   Vaginal atrophy    Vitamin D deficiency     Past Surgical History:  Procedure Laterality Date   CARPAL TUNNEL RELEASE Right 04/30/2023   Procedure: RIGHT CARPAL TUNNEL RELEASE;  Surgeon: Betha Loa, MD;  Location: [facility] OR;  Service: Orthopedics;  Laterality: Right;  30 MIN   CESAREAN SECTION  1972 &1976   CHOLECYSTECTOMY  1991   COLONOSCOPY     CORONARY ANGIOGRAPHY N/A 08/16/2023   Procedure: CORONARY ANGIOGRAPHY;  Surgeon: Tonny Bollman, MD;  Location: [facility] INVASIVE CV LAB;  Service: Cardiovascular;  Laterality: N/A;   DILATATION & CURRETTAGE/HYSTEROSCOPY WITH RESECTOCOPE N/A 05/15/2013   Procedure: DILATATION & CURETTAGE/HYSTEROSCOPY WITH RESECTOCOPE;  Surgeon: Hal Morales, MD;  Location: [facility] ORS;  Service: Gynecology;  Laterality: N/A;   DILATATION & CURRETTAGE/HYSTEROSCOPY WITH RESECTOCOPE N/A 06/03/2017   Procedure: DILATATION & CURETTAGE/HYSTEROSCOPY;  Surgeon: Hal Morales, MD;  Location: [facility] ORS;  Service: Gynecology;  Laterality: N/A;   DILATION AND CURETTAGE OF UTERUS  1975   HEMORRHOID SURGERY  1997   KNEE SURGERY  2000 2011   right knee replacement    REPLACEMENT TOTAL KNEE  2000   left   SHOULDER SURGERY  2004 2010    TRANSTHORACIC ECHOCARDIOGRAM  07/2010   EF=>55%; LA mildly dilated; trace MR/TR;   TUBAL LIGATION  1976    Allergies  Allergen Reactions   Lisinopril Hives    Cough, hives, and lip edema    Nsaids Other (See Comments)    Abnormal renal function   Penicillins Rash and Other (See Comments)    Pt has taken Keflex without difficulty Has patient had a PCN reaction causing immediate rash, facial/tongue/throat swelling, SOB or lightheadedness with hypotension: yes Has patient had a PCN reaction causing severe rash involving mucus membranes or skin necrosis: no Has  patient had a PCN reaction that required hospitalization no Has patient had a PCN reaction occurring within the last 10 years: no If all of the above answers are "NO", then may proceed with Cephalosporin use.    Current Outpatient Medications  Medication Sig Dispense Refill   acetaminophen (TYLENOL) 500 MG tablet Take 500 mg by mouth every 6 (six) hours as needed (Arthritis).     amLODipine (NORVASC) 5 MG tablet Take 1 tablet (5 mg total) by mouth daily. 30 tablet 1   aspirin 81 MG EC tablet Take 81 mg by mouth at bedtime. Swallow whole.     cinacalcet (SENSIPAR) 30 MG tablet Take 30 mg by mouth every Monday, Wednesday, and Friday.     furosemide (LASIX) 20 MG tablet Take 40 mg by mouth every morning. Can take additional tablet if needed 30 tablet    gabapentin (NEURONTIN) 100 MG capsule Take 100 mg by mouth at bedtime.     HUMULIN 70/30 KWIKPEN (70-30) 100 UNIT/ML KwikPen Inject 4-8 Units into the skin See admin instructions. 4 units in the AM and 8 units in the pm     levothyroxine (SYNTHROID) 50 MCG tablet Take 50 mcg by mouth daily before breakfast.     nebivolol (BYSTOLIC) 10 MG tablet Take one whole tablet in the morning. Take one half tablet at night. 45 tablet 3   rosuvastatin (CRESTOR) 40 MG tablet TAKE 1 TABLET BY MOUTH  EVERYDAY AT BEDTIME 90 tablet 1   BD PEN NEEDLE NANO 2ND GEN 32G X 4 MM MISC USE AS DIRECTED 90     OneTouch Delica Lancets 33G MISC Apply topically.     ONETOUCH VERIO test strip 1 each 2 (two) times daily.     No current facility-administered medications for this visit.    Social History   Socioeconomic History   Marital status: Divorced    Spouse name: Not on file   Number of children: 2   Years of education: Not on file   Highest education level: Not on file  Occupational History   Not on file  Tobacco Use   Smoking status: Never   Smokeless tobacco: Never  Vaping Use   Vaping status: Never Used  Substance and Sexual Activity   Alcohol use: No     Alcohol/week: 0.0 standard drinks of alcohol   Drug use: No   Sexual activity: Not on file  Other Topics Concern   Not on file  Social History Narrative   Work or School: none      Home Situation: lives alone, feels safe, son lives nearby      Spiritual Beliefs: Christian      Lifestyle: Y 3x per week, healthy diet       01/05/2019: Lives alone in one level house.   Goes to [employer] to do water aerobics 3X/week   Has son and daughter, son local      Social Determinants of Health   Financial Resource Strain: Low Risk  (11/06/2022)   Overall Financial Resource Strain (CARDIA)    Difficulty of Paying Living Expenses: Not hard at all  Food Insecurity: No Food Insecurity (11/06/2022)   Hunger Vital Sign    Worried About Running Out of Food in the Last Year: Never true    Ran Out of Food in the Last Year: Never true  Transportation Needs: No Transportation Needs (11/06/2022)   PRAPARE - Administrator, Civil Service (Medical): No    Lack of Transportation (Non-Medical): No  Physical Activity: Insufficiently Active (11/06/2022)   Exercise Vital Sign    Days of Exercise per Week: 2 days    Minutes of Exercise per Session: 40 min  Stress: No Stress Concern Present (11/06/2022)   [employer] of Occupational Health - Occupational Stress Questionnaire    Feeling of Stress : Not at all  Social Connections: Moderately Integrated (11/06/2022)   Social Connection and Isolation Panel [NHANES]    Frequency of Communication with Friends and Family: More than three times a week    Frequency of Social Gatherings with Friends and Family: More than three times a week    Attends Religious Services: More than 4 times per year    Active Member of [employer] or Organizations: Yes    Attends Banker Meetings: More than 4 times per year    Marital Status: Divorced  Intimate Partner Violence: Not At Risk (11/06/2022)   Humiliation, Afraid, Rape, and Kick questionnaire    Fear  of Current or Ex-Partner: No    Emotionally Abused: No    Physically Abused: No    Sexually Abused: No   Social history is notable that she's divorced has 2 children one grandchild. She does exercise. There is no tobacco or alcohol use..  Family History  Problem Relation Age of Onset   Hypertension Mother    Diabetes Mother    Hyperlipidemia Mother    Heart attack Mother  Heart disease Father    Heart attack Father    Stroke Brother    Heart disease Brother    Diabetes Brother    Uterine cancer Maternal Grandmother     ROS General: Negative; No fevers, chills, or night sweats; No significant change in weight HEENT: Negative; No changes in vision or hearing, sinus congestion, difficulty swallowing Pulmonary: Negative; No cough, wheezing, shortness of breath, hemoptysis Cardiovascular: See history of present illness GI: Negative; No nausea, vomiting, diarrhea, or abdominal pain GU: Negative; No dysuria, hematuria, or difficulty voiding Musculoskeletal: Negative; no myalgias, joint pain, or weakness Hematologic/Oncology: Negative; no easy bruising, bleeding Endocrine: Positive for diabetes mellitus, positive for hyperparathyroidism Neuro: Negative; no changes in balance, headaches Skin: Negative; No rashes or skin lesions Psychiatric: Negative; No behavioral problems, depression Sleep: Negative; No snoring, daytime sleepiness, hypersomnolence, bruxism, restless legs, hypnogognic hallucinations, no cataplexy Other comprehensive 14 point system review is negative.   PE BP 128/80 (BP Location: Left Arm, Patient Position: Sitting, Cuff Size: Normal)   Pulse 91   Ht 5\' 3"  (1.6 m)   Wt 190 lb (86.2 kg)   SpO2 99%   BMI 33.66 kg/m    Repeat blood pressure by me was 128/70  Wt Readings from Last 3 Encounters:  09/13/23 190 lb (86.2 kg)  09/08/23 189 lb (85.7 kg)  09/08/23 189 lb 3.2 oz (85.8 kg)   General: Alert, oriented, no distress.  Skin: normal turgor, no rashes,  warm and dry HEENT: Normocephalic, atraumatic. Pupils equal round and reactive to light; sclera anicteric; extraocular muscles intact;  Nose without nasal septal hypertrophy Mouth/Parynx benign; Mallinpatti scale 3 Neck: No JVD, no carotid bruits; normal carotid upstroke Lungs: clear to ausculatation and percussion; no wheezing or rales Chest wall: without tenderness to palpitation Heart: PMI not displaced, RRR, s1 s2 normal, 3/6 harsh late peaking systolic murmur, no diastolic murmur, no rubs, gallops, thrills, or heaves Abdomen: soft, nontender; no hepatosplenomehaly, BS+; abdominal aorta nontender and not dilated by palpation. Back: no CVA tenderness Pulses 2+ Musculoskeletal: full range of motion, normal strength, no joint deformities Extremities: no clubbing cyanosis or edema, Homan's sign negative  Neurologic: grossly nonfocal; Cranial nerves grossly wnl Psychologic: Normal mood and affect   January 8,2024 ECG (independently read by me): Sinus rhythm at 81, PACs, PVC, 1st degree AV block  December 04, 2021 ECG (independently read by me): Sinus rhythm at 65, 1st degree AV block; PR 280 msec, PACs, LVH  August 2021 ECG (independently read by me): Normal sinus rhythm at 60 bpm.  First-degree AV block with a PR interval at 258 ms.  LVH by voltage in aVL.  No ectopy.  January 2021 ECG (independently read by me): Normal sinus rhythm at 61 bpm with first-degree AV block; PR interval 258 ms.  LVH by voltage criteria in aVL.    November 2019 ECG (independently read by me): Normal sinus rhythm at 61 bpm.  First-degree AV block.  LVH by voltage criteria.  October 2018 ECG (independently read by me): Sinus bradycardia 57 bpm with first-degree AV block.  LVH by voltage criteria.  PR interval 270 ms.  May 2018 ECG (independently read by me): sinus rhythm with first-degree AV block.  LVH by voltage criteria.  PR interval 250 ms.  April 2017 ECG (independently read by me): Normal sinus rhythm at  61 bpm with first-degree AV block with a PR interval of 244 ms.  LVH by voltage.  September 2016 ECG (independently read by me): Normal sinus rhythm  at 60 bpm.  First degree block with a PR interval at 252 ms.  No significant ST-T changes.  October 2015 ECG (independently read by me): Sinus bradycardia at 57 beats per minute.  First degree AV block with a PR interval of 236 ms   September 2014 ECG: Sinus rhythm at 60 beats per minute. First grade AV block with PR interval of 244 ms. QTC interval 356 ms  LABS:    Latest Ref Rng & Units 08/30/2023    9:01 AM 08/09/2023   10:21 AM 04/30/2023    1:33 PM  BMP  Glucose 70 - 99 mg/dL 425  956  387   BUN 8 - 23 mg/dL 36  37  27   Creatinine 0.44 - 1.00 mg/dL 5.64  3.32  9.51   BUN/Creat Ratio 12 - 28  23    Sodium 135 - 145 mmol/L 136  139  137   Potassium 3.5 - 5.1 mmol/L 4.0  4.4  3.8   Chloride 98 - 111 mmol/L 103  102  104   CO2 22 - 32 mmol/L 24  25  23    Calcium 8.9 - 10.3 mg/dL 88.4  16.6  9.8       Latest Ref Rng & Units 11/03/2022   12:00 AM 10/06/2021   12:00 AM 04/10/2021   12:00 AM  Hepatic Function  Albumin 3.5 - 5.0 4.2     4.1     4.1         This result is from an external source.      Latest Ref Rng & Units 08/09/2023   10:21 AM 04/30/2023   12:43 PM 11/03/2022   12:00 AM  CBC  WBC 3.4 - 10.8 x10E3/uL 8.3  8.5  8.7      Hemoglobin 11.1 - 15.9 g/dL 06.3  01.6  01.0      Hematocrit 34.0 - 46.6 % 36.2  38.2  37      Platelets 150 - 450 x10E3/uL 174  181  174         This result is from an external source.   Lab Results  Component Value Date   MCV 97 08/09/2023   MCV 95.5 04/30/2023   MCV 96.1 12/27/2019   Lab Results  Component Value Date   TSH 2.00 01/30/2016   Lab Results  Component Value Date   HGBA1C 7.6 01/05/2023   Lab Results  Component Value Date   CALCIUM 10.0 08/30/2023   Lipid Panel     Component Value Date/Time   CHOL 173 01/05/2023 0000   TRIG 77 01/05/2023 0000   HDL 60 01/05/2023  0000   CHOLHDL 3 11/04/2017 0819   VLDL 18.0 11/04/2017 0819   LDLCALC 95 11/04/2017 0819    RADIOLOGY:   ECHO: 09/03/2022 IMPRESSIONS   1. The aortic valve is tricuspid. There is severe calcifcation of the  aortic valve. There is severe thickening of the aortic valve. Aortic valve  regurgitation is not visualized. Moderate to severe aortic valve stenosis.  Aortic valve mean gradient  measures 24.0 mmHg. Aortic valve Vmax measures 3.00 m/s. AVA by continuity  is 0.9cm2, DI 0.26, SVi 35.2   2. Left ventricular ejection fraction, by estimation, is 60 to 65%. The  left ventricle has normal function. The left ventricle has no regional  wall motion abnormalities. There is moderate basal-septal hypertrophy. The  rest of the LV segments demonstrate   mild left ventricular hypertrophy. Left ventricular diastolic parameters  are consistent with Grade I diastolic dysfunction (impaired relaxation).  The average left ventricular global longitudinal strain is -16.1 %. The  global longitudinal strain is  abnormal.   3. Right ventricular systolic function is normal. The right ventricular  size is normal. There is normal pulmonary artery systolic pressure. The  estimated right ventricular systolic pressure is 17.6 mmHg.   4. Left atrial size was moderately dilated.   5. The mitral valve is degenerative. Trivial mitral valve regurgitation.  Moderate mitral annular calcification.   6. The inferior vena cava is normal in size with greater than 50%  respiratory variability, suggesting right atrial pressure of 3 mmHg.   Comparison(s): Compared to prior TTE in 11/2021, the AS appears moderate to  severe. Mean gradient increased from to and AVA is now  0.9cm2 with DI 0.26.    ECHO: 03/24/2023  1. Left ventricular ejection fraction, by estimation, is 60 to 65%. Left  ventricular ejection fraction by 3D volume is 62 %. The left ventricle has  normal function. The left ventricle has no  regional wall motion  abnormalities. There is mild left  ventricular hypertrophy. Left ventricular diastolic parameters are  consistent with Grade II diastolic dysfunction (pseudonormalization).  Elevated left atrial pressure.   2. Right ventricular systolic function is normal. The right ventricular  size is normal. There is normal pulmonary artery systolic pressure. The  estimated right ventricular systolic pressure is 26.0 mmHg.   3. Right atrial size was mildly dilated.   4. The mitral valve is degenerative. Trivial mitral valve regurgitation.  No evidence of mitral stenosis. Moderate mitral annular calcification.   5. The inferior vena cava is normal in size with greater than 50%  respiratory variability, suggesting right atrial pressure of 3 mmHg.   6. The aortic valve is calcified. There is severe calcifcation of the  aortic valve. Aortic valve regurgitation is not visualized. Severe aortic  valve stenosis. Vmax 4.0 m/s, MG , AVA 0.7 cm^2, DI 0.23   CARDIAC CATH: 08/16/2023 1.  Patent coronary arteries, right dominant, with moderate coronary calcification and mild plaquing but no significant obstructive disease 2.  Calcified, restricted aortic valve, unable to cross with a J-wire and JR4 catheter (known severe AS)   Recommendations: Continue TAVR evaluation       IMPRESSION:  1. Severe aortic stenosis   2. Primary hypertension   3. Grade II diastolic dysfunction   4. Chronic diastolic congestive heart failure (HCC)   5. Hyperlipidemia with target low density lipoprotein (LDL) cholesterol less than 55 mg/dL   6. Stage 3b chronic kidney disease [facility])     ASSESSMENT AND PLAN: Ms. Dowty is a 89 year old African-American female who has a long-standing history of hypertension with grade 1 diastolic dysfunction and normal systolic function documented by her echocardiogram in September 2011. At that time she had aortic valve sclerosis without stenosis and  mitral annular  calcification with trace MR and trace TR. over the years she has had progression of her aortic valve disease.  Her echo Doppler study from 08/23/2015 showed an ejection fraction of 55-60%, mild LVH and grade 1 diastolic dysfunction.  There was very mild aortic stenosis with a mean gradient of 10 and peak gradient of 22.  A 2-year follow-up assessment 08/09/2017 continued to show hyperdynamic LV function with slight increase in her mean gradient at 16 mm with peak of 30 and her October 2019 echo demonstrated mild increase in her gradient with a mean gradient of 23 and  peak gradient of 37 mmHg. Her most recent echo of November 06, 2019 revealed  normal systolic function and essentially was unchanged with reference to the severity of aortic stenosis.  Her mean gradient was 19 with peak gradient 36.2.  Her most recent a echo of April 2022 now shows a mean gradient of 24 mm with a peak gradient of 40.8 mmHg and AVA at 0.9 cm.  Her echo from November 25, 2020 shows stability and her mean gradient has reduced from 24 > 20 mm Hg with peak gradient from 40.8 down to 35.6. consistent with moderate AS.  Over the past year, she has remained asymptomatic with reference to chest pain.  He does experience some very mild shortness of breath particularly with uphill walking.  She denies presyncope or syncope.  [facility] and also does water aerobics.  I reviewed her most recent echo Doppler study from October 2023.  Mean gradient is again 24 mm with peak gradient now at 36 mmHg.  Her blood pressure today on recheck by me was 128/70.  She now has developed severe aortic stenosis with most recent echo on Mar 24, 2023 showing a mean gradient of 32 peak gradient 53 and estimated valve area of 0.72.  She is now seeing Dr. Excell Seltzer for TAVR evaluation.  Cardiac catheterization revealed mild nonobstructive CAD.  She has seen Dr. Evelene Croon for surgical TAVR assessment.  She is now tentatively scheduled  to undergo TAVR using a SAPIEN 3 valve on October 05, 2023.  Her blood pressure today is stable but on recheck by me was 140/76.  She has noticed palpitations and on exam she has several normal beats and then a 4 beat burst of probable SVT and this was occurring in a repetitive pattern.  I am slightly titrating nebivolol from 5 mg twice a day up to 10 mg in the morning and 5 mg at night for improved rate control.  I discussed with her my likely retirement next year.  Following her TAV surgery, she will be followed by the structural heart team and I have suggested that she transition her cardiology care to Dr. Excell Seltzer after my retirement.    Lennette Bihari, MD, [facility]  09/18/2023 10:57 AM

## 2023-09-13 ENCOUNTER — Encounter: Payer: Self-pay | Admitting: Cardiovascular Disease

## 2023-09-13 ENCOUNTER — Ambulatory Visit: Payer: Medicare HMO | Attending: Cardiovascular Disease | Admitting: Cardiovascular Disease

## 2023-09-13 DIAGNOSIS — I10 Essential (primary) hypertension: Secondary | ICD-10-CM

## 2023-09-13 DIAGNOSIS — I350 Nonrheumatic aortic (valve) stenosis: Secondary | ICD-10-CM | POA: Diagnosis not present

## 2023-09-13 DIAGNOSIS — I5032 Chronic diastolic (congestive) heart failure: Secondary | ICD-10-CM | POA: Diagnosis not present

## 2023-09-13 DIAGNOSIS — I5189 Other ill-defined heart diseases: Secondary | ICD-10-CM | POA: Diagnosis not present

## 2023-09-13 DIAGNOSIS — E785 Hyperlipidemia, unspecified: Secondary | ICD-10-CM

## 2023-09-13 DIAGNOSIS — E1159 Type 2 diabetes mellitus with other circulatory complications: Secondary | ICD-10-CM

## 2023-09-13 DIAGNOSIS — N1832 Chronic kidney disease, stage 3b: Secondary | ICD-10-CM

## 2023-09-13 MED ORDER — NEBIVOLOL HCL 10 MG PO TABS
ORAL_TABLET | ORAL | 3 refills | Status: DC
Start: 2023-09-13 — End: 2023-10-06

## 2023-09-13 NOTE — Patient Instructions (Signed)
Medication Instructions:  Take the Nebivolol 10mg  one whole tablet in the morning. Take one half tablet at night.  *If you need a refill on your cardiac medications before your next appointment, please call your pharmacy*   Lab Work: None If you have labs (blood work) drawn today and your tests are completely normal, you will receive your results only by: MyChart Message (if you have MyChart) OR A paper copy in the mail If you have any lab test that is abnormal or we need to change your treatment, we will call you to review the results.   Testing/Procedures: None   Follow-Up: At [facility], you and your health needs are our priority.  As part of our continuing mission to provide you with exceptional heart care, we have created designated Provider Care Teams.  These Care Teams include your primary Cardiologist (physician) and Advanced Practice Providers (APPs -  Physician Assistants and Nurse Practitioners) who all work together to provide you with the care you need, when you need it.  We recommend signing up for the patient portal called "MyChart".  Sign up information is provided on this After Visit Summary.  MyChart is used to connect with patients for Virtual Visits (Telemedicine).  Patients are able to view lab/test results, encounter notes, upcoming appointments, etc.  Non-urgent messages can be sent to your provider as well.   To learn more about what you can do with MyChart, go to [email or web address].    Your next appointment:   6 month(s)  Provider:   Nicki Guadalajara, MD

## 2023-09-15 ENCOUNTER — Other Ambulatory Visit: Payer: Self-pay

## 2023-09-15 DIAGNOSIS — I350 Nonrheumatic aortic (valve) stenosis: Secondary | ICD-10-CM

## 2023-09-18 ENCOUNTER — Encounter: Payer: Self-pay | Admitting: Cardiovascular Disease

## 2023-09-27 ENCOUNTER — Ambulatory Visit (INDEPENDENT_AMBULATORY_CARE_PROVIDER_SITE_OTHER): Payer: Medicare HMO | Admitting: Podiatry

## 2023-09-27 DIAGNOSIS — B351 Tinea unguium: Secondary | ICD-10-CM

## 2023-09-27 DIAGNOSIS — M79674 Pain in right toe(s): Secondary | ICD-10-CM | POA: Diagnosis not present

## 2023-09-27 DIAGNOSIS — M79675 Pain in left toe(s): Secondary | ICD-10-CM

## 2023-09-27 NOTE — Progress Notes (Signed)
Subjective:  Patient ID: Bonnie Chavez, female    DOB: 05/03/1934,  MRN: [id number]  Honey Zakarian presents to clinic today for:  Patient notes nails are thick, discolored, elongated and painful in shoegear when trying to ambulate.  Patient notes she was originally scheduled next week for this care but she is having an aortic valve replacement next week and wanted to have her nail care performed before then.  PCP is Deeann Saint, MD.  Past Medical History:  Diagnosis Date   Anemia    History of , resolved   Aortic valve sclerosis    Carpal tunnel syndrome    CHF (congestive heart failure) (HCC)    Chicken pox    Chronic low back pain 02/27/2016   -DDD -seeing [facility]    Diabetes mellitus    type 2, sees Dr. Talmage Nap   Goiter    Dr. Talmage Nap   Heart murmur    History of blood transfusion    auto transfusion post knee replacement   History of nuclear stress test 06/2010   dipyridamole; normal pattern of perfusion; ekg negative for ischemia; low risk scan    Hot flashes    Hyperlipidemia    Hyperparathyroidism (HCC) 02/27/2016   -sees endocrinologist, Dr. Talmage Nap, has seen surgeon as well    Hypertension    Migraine    no longer has migraines (80's) after menopause   Mitral valve prolapse    Neuropathy    Obesity 02/27/2016   Osteoarthritis    DDD, sees [place] [facility]   PMB (postmenopausal bleeding) 02/28/2009   Resolved   Pneumonia    history of    Renal insufficiency    seen at [place] /kidney   Vaginal atrophy    Vitamin D deficiency     Past Surgical History:  Procedure Laterality Date   CARPAL TUNNEL RELEASE Right 04/30/2023   Procedure: RIGHT CARPAL TUNNEL RELEASE;  Surgeon: Betha Loa, MD;  Location: [facility] OR;  Service: Orthopedics;  Laterality: Right;  30 MIN   CESAREAN SECTION  1972 &1976   CHOLECYSTECTOMY  1991   COLONOSCOPY     CORONARY ANGIOGRAPHY N/A 08/16/2023   Procedure: CORONARY ANGIOGRAPHY;  Surgeon: Tonny Bollman, MD;  Location: [facility]  INVASIVE CV LAB;  Service: Cardiovascular;  Laterality: N/A;   DILATATION & CURRETTAGE/HYSTEROSCOPY WITH RESECTOCOPE N/A 05/15/2013   Procedure: DILATATION & CURETTAGE/HYSTEROSCOPY WITH RESECTOCOPE;  Surgeon: Hal Morales, MD;  Location: [facility] ORS;  Service: Gynecology;  Laterality: N/A;   DILATATION & CURRETTAGE/HYSTEROSCOPY WITH RESECTOCOPE N/A 06/03/2017   Procedure: DILATATION & CURETTAGE/HYSTEROSCOPY;  Surgeon: Hal Morales, MD;  Location: [facility] ORS;  Service: Gynecology;  Laterality: N/A;   DILATION AND CURETTAGE OF UTERUS  1975   HEMORRHOID SURGERY  1997   KNEE SURGERY  2000 2011   right knee replacement    REPLACEMENT TOTAL KNEE  2000   left   SHOULDER SURGERY  2004 2010    TRANSTHORACIC ECHOCARDIOGRAM  07/2010   EF=>55%; LA mildly dilated; trace MR/TR;   TUBAL LIGATION  1976    Allergies  Allergen Reactions   Lisinopril Hives    Cough, hives, and lip edema    Nsaids Other (See Comments)    Abnormal renal function   Penicillins Rash and Other (See Comments)    Pt has taken Keflex without difficulty Has patient had a PCN reaction causing immediate rash, facial/tongue/throat swelling, SOB or lightheadedness with hypotension: yes Has patient had a PCN reaction causing severe  rash involving mucus membranes or skin necrosis: no Has patient had a PCN reaction that required hospitalization no Has patient had a PCN reaction occurring within the last 10 years: no If all of the above answers are "NO", then may proceed with Cephalosporin use.    Review of Systems: Negative except as noted in the HPI.  Objective:  Ivorie Uplinger is a pleasant 89 y.o. female in NAD. AAO x 3.  Vascular Examination: Capillary refill time is 3-5 seconds to toes bilateral. Palpable pedal pulses b/l LE. Digital hair present b/l.  Skin temperature gradient WNL b/l. No varicosities b/l. No cyanosis noted b/l.   Dermatological Examination: Pedal skin with normal turgor, texture and tone b/l. No open  wounds. No interdigital macerations b/l. Toenails x10 are 3mm thick, discolored, dystrophic with subungual debris. There is pain with compression of the nail plates.  They are elongated x10     01/05/2023   12:00 AM  Hemoglobin A1C  Hemoglobin-A1c 7.6         This result is from an external source.   Assessment/Plan: 1. Dermatophytosis of nail   2. Pain in toes of both feet    The mycotic toenails were sharply debrided x10 with sterile nail nippers and a power debriding burr to decrease bulk/thickness and length.    Return in about 3 months (around 12/28/2023) for [facility].   Clerance Lav, DPM, FACFAS [facility]     [address] [place], [place] [address]                Office [phone]  Fax [phone]

## 2023-10-01 ENCOUNTER — Other Ambulatory Visit: Payer: Self-pay

## 2023-10-01 ENCOUNTER — Encounter (HOSPITAL_COMMUNITY)
Admission: RE | Admit: 2023-10-01 | Discharge: 2023-10-01 | Disposition: A | Discharge status: Home / Self Care | Payer: Medicare HMO | Source: Ambulatory Visit | Attending: Cardiovascular Disease | Admitting: Cardiovascular Disease

## 2023-10-01 ENCOUNTER — Ambulatory Visit (HOSPITAL_COMMUNITY)
Admission: RE | Admit: 2023-10-01 | Discharge: 2023-10-01 | Disposition: A | Discharge status: Home / Self Care | Payer: Medicare HMO | Source: Ambulatory Visit | Attending: Cardiovascular Disease | Admitting: Cardiovascular Disease

## 2023-10-01 DIAGNOSIS — Z01812 Encounter for preprocedural laboratory examination: Secondary | ICD-10-CM | POA: Insufficient documentation

## 2023-10-01 DIAGNOSIS — Z1152 Encounter for screening for COVID-19: Secondary | ICD-10-CM | POA: Insufficient documentation

## 2023-10-01 DIAGNOSIS — Z01818 Encounter for other preprocedural examination: Secondary | ICD-10-CM

## 2023-10-01 DIAGNOSIS — I350 Nonrheumatic aortic (valve) stenosis: Secondary | ICD-10-CM | POA: Diagnosis not present

## 2023-10-01 LAB — URINALYSIS, ROUTINE W REFLEX MICROSCOPIC
Bilirubin Urine: NEGATIVE
Glucose, UA: NEGATIVE mg/dL
Ketones, ur: NEGATIVE mg/dL
Nitrite: NEGATIVE
Protein, ur: NEGATIVE mg/dL
Specific Gravity, Urine: 1.004 — ABNORMAL LOW (ref 1.005–1.030)
pH: 6 (ref 5.0–8.0)

## 2023-10-01 LAB — CBC
HCT: 37.8 % (ref 36.0–46.0)
Hemoglobin: 12.5 g/dL (ref 12.0–15.0)
MCH: 31.6 pg (ref 26.0–34.0)
MCHC: 33.1 g/dL (ref 30.0–36.0)
MCV: 95.5 fL (ref 80.0–100.0)
Platelets: 156 10*3/uL (ref 150–400)
RBC: 3.96 MIL/uL (ref 3.87–5.11)
RDW: 12.9 % (ref 11.5–15.5)
WBC: 6.6 10*3/uL (ref 4.0–10.5)
nRBC: 0 % (ref 0.0–0.2)

## 2023-10-01 LAB — TYPE AND SCREEN
ABO/RH(D): A POS
Antibody Screen: NEGATIVE

## 2023-10-01 LAB — COMPREHENSIVE METABOLIC PANEL
ALT: 22 U/L (ref 0–44)
AST: 30 U/L (ref 15–41)
Albumin: 3.5 g/dL (ref 3.5–5.0)
Alkaline Phosphatase: 73 U/L (ref 38–126)
Anion gap: 11 (ref 5–15)
BUN: 40 mg/dL — ABNORMAL HIGH (ref 8–23)
CO2: 25 mmol/L (ref 22–32)
Calcium: 10.3 mg/dL (ref 8.9–10.3)
Chloride: 103 mmol/L (ref 98–111)
Creatinine, Ser: 1.64 mg/dL — ABNORMAL HIGH (ref 0.44–1.00)
GFR, Estimated: 30 mL/min — ABNORMAL LOW (ref >60)
Glucose, Bld: 218 mg/dL — ABNORMAL HIGH (ref 70–99)
Potassium: 4.2 mmol/L (ref 3.5–5.1)
Sodium: 139 mmol/L (ref 135–145)
Total Bilirubin: 0.6 mg/dL (ref <1.2)
Total Protein: 8.2 g/dL — ABNORMAL HIGH (ref 6.5–8.1)

## 2023-10-01 LAB — SURGICAL PCR SCREEN
MRSA, PCR: NEGATIVE
Staphylococcus aureus: NEGATIVE

## 2023-10-01 LAB — PROTIME-INR
INR: 1 (ref 0.8–1.2)
Prothrombin Time: 12.9 s (ref 11.4–15.2)

## 2023-10-01 NOTE — Progress Notes (Signed)
Patient signed all consents at PAT lab appointment. CHG soap and instructions were given to patient. CHG surgical prep reviewed with patient and all questions answered.  Patients chart send to anesthesia for review. Pt denies any respiratory illness/infection in the last two months.

## 2023-10-02 LAB — SARS CORONAVIRUS 2 (TAT 6-24 HRS): SARS Coronavirus 2: NEGATIVE

## 2023-10-04 MED ORDER — MAGNESIUM SULFATE 50 % IJ SOLN
40.0000 meq | INTRAMUSCULAR | Status: DC
  Filled 2023-10-04: qty 9.85

## 2023-10-04 MED ORDER — CEFAZOLIN SODIUM-DEXTROSE 2-4 GM/100ML-% IV SOLN
2.0000 g | INTRAVENOUS | Status: AC
  Administered 2023-10-05: 2 g via INTRAVENOUS
  Filled 2023-10-04 (×2): qty 100

## 2023-10-04 MED ORDER — POTASSIUM CHLORIDE 2 MEQ/ML IV SOLN
80.0000 meq | INTRAVENOUS | Status: DC
  Filled 2023-10-04: qty 40

## 2023-10-04 MED ORDER — HEPARIN 30,000 UNITS/1000 ML (OHS) CELLSAVER SOLUTION
Status: DC
  Filled 2023-10-04: qty 1000

## 2023-10-04 MED ORDER — DEXMEDETOMIDINE HCL IN NACL 400 MCG/100ML IV SOLN
0.1000 ug/kg/h | INTRAVENOUS | Status: AC
  Administered 2023-10-05: 8 ug via INTRAVENOUS
  Administered 2023-10-05: 4 ug via INTRAVENOUS
  Administered 2023-10-05: 8 ug via INTRAVENOUS
  Filled 2023-10-04: qty 100

## 2023-10-04 MED ORDER — NOREPINEPHRINE 4 MG/250ML-% IV SOLN
0.0000 ug/min | INTRAVENOUS | Status: DC
  Filled 2023-10-04: qty 250

## 2023-10-04 NOTE — H&P (Signed)
[address]       [place] [address]             [phone]      Cardiothoracic Surgery Admission History and Physical   PCP is Deeann Saint, MD Referring Provider is Tonny Bollman, MD Primary Cardiologist is Nicki Guadalajara, MD   Reason for admission: Severe aortic stenosis   HPI:   The patient is an 89 year old woman with history of type 2 diabetes, hypertension, hyperlipidemia, chronic kidney disease, degenerative arthritis status post bilateral knee and shoulder replacements, severe aortic stenosis, and chronic diastolic heart failure who was referred for evaluation for TAVR.  She reports progressive exertional fatigue and shortness of breath over the past year.  She is able to do some activities around the house but nothing strenuous.  She was going to the [facility] 3 days/week to do water aerobics but then decreased that to 2 times a week and now is not going at all.  Her most recent echocardiogram on 03/24/2023 showed a severely calcified and thickened aortic valve with restricted leaflet mobility.  The mean gradient had increased to 32 mmHg with a peak of 53 mmHg.  Valve area by VTI was 0.72 cm.  Dimensionless index was 0.23.   She lives alone.  She has a son who lives a few minutes away.  She has another son who lives in [place].       Past Medical History:  Diagnosis Date   Anemia      History of , resolved   Aortic valve sclerosis     Carpal tunnel syndrome     CHF (congestive heart failure) (HCC)     Chicken pox     Chronic low back pain 02/27/2016    -DDD -seeing [facility]    Diabetes mellitus      type 2, sees Dr. Talmage Nap   Goiter      Dr. Talmage Nap   Heart murmur     History of blood transfusion      auto transfusion post knee replacement   History of nuclear stress test 06/2010    dipyridamole; normal pattern of perfusion; ekg negative for ischemia; low risk scan    Hot flashes     Hyperlipidemia     Hyperparathyroidism (HCC)  02/27/2016    -sees endocrinologist, Dr. Talmage Nap, has seen surgeon as well    Hypertension     Migraine      no longer has migraines (80's) after menopause   Mitral valve prolapse     Neuropathy     Obesity 02/27/2016   Osteoarthritis      DDD, sees [facility]   PMB (postmenopausal bleeding) 02/28/2009    Resolved   Pneumonia      history of    Renal insufficiency      seen at [place] /kidney   Vaginal atrophy     Vitamin D deficiency                 Past Surgical History:  Procedure Laterality Date   CARPAL TUNNEL RELEASE Right 04/30/2023    Procedure: RIGHT CARPAL TUNNEL RELEASE;  Surgeon: Betha Loa, MD;  Location: [facility] OR;  Service: Orthopedics;  Laterality: Right;  30 MIN   CESAREAN SECTION   1972 &1976   CHOLECYSTECTOMY   1991   COLONOSCOPY       CORONARY ANGIOGRAPHY N/A 08/16/2023    Procedure: CORONARY ANGIOGRAPHY;  Surgeon: Tonny Bollman, MD;  Location: [facility] INVASIVE CV LAB;  Service: Cardiovascular;  Laterality: N/A;   DILATATION & CURRETTAGE/HYSTEROSCOPY WITH RESECTOCOPE N/A 05/15/2013    Procedure: DILATATION & CURETTAGE/HYSTEROSCOPY WITH RESECTOCOPE;  Surgeon: Hal Morales, MD;  Location: [facility] ORS;  Service: Gynecology;  Laterality: N/A;   DILATATION & CURRETTAGE/HYSTEROSCOPY WITH RESECTOCOPE N/A 06/03/2017    Procedure: DILATATION & CURETTAGE/HYSTEROSCOPY;  Surgeon: Hal Morales, MD;  Location: [facility] ORS;  Service: Gynecology;  Laterality: N/A;   DILATION AND CURETTAGE OF UTERUS   1975   HEMORRHOID SURGERY   1997   KNEE SURGERY   2000 2011    right knee replacement    REPLACEMENT TOTAL KNEE   2000    left   SHOULDER SURGERY   2004 2010    TRANSTHORACIC ECHOCARDIOGRAM   07/2010    EF=>55%; LA mildly dilated; trace MR/TR;   TUBAL LIGATION   1976               Family History  Problem Relation Age of Onset   Hypertension Mother     Diabetes Mother     Hyperlipidemia Mother     Heart attack Mother     Heart disease Father     Heart attack Father      Stroke Brother     Heart disease Brother     Diabetes Brother     Uterine cancer Maternal Grandmother            Social History         Socioeconomic History   Marital status: Divorced      Spouse name: Not on file   Number of children: 2   Years of education: Not on file   Highest education level: Not on file  Occupational History   Not on file  Tobacco Use   Smoking status: Never   Smokeless tobacco: Never  Vaping Use   Vaping status: Never Used  Substance and Sexual Activity   Alcohol use: No      Alcohol/week: 0.0 standard drinks of alcohol   Drug use: No   Sexual activity: Not on file  Other Topics Concern   Not on file  Social History Narrative    Work or School: none         Home Situation: lives alone, feels safe, son lives nearby         Spiritual Beliefs: Christian         Lifestyle: Y 3x per week, healthy diet          01/05/2019: Lives alone in one level house.    Goes to [employer] to do water aerobics 3X/week    Has son and daughter, son local         Social Determinants of Health        Financial Resource Strain: Low Risk  (11/06/2022)    Overall Financial Resource Strain (CARDIA)     Difficulty of Paying Living Expenses: Not hard at all  Food Insecurity: No Food Insecurity (11/06/2022)    Hunger Vital Sign     Worried About Running Out of Food in the Last Year: Never true     Ran Out of Food in the Last Year: Never true  Transportation Needs: No Transportation Needs (11/06/2022)    PRAPARE - Therapist, art (Medical): No     Lack of Transportation (Non-Medical): No  Physical Activity: Insufficiently Active (11/06/2022)    Exercise Vital Sign     Days  of Exercise per Week: 2 days     Minutes of Exercise per Session: 40 min  Stress: No Stress Concern Present (11/06/2022)    [employer] of Occupational Health - Occupational Stress Questionnaire     Feeling of Stress : Not at all  Social Connections: Moderately  Integrated (11/06/2022)    Social Connection and Isolation Panel [NHANES]     Frequency of Communication with Friends and Family: More than three times a week     Frequency of Social Gatherings with Friends and Family: More than three times a week     Attends Religious Services: More than 4 times per year     Active Member of [employer] or Organizations: Yes     Attends Banker Meetings: More than 4 times per year     Marital Status: Divorced  Intimate Partner Violence: Not At Risk (11/06/2022)    Humiliation, Afraid, Rape, and Kick questionnaire     Fear of Current or Ex-Partner: No     Emotionally Abused: No     Physically Abused: No     Sexually Abused: No             Prior to Admission medications   Medication Sig Start Date End Date Taking? Authorizing Provider  acetaminophen (TYLENOL) 500 MG tablet Take 500 mg by mouth every 6 (six) hours as needed (Arthritis).     Yes [provider]  amLODipine (NORVASC) 5 MG tablet Take 1 tablet (5 mg total) by mouth daily. 05/04/22   Yes Burchette, Elberta Fortis, MD  aspirin 81 MG EC tablet Take 81 mg by mouth at bedtime. Swallow whole.     Yes [provider]  BD PEN NEEDLE NANO 2ND GEN 32G X 4 MM MISC USE AS DIRECTED 90 07/24/20   Yes [provider]  cinacalcet (SENSIPAR) 30 MG tablet Take 30 mg by mouth every Monday, Wednesday, and Friday. 11/01/19   Yes [provider]  furosemide (LASIX) 20 MG tablet Take 40 mg by mouth every morning. Can take additional tablet if needed 05/19/18   Yes Kriste Basque R, DO  gabapentin (NEURONTIN) 100 MG capsule Take 100 mg by mouth at bedtime.     Yes [provider]  HUMULIN 70/30 KWIKPEN (70-30) 100 UNIT/ML KwikPen Inject 4-8 Units into the skin See admin instructions. 4 units in the AM and 8 units in the pm 02/24/21   Yes [provider]  levothyroxine (SYNTHROID) 50 MCG tablet Take 50 mcg by mouth daily before breakfast.     Yes [provider]   nebivolol (BYSTOLIC) 10 MG tablet TAKE 0.5 TABLETS BY MOUTH 2 TIMES DAILY. Patient taking differently: Take 10 mg by mouth daily. 06/04/23   Yes Lennette Bihari, MD  OneTouch Delica Lancets 33G MISC Apply topically. 08/12/20   Yes [provider]  ONETOUCH VERIO test strip 1 each 2 (two) times daily. 10/27/20   Yes [provider]  rosuvastatin (CRESTOR) 40 MG tablet TAKE 1 TABLET BY MOUTH EVERYDAY AT BEDTIME 08/13/23   Yes Lennette Bihari, MD            Current Outpatient Medications  Medication Sig Dispense Refill   acetaminophen (TYLENOL) 500 MG tablet Take 500 mg by mouth every 6 (six) hours as needed (Arthritis).       amLODipine (NORVASC) 5 MG tablet Take 1 tablet (5 mg total) by mouth daily. 30 tablet 1   aspirin 81 MG EC tablet Take 81 mg  by mouth at bedtime. Swallow whole.       BD PEN NEEDLE NANO 2ND GEN 32G X 4 MM MISC USE AS DIRECTED 90       cinacalcet (SENSIPAR) 30 MG tablet Take 30 mg by mouth every Monday, Wednesday, and Friday.       furosemide (LASIX) 20 MG tablet Take 40 mg by mouth every morning. Can take additional tablet if needed 30 tablet     gabapentin (NEURONTIN) 100 MG capsule Take 100 mg by mouth at bedtime.       HUMULIN 70/30 KWIKPEN (70-30) 100 UNIT/ML KwikPen Inject 4-8 Units into the skin See admin instructions. 4 units in the AM and 8 units in the pm       levothyroxine (SYNTHROID) 50 MCG tablet Take 50 mcg by mouth daily before breakfast.       nebivolol (BYSTOLIC) 10 MG tablet TAKE 0.5 TABLETS BY MOUTH 2 TIMES DAILY. (Patient taking differently: Take 10 mg by mouth daily.) 90 tablet 3   OneTouch Delica Lancets 33G MISC Apply topically.       ONETOUCH VERIO test strip 1 each 2 (two) times daily.       rosuvastatin (CRESTOR) 40 MG tablet TAKE 1 TABLET BY MOUTH EVERYDAY AT BEDTIME 90 tablet 1      No current facility-administered medications for this visit.        Allergies       Allergies  Allergen Reactions   Lisinopril Hives       Cough, hives, and lip edema     Nsaids Other (See Comments)      Abnormal renal function   Penicillins Rash and Other (See Comments)      Pt has taken Keflex without difficulty Has patient had a PCN reaction causing immediate rash, facial/tongue/throat swelling, SOB or lightheadedness with hypotension: yes Has patient had a PCN reaction causing severe rash involving mucus membranes or skin necrosis: no Has patient had a PCN reaction that required hospitalization no Has patient had a PCN reaction occurring within the last 10 years: no If all of the above answers are "NO", then may proceed with Cephalosporin use.            Review of Systems:               General:                      Normal appetite, + decreased energy, no weight gain, no weight loss, no fever             Cardiac:                       no chest pain with exertion, no chest pain at rest, +SOB with mild exertion, no resting SOB, no PND, no orthopnea, no palpitations, no arrhythmia, no atrial fibrillation, + LE edema, no dizzy spells, no syncope             Respiratory:                 + exertional shortness of breath, no home oxygen, no productive cough, + dry cough, no bronchitis, no wheezing, no hemoptysis, no asthma, no pain with inspiration or cough, no sleep apnea, no CPAP at night             GI:  no difficulty swallowing, no reflux, no frequent heartburn, no hiatal hernia, no abdominal pain, no constipation, no diarrhea, no hematochezia, no hematemesis, no melena             GU:                              no dysuria,  no frequency, no urinary tract infection, no hematuria, no kidney stones, + chronic kidney disease             Vascular:                     no pain suggestive of claudication, no pain in feet, + leg cramps, + varicose veins, no DVT, no non-healing foot ulcer             Neuro:                         no stroke, no TIA's, no seizures, no headaches, no temporary blindness one  eye,  no slurred speech, + peripheral neuropathy, no chronic pain, no instability of gait, no memory/cognitive dysfunction             Musculoskeletal:         + arthritis - primarily involving the knees and shoulders, + joint swelling, no myalgias, no difficulty walking, normal mobility              Skin:                            no rash, no itching, no skin infections, no pressure sores or ulcerations             Psych:                         no anxiety, no depression, no nervousness, no unusual recent stress             Eyes:                           no blurry vision, no floaters, no recent vision changes, + wears glasses              ENT:                            no hearing loss, no loose or painful teeth, no dentures, last saw dentist every 6 months             Hematologic:               no easy bruising, no abnormal bleeding, no clotting disorder, no frequent epistaxis             Endocrine:                   + diabetes, does check CBG's at home                            Physical Exam:               BP (!) 146/81 (BP Location: Left Arm, Patient Position: Sitting)   Pulse 83   Resp 18   Ht 5\' 3"  (1.6 m)  Wt 189 lb (85.7 kg)   SpO2 97% Comment: RA  BMI 33.48 kg/m              General:                      Elderly,  well-appearing             HEENT:                       Unremarkable, NCAT, PERLA, EOMI             Neck:                           no JVD, no bruits, no adenopathy              Chest:                          clear to auscultation, symmetrical breath sounds, no wheezes, no rhonchi              CV:                              RRR, 3/6 systolic murmur RSB, no diastolic murmur             Abdomen:                    soft, non-tender, no masses              Extremities:                 warm, well-perfused, pedal pulses palpable, mild bilateral lower extremity edema             Rectal/GU                   Deferred             Neuro:                         Grossly  non-focal and symmetrical throughout             Skin:                            Clean and dry, no rashes, no breakdown   Diagnostic Tests:   ECHOCARDIOGRAM REPORT       Patient Name:   ODENA MONTAN Date of Exam: 03/24/2023  Medical Rec #:  [id number]     Height:       63.0 in  Accession #:    [id number]    Weight:       190.2 lb  Date of Birth:  [birth year]      BSA:          1.893 m  Patient Age:    88 years      BP:           118/58 mmHg  Patient Gender: F             HR:           58 bpm.  Exam Location:  [address]   Procedure: 2D Echo, Cardiac Doppler, Color Doppler and 3D Echo   Indications:    Aortic  Stenosis I35.0    History:        Patient has prior history of Echocardiogram examinations,  most                 recent 09/03/2022. Risk Factors:Hypertension, Dyslipidemia  and                 Diabetes.    Sonographer:    Thurman Coyer RDCS  Referring Phys: [id number] THOMAS A KELLY   IMPRESSIONS     1. Left ventricular ejection fraction, by estimation, is 60 to 65%. Left  ventricular ejection fraction by 3D volume is 62 %. The left ventricle has  normal function. The left ventricle has no regional wall motion  abnormalities. There is mild left  ventricular hypertrophy. Left ventricular diastolic parameters are  consistent with Grade II diastolic dysfunction (pseudonormalization).  Elevated left atrial pressure.   2. Right ventricular systolic function is normal. The right ventricular  size is normal. There is normal pulmonary artery systolic pressure. The  estimated right ventricular systolic pressure is 26.0 mmHg.   3. Right atrial size was mildly dilated.   4. The mitral valve is degenerative. Trivial mitral valve regurgitation.  No evidence of mitral stenosis. Moderate mitral annular calcification.   5. The inferior vena cava is normal in size with greater than 50%  respiratory variability, suggesting right atrial pressure of 3 mmHg.   6. The aortic valve is  calcified. There is severe calcifcation of the  aortic valve. Aortic valve regurgitation is not visualized. Severe aortic  valve stenosis. Vmax 4.0 m/s, MG , AVA 0.7 cm^2, DI 0.23   FINDINGS   Left Ventricle: Left ventricular ejection fraction, by estimation, is 60  to 65%. Left ventricular ejection fraction by 3D volume is 62 %. The left  ventricle has normal function. The left ventricle has no regional wall  motion abnormalities. The left  ventricular internal cavity size was normal in size. There is mild left  ventricular hypertrophy. Left ventricular diastolic parameters are  consistent with Grade II diastolic dysfunction (pseudonormalization).  Elevated left atrial pressure.   Right Ventricle: The right ventricular size is normal. No increase in  right ventricular wall thickness. Right ventricular systolic function is  normal. There is normal pulmonary artery systolic pressure. The tricuspid  regurgitant velocity is 2.40 m/s, and   with an assumed right atrial pressure of 3 mmHg, the estimated right  ventricular systolic pressure is 26.0 mmHg.   Left Atrium: Left atrial size was normal in size.   Right Atrium: Right atrial size was mildly dilated.   Pericardium: There is no evidence of pericardial effusion.   Mitral Valve: The mitral valve is degenerative in appearance. Moderate  mitral annular calcification. Trivial mitral valve regurgitation. No  evidence of mitral valve stenosis.   Tricuspid Valve: The tricuspid valve is normal in structure. Tricuspid  valve regurgitation is trivial.   Aortic Valve: The aortic valve is calcified. There is severe calcifcation  of the aortic valve. Aortic valve regurgitation is not visualized. Severe  aortic stenosis is present. Aortic valve mean gradient measures 32.0 mmHg.  Aortic valve peak gradient  measures 53.3 mmHg. Aortic valve area, by VTI measures 0.72 cm.   Pulmonic Valve: The pulmonic valve was not well visualized.  Pulmonic valve  regurgitation is trivial.   Aorta: The aortic root and ascending aorta are structurally normal, with  no evidence of dilitation.   Venous: The inferior vena cava is normal in size with greater than  50%  respiratory variability, suggesting right atrial pressure of 3 mmHg.   IAS/Shunts: The interatrial septum was not well visualized.     LEFT VENTRICLE  PLAX 2D  LVIDd:         4.60 cm         Diastology  LVIDs:         2.90 cm         LV e' medial:    3.75 cm/s  LV PW:         1.20 cm         LV E/e' medial:  26.2  LV IVS:        1.20 cm         LV e' lateral:   5.78 cm/s  LVOT diam:     2.00 cm         LV E/e' lateral: 17.0  LV SV:         65  LV SV Index:   34  LVOT Area:     3.14 cm        3D Volume EF                                 LV 3D EF:    Left                                              ventricul                                              ar                                              ejection                                              fraction                                              by 3D                                              volume is                                              62 %.                                   3D Volume EF:  3D EF:        62 %                                 LV EDV:       109 ml                                 LV ESV:       41 ml                                 LV SV:        68 ml   RIGHT VENTRICLE  RV Basal diam:  3.60 cm  RV Mid diam:    2.90 cm  RV S prime:     12.50 cm/s  TAPSE (M-mode): 2.9 cm   LEFT ATRIUM             Index        RIGHT ATRIUM           Index  LA diam:        4.30 cm 2.27 cm/m   RA Area:     23.30 cm  LA Vol (A2C):   42.5 ml 22.45 ml/m  RA Volume:   68.10 ml  35.98 ml/m  LA Vol (A4C):   73.8 ml 38.99 ml/m  LA Biplane Vol: 55.0 ml 29.06 ml/m   AORTIC VALVE  AV Area (Vmax):    0.66 cm  AV Area (Vmean):   0.62 cm  AV Area (VTI):     0.72  cm  AV Vmax:           365.00 cm/s  AV Vmean:          269.667 cm/s  AV VTI:            0.902 m  AV Peak Grad:      53.3 mmHg  AV Mean Grad:      32.0 mmHg  LVOT Vmax:         76.70 cm/s  LVOT Vmean:        53.600 cm/s  LVOT VTI:          0.207 m  LVOT/AV VTI ratio: 0.23    AORTA  Ao Root diam: 2.80 cm  Ao Asc diam:  3.40 cm   MITRAL VALVE               TRICUSPID VALVE  MV Area (PHT): 3.37 cm    TR Peak grad:   23.0 mmHg  MV Decel Time: 225 msec    TR Vmax:        240.00 cm/s  MV E velocity: 98.10 cm/s  MV A velocity: 92.00 cm/s  SHUNTS  MV E/A ratio:  1.07        Systemic VTI:  0.21 m                             Systemic Diam: 2.00 cm   Epifanio Lesches MD  Electronically signed by Epifanio Lesches MD  Signature Date/Time: 03/26/2023/10:26:13 AM      Physicians   Panel Physicians Referring Physician Case Authorizing Physician  Tonny Bollman, MD (Primary)        Procedures   CORONARY  ANGIOGRAPHY    Conclusion   1.  Patent coronary arteries, right dominant, with moderate coronary calcification and mild plaquing but no significant obstructive disease 2.  Calcified, restricted aortic valve, unable to cross with a J-wire and JR4 catheter (known severe AS)   Recommendations: Continue TAVR evaluation   Indications   Severe aortic stenosis [I35.0 (ICD-10-CM)]    Procedural Details   Technical Details INDICATION: severe aortic stenosis (preoperative study)  PROCEDURAL DETAILS: The right wrist is prepped, draped, and anesthetized with 1% lidocaine. Using the modified Seldinger technique, a 5/6 French Slender sheath is introduced into the right radial artery. 3 mg of verapamil is administered through the sheath, weight-based unfractionated heparin was administered intravenously. Standard Judkins catheters are used for selective coronary angiography. Catheter exchanges are performed over an exchange length guidewire. There are no immediate procedural complications.  A TR band is used for radial hemostasis at the completion of the procedure.  The patient was transferred to the post catheterization recovery area for further monitoring.      Estimated blood loss <50 mL.   During this procedure medications were administered to achieve and maintain moderate conscious sedation while the patient's heart rate, blood pressure, and oxygen saturation were continuously monitored and I was present face-to-face 100% of this time.    Medications (Filter: Administrations occurring from 1019 to 1104 on 08/16/23)  important  Continuous medications are totaled by the amount administered until 08/16/23 1104.    Heparin (Porcine) in NaCl 1000-0.9 UT/500ML-% SOLN (mL)  Total volume: 1,000 mL Date/Time Rate/Dose/Volume Action    08/16/23 1030 500 mL Given    1030 500 mL Given    fentaNYL (SUBLIMAZE) injection (mcg)  Total dose: 25 mcg Date/Time Rate/Dose/Volume Action    08/16/23 1037 25 mcg Given    midazolam (VERSED) injection (mg)  Total dose: 1 mg Date/Time Rate/Dose/Volume Action    08/16/23 1037 1 mg Given    lidocaine (PF) (XYLOCAINE) 1 % injection (mL)  Total volume: 2 mL Date/Time Rate/Dose/Volume Action    08/16/23 1039 2 mL Given    Radial Cocktail/Verapamil only (mL)  Total volume: 10 mL Date/Time Rate/Dose/Volume Action    08/16/23 1040 10 mL Given    heparin sodium (porcine) injection (Units)  Total dose: 5,000 Units Date/Time Rate/Dose/Volume Action    08/16/23 1044 5,000 Units Given    iohexol (OMNIPAQUE) 350 MG/ML injection (mL)  Total volume: 30 mL Date/Time Rate/Dose/Volume Action    08/16/23 1052 30 mL Given      Sedation Time   Sedation Time Physician-1: 13 minutes 1 second Contrast        Administrations occurring from 1019 to 1104 on 08/16/23:  Medication Name Total Dose  iohexol (OMNIPAQUE) 350 MG/ML injection 30 mL    Radiation/Fluoro   Fluoro time: 2.5 (min) DAP: 12081 (mGycm2) Cumulative Air Kerma: 227 (mGy) Coronary  Findings   Diagnostic Dominance: Right Left Main  The vessel exhibits minimal luminal irregularities. The left main is patent and it divides into the LAD and left circumflex. There is no intermediate branch present.    Left Anterior Descending  There is mild diffuse disease throughout the vessel. The LAD is calcified. The vessel is large in caliber and wraps around the LV apex. The proximal vessel is patent with no stenosis. Around the first diagonal and just beyond there is mild 30% stenosis present. Otherwise vessel has no significant obstruction.  Mid LAD lesion is 30% stenosed. The lesion is moderately calcified.    Left  Circumflex  The vessel exhibits minimal luminal irregularities. The circumflex is patent with mild nonobstructive plaquing in the proximal portion with no more than 20 to 25% stenosis. The obtuse marginal branches are patent with no stenosis.    Right Coronary Artery  There is mild diffuse disease throughout the vessel. The vessel is moderately calcified. This is a large, dominant vessel. The vessel supplies a PDA branch and large posterolateral branch. The entire RCA is calcified. There is mild nonobstructive plaquing with a 30% proximal stenosis, nonobstructive 25 to 30% stenosis through the mid RCA, and patency of the distal RCA as well as its branch vessels. There are no flow-limiting lesions seen throughout the RCA distribution.  Prox RCA lesion is 30% stenosed.  Mid RCA lesion is 30% stenosed.    Intervention    No interventions have been documented.    Coronary Diagrams   Diagnostic Dominance: Right  Intervention    Implants    No implant documentation for this case.    Syngo Images    Show images for CARDIAC CATHETERIZATION Images on Long Term Storage    Show images for Benji, Caballeros to Procedure Log   Procedure Log    Link to Procedure Log   Procedure Log    Hemo Data   Flowsheet Row Most Recent Value  AO Systolic Pressure 101 mmHg   AO Diastolic Pressure 69 mmHg  AO Mean 72 mmHg      Final        Narrative & Impression  CLINICAL DATA:  Severe Aortic Stenosis.   EXAM: Cardiac TAVR CT   TECHNIQUE: A non-contrast, gated CT scan was obtained with axial slices of 3 mm through the heart for aortic valve calcium scoring. A 120 kV retrospective, gated, contrast cardiac scan was obtained. Gantry rotation speed was 250 msecs and collimation was 0.6 mm. Nitroglycerin was not given. The 3D data set was reconstructed in 5% intervals of the 0-95% of the R-R cycle. Systolic and diastolic phases were analyzed on a dedicated workstation using MPR, MIP, and VRT modes. The patient received 100 cc of contrast.   FINDINGS: Image quality: Average. Motion artifact noted. Millisecond reconstruction performed for TAVR assessment.   Noise artifact is: Limited.   Valve Morphology: Tricuspid aortic valve. Severely calcified leaflets with severely restricted leaflet movement in systole. Bulky calcification of the NCC.   Aortic Valve Calcium score: 1576   Aortic annular dimension:   Phase assessed: 320 msec   Annular area: 385 mm2   Annular perimeter: 71.0 mm   Max diameter: 25.3 mm   Min diameter: 20.0 mm   Annular and subannular calcification: None.   Membranous septum length: 6.6 mm   Optimal coplanar projection: LAO 30 CRA 5   Coronary Artery Height above Annulus:   Left Main: 14.4 mm   Right Coronary: 19.9 mm   Sinus of Valsalva Measurements:   Non-coronary: 28 mm   Right-coronary: 27 mm   Left-coronary: 27 mm   Sinus of Valsalva Height:   Non-coronary: 19.3 mm   Right-coronary: 19.9 mm   Left-coronary: 21.4 mm   Sinotubular Junction: 26 mm   Ascending Thoracic Aorta: 31 mm   Coronary Arteries: Normal coronary origin. Right dominance. The study was performed without use of NTG and is insufficient for plaque evaluation. Please refer to recent cardiac catheterization for coronary  assessment. 3-vessel coronary calcifications noted.   Cardiac Morphology:   Right Atrium: Right atrial size is dilated. Contrast reflux into the IVC consistent  with elevated RA pressure.   Right Ventricle: The right ventricular cavity is within normal limits.   Left Atrium: Left atrial size is dilated. Mixing artifact in the LAA, cannot exclude thrombus.   Left Ventricle: The ventricular cavity size is within normal limits.   Pulmonary arteries: Dilated pulmonary artery suggestive of pulmonary hypertension.   Pulmonary veins: Normal pulmonary venous drainage.   Pericardium: Normal thickness with no significant effusion or calcium present.   Mitral Valve: The mitral valve is normal structure without significant calcification.   Extra-cardiac findings: See attached radiology report for non-cardiac structures.   IMPRESSION: 1. Annular measurements support a 23 mm S3 or 26 mm Evolut Pro.   2. No significant annular or subannular calcifications.   3. Sufficient coronary to annulus distance.   4. Optimal Fluoroscopic Angle for Delivery:  LAO 30 CRA 5   5. Dilated pulmonary artery suggestive of pulmonary hypertension.   6. Mixing artifact in the LAA, cannot exclude thrombus.   Gerri Spore T. Flora Lipps, MD   Electronically Signed: By: Lennie Odor M.D. On: 08/31/2023 20:06        Narrative & Impression  CLINICAL DATA:  89 year old female with history of severe aortic stenosis. Preprocedural study prior to potential transcatheter aortic valve replacement (TAVR) procedure.   EXAM: CT ANGIOGRAPHY CHEST, ABDOMEN AND PELVIS   TECHNIQUE: Multidetector CT imaging through the chest, abdomen and pelvis was performed using the standard protocol during bolus administration of intravenous contrast. Multiplanar reconstructed images and MIPs were obtained and reviewed to evaluate the vascular anatomy.   RADIATION DOSE REDUCTION: This exam was performed according to  the departmental dose-optimization program which includes automated exposure control, adjustment of the mA and/or kV according to patient size and/or use of iterative reconstruction technique.   CONTRAST:  95mL OMNIPAQUE IOHEXOL 350 MG/ML SOLN   COMPARISON:  None Available.   FINDINGS: CTA CHEST FINDINGS   Cardiovascular: Heart size is normal. There is no significant pericardial fluid, thickening or pericardial calcification. There is aortic atherosclerosis, as well as atherosclerosis of the great vessels of the mediastinum and the coronary arteries, including calcified atherosclerotic plaque in the left anterior descending, left circumflex and right coronary arteries. Thickening and calcification of the aortic valve. Mild calcifications of the mitral annulus.   Mediastinum/Lymph Nodes: No pathologically enlarged mediastinal or hilar lymph nodes. Esophagus is unremarkable in appearance. No axillary lymphadenopathy.   Lungs/Pleura: No suspicious appearing pulmonary nodules or masses are noted. No acute consolidative airspace disease. No pleural effusions.   Musculoskeletal/Soft Tissues: There are no aggressive appearing lytic or blastic lesions noted in the visualized portions of the skeleton. Status post bilateral shoulder arthroplasty.   CTA ABDOMEN AND PELVIS FINDINGS   Hepatobiliary: No suspicious cystic or solid hepatic lesions. No intra or extrahepatic biliary ductal dilatation. Status post cholecystectomy.   Pancreas: In the inferior aspect of the pancreatic head (axial image 108 of series 4) there is a 1.1 x 1.0 cm intermediate attenuation (52 HU) lesion. Several other low-attenuation lesions are noted throughout the body and tail of the pancreas, largest of which is in the tail measuring 1.7 x 1.3 cm (axial image 96 of series 4). Dilated pancreatic ducts in the distal body and tail of the pancreas are also noted measuring up to 5 mm in diameter. No  peripancreatic inflammatory changes.   Spleen: Unremarkable.   Adrenals/Urinary Tract: Bilateral kidneys and adrenal glands are normal in appearance. No hydroureteronephrosis. Urinary bladder is normal in appearance.   Stomach/Bowel: The appearance of  the stomach is normal. No pathologic dilatation of small bowel or colon. The appendix is not confidently identified and may be surgically absent. Regardless, there are no inflammatory changes noted adjacent to the cecum to suggest the presence of an acute appendicitis at this time.   Vascular/Lymphatic: Atherosclerotic calcifications throughout the abdominal aorta and pelvic vasculature, without definite aneurysm or dissection in the abdominal or pelvic vasculature. Vascular findings and measurements pertinent to potential TAVR procedure, as detailed below. No lymphadenopathy noted in the abdomen or pelvis.   Reproductive: Uterus and ovaries are atrophic and otherwise unremarkable in appearance.   Other: Small umbilical hernia containing omental fat. No significant volume of ascites. No pneumoperitoneum.   Musculoskeletal: There are no aggressive appearing lytic or blastic lesions noted in the visualized portions of the skeleton.   VASCULAR MEASUREMENTS PERTINENT TO TAVR:   AORTA:   Minimal Aortic Diameter-17 x 16 mm   Severity of Aortic Calcification-moderate to severe   RIGHT PELVIS:   Right Common Iliac Artery -   Minimal Diameter-11.4 x 11.5 mm   Tortuosity-mild   Calcification-mild   Right External Iliac Artery -   Minimal Diameter-8.0 x 8.6 mm   Tortuosity-severe   Calcification-mild   Right Common Femoral Artery -   Minimal Diameter-7.7 x 7.4 mm   Tortuosity-mild   Calcification-none   LEFT PELVIS:   Left Common Iliac Artery -   Minimal Diameter-10.8 x 11.6 mm   Tortuosity-mild   Calcification-mild-to-moderate   Left External Iliac Artery -   Minimal Diameter-8.3 x 8.5 mm    Tortuosity-severe   Calcification-mild   Left Common Femoral Artery -   Minimal Diameter-7.1 x 7.3 mm   Tortuosity-mild   Calcification-none   Review of the MIP images confirms the above findings.   IMPRESSION: 1. Vascular findings and measurements pertinent to potential TAVR procedure, as detailed above. 2. Thickening and calcification of the aortic valve, compatible with the reported clinical history of severe aortic stenosis. 3. Multiple cystic appearing lesions of varying degrees of complexity in the pancreas, along with dilatation of the pancreatic duct and side branches in the distal body and tail of the pancreas. Further evaluation with nonemergent abdominal MRI with and without IV gadolinium with MRCP is strongly recommended in the near future to exclude underlying neoplasm. 4. Additional incidental findings, as above.     Electronically Signed   By: Trudie Reed M.D.   On: 09/02/2023 09:59        Impression:   This 89 year old woman has stage D, severe, symptomatic aortic stenosis with NYHA class II symptoms of exertional fatigue and shortness of breath consistent with chronic diastolic congestive heart failure.  I have personally reviewed her 2D echocardiogram, cardiac catheterization, and CTA studies.  Her most recent echocardiogram in May 2024 showed an aortic valve mean gradient of 32 mmHg with a peak of 53 mmHg and a valve area of 0.72 cm by VTI.  Dimensionless index was 0.23 with a low stroke-volume index of 34.  Left ventricular ejection fraction was 60 to 65% with mild LVH and grade 2 diastolic dysfunction.  Cardiac catheterization showed no significant coronary disease.  It was not possible across the calcified and restricted aortic valve.  I agree that aortic valve replacement is indicated in this patient for relief of her symptoms and to prevent progressive left ventricular dysfunction.  Given her advanced age I do not think she would be a candidate for  open surgical aortic valve replacement.  I think transcatheter aortic  valve replacement would be a reasonable alternative for treating her.  Her gated cardiac CTA shows anatomy suitable for TAVR using a 23 mm SAPIEN 3 valve.  Her abdominal and pelvic CTA shows adequate pelvic vascular anatomy to allow transfemoral insertion.   The patient and her son were counseled at length regarding treatment alternatives for management of severe symptomatic aortic stenosis. The risks and benefits of surgical intervention has been discussed in detail. Long-term prognosis with medical therapy was discussed. Alternative approaches such as conventional surgical aortic valve replacement, transcatheter aortic valve replacement, and palliative medical therapy were compared and contrasted at length. This discussion was placed in the context of the patient's own specific clinical presentation and past medical history. All of their questions have been addressed.    Following the decision to proceed with transcatheter aortic valve replacement, a discussion was held regarding what types of management strategies would be attempted intraoperatively in the event of life-threatening complications. The patient has been advised of a variety of complications that might develop including but not limited to risks of death, stroke, paravalvular leak, aortic dissection or other major vascular complications, aortic annulus rupture, device embolization, cardiac rupture or perforation, mitral regurgitation, acute myocardial infarction, arrhythmia, heart block or bradycardia requiring permanent pacemaker placement, congestive heart failure, respiratory failure, renal failure, pneumonia, infection, other late complications related to structural valve deterioration or migration, or other complications that might ultimately cause a temporary or permanent loss of functional independence or other long term morbidity. The patient provides full informed  consent for the procedure as described and all questions were answered.       Plan:   Transfemoral TAVR using a SAPIEN 3 valve.      Alleen Borne, MD

## 2023-10-05 ENCOUNTER — Encounter (HOSPITAL_COMMUNITY): Payer: Self-pay | Admitting: Cardiovascular Disease

## 2023-10-05 ENCOUNTER — Inpatient Hospital Stay (HOSPITAL_COMMUNITY): Payer: Medicare HMO

## 2023-10-05 ENCOUNTER — Ambulatory Visit: Payer: Medicare HMO | Admitting: Podiatry

## 2023-10-05 ENCOUNTER — Encounter (HOSPITAL_COMMUNITY)
Admission: RE | Disposition: A | Discharge status: Home / Self Care | Payer: Self-pay | Source: Home / Self Care | Attending: Cardiovascular Disease

## 2023-10-05 ENCOUNTER — Other Ambulatory Visit: Payer: Self-pay

## 2023-10-05 ENCOUNTER — Other Ambulatory Visit: Payer: Self-pay | Admitting: Physician Assistant

## 2023-10-05 ENCOUNTER — Inpatient Hospital Stay (HOSPITAL_COMMUNITY)
Admission: RE | Admit: 2023-10-05 | Discharge: 2023-10-06 | DRG: 267 | Disposition: A | Discharge status: Home / Self Care | Payer: Medicare HMO | Attending: Cardiovascular Disease | Admitting: Cardiovascular Disease

## 2023-10-05 ENCOUNTER — Inpatient Hospital Stay (HOSPITAL_COMMUNITY): Payer: Medicare HMO | Admitting: Anesthesiology

## 2023-10-05 ENCOUNTER — Inpatient Hospital Stay (HOSPITAL_COMMUNITY): Payer: Self-pay | Admitting: Physician Assistant

## 2023-10-05 DIAGNOSIS — Z96653 Presence of artificial knee joint, bilateral: Secondary | ICD-10-CM | POA: Diagnosis present

## 2023-10-05 DIAGNOSIS — Z952 Presence of prosthetic heart valve: Principal | ICD-10-CM

## 2023-10-05 DIAGNOSIS — K8689 Other specified diseases of pancreas: Secondary | ICD-10-CM | POA: Diagnosis present

## 2023-10-05 DIAGNOSIS — Z8249 Family history of ischemic heart disease and other diseases of the circulatory system: Secondary | ICD-10-CM | POA: Diagnosis not present

## 2023-10-05 DIAGNOSIS — Z83438 Family history of other disorder of lipoprotein metabolism and other lipidemia: Secondary | ICD-10-CM

## 2023-10-05 DIAGNOSIS — E1140 Type 2 diabetes mellitus with diabetic neuropathy, unspecified: Secondary | ICD-10-CM | POA: Diagnosis present

## 2023-10-05 DIAGNOSIS — I130 Hypertensive heart and chronic kidney disease with heart failure and stage 1 through stage 4 chronic kidney disease, or unspecified chronic kidney disease: Secondary | ICD-10-CM | POA: Diagnosis present

## 2023-10-05 DIAGNOSIS — M1990 Unspecified osteoarthritis, unspecified site: Secondary | ICD-10-CM | POA: Diagnosis present

## 2023-10-05 DIAGNOSIS — Z833 Family history of diabetes mellitus: Secondary | ICD-10-CM

## 2023-10-05 DIAGNOSIS — I440 Atrioventricular block, first degree: Secondary | ICD-10-CM | POA: Diagnosis present

## 2023-10-05 DIAGNOSIS — Z9049 Acquired absence of other specified parts of digestive tract: Secondary | ICD-10-CM | POA: Diagnosis not present

## 2023-10-05 DIAGNOSIS — I080 Rheumatic disorders of both mitral and aortic valves: Secondary | ICD-10-CM | POA: Diagnosis present

## 2023-10-05 DIAGNOSIS — I5030 Unspecified diastolic (congestive) heart failure: Secondary | ICD-10-CM

## 2023-10-05 DIAGNOSIS — E119 Type 2 diabetes mellitus without complications: Secondary | ICD-10-CM

## 2023-10-05 DIAGNOSIS — Z006 Encounter for examination for normal comparison and control in clinical research program: Secondary | ICD-10-CM

## 2023-10-05 DIAGNOSIS — I2510 Atherosclerotic heart disease of native coronary artery without angina pectoris: Secondary | ICD-10-CM | POA: Diagnosis present

## 2023-10-05 DIAGNOSIS — I5032 Chronic diastolic (congestive) heart failure: Secondary | ICD-10-CM | POA: Diagnosis present

## 2023-10-05 DIAGNOSIS — Z6833 Body mass index (BMI) 33.0-33.9, adult: Secondary | ICD-10-CM

## 2023-10-05 DIAGNOSIS — E213 Hyperparathyroidism, unspecified: Secondary | ICD-10-CM | POA: Diagnosis present

## 2023-10-05 DIAGNOSIS — Z7982 Long term (current) use of aspirin: Secondary | ICD-10-CM

## 2023-10-05 DIAGNOSIS — Z794 Long term (current) use of insulin: Secondary | ICD-10-CM

## 2023-10-05 DIAGNOSIS — E1122 Type 2 diabetes mellitus with diabetic chronic kidney disease: Secondary | ICD-10-CM | POA: Diagnosis present

## 2023-10-05 DIAGNOSIS — G629 Polyneuropathy, unspecified: Secondary | ICD-10-CM

## 2023-10-05 DIAGNOSIS — I350 Nonrheumatic aortic (valve) stenosis: Secondary | ICD-10-CM

## 2023-10-05 DIAGNOSIS — E049 Nontoxic goiter, unspecified: Secondary | ICD-10-CM | POA: Diagnosis present

## 2023-10-05 DIAGNOSIS — N1832 Chronic kidney disease, stage 3b: Secondary | ICD-10-CM | POA: Diagnosis present

## 2023-10-05 DIAGNOSIS — M545 Low back pain, unspecified: Secondary | ICD-10-CM | POA: Diagnosis present

## 2023-10-05 DIAGNOSIS — G8929 Other chronic pain: Secondary | ICD-10-CM | POA: Diagnosis present

## 2023-10-05 DIAGNOSIS — K862 Cyst of pancreas: Secondary | ICD-10-CM | POA: Diagnosis not present

## 2023-10-05 DIAGNOSIS — Z79899 Other long term (current) drug therapy: Secondary | ICD-10-CM

## 2023-10-05 DIAGNOSIS — E785 Hyperlipidemia, unspecified: Secondary | ICD-10-CM | POA: Diagnosis present

## 2023-10-05 DIAGNOSIS — R011 Cardiac murmur, unspecified: Secondary | ICD-10-CM | POA: Diagnosis present

## 2023-10-05 DIAGNOSIS — Z79890 Hormone replacement therapy (postmenopausal): Secondary | ICD-10-CM | POA: Diagnosis not present

## 2023-10-05 DIAGNOSIS — G5600 Carpal tunnel syndrome, unspecified upper limb: Secondary | ICD-10-CM

## 2023-10-05 HISTORY — DX: Chronic kidney disease, stage 3b: N18.32

## 2023-10-05 HISTORY — DX: Presence of prosthetic heart valve: Z95.2

## 2023-10-05 HISTORY — DX: Unspecified diastolic (congestive) heart failure: I50.30

## 2023-10-05 HISTORY — DX: Nonrheumatic aortic (valve) stenosis: I35.0

## 2023-10-05 LAB — ECHOCARDIOGRAM LIMITED
AR max vel: 3.4 cm2
AV Area VTI: 3.33 cm2
AV Area mean vel: 3.38 cm2
AV Mean grad: 6 mm[Hg]
AV Peak grad: 12.3 mm[Hg]
Ao pk vel: 1.75 m/s
S' Lateral: 3.2 cm

## 2023-10-05 LAB — POCT I-STAT, CHEM 8
BUN: 31 mg/dL — ABNORMAL HIGH (ref 8–23)
Calcium, Ion: 1.36 mmol/L (ref 1.15–1.40)
Chloride: 106 mmol/L (ref 98–111)
Creatinine, Ser: 1.4 mg/dL — ABNORMAL HIGH (ref 0.44–1.00)
Glucose, Bld: 166 mg/dL — ABNORMAL HIGH (ref 70–99)
HCT: 33 % — ABNORMAL LOW (ref 36.0–46.0)
Hemoglobin: 11.2 g/dL — ABNORMAL LOW (ref 12.0–15.0)
Potassium: 3.9 mmol/L (ref 3.5–5.1)
Sodium: 142 mmol/L (ref 135–145)
TCO2: 25 mmol/L (ref 22–32)

## 2023-10-05 LAB — GLUCOSE, CAPILLARY
Glucose-Capillary: 143 mg/dL — ABNORMAL HIGH (ref 70–99)
Glucose-Capillary: 142 mg/dL — ABNORMAL HIGH (ref 70–99)
Glucose-Capillary: 115 mg/dL — ABNORMAL HIGH (ref 70–99)
Glucose-Capillary: 205 mg/dL — ABNORMAL HIGH (ref 70–99)

## 2023-10-05 LAB — POCT ACTIVATED CLOTTING TIME
Activated Clotting Time: 262 s
Activated Clotting Time: 129 s

## 2023-10-05 SURGERY — TRANSCATHETER AORTIC VALVE REPLACEMENT, TRANSFEMORAL (CATHLAB)
Anesthesia: Monitor Anesthesia Care

## 2023-10-05 MED ORDER — PROPOFOL 10 MG/ML IV BOLUS
INTRAVENOUS | Status: DC | PRN
  Administered 2023-10-05: 10 mg via INTRAVENOUS

## 2023-10-05 MED ORDER — ONDANSETRON HCL 4 MG/2ML IJ SOLN: 4.0000 mg | Freq: Four times a day (QID) | INTRAMUSCULAR | Status: DC | PRN

## 2023-10-05 MED ORDER — FENTANYL CITRATE (PF) 100 MCG/2ML IJ SOLN
INTRAMUSCULAR | Status: DC | PRN
  Administered 2023-10-05 (×2): 25 ug via INTRAVENOUS

## 2023-10-05 MED ORDER — SODIUM CHLORIDE 0.9 % IV SOLN: INTRAVENOUS | Status: AC

## 2023-10-05 MED ORDER — PROTAMINE SULFATE 10 MG/ML IV SOLN
INTRAVENOUS | Status: DC | PRN
  Administered 2023-10-05: 10 mg via INTRAVENOUS
  Administered 2023-10-05: 30 mg via INTRAVENOUS

## 2023-10-05 MED ORDER — TRAMADOL HCL 50 MG PO TABS: 50.0000 mg | ORAL_TABLET | Freq: Two times a day (BID) | ORAL | Status: DC | PRN

## 2023-10-05 MED ORDER — LACTATED RINGERS IV SOLN: INTRAVENOUS | Status: DC | PRN

## 2023-10-05 MED ORDER — ACETAMINOPHEN 650 MG RE SUPP: 650.0000 mg | Freq: Four times a day (QID) | RECTAL | Status: DC | PRN

## 2023-10-05 MED ORDER — CEFAZOLIN SODIUM-DEXTROSE 2-4 GM/100ML-% IV SOLN
2.0000 g | Freq: Three times a day (TID) | INTRAVENOUS | Status: AC
  Administered 2023-10-05 – 2023-10-06 (×2): 2 g via INTRAVENOUS
  Filled 2023-10-05 (×2): qty 100

## 2023-10-05 MED ORDER — CHLORHEXIDINE GLUCONATE 4 % EX SOLN: 60.0000 mL | Freq: Once | CUTANEOUS | Status: DC

## 2023-10-05 MED ORDER — IOHEXOL 350 MG/ML SOLN
INTRAVENOUS | Status: DC | PRN
  Administered 2023-10-05: 60 mL

## 2023-10-05 MED ORDER — PHENYLEPHRINE HCL-NACL 20-0.9 MG/250ML-% IV SOLN
INTRAVENOUS | Status: DC | PRN
  Administered 2023-10-05: 50 ug/min via INTRAVENOUS

## 2023-10-05 MED ORDER — SODIUM CHLORIDE 0.9 % IV SOLN: 250.0000 mL | INTRAVENOUS | Status: DC | PRN

## 2023-10-05 MED ORDER — SODIUM CHLORIDE 0.9% FLUSH: 3.0000 mL | Freq: Two times a day (BID) | INTRAVENOUS | Status: DC

## 2023-10-05 MED ORDER — ASPIRIN 81 MG PO TBEC
81.0000 mg | DELAYED_RELEASE_TABLET | Freq: Every day | ORAL | Status: DC
  Administered 2023-10-05: 81 mg via ORAL
  Filled 2023-10-05: qty 1

## 2023-10-05 MED ORDER — CINACALCET HCL 30 MG PO TABS
30.0000 mg | ORAL_TABLET | ORAL | Status: DC
  Administered 2023-10-06: 30 mg via ORAL
  Filled 2023-10-05: qty 1

## 2023-10-05 MED ORDER — EPHEDRINE SULFATE-NACL 50-0.9 MG/10ML-% IV SOSY
PREFILLED_SYRINGE | INTRAVENOUS | Status: DC | PRN
  Administered 2023-10-05: 5 mg via INTRAVENOUS
  Administered 2023-10-05: 10 mg via INTRAVENOUS

## 2023-10-05 MED ORDER — FENTANYL CITRATE (PF) 100 MCG/2ML IJ SOLN
INTRAMUSCULAR | Status: AC
  Filled 2023-10-05: qty 2

## 2023-10-05 MED ORDER — HEPARIN SODIUM (PORCINE) 1000 UNIT/ML IJ SOLN
INTRAMUSCULAR | Status: AC
  Filled 2023-10-05: qty 10

## 2023-10-05 MED ORDER — OXYCODONE HCL 5 MG PO TABS: 5.0000 mg | ORAL_TABLET | ORAL | Status: DC | PRN

## 2023-10-05 MED ORDER — PROPOFOL 500 MG/50ML IV EMUL
INTRAVENOUS | Status: DC | PRN
  Administered 2023-10-05: 80 ug/kg/min via INTRAVENOUS

## 2023-10-05 MED ORDER — INSULIN ASPART 100 UNIT/ML IJ SOLN: 0.0000 [IU] | INTRAMUSCULAR | Status: DC | PRN

## 2023-10-05 MED ORDER — GABAPENTIN 100 MG PO CAPS
100.0000 mg | ORAL_CAPSULE | Freq: Every day | ORAL | Status: DC
  Administered 2023-10-05: 100 mg via ORAL
  Filled 2023-10-05: qty 1

## 2023-10-05 MED ORDER — AMLODIPINE BESYLATE 5 MG PO TABS
5.0000 mg | ORAL_TABLET | Freq: Every day | ORAL | Status: DC
  Filled 2023-10-05 (×2): qty 1

## 2023-10-05 MED ORDER — SODIUM CHLORIDE 0.9% FLUSH: 3.0000 mL | INTRAVENOUS | Status: DC | PRN

## 2023-10-05 MED ORDER — LIDOCAINE HCL (PF) 1 % IJ SOLN
INTRAMUSCULAR | Status: DC | PRN
  Administered 2023-10-05 (×2): 10 mL

## 2023-10-05 MED ORDER — NITROGLYCERIN IN D5W 200-5 MCG/ML-% IV SOLN: 0.0000 ug/min | INTRAVENOUS | Status: DC

## 2023-10-05 MED ORDER — HEPARIN (PORCINE) IN NACL 1000-0.9 UT/500ML-% IV SOLN
INTRAVENOUS | Status: DC | PRN
  Administered 2023-10-05: 500 mL

## 2023-10-05 MED ORDER — CHLORHEXIDINE GLUCONATE 0.12 % MT SOLN
15.0000 mL | Freq: Once | OROMUCOSAL | Status: AC
  Administered 2023-10-05: 15 mL via OROMUCOSAL
  Filled 2023-10-05: qty 15

## 2023-10-05 MED ORDER — ONDANSETRON HCL 4 MG/2ML IJ SOLN
INTRAMUSCULAR | Status: DC | PRN
  Administered 2023-10-05: 4 mg via INTRAVENOUS

## 2023-10-05 MED ORDER — CHLORHEXIDINE GLUCONATE 4 % EX SOLN
30.0000 mL | CUTANEOUS | Status: DC
Start: 2023-10-05 — End: 2023-10-05

## 2023-10-05 MED ORDER — CHLORHEXIDINE GLUCONATE 4 % EX SOLN
60.0000 mL | Freq: Once | CUTANEOUS | Status: DC
Start: 2023-10-05 — End: 2023-10-05

## 2023-10-05 MED ORDER — HEPARIN (PORCINE) IN NACL 2000-0.9 UNIT/L-% IV SOLN
INTRAVENOUS | Status: DC | PRN
  Administered 2023-10-05: 1000 mL

## 2023-10-05 MED ORDER — ROSUVASTATIN CALCIUM 20 MG PO TABS
40.0000 mg | ORAL_TABLET | Freq: Every day | ORAL | Status: DC
  Administered 2023-10-05: 40 mg via ORAL
  Filled 2023-10-05 (×2): qty 2

## 2023-10-05 MED ORDER — SODIUM CHLORIDE 0.9 % IV SOLN: INTRAVENOUS | Status: DC

## 2023-10-05 MED ORDER — MORPHINE SULFATE (PF) 2 MG/ML IV SOLN: 1.0000 mg | INTRAVENOUS | Status: DC | PRN

## 2023-10-05 MED ORDER — ACETAMINOPHEN 325 MG PO TABS
650.0000 mg | ORAL_TABLET | Freq: Four times a day (QID) | ORAL | Status: DC | PRN
  Administered 2023-10-05: 650 mg via ORAL
  Filled 2023-10-05: qty 2

## 2023-10-05 MED ORDER — SODIUM CHLORIDE 0.9 % IV SOLN: INTRAVENOUS | Status: DC | PRN

## 2023-10-05 MED ORDER — INSULIN ASPART 100 UNIT/ML IJ SOLN
0.0000 [IU] | Freq: Three times a day (TID) | INTRAMUSCULAR | Status: DC
  Administered 2023-10-05: 8 [IU] via SUBCUTANEOUS

## 2023-10-05 MED ORDER — LEVOTHYROXINE SODIUM 50 MCG PO TABS
50.0000 ug | ORAL_TABLET | Freq: Every day | ORAL | Status: DC
  Administered 2023-10-06: 50 ug via ORAL
  Filled 2023-10-05: qty 1

## 2023-10-05 MED ORDER — HEPARIN SODIUM (PORCINE) 1000 UNIT/ML IJ SOLN
INTRAMUSCULAR | Status: DC | PRN
  Administered 2023-10-05: 10000 [IU] via INTRAVENOUS

## 2023-10-05 MED ORDER — LIDOCAINE HCL (PF) 1 % IJ SOLN
INTRAMUSCULAR | Status: AC
  Filled 2023-10-05: qty 30

## 2023-10-05 SURGICAL SUPPLY — 28 items
BAG SNAP BAND KOVER 36X36 (MISCELLANEOUS) ×2
CABLE ADAPT PACING TEMP 12FT (ADAPTER) ×1
CATH 23 ULTRA DELIVERY (CATHETERS) ×1
CATH DIAG 6FR PIGTAIL ANGLED (CATHETERS) ×1
CATH INFINITI 5FR ANG PIGTAIL (CATHETERS) ×1
CATH INFINITI 6F AL1 (CATHETERS) ×1
CATH S G BIP PACING (CATHETERS) ×1
CLOSURE MYNX CONTROL 6F/7F (Vascular Products) ×1 IMPLANT
CLOSURE PERCLOSE PROSTYLE (VASCULAR PRODUCTS) ×2
CRIMPER (MISCELLANEOUS) ×1
DEVICE INFLATION ATRION QL2530 (MISCELLANEOUS) ×1
ELECT DEFIB PAD ADLT CADENCE (PAD) ×1
KIT MICROPUNCTURE NIT STIFF (SHEATH) ×1
KIT SAPIAN 3 ULTRA RESILIA 23 (Valve) ×1 IMPLANT
PACK CARDIAC CATHETERIZATION (CUSTOM PROCEDURE TRAY) ×1
SET ATX-X65L (MISCELLANEOUS) ×1
SHEATH BRITE TIP 7FR 35CM (SHEATH) ×1
SHEATH INTRODUCER SET 20-26 (SHEATH) ×1
SHEATH PINNACLE 6F 10CM (SHEATH) ×1
SHEATH PINNACLE 8F 10CM (SHEATH) ×1
SHEATH PROBE COVER 6X72 (BAG) ×1
STOPCOCK MORSE 400PSI 3WAY (MISCELLANEOUS) ×2
TUBING ART PRESS 72 MALE/FEM (TUBING) ×1
WIRE AMPLATZ SS-J .035X260CM (WIRE) ×1
WIRE EMERALD 3MM-J .035X150CM (WIRE) ×1
WIRE EMERALD 3MM-J .035X260CM (WIRE) ×1
WIRE EMERALD ST .035X260CM (WIRE) ×1
WIRE SAFARI SM CURVE 275 (WIRE) ×1

## 2023-10-05 NOTE — Anesthesia Preprocedure Evaluation (Signed)
Anesthesia Evaluation  Patient identified by MRN, date of birth, ID band Patient awake    Reviewed: Allergy & Precautions, NPO status , Patient's Chart, lab work & pertinent test results  Airway Mallampati: II  TM Distance: >3 FB Neck ROM: Full    Dental no notable dental hx.    Pulmonary neg pulmonary ROS   Pulmonary exam normal        Cardiovascular hypertension, Pt. on medications and Pt. on home beta blockers +CHF  + Valvular Problems/Murmurs AS  Rhythm:Regular Rate:Normal + Systolic murmurs    Neuro/Psych  Headaches  negative psych ROS   GI/Hepatic negative GI ROS, Neg liver ROS,,,  Endo/Other  diabetes, Type 2, Insulin Dependent    Renal/GU   negative genitourinary   Musculoskeletal  (+) Arthritis , Osteoarthritis,    Abdominal Normal abdominal exam  (+)   Peds  Hematology  (+) Blood dyscrasia, anemia Lab Results      Component                Value               Date                      WBC                      6.6                 10/01/2023                HGB                      12.5                10/01/2023                HCT                      37.8                10/01/2023                MCV                      95.5                10/01/2023                PLT                      156                 10/01/2023             Lab Results      Component                Value               Date                      NA                       139                 10/01/2023  K                        4.2                 10/01/2023                CO2                      25                  10/01/2023                GLUCOSE                  218 (H)             10/01/2023                BUN                      40 (H)              10/01/2023                CREATININE               1.64 (H)            10/01/2023                CALCIUM                  10.3                10/01/2023                 GFR                      37.81 (L)           11/04/2017                EGFR                     31 (L)              08/09/2023                GFRNONAA                 30 (L)              10/01/2023              Anesthesia Other Findings   Reproductive/Obstetrics                             Anesthesia Physical Anesthesia Plan  ASA: 4  Anesthesia Plan: MAC   Post-op Pain Management:    Induction: Intravenous  PONV Risk Score and Plan: 2 and Ondansetron and Treatment may vary due to age or medical condition  Airway Management Planned: Simple Face Mask and Nasal Cannula  Additional Equipment: Arterial line  Intra-op Plan:   Post-operative Plan:   Informed Consent: I have reviewed the patients History and Physical, chart, labs and discussed the procedure including the risks, benefits and alternatives for the proposed anesthesia with the patient or authorized representative who has indicated his/her understanding and acceptance.     Dental advisory  given  Plan Discussed with: CRNA  Anesthesia Plan Comments:        Anesthesia Quick Evaluation

## 2023-10-05 NOTE — Discharge Summary (Incomplete)
[facility]   MULTIDISCIPLINARY HEART VALVE TEAM  Discharge Summary    Patient ID: Bonnie Chavez MRN: [id number]; DOB: 1934/05/09  Admit date: 10/05/2023 Discharge date: 10/06/2023  Primary Care Provider: Deeann Saint, MD  Primary Cardiologist: Nicki Guadalajara, MD / Dr. Excell Seltzer & Dr. Laneta Simmers (TAVR)  Discharge Diagnoses    Principal Problem:   S/P TAVR (transcatheter aortic valve replacement) Active Problems:   Diabetes mellitus, type II (HCC)   Hyperlipidemia   Morbid obesity (HCC)   Severe aortic stenosis   Carpal tunnel syndrome   Neuropathy   (HFpEF) heart failure with preserved ejection fraction (HCC)   CKD stage 3b, GFR 30-44 ml/min (HCC)   1st degree AV block   Allergies Allergies  Allergen Reactions   Lisinopril Hives    Cough, hives, and lip edema    Nsaids Other (See Comments)    Abnormal renal function   Penicillins Rash and Other (See Comments)    Pt has taken Keflex without difficulty Has patient had a PCN reaction causing immediate rash, facial/tongue/throat swelling, SOB or lightheadedness with hypotension: yes Has patient had a PCN reaction causing severe rash involving mucus membranes or skin necrosis: no Has patient had a PCN reaction that required hospitalization no Has patient had a PCN reaction occurring within the last 10 years: no If all of the above answers are "NO", then may proceed with Cephalosporin use.    Diagnostic Studies/Procedures    TAVR OPERATIVE NOTE     Date of Procedure:                10/05/2023   Preoperative Diagnosis:      Severe Aortic Stenosis    Postoperative Diagnosis:    Same    Procedure:        Transcatheter Aortic Valve Replacement - Percutaneous Right Transfemoral Approach             Edwards Sapien 3 Ultra ResiliaTHV (size 23 mm, model # 9755RSL, serial # [id number])              Co-Surgeons:                        Alleen Borne, MD and Tonny Bollman, MD    Anesthesiologist:                   Lavenia Atlas, DO   Echocardiographer:              Burna Forts, MD   Pre-operative Echo Findings: Severe aortic stenosis Normal left ventricular systolic function   Post-operative Echo Findings: No paravalvular leak Normal left ventricular systolic function  _____________    Echo 10/06/23: completed but pending formal read at the time of discharge   History of Present Illness     Bonnie Chavez is a 89 y.o. female with a history of HTN, HLD, CKD stage IIIb, 1st degree AV block, carpal tunnel, T2DM, OA and severe aortic stenosis who presented to [facility] on 10/05/23 for planned TAVR.   She reported progressive exertional fatigue and shortness of breath over the past year. Echo 03/24/23 showed EF 60% and severe AS with a mean grad 32 mmHg, AVA 0.72 cm2, DVI 0.23. LHC 08/16/23 showed patent coronary arteries, right dominant, with moderate coronary calcification and mild plaquing but no significant obstructive disease. Unable to cross valve at the time of cath. Pre TAVR CTs showed a possible LAA thrombus that was reviewed and felt to be  mixing artifact.   The patient was evaluated by the multidisciplinary valve team and felt to have severe, symptomatic aortic stenosis and to be a suitable candidate for TAVR, which was set up for 10/05/23.   Hospital Course     Consultants: none   Severe AS: s/p successful TAVR with a 23 mm Edwards Sapien 3 Ultra Resilia THV via the TF approach on 10/05/23. Post operative echo completed but pending formal read. Groin sites are stable. Continued on home Asprin 81mg  daily. Walked with cardiac rehab with no issues. Plan for discharge home today with close follow up in the outpatient setting.   1st degree AV block: pt has a long standing 1st degree AV block. There was a questionable ~5.4 second pause on telemetry which may have been related to loss of lead contact. There were some short runs possible atrial tach and 2:1 AV bock on telemetry, but all ECGs show sinus  bradycardia with her baseline 1st degree AV block (with no significant progression). She has been asymptomatic. Will plan to stop her home Bystolic 10mg  in AM/5mg  in PM and discharge home with live ambulatory telemetry to rule out HAVB.   HTN: BP well controlled. Resumed on home meds with the exception of Bystolic as above. Will increase Norvasc from 5mg  to 10mg  daily and follow BP in the outpatient setting.   CKD stage IIIb: creat baseline 1.3-1.64. Creat stable today at 1.43.  DMT2: treated with SSI while admitted. Resume home meds at discharge.   Pancreatic cysts: pre TAVR scans showed "multiple cystic appearing lesions of varying degrees of complexity in the pancreas, along with dilatation of the pancreatic duct and side branches in the distal body and tail of the pancreas. Further evaluation with nonemergent abdominal MRI with and without. IV gadolinium with MRCP is strongly recommended in the near future to exclude underlying neoplasm." This will be discussed in the outpatient setting.   Elevated Raise Score: 4 (carpal tunnel and age). Will discuss possibly amyloid work up in the outpatient setting.   _____________  Discharge Vitals Blood pressure (!) 124/57, pulse 73, temperature 98.7 F (37.1 C), temperature source Oral, resp. rate 20, height 5\' 3"  (1.6 m), weight 83.8 kg, SpO2 95%.  Filed Weights   10/05/23 0713 10/06/23 0604 10/06/23 0609  Weight: 85.3 kg 86.9 kg 83.8 kg    GEN: Well nourished, well developed, in no acute distress HEENT: normal Neck: no JVD or masses Cardiac: RRR; soft flow murmur noted at RUSB. No rubs, or gallops,no edema  Respiratory:  clear to auscultation bilaterally, normal work of breathing GI: soft, nontender, nondistended, + BS MS: no deformity or atrophy Skin: warm and dry, no rash.  Groin sites clear without hematoma or ecchymosis  Neuro:  Alert and Oriented x 3, Strength and sensation are intact Psych: euthymic mood, full affect   Disposition    Pt is being discharged home today in good condition.  Follow-up Plans & Appointments     Follow-up Information     Janetta Hora, PA-C. Go on 10/15/2023.   Specialties: Cardiology, Radiology Why: @ 9:50am, please arrive at least 10 minutes early. Contact information: [address] [place] [address] [phone]                  Discharge Medications   Allergies as of 10/06/2023       Reactions   Lisinopril Hives   Cough, hives, and lip edema   Nsaids Other (See Comments)  Abnormal renal function   Penicillins Rash, Other (See Comments)   Pt has taken Keflex without difficulty Has patient had a PCN reaction causing immediate rash, facial/tongue/throat swelling, SOB or lightheadedness with hypotension: yes Has patient had a PCN reaction causing severe rash involving mucus membranes or skin necrosis: no Has patient had a PCN reaction that required hospitalization no Has patient had a PCN reaction occurring within the last 10 years: no If all of the above answers are "NO", then may proceed with Cephalosporin use.        Medication List     STOP taking these medications    nebivolol 10 MG tablet Commonly known as: BYSTOLIC       TAKE these medications    acetaminophen 500 MG tablet Commonly known as: TYLENOL Take 500 mg by mouth every 6 (six) hours as needed (Arthritis).   amLODipine 10 MG tablet Commonly known as: NORVASC Take 1 tablet (10 mg total) by mouth daily. What changed:  medication strength how much to take   aspirin EC 81 MG tablet Take 81 mg by mouth at bedtime. Swallow whole.   BD Pen Needle Nano 2nd Gen 32G X 4 MM Misc Generic drug: Insulin Pen Needle USE AS DIRECTED 90   BLUE-EMU HEMP EX Apply 1 application  topically daily as needed (pain).   cinacalcet 30 MG tablet Commonly known as: SENSIPAR Take 30 mg by mouth every Monday, Wednesday, and Friday.   furosemide 20 MG tablet Commonly known  as: LASIX Take 40 mg by mouth every morning. Can take additional tablet if needed   gabapentin 100 MG capsule Commonly known as: NEURONTIN Take 100 mg by mouth at bedtime.   HumuLIN 70/30 KwikPen (70-30) 100 UNIT/ML KwikPen Generic drug: insulin isophane & regular human KwikPen Inject 4-8 Units into the skin See admin instructions. 4 units in the AM and 8 units in the pm   levothyroxine 50 MCG tablet Commonly known as: SYNTHROID Take 50 mcg by mouth daily before breakfast.   OneTouch Delica Lancets 33G Misc Apply topically.   OneTouch Verio test strip Generic drug: glucose blood 1 each 2 (two) times daily.   rosuvastatin 40 MG tablet Commonly known as: CRESTOR TAKE 1 TABLET BY MOUTH EVERYDAY AT BEDTIME           Outstanding Labs/Studies   none  Duration of Discharge Encounter   Greater than 30 minutes including physician time.  Byrd Hesselbach, PA-C 10/06/2023, 10:42 AM [phone]  Patient seen, examined. Available data reviewed. Agree with findings, assessment, and plan as outlined by Carlean Jews, PA-C.  The patient is independently interviewed and examined.  She is doing very well this morning and is without complaint.  Telemetry shows first-degree AV block unchanged from baseline.  She had a few pauses overnight but nothing greater than 3 seconds.  I think she is stable for discharge today with a live ZIO monitor.  Her exam today shows that she is alert and oriented in no distress.  JVP is normal, lungs are clear, heart is regular rate and rhythm with a 2/6 systolic murmur at the right upper sternal border, abdomen is soft nontender, extremities have no edema.  Bilateral groin sites are clear.  She understands post TAVR instructions and we reviewed these today.  All of her questions were answered.  Follow-up as above.  Tonny Bollman, M.D. 10/06/2023 9:57 PM

## 2023-10-05 NOTE — Progress Notes (Signed)
  Echocardiogram 2D Echocardiogram has been performed.  Bonnie Chavez 10/05/2023, 10:35 AM

## 2023-10-05 NOTE — Progress Notes (Signed)
Placed zoll pads per PA order.   Lawson Radar, RN

## 2023-10-05 NOTE — Transfer of Care (Signed)
Immediate Anesthesia Transfer of Care Note  Patient: Bonnie Chavez  Procedure(s) Performed: Transcatheter Aortic Valve Replacement, Transfemoral  Patient Location: PACU and Cath Lab  Anesthesia Type:MAC  Level of Consciousness: awake, drowsy, patient cooperative, and responds to stimulation  Airway & Oxygen Therapy: Patient Spontanous Breathing and Patient connected to face mask oxygen  Post-op Assessment: Report given to RN and Post -op Vital signs reviewed and stable  Post vital signs: Reviewed and stable  Last Vitals:  Vitals Value Taken Time  BP    Temp    Pulse 60 10/05/23 1053  Resp    SpO2 100 % 10/05/23 1053  Vitals shown include unfiled device data.  Last Pain:  Vitals:   10/05/23 0745  TempSrc:   PainSc: 2          Complications: No notable events documented.

## 2023-10-05 NOTE — Interval H&P Note (Signed)
History and Physical Interval Note:  10/05/2023 9:27 AM  Bonnie Chavez  has presented today for surgery, with the diagnosis of Severe Aortic Stenosis.  The various methods of treatment have been discussed with the patient and family. After consideration of risks, benefits and other options for treatment, the patient has consented to  Procedure(s): Transcatheter Aortic Valve Replacement, Transfemoral (N/A) INTRAOPERATIVE TRANSTHORACIC ECHOCARDIOGRAM (N/A) as a surgical intervention.  The patient's history has been reviewed, patient examined, no change in status, stable for surgery.  I have reviewed the patient's chart and labs.  Questions were answered to the patient's satisfaction.     Alleen Borne

## 2023-10-05 NOTE — Discharge Instructions (Signed)

## 2023-10-05 NOTE — Op Note (Signed)
[facility]   MULTIDISCIPLINARY HEART VALVE TEAM   TAVR OPERATIVE NOTE   Date of Procedure:  10/05/2023  Preoperative Diagnosis: Severe Aortic Stenosis   Postoperative Diagnosis: Same   Procedure:   Transcatheter Aortic Valve Replacement - Percutaneous Transfemoral Approach  Edwards Sapien 3 Ultra Resilia THV (size 23 mm, serial # [id number] )   Co-Surgeons:  Evelene Croon, MD and Tonny Bollman, MD  Anesthesiologist:  Erick Blinks, DO  Echocardiographer:  Charlton Haws, MD  Pre-operative Echo Findings: Severe aortic stenosis Normal left ventricular systolic function  Post-operative Echo Findings: No paravalvular leak Normal/unchanged left ventricular systolic function  BRIEF CLINICAL NOTE AND INDICATIONS FOR SURGERY  89 year old woman, functionally independent, with severe, stage D1 aortic stenosis.  NYHA functional class II symptoms of chronic diastolic heart failure reported.  The patient's echo is personally reviewed and demonstrates normal LV systolic function with LVEF 65%, grade 2 diastolic dysfunction with pseudonormalization, normal RV function, moderate MAC with no significant mitral valve dysfunction, and severe aortic stenosis.  The aortic valve is severely calcified and restricted.  Peak and mean transvalvular gradients are 53 and 32 mmHg, respectively.  The aortic valve area calculates to 0.7 cm.  Stroke-volume index is 34 and LVOT/aortic valve VTI ratio 0.23.  Echo findings are all consistent with severe aortic stenosis.  Prior echo studies are reviewed and the patient's most recent echo in October 2023 showed moderate aortic stenosis with a mean gradient of 24 mmHg.  The patient has had significant echo progression of her aortic stenosis over the past year.  This correlates with her worsening dyspnea and fatigue, now with New York Heart Association functional class II symptoms.   During the course of the patient's preoperative work up they have  been evaluated comprehensively by a multidisciplinary team of specialists coordinated through the Multidisciplinary Heart Valve Clinic in the [facility].  They have been demonstrated to suffer from symptomatic severe aortic stenosis as noted above. The patient has been counseled extensively as to the relative risks and benefits of all options for the treatment of severe aortic stenosis including long term medical therapy, conventional surgery for aortic valve replacement, and transcatheter aortic valve replacement.  The patient has been independently evaluated in formal cardiac surgical consultation by Dr Laneta Simmers, who deemed the patient appropriate for TAVR. Based upon review of all of the patient's preoperative diagnostic tests they are felt to be candidate for transcatheter aortic valve replacement using the transfemoral approach as an alternative to conventional surgery.    Following the decision to proceed with transcatheter aortic valve replacement, a discussion has been held regarding what types of management strategies would be attempted intraoperatively in the event of life-threatening complications, including whether or not the patient would be considered a candidate for the use of cardiopulmonary bypass and/or conversion to open sternotomy for attempted surgical intervention.  The patient has been advised of a variety of complications that might develop peculiar to this approach including but not limited to risks of death, stroke, paravalvular leak, aortic dissection or other major vascular complications, aortic annulus rupture, device embolization, cardiac rupture or perforation, acute myocardial infarction, arrhythmia, heart block or bradycardia requiring permanent pacemaker placement, congestive heart failure, respiratory failure, renal failure, pneumonia, infection, other late complications related to structural valve deterioration or migration, or other complications that  might ultimately cause a temporary or permanent loss of functional independence or other long term morbidity.  The patient provides full informed consent for the procedure  as described and all questions were answered preoperatively.  DETAILS OF THE OPERATIVE PROCEDURE  PREPARATION:   The patient is brought to the operating room on the above mentioned date and central monitoring was established by the anesthesia team including placement of a radial arterial line. The patient is placed in the supine position on the operating table.  Intravenous antibiotics are administered. The patient is monitored closely throughout the procedure under conscious sedation.  Baseline transthoracic echocardiogram is performed. The patient's chest, abdomen, both groins, and both lower extremities are prepared and draped in a sterile manner. A time out procedure is performed.   PERIPHERAL ACCESS:   Using ultrasound guidance, femoral arterial and venous access is obtained with placement of 6 Fr sheaths on the left side.  [place] images are digitally captured and stored in the patient's chart. A pigtail diagnostic catheter was passed through the femoral arterial sheath under fluoroscopic guidance into the aortic root.  A temporary transvenous pacemaker catheter was passed through the femoral venous sheath under fluoroscopic guidance into the right ventricle.  The pacemaker was tested to ensure stable lead placement and pacemaker capture. Aortic root angiography was performed in order to determine the optimal angiographic angle for valve deployment.  TRANSFEMORAL ACCESS:  A micropuncture technique is used to access the right femoral artery under fluoroscopic and ultrasound guidance.  2 Perclose devices are deployed at 10' and 2' positions to 'PreClose' the femoral artery. An 8 French sheath is placed and then an Amplatz Superstiff wire is advanced through the sheath. This is changed out for a 14 French transfemoral E-Sheath after  progressively dilating over the Superstiff wire.  An AL-1 catheter was used to direct a straight-tip exchange length wire across the native aortic valve into the left ventricle. This was exchanged out for a pigtail catheter and position was confirmed in the LV apex. Simultaneous LV and Ao pressures were recorded.  The pigtail catheter was exchanged for a Safari wire in the LV apex.    BALLOON AORTIC VALVULOPLASTY:  Not performed  TRANSCATHETER HEART VALVE DEPLOYMENT:  An Edwards Sapien 3 Ultra Resilia transcatheter heart valve (size 23 mm) was prepared and crimped per manufacturer's guidelines, and the proper orientation of the valve is confirmed on the [employer] delivery system. The valve was advanced through the introducer sheath using normal technique until in an appropriate position in the abdominal aorta beyond the sheath tip. The balloon was then retracted and using the fine-tuning wheel was centered on the valve. The valve was then advanced across the aortic arch using appropriate flexion of the catheter. The valve was carefully positioned across the aortic valve annulus. The Commander catheter was retracted using normal technique. Once final position of the valve has been confirmed by angiographic assessment, the valve is deployed while temporarily holding ventilation and during rapid ventricular pacing to maintain systolic blood pressure < 50 mmHg and pulse pressure < 10 mmHg. The balloon inflation is held for >3 seconds after reaching full deployment volume. Once the balloon has fully deflated the balloon is retracted into the ascending aorta and valve function is assessed using echocardiography. The patient's hemodynamic recovery following valve deployment is good.  The deployment balloon and guidewire are both removed. Echo demostrated acceptable post-procedural gradients, stable mitral valve function, and no aortic insufficiency.    PROCEDURE COMPLETION:  The sheath was removed and  femoral artery closure is performed using the 2 previously deployed Perclose devices.  Protamine is administered once femoral arterial repair was complete. The  site is clear with no evidence of bleeding or hematoma after the sutures are tightened. The temporary pacemaker and pigtail catheters are removed. Mynx closure is used for contralateral femoral arterial hemostasis for the 6 Fr sheath.  The patient tolerated the procedure well and is transported to the recovery area in stable condition. There were no immediate intraoperative complications. All sponge instrument and needle counts are verified correct at completion of the operation.   The patient received a total of 60 mL of intravenous contrast during the procedure.  EBL: minimal  LVEDP: 18 mmHg   Tonny Bollman, MD 10/05/2023 11:10 AM

## 2023-10-05 NOTE — Anesthesia Postprocedure Evaluation (Signed)
Anesthesia Post Note  Patient: Bonnie Chavez  Procedure(s) Performed: Transcatheter Aortic Valve Replacement, Transfemoral     Patient location during evaluation: PACU Anesthesia Type: MAC Level of consciousness: awake and alert Pain management: pain level controlled Vital Signs Assessment: post-procedure vital signs reviewed and stable Respiratory status: spontaneous breathing, nonlabored ventilation, respiratory function stable and patient connected to nasal cannula oxygen Cardiovascular status: stable and blood pressure returned to baseline Postop Assessment: no apparent nausea or vomiting Anesthetic complications: no   There were no known notable events for this encounter.  Last Vitals:  Vitals:   10/05/23 1239 10/05/23 1330  BP: (!) 130/58 (!) 121/54  Pulse: (!) 51   Resp: 17 14  Temp: (!) 36.3 C   SpO2: 98% 97%    Last Pain:  Vitals:   10/05/23 1335  TempSrc:   PainSc: 2                  Nelle Don Sharonne Ricketts

## 2023-10-05 NOTE — Op Note (Signed)
[facility]   MULTIDISCIPLINARY HEART VALVE TEAM   TAVR OPERATIVE NOTE   Date of Procedure:  10/05/2023  Preoperative Diagnosis: Severe Aortic Stenosis   Postoperative Diagnosis: Same   Procedure:   Transcatheter Aortic Valve Replacement - Percutaneous Right Transfemoral Approach  Edwards Sapien 3 Ultra ResiliaTHV (size 23 mm, model # 9755RSL, serial # [id number])   Co-Surgeons:  Alleen Borne, MD and Tonny Bollman, MD   Anesthesiologist:  G. Stoltzfus, DO  Echocardiographer:  P. Eden Emms, MD  Pre-operative Echo Findings: Severe aortic stenosis Normal left ventricular systolic function  Post-operative Echo Findings: No paravalvular leak Normal left ventricular systolic function   BRIEF CLINICAL NOTE AND INDICATIONS FOR SURGERY    This 89 year old woman has stage D, severe, symptomatic aortic stenosis with NYHA class II symptoms of exertional fatigue and shortness of breath consistent with chronic diastolic congestive heart failure.  I have personally reviewed her 2D echocardiogram, cardiac catheterization, and CTA studies.  Her most recent echocardiogram in May 2024 showed an aortic valve mean gradient of 32 mmHg with a peak of 53 mmHg and a valve area of 0.72 cm by VTI.  Dimensionless index was 0.23 with a low stroke-volume index of 34.  Left ventricular ejection fraction was 60 to 65% with mild LVH and grade 2 diastolic dysfunction.  Cardiac catheterization showed no significant coronary disease.  It was not possible across the calcified and restricted aortic valve.  I agree that aortic valve replacement is indicated in this patient for relief of her symptoms and to prevent progressive left ventricular dysfunction.  Given her advanced age I do not think she would be a candidate for open surgical aortic valve replacement.  I think transcatheter aortic valve replacement would be a reasonable alternative for treating her.  Her gated cardiac CTA shows anatomy  suitable for TAVR using a 23 mm SAPIEN 3 valve.  Her abdominal and pelvic CTA shows adequate pelvic vascular anatomy to allow transfemoral insertion.   The patient and her son were counseled at length regarding treatment alternatives for management of severe symptomatic aortic stenosis. The risks and benefits of surgical intervention has been discussed in detail. Long-term prognosis with medical therapy was discussed. Alternative approaches such as conventional surgical aortic valve replacement, transcatheter aortic valve replacement, and palliative medical therapy were compared and contrasted at length. This discussion was placed in the context of the patient's own specific clinical presentation and past medical history. All of their questions have been addressed.    Following the decision to proceed with transcatheter aortic valve replacement, a discussion was held regarding what types of management strategies would be attempted intraoperatively in the event of life-threatening complications. The patient has been advised of a variety of complications that might develop including but not limited to risks of death, stroke, paravalvular leak, aortic dissection or other major vascular complications, aortic annulus rupture, device embolization, cardiac rupture or perforation, mitral regurgitation, acute myocardial infarction, arrhythmia, heart block or bradycardia requiring permanent pacemaker placement, congestive heart failure, respiratory failure, renal failure, pneumonia, infection, other late complications related to structural valve deterioration or migration, or other complications that might ultimately cause a temporary or permanent loss of functional independence or other long term morbidity. The patient provides full informed consent for the procedure as described and all questions were answered.       DETAILS OF THE OPERATIVE PROCEDURE  PREPARATION:    The patient was brought to the operating  room on the above mentioned date and appropriate  monitoring was established by the anesthesia team. The patient was placed in the supine position on the operating table.  Intravenous antibiotics were administered. The patient was monitored closely throughout the procedure under conscious sedation.    Baseline transthoracic echocardiogram was performed. The patient's abdomen and both groins were prepped and draped in a sterile manner. A time out procedure was performed.   PERIPHERAL ACCESS:    Using the modified Seldinger technique, femoral arterial and venous access was obtained with placement of 6 Fr sheaths on the left side.  A pigtail diagnostic catheter was passed through the left arterial sheath under fluoroscopic guidance into the aortic root.  A temporary transvenous pacemaker catheter was passed through the left femoral venous sheath under fluoroscopic guidance into the right ventricle.  The pacemaker was tested to ensure stable lead placement and pacemaker capture. Aortic root angiography was performed in order to determine the optimal angiographic angle for valve deployment.   TRANSFEMORAL ACCESS:   Percutaneous transfemoral access and sheath placement was performed using ultrasound guidance.  The right common femoral artery was cannulated using a micropuncture needle and appropriate location was verified using hand injection angiogram.  A pair of Abbott Perclose percutaneous closure devices were placed and a 6 French sheath replaced into the femoral artery.  The patient was heparinized systemically and ACT verified > 250 seconds.    A 14 Fr transfemoral E-sheath was introduced into the right common femoral artery after progressively dilating over an Amplatz superstiff wire. An AL-1 catheter was used to direct a straight-tip exchange length wire across the native aortic valve into the left ventricle. This was exchanged out for a pigtail catheter and position was confirmed in the LV apex.  Simultaneous LV and Ao pressures were recorded.  The pigtail catheter was exchanged for a Safari wire in the LV apex.   BALLOON AORTIC VALVULOPLASTY:   Not performed   TRANSCATHETER HEART VALVE DEPLOYMENT:   An Edwards Sapien 3 Ultra transcatheter heart valve (size 23 mm) was prepared and crimped per manufacturer's guidelines, and the proper orientation of the valve is confirmed on the [employer] delivery system. The valve was advanced through the introducer sheath using normal technique until in an appropriate position in the abdominal aorta beyond the sheath tip. The balloon was then retracted and using the fine-tuning wheel was centered on the valve. The valve was then advanced across the aortic arch using appropriate flexion of the catheter. The valve was carefully positioned across the aortic valve annulus. The Commander catheter was retracted using normal technique. Once final position of the valve has been confirmed by angiographic assessment, the valve is deployed during rapid ventricular pacing to maintain systolic blood pressure < 50 mmHg and pulse pressure < 10 mmHg. The balloon inflation is held for >3 seconds after reaching full deployment volume. Once the balloon has fully deflated the balloon is retracted into the ascending aorta and valve function is assessed using echocardiography. There is felt to be no paravalvular leak and no central aortic insufficiency.  The patient's hemodynamic recovery following valve deployment is good.  The deployment balloon and guidewire are both removed.    PROCEDURE COMPLETION:   The sheath was removed and femoral artery closure performed.  Protamine was administered once femoral arterial repair was complete. The temporary pacemaker, pigtail catheter and femoral sheaths were removed with manual pressure used for venous hemostasis.  A Mynx femoral closure device was utilized following removal of the diagnostic sheath in the left femoral  artery.  The patient tolerated the procedure well and is transported to the cath lab recovery area in stable condition. There were no immediate intraoperative complications. All sponge instrument and needle counts are verified correct at completion of the operation.   No blood products were administered during the operation.  The patient received a total of 60 mL of intravenous contrast during the procedure.   Alleen Borne, MD 10/05/2023 1:36 PM

## 2023-10-05 NOTE — Progress Notes (Addendum)
  [facility]   MULTIDISCIPLINARY HEART VALVE TEAM  Called due to pauses on telemetry. Reviewed tele and there is a 5.44 ms sinus arrest. It is totally flat without p waves, ? If a lead lost contact. ECG was done after showed sinus brady with further prolongation of PR to (pre op 282 ms, although it has been as long as on a previous tracing on 06/01/23). Having other short pauses, possibly 2:1 AV block. Will continue to hold home BB and place temp pacer pads out of an abundance of caution.    Cline Crock PA-C  [facility]

## 2023-10-05 NOTE — Progress Notes (Addendum)
Received call from CCMD notified pt had frequent pause. The longest paused was 5.48s. pt was asymptomatic. EKG obtained. PA notified and aware.  Will continue to monitor the pt.   Lawson Radar, RN

## 2023-10-05 NOTE — Progress Notes (Addendum)
Pt admitted to rm 3 from cath lab. Initiated tele. Oriented pt to the unit. VSS. Call bell within reach. Bed rest started at 1040 am per report.   Lawson Radar, RN

## 2023-10-05 NOTE — Progress Notes (Signed)
  [facility]   MULTIDISCIPLINARY HEART VALVE TEAM  Patient doing well s/p TAVR. She is hemodynamically stable. Groin sites stable. ECG with sinus with old 1st deg AV block (PR minimally longer 239ms-->288ms) but no high grade block. Transferred from cath lab holding to 4E.  Early ambulation after bedrest completed and hopeful discharge over the next 24-48 hours.   Cline Crock PA-C  [facility]  Pager [phone]

## 2023-10-06 ENCOUNTER — Inpatient Hospital Stay (HOSPITAL_COMMUNITY)
Admit: 2023-10-06 | Discharge: 2023-10-06 | Disposition: A | Discharge status: Home / Self Care | Payer: Medicare HMO | Attending: Physician Assistant

## 2023-10-06 ENCOUNTER — Encounter (HOSPITAL_COMMUNITY): Payer: Self-pay | Admitting: Cardiovascular Disease

## 2023-10-06 ENCOUNTER — Inpatient Hospital Stay (HOSPITAL_COMMUNITY): Payer: Medicare HMO

## 2023-10-06 DIAGNOSIS — Z952 Presence of prosthetic heart valve: Secondary | ICD-10-CM

## 2023-10-06 DIAGNOSIS — I440 Atrioventricular block, first degree: Secondary | ICD-10-CM | POA: Diagnosis not present

## 2023-10-06 DIAGNOSIS — I080 Rheumatic disorders of both mitral and aortic valves: Secondary | ICD-10-CM | POA: Diagnosis not present

## 2023-10-06 DIAGNOSIS — Z006 Encounter for examination for normal comparison and control in clinical research program: Secondary | ICD-10-CM | POA: Diagnosis not present

## 2023-10-06 DIAGNOSIS — I130 Hypertensive heart and chronic kidney disease with heart failure and stage 1 through stage 4 chronic kidney disease, or unspecified chronic kidney disease: Secondary | ICD-10-CM | POA: Diagnosis not present

## 2023-10-06 DIAGNOSIS — K862 Cyst of pancreas: Secondary | ICD-10-CM | POA: Diagnosis not present

## 2023-10-06 LAB — BASIC METABOLIC PANEL
Anion gap: 5 (ref 5–15)
BUN: 28 mg/dL — ABNORMAL HIGH (ref 8–23)
CO2: 23 mmol/L (ref 22–32)
Calcium: 9.5 mg/dL (ref 8.9–10.3)
Chloride: 110 mmol/L (ref 98–111)
Creatinine, Ser: 1.43 mg/dL — ABNORMAL HIGH (ref 0.44–1.00)
GFR, Estimated: 35 mL/min — ABNORMAL LOW (ref >60)
Glucose, Bld: 92 mg/dL (ref 70–99)
Potassium: 3.9 mmol/L (ref 3.5–5.1)
Sodium: 138 mmol/L (ref 135–145)

## 2023-10-06 LAB — GLUCOSE, CAPILLARY
Glucose-Capillary: 107 mg/dL — ABNORMAL HIGH (ref 70–99)
Glucose-Capillary: 145 mg/dL — ABNORMAL HIGH (ref 70–99)

## 2023-10-06 LAB — ECHOCARDIOGRAM COMPLETE
AR max vel: 2.98 cm2
AV Area VTI: 2.82 cm2
AV Area mean vel: 2.85 cm2
AV Mean grad: 11.2 mm[Hg]
AV Peak grad: 20.4 mm[Hg]
Ao pk vel: 2.26 m/s
Area-P 1/2: 3.43 cm2
Height: 63 in
S' Lateral: 2.2 cm
Weight: 2955.2 [oz_av]

## 2023-10-06 LAB — CBC
HCT: 32.4 % — ABNORMAL LOW (ref 36.0–46.0)
Hemoglobin: 10.7 g/dL — ABNORMAL LOW (ref 12.0–15.0)
MCH: 31.9 pg (ref 26.0–34.0)
MCHC: 33 g/dL (ref 30.0–36.0)
MCV: 96.7 fL (ref 80.0–100.0)
Platelets: 114 10*3/uL — ABNORMAL LOW (ref 150–400)
RBC: 3.35 MIL/uL — ABNORMAL LOW (ref 3.87–5.11)
RDW: 13 % (ref 11.5–15.5)
WBC: 7.5 10*3/uL (ref 4.0–10.5)
nRBC: 0 % (ref 0.0–0.2)

## 2023-10-06 LAB — MAGNESIUM: Magnesium: 2 mg/dL (ref 1.7–2.4)

## 2023-10-06 MED ORDER — FUROSEMIDE 40 MG PO TABS
40.0000 mg | ORAL_TABLET | Freq: Every morning | ORAL | Status: DC
  Administered 2023-10-06: 40 mg via ORAL
  Filled 2023-10-06: qty 1

## 2023-10-06 MED ORDER — AMLODIPINE BESYLATE 10 MG PO TABS: 10.0000 mg | ORAL_TABLET | Freq: Every day | ORAL | 6 refills | Status: DC

## 2023-10-06 NOTE — Progress Notes (Signed)
CARDIAC REHAB PHASE I   PRE:  Rate/Rhythm: 77 SR    BP: sitting 117/64    SpO2: 97 Ra  MODE:  Ambulation: 240 ft   POST:  Rate/Rhythm: 78 first deg    BP: sitting 153/60     SpO2: 95 RA  Pt stood and ambulated with contact guard. No c/o. HR stable but did have first degree.   Discussed with pt and son restrictions, walking/exercise, and CRPII. Pt receptive. Will refer to [facility] CRPII.  [id number]   Ethelda Chick BS, ACSM-CEP 10/06/2023 12:02 PM

## 2023-10-06 NOTE — Progress Notes (Signed)
S/p TAVR, stable for transition home today, has family to assist. No TOC needs noted.     10/06/23 1109  TOC Brief Assessment  Insurance and Status Reviewed  Patient has primary care physician Yes  Home environment has been reviewed home alone  Prior level of function: self  Prior/Current Home Services No current home services  Social Determinants of Health Reivew SDOH reviewed no interventions necessary  Readmission risk has been reviewed Yes  Transition of care needs no transition of care needs at this time

## 2023-10-06 NOTE — Progress Notes (Signed)
Discharge instructions reviewed with pt and her son. Copy of instructions given to pt. Pt informed her new script was sent to her pharmacy for pick up.  Pt to be d/c'd via wheelchair with belongings, with son.            To be escorted by hospital volunteer.  Suleyma Wafer,RN SWOT

## 2023-10-06 NOTE — Progress Notes (Signed)
Cardiac Rehab RN at bedside doing teaching with pt and family.  Discharge instructions printed and will review with pt as soon as CR is done.  Kyarah Enamorado,RN SWOT

## 2023-10-07 ENCOUNTER — Telehealth: Payer: Self-pay | Admitting: Cardiology

## 2023-10-07 NOTE — Telephone Encounter (Signed)
  [facility]ENTER   MULTIDISCIPLINARY HEART VALVE TEAM   Patient contacted regarding discharge from [facility] on 10/06/2023 s/p TAVR. Patient reports she is doing well with no issues with SOB, pain, or bleeding.    Patient understands to follow up with provider Carlean Jews, PA on 11/22 at 950am.   Patient understands discharge instructions? Yes  Patient understands medications and regimen? Yes  Patient understands to bring all medications to this visit? Yes   Georgie Chard NP-C Structural Heart Team  Pager: [phone]

## 2023-10-08 ENCOUNTER — Telehealth (HOSPITAL_COMMUNITY): Payer: Self-pay

## 2023-10-08 NOTE — Telephone Encounter (Signed)
Pt is not interested in the cardiac rehab program at this time. Closed referral. 

## 2023-10-12 NOTE — Progress Notes (Signed)
HEART AND VASCULAR CENTER   MULTIDISCIPLINARY HEART VALVE CLINIC                                     Cardiology Office Note:    Date:  10/15/2023   ID:  Bonnie Chavez, DOB 21-Jan-1934, MRN [id number]  PCP:  Deeann Saint, MD  [facility] Cardiologist:  Nicki Guadalajara, MD / Dr. Excell Seltzer & Dr. Laneta Simmers (TAVR)  [facility] Electrophysiologist:  None   Referring MD: Deeann Saint, MD   [facility] s/p TAVR  History of Present Illness:    Bonnie Chavez is a 89 y.o. female with a hx of HTN, HLD, CKD stage IIIb, 1st degree AV block, carpal tunnel, T2DM, OA and severe aortic stenosis s/p TAVR (10/05/23) who presents to clinic for follow up.  She reported progressive exertional fatigue and shortness of breath over the past year. Echo 03/24/23 showed EF 60% and severe AS with a mean grad 32 mmHg, AVA 0.72 cm2, DVI 0.23. LHC 08/16/23 showed patent coronary arteries, right dominant, with moderate coronary calcification and mild plaquing but no significant obstructive disease. Unable to cross valve at the time of cath. Pre TAVR CTs showed a possible LAA thrombus that was reviewed and felt to be mixing artifact.   She underwent successful TAVR with a 23 mm Edwards Sapien 3 Ultra Resilia THV via the TF approach on 10/05/23. Post operative echo showed EF 65%, mild LVH, normally functioning TAVR with a mean gradient of 11.2 mmHg and no PVL as well as mild MR. She had some questionable 2:1 AV block and a sinus pause on tele so she was discharged home with a Zio AT. Bystolic 10mg  in AM/ 5mg  in PM was held and Novasc increased from 5-->10mg  at discharge.  Today the patient presents to clinic for follow up. No CP or SOB. Has had some mild worsening of her chronic LE edema L>R but no orthopnea or PND. No dizziness or syncope. No blood in stool or urine. No palpitations. Zio patch falling off. No high risk alerts.     Past Medical History:  Diagnosis Date   (HFpEF) heart failure with preserved ejection fraction  (HCC)    Anemia    History of , resolved   Carpal tunnel syndrome    Chronic low back pain 02/27/2016   -DDD -seeing [facility]    CKD stage 3b, GFR 30-44 ml/min (HCC)    Diabetes mellitus    type 2, sees Dr. Talmage Nap   Goiter    Dr. Talmage Nap   History of blood transfusion    auto transfusion post knee replacement   Hyperlipidemia    Hyperparathyroidism (HCC) 02/27/2016   -sees endocrinologist, Dr. Talmage Nap, has seen surgeon as well    Hypertension    Migraine    no longer has migraines (80's) after menopause   Mitral valve prolapse    Neuropathy    Osteoarthritis    DDD, sees [place] [facility]   PMB (postmenopausal bleeding) 02/28/2009   Resolved   S/P TAVR (transcatheter aortic valve replacement) 10/05/2023   s/p TAVR with a 23 mm Edwards S3UR via the TF approach by Dr. Excell Seltzer and Laneta Simmers   Severe aortic stenosis    Vaginal atrophy    Vitamin D deficiency      Current Medications: Current Meds  Medication Sig   acetaminophen (TYLENOL) 500 MG tablet Take 500 mg by mouth every 6 (  six) hours as needed (Arthritis).   amLODipine (NORVASC) 10 MG tablet Take 1 tablet (10 mg total) by mouth daily.   aspirin 81 MG EC tablet Take 81 mg by mouth at bedtime. Swallow whole.   azithromycin (ZITHROMAX) 500 MG tablet Take 1 tablet (500 mg total) 1 hour prior to dental appointments   BD PEN NEEDLE NANO 2ND GEN 32G X 4 MM MISC USE AS DIRECTED 90   cinacalcet (SENSIPAR) 30 MG tablet Take 30 mg by mouth every Monday, Wednesday, and Friday.   furosemide (LASIX) 20 MG tablet Take 40 mg by mouth every morning. Can take additional tablet if needed   gabapentin (NEURONTIN) 100 MG capsule Take 100 mg by mouth at bedtime.   HUMULIN 70/30 KWIKPEN (70-30) 100 UNIT/ML KwikPen Inject 4-8 Units into the skin See admin instructions. 4 units in the AM and 8 units in the pm   levothyroxine (SYNTHROID) 50 MCG tablet Take 50 mcg by mouth daily before breakfast.   OneTouch Delica Lancets 33G MISC Apply  topically.   ONETOUCH VERIO test strip 1 each 2 (two) times daily.   rosuvastatin (CRESTOR) 40 MG tablet TAKE 1 TABLET BY MOUTH EVERYDAY AT BEDTIME   Trolamine Salicylate (BLUE-EMU HEMP EX) Apply 1 application  topically daily as needed (pain).      ROS:   Please see the history of present illness.    All other systems reviewed and are negative.  EKGs   EKG Interpretation Date/Time:  Friday October 15 2023 09:46:19 EST Ventricular Rate:  72 PR Interval:  274 QRS Duration:  92 QT Interval:  374 QTC Calculation: 409 R Axis:   -34  Text Interpretation: Sinus rhythm with 1st degree A-V block Left axis deviation Left ventricular hypertrophy  Confirmed by Cline Crock [phone]) on 10/15/2023 10:06:59 AM    Risk Assessment/Calculations:           Physical Exam:    VS:  BP 130/62   Pulse 78   Ht 5\' 3"  (1.6 m)   Wt 190 lb (86.2 kg)   SpO2 97%   BMI 33.66 kg/m     Wt Readings from Last 3 Encounters:  10/15/23 190 lb (86.2 kg)  10/06/23 184 lb 11.2 oz (83.8 kg)  09/13/23 190 lb (86.2 kg)     GEN: Well nourished, well developed in no acute distress NECK: No JVD; No carotid bruits CARDIAC: RRR, soft flow murmur. No, rubs, gallops RESPIRATORY:  Clear to auscultation without rales, wheezing or rhonchi  ABDOMEN: Soft, non-tender, non-distended EXTREMITIES:  No edema; No deformity.  Groin sites clear without hematoma or ecchymosis    ASSESSMENT:    1. S/P TAVR (transcatheter aortic valve replacement)   2. 1st degree AV block   3. Primary hypertension   4. Stage 3b chronic kidney disease (HCC)   5. Cyst of pancreas   6. Amyloid heart disease (HCC)     PLAN:    In order of problems listed above:  Severe AS s/p TAVR: doing great 1 week out from TAVR. Groin sites are stable. ECG with no HAVB. SBE prophylaxis discussed; I have RX'd azithromycin due to a PCN allergy. Continue Asprin 81mg  daily. I will see her back for 1 month follow up and echo.    1st degree  AV block: wearing a Zio AT currently with no high risk alerts. This was falling off so I have sent back to the company. We have enough data   HTN: given possible HAVB during admission Bystolic  stopped and Norvasc increased from 5mg  to 10mg  daily. Has has some mild worsening on LE edema so will watch this. If it gets worse or bothers her, will back it back down.   CKD stage IIIb: creat baseline 1.3-1.64. Creat stable ~1.43 at discharge.   Pancreatic cysts: pre TAVR scans showed "multiple cystic appearing lesions of varying degrees of complexity in the pancreas, along with dilatation of the pancreatic duct and side branches in the distal body and tail of the pancreas. Further evaluation with nonemergent abdominal MRI with and without. IV gadolinium with MRCP is strongly recommended in the near future to exclude underlying neoplasm."  Discussed with pt and will get MRI set up.    Elevated Raise Score: 4 (carpal tunnel and age).Discussed amyloid work up and gave her information about it to read. She would like to think about labs and PYP.    Medication Adjustments/Labs and Tests Ordered: Current medicines are reviewed at length with the patient today.  Concerns regarding medicines are outlined above.  Orders Placed This Encounter  Procedures   MR ABDOMEN MRCP W WO CONTAST   EKG 12-Lead   Meds ordered this encounter  Medications   azithromycin (ZITHROMAX) 500 MG tablet    Sig: Take 1 tablet (500 mg total) 1 hour prior to dental appointments    Dispense:  3 tablet    Refill:  3    Patient Instructions  Medication Instructions:  Your physician has recommended you make the following change in your medication:  1) TAKE Azithromycin 500 mg (1 tablet) 1 hour prior to dental work  *If you need a refill on your cardiac medications before your next appointment, please call your pharmacy*  Testing/Procedures: MRI of the abdomen   Follow-Up: At [facility], you and your health needs  are our priority.  As part of our continuing mission to provide you with exceptional heart care, we have created designated Provider Care Teams.  These Care Teams include your primary Cardiologist (physician) and Advanced Practice Providers (APPs -  Physician Assistants and Nurse Practitioners) who all work together to provide you with the care you need, when you need it.  Your next appointment:   As scheduled     Signed, Cline Crock, PA-C  10/15/2023 10:09 AM    [facility]

## 2023-10-15 ENCOUNTER — Ambulatory Visit: Payer: Medicare HMO | Attending: Cardiovascular Disease | Admitting: Physician Assistant

## 2023-10-15 VITALS — BP 130/62 | HR 78 | Ht 63.0 in | Wt 190.0 lb

## 2023-10-15 DIAGNOSIS — I440 Atrioventricular block, first degree: Secondary | ICD-10-CM | POA: Diagnosis not present

## 2023-10-15 DIAGNOSIS — N1832 Chronic kidney disease, stage 3b: Secondary | ICD-10-CM

## 2023-10-15 DIAGNOSIS — I10 Essential (primary) hypertension: Secondary | ICD-10-CM

## 2023-10-15 DIAGNOSIS — I43 Cardiomyopathy in diseases classified elsewhere: Secondary | ICD-10-CM

## 2023-10-15 DIAGNOSIS — Z952 Presence of prosthetic heart valve: Secondary | ICD-10-CM

## 2023-10-15 DIAGNOSIS — K862 Cyst of pancreas: Secondary | ICD-10-CM

## 2023-10-15 DIAGNOSIS — E854 Organ-limited amyloidosis: Secondary | ICD-10-CM

## 2023-10-15 MED ORDER — AZITHROMYCIN 500 MG PO TABS
ORAL_TABLET | ORAL | 3 refills | Status: DC
Start: 2023-10-15 — End: 2024-06-16

## 2023-10-15 NOTE — Patient Instructions (Signed)
Medication Instructions:  Your physician has recommended you make the following change in your medication:  1) TAKE Azithromycin 500 mg (1 tablet) 1 hour prior to dental work  *If you need a refill on your cardiac medications before your next appointment, please call your pharmacy*  Testing/Procedures: MRI of the abdomen   Follow-Up: At [facility], you and your health needs are our priority.  As part of our continuing mission to provide you with exceptional heart care, we have created designated Provider Care Teams.  These Care Teams include your primary Cardiologist (physician) and Advanced Practice Providers (APPs -  Physician Assistants and Nurse Practitioners) who all work together to provide you with the care you need, when you need it.  Your next appointment:   As scheduled

## 2023-10-25 ENCOUNTER — Ambulatory Visit (HOSPITAL_COMMUNITY)
Admission: RE | Admit: 2023-10-25 | Discharge: 2023-10-25 | Disposition: A | Discharge status: Home / Self Care | Payer: Medicare HMO | Source: Ambulatory Visit | Attending: Physician Assistant | Admitting: Physician Assistant

## 2023-10-25 ENCOUNTER — Other Ambulatory Visit: Payer: Self-pay | Admitting: Physician Assistant

## 2023-10-25 DIAGNOSIS — K862 Cyst of pancreas: Secondary | ICD-10-CM | POA: Diagnosis present

## 2023-10-25 MED ORDER — GADOBUTROL 1 MMOL/ML IV SOLN
8.6000 mL | Freq: Once | INTRAVENOUS | Status: AC | PRN
  Administered 2023-10-25: 8.6 mL via INTRAVENOUS

## 2023-11-04 NOTE — Progress Notes (Signed)
[facility]   MULTIDISCIPLINARY HEART VALVE CLINIC                                     Cardiology Office Note:    Date:  11/05/2023   ID:  Bonnie Chavez, DOB November 25, 1933, MRN [id number]  PCP:  Bonnie Saint, MD  [facility] Cardiologist:  Bonnie Guadalajara, MD / Dr. Excell Chavez & Dr. Laneta Chavez (TAVR)  [facility] Electrophysiologist:  None   Referring MD: Bonnie Saint, MD   1 month s/p TAVR  History of Present Illness:    Bonnie Chavez is a 89 y.o. female with a hx of HTN, HLD, CKD stage IIIb, 1st degree AV block, carpal tunnel, T2DM, OA and severe aortic stenosis s/p TAVR (10/05/23) who presents to clinic for follow up.  She reported progressive exertional fatigue and shortness of breath over the past year. Echo 03/24/23 showed EF 60% and severe AS with a mean grad 32 mmHg, AVA 0.72 cm2, DVI 0.23. LHC 08/16/23 showed patent coronary arteries, right dominant, with moderate coronary calcification and mild plaquing but no significant obstructive disease. Unable to cross valve at the time of cath. Pre TAVR CTs showed a possible LAA thrombus that was reviewed and felt to be mixing artifact.   She underwent successful TAVR with a 23 mm Edwards Sapien 3 Ultra Resilia THV via the TF approach on 10/05/23. Post operative echo showed EF 65%, mild LVH, normally functioning TAVR with a mean gradient of 11.2 mmHg and no PVL as well as mild MR. She had some questionable 2:1 AV block and a sinus pause on tele so she was discharged home with a Zio AT. Bystolic 10mg  in AM/ 5mg  in PM was held and Novasc increased from 5-->10mg  at discharge.  Today the patient presents to clinic for follow up. No CP. Some mild SOB but much improved since TAVR. Some mild LE edema that does not bother her. No orthopnea or PND. No dizziness or syncope. No blood in stool or urine. No palpitations.     Past Medical History:  Diagnosis Date   (HFpEF) heart failure with preserved ejection fraction (HCC)    Anemia     History of , resolved   Carpal tunnel syndrome    Chronic low back pain 02/27/2016   -DDD -seeing [facility]    CKD stage 3b, GFR 30-44 ml/min (HCC)    Diabetes mellitus    type 2, sees Bonnie Chavez   Goiter    Bonnie Chavez   History of blood transfusion    auto transfusion post knee replacement   Hyperlipidemia    Hyperparathyroidism (HCC) 02/27/2016   -sees endocrinologist, Bonnie Chavez, has seen surgeon as well    Hypertension    Migraine    no longer has migraines (80's) after menopause   Mitral valve prolapse    Neuropathy    Osteoarthritis    DDD, sees [place] [facility]   PMB (postmenopausal bleeding) 02/28/2009   Resolved   S/P TAVR (transcatheter aortic valve replacement) 10/05/2023   s/p TAVR with a 23 mm Edwards S3UR via the TF approach by Dr. Excell Chavez and Bonnie Chavez   Severe aortic stenosis    Vaginal atrophy    Vitamin D deficiency      Current Medications: Current Meds  Medication Sig   acetaminophen (TYLENOL) 500 MG tablet Take 500 mg by mouth every 6 (six) hours as needed (  Arthritis).   amLODipine (NORVASC) 10 MG tablet Take 1 tablet (10 mg total) by mouth daily.   aspirin 81 MG EC tablet Take 81 mg by mouth at bedtime. Swallow whole.   azithromycin (ZITHROMAX) 500 MG tablet Take 1 tablet (500 mg total) 1 hour prior to dental appointments   BD PEN NEEDLE NANO 2ND GEN 32G X 4 MM MISC USE AS DIRECTED 90   cinacalcet (SENSIPAR) 30 MG tablet Take 30 mg by mouth every Monday, Wednesday, and Friday.   furosemide (LASIX) 20 MG tablet Take 40 mg by mouth every morning. Can take additional tablet if needed   gabapentin (NEURONTIN) 100 MG capsule Take 100 mg by mouth at bedtime.   HUMULIN 70/30 KWIKPEN (70-30) 100 UNIT/ML KwikPen Inject 4-8 Units into the skin See admin instructions. 4 units in the AM and 8 units in the pm   levothyroxine (SYNTHROID) 50 MCG tablet Take 50 mcg by mouth daily before breakfast.   OneTouch Delica Lancets 33G MISC Apply topically.   ONETOUCH VERIO  test strip 1 each 2 (two) times daily.   rosuvastatin (CRESTOR) 40 MG tablet TAKE 1 TABLET BY MOUTH EVERYDAY AT BEDTIME   Trolamine Salicylate (BLUE-EMU HEMP EX) Apply 1 application  topically daily as needed (pain).      ROS:   Please see the history of present illness.    All other systems reviewed and are negative.  EKGs        Risk Assessment/Calculations:           Physical Exam:    VS:  BP (!) 130/58   Pulse 80   Wt 186 lb 12.8 oz (84.7 kg)   SpO2 98%   BMI 33.09 kg/m     Wt Readings from Last 3 Encounters:  11/05/23 186 lb 12.8 oz (84.7 kg)  10/15/23 190 lb (86.2 kg)  10/06/23 184 lb 11.2 oz (83.8 kg)     GEN: Well nourished, well developed in no acute distress NECK: No JVD CARDIAC: RRR, soft flow murmur @ RUSB. No, rubs, gallops RESPIRATORY:  Clear to auscultation without rales, wheezing or rhonchi  ABDOMEN: Soft, non-tender, non-distended EXTREMITIES:  No edema; No deformity.     ASSESSMENT:    1. S/P TAVR (transcatheter aortic valve replacement)   2. 1st degree AV block   3. Primary hypertension   4. Stage 3b chronic kidney disease (HCC)   5. Cyst of pancreas   6. Amyloid heart disease (HCC)    PLAN:    In order of problems listed above:  Severe AS s/p TAVR: echo today shows EF 55%, normally functioning TAVR with a mean gradient of 15 mm hg and no PVL. She has NYHA class II symptoms but with improvement since TAVR. SBE prophylaxis discussed; she has azithromycin due to a PCN allergy. Continue Asprin 81mg  daily. I will see her back for 1 year follow up and echo.    1st degree AV block: Zio AT report still pending but no high risk alerts.   HTN: given possible HAVB during admission Bystolic stopped and Norvasc increased from 5mg  to 10mg  daily. Continue this and Lasix 40mg  daily. BP well controlled today.    CKD stage IIIb: creat baseline 1.3-1.64. Creat has remained stable.  Pancreatic cysts: pre TAVR scans showed "multiple cystic appearing  lesions of varying degrees of complexity in the pancreas, along with dilatation of the pancreatic duct and side branches in the distal body and tail of the pancreas. Follow up MRI 12/2 was  reassuring.    Elevated Raise Score: 4 (carpal tunnel and age).Discussed amyloid work up and gave her information about it to read. She would like to hold off on testing for now.    Medication Adjustments/Labs and Tests Ordered: Current medicines are reviewed at length with the patient today.  Concerns regarding medicines are outlined above.  No orders of the defined types were placed in this encounter.  No orders of the defined types were placed in this encounter.   Patient Instructions  Medication Instructions:  Your physician recommends that you continue on your current medications as directed. Please refer to the Current Medication list given to you today.   *If you need a refill on your cardiac medications before your next appointment, please call your pharmacy*   Lab Work: None ordered  If you have labs (blood work) drawn today and your tests are completely normal, you will receive your results only by: MyChart Message (if you have MyChart) OR A paper copy in the mail If you have any lab test that is abnormal or we need to change your treatment, we will call you to review the results.   Testing/Procedures: None ordered    Follow-Up: At [facility], you and your health needs are our priority.  As part of our continuing mission to provide you with exceptional heart care, we have created designated Provider Care Teams.  These Care Teams include your primary Cardiologist (physician) and Advanced Practice Providers (APPs -  Physician Assistants and Nurse Practitioners) who all work together to provide you with the care you need, when you need it.  We recommend signing up for the patient portal called "MyChart".  Sign up information is provided on this After Visit Summary.  MyChart is  used to connect with patients for Virtual Visits (Telemedicine).  Patients are able to view lab/test results, encounter notes, upcoming appointments, etc.  Non-urgent messages can be sent to your provider as well.   To learn more about what you can do with MyChart, go to [email or web address].    Your next appointment:   6 month(s)  Provider:   Nicki Guadalajara, MD     Other Instructions     Signed, Cline Crock, PA-C  11/05/2023 2:15 PM    [facility]

## 2023-11-05 ENCOUNTER — Ambulatory Visit: Payer: Medicare HMO | Admitting: Physician Assistant

## 2023-11-05 ENCOUNTER — Ambulatory Visit: Payer: Medicare HMO | Attending: Cardiology

## 2023-11-05 VITALS — BP 130/58 | HR 80 | Wt 186.8 lb

## 2023-11-05 DIAGNOSIS — N1832 Chronic kidney disease, stage 3b: Secondary | ICD-10-CM

## 2023-11-05 DIAGNOSIS — I43 Cardiomyopathy in diseases classified elsewhere: Secondary | ICD-10-CM | POA: Insufficient documentation

## 2023-11-05 DIAGNOSIS — K862 Cyst of pancreas: Secondary | ICD-10-CM

## 2023-11-05 DIAGNOSIS — Z952 Presence of prosthetic heart valve: Secondary | ICD-10-CM | POA: Insufficient documentation

## 2023-11-05 DIAGNOSIS — I10 Essential (primary) hypertension: Secondary | ICD-10-CM | POA: Diagnosis present

## 2023-11-05 DIAGNOSIS — I440 Atrioventricular block, first degree: Secondary | ICD-10-CM

## 2023-11-05 DIAGNOSIS — E854 Organ-limited amyloidosis: Secondary | ICD-10-CM

## 2023-11-05 LAB — ECHOCARDIOGRAM COMPLETE
AR max vel: 1.57 cm2
AV Area VTI: 1.69 cm2
AV Area mean vel: 1.55 cm2
AV Mean grad: 12.8 mm[Hg]
AV Peak grad: 22.7 mm[Hg]
Ao pk vel: 2.38 m/s
Est EF: 55
S' Lateral: 2.98 cm

## 2023-11-05 NOTE — Patient Instructions (Signed)
Medication Instructions:  Your physician recommends that you continue on your current medications as directed. Please refer to the Current Medication list given to you today.  *If you need a refill on your cardiac medications before your next appointment, please call your pharmacy*   Lab Work: None ordered   If you have labs (blood work) drawn today and your tests are completely normal, you will receive your results only by: MyChart Message (if you have MyChart) OR A paper copy in the mail If you have any lab test that is abnormal or we need to change your treatment, we will call you to review the results.   Testing/Procedures: None ordered    Follow-Up: At [facility], you and your health needs are our priority.  As part of our continuing mission to provide you with exceptional heart care, we have created designated Provider Care Teams.  These Care Teams include your primary Cardiologist (physician) and Advanced Practice Providers (APPs -  Physician Assistants and Nurse Practitioners) who all work together to provide you with the care you need, when you need it.  We recommend signing up for the patient portal called "MyChart".  Sign up information is provided on this After Visit Summary.  MyChart is used to connect with patients for Virtual Visits (Telemedicine).  Patients are able to view lab/test results, encounter notes, upcoming appointments, etc.  Non-urgent messages can be sent to your provider as well.   To learn more about what you can do with MyChart, go to [email or web address].    Your next appointment:   6 month(s)  Provider:   Thomas Kelly, MD     Other Instructions   

## 2023-11-11 ENCOUNTER — Other Ambulatory Visit: Payer: Self-pay | Admitting: Physician Assistant

## 2023-11-11 ENCOUNTER — Telehealth: Payer: Self-pay | Admitting: Physician Assistant

## 2023-11-11 ENCOUNTER — Encounter (HOSPITAL_COMMUNITY)
Admission: RE | Admit: 2023-11-11 | Discharge: 2023-11-11 | Disposition: A | Discharge status: Home / Self Care | Payer: Medicare HMO | Source: Ambulatory Visit | Attending: Cardiovascular Disease | Admitting: Cardiovascular Disease

## 2023-11-11 DIAGNOSIS — Z48812 Encounter for surgical aftercare following surgery on the circulatory system: Secondary | ICD-10-CM | POA: Diagnosis not present

## 2023-11-11 DIAGNOSIS — I4891 Unspecified atrial fibrillation: Secondary | ICD-10-CM | POA: Diagnosis present

## 2023-11-11 DIAGNOSIS — Z952 Presence of prosthetic heart valve: Secondary | ICD-10-CM

## 2023-11-11 DIAGNOSIS — R000 Tachycardia, unspecified: Secondary | ICD-10-CM | POA: Diagnosis not present

## 2023-11-11 LAB — GLUCOSE, CAPILLARY: Glucose-Capillary: 179 mg/dL — ABNORMAL HIGH (ref 70–99)

## 2023-11-11 MED ORDER — AMLODIPINE BESYLATE 5 MG PO TABS
5.0000 mg | ORAL_TABLET | Freq: Every day | ORAL | 6 refills | Status: DC
Start: 2023-11-11 — End: 2024-05-16

## 2023-11-11 MED ORDER — NEBIVOLOL HCL 10 MG PO TABS: 10.0000 mg | ORAL_TABLET | Freq: Every day | ORAL | 3 refills | Status: DC

## 2023-11-11 NOTE — Progress Notes (Signed)
Cardiac Rehab Medication Review by a Nurse  Does the patient  feel that his/her medications are working for him/her?  yes  Has the patient been experiencing any side effects to the medications prescribed?  no  Does the patient measure his/her own blood pressure or blood glucose at home?  yes   Does the patient have any problems obtaining medications due to transportation or finances?   no  Understanding of regimen: good Understanding of indications: good Potential of compliance: good    Nurse comments: Graceson is taking her medications as prescribed. Irine checks her blood pressures daily and takes her CBG's twice a day.     Arta Bruce [facility] RN 11/11/2023 11:33 AM

## 2023-11-11 NOTE — Progress Notes (Signed)
Patient here for cardiac rehab orientation. Patient appears to be in atrial fib. Rate 113. P wave difficult to visualize in lead 2.  Patient asymptomatic. Blood pressure 122/64. Oxygen saturation 98% on RA. Medications reviewed. Taking as prescribed. Will consult with onsite provider Neila Gear, NP. 12 lead ECG  ordered and obtained and reviewed by Robin Searing NP showed Sinus tach 110. Alden Server said to hold off on 6 minute walk test this morning. Will consult with Cline Crock PAC.  12 lead ECG also reviewed by Cline Crock PAC. Per Cline Crock  PAC hey, its just sinus tach with a 1st deg AV block. I had stopped her nebivolol 10 MG tablet when she was discharged after TAVR so maybe just some rebound tachy. Lets add that back and have her cut norvasc back to 5mg  daily.  Patient instructed to add nebivolol 10 mg po once a day and to decrease her amlodipine to 5 mg a day at night. Patient states understanding. Ms Busse has Nebivolol at home and will cut her amlodipine in half as she takes it at night.   Patient will complete cardiac rehab orientation paperwork and come back next Thursday to complete her 6 minute walk test. Patient given a calendar with her upcoming appointments. Yalexa knows to return at 1000 on 11/18/23.  Patient left cardiac rehab without complaints or symptoms.Thayer Headings RN BSN

## 2023-11-11 NOTE — Telephone Encounter (Signed)
  [facility]   MULTIDISCIPLINARY HEART VALVE TEAM  Patient recently underwent TAVR.  There was some question of high degree heart block after TAVR and so her nebivolol was discontinued and amlodipine increased from 5mg  to10mg  to cover her BP. Subsequent 14 Zio heart monitor did not show any HAVB. She has been doing well on this regimen but did have some worsening LE edema. She was seen for cardiac rehab eval today and noted to have resting sinus tachycardia, HR ~110bpm. This was asymptomatic. I discussed with Ralene Muskrat RN and plan to decrease Amlodipine back to 5mg  daily and restart her nebivolol 10mg  daily. This has been called into her pharmacy and med list has been updated. She is okay to exercise.    Cline Crock PA-C  [facility]

## 2023-11-15 ENCOUNTER — Encounter (HOSPITAL_COMMUNITY): Payer: Medicare HMO

## 2023-11-18 ENCOUNTER — Encounter (HOSPITAL_COMMUNITY)
Admission: RE | Admit: 2023-11-18 | Discharge: 2023-11-18 | Disposition: A | Discharge status: Home / Self Care | Payer: Medicare HMO | Source: Ambulatory Visit | Attending: Cardiovascular Disease | Admitting: Cardiovascular Disease

## 2023-11-18 VITALS — BP 140/64 | HR 62 | Ht 63.0 in | Wt 186.7 lb

## 2023-11-18 DIAGNOSIS — I4891 Unspecified atrial fibrillation: Secondary | ICD-10-CM | POA: Diagnosis not present

## 2023-11-18 DIAGNOSIS — Z952 Presence of prosthetic heart valve: Secondary | ICD-10-CM

## 2023-11-18 LAB — GLUCOSE, CAPILLARY: Glucose-Capillary: 206 mg/dL — ABNORMAL HIGH (ref 70–99)

## 2023-11-18 NOTE — Progress Notes (Signed)
Cardiac Individual Treatment Plan  Patient Details  Name: Bonnie Chavez MRN: [id number] Date of Birth: 02-16-1934 Referring Provider:   Flowsheet Row INTENSIVE CARDIAC REHAB from 11/18/2023 in [facility]  Referring Provider Nicki Guadalajara, MD       Initial Encounter Date:  Flowsheet Row INTENSIVE CARDIAC REHAB from 11/18/2023 in [facility]  Date 11/18/23       Visit Diagnosis: 10/05/23 TAVR  Patient's Home Medications on Admission:  Current Outpatient Medications:    acetaminophen (TYLENOL) 500 MG tablet, Take 500 mg by mouth every 6 (six) hours as needed (Arthritis)., Disp: , Rfl:    amLODipine (NORVASC) 5 MG tablet, Take 1 tablet (5 mg total) by mouth daily., Disp: 30 tablet, Rfl: 6   aspirin 81 MG EC tablet, Take 81 mg by mouth at bedtime. Swallow whole., Disp: , Rfl:    azithromycin (ZITHROMAX) 500 MG tablet, Take 1 tablet (500 mg total) 1 hour prior to dental appointments, Disp: 3 tablet, Rfl: 3   BD PEN NEEDLE NANO 2ND GEN 32G X 4 MM MISC, USE AS DIRECTED 90, Disp: , Rfl:    cinacalcet (SENSIPAR) 30 MG tablet, Take 30 mg by mouth every Monday, Wednesday, and Friday., Disp: , Rfl:    furosemide (LASIX) 20 MG tablet, Take 40 mg by mouth every morning. Can take additional tablet if needed, Disp: 30 tablet, Rfl:    gabapentin (NEURONTIN) 100 MG capsule, Take 100 mg by mouth at bedtime., Disp: , Rfl:    HUMULIN 70/30 KWIKPEN (70-30) 100 UNIT/ML KwikPen, Inject 4-8 Units into the skin See admin instructions. 4 units in the AM and 8 units in the pm, Disp: , Rfl:    levothyroxine (SYNTHROID) 50 MCG tablet, Take 50 mcg by mouth daily before breakfast., Disp: , Rfl:    nebivolol (BYSTOLIC) 10 MG tablet, Take 1 tablet (10 mg total) by mouth daily., Disp: 30 tablet, Rfl: 3   OneTouch Delica Lancets 33G MISC, Apply topically., Disp: , Rfl:    ONETOUCH VERIO test strip, 1  each 2 (two) times daily., Disp: , Rfl:    rosuvastatin (CRESTOR) 40 MG tablet, TAKE 1 TABLET BY MOUTH EVERYDAY AT BEDTIME, Disp: 90 tablet, Rfl: 1   Trolamine Salicylate (BLUE-EMU HEMP EX), Apply 1 application  topically daily as needed (pain)., Disp: , Rfl:   Past Medical History: Past Medical History:  Diagnosis Date   (HFpEF) heart failure with preserved ejection fraction (HCC)    Anemia    History of , resolved   Carpal tunnel syndrome    Chronic low back pain 02/27/2016   -DDD -seeing  orthopedics    CKD stage 3b, GFR 30-44 ml/min (HCC)    Diabetes mellitus    type 2, sees Dr. Talmage Nap   Goiter    Dr. Talmage Nap   History of blood transfusion    auto transfusion post knee replacement   Hyperlipidemia    Hyperparathyroidism (HCC) 02/27/2016   -sees endocrinologist, Dr. Talmage Nap, has seen surgeon as well    Hypertension    Migraine    no longer has migraines (80's) after menopause   Mitral valve prolapse    Neuropathy    Osteoarthritis    DDD, sees [place] ortho   PMB (postmenopausal bleeding) 02/28/2009   Resolved   S/P TAVR (transcatheter aortic valve replacement) 10/05/2023   s/p TAVR with a 23 mm Edwards S3UR via the TF approach  by Dr. Excell Seltzer and [facility]   Severe aortic stenosis    Vaginal atrophy    Vitamin D deficiency     Tobacco Use: Social History   Tobacco Use  Smoking Status Never  Smokeless Tobacco Never    Labs: Review Flowsheet  More data exists      Latest Ref Rng & Units 06/09/2018 12/27/2019 12/15/2021 01/05/2023 10/05/2023  Labs for ITP Cardiac and Pulmonary Rehab  Cholestrol 0 - 200 - - - 173     -  HDL-C 35 - 70 - - - 60     -  Trlycerides 40 - 160 - - - 77     -  Hemoglobin A1c - 7.3  7.8  7.3  7.6     -  TCO2 22 - 32 mmol/L - - - - 25     Details       This result is from an external source.         Capillary Blood Glucose: Lab Results  Component Value Date   GLUCAP 206 (H) 11/18/2023   GLUCAP 179 (H) 11/11/2023   GLUCAP 145 (H)  10/06/2023   GLUCAP 107 (H) 10/06/2023   GLUCAP 205 (H) 10/05/2023     Exercise Target Goals: Exercise Program Goal: Individual exercise prescription set using results from initial 6 min walk test and THRR while considering  patient's activity barriers and safety.   Exercise Prescription Goal: Initial exercise prescription builds to 30-45 minutes a day of aerobic activity, 2-3 days per week.  Home exercise guidelines will be given to patient during program as part of exercise prescription that the participant will acknowledge.  Activity Barriers & Risk Stratification:  Activity Barriers & Cardiac Risk Stratification - 11/18/23 1021       Activity Barriers & Cardiac Risk Stratification   Activity Barriers Left Knee Replacement;Right Knee Replacement;Neck/Spine Problems;Joint Problems;Deconditioning;Muscular Weakness;Back Problems;Arthritis    Cardiac Risk Stratification High             6 Minute Walk:  6 Minute Walk     Row Name 11/18/23 1200         6 Minute Walk   Phase Initial  Used Go-cart     Distance 970 feet     Walk Time 6 minutes     # of Rest Breaks 0     MPH 1.84     METS 1     RPE 11     Perceived Dyspnea  0     VO2 Peak 3.22     Symptoms No     Resting HR 65 bpm     Resting BP 140/64     Resting Oxygen Saturation  100 %     Exercise Oxygen Saturation  during 6 min walk 98 %     Max Ex. HR 76 bpm     Max Ex. BP 152/78     2 Minute Post BP 134/70              Oxygen Initial Assessment:   Oxygen Re-Evaluation:   Oxygen Discharge (Final Oxygen Re-Evaluation):   Initial Exercise Prescription:  Initial Exercise Prescription - 11/18/23 1200       Date of Initial Exercise RX and Referring Provider   Date 11/18/23    Referring Provider Nicki Guadalajara, MD    Expected Discharge Date 02/02/24      NuStep   Level 3    SPM 75    Minutes 25    METs  1      Prescription Details   Frequency (times per week) 3    Duration Progress to 30  minutes of continuous aerobic without signs/symptoms of physical distress      Intensity   THRR 40-80% of Max Heartrate 53-106    Ratings of Perceived Exertion 11-13    Perceived Dyspnea 0-4      Progression   Progression Continue progressive overload as per policy without signs/symptoms or physical distress.      Resistance Training   Training Prescription Yes    Weight 2 lbs    Reps 10-15             Perform Capillary Blood Glucose checks as needed.  Exercise Prescription Changes:   Exercise Comments:   Exercise Goals and Review:   Exercise Goals     Row Name 11/11/23 0924             Exercise Goals   Increase Physical Activity Yes       Intervention Provide advice, education, support and counseling about physical activity/exercise needs.;Develop an individualized exercise prescription for aerobic and resistive training based on initial evaluation findings, risk stratification, comorbidities and participant's personal goals.       Expected Outcomes Short Term: Attend rehab on a regular basis to increase amount of physical activity.;Long Term: Exercising regularly at least 3-5 days a week.;Long Term: Add in home exercise to make exercise part of routine and to increase amount of physical activity.       Increase Strength and Stamina Yes       Intervention Provide advice, education, support and counseling about physical activity/exercise needs.;Develop an individualized exercise prescription for aerobic and resistive training based on initial evaluation findings, risk stratification, comorbidities and participant's personal goals.       Expected Outcomes Short Term: Increase workloads from initial exercise prescription for resistance, speed, and METs.;Short Term: Perform resistance training exercises routinely during rehab and add in resistance training at home;Long Term: Improve cardiorespiratory fitness, muscular endurance and strength as measured by increased METs and  functional capacity ( )       Able to understand and use rate of perceived exertion (RPE) scale Yes       Intervention Provide education and explanation on how to use RPE scale       Expected Outcomes Short Term: Able to use RPE daily in rehab to express subjective intensity level;Long Term:  Able to use RPE to guide intensity level when exercising independently       Knowledge and understanding of Target Heart Rate Range (THRR) Yes       Intervention Provide education and explanation of THRR including how the numbers were predicted and where they are located for reference       Expected Outcomes Short Term: Able to state/look up THRR;Short Term: Able to use daily as guideline for intensity in rehab;Long Term: Able to use THRR to govern intensity when exercising independently       Understanding of Exercise Prescription Yes       Intervention Provide education, explanation, and written materials on patient's individual exercise prescription       Expected Outcomes Short Term: Able to explain program exercise prescription;Long Term: Able to explain home exercise prescription to exercise independently                Exercise Goals Re-Evaluation :   Discharge Exercise Prescription (Final Exercise Prescription Changes):   Nutrition:  Target Goals: Understanding of nutrition guidelines,  daily intake of sodium 1500mg , cholesterol 200mg , calories 30% from fat and 7% or less from saturated fats, daily to have 5 or more servings of fruits and vegetables.  Biometrics:  Pre Biometrics - 11/18/23 1015       Pre Biometrics   Waist Circumference 44.5 inches    Hip Circumference 46.25 inches    Waist to Hip Ratio 0.96 %    Triceps Skinfold 45 mm    % Body Fat 49.5 %    Grip Strength 13 kg    Flexibility --   No performed due to back issues   Single Leg Stand 1.2 seconds              Nutrition Therapy Plan and Nutrition Goals:   Nutrition Assessments:  MEDIFICTS Score  Key: >=70 Need to make dietary changes  40-70 Heart Healthy Diet <= 40 Therapeutic Level Cholesterol Diet    Picture Your Plate Scores: <09 Unhealthy dietary pattern with much room for improvement. 41-50 Dietary pattern unlikely to meet recommendations for good health and room for improvement. 51-60 More healthful dietary pattern, with some room for improvement.  >60 Healthy dietary pattern, although there may be some specific behaviors that could be improved.    Nutrition Goals Re-Evaluation:   Nutrition Goals Re-Evaluation:   Nutrition Goals Discharge (Final Nutrition Goals Re-Evaluation):   Psychosocial: Target Goals: Acknowledge presence or absence of significant depression and/or stress, maximize coping skills, provide positive support system. Participant is able to verbalize types and ability to use techniques and skills needed for reducing stress and depression.  Initial Review & Psychosocial Screening:  Initial Psych Review & Screening - 11/11/23 1150       Initial Review   Current issues with None Identified      Family Dynamics   Good Support System? Yes   Bular has her two sons, sister, sister in law and nieces for support     Barriers   Psychosocial barriers to participate in program There are no identifiable barriers or psychosocial needs.      Screening Interventions   Interventions Encouraged to exercise             Quality of Life Scores:  Quality of Life - 11/18/23 1205       Quality of Life   Select Quality of Life      Quality of Life Scores   Health/Function Pre 25.47 %    Socioeconomic Pre 25.79 %    Psych/Spiritual Pre 28.29 %    Family Pre 26.3 %    GLOBAL Pre 26.24 %            Scores of 19 and below usually indicate a poorer quality of life in these areas.  A difference of  2-3 points is a clinically meaningful difference.  A difference of 2-3 points in the total score of the Quality of Life Index has been associated with  significant improvement in overall quality of life, self-image, physical symptoms, and general health in studies assessing change in quality of life.  PHQ-9: Review Flowsheet  More data exists      11/11/2023 09/08/2023 02/05/2023 10/07/2022 12/15/2021  Depression screen PHQ 2/9  Decreased Interest 0 0 0 0 0  Down, Depressed, Hopeless 0 0 0 0 0  PHQ - 2 Score 0 0 0 0 0  Altered sleeping 0 0 0 - -  Tired, decreased energy 0 3 3 - -  Change in appetite 0 0 0 - -  Feeling bad or failure about yourself  0 0 0 - -  Trouble concentrating 0 0 0 - -  Moving slowly or fidgety/restless 0 0 0 - -  Suicidal thoughts 0 0 0 - -  PHQ-9 Score 0 3 3 - -  Difficult doing work/chores Not difficult at all Somewhat difficult Somewhat difficult - -   Interpretation of Total Score  Total Score Depression Severity:  1-4 = Minimal depression, 5-9 = Mild depression, 10-14 = Moderate depression, 15-19 = Moderately severe depression, 20-27 = Severe depression   Psychosocial Evaluation and Intervention:   Psychosocial Re-Evaluation:   Psychosocial Discharge (Final Psychosocial Re-Evaluation):   Vocational Rehabilitation: Provide vocational rehab assistance to qualifying candidates.   Vocational Rehab Evaluation & Intervention:  Vocational Rehab - 11/11/23 1150       Initial Vocational Rehab Evaluation & Intervention   Assessment shows need for Vocational Rehabilitation No   Elener is retired and does not need vocational rehab at this time.            Education: Education Goals: Education classes will be provided on a weekly basis, covering required topics. Participant will state understanding/return demonstration of topics presented.     Core Videos: Exercise    Move It!  Clinical staff conducted group or individual video education with verbal and written material and guidebook.  Patient learns the recommended Pritikin exercise program. Exercise with the goal of living a long, healthy  life. Some of the health benefits of exercise include controlled diabetes, healthier blood pressure levels, improved cholesterol levels, improved heart and lung capacity, improved sleep, and better body composition. Everyone should speak with their doctor before starting or changing an exercise routine.  Biomechanical Limitations Clinical staff conducted group or individual video education with verbal and written material and guidebook.  Patient learns how biomechanical limitations can impact exercise and how we can mitigate and possibly overcome limitations to have an impactful and balanced exercise routine.  Body Composition Clinical staff conducted group or individual video education with verbal and written material and guidebook.  Patient learns that body composition (ratio of muscle mass to fat mass) is a key component to assessing overall fitness, rather than body weight alone. Increased fat mass, especially visceral belly fat, can put [place] at increased risk for metabolic syndrome, type 2 diabetes, heart disease, and even death. It is recommended to combine diet and exercise (cardiovascular and resistance training) to improve your body composition. Seek guidance from your physician and exercise physiologist before implementing an exercise routine.  Exercise Action Plan Clinical staff conducted group or individual video education with verbal and written material and guidebook.  Patient learns the recommended strategies to achieve and enjoy long-term exercise adherence, including variety, self-motivation, self-efficacy, and positive decision making. Benefits of exercise include fitness, good health, weight management, more energy, better sleep, less stress, and overall well-being.  Medical   Heart Disease Risk Reduction Clinical staff conducted group or individual video education with verbal and written material and guidebook.  Patient learns our heart is our most vital organ as it circulates  oxygen, nutrients, white blood cells, and hormones throughout the entire body, and carries waste away. Data supports a plant-based eating plan like the Pritikin Program for its effectiveness in slowing progression of and reversing heart disease. The video provides a number of recommendations to address heart disease.   Metabolic Syndrome and Belly Fat  Clinical staff conducted group or individual video education with verbal and written material and guidebook.  Patient  learns what metabolic syndrome is, how it leads to heart disease, and how one can reverse it and keep it from coming back. You have metabolic syndrome if you have 3 of the following 5 criteria: abdominal obesity, high blood pressure, high triglycerides, low HDL cholesterol, and high blood sugar.  Hypertension and Heart Disease Clinical staff conducted group or individual video education with verbal and written material and guidebook.  Patient learns that high blood pressure, or hypertension, is very common in the [place]. Hypertension is largely due to excessive salt intake, but other important risk factors include being overweight, physical inactivity, drinking too much alcohol, smoking, and not eating enough potassium from fruits and vegetables. High blood pressure is a leading risk factor for heart attack, stroke, congestive heart failure, dementia, kidney failure, and premature death. Long-term effects of excessive salt intake include stiffening of the arteries and thickening of heart muscle and organ damage. Recommendations include ways to reduce hypertension and the risk of heart disease.  Diseases of Our Time - Focusing on Diabetes Clinical staff conducted group or individual video education with verbal and written material and guidebook.  Patient learns why the best way to stop diseases of our time is prevention, through food and other lifestyle changes. Medicine (such as prescription pills and surgeries) is often only a  Band-Aid on the problem, not a long-term solution. Most common diseases of our time include obesity, type 2 diabetes, hypertension, heart disease, and cancer. The Pritikin Program is recommended and has been proven to help reduce, reverse, and/or prevent the damaging effects of metabolic syndrome.  Nutrition   Overview of the Pritikin Eating Plan  Clinical staff conducted group or individual video education with verbal and written material and guidebook.  Patient learns about the Pritikin Eating Plan for disease risk reduction. The Pritikin Eating Plan emphasizes a wide variety of unrefined, minimally-processed carbohydrates, like fruits, vegetables, whole grains, and legumes. Go, Caution, and Stop food choices are explained. Plant-based and lean animal proteins are emphasized. Rationale provided for low sodium intake for blood pressure control, low added sugars for blood sugar stabilization, and low added fats and oils for coronary artery disease risk reduction and weight management.  Calorie Density  Clinical staff conducted group or individual video education with verbal and written material and guidebook.  Patient learns about calorie density and how it impacts the Pritikin Eating Plan. Knowing the characteristics of the food you choose will help you decide whether those foods will lead to weight gain or weight loss, and whether you want to consume more or less of them. Weight loss is usually a side effect of the Pritikin Eating Plan because of its focus on low calorie-dense foods.  Label Reading  Clinical staff conducted group or individual video education with verbal and written material and guidebook.  Patient learns about the Pritikin recommended label reading guidelines and corresponding recommendations regarding calorie density, added sugars, sodium content, and whole grains.  Dining Out - Part 1  Clinical staff conducted group or individual video education with verbal and written material  and guidebook.  Patient learns that restaurant meals can be sabotaging because they can be so high in calories, fat, sodium, and/or sugar. Patient learns recommended strategies on how to positively address this and avoid unhealthy pitfalls.  Facts on Fats  Clinical staff conducted group or individual video education with verbal and written material and guidebook.  Patient learns that lifestyle modifications can be just as effective, if not more so, as many  medications for lowering your risk of heart disease. A Pritikin lifestyle can help to reduce your risk of inflammation and atherosclerosis (cholesterol build-up, or plaque, in the artery walls). Lifestyle interventions such as dietary choices and physical activity address the cause of atherosclerosis. A review of the types of fats and their impact on blood cholesterol levels, along with dietary recommendations to reduce fat intake is also included.  Nutrition Action Plan  Clinical staff conducted group or individual video education with verbal and written material and guidebook.  Patient learns how to incorporate Pritikin recommendations into their lifestyle. Recommendations include planning and keeping personal health goals in mind as an important part of their success.  Healthy Mind-Set    Healthy Minds, Bodies, Hearts  Clinical staff conducted group or individual video education with verbal and written material and guidebook.  Patient learns how to identify when they are stressed. Video will discuss the impact of that stress, as well as the many benefits of stress management. Patient will also be introduced to stress management techniques. The way we think, act, and feel has an impact on our hearts.  How Our Thoughts Can Heal Our Hearts  Clinical staff conducted group or individual video education with verbal and written material and guidebook.  Patient learns that negative thoughts can cause depression and anxiety. This can result in negative  lifestyle behavior and serious health problems. Cognitive behavioral therapy is an effective method to help control our thoughts in order to change and improve our emotional outlook.  Additional Videos:  Exercise    Improving Performance  Clinical staff conducted group or individual video education with verbal and written material and guidebook.  Patient learns to use a non-linear approach by alternating intensity levels and lengths of time spent exercising to help burn more calories and lose more body fat. Cardiovascular exercise helps improve heart health, metabolism, hormonal balance, blood sugar control, and recovery from fatigue. Resistance training improves strength, endurance, balance, coordination, reaction time, metabolism, and muscle mass. Flexibility exercise improves circulation, posture, and balance. Seek guidance from your physician and exercise physiologist before implementing an exercise routine and learn your capabilities and proper form for all exercise.  Introduction to Yoga  Clinical staff conducted group or individual video education with verbal and written material and guidebook.  Patient learns about yoga, a discipline of the coming together of mind, breath, and body. The benefits of yoga include improved flexibility, improved range of motion, better posture and core strength, increased lung function, weight loss, and positive self-image. Yoga's heart health benefits include lowered blood pressure, healthier heart rate, decreased cholesterol and triglyceride levels, improved immune function, and reduced stress. Seek guidance from your physician and exercise physiologist before implementing an exercise routine and learn your capabilities and proper form for all exercise.  Medical   Aging: Enhancing Your Quality of Life  Clinical staff conducted group or individual video education with verbal and written material and guidebook.  Patient learns key strategies and recommendations  to stay in good physical health and enhance quality of life, such as prevention strategies, having an advocate, securing a Health Care Proxy and Power of Attorney, and keeping a list of medications and system for tracking them. It also discusses how to avoid risk for bone loss.  Biology of Weight Control  Clinical staff conducted group or individual video education with verbal and written material and guidebook.  Patient learns that weight gain occurs because we consume more calories than we burn (eating more, moving less). Even  if your body weight is normal, you may have higher ratios of fat compared to muscle mass. Too much body fat puts you at increased risk for cardiovascular disease, heart attack, stroke, type 2 diabetes, and obesity-related cancers. In addition to exercise, following the Pritikin Eating Plan can help reduce your risk.  Decoding Lab Results  Clinical staff conducted group or individual video education with verbal and written material and guidebook.  Patient learns that lab test reflects one measurement whose values change over time and are influenced by many factors, including medication, stress, sleep, exercise, food, hydration, pre-existing medical conditions, and more. It is recommended to use the knowledge from this video to become more involved with your lab results and evaluate your numbers to speak with your doctor.   Diseases of Our Time - Overview  Clinical staff conducted group or individual video education with verbal and written material and guidebook.  Patient learns that according to the CDC, 50% to 70% of chronic diseases (such as obesity, type 2 diabetes, elevated lipids, hypertension, and heart disease) are avoidable through lifestyle improvements including healthier food choices, listening to satiety cues, and increased physical activity.  Sleep Disorders Clinical staff conducted group or individual video education with verbal and written material and  guidebook.  Patient learns how good quality and duration of sleep are important to overall health and well-being. Patient also learns about sleep disorders and how they impact health along with recommendations to address them, including discussing with a physician.  Nutrition  Dining Out - Part 2 Clinical staff conducted group or individual video education with verbal and written material and guidebook.  Patient learns how to plan ahead and communicate in order to maximize their dining experience in a healthy and nutritious manner. Included are recommended food choices based on the type of restaurant the patient is visiting.   Fueling a Banker conducted group or individual video education with verbal and written material and guidebook.  There is a strong connection between our food choices and our health. Diseases like obesity and type 2 diabetes are very prevalent and are in large-part due to lifestyle choices. The Pritikin Eating Plan provides plenty of food and hunger-curbing satisfaction. It is easy to follow, affordable, and helps reduce health risks.  Menu Workshop  Clinical staff conducted group or individual video education with verbal and written material and guidebook.  Patient learns that restaurant meals can sabotage health goals because they are often packed with calories, fat, sodium, and sugar. Recommendations include strategies to plan ahead and to communicate with the manager, chef, or server to help order a healthier meal.  Planning Your Eating Strategy  Clinical staff conducted group or individual video education with verbal and written material and guidebook.  Patient learns about the Pritikin Eating Plan and its benefit of reducing the risk of disease. The Pritikin Eating Plan does not focus on calories. Instead, it emphasizes high-quality, nutrient-rich foods. By knowing the characteristics of the foods, we choose, we can determine their calorie density  and make informed decisions.  Targeting Your Nutrition Priorities  Clinical staff conducted group or individual video education with verbal and written material and guidebook.  Patient learns that lifestyle habits have a tremendous impact on disease risk and progression. This video provides eating and physical activity recommendations based on your personal health goals, such as reducing LDL cholesterol, losing weight, preventing or controlling type 2 diabetes, and reducing high blood pressure.  Vitamins and Minerals  Clinical staff  conducted group or individual video education with verbal and written material and guidebook.  Patient learns different ways to obtain key vitamins and minerals, including through a recommended healthy diet. It is important to discuss all supplements you take with your doctor.   Healthy Mind-Set    Smoking Cessation  Clinical staff conducted group or individual video education with verbal and written material and guidebook.  Patient learns that cigarette smoking and tobacco addiction pose a serious health risk which affects millions of people. Stopping smoking will significantly reduce the risk of heart disease, lung disease, and many forms of cancer. Recommended strategies for quitting are covered, including working with your doctor to develop a successful plan.  Culinary   Becoming a Set designer conducted group or individual video education with verbal and written material and guidebook.  Patient learns that cooking at home can be healthy, cost-effective, quick, and puts them in control. Keys to cooking healthy recipes will include looking at your recipe, assessing your equipment needs, planning ahead, making it simple, choosing cost-effective seasonal ingredients, and limiting the use of added fats, salts, and sugars.  Cooking - Breakfast and Snacks  Clinical staff conducted group or individual video education with verbal and written material  and guidebook.  Patient learns how important breakfast is to satiety and nutrition through the entire day. Recommendations include key foods to eat during breakfast to help stabilize blood sugar levels and to prevent overeating at meals later in the day. Planning ahead is also a key component.  Cooking - Educational psychologist conducted group or individual video education with verbal and written material and guidebook.  Patient learns eating strategies to improve overall health, including an approach to cook more at home. Recommendations include thinking of animal protein as a side on your plate rather than center stage and focusing instead on lower calorie dense options like vegetables, fruits, whole grains, and plant-based proteins, such as beans. Making sauces in large quantities to freeze for later and leaving the skin on your vegetables are also recommended to maximize your experience.  Cooking - Healthy Salads and Dressing Clinical staff conducted group or individual video education with verbal and written material and guidebook.  Patient learns that vegetables, fruits, whole grains, and legumes are the foundations of the Pritikin Eating Plan. Recommendations include how to incorporate each of these in flavorful and healthy salads, and how to create homemade salad dressings. Proper handling of ingredients is also covered. Cooking - Soups and [employer] - Soups and Desserts Clinical staff conducted group or individual video education with verbal and written material and guidebook.  Patient learns that Pritikin soups and desserts make for easy, nutritious, and delicious snacks and meal components that are low in sodium, fat, sugar, and calorie density, while high in vitamins, minerals, and filling fiber. Recommendations include simple and healthy ideas for soups and desserts.   Overview     The Pritikin Solution Program Overview Clinical staff conducted group or individual video  education with verbal and written material and guidebook.  Patient learns that the results of the Pritikin Program have been documented in more than 100 articles published in peer-reviewed journals, and the benefits include reducing risk factors for (and, in some cases, even reversing) high cholesterol, high blood pressure, type 2 diabetes, obesity, and more! An overview of the three key pillars of the Pritikin Program will be covered: eating well, doing regular exercise, and having a healthy mind-set.  WORKSHOPS  Exercise: Exercise Basics: Building Your Action Plan Clinical staff led group instruction and group discussion with PowerPoint presentation and patient guidebook. To enhance the learning environment the use of posters, models and videos may be added. At the conclusion of this workshop, patients will comprehend the difference between physical activity and exercise, as well as the benefits of incorporating both, into their routine. Patients will understand the FITT (Frequency, Intensity, Time, and Type) principle and how to use it to build an exercise action plan. In addition, safety concerns and other considerations for exercise and cardiac rehab will be addressed by the presenter. The purpose of this lesson is to promote a comprehensive and effective weekly exercise routine in order to improve patients' overall level of fitness.   Managing Heart Disease: Your Path to a Healthier Heart Clinical staff led group instruction and group discussion with PowerPoint presentation and patient guidebook. To enhance the learning environment the use of posters, models and videos may be added.At the conclusion of this workshop, patients will understand the anatomy and physiology of the heart. Additionally, they will understand how Pritikin's three pillars impact the risk factors, the progression, and the management of heart disease.  The purpose of this lesson is to provide a high-level overview of the  heart, heart disease, and how the Pritikin lifestyle positively impacts risk factors.  Exercise Biomechanics Clinical staff led group instruction and group discussion with PowerPoint presentation and patient guidebook. To enhance the learning environment the use of posters, models and videos may be added. Patients will learn how the structural parts of their bodies function and how these functions impact their daily activities, movement, and exercise. Patients will learn how to promote a neutral spine, learn how to manage pain, and identify ways to improve their physical movement in order to promote healthy living. The purpose of this lesson is to expose patients to common physical limitations that impact physical activity. Participants will learn practical ways to adapt and manage aches and pains, and to minimize their effect on regular exercise. Patients will learn how to maintain good posture while sitting, walking, and lifting.  Balance Training and Fall Prevention  Clinical staff led group instruction and group discussion with PowerPoint presentation and patient guidebook. To enhance the learning environment the use of posters, models and videos may be added. At the conclusion of this workshop, patients will understand the importance of their sensorimotor skills (vision, proprioception, and the vestibular system) in maintaining their ability to balance as they age. Patients will apply a variety of balancing exercises that are appropriate for their current level of function. Patients will understand the common causes for poor balance, possible solutions to these problems, and ways to modify their physical environment in order to minimize their fall risk. The purpose of this lesson is to teach patients about the importance of maintaining balance as they age and ways to minimize their risk of falling.  WORKSHOPS   Nutrition:  Fueling a Ship broker led group instruction and  group discussion with PowerPoint presentation and patient guidebook. To enhance the learning environment the use of posters, models and videos may be added. Patients will review the foundational principles of the Pritikin Eating Plan and understand what constitutes a serving size in each of the food groups. Patients will also learn Pritikin-friendly foods that are better choices when away from home and review make-ahead meal and snack options. Calorie density will be reviewed and applied to three nutrition priorities: weight maintenance, weight loss, and weight  gain. The purpose of this lesson is to reinforce (in a group setting) the key concepts around what patients are recommended to eat and how to apply these guidelines when away from home by planning and selecting Pritikin-friendly options. Patients will understand how calorie density may be adjusted for different weight management goals.  Mindful Eating  Clinical staff led group instruction and group discussion with PowerPoint presentation and patient guidebook. To enhance the learning environment the use of posters, models and videos may be added. Patients will briefly review the concepts of the Pritikin Eating Plan and the importance of low-calorie dense foods. The concept of mindful eating will be introduced as well as the importance of paying attention to internal hunger signals. Triggers for non-hunger eating and techniques for dealing with triggers will be explored. The purpose of this lesson is to provide patients with the opportunity to review the basic principles of the Pritikin Eating Plan, discuss the value of eating mindfully and how to measure internal cues of hunger and fullness using the Hunger Scale. Patients will also discuss reasons for non-hunger eating and learn strategies to use for controlling emotional eating.  Targeting Your Nutrition Priorities Clinical staff led group instruction and group discussion with PowerPoint presentation  and patient guidebook. To enhance the learning environment the use of posters, models and videos may be added. Patients will learn how to determine their genetic susceptibility to disease by reviewing their family history. Patients will gain insight into the importance of diet as part of an overall healthy lifestyle in mitigating the impact of genetics and other environmental insults. The purpose of this lesson is to provide patients with the opportunity to assess their personal nutrition priorities by looking at their family history, their own health history and current risk factors. Patients will also be able to discuss ways of prioritizing and modifying the Pritikin Eating Plan for their highest risk areas  Menu  Clinical staff led group instruction and group discussion with PowerPoint presentation and patient guidebook. To enhance the learning environment the use of posters, models and videos may be added. Using menus brought in from [employer], or printed from [employer], patients will apply the Pritikin dining out guidelines that were presented in the [employer] video. Patients will also be able to practice these guidelines in a variety of provided scenarios. The purpose of this lesson is to provide patients with the opportunity to practice hands-on learning of the Pritikin Dining Out guidelines with actual menus and practice scenarios.  Label Reading Clinical staff led group instruction and group discussion with PowerPoint presentation and patient guidebook. To enhance the learning environment the use of posters, models and videos may be added. Patients will review and discuss the Pritikin label reading guidelines presented in Pritikin's Label Reading Educational series video. Using fool labels brought in from local grocery stores and markets, patients will apply the label reading guidelines and determine if the packaged food meet the Pritikin guidelines. The purpose of  this lesson is to provide patients with the opportunity to review, discuss, and practice hands-on learning of the Pritikin Label Reading guidelines with actual packaged food labels. Cooking School  Pritikin's [employer] are designed to teach patients ways to prepare quick, simple, and affordable recipes at home. The importance of nutrition's role in chronic disease risk reduction is reflected in its emphasis in the overall Pritikin program. By learning how to prepare essential core Pritikin Eating Plan recipes, patients will increase control over what they  eat; be able to customize the flavor of foods without the use of added salt, sugar, or fat; and improve the quality of the food they consume. By learning a set of core recipes which are easily assembled, quickly prepared, and affordable, patients are more likely to prepare more healthy foods at home. These workshops focus on convenient breakfasts, simple entres, side dishes, and desserts which can be prepared with minimal effort and are consistent with nutrition recommendations for cardiovascular risk reduction. Cooking [employer] are taught by a Armed forces logistics/support/administrative officer (RD) who has been trained by the [employer]. The chef or RD has a clear understanding of the importance of minimizing - if not completely eliminating - added fat, sugar, and sodium in recipes. Throughout the series of Cooking School Workshop sessions, patients will learn about healthy ingredients and efficient methods of cooking to build confidence in their capability to prepare    Cooking School weekly topics:  Adding Flavor- Sodium-Free  Fast and Healthy Breakfasts  Powerhouse Plant-Based Proteins  Satisfying Salads and Dressings  Simple Sides and Sauces  International Cuisine-Spotlight on the [employer] Zones  Delicious Desserts  Savory Soups  [employer] - Meals in a Astronomer Appetizers and Snacks  Comforting Weekend  Breakfasts  One-Pot Wonders   Fast Evening Meals  Landscape architect Your Pritikin Plate  WORKSHOPS   Healthy Mindset (Psychosocial):  Focused Goals, Sustainable Changes Clinical staff led group instruction and group discussion with PowerPoint presentation and patient guidebook. To enhance the learning environment the use of posters, models and videos may be added. Patients will be able to apply effective goal setting strategies to establish at least one personal goal, and then take consistent, meaningful action toward that goal. They will learn to identify common barriers to achieving personal goals and develop strategies to overcome them. Patients will also gain an understanding of how our mind-set can impact our ability to achieve goals and the importance of cultivating a positive and growth-oriented mind-set. The purpose of this lesson is to provide patients with a deeper understanding of how to set and achieve personal goals, as well as the tools and strategies needed to overcome common obstacles which may arise along the way.  From Head to Heart: The Power of a Healthy Outlook  Clinical staff led group instruction and group discussion with PowerPoint presentation and patient guidebook. To enhance the learning environment the use of posters, models and videos may be added. Patients will be able to recognize and describe the impact of emotions and mood on physical health. They will discover the importance of self-care and explore self-care practices which may work for them. Patients will also learn how to utilize the 4 C's to cultivate a healthier outlook and better manage stress and challenges. The purpose of this lesson is to demonstrate to patients how a healthy outlook is an essential part of maintaining good health, especially as they continue their cardiac rehab journey.  Healthy Sleep for a Healthy Heart Clinical staff led group instruction and group discussion with  PowerPoint presentation and patient guidebook. To enhance the learning environment the use of posters, models and videos may be added. At the conclusion of this workshop, patients will be able to demonstrate knowledge of the importance of sleep to overall health, well-being, and quality of life. They will understand the symptoms of, and treatments for, common sleep disorders. Patients will also be able to identify daytime and nighttime behaviors which impact sleep, and they  will be able to apply these tools to help manage sleep-related challenges. The purpose of this lesson is to provide patients with a general overview of sleep and outline the importance of quality sleep. Patients will learn about a few of the most common sleep disorders. Patients will also be introduced to the concept of "sleep hygiene," and discover ways to self-manage certain sleeping problems through simple daily behavior changes. Finally, the workshop will motivate patients by clarifying the links between quality sleep and their goals of heart-healthy living.   Recognizing and Reducing Stress Clinical staff led group instruction and group discussion with PowerPoint presentation and patient guidebook. To enhance the learning environment the use of posters, models and videos may be added. At the conclusion of this workshop, patients will be able to understand the types of stress reactions, differentiate between acute and chronic stress, and recognize the impact that chronic stress has on their health. They will also be able to apply different coping mechanisms, such as reframing negative self-talk. Patients will have the opportunity to practice a variety of stress management techniques, such as deep abdominal breathing, progressive muscle relaxation, and/or guided imagery.  The purpose of this lesson is to educate patients on the role of stress in their lives and to provide healthy techniques for coping with it.  Learning  Barriers/Preferences:  Learning Barriers/Preferences - 11/18/23 1022       Learning Barriers/Preferences   Learning Barriers Sight   wears glasses, HOH wears bilateral hearing aids   Learning Preferences Audio;Verbal Instruction;Computer/Internet;Video;Group Instruction;Written Material;Individual Instruction;Pictoral;Skilled Demonstration             Education Topics:  Knowledge Questionnaire Score:  Knowledge Questionnaire Score - 11/18/23 1206       Knowledge Questionnaire Score   Pre Score 22/24             Core Components/Risk Factors/Patient Goals at Admission:  Personal Goals and Risk Factors at Admission - 11/18/23 1031       Core Components/Risk Factors/Patient Goals on Admission   Diabetes Yes    Intervention Provide education about signs/symptoms and action to take for hypo/hyperglycemia.;Provide education about proper nutrition, including hydration, and aerobic/resistive exercise prescription along with prescribed medications to achieve blood glucose in normal ranges: Fasting glucose 65-99 mg/dL    Expected Outcomes Short Term: Participant verbalizes understanding of the signs/symptoms and immediate care of hyper/hypoglycemia, proper foot care and importance of medication, aerobic/resistive exercise and nutrition plan for blood glucose control.;Long Term: Attainment of HbA1C < 7%.    Hypertension Yes    Intervention Provide education on lifestyle modifcations including regular physical activity/exercise, weight management, moderate sodium restriction and increased consumption of fresh fruit, vegetables, and low fat dairy, alcohol moderation, and smoking cessation.;Monitor prescription use compliance.    Expected Outcomes Short Term: Continued assessment and intervention until BP is < 140/74mm HG in hypertensive participants. < 130/26mm HG in hypertensive participants with diabetes, heart failure or chronic kidney disease.;Long Term: Maintenance of blood pressure at  goal levels.    Lipids Yes    Intervention Provide education and support for participant on nutrition & aerobic/resistive exercise along with prescribed medications to achieve LDL 70mg , HDL >40mg .    Expected Outcomes Short Term: Participant states understanding of desired cholesterol values and is compliant with medications prescribed. Participant is following exercise prescription and nutrition guidelines.;Long Term: Cholesterol controlled with medications as prescribed, with individualized exercise RX and with personalized nutrition plan. Value goals: LDL < 70mg , HDL > 40 mg.  Core Components/Risk Factors/Patient Goals Review:    Core Components/Risk Factors/Patient Goals at Discharge (Final Review):    ITP Comments:  ITP Comments     Row Name 11/11/23 0923           ITP Comments Dr. Armanda Magic medical director. Introduction to pritikin education/ intensive cardiac rehab. Initial orientation packet reviewed with patient.                Comments: Participant attended orientation for the cardiac rehabilitation program on  11/18/2023  to perform initial intake and exercise walk test. Patient introduced to the Pritikin Program education and orientation packet was reviewed. Completed 6-minute walk test, measurements, initial ITP, and exercise prescription. Vital signs stable. Telemetry-normal sinus rhythm, 1 degree AVB, asymptomatic.   Service time was from 0954 to 1133.

## 2023-11-19 ENCOUNTER — Encounter (HOSPITAL_COMMUNITY): Payer: Medicare HMO

## 2023-11-22 ENCOUNTER — Encounter (HOSPITAL_COMMUNITY)
Admission: RE | Admit: 2023-11-22 | Discharge: 2023-11-22 | Disposition: A | Discharge status: Home / Self Care | Payer: Medicare HMO | Source: Ambulatory Visit | Attending: Cardiovascular Disease | Admitting: Cardiovascular Disease

## 2023-11-22 DIAGNOSIS — Z952 Presence of prosthetic heart valve: Secondary | ICD-10-CM

## 2023-11-22 DIAGNOSIS — I4891 Unspecified atrial fibrillation: Secondary | ICD-10-CM | POA: Diagnosis not present

## 2023-11-22 LAB — GLUCOSE, CAPILLARY
Glucose-Capillary: 144 mg/dL — ABNORMAL HIGH (ref 70–99)
Glucose-Capillary: 135 mg/dL — ABNORMAL HIGH (ref 70–99)

## 2023-11-22 NOTE — Progress Notes (Signed)
Daily Session Note  Patient Details  Name: Bonnie Chavez MRN: [id number] Date of Birth: [birth year] Referring Provider:   Flowsheet Row INTENSIVE CARDIAC REHAB from 11/18/2023 in [facility]  Referring Provider Nicki Guadalajara, MD       Encounter Date: 11/22/2023  Check In:  Session Check In - 11/22/23 1303       Check-In   Supervising physician immediately available to respond to emergencies [facility] - Physician supervision    Physician(s) Jari Favre, PA    Location [facility]    Staff Present Lorin Picket, MS, ACSM-CEP, CCRP, Exercise Physiologist;Shaquela Weichert, RN, BSN;Johnny Hale Bogus, MS, Exercise Physiologist;Bailey Wallace Cullens, MS, Exercise Physiologist    Virtual Visit No    Medication changes reported     No    Fall or balance concerns reported    No    Tobacco Cessation No Change    Warm-up and Cool-down Performed as group-led instruction   CRP2 orientation today   Resistance Training Performed Yes    VAD Patient? No    PAD/SET Patient? No      Pain Assessment   Currently in Pain? No/denies    Pain Score 0-No pain    Multiple Pain Sites No             Capillary Blood Glucose: Results for orders placed or performed during the hospital encounter of 11/18/23 (from the past 24 hours)  Glucose, capillary     Status: Abnormal   Collection Time: 11/22/23 12:38 PM  Result Value Ref Range   Glucose-Capillary 144 (H) 70 - 99 mg/dL  Glucose, capillary     Status: Abnormal   Collection Time: 11/22/23  1:40 PM  Result Value Ref Range   Glucose-Capillary 135 (H) 70 - 99 mg/dL     Exercise Prescription Changes - 11/22/23 1600       Response to Exercise   Blood Pressure (Admit) 142/64    Blood Pressure (Exercise) 152/60    Blood Pressure (Exit) 138/72    Heart Rate (Admit) 68 bpm    Heart Rate (Exercise) 95 bpm    Heart Rate (Exit) 77 bpm    Rating of Perceived Exertion (Exercise) 11    Symptoms  None    Comments Pt's first day in the CRP2 program    Duration Progress to 30 minutes of  aerobic without signs/symptoms of physical distress    Intensity THRR unchanged      Progression   Progression Continue to progress workloads to maintain intensity without signs/symptoms of physical distress.    Average METs 1.9      Resistance Training   Training Prescription Yes    Weight 2 lbs    Reps 10-15    Time 10 Minutes      Interval Training   Interval Training No      NuStep   Level 1    SPM 75    Minutes 25    METs 1.9             Social History   Tobacco Use  Smoking Status Never  Smokeless Tobacco Never    Goals Met:  Exercise tolerated well No report of concerns or symptoms today Strength training completed today  Goals Unmet:  Not Applicable  Comments: Pt started cardiac rehab today.  Pt tolerated light exercise without difficulty. VSS, telemetry-Sinus Rhythm with PAC's, asymptomatic.  Medication list reconciled. Pt denies barriers to medicaiton compliance.  PSYCHOSOCIAL  ASSESSMENT:  PHQ-0. Pt exhibits positive coping skills, hopeful outlook with supportive family. No psychosocial needs identified at this time, no psychosocial interventions necessary.    Pt enjoys family, word searches, cleaning and watching TV.   Pt oriented to exercise equipment and routine.    Understanding verbalized.    Dr. Armanda Magic is Medical Director for Cardiac Rehab at [facility].

## 2023-11-25 ENCOUNTER — Ambulatory Visit: Payer: Medicare HMO | Admitting: Family Medicine

## 2023-11-25 VITALS — BP 125/68 | HR 60 | Wt 186.0 lb

## 2023-11-25 DIAGNOSIS — Z0000 Encounter for general adult medical examination without abnormal findings: Secondary | ICD-10-CM

## 2023-11-25 NOTE — Patient Instructions (Signed)
 I really enjoyed getting to talk with you today! I am available on Tuesdays and Thursdays for virtual visits if you have any questions or concerns, or if I can be of any further assistance.   CHECKLIST FROM ANNUAL WELLNESS VISIT:  -Follow up (please call to schedule if not scheduled after visit):   -yearly for annual wellness visit with primary care office  Here is a list of your preventive care/health maintenance measures and the plan for each if any are due:  PLAN For any measures below that may be due:   Health Maintenance  Topic Date Due   DTaP/Tdap/Td (1 - Tdap) Never done   OPHTHALMOLOGY EXAM  01/31/2023   HEMOGLOBIN A1C  07/07/2023   COVID-19 Vaccine (6 - 2024-25 season) 12/11/2023 (Originally 07/25/2023)   FOOT EXAM  07/04/2024   Medicare Annual Wellness (AWV)  11/24/2024   Pneumonia Vaccine 86+ Years old  Completed   INFLUENZA VACCINE  Completed   DEXA SCAN  Completed   Zoster Vaccines- Shingrix  Completed   HPV VACCINES  Aged Out    -See a dentist at least yearly  -Get your eyes checked and then per your eye specialist's recommendations  -Other issues addressed today:   -I have included below further information regarding a healthy whole foods based diet, physical activity guidelines for adults, stress management and opportunities for social connections. I hope you find this information useful.   -----------------------------------------------------------------------------------------------------------------------------------------------------------------------------------------------------------------------------------------------------------  NUTRITION: -eat real food: lots of colorful vegetables (half the plate) and fruits -5-7 servings of vegetables and fruits per day (fresh or steamed is best), exp. 2 servings of vegetables with lunch and dinner and 2 servings of fruit per day. Berries and greens such as kale and collards are great choices.  -consume on a regular  basis: whole grains (make sure first ingredient on label contains the word whole), fresh fruits, fish, nuts, seeds, healthy oils (such as olive oil, avocado oil, grape seed oil) -may eat small amounts of dairy and lean meat on occasion, but avoid processed meats such as ham, bacon, lunch meat, etc. -drink water -try to avoid fast food and pre-packaged foods, processed meat -most experts advise limiting sodium to < 2300mg  per day, should limit further is any chronic conditions such as high blood pressure, heart disease, diabetes, etc. The American Heart Association advised that < 1500mg  is is ideal -try to avoid foods that contain any ingredients with names you do not recognize  -try to avoid sugar/sweets (except for the natural sugar that occurs in fresh fruit) -try to avoid sweet drinks -try to avoid white rice, white bread, pasta (unless whole grain), white or yellow potatoes  EXERCISE GUIDELINES FOR ADULTS: -if you wish to increase your physical activity, do so gradually and with the approval of your doctor -STOP and seek medical care immediately if you have any chest pain, chest discomfort or trouble breathing when starting or increasing exercise  -move and stretch your body, legs, feet and arms when sitting for long periods -Physical activity guidelines for optimal health in adults: -least 150 minutes per week of aerobic exercise (can talk, but not sing) once approved by your doctor, 20-30 minutes of sustained activity or two 10 minute episodes of sustained activity every day.  -resistance training at least 2 days per week if approved by your doctor -balance exercises 3+ days per week:   Stand somewhere where you have something sturdy to hold onto if you lose balance.    1) lift up on toes,  start with 5x per day and work up to 20x   2) stand and lift on leg straight out to the side so that foot is a few inches of the floor, start with 5x each side and work up to 20x each side   3) stand  on one foot, start with 5 seconds each side and work up to 20 seconds on each side  If you need ideas or help with getting more active:  -Silver  sneakers https://tools.silversneakers.com  -Walk with a Doc: Http://www.duncan-williams.com/  -try to include resistance (weight lifting/strength building) and balance exercises twice per week: or the following link for ideas: http://castillo-powell.com/  buyducts.dk  STRESS MANAGEMENT: -can try meditating, or just sitting quietly with deep breathing while intentionally relaxing all parts of your body for 5 minutes daily -if you need further help with stress, anxiety or depression please follow up with your primary doctor or contact the wonderful folks at [facility]: [phone]  SOCIAL CONNECTIONS: -options in [place] if you wish to engage in more social and exercise related activities:  -Silver  sneakers https://tools.silversneakers.com  -Walk with a Doc: Http://www.duncan-williams.com/  -Check out the [facility] Active Adults 50+ section on the [place] of [employer] (hiking clubs, book clubs, cards and games, chess, exercise classes, aquatic classes and much more) - see the website for details: https://www.[place].gov/departments/parks-recreation/active-adults50  -YouTube has lots of exercise videos for different ages and abilities as well  -[facility] (a variety of indoor and outdoor inperson activities for adults). (325)526-7793. [address]  -Virtual Online Classes (a variety of topics): see seniorplanet.org or call [phone]  -consider volunteering at a school, hospice center, church, senior center or elsewhere

## 2023-11-25 NOTE — Progress Notes (Signed)
 PATIENT CHECK-IN and HEALTH RISK ASSESSMENT QUESTIONNAIRE:  -completed by phone/video for upcoming Medicare Preventive Visit    Pre-Visit Check-in: 1)Vitals (height, wt, BP, etc) - record in vitals section for visit on day of visit Request home vitals (wt, BP, etc.) and enter into vitals, THEN update Vital Signs SmartPhrase below at the top of the HPI. See below.  2)Review and Update Medications, Allergies PMH, Surgeries, Social history in Epic 3)Hospitalizations in the last year with date/reason? 11/12: repair aortic valve  4)Review and Update Care Team (patient's specialists) in Epic 5) Complete PHQ9 in Epic  6) Complete Fall Screening in Epic 7)Review all Health Maintenance Due and order under PCP if not done.  Medicare Wellness Patient Questionnaire:  Answer theses question about your habits: How often do you have a drink containing alcohol?no How many drinks containing alcohol do you have on a typical day when you are drinking?n/a How often do you have six or more drinks on one occasion?n/a Have you ever smoked?no  Quit date if applicable? N/a   How many packs a day do/did you smoke? no Do you use smokeless tobacco?no Do you use an illicit drugs?no On average, how many days per week do you engage in moderate to strenuous exercise (like a brisk walk)?pause on water aerobic. Currently doing body exercise for rehab  On average, how many minutes do you engage in exercise at this level?3x a wk, 2 hr each Are you sexually active? No Number of partners?n/a Typical breakfast: oatmeal, sausages, eggs Typical lunch: today had chicken, rice, collard green, black eye peas Typical dinner:walnuts salad with out lettuce, post cereal Typical snacks:no snacks  Beverages: green tea, water, sometimes coffee in the morning.   Answer theses question about your everyday activities: Can you perform most household chores?some Are you deaf or have significant trouble hearing?yes, has hearing aid Do  you feel that you have a problem with memory?yes - feels is just typical - occasionally will forget what she was going to do/say - but then it come to her Do you feel safe at home?yes Last dentist visit?6 months ago 8. Do you have any difficulty performing your everyday activities?yes, has someone to help with cleaning Are you having any difficulty walking, taking medications on your own, and or difficulty managing daily home needs?yes with difficulty walking , has cane in the house to use if needed. Good with taking medication on own. Needs some help with daily home needs. Has a lady to come clean once a month.  Do you have difficulty walking or climbing stairs?yes Do you have difficulty dressing or bathing?no Do you have difficulty doing errands alone such as visiting a doctor's office or shopping?no, just takes a long time. Do you currently have any difficulty preparing food and eating?no Do you currently have any difficulty using the toilet?no Do you have any difficulty managing your finances?no Do you have any difficulties with housekeeping of managing your housekeeping?has a lady come in once a month to help with cleaning.    Do you have Advanced Directives in place (Living Will, Healthcare Power or Attorney)? yes   Last eye Exam and location?last March, 2024. Is due this March, 2025 - sees Dr. Robinson   Do you currently use prescribed or non-prescribed narcotic or opioid pain medications?no  Do you have a history or close family history of breast, ovarian, tubal or peritoneal cancer or a family member with BRCA (breast cancer susceptibility 1 and 2) gene mutations? no  Request home vitals (  wt, BP, etc.) and enter into vitals, THEN update Vital Signs SmartPhrase below at the top of the HPI. See below.   Nurse/Assistant Credentials/time stamp: Karpuih  M./CMA/5:10pm    ----------------------------------------------------------------------------------------------------------------------------------------------------------------------------------------------------------------------  Because this visit was a virtual/telehealth visit, some criteria may be missing or patient reported. Any vitals not documented were not able to be obtained and vitals that have been documented are patient reported.    MEDICARE ANNUAL PREVENTIVE VISIT WITH PROVIDER: (Welcome to Medicare, initial annual wellness or annual wellness exam)  Virtual Visit via Phone Note  I connected with Jahne Krukowski on 11/25/23 by phone and verified that I am speaking with the correct person using two identifiers.  Location patient: home Location provider:work or home office Persons participating in the virtual visit: patient, provider  Concerns and/or follow up today: doing well - recovering from valve surgery and breathing is better since the surgery. Uses cane sometimes - but does not feels like she needs it most of the time unless hip is acting up. Feels balance is ok. Doing cardiac rehab. Eating healthy.    See HM section in Epic for other details of completed HM.    ROS: negative for report of fevers, unintentional weight loss, vision changes, vision loss, hearing loss or change, chest pain, sob, hemoptysis, melena, hematochezia, hematuria, falls, bleeding or bruising, thoughts of suicide or self harm, memory loss  Patient-completed extensive health risk assessment - reviewed and discussed with the patient: See Health Risk Assessment completed with patient prior to the visit either above or in recent phone note. This was reviewed in detailed with the patient today and appropriate recommendations, orders and referrals were placed as needed per Summary below and patient instructions.   Review of Medical History: -PMH, PSH, Family History and current specialty and care  providers reviewed and updated and listed below   Patient Care Team: Mercer Clotilda SAUNDERS, MD as PCP - General (Family Medicine) Burnard Debby LABOR, MD as PCP - Cardiology (Cardiology) Tommas Pears, MD as Consulting Physician (Endocrinology) Deterding, Lynwood, MD as Consulting Physician (Nephrology) Raeanne, Shanda SQUIBB, MD (Inactive) as Consulting Physician (Obstetrics and Gynecology) Robinson Idol, MD as Consulting Physician (Ophthalmology) Melodi Lerner, MD as Consulting Physician (Orthopedic Surgery) Dolan Mateo Larger, MD as Consulting Physician (Nephrology) Henry Slough, MD as Consulting Physician (Obstetrics and Gynecology)   Past Medical History:  Diagnosis Date   (HFpEF) heart failure with preserved ejection fraction (HCC)    Anemia    History of , resolved   Carpal tunnel syndrome    Chronic low back pain 02/27/2016   -DDD -seeing [facility]    CKD stage 3b, GFR 30-44 ml/min (HCC)    Diabetes mellitus    type 2, sees Dr. Tommas   Goiter    Dr. Tommas   History of blood transfusion    auto transfusion post knee replacement   Hyperlipidemia    Hyperparathyroidism (HCC) 02/27/2016   -sees endocrinologist, Dr. Tommas, has seen surgeon as well    Hypertension    Migraine    no longer has migraines (80's) after menopause   Mitral valve prolapse    Neuropathy    Osteoarthritis    DDD, sees [facility]   PMB (postmenopausal bleeding) 02/28/2009   Resolved   S/P TAVR (transcatheter aortic valve replacement) 10/05/2023   s/p TAVR with a 23 mm Edwards S3UR via the TF approach by Dr. Wonda and [facility]   Severe aortic stenosis    Vaginal atrophy    Vitamin D  deficiency  Past Surgical History:  Procedure Laterality Date   CARPAL TUNNEL RELEASE Right 04/30/2023   Procedure: RIGHT CARPAL TUNNEL RELEASE;  Surgeon: Murrell Drivers, MD;  Location: [facility] OR;  Service: Orthopedics;  Laterality: Right;  30 MIN   CESAREAN SECTION  1972 &1976   CHOLECYSTECTOMY  1991    COLONOSCOPY     CORONARY ANGIOGRAPHY N/A 08/16/2023   Procedure: CORONARY ANGIOGRAPHY;  Surgeon: Wonda Sharper, MD;  Location: [facility] INVASIVE CV LAB;  Service: Cardiovascular;  Laterality: N/A;   DILATATION & CURRETTAGE/HYSTEROSCOPY WITH RESECTOCOPE N/A 05/15/2013   Procedure: DILATATION & CURETTAGE/HYSTEROSCOPY WITH RESECTOCOPE;  Surgeon: Shanda SHAUNNA Muscat, MD;  Location: [facility] ORS;  Service: Gynecology;  Laterality: N/A;   DILATATION & CURRETTAGE/HYSTEROSCOPY WITH RESECTOCOPE N/A 06/03/2017   Procedure: DILATATION & CURETTAGE/HYSTEROSCOPY;  Surgeon: Muscat Shanda SHAUNNA, MD;  Location: [facility] ORS;  Service: Gynecology;  Laterality: N/A;   DILATION AND CURETTAGE OF UTERUS  1975   HEMORRHOID SURGERY  1997   KNEE SURGERY  2000 2011   right knee replacement    REPLACEMENT TOTAL KNEE  2000   left   SHOULDER SURGERY  2004 2010    TRANSTHORACIC ECHOCARDIOGRAM  07/2010   EF=>55%; LA mildly dilated; trace MR/TR;   TUBAL LIGATION  1976    Social History   Socioeconomic History   Marital status: Divorced    Spouse name: Not on file   Number of children: 2   Years of education: Not on file   Highest education level: Not on file  Occupational History   Not on file  Tobacco Use   Smoking status: Never   Smokeless tobacco: Never  Vaping Use   Vaping status: Never Used  Substance and Sexual Activity   Alcohol use: No    Alcohol/week: 0.0 standard drinks of alcohol   Drug use: No   Sexual activity: Not on file  Other Topics Concern   Not on file  Social History Narrative   Work or School: none      Home Situation: lives alone, feels safe, son lives nearby      Spiritual Beliefs: Christian      Lifestyle: Y 3x per week, healthy diet       01/05/2019: Lives alone in one level house.   Goes to [employer] to do water aerobics 3X/week   Has son and daughter, son local      Social Drivers of Health   Financial Resource Strain: Low Risk  (11/06/2022)   Overall Financial Resource Strain (CARDIA)     Difficulty of Paying Living Expenses: Not hard at all  Food Insecurity: No Food Insecurity (10/05/2023)   Hunger Vital Sign    Worried About Running Out of Food in the Last Year: Never true    Ran Out of Food in the Last Year: Never true  Transportation Needs: No Transportation Needs (10/05/2023)   PRAPARE - Administrator, Civil Service (Medical): No    Lack of Transportation (Non-Medical): No  Physical Activity: Insufficiently Active (11/06/2022)   Exercise Vital Sign    Days of Exercise per Week: 2 days    Minutes of Exercise per Session: 40 min  Stress: No Stress Concern Present (11/06/2022)   [employer] of Occupational Health - Occupational Stress Questionnaire    Feeling of Stress : Not at all  Social Connections: Moderately Integrated (11/06/2022)   Social Connection and Isolation Panel [NHANES]    Frequency of Communication with Friends and Family: More than three times a week  Frequency of Social Gatherings with Friends and Family: More than three times a week    Attends Religious Services: More than 4 times per year    Active Member of Clubs or Organizations: Yes    Attends Engineer, Structural: More than 4 times per year    Marital Status: Divorced  Intimate Partner Violence: Not At Risk (10/05/2023)   Humiliation, Afraid, Rape, and Kick questionnaire    Fear of Current or Ex-Partner: No    Emotionally Abused: No    Physically Abused: No    Sexually Abused: No    Family History  Problem Relation Age of Onset   Hypertension Mother    Diabetes Mother    Hyperlipidemia Mother    Heart attack Mother    Heart disease Father    Heart attack Father    Stroke Brother    Heart disease Brother    Diabetes Brother    Uterine cancer Maternal Grandmother     Current Outpatient Medications on File Prior to Visit  Medication Sig Dispense Refill   acetaminophen  (TYLENOL ) 500 MG tablet Take 500 mg by mouth every 6 (six) hours as needed  (Arthritis).     amLODipine  (NORVASC ) 5 MG tablet Take 1 tablet (5 mg total) by mouth daily. 30 tablet 6   aspirin  81 MG EC tablet Take 81 mg by mouth at bedtime. Swallow whole.     BD PEN NEEDLE NANO 2ND GEN 32G X 4 MM MISC USE AS DIRECTED 90     cinacalcet  (SENSIPAR ) 30 MG tablet Take 30 mg by mouth every Monday, Wednesday, and Friday.     furosemide  (LASIX ) 20 MG tablet Take 40 mg by mouth every morning. Can take additional tablet if needed 30 tablet    gabapentin  (NEURONTIN ) 100 MG capsule Take 100 mg by mouth at bedtime.     HUMULIN 70/30 KWIKPEN (70-30) 100 UNIT/ML KwikPen Inject 4-8 Units into the skin See admin instructions. 4 units in the AM and 8 units in the pm     levothyroxine  (SYNTHROID ) 50 MCG tablet Take 50 mcg by mouth daily before breakfast.     nebivolol  (BYSTOLIC ) 10 MG tablet Take 1 tablet (10 mg total) by mouth daily. 30 tablet 3   OneTouch Delica Lancets 33G MISC Apply topically.     ONETOUCH VERIO test strip 1 each 2 (two) times daily.     rosuvastatin  (CRESTOR ) 40 MG tablet TAKE 1 TABLET BY MOUTH EVERYDAY AT BEDTIME 90 tablet 1   Trolamine Salicylate (BLUE-EMU HEMP EX) Apply 1 application  topically daily as needed (pain).     azithromycin  (ZITHROMAX ) 500 MG tablet Take 1 tablet (500 mg total) 1 hour prior to dental appointments (Patient not taking: Reported on 11/25/2023) 3 tablet 3   No current facility-administered medications on file prior to visit.    Allergies  Allergen Reactions   Lisinopril  Hives    Cough, hives, and lip edema    Nsaids Other (See Comments)    Abnormal renal function   Penicillins Rash and Other (See Comments)    Pt has taken Keflex without difficulty Has patient had a PCN reaction causing immediate rash, facial/tongue/throat swelling, SOB or lightheadedness with hypotension: yes Has patient had a PCN reaction causing severe rash involving mucus membranes or skin necrosis: no Has patient had a PCN reaction that required hospitalization  no Has patient had a PCN reaction occurring within the last 10 years: no If all of the above answers are NO, then  may proceed with Cephalosporin use.       Physical Exam Vitals requested from patient and listed below if patient had equipment and was able to obtain at home for this virtual visit: Vitals:   11/25/23 1651  BP: 125/68  Pulse: 60   Estimated body mass index is 32.95 kg/m as calculated from the following:   Height as of 11/18/23: 5' 3 (1.6 m).   Weight as of this encounter: 186 lb (84.4 kg).  EKG (optional): deferred due to virtual visit  GENERAL: alert, oriented, no acute distress detected, full vision exam deferred due to pandemic and/or virtual encounter  PSYCH/NEURO: pleasant and cooperative, no obvious depression or anxiety, speech and thought processing grossly intact, Cognitive function grossly intact  Flowsheet Row INTENSIVE CARDIAC REHAB ORIENT from 11/11/2023 in [facility]  PHQ-9 Total Score 0           11/11/2023   11:49 AM 09/08/2023    9:48 AM 02/05/2023   10:13 AM 10/07/2022    8:58 AM 12/15/2021    9:24 AM  Depression screen PHQ 2/9  Decreased Interest 0 0 0 0 0  Down, Depressed, Hopeless 0 0 0 0 0  PHQ - 2 Score 0 0 0 0 0  Altered sleeping 0 0 0    Tired, decreased energy 0 3 3    Change in appetite 0 0 0    Feeling bad or failure about yourself  0 0 0    Trouble concentrating 0 0 0    Moving slowly or fidgety/restless 0 0 0    Suicidal thoughts 0 0 0    PHQ-9 Score 0 3 3    Difficult doing work/chores Not difficult at all Somewhat difficult Somewhat difficult         11/06/2022    8:38 AM 02/05/2023    8:50 AM 09/08/2023    9:48 AM 11/18/2023   10:08 AM 11/22/2023    1:00 PM  Fall Risk  Falls in the past year? 0 0 0 0 0  Was there an injury with Fall? 0 0 0 0 0  Fall Risk Category Calculator 0 0 0 0 0  Fall Risk Category (Retired) Low      (RETIRED) Patient Fall  Risk Level Low fall risk      Patient at Risk for Falls Due to No Fall Risks No Fall Risks  Impaired balance/gait;Impaired mobility;Impaired vision;Other (Comment);Orthopedic patient Impaired balance/gait;Impaired mobility;Impaired vision;Orthopedic patient;Other (Comment)  Patient at Risk for Falls Due to - Comments    Uses cane for ambualtion uses cane  Fall risk Follow up Falls prevention discussed Falls evaluation completed Falls evaluation completed Falls evaluation completed Falls evaluation completed     SUMMARY AND PLAN:  Encounter for Medicare annual wellness exam    Discussed applicable health maintenance/preventive health measures and advised and referred or ordered per patient preferences: -she seen gyn for her bone density tests and plans to do once out of cardiac rehab -reports had her eye exam in march  -discussed the tetanus booster, she plans to get at the pharmacy -plans to follow up for labs and follow up in the office once done with cardiac rehab  Health Maintenance  Topic Date Due   DTaP/Tdap/Td (1 - Tdap) Never done   OPHTHALMOLOGY EXAM  01/31/2023   HEMOGLOBIN A1C  07/07/2023   COVID-19 Vaccine (6 - 2024-25 season) 12/11/2023 (Originally 07/25/2023)   FOOT EXAM  07/04/2024  Medicare Annual Wellness (AWV)  11/24/2024   Pneumonia Vaccine 31+ Years old  Completed   INFLUENZA VACCINE  Completed   DEXA SCAN  Completed   Zoster Vaccines- Shingrix  Completed   HPV VACCINES  Aged Out      Education and counseling on the following was provided based on the above review of health and a plan/checklist for the patient, along with additional information discussed, was provided for the patient in the patient instructions :  -Provided counseling and plan for increased risk of falling if applicable per above screening.She is doing cardiac rehab currently and advised requesting balance exercises she can continue once home.  -Advised and counseled on a healthy lifestyle -  including the importance of a healthy diet, regular physical activity, social connections  -Reviewed patient's current diet. Advised and counseled on a whole foods based healthy diet. A summary of a healthy diet was provided in the Patient Instructions.  -reviewed patient's current physical activity level and discussed exercise guidelines for adults. Discussed community resources and ideas for safe exercise at home to assist in meeting exercise guideline recommendations in a safe and healthy way. Advised while in cardiac rehab to request a regimen she can do at home once finished with the program to continue regular exercise. [facility] -Advise yearly dental visits at minimum and regular eye exams   Follow up: see patient instructions     Patient Instructions  I really enjoyed getting to talk with you today! I am available on Tuesdays and Thursdays for virtual visits if you have any questions or concerns, or if I can be of any further assistance.   CHECKLIST FROM ANNUAL WELLNESS VISIT:  -Follow up (please call to schedule if not scheduled after visit):   -yearly for annual wellness visit with primary care office  Here is a list of your preventive care/health maintenance measures and the plan for each if any are due:  PLAN For any measures below that may be due:   Health Maintenance  Topic Date Due   DTaP/Tdap/Td (1 - Tdap) Never done   OPHTHALMOLOGY EXAM  01/31/2023   HEMOGLOBIN A1C  07/07/2023   COVID-19 Vaccine (6 - 2024-25 season) 12/11/2023 (Originally 07/25/2023)   FOOT EXAM  07/04/2024   Medicare Annual Wellness (AWV)  11/24/2024   Pneumonia Vaccine 50+ Years old  Completed   INFLUENZA VACCINE  Completed   DEXA SCAN  Completed   Zoster Vaccines- Shingrix  Completed   HPV VACCINES  Aged Out    -See a dentist at least yearly  -Get your eyes checked and then per your eye specialist's recommendations  -Other issues addressed  today:   -I have included below further information regarding a healthy whole foods based diet, physical activity guidelines for adults, stress management and opportunities for social connections. I hope you find this information useful.   -----------------------------------------------------------------------------------------------------------------------------------------------------------------------------------------------------------------------------------------------------------  NUTRITION: -eat real food: lots of colorful vegetables (half the plate) and fruits -5-7 servings of vegetables and fruits per day (fresh or steamed is best), exp. 2 servings of vegetables with lunch and dinner and 2 servings of fruit per day. Berries and greens such as kale and collards are great choices.  -consume on a regular basis: whole grains (make sure first ingredient on label contains the word whole), fresh fruits, fish, nuts, seeds, healthy oils (such as olive oil, avocado oil, grape seed oil) -may eat small amounts of dairy and lean meat on  occasion, but avoid processed meats such as ham, bacon, lunch meat, etc. -drink water -try to avoid fast food and pre-packaged foods, processed meat -most experts advise limiting sodium to < 2300mg  per day, should limit further is any chronic conditions such as high blood pressure, heart disease, diabetes, etc. The American Heart Association advised that < 1500mg  is is ideal -try to avoid foods that contain any ingredients with names you do not recognize  -try to avoid sugar/sweets (except for the natural sugar that occurs in fresh fruit) -try to avoid sweet drinks -try to avoid white rice, white bread, pasta (unless whole grain), white or yellow potatoes  EXERCISE GUIDELINES FOR ADULTS: -if you wish to increase your physical activity, do so gradually and with the approval of your doctor -STOP and seek medical care immediately if you have any chest pain, chest  discomfort or trouble breathing when starting or increasing exercise  -move and stretch your body, legs, feet and arms when sitting for long periods -Physical activity guidelines for optimal health in adults: -least 150 minutes per week of aerobic exercise (can talk, but not sing) once approved by your doctor, 20-30 minutes of sustained activity or two 10 minute episodes of sustained activity every day.  -resistance training at least 2 days per week if approved by your doctor -balance exercises 3+ days per week:   Stand somewhere where you have something sturdy to hold onto if you lose balance.    1) lift up on toes, start with 5x per day and work up to 20x   2) stand and lift on leg straight out to the side so that foot is a few inches of the floor, start with 5x each side and work up to 20x each side   3) stand on one foot, start with 5 seconds each side and work up to 20 seconds on each side  If you need ideas or help with getting more active:  -Silver  sneakers https://tools.silversneakers.com  -Walk with a Doc: Http://www.duncan-williams.com/  -try to include resistance (weight lifting/strength building) and balance exercises twice per week: or the following link for ideas: http://castillo-powell.com/  buyducts.dk  STRESS MANAGEMENT: -can try meditating, or just sitting quietly with deep breathing while intentionally relaxing all parts of your body for 5 minutes daily -if you need further help with stress, anxiety or depression please follow up with your primary doctor or contact the wonderful folks at [facility]: [phone]  SOCIAL CONNECTIONS: -options in [place] if you wish to engage in more social and exercise related activities:  -Silver  sneakers https://tools.silversneakers.com  -Walk with a Doc: Http://www.duncan-williams.com/  -Check out the [facility] Active Adults 50+  section on the [place] [employer] (hiking clubs, book clubs, cards and games, chess, exercise classes, aquatic classes and much more) - see the website for details: https://www.[place].gov/departments/parks-recreation/active-adults50  -YouTube has lots of exercise videos for different ages and abilities as well  -[facility] (a variety of indoor and outdoor inperson activities for adults). 5802288005. [address]  -Virtual Online Classes (a variety of topics): see seniorplanet.org or call ([phone])[phone]  -consider volunteering at a school, hospice center, church, senior center or elsewhere           Chiquita JONELLE Cramp, DO

## 2023-11-26 ENCOUNTER — Encounter (HOSPITAL_COMMUNITY)
Admission: RE | Admit: 2023-11-26 | Discharge: 2023-11-26 | Disposition: A | Discharge status: Home / Self Care | Payer: Medicare HMO | Source: Ambulatory Visit | Attending: Cardiovascular Disease | Admitting: Cardiovascular Disease

## 2023-11-26 DIAGNOSIS — I4891 Unspecified atrial fibrillation: Secondary | ICD-10-CM

## 2023-11-26 DIAGNOSIS — Z952 Presence of prosthetic heart valve: Secondary | ICD-10-CM

## 2023-11-26 LAB — GLUCOSE, CAPILLARY
Glucose-Capillary: 114 mg/dL — ABNORMAL HIGH (ref 70–99)
Glucose-Capillary: 107 mg/dL — ABNORMAL HIGH (ref 70–99)

## 2023-11-29 ENCOUNTER — Encounter (HOSPITAL_COMMUNITY)
Admission: RE | Admit: 2023-11-29 | Discharge: 2023-11-29 | Disposition: A | Discharge status: Home / Self Care | Payer: Medicare HMO | Source: Ambulatory Visit | Attending: Cardiovascular Disease | Admitting: Cardiovascular Disease

## 2023-11-29 DIAGNOSIS — I4891 Unspecified atrial fibrillation: Secondary | ICD-10-CM | POA: Diagnosis not present

## 2023-11-29 DIAGNOSIS — Z952 Presence of prosthetic heart valve: Secondary | ICD-10-CM

## 2023-11-29 LAB — GLUCOSE, CAPILLARY: Glucose-Capillary: 162 mg/dL — ABNORMAL HIGH (ref 70–99)

## 2023-11-29 NOTE — Progress Notes (Signed)
 Cardiac Individual Treatment Plan  Patient Details  Name: [facility]avez MRN: [id number] Date of Birth: 1934/08/03 Referring Provider:   Flowsheet Row INTENSIVE CARDIAC REHAB from 11/18/2023 [facility]  Referring Provider Bonnie Sor, MD       Initial Encounter Date:  Flowsheet Row INTENSIVE CARDIAC REHAB from 11/18/2023 in [facility]  Date 11/18/23       Visit Diagnosis: 10/05/23 TAVR  Atrial fibrillation, unspecified type (HCC)  Patient's Home Medications on Admission:  Current Outpatient Medications:    acetaminophen  (TYLENOL ) 500 MG tablet, Take 500 mg by mouth every 6 (six) hours as needed (Arthritis)., Disp: , Rfl:    amLODipine  (NORVASC ) 5 MG tablet, Take 1 tablet (5 mg total) by mouth daily., Disp: 30 tablet, Rfl: 6   aspirin  81 MG EC tablet, Take 81 mg by mouth at bedtime. Swallow whole., Disp: , Rfl:    azithromycin  (ZITHROMAX ) 500 MG tablet, Take 1 tablet (500 mg total) 1 hour prior to dental appointments (Patient not taking: Reported on 11/25/2023), Disp: 3 tablet, Rfl: 3   BD PEN NEEDLE NANO 2ND GEN 32G X 4 MM MISC, USE AS DIRECTED 90, Disp: , Rfl:    cinacalcet  (SENSIPAR ) 30 MG tablet, Take 30 mg by mouth every Monday, Wednesday, and Friday., Disp: , Rfl:    furosemide  (LASIX ) 20 MG tablet, Take 40 mg by mouth every morning. Can take additional tablet if needed, Disp: 30 tablet, Rfl:    gabapentin  (NEURONTIN ) 100 MG capsule, Take 100 mg by mouth at bedtime., Disp: , Rfl:    HUMULIN 70/30 KWIKPEN (70-30) 100 UNIT/ML KwikPen, Inject 4-8 Units into the skin See admin instructions. 4 units in the AM and 8 units in the pm, Disp: , Rfl:    levothyroxine  (SYNTHROID ) 50 MCG tablet, Take 50 mcg by mouth daily before breakfast., Disp: , Rfl:    nebivolol  (BYSTOLIC ) 10 MG tablet, Take 1 tablet (10 mg total) by mouth daily., Disp: 30 tablet, Rfl: 3   OneTouch  Delica Lancets 33G MISC, Apply topically., Disp: , Rfl:    ONETOUCH VERIO test strip, 1 each 2 (two) times daily., Disp: , Rfl:    rosuvastatin  (CRESTOR ) 40 MG tablet, TAKE 1 TABLET BY MOUTH EVERYDAY AT BEDTIME, Disp: 90 tablet, Rfl: 1   Trolamine Salicylate (BLUE-EMU HEMP EX), Apply 1 application  topically daily as needed (pain)., Disp: , Rfl:   Past Medical History: Past Medical History:  Diagnosis Date   (HFpEF) heart failure with preserved ejection fraction (HCC)    Anemia    History of , resolved   Carpal tunnel syndrome    Chronic low back pain 02/27/2016   -DDD -seeing [facility]    CKD stage 3b, GFR 30-44 ml/min (HCC)    Diabetes mellitus    type 2, sees Dr. Tommas   Goiter    Dr. Tommas   History of blood transfusion    auto transfusion post knee replacement   Hyperlipidemia    Hyperparathyroidism (HCC) 02/27/2016   -sees endocrinologist, Dr. Tommas, has seen surgeon as well    Hypertension    Migraine    no longer has migraines (80's) after menopause   Mitral valve prolapse    Neuropathy    Osteoarthritis    DDD, sees [place] [facility]   PMB (postmenopausal bleeding) 02/28/2009   Resolved   S/P TAVR (transcatheter aortic valve replacement) 10/05/2023  s/p TAVR with a 23 mm Edwards S3UR via the TF approach by Dr. Wonda and [facility]   Severe aortic stenosis    Vaginal atrophy    Vitamin D  deficiency     Tobacco Use: Social History   Tobacco Use  Smoking Status Never  Smokeless Tobacco Never    Labs: Review Flowsheet  More data exists      Latest Ref Rng & Units 06/09/2018 12/27/2019 12/15/2021 01/05/2023 10/05/2023  Labs for ITP Cardiac and Pulmonary Rehab  Cholestrol 0 - 200 - - - 173     -  HDL-C 35 - 70 - - - 60     -  Trlycerides 40 - 160 - - - 77     -  Hemoglobin A1c - 7.3  7.8  7.3  7.6     -  TCO2 22 - 32 mmol/L - - - - 25     Details       This result is from an external source.         Capillary Blood Glucose: Lab Results   Component Value Date   GLUCAP 162 (H) 11/29/2023   GLUCAP 107 (H) 11/26/2023   GLUCAP 114 (H) 11/26/2023   GLUCAP 135 (H) 11/22/2023   GLUCAP 144 (H) 11/22/2023     Exercise Target Goals: Exercise Program Goal: Individual exercise prescription set using results from initial 6 min walk test and THRR while considering  patient's activity barriers and safety.   Exercise Prescription Goal: Initial exercise prescription builds to 30-45 minutes a day of aerobic activity, 2-3 days per week.  Home exercise guidelines will be given to patient during program as part of exercise prescription that the participant will acknowledge.  Activity Barriers & Risk Stratification:  Activity Barriers & Cardiac Risk Stratification - 11/18/23 1021       Activity Barriers & Cardiac Risk Stratification   Activity Barriers Left Knee Replacement;Right Knee Replacement;Neck/Spine Problems;Joint Problems;Deconditioning;Muscular Weakness;Back Problems;Arthritis    Cardiac Risk Stratification High             6 Minute Walk:  6 Minute Walk     Row Name 11/18/23 1200         6 Minute Walk   Phase Initial  Used Go-cart     Distance 970 feet     Walk Time 6 minutes     # of Rest Breaks 0     MPH 1.84     METS 1     RPE 11     Perceived Dyspnea  0     VO2 Peak 3.22     Symptoms No     Resting HR 65 bpm     Resting BP 140/64     Resting Oxygen Saturation  100 %     Exercise Oxygen Saturation  during 6 min walk 98 %     Max Ex. HR 76 bpm     Max Ex. BP 152/78     2 Minute Post BP 134/70              Oxygen Initial Assessment:   Oxygen Re-Evaluation:   Oxygen Discharge (Final Oxygen Re-Evaluation):   Initial Exercise Prescription:  Initial Exercise Prescription - 11/18/23 1200       Date of Initial Exercise RX and Referring Provider   Date 11/18/23    Referring Provider Bonnie Sor, MD    Expected Discharge Date 02/02/24      NuStep   Level 3  SPM 75    Minutes 25     METs 1      Prescription Details   Frequency (times per week) 3    Duration Progress to 30 minutes of continuous aerobic without signs/symptoms of physical distress      Intensity   THRR 40-80% of Max Heartrate 53-106    Ratings of Perceived Exertion 11-13    Perceived Dyspnea 0-4      Progression   Progression Continue progressive overload as per policy without signs/symptoms or physical distress.      Resistance Training   Training Prescription Yes    Weight 2 lbs    Reps 10-15             Perform Capillary Blood Glucose checks as needed.  Exercise Prescription Changes:   Exercise Prescription Changes     Row Name 11/22/23 1600             Response to Exercise   Blood Pressure (Admit) 142/64       Blood Pressure (Exercise) 152/60       Blood Pressure (Exit) 138/72       Heart Rate (Admit) 68 bpm       Heart Rate (Exercise) 95 bpm       Heart Rate (Exit) 77 bpm       Rating of Perceived Exertion (Exercise) 11       Symptoms None       Comments Pt's first day in the CRP2 program       Duration Progress to 30 minutes of  aerobic without signs/symptoms of physical distress       Intensity THRR unchanged         Progression   Progression Continue to progress workloads to maintain intensity without signs/symptoms of physical distress.       Average METs 1.9         Resistance Training   Training Prescription Yes       Weight 2 lbs       Reps 10-15       Time 10 Minutes         Interval Training   Interval Training No         NuStep   Level 1       SPM 75       Minutes 25       METs 1.9                Exercise Comments:   Exercise Comments     Row Name 11/22/23 1624           Exercise Comments Pt's first day in the CRP2 program. Pt exercised today with no complaints.                Exercise Goals and Review:   Exercise Goals     Row Name 11/11/23 0924             Exercise Goals   Increase Physical Activity Yes        Intervention Provide advice, education, support and counseling about physical activity/exercise needs.;Develop an individualized exercise prescription for aerobic and resistive training based on initial evaluation findings, risk stratification, comorbidities and participant's personal goals.       Expected Outcomes Short Term: Attend rehab on a regular basis to increase amount of physical activity.;Long Term: Exercising regularly at least 3-5 days a week.;Long Term: Add in home exercise to make exercise part of routine and to  increase amount of physical activity.       Increase Strength and Stamina Yes       Intervention Provide advice, education, support and counseling about physical activity/exercise needs.;Develop an individualized exercise prescription for aerobic and resistive training based on initial evaluation findings, risk stratification, comorbidities and participant's personal goals.       Expected Outcomes Short Term: Increase workloads from initial exercise prescription for resistance, speed, and METs.;Short Term: Perform resistance training exercises routinely during rehab and add in resistance training at home;Long Term: Improve cardiorespiratory fitness, muscular endurance and strength as measured by increased METs and functional capacity ( )       Able to understand and use rate of perceived exertion (RPE) scale Yes       Intervention Provide education and explanation on how to use RPE scale       Expected Outcomes Short Term: Able to use RPE daily in rehab to express subjective intensity level;Long Term:  Able to use RPE to guide intensity level when exercising independently       Knowledge and understanding of Target Heart Rate Range (THRR) Yes       Intervention Provide education and explanation of THRR including how the numbers were predicted and where they are located for reference       Expected Outcomes Short Term: Able to state/look up THRR;Short Term: Able to use daily as  guideline for intensity in rehab;Long Term: Able to use THRR to govern intensity when exercising independently       Understanding of Exercise Prescription Yes       Intervention Provide education, explanation, and written materials on patient's individual exercise prescription       Expected Outcomes Short Term: Able to explain program exercise prescription;Long Term: Able to explain home exercise prescription to exercise independently                Exercise Goals Re-Evaluation :  Exercise Goals Re-Evaluation     Row Name 11/22/23 1623             Exercise Goal Re-Evaluation   Exercise Goals Review Increase Physical Activity;Increase Strength and Stamina;Able to understand and use rate of perceived exertion (RPE) scale;Knowledge and understanding of Target Heart Rate Range (THRR);Understanding of Exercise Prescription       Comments Pt's first day in the CRP2 program. Pt understnads the Exercise Rx, RPE scale, and THRR.       Expected Outcomes Will continue to montior and progress exercise workloads as tolerated.                Discharge Exercise Prescription (Final Exercise Prescription Changes):  Exercise Prescription Changes - 11/22/23 1600       Response to Exercise   Blood Pressure (Admit) 142/64    Blood Pressure (Exercise) 152/60    Blood Pressure (Exit) 138/72    Heart Rate (Admit) 68 bpm    Heart Rate (Exercise) 95 bpm    Heart Rate (Exit) 77 bpm    Rating of Perceived Exertion (Exercise) 11    Symptoms None    Comments Pt's first day in the CRP2 program    Duration Progress to 30 minutes of  aerobic without signs/symptoms of physical distress    Intensity THRR unchanged      Progression   Progression Continue to progress workloads to maintain intensity without signs/symptoms of physical distress.    Average METs 1.9      Resistance Training   Training Prescription Yes  Weight 2 lbs    Reps 10-15    Time 10 Minutes      Interval Training    Interval Training No      NuStep   Level 1    SPM 75    Minutes 25    METs 1.9             Nutrition:  Target Goals: Understanding of nutrition guidelines, daily intake of sodium 1500mg , cholesterol 200mg , calories 30% from fat and 7% or less from saturated fats, daily to have 5 or more servings of fruits and vegetables.  Biometrics:  Pre Biometrics - 11/18/23 1015       Pre Biometrics   Waist Circumference 44.5 inches    Hip Circumference 46.25 inches    Waist to Hip Ratio 0.96 %    Triceps Skinfold 45 mm    % Body Fat 49.5 %    Grip Strength 13 kg    Flexibility --   No performed due to back issues   Single Leg Stand 1.2 seconds              Nutrition Therapy Plan and Nutrition Goals:   Nutrition Assessments:  MEDIFICTS Score Key: >=70 Need to make dietary changes  40-70 Heart Healthy Diet <= 40 Therapeutic Level Cholesterol Diet   Flowsheet Row INTENSIVE CARDIAC REHAB from 11/18/2023 in [facility]  Picture Your Plate Total Score on Admission 73      Picture Your Plate Scores: <59 Unhealthy dietary pattern with much room for improvement. 41-50 Dietary pattern unlikely to meet recommendations for good health and room for improvement. 51-60 More healthful dietary pattern, with some room for improvement.  >60 Healthy dietary pattern, although there may be some specific behaviors that could be improved.    Nutrition Goals Re-Evaluation:   Nutrition Goals Re-Evaluation:   Nutrition Goals Discharge (Final Nutrition Goals Re-Evaluation):   Psychosocial: Target Goals: Acknowledge presence or absence of significant depression and/or stress, maximize coping skills, provide positive support system. Participant is able to verbalize types and ability to use techniques and skills needed for reducing stress and depression.  Initial Review & Psychosocial Screening:  Initial Psych Review &  Screening - 11/11/23 1150       Initial Review   Current issues with None Identified      Family Dynamics   Good Support System? Yes   [facility] has her two sons, sister, sister in law and nieces for support     Barriers   Psychosocial barriers to participate in program There are no identifiable barriers or psychosocial needs.      Screening Interventions   Interventions Encouraged to exercise             Quality of Life Scores:  Quality of Life - 11/18/23 1205       Quality of Life   Select Quality of Life      Quality of Life Scores   Health/Function Pre 25.47 %    Socioeconomic Pre 25.79 %    Psych/Spiritual Pre 28.29 %    Family Pre 26.3 %    GLOBAL Pre 26.24 %            Scores of 19 and below usually indicate a poorer quality of life in these areas.  A difference of  2-3 points is a clinically meaningful difference.  A difference of 2-3 points in the total score of the Quality of Life  Index has been associated with significant improvement in overall quality of life, self-image, physical symptoms, and general health in studies assessing change in quality of life.  PHQ-9: Review Flowsheet  More data exists      11/11/2023 09/08/2023 02/05/2023 10/07/2022 12/15/2021  Depression screen PHQ 2/9  Decreased Interest 0 0 0 0 0  Down, Depressed, Hopeless 0 0 0 0 0  PHQ - 2 Score 0 0 0 0 0  Altered sleeping 0 0 0 - -  Tired, decreased energy 0 3 3 - -  Change in appetite 0 0 0 - -  Feeling bad or failure about yourself  0 0 0 - -  Trouble concentrating 0 0 0 - -  Moving slowly or fidgety/restless 0 0 0 - -  Suicidal thoughts 0 0 0 - -  PHQ-9 Score 0 3 3 - -  Difficult doing work/chores Not difficult at all Somewhat difficult Somewhat difficult - -   Interpretation of Total Score  Total Score Depression Severity:  1-4 = Minimal depression, 5-9 = Mild depression, 10-14 = Moderate depression, 15-19 = Moderately severe depression, 20-27 = Severe depression    Psychosocial Evaluation and Intervention:   Psychosocial Re-Evaluation:  Psychosocial Re-Evaluation     Row Name 11/23/23 1344 11/29/23 1429           Psychosocial Re-Evaluation   Current issues with None Identified None Identified      Interventions Encouraged to attend Cardiac Rehabilitation for the exercise Encouraged to attend Cardiac Rehabilitation for the exercise      Continue Psychosocial Services  No Follow up required No Follow up required               Psychosocial Discharge (Final Psychosocial Re-Evaluation):  Psychosocial Re-Evaluation - 11/29/23 1429       Psychosocial Re-Evaluation   Current issues with None Identified    Interventions Encouraged to attend Cardiac Rehabilitation for the exercise    Continue Psychosocial Services  No Follow up required             Vocational Rehabilitation: Provide vocational rehab assistance to qualifying candidates.   Vocational Rehab Evaluation & Intervention:  Vocational Rehab - 11/11/23 1150       Initial Vocational Rehab Evaluation & Intervention   Assessment shows need for Vocational Rehabilitation No   [facility] is retired and does not need vocational rehab at this time.            Education: Education Goals: Education classes will be provided on a weekly basis, covering required topics. Participant will state understanding/return demonstration of topics presented.    Education     Row Name 11/22/23 1600     Education   Cardiac Education Topics Pritikin   Geographical Information Systems Officer Psychosocial   Psychosocial Workshop Focused Goals, Sustainable Changes   Instruction Review Code 1- Verbalizes Understanding   Class Start Time 1400   Class Stop Time 1440   Class Time Calculation (min) 40 min    Row Name 11/26/23 1400     Education   Cardiac Education Topics Pritikin   Licensed Conveyancer  Nutrition   Nutrition Dining Out - Part 1   Instruction Review Code 1- Verbalizes Understanding   Class Start Time 1358   Class Stop Time 1442   Class Time Calculation (min) 44 min  Row Name 11/29/23 1400     Education   Cardiac Education Topics Pritikin   Psychologist, Forensic Exercise Education   Exercise Education Biomechanial Limitations   Instruction Review Code 1- Verbalizes Understanding   Class Start Time 1400   Class Stop Time 1440   Class Time Calculation (min) 40 min            Core Videos: Exercise    Move It!  Clinical staff conducted group or individual video education with verbal and written material and guidebook.  Patient learns the recommended Pritikin exercise program. Exercise with the goal of living a long, healthy life. Some of the health benefits of exercise include controlled diabetes, healthier blood pressure levels, improved cholesterol levels, improved heart and lung capacity, improved sleep, and better body composition. Everyone should speak with their doctor before starting or changing an exercise routine.  Biomechanical Limitations Clinical staff conducted group or individual video education with verbal and written material and guidebook.  Patient learns how biomechanical limitations can impact exercise and how we can mitigate and possibly overcome limitations to have an impactful and balanced exercise routine.  Body Composition Clinical staff conducted group or individual video education with verbal and written material and guidebook.  Patient learns that body composition (ratio of muscle mass to fat mass) is a key component to assessing overall fitness, rather than body weight alone. Increased fat mass, especially visceral belly fat, can put us  at increased risk for metabolic syndrome, type 2 diabetes, heart disease, and even death. It is recommended to combine diet and exercise  (cardiovascular and resistance training) to improve your body composition. Seek guidance from your physician and exercise physiologist before implementing an exercise routine.  Exercise Action Plan Clinical staff conducted group or individual video education with verbal and written material and guidebook.  Patient learns the recommended strategies to achieve and enjoy long-term exercise adherence, including variety, self-motivation, self-efficacy, and positive decision making. Benefits of exercise include fitness, good health, weight management, more energy, better sleep, less stress, and overall well-being.  Medical   Heart Disease Risk Reduction Clinical staff conducted group or individual video education with verbal and written material and guidebook.  Patient learns our heart is our most vital organ as it circulates oxygen, nutrients, white blood cells, and hormones throughout the entire body, and carries waste away. Data supports a plant-based eating plan like the Pritikin Program for its effectiveness in slowing progression of and reversing heart disease. The video provides a number of recommendations to address heart disease.   Metabolic Syndrome and Belly Fat  Clinical staff conducted group or individual video education with verbal and written material and guidebook.  Patient learns what metabolic syndrome is, how it leads to heart disease, and how one can reverse it and keep it from coming back. You have metabolic syndrome if you have 3 of the following 5 criteria: abdominal obesity, high blood pressure, high triglycerides, low HDL cholesterol, and high blood sugar.  Hypertension and Heart Disease Clinical staff conducted group or individual video education with verbal and written material and guidebook.  Patient learns that high blood pressure, or hypertension, is very common in the [place] . Hypertension is largely due to excessive salt intake, but other important risk factors  include being overweight, physical inactivity, drinking too much alcohol, smoking, and not eating enough potassium from fruits and vegetables. High blood pressure is a leading risk factor  for heart attack, stroke, congestive heart failure, dementia, kidney failure, and premature death. Long-term effects of excessive salt intake include stiffening of the arteries and thickening of heart muscle and organ damage. Recommendations include ways to reduce hypertension and the risk of heart disease.  Diseases of Our Time - Focusing on Diabetes Clinical staff conducted group or individual video education with verbal and written material and guidebook.  Patient learns why the best way to stop diseases of our time is prevention, through food and other lifestyle changes. Medicine (such as prescription pills and surgeries) is often only a Band-Aid on the problem, not a long-term solution. Most common diseases of our time include obesity, type 2 diabetes, hypertension, heart disease, and cancer. The Pritikin Program is recommended and has been proven to help reduce, reverse, and/or prevent the damaging effects of metabolic syndrome.  Nutrition   Overview of the Pritikin Eating Plan  Clinical staff conducted group or individual video education with verbal and written material and guidebook.  Patient learns about the Pritikin Eating Plan for disease risk reduction. The Pritikin Eating Plan emphasizes a wide variety of unrefined, minimally-processed carbohydrates, like fruits, vegetables, whole grains, and legumes. Go, Caution, and Stop food choices are explained. Plant-based and lean animal proteins are emphasized. Rationale provided for low sodium intake for blood pressure control, low added sugars for blood sugar stabilization, and low added fats and oils for coronary artery disease risk reduction and weight management.  Calorie Density  Clinical staff conducted group or individual video education with verbal and  written material and guidebook.  Patient learns about calorie density and how it impacts the Pritikin Eating Plan. Knowing the characteristics of the food you choose will help you decide whether those foods will lead to weight gain or weight loss, and whether you want to consume more or less of them. Weight loss is usually a side effect of the Pritikin Eating Plan because of its focus on low calorie-dense foods.  Label Reading  Clinical staff conducted group or individual video education with verbal and written material and guidebook.  Patient learns about the Pritikin recommended label reading guidelines and corresponding recommendations regarding calorie density, added sugars, sodium content, and whole grains.  Dining Out - Part 1  Clinical staff conducted group or individual video education with verbal and written material and guidebook.  Patient learns that restaurant meals can be sabotaging because they can be so high in calories, fat, sodium, and/or sugar. Patient learns recommended strategies on how to positively address this and avoid unhealthy pitfalls.  Facts on Fats  Clinical staff conducted group or individual video education with verbal and written material and guidebook.  Patient learns that lifestyle modifications can be just as effective, if not more so, as many medications for lowering your risk of heart disease. A Pritikin lifestyle can help to reduce your risk of inflammation and atherosclerosis (cholesterol build-up, or plaque, in the artery walls). Lifestyle interventions such as dietary choices and physical activity address the cause of atherosclerosis. A review of the types of fats and their impact on blood cholesterol levels, along with dietary recommendations to reduce fat intake is also included.  Nutrition Action Plan  Clinical staff conducted group or individual video education with verbal and written material and guidebook.  Patient learns how to incorporate Pritikin  recommendations into their lifestyle. Recommendations include planning and keeping personal health goals in mind as an important part of their success.  Healthy Mind-Set    Healthy Minds, Bodies, Hearts  Clinical staff conducted group or individual video education with verbal and written material and guidebook.  Patient learns how to identify when they are stressed. Video will discuss the impact of that stress, as well as the many benefits of stress management. Patient will also be introduced to stress management techniques. The way we think, act, and feel has an impact on our hearts.  How Our Thoughts Can Heal Our Hearts  Clinical staff conducted group or individual video education with verbal and written material and guidebook.  Patient learns that negative thoughts can cause depression and anxiety. This can result in negative lifestyle behavior and serious health problems. Cognitive behavioral therapy is an effective method to help control our thoughts in order to change and improve our emotional outlook.  Additional Videos:  Exercise    Improving Performance  Clinical staff conducted group or individual video education with verbal and written material and guidebook.  Patient learns to use a non-linear approach by alternating intensity levels and lengths of time spent exercising to help burn more calories and lose more body fat. Cardiovascular exercise helps improve heart health, metabolism, hormonal balance, blood sugar control, and recovery from fatigue. Resistance training improves strength, endurance, balance, coordination, reaction time, metabolism, and muscle mass. Flexibility exercise improves circulation, posture, and balance. Seek guidance from your physician and exercise physiologist before implementing an exercise routine and learn your capabilities and proper form for all exercise.  Introduction to Yoga  Clinical staff conducted group or individual video education with verbal and  written material and guidebook.  Patient learns about yoga, a discipline of the coming together of mind, breath, and body. The benefits of yoga include improved flexibility, improved range of motion, better posture and core strength, increased lung function, weight loss, and positive self-image. Yoga's heart health benefits include lowered blood pressure, healthier heart rate, decreased cholesterol and triglyceride levels, improved immune function, and reduced stress. Seek guidance from your physician and exercise physiologist before implementing an exercise routine and learn your capabilities and proper form for all exercise.  Medical   Aging: Enhancing Your Quality of Life  Clinical staff conducted group or individual video education with verbal and written material and guidebook.  Patient learns key strategies and recommendations to stay in good physical health and enhance quality of life, such as prevention strategies, having an advocate, securing a Health Care Proxy and Power of Attorney, and keeping a list of medications and system for tracking them. It also discusses how to avoid risk for bone loss.  Biology of Weight Control  Clinical staff conducted group or individual video education with verbal and written material and guidebook.  Patient learns that weight gain occurs because we consume more calories than we burn (eating more, moving less). Even if your body weight is normal, you may have higher ratios of fat compared to muscle mass. Too much body fat puts you at increased risk for cardiovascular disease, heart attack, stroke, type 2 diabetes, and obesity-related cancers. In addition to exercise, following the Pritikin Eating Plan can help reduce your risk.  Decoding Lab Results  Clinical staff conducted group or individual video education with verbal and written material and guidebook.  Patient learns that lab test reflects one measurement whose values change over time and are influenced  by many factors, including medication, stress, sleep, exercise, food, hydration, pre-existing medical conditions, and more. It is recommended to use the knowledge from this video to become more involved with your lab results and evaluate your numbers to  speak with your doctor.   Diseases of Our Time - Overview  Clinical staff conducted group or individual video education with verbal and written material and guidebook.  Patient learns that according to the CDC, 50% to 70% of chronic diseases (such as obesity, type 2 diabetes, elevated lipids, hypertension, and heart disease) are avoidable through lifestyle improvements including healthier food choices, listening to satiety cues, and increased physical activity.  Sleep Disorders Clinical staff conducted group or individual video education with verbal and written material and guidebook.  Patient learns how good quality and duration of sleep are important to overall health and well-being. Patient also learns about sleep disorders and how they impact health along with recommendations to address them, including discussing with a physician.  Nutrition  Dining Out - Part 2 Clinical staff conducted group or individual video education with verbal and written material and guidebook.  Patient learns how to plan ahead and communicate in order to maximize their dining experience in a healthy and nutritious manner. Included are recommended food choices based on the type of restaurant the patient is visiting.   Fueling a Banker conducted group or individual video education with verbal and written material and guidebook.  There is a strong connection between our food choices and our health. Diseases like obesity and type 2 diabetes are very prevalent and are in large-part due to lifestyle choices. The Pritikin Eating Plan provides plenty of food and hunger-curbing satisfaction. It is easy to follow, affordable, and helps reduce health  risks.  Menu Workshop  Clinical staff conducted group or individual video education with verbal and written material and guidebook.  Patient learns that restaurant meals can sabotage health goals because they are often packed with calories, fat, sodium, and sugar. Recommendations include strategies to plan ahead and to communicate with the manager, chef, or server to help order a healthier meal.  Planning Your Eating Strategy  Clinical staff conducted group or individual video education with verbal and written material and guidebook.  Patient learns about the Pritikin Eating Plan and its benefit of reducing the risk of disease. The Pritikin Eating Plan does not focus on calories. Instead, it emphasizes high-quality, nutrient-rich foods. By knowing the characteristics of the foods, we choose, we can determine their calorie density and make informed decisions.  Targeting Your Nutrition Priorities  Clinical staff conducted group or individual video education with verbal and written material and guidebook.  Patient learns that lifestyle habits have a tremendous impact on disease risk and progression. This video provides eating and physical activity recommendations based on your personal health goals, such as reducing LDL cholesterol, losing weight, preventing or controlling type 2 diabetes, and reducing high blood pressure.  Vitamins and Minerals  Clinical staff conducted group or individual video education with verbal and written material and guidebook.  Patient learns different ways to obtain key vitamins and minerals, including through a recommended healthy diet. It is important to discuss all supplements you take with your doctor.   Healthy Mind-Set    Smoking Cessation  Clinical staff conducted group or individual video education with verbal and written material and guidebook.  Patient learns that cigarette smoking and tobacco addiction pose a serious health risk which affects millions of  people. Stopping smoking will significantly reduce the risk of heart disease, lung disease, and many forms of cancer. Recommended strategies for quitting are covered, including working with your doctor to develop a successful plan.  Culinary   Becoming a Neurosurgeon  Clinical staff conducted group or individual video education with verbal and written material and guidebook.  Patient learns that cooking at home can be healthy, cost-effective, quick, and puts them in control. Keys to cooking healthy recipes will include looking at your recipe, assessing your equipment needs, planning ahead, making it simple, choosing cost-effective seasonal ingredients, and limiting the use of added fats, salts, and sugars.  Cooking - Breakfast and Snacks  Clinical staff conducted group or individual video education with verbal and written material and guidebook.  Patient learns how important breakfast is to satiety and nutrition through the entire day. Recommendations include key foods to eat during breakfast to help stabilize blood sugar levels and to prevent overeating at meals later in the day. Planning ahead is also a key component.  Cooking - Educational Psychologist conducted group or individual video education with verbal and written material and guidebook.  Patient learns eating strategies to improve overall health, including an approach to cook more at home. Recommendations include thinking of animal protein as a side on your plate rather than center stage and focusing instead on lower calorie dense options like vegetables, fruits, whole grains, and plant-based proteins, such as beans. Making sauces in large quantities to freeze for later and leaving the skin on your vegetables are also recommended to maximize your experience.  Cooking - Healthy Salads and Dressing Clinical staff conducted group or individual video education with verbal and written material and guidebook.  Patient learns that  vegetables, fruits, whole grains, and legumes are the foundations of the Pritikin Eating Plan. Recommendations include how to incorporate each of these in flavorful and healthy salads, and how to create homemade salad dressings. Proper handling of ingredients is also covered. Cooking - Soups and [employer] - Soups and Desserts Clinical staff conducted group or individual video education with verbal and written material and guidebook.  Patient learns that Pritikin soups and desserts make for easy, nutritious, and delicious snacks and meal components that are low in sodium, fat, sugar, and calorie density, while high in vitamins, minerals, and filling fiber. Recommendations include simple and healthy ideas for soups and desserts.   Overview     The Pritikin Solution Program Overview Clinical staff conducted group or individual video education with verbal and written material and guidebook.  Patient learns that the results of the Pritikin Program have been documented in more than 100 articles published in peer-reviewed journals, and the benefits include reducing risk factors for (and, in some cases, even reversing) high cholesterol, high blood pressure, type 2 diabetes, obesity, and more! An overview of the three key pillars of the Pritikin Program will be covered: eating well, doing regular exercise, and having a healthy mind-set.  WORKSHOPS  Exercise: Exercise Basics: Building Your Action Plan Clinical staff led group instruction and group discussion with PowerPoint presentation and patient guidebook. To enhance the learning environment the use of posters, models and videos may be added. At the conclusion of this workshop, patients will comprehend the difference between physical activity and exercise, as well as the benefits of incorporating both, into their routine. Patients will understand the FITT (Frequency, Intensity, Time, and Type) principle and how to use it to build an exercise action  plan. In addition, safety concerns and other considerations for exercise and cardiac rehab will be addressed by the presenter. The purpose of this lesson is to promote a comprehensive and effective weekly exercise routine in order to improve patients' overall level of fitness.  Managing Heart Disease: Your Path to a Healthier Heart Clinical staff led group instruction and group discussion with PowerPoint presentation and patient guidebook. To enhance the learning environment the use of posters, models and videos may be added.At the conclusion of this workshop, patients will understand the anatomy and physiology of the heart. Additionally, they will understand how Pritikin's three pillars impact the risk factors, the progression, and the management of heart disease.  The purpose of this lesson is to provide a high-level overview of the heart, heart disease, and how the Pritikin lifestyle positively impacts risk factors.  Exercise Biomechanics Clinical staff led group instruction and group discussion with PowerPoint presentation and patient guidebook. To enhance the learning environment the use of posters, models and videos may be added. Patients will learn how the structural parts of their bodies function and how these functions impact their daily activities, movement, and exercise. Patients will learn how to promote a neutral spine, learn how to manage pain, and identify ways to improve their physical movement in order to promote healthy living. The purpose of this lesson is to expose patients to common physical limitations that impact physical activity. Participants will learn practical ways to adapt and manage aches and pains, and to minimize their effect on regular exercise. Patients will learn how to maintain good posture while sitting, walking, and lifting.  Balance Training and Fall Prevention  Clinical staff led group instruction and group discussion with PowerPoint presentation and  patient guidebook. To enhance the learning environment the use of posters, models and videos may be added. At the conclusion of this workshop, patients will understand the importance of their sensorimotor skills (vision, proprioception, and the vestibular system) in maintaining their ability to balance as they age. Patients will apply a variety of balancing exercises that are appropriate for their current level of function. Patients will understand the common causes for poor balance, possible solutions to these problems, and ways to modify their physical environment in order to minimize their fall risk. The purpose of this lesson is to teach patients about the importance of maintaining balance as they age and ways to minimize their risk of falling.  WORKSHOPS   Nutrition:  Fueling a Ship Broker led group instruction and group discussion with PowerPoint presentation and patient guidebook. To enhance the learning environment the use of posters, models and videos may be added. Patients will review the foundational principles of the Pritikin Eating Plan and understand what constitutes a serving size in each of the food groups. Patients will also learn Pritikin-friendly foods that are better choices when away from home and review make-ahead meal and snack options. Calorie density will be reviewed and applied to three nutrition priorities: weight maintenance, weight loss, and weight gain. The purpose of this lesson is to reinforce (in a group setting) the key concepts around what patients are recommended to eat and how to apply these guidelines when away from home by planning and selecting Pritikin-friendly options. Patients will understand how calorie density may be adjusted for different weight management goals.  Mindful Eating  Clinical staff led group instruction and group discussion with PowerPoint presentation and patient guidebook. To enhance the learning environment the use of posters,  models and videos may be added. Patients will briefly review the concepts of the Pritikin Eating Plan and the importance of low-calorie dense foods. The concept of mindful eating will be introduced as well as the importance of paying attention to internal hunger signals. Triggers for non-hunger eating and  techniques for dealing with triggers will be explored. The purpose of this lesson is to provide patients with the opportunity to review the basic principles of the Pritikin Eating Plan, discuss the value of eating mindfully and how to measure internal cues of hunger and fullness using the Hunger Scale. Patients will also discuss reasons for non-hunger eating and learn strategies to use for controlling emotional eating.  Targeting Your Nutrition Priorities Clinical staff led group instruction and group discussion with PowerPoint presentation and patient guidebook. To enhance the learning environment the use of posters, models and videos may be added. Patients will learn how to determine their genetic susceptibility to disease by reviewing their family history. Patients will gain insight into the importance of diet as part of an overall healthy lifestyle in mitigating the impact of genetics and other environmental insults. The purpose of this lesson is to provide patients with the opportunity to assess their personal nutrition priorities by looking at their family history, their own health history and current risk factors. Patients will also be able to discuss ways of prioritizing and modifying the Pritikin Eating Plan for their highest risk areas  Menu  Clinical staff led group instruction and group discussion with PowerPoint presentation and patient guidebook. To enhance the learning environment the use of posters, models and videos may be added. Using menus brought in from [employer], or printed from [employer], patients will apply the Pritikin dining out guidelines that were presented in the  [employer] video. Patients will also be able to practice these guidelines in a variety of provided scenarios. The purpose of this lesson is to provide patients with the opportunity to practice hands-on learning of the Pritikin Dining Out guidelines with actual menus and practice scenarios.  Label Reading Clinical staff led group instruction and group discussion with PowerPoint presentation and patient guidebook. To enhance the learning environment the use of posters, models and videos may be added. Patients will review and discuss the Pritikin label reading guidelines presented in Pritikin's Label Reading Educational series video. Using fool labels brought in from local grocery stores and markets, patients will apply the label reading guidelines and determine if the packaged food meet the Pritikin guidelines. The purpose of this lesson is to provide patients with the opportunity to review, discuss, and practice hands-on learning of the Pritikin Label Reading guidelines with actual packaged food labels. Cooking School  Pritikin's [employer] are designed to teach patients ways to prepare quick, simple, and affordable recipes at home. The importance of nutrition's role in chronic disease risk reduction is reflected in its emphasis in the overall Pritikin program. By learning how to prepare essential core Pritikin Eating Plan recipes, patients will increase control over what they eat; be able to customize the flavor of foods without the use of added salt, sugar, or fat; and improve the quality of the food they consume. By learning a set of core recipes which are easily assembled, quickly prepared, and affordable, patients are more likely to prepare more healthy foods at home. These workshops focus on convenient breakfasts, simple entres, side dishes, and desserts which can be prepared with minimal effort and are consistent with nutrition recommendations for cardiovascular risk  reduction. Cooking [employer] are taught by a armed forces logistics/support/administrative officer (RD) who has been trained by the [employer]. The chef or RD has a clear understanding of the importance of minimizing - if not completely eliminating - added fat, sugar, and sodium in  recipes. Throughout the series of Cooking School Workshop sessions, patients will learn about healthy ingredients and efficient methods of cooking to build confidence in their capability to prepare    Cooking School weekly topics:  Adding Flavor- Sodium-Free  Fast and Healthy Breakfasts  Powerhouse Plant-Based Proteins  Satisfying Salads and Dressings  Simple Sides and Sauces  International Cuisine-Spotlight on the [employer] Zones  Delicious Desserts  Savory Soups  [employer] - Meals in a Astronomer Appetizers and Snacks  Comforting Weekend Breakfasts  One-Pot Wonders   Fast Evening Meals  Landscape Architect Your Pritikin Plate  WORKSHOPS   Healthy Mindset (Psychosocial):  Focused Goals, Sustainable Changes Clinical staff led group instruction and group discussion with PowerPoint presentation and patient guidebook. To enhance the learning environment the use of posters, models and videos may be added. Patients will be able to apply effective goal setting strategies to establish at least one personal goal, and then take consistent, meaningful action toward that goal. They will learn to identify common barriers to achieving personal goals and develop strategies to overcome them. Patients will also gain an understanding of how our mind-set can impact our ability to achieve goals and the importance of cultivating a positive and growth-oriented mind-set. The purpose of this lesson is to provide patients with a deeper understanding of how to set and achieve personal goals, as well as the tools and strategies needed to overcome common obstacles which may arise along the way.  From Head to Heart: The  Power of a Healthy Outlook  Clinical staff led group instruction and group discussion with PowerPoint presentation and patient guidebook. To enhance the learning environment the use of posters, models and videos may be added. Patients will be able to recognize and describe the impact of emotions and mood on physical health. They will discover the importance of self-care and explore self-care practices which may work for them. Patients will also learn how to utilize the 4 C's to cultivate a healthier outlook and better manage stress and challenges. The purpose of this lesson is to demonstrate to patients how a healthy outlook is an essential part of maintaining good health, especially as they continue their cardiac rehab journey.  Healthy Sleep for a Healthy Heart Clinical staff led group instruction and group discussion with PowerPoint presentation and patient guidebook. To enhance the learning environment the use of posters, models and videos may be added. At the conclusion of this workshop, patients will be able to demonstrate knowledge of the importance of sleep to overall health, well-being, and quality of life. They will understand the symptoms of, and treatments for, common sleep disorders. Patients will also be able to identify daytime and nighttime behaviors which impact sleep, and they will be able to apply these tools to help manage sleep-related challenges. The purpose of this lesson is to provide patients with a general overview of sleep and outline the importance of quality sleep. Patients will learn about a few of the most common sleep disorders. Patients will also be introduced to the concept of "sleep hygiene," and discover ways to self-manage certain sleeping problems through simple daily behavior changes. Finally, the workshop will motivate patients by clarifying the links between quality sleep and their goals of heart-healthy living.   Recognizing and Reducing Stress Clinical staff led  group instruction and group discussion with PowerPoint presentation and patient guidebook. To enhance the learning environment the use of posters, models and videos may be added. At the conclusion of this  workshop, patients will be able to understand the types of stress reactions, differentiate between acute and chronic stress, and recognize the impact that chronic stress has on their health. They will also be able to apply different coping mechanisms, such as reframing negative self-talk. Patients will have the opportunity to practice a variety of stress management techniques, such as deep abdominal breathing, progressive muscle relaxation, and/or guided imagery.  The purpose of this lesson is to educate patients on the role of stress in their lives and to provide healthy techniques for coping with it.  Learning Barriers/Preferences:  Learning Barriers/Preferences - 11/18/23 1022       Learning Barriers/Preferences   Learning Barriers Sight   wears glasses, HOH wears bilateral hearing aids   Learning Preferences Audio;Verbal Instruction;Computer/Internet;Video;Group Instruction;Written Material;Individual Instruction;Pictoral;Skilled Demonstration             Education Topics:  Knowledge Questionnaire Score:  Knowledge Questionnaire Score - 11/18/23 1206       Knowledge Questionnaire Score   Pre Score 22/24             Core Components/Risk Factors/Patient Goals at Admission:  Personal Goals and Risk Factors at Admission - 11/18/23 1031       Core Components/Risk Factors/Patient Goals on Admission   Diabetes Yes    Intervention Provide education about signs/symptoms and action to take for hypo/hyperglycemia.;Provide education about proper nutrition, including hydration, and aerobic/resistive exercise prescription along with prescribed medications to achieve blood glucose in normal ranges: Fasting glucose 65-99 mg/dL    Expected Outcomes Short Term: Participant verbalizes  understanding of the signs/symptoms and immediate care of hyper/hypoglycemia, proper foot care and importance of medication, aerobic/resistive exercise and nutrition plan for blood glucose control.;Long Term: Attainment of HbA1C < 7%.    Hypertension Yes    Intervention Provide education on lifestyle modifcations including regular physical activity/exercise, weight management, moderate sodium restriction and increased consumption of fresh fruit, vegetables, and low fat dairy, alcohol moderation, and smoking cessation.;Monitor prescription use compliance.    Expected Outcomes Short Term: Continued assessment and intervention until BP is < 140/35mm HG in hypertensive participants. < 130/89mm HG in hypertensive participants with diabetes, heart failure or chronic kidney disease.;Long Term: Maintenance of blood pressure at goal levels.    Lipids Yes    Intervention Provide education and support for participant on nutrition & aerobic/resistive exercise along with prescribed medications to achieve LDL 70mg , HDL >40mg .    Expected Outcomes Short Term: Participant states understanding of desired cholesterol values and is compliant with medications prescribed. Participant is following exercise prescription and nutrition guidelines.;Long Term: Cholesterol controlled with medications as prescribed, with individualized exercise RX and with personalized nutrition plan. Value goals: LDL < 70mg , HDL > 40 mg.             Core Components/Risk Factors/Patient Goals Review:   Goals and Risk Factor Review     Row Name 11/23/23 1345 11/29/23 1430           Core Components/Risk Factors/Patient Goals Review   Personal Goals Review Weight Management/Obesity;Lipids;Hypertension;Diabetes Weight Management/Obesity;Lipids;Hypertension;Diabetes      Review Kaytelyn started cardiac rehab on 11/22/23. Lyrick did well with exercise for her fitness level. Vital signs and CBg's were stable. Burna uses a cane for stability. Gabriel  started cardiac rehab on 11/22/23. Ja is off to a good start with exercise  Vital signs and CBg's have been stable.      Expected Outcomes Blanche will conitnue to participate in cardiac rehab for exercise, nutrition  and lifestyle modifications Prezley will conitnue to participate in cardiac rehab for exercise, nutrition and lifestyle modifications               Core Components/Risk Factors/Patient Goals at Discharge (Final Review):   Goals and Risk Factor Review - 11/29/23 1430       Core Components/Risk Factors/Patient Goals Review   Personal Goals Review Weight Management/Obesity;Lipids;Hypertension;Diabetes    Review Sherae started cardiac rehab on 11/22/23. Mertice is off to a good start with exercise  Vital signs and CBg's have been stable.    Expected Outcomes Brette will conitnue to participate in cardiac rehab for exercise, nutrition and lifestyle modifications             ITP Comments:  ITP Comments     Row Name 11/11/23 0923 11/23/23 1343 11/29/23 1428       ITP Comments Dr. Wilbert Bihari medical director. Introduction to pritikin education/ intensive cardiac rehab. Initial orientation packet reviewed with patient. 30 Day ITP Review. Julina started cardiac rehab on 11/22/23. Katilynn did well with exercise for her fitness elve 30 Day ITP Review. Lakeeta started cardiac rehab on 11/22/23. Scotti is off to a good start with exercise              Comments: See ITP comments.Hadassah Elpidio Quan RN BSN

## 2023-12-01 ENCOUNTER — Encounter (HOSPITAL_COMMUNITY)
Admission: RE | Admit: 2023-12-01 | Discharge: 2023-12-01 | Disposition: A | Discharge status: Home / Self Care | Payer: Medicare HMO | Source: Ambulatory Visit | Attending: Cardiovascular Disease | Admitting: Cardiovascular Disease

## 2023-12-01 DIAGNOSIS — Z952 Presence of prosthetic heart valve: Secondary | ICD-10-CM

## 2023-12-01 DIAGNOSIS — I4891 Unspecified atrial fibrillation: Secondary | ICD-10-CM | POA: Diagnosis not present

## 2023-12-03 ENCOUNTER — Encounter (HOSPITAL_COMMUNITY): Payer: Medicare HMO

## 2023-12-03 ENCOUNTER — Telehealth (HOSPITAL_COMMUNITY): Payer: Self-pay

## 2023-12-03 NOTE — Telephone Encounter (Addendum)
 Talked with patient, no classes offered today after 12:30 due to impending weather

## 2023-12-06 ENCOUNTER — Encounter (HOSPITAL_COMMUNITY)
Admission: RE | Admit: 2023-12-06 | Discharge: 2023-12-06 | Disposition: A | Discharge status: Home / Self Care | Payer: Medicare HMO | Source: Ambulatory Visit | Attending: Cardiovascular Disease

## 2023-12-06 DIAGNOSIS — I4891 Unspecified atrial fibrillation: Secondary | ICD-10-CM

## 2023-12-06 DIAGNOSIS — Z952 Presence of prosthetic heart valve: Secondary | ICD-10-CM

## 2023-12-08 ENCOUNTER — Encounter (HOSPITAL_COMMUNITY)
Admission: RE | Admit: 2023-12-08 | Discharge: 2023-12-08 | Disposition: A | Discharge status: Home / Self Care | Payer: Medicare HMO | Source: Ambulatory Visit | Attending: Cardiovascular Disease | Admitting: Cardiovascular Disease

## 2023-12-08 DIAGNOSIS — I4891 Unspecified atrial fibrillation: Secondary | ICD-10-CM | POA: Diagnosis not present

## 2023-12-08 DIAGNOSIS — Z952 Presence of prosthetic heart valve: Secondary | ICD-10-CM

## 2023-12-10 ENCOUNTER — Encounter (HOSPITAL_COMMUNITY)
Admission: RE | Admit: 2023-12-10 | Discharge: 2023-12-10 | Disposition: A | Discharge status: Home / Self Care | Payer: Medicare HMO | Source: Ambulatory Visit | Attending: Cardiovascular Disease | Admitting: Cardiovascular Disease

## 2023-12-10 DIAGNOSIS — Z952 Presence of prosthetic heart valve: Secondary | ICD-10-CM

## 2023-12-10 DIAGNOSIS — I4891 Unspecified atrial fibrillation: Secondary | ICD-10-CM | POA: Diagnosis not present

## 2023-12-13 ENCOUNTER — Encounter (HOSPITAL_COMMUNITY)
Admission: RE | Admit: 2023-12-13 | Discharge: 2023-12-13 | Disposition: A | Discharge status: Home / Self Care | Payer: Medicare HMO | Source: Ambulatory Visit | Attending: Cardiovascular Disease

## 2023-12-13 DIAGNOSIS — Z952 Presence of prosthetic heart valve: Secondary | ICD-10-CM

## 2023-12-13 DIAGNOSIS — I4891 Unspecified atrial fibrillation: Secondary | ICD-10-CM | POA: Diagnosis not present

## 2023-12-15 ENCOUNTER — Encounter (HOSPITAL_COMMUNITY): Payer: Medicare HMO

## 2023-12-17 ENCOUNTER — Encounter (HOSPITAL_COMMUNITY)
Admission: RE | Admit: 2023-12-17 | Discharge: 2023-12-17 | Disposition: A | Discharge status: Home / Self Care | Payer: Medicare HMO | Source: Ambulatory Visit | Attending: Cardiovascular Disease

## 2023-12-17 DIAGNOSIS — Z952 Presence of prosthetic heart valve: Secondary | ICD-10-CM

## 2023-12-17 DIAGNOSIS — I4891 Unspecified atrial fibrillation: Secondary | ICD-10-CM

## 2023-12-20 ENCOUNTER — Encounter (HOSPITAL_COMMUNITY)
Admission: RE | Admit: 2023-12-20 | Discharge: 2023-12-20 | Disposition: A | Discharge status: Home / Self Care | Payer: Medicare HMO | Source: Ambulatory Visit | Attending: Cardiovascular Disease | Admitting: Cardiovascular Disease

## 2023-12-20 DIAGNOSIS — I4891 Unspecified atrial fibrillation: Secondary | ICD-10-CM | POA: Diagnosis not present

## 2023-12-20 DIAGNOSIS — Z952 Presence of prosthetic heart valve: Secondary | ICD-10-CM

## 2023-12-22 ENCOUNTER — Encounter (HOSPITAL_COMMUNITY)
Admission: RE | Admit: 2023-12-22 | Discharge: 2023-12-22 | Disposition: A | Discharge status: Home / Self Care | Payer: Medicare HMO | Source: Ambulatory Visit | Attending: Cardiovascular Disease | Admitting: Cardiovascular Disease

## 2023-12-22 DIAGNOSIS — Z952 Presence of prosthetic heart valve: Secondary | ICD-10-CM

## 2023-12-22 DIAGNOSIS — I4891 Unspecified atrial fibrillation: Secondary | ICD-10-CM | POA: Diagnosis not present

## 2023-12-24 ENCOUNTER — Encounter (HOSPITAL_COMMUNITY)
Admission: RE | Admit: 2023-12-24 | Discharge: 2023-12-24 | Disposition: A | Discharge status: Home / Self Care | Payer: Medicare HMO | Source: Ambulatory Visit | Attending: Cardiovascular Disease

## 2023-12-24 DIAGNOSIS — I4891 Unspecified atrial fibrillation: Secondary | ICD-10-CM | POA: Diagnosis not present

## 2023-12-24 DIAGNOSIS — Z952 Presence of prosthetic heart valve: Secondary | ICD-10-CM

## 2023-12-24 NOTE — Progress Notes (Signed)
Cardiac Individual Treatment Plan  Patient Details  Name: Bonnie Chavez MRN: [id number] Date of Birth: 07/17/1934 Referring Provider:   Flowsheet Row INTENSIVE CARDIAC REHAB from 11/18/2023 in [facility]  Referring Provider Nicki Guadalajara, MD       Initial Encounter Date:  Flowsheet Row INTENSIVE CARDIAC REHAB from 11/18/2023 in [facility]  Date 11/18/23       Visit Diagnosis: 10/05/23 TAVR  Atrial fibrillation, unspecified type (HCC)  Patient's Home Medications on Admission:  Current Outpatient Medications:    acetaminophen (TYLENOL) 500 MG tablet, Take 500 mg by mouth every 6 (six) hours as needed (Arthritis)., Disp: , Rfl:    amLODipine (NORVASC) 5 MG tablet, Take 1 tablet (5 mg total) by mouth daily., Disp: 30 tablet, Rfl: 6   aspirin 81 MG EC tablet, Take 81 mg by mouth at bedtime. Swallow whole., Disp: , Rfl:    azithromycin (ZITHROMAX) 500 MG tablet, Take 1 tablet (500 mg total) 1 hour prior to dental appointments (Patient not taking: Reported on 11/25/2023), Disp: 3 tablet, Rfl: 3   BD PEN NEEDLE NANO 2ND GEN 32G X 4 MM MISC, USE AS DIRECTED 90, Disp: , Rfl:    cinacalcet (SENSIPAR) 30 MG tablet, Take 30 mg by mouth every Monday, Wednesday, and Friday., Disp: , Rfl:    furosemide (LASIX) 20 MG tablet, Take 40 mg by mouth every morning. Can take additional tablet if needed, Disp: 30 tablet, Rfl:    gabapentin (NEURONTIN) 100 MG capsule, Take 100 mg by mouth at bedtime., Disp: , Rfl:    HUMULIN 70/30 KWIKPEN (70-30) 100 UNIT/ML KwikPen, Inject 4-8 Units into the skin See admin instructions. 4 units in the AM and 8 units in the pm, Disp: , Rfl:    levothyroxine (SYNTHROID) 50 MCG tablet, Take 50 mcg by mouth daily before breakfast., Disp: , Rfl:    nebivolol (BYSTOLIC) 10 MG tablet, Take 1 tablet (10 mg total) by mouth daily., Disp: 30 tablet, Rfl: 3   OneTouch  Delica Lancets 33G MISC, Apply topically., Disp: , Rfl:    ONETOUCH VERIO test strip, 1 each 2 (two) times daily., Disp: , Rfl:    rosuvastatin (CRESTOR) 40 MG tablet, TAKE 1 TABLET BY MOUTH EVERYDAY AT BEDTIME, Disp: 90 tablet, Rfl: 1   Trolamine Salicylate (BLUE-EMU HEMP EX), Apply 1 application  topically daily as needed (pain)., Disp: , Rfl:   Past Medical History: Past Medical History:  Diagnosis Date   (HFpEF) heart failure with preserved ejection fraction (HCC)    Anemia    History of , resolved   Carpal tunnel syndrome    Chronic low back pain 02/27/2016   -DDD -seeing [facility]    CKD stage 3b, GFR 30-44 ml/min (HCC)    Diabetes mellitus    type 2, sees Dr. Talmage Nap   Goiter    Dr. Talmage Nap   History of blood transfusion    auto transfusion post knee replacement   Hyperlipidemia    Hyperparathyroidism (HCC) 02/27/2016   -sees endocrinologist, Dr. Talmage Nap, has seen surgeon as well    Hypertension    Migraine    no longer has migraines (80's) after menopause   Mitral valve prolapse    Neuropathy    Osteoarthritis    DDD, sees [place] [facility]   PMB (postmenopausal bleeding) 02/28/2009   Resolved   S/P TAVR (transcatheter aortic valve replacement) 10/05/2023  s/p TAVR with a 23 mm Edwards S3UR via the TF approach by Dr. Excell Seltzer and [facility]   Severe aortic stenosis    Vaginal atrophy    Vitamin D deficiency     Tobacco Use: Social History   Tobacco Use  Smoking Status Never  Smokeless Tobacco Never    Labs: Review Flowsheet  More data exists      Latest Ref Rng & Units 06/09/2018 12/27/2019 12/15/2021 01/05/2023 10/05/2023  Labs for ITP Cardiac and Pulmonary Rehab  Cholestrol 0 - 200 - - - 173     -  HDL-C 35 - 70 - - - 60     -  Trlycerides 40 - 160 - - - 77     -  Hemoglobin A1c - 7.3  7.8  7.3  7.6     -  TCO2 22 - 32 mmol/L - - - - 25     Details       This result is from an external source.         Capillary Blood Glucose: Lab Results   Component Value Date   GLUCAP 162 (H) 11/29/2023   GLUCAP 107 (H) 11/26/2023   GLUCAP 114 (H) 11/26/2023   GLUCAP 135 (H) 11/22/2023   GLUCAP 144 (H) 11/22/2023     Exercise Target Goals: Exercise Program Goal: Individual exercise prescription set using results from initial 6 min walk test and THRR while considering  patient's activity barriers and safety.   Exercise Prescription Goal: Initial exercise prescription builds to 30-45 minutes a day of aerobic activity, 2-3 days per week.  Home exercise guidelines will be given to patient during program as part of exercise prescription that the participant will acknowledge.  Activity Barriers & Risk Stratification:  Activity Barriers & Cardiac Risk Stratification - 11/18/23 1021       Activity Barriers & Cardiac Risk Stratification   Activity Barriers Left Knee Replacement;Right Knee Replacement;Neck/Spine Problems;Joint Problems;Deconditioning;Muscular Weakness;Back Problems;Arthritis    Cardiac Risk Stratification High             6 Minute Walk:  6 Minute Walk     Row Name 11/18/23 1200         6 Minute Walk   Phase Initial  Used Go-cart     Distance 970 feet     Walk Time 6 minutes     # of Rest Breaks 0     MPH 1.84     METS 1     RPE 11     Perceived Dyspnea  0     VO2 Peak 3.22     Symptoms No     Resting HR 65 bpm     Resting BP 140/64     Resting Oxygen Saturation  100 %     Exercise Oxygen Saturation  during 6 min walk 98 %     Max Ex. HR 76 bpm     Max Ex. BP 152/78     2 Minute Post BP 134/70              Oxygen Initial Assessment:   Oxygen Re-Evaluation:   Oxygen Discharge (Final Oxygen Re-Evaluation):   Initial Exercise Prescription:  Initial Exercise Prescription - 11/18/23 1200       Date of Initial Exercise RX and Referring Provider   Date 11/18/23    Referring Provider Nicki Guadalajara, MD    Expected Discharge Date 02/02/24      NuStep   Level 3  SPM 75    Minutes 25     METs 1      Prescription Details   Frequency (times per week) 3    Duration Progress to 30 minutes of continuous aerobic without signs/symptoms of physical distress      Intensity   THRR 40-80% of Max Heartrate 53-106    Ratings of Perceived Exertion 11-13    Perceived Dyspnea 0-4      Progression   Progression Continue progressive overload as per policy without signs/symptoms or physical distress.      Resistance Training   Training Prescription Yes    Weight 2 lbs    Reps 10-15             Perform Capillary Blood Glucose checks as needed.  Exercise Prescription Changes:   Exercise Prescription Changes     Row Name 11/22/23 1600 12/06/23 1500 12/22/23 1400         Response to Exercise   Blood Pressure (Admit) 142/64 142/68 130/62     Blood Pressure (Exercise) 152/60 152/64 --     Blood Pressure (Exit) 138/72 112/60 112/60     Heart Rate (Admit) 68 bpm 68 bpm 56 bpm     Heart Rate (Exercise) 95 bpm 76 bpm 93 bpm     Heart Rate (Exit) 77 bpm 74 bpm 60 bpm     Rating of Perceived Exertion (Exercise) 11 11 11      Symptoms None None None     Comments Pt's first day in the CRP2 program Reviewed METs Reviewed METs and goals     Duration Progress to 30 minutes of  aerobic without signs/symptoms of physical distress Continue with 30 min of aerobic exercise without signs/symptoms of physical distress. Continue with 30 min of aerobic exercise without signs/symptoms of physical distress.     Intensity THRR unchanged THRR unchanged THRR unchanged       Progression   Progression Continue to progress workloads to maintain intensity without signs/symptoms of physical distress. Continue to progress workloads to maintain intensity without signs/symptoms of physical distress. Continue to progress workloads to maintain intensity without signs/symptoms of physical distress.     Average METs 1.9 2.2 2.2       Resistance Training   Training Prescription Yes Yes No     Weight 2 lbs 2  lbs No weights on Wednesdays     Reps 10-15 10-15 --     Time 10 Minutes 10 Minutes --       Interval Training   Interval Training No No No       NuStep   Level 1 1 2      SPM 75 80 80     Minutes 25 30 30      METs 1.9 2.2 2.2              Exercise Comments:   Exercise Comments     Row Name 11/22/23 1624 12/06/23 1510 12/22/23 1435       Exercise Comments Pt's first day in the CRP2 program. Pt exercised today with no complaints. Reviewed METs. Pt is making slow, steady progress. Reviewed METs and goals. Pt increased workload on nustep today.              Exercise Goals and Review:   Exercise Goals     Row Name 11/11/23 0924             Exercise Goals   Increase Physical Activity Yes  Intervention Provide advice, education, support and counseling about physical activity/exercise needs.;Develop an individualized exercise prescription for aerobic and resistive training based on initial evaluation findings, risk stratification, comorbidities and participant's personal goals.       Expected Outcomes Short Term: Attend rehab on a regular basis to increase amount of physical activity.;Long Term: Exercising regularly at least 3-5 days a week.;Long Term: Add in home exercise to make exercise part of routine and to increase amount of physical activity.       Increase Strength and Stamina Yes       Intervention Provide advice, education, support and counseling about physical activity/exercise needs.;Develop an individualized exercise prescription for aerobic and resistive training based on initial evaluation findings, risk stratification, comorbidities and participant's personal goals.       Expected Outcomes Short Term: Increase workloads from initial exercise prescription for resistance, speed, and METs.;Short Term: Perform resistance training exercises routinely during rehab and add in resistance training at home;Long Term: Improve cardiorespiratory fitness, muscular  endurance and strength as measured by increased METs and functional capacity ( )       Able to understand and use rate of perceived exertion (RPE) scale Yes       Intervention Provide education and explanation on how to use RPE scale       Expected Outcomes Short Term: Able to use RPE daily in rehab to express subjective intensity level;Long Term:  Able to use RPE to guide intensity level when exercising independently       Knowledge and understanding of Target Heart Rate Range (THRR) Yes       Intervention Provide education and explanation of THRR including how the numbers were predicted and where they are located for reference       Expected Outcomes Short Term: Able to state/look up THRR;Short Term: Able to use daily as guideline for intensity in rehab;Long Term: Able to use THRR to govern intensity when exercising independently       Understanding of Exercise Prescription Yes       Intervention Provide education, explanation, and written materials on patient's individual exercise prescription       Expected Outcomes Short Term: Able to explain program exercise prescription;Long Term: Able to explain home exercise prescription to exercise independently                Exercise Goals Re-Evaluation :  Exercise Goals Re-Evaluation     Row Name 11/22/23 1623 12/22/23 1435           Exercise Goal Re-Evaluation   Exercise Goals Review Increase Physical Activity;Increase Strength and Stamina;Able to understand and use rate of perceived exertion (RPE) scale;Knowledge and understanding of Target Heart Rate Range (THRR);Understanding of Exercise Prescription Increase Physical Activity;Increase Strength and Stamina;Able to understand and use rate of perceived exertion (RPE) scale;Knowledge and understanding of Target Heart Rate Range (THRR);Understanding of Exercise Prescription      Comments Pt's first day in the CRP2 program. Pt understnads the Exercise Rx, RPE scale, and THRR. Reviewed METs  and goals today. Pt voices progress on her goals of imrpoved strength and stamina. Pt reports at home that she is able to stand at the kitchen sink for a longer period of time. She also voices that she is napping less.      Expected Outcomes Will continue to montior and progress exercise workloads as tolerated. Will continue to montior and progress exercise workloads as tolerated.  Discharge Exercise Prescription (Final Exercise Prescription Changes):  Exercise Prescription Changes - 12/22/23 1400       Response to Exercise   Blood Pressure (Admit) 130/62    Blood Pressure (Exit) 112/60    Heart Rate (Admit) 56 bpm    Heart Rate (Exercise) 93 bpm    Heart Rate (Exit) 60 bpm    Rating of Perceived Exertion (Exercise) 11    Symptoms None    Comments Reviewed METs and goals    Duration Continue with 30 min of aerobic exercise without signs/symptoms of physical distress.    Intensity THRR unchanged      Progression   Progression Continue to progress workloads to maintain intensity without signs/symptoms of physical distress.    Average METs 2.2      Resistance Training   Training Prescription No    Weight No weights on Wednesdays      Interval Training   Interval Training No      NuStep   Level 2    SPM 80    Minutes 30    METs 2.2             Nutrition:  Target Goals: Understanding of nutrition guidelines, daily intake of sodium 1500mg , cholesterol 200mg , calories 30% from fat and 7% or less from saturated fats, daily to have 5 or more servings of fruits and vegetables.  Biometrics:  Pre Biometrics - 11/18/23 1015       Pre Biometrics   Waist Circumference 44.5 inches    Hip Circumference 46.25 inches    Waist to Hip Ratio 0.96 %    Triceps Skinfold 45 mm    % Body Fat 49.5 %    Grip Strength 13 kg    Flexibility --   No performed due to back issues   Single Leg Stand 1.2 seconds              Nutrition Therapy Plan and Nutrition  Goals:   Nutrition Assessments:  MEDIFICTS Score Key: >=70 Need to make dietary changes  40-70 Heart Healthy Diet <= 40 Therapeutic Level Cholesterol Diet   Flowsheet Row INTENSIVE CARDIAC REHAB from 11/18/2023 in [facility]  Picture Your Plate Total Score on Admission 73      Picture Your Plate Scores: <29 Unhealthy dietary pattern with much room for improvement. 41-50 Dietary pattern unlikely to meet recommendations for good health and room for improvement. 51-60 More healthful dietary pattern, with some room for improvement.  >60 Healthy dietary pattern, although there may be some specific behaviors that could be improved.    Nutrition Goals Re-Evaluation:   Nutrition Goals Re-Evaluation:   Nutrition Goals Discharge (Final Nutrition Goals Re-Evaluation):   Psychosocial: Target Goals: Acknowledge presence or absence of significant depression and/or stress, maximize coping skills, provide positive support system. Participant is able to verbalize types and ability to use techniques and skills needed for reducing stress and depression.  Initial Review & Psychosocial Screening:  Initial Psych Review & Screening - 11/11/23 1150       Initial Review   Current issues with None Identified      Family Dynamics   Good Support System? Yes   Bonnie Chavez has her two sons, sister, sister in law and nieces for support     Barriers   Psychosocial barriers to participate in program There are no identifiable barriers or psychosocial needs.      Screening Interventions   Interventions Encouraged  to exercise             Quality of Life Scores:  Quality of Life - 11/18/23 1205       Quality of Life   Select Quality of Life      Quality of Life Scores   Health/Function Pre 25.47 %    Socioeconomic Pre 25.79 %    Psych/Spiritual Pre 28.29 %    Family Pre 26.3 %    GLOBAL Pre 26.24 %            Scores of 19 and  below usually indicate a poorer quality of life in these areas.  A difference of  2-3 points is a clinically meaningful difference.  A difference of 2-3 points in the total score of the Quality of Life Index has been associated with significant improvement in overall quality of life, self-image, physical symptoms, and general health in studies assessing change in quality of life.  PHQ-9: Review Flowsheet  More data exists      11/11/2023 09/08/2023 02/05/2023 10/07/2022 12/15/2021  Depression screen PHQ 2/9  Decreased Interest 0 0 0 0 0  Down, Depressed, Hopeless 0 0 0 0 0  PHQ - 2 Score 0 0 0 0 0  Altered sleeping 0 0 0 - -  Tired, decreased energy 0 3 3 - -  Change in appetite 0 0 0 - -  Feeling bad or failure about yourself  0 0 0 - -  Trouble concentrating 0 0 0 - -  Moving slowly or fidgety/restless 0 0 0 - -  Suicidal thoughts 0 0 0 - -  PHQ-9 Score 0 3 3 - -  Difficult doing work/chores Not difficult at all Somewhat difficult Somewhat difficult - -   Interpretation of Total Score  Total Score Depression Severity:  1-4 = Minimal depression, 5-9 = Mild depression, 10-14 = Moderate depression, 15-19 = Moderately severe depression, 20-27 = Severe depression   Psychosocial Evaluation and Intervention:   Psychosocial Re-Evaluation:  Psychosocial Re-Evaluation     Row Name 11/23/23 1344 11/29/23 1429 12/24/23 1055         Psychosocial Re-Evaluation   Current issues with None Identified None Identified None Identified     Interventions Encouraged to attend Cardiac Rehabilitation for the exercise Encouraged to attend Cardiac Rehabilitation for the exercise Encouraged to attend Cardiac Rehabilitation for the exercise     Continue Psychosocial Services  No Follow up required No Follow up required No Follow up required              Psychosocial Discharge (Final Psychosocial Re-Evaluation):  Psychosocial Re-Evaluation - 12/24/23 1055       Psychosocial Re-Evaluation    Current issues with None Identified    Interventions Encouraged to attend Cardiac Rehabilitation for the exercise    Continue Psychosocial Services  No Follow up required             Vocational Rehabilitation: Provide vocational rehab assistance to qualifying candidates.   Vocational Rehab Evaluation & Intervention:  Vocational Rehab - 11/11/23 1150       Initial Vocational Rehab Evaluation & Intervention   Assessment shows need for Vocational Rehabilitation No   Bonnie Chavez is retired and does not need vocational rehab at this time.            Education: Education Goals: Education classes will be provided on a weekly basis, covering required topics. Participant will state understanding/return demonstration of topics presented.    Education  Row Name 11/22/23 1600     Education   Cardiac Education Topics Pritikin   Geographical information systems officer Psychosocial   Psychosocial Workshop Focused Goals, Sustainable Changes   Instruction Review Code 1- Verbalizes Understanding   Class Start Time 1400   Class Stop Time 1440   Class Time Calculation (min) 40 min    Row Name 11/26/23 1400     Education   Cardiac Education Topics Pritikin   Licensed conveyancer Nutrition   Nutrition Dining Out - Part 1   Instruction Review Code 1- Verbalizes Understanding   Class Start Time 1358   Class Stop Time 1442   Class Time Calculation (min) 44 min    Row Name 11/29/23 1400     Education   Cardiac Education Topics Pritikin   Psychologist, forensic Exercise Education   Exercise Education Biomechanial Limitations   Instruction Review Code 1- Verbalizes Understanding   Class Start Time 1400   Class Stop Time 1440   Class Time Calculation (min) 40 min    Row Name 12/01/23 1500     Education   Cardiac Education Topics Pritikin    Customer service manager   Weekly Topic Comforting Weekend Breakfasts   Instruction Review Code 1- Verbalizes Understanding   Class Start Time 1400   Class Stop Time 1440   Class Time Calculation (min) 40 min    Row Name 12/08/23 1600     Education   Cardiac Education Topics Pritikin   Customer service manager   Weekly Topic Fast Evening Meals   Instruction Review Code 1- Verbalizes Understanding   Class Start Time 1400   Class Stop Time 1441   Class Time Calculation (min) 41 min    Row Name 12/10/23 1400     Education   Cardiac Education Topics Pritikin   Glass blower/designer Nutrition   Nutrition Workshop Fueling a Forensic psychologist   Instruction Review Code 1- Tax inspector   Class Start Time 1400   Class Stop Time 1432   Class Time Calculation (min) 32 min    Row Name 12/13/23 1500     Education   Cardiac Education Topics Pritikin   Geographical information systems officer Psychosocial   Psychosocial Workshop Healthy Sleep for a Healthy Heart   Instruction Review Code 1- Verbalizes Understanding   Class Start Time 1400   Class Stop Time 1445   Class Time Calculation (min) 45 min    Row Name 12/17/23 1400     Education   Cardiac Education Topics Pritikin   Nurse, children's Exercise Physiologist   Select Psychosocial   Psychosocial How Our Thoughts Can Heal Our Hearts   Instruction Review Code 1- Verbalizes Understanding   Class Start Time 1358   Class Stop Time 1435   Class Time Calculation (min) 37 min    Row Name 12/20/23 1500     Education   Cardiac Education Topics Pritikin   [employer]  Educator Exercise Physiologist   Select Exercise   Exercise Workshop Managing Heart Disease: Your Path to a Healthier Heart   Instruction Review  Code 1- Verbalizes Understanding   Class Start Time 1355   Class Stop Time 1445   Class Time Calculation (min) 50 min    Row Name 12/22/23 1500     Education   Cardiac Education Topics Pritikin   Customer service manager   Weekly Topic Simple Sides and Sauces   Instruction Review Code 1- Verbalizes Understanding   Class Start Time 1145   Class Stop Time 1226   Class Time Calculation (min) 41 min            Core Videos: Exercise    Move It!  Clinical staff conducted group or individual video education with verbal and written material and guidebook.  Patient learns the recommended Pritikin exercise program. Exercise with the goal of living a long, healthy life. Some of the health benefits of exercise include controlled diabetes, healthier blood pressure levels, improved cholesterol levels, improved heart and lung capacity, improved sleep, and better body composition. Everyone should speak with their doctor before starting or changing an exercise routine.  Biomechanical Limitations Clinical staff conducted group or individual video education with verbal and written material and guidebook.  Patient learns how biomechanical limitations can impact exercise and how we can mitigate and possibly overcome limitations to have an impactful and balanced exercise routine.  Body Composition Clinical staff conducted group or individual video education with verbal and written material and guidebook.  Patient learns that body composition (ratio of muscle mass to fat mass) is a key component to assessing overall fitness, rather than body weight alone. Increased fat mass, especially visceral belly fat, can put [place] at increased risk for metabolic syndrome, type 2 diabetes, heart disease, and even death. It is recommended to combine diet and exercise (cardiovascular and resistance training) to improve your body composition. Seek guidance from your physician and  exercise physiologist before implementing an exercise routine.  Exercise Action Plan Clinical staff conducted group or individual video education with verbal and written material and guidebook.  Patient learns the recommended strategies to achieve and enjoy long-term exercise adherence, including variety, self-motivation, self-efficacy, and positive decision making. Benefits of exercise include fitness, good health, weight management, more energy, better sleep, less stress, and overall well-being.  Medical   Heart Disease Risk Reduction Clinical staff conducted group or individual video education with verbal and written material and guidebook.  Patient learns our heart is our most vital organ as it circulates oxygen, nutrients, white blood cells, and hormones throughout the entire body, and carries waste away. Data supports a plant-based eating plan like the Pritikin Program for its effectiveness in slowing progression of and reversing heart disease. The video provides a number of recommendations to address heart disease.   Metabolic Syndrome and Belly Fat  Clinical staff conducted group or individual video education with verbal and written material and guidebook.  Patient learns what metabolic syndrome is, how it leads to heart disease, and how one can reverse it and keep it from coming back. You have metabolic syndrome if you have 3 of the following 5 criteria: abdominal obesity, high blood pressure, high triglycerides, low HDL cholesterol, and high blood sugar.  Hypertension and Heart Disease Clinical staff conducted group or individual video education with verbal and written material and guidebook.  Patient learns that high blood pressure, or hypertension,  is very common in the [place]. Hypertension is largely due to excessive salt intake, but other important risk factors include being overweight, physical inactivity, drinking too much alcohol, smoking, and not eating enough potassium from  fruits and vegetables. High blood pressure is a leading risk factor for heart attack, stroke, congestive heart failure, dementia, kidney failure, and premature death. Long-term effects of excessive salt intake include stiffening of the arteries and thickening of heart muscle and organ damage. Recommendations include ways to reduce hypertension and the risk of heart disease.  Diseases of Our Time - Focusing on Diabetes Clinical staff conducted group or individual video education with verbal and written material and guidebook.  Patient learns why the best way to stop diseases of our time is prevention, through food and other lifestyle changes. Medicine (such as prescription pills and surgeries) is often only a Band-Aid on the problem, not a long-term solution. Most common diseases of our time include obesity, type 2 diabetes, hypertension, heart disease, and cancer. The Pritikin Program is recommended and has been proven to help reduce, reverse, and/or prevent the damaging effects of metabolic syndrome.  Nutrition   Overview of the Pritikin Eating Plan  Clinical staff conducted group or individual video education with verbal and written material and guidebook.  Patient learns about the Pritikin Eating Plan for disease risk reduction. The Pritikin Eating Plan emphasizes a wide variety of unrefined, minimally-processed carbohydrates, like fruits, vegetables, whole grains, and legumes. Go, Caution, and Stop food choices are explained. Plant-based and lean animal proteins are emphasized. Rationale provided for low sodium intake for blood pressure control, low added sugars for blood sugar stabilization, and low added fats and oils for coronary artery disease risk reduction and weight management.  Calorie Density  Clinical staff conducted group or individual video education with verbal and written material and guidebook.  Patient learns about calorie density and how it impacts the Pritikin Eating Plan. Knowing  the characteristics of the food you choose will help you decide whether those foods will lead to weight gain or weight loss, and whether you want to consume more or less of them. Weight loss is usually a side effect of the Pritikin Eating Plan because of its focus on low calorie-dense foods.  Label Reading  Clinical staff conducted group or individual video education with verbal and written material and guidebook.  Patient learns about the Pritikin recommended label reading guidelines and corresponding recommendations regarding calorie density, added sugars, sodium content, and whole grains.  Dining Out - Part 1  Clinical staff conducted group or individual video education with verbal and written material and guidebook.  Patient learns that restaurant meals can be sabotaging because they can be so high in calories, fat, sodium, and/or sugar. Patient learns recommended strategies on how to positively address this and avoid unhealthy pitfalls.  Facts on Fats  Clinical staff conducted group or individual video education with verbal and written material and guidebook.  Patient learns that lifestyle modifications can be just as effective, if not more so, as many medications for lowering your risk of heart disease. A Pritikin lifestyle can help to reduce your risk of inflammation and atherosclerosis (cholesterol build-up, or plaque, in the artery walls). Lifestyle interventions such as dietary choices and physical activity address the cause of atherosclerosis. A review of the types of fats and their impact on blood cholesterol levels, along with dietary recommendations to reduce fat intake is also included.  Nutrition Action Plan  Clinical staff conducted group or individual  video education with verbal and written material and guidebook.  Patient learns how to incorporate Pritikin recommendations into their lifestyle. Recommendations include planning and keeping personal health goals in mind as an  important part of their success.  Healthy Mind-Set    Healthy Minds, Bodies, Hearts  Clinical staff conducted group or individual video education with verbal and written material and guidebook.  Patient learns how to identify when they are stressed. Video will discuss the impact of that stress, as well as the many benefits of stress management. Patient will also be introduced to stress management techniques. The way we think, act, and feel has an impact on our hearts.  How Our Thoughts Can Heal Our Hearts  Clinical staff conducted group or individual video education with verbal and written material and guidebook.  Patient learns that negative thoughts can cause depression and anxiety. This can result in negative lifestyle behavior and serious health problems. Cognitive behavioral therapy is an effective method to help control our thoughts in order to change and improve our emotional outlook.  Additional Videos:  Exercise    Improving Performance  Clinical staff conducted group or individual video education with verbal and written material and guidebook.  Patient learns to use a non-linear approach by alternating intensity levels and lengths of time spent exercising to help burn more calories and lose more body fat. Cardiovascular exercise helps improve heart health, metabolism, hormonal balance, blood sugar control, and recovery from fatigue. Resistance training improves strength, endurance, balance, coordination, reaction time, metabolism, and muscle mass. Flexibility exercise improves circulation, posture, and balance. Seek guidance from your physician and exercise physiologist before implementing an exercise routine and learn your capabilities and proper form for all exercise.  Introduction to Yoga  Clinical staff conducted group or individual video education with verbal and written material and guidebook.  Patient learns about yoga, a discipline of the coming together of mind, breath, and  body. The benefits of yoga include improved flexibility, improved range of motion, better posture and core strength, increased lung function, weight loss, and positive self-image. Yoga's heart health benefits include lowered blood pressure, healthier heart rate, decreased cholesterol and triglyceride levels, improved immune function, and reduced stress. Seek guidance from your physician and exercise physiologist before implementing an exercise routine and learn your capabilities and proper form for all exercise.  Medical   Aging: Enhancing Your Quality of Life  Clinical staff conducted group or individual video education with verbal and written material and guidebook.  Patient learns key strategies and recommendations to stay in good physical health and enhance quality of life, such as prevention strategies, having an advocate, securing a Health Care Proxy and Power of Attorney, and keeping a list of medications and system for tracking them. It also discusses how to avoid risk for bone loss.  Biology of Weight Control  Clinical staff conducted group or individual video education with verbal and written material and guidebook.  Patient learns that weight gain occurs because we consume more calories than we burn (eating more, moving less). Even if your body weight is normal, you may have higher ratios of fat compared to muscle mass. Too much body fat puts you at increased risk for cardiovascular disease, heart attack, stroke, type 2 diabetes, and obesity-related cancers. In addition to exercise, following the Pritikin Eating Plan can help reduce your risk.  Decoding Lab Results  Clinical staff conducted group or individual video education with verbal and written material and guidebook.  Patient learns that lab test reflects  one measurement whose values change over time and are influenced by many factors, including medication, stress, sleep, exercise, food, hydration, pre-existing medical conditions, and  more. It is recommended to use the knowledge from this video to become more involved with your lab results and evaluate your numbers to speak with your doctor.   Diseases of Our Time - Overview  Clinical staff conducted group or individual video education with verbal and written material and guidebook.  Patient learns that according to the CDC, 50% to 70% of chronic diseases (such as obesity, type 2 diabetes, elevated lipids, hypertension, and heart disease) are avoidable through lifestyle improvements including healthier food choices, listening to satiety cues, and increased physical activity.  Sleep Disorders Clinical staff conducted group or individual video education with verbal and written material and guidebook.  Patient learns how good quality and duration of sleep are important to overall health and well-being. Patient also learns about sleep disorders and how they impact health along with recommendations to address them, including discussing with a physician.  Nutrition  Dining Out - Part 2 Clinical staff conducted group or individual video education with verbal and written material and guidebook.  Patient learns how to plan ahead and communicate in order to maximize their dining experience in a healthy and nutritious manner. Included are recommended food choices based on the type of restaurant the patient is visiting.   Fueling a Banker conducted group or individual video education with verbal and written material and guidebook.  There is a strong connection between our food choices and our health. Diseases like obesity and type 2 diabetes are very prevalent and are in large-part due to lifestyle choices. The Pritikin Eating Plan provides plenty of food and hunger-curbing satisfaction. It is easy to follow, affordable, and helps reduce health risks.  Menu Workshop  Clinical staff conducted group or individual video education with verbal and written material and  guidebook.  Patient learns that restaurant meals can sabotage health goals because they are often packed with calories, fat, sodium, and sugar. Recommendations include strategies to plan ahead and to communicate with the manager, chef, or server to help order a healthier meal.  Planning Your Eating Strategy  Clinical staff conducted group or individual video education with verbal and written material and guidebook.  Patient learns about the Pritikin Eating Plan and its benefit of reducing the risk of disease. The Pritikin Eating Plan does not focus on calories. Instead, it emphasizes high-quality, nutrient-rich foods. By knowing the characteristics of the foods, we choose, we can determine their calorie density and make informed decisions.  Targeting Your Nutrition Priorities  Clinical staff conducted group or individual video education with verbal and written material and guidebook.  Patient learns that lifestyle habits have a tremendous impact on disease risk and progression. This video provides eating and physical activity recommendations based on your personal health goals, such as reducing LDL cholesterol, losing weight, preventing or controlling type 2 diabetes, and reducing high blood pressure.  Vitamins and Minerals  Clinical staff conducted group or individual video education with verbal and written material and guidebook.  Patient learns different ways to obtain key vitamins and minerals, including through a recommended healthy diet. It is important to discuss all supplements you take with your doctor.   Healthy Mind-Set    Smoking Cessation  Clinical staff conducted group or individual video education with verbal and written material and guidebook.  Patient learns that cigarette smoking and tobacco addiction pose a serious  health risk which affects millions of people. Stopping smoking will significantly reduce the risk of heart disease, lung disease, and many forms of cancer.  Recommended strategies for quitting are covered, including working with your doctor to develop a successful plan.  Culinary   Becoming a Set designer conducted group or individual video education with verbal and written material and guidebook.  Patient learns that cooking at home can be healthy, cost-effective, quick, and puts them in control. Keys to cooking healthy recipes will include looking at your recipe, assessing your equipment needs, planning ahead, making it simple, choosing cost-effective seasonal ingredients, and limiting the use of added fats, salts, and sugars.  Cooking - Breakfast and Snacks  Clinical staff conducted group or individual video education with verbal and written material and guidebook.  Patient learns how important breakfast is to satiety and nutrition through the entire day. Recommendations include key foods to eat during breakfast to help stabilize blood sugar levels and to prevent overeating at meals later in the day. Planning ahead is also a key component.  Cooking - Educational psychologist conducted group or individual video education with verbal and written material and guidebook.  Patient learns eating strategies to improve overall health, including an approach to cook more at home. Recommendations include thinking of animal protein as a side on your plate rather than center stage and focusing instead on lower calorie dense options like vegetables, fruits, whole grains, and plant-based proteins, such as beans. Making sauces in large quantities to freeze for later and leaving the skin on your vegetables are also recommended to maximize your experience.  Cooking - Healthy Salads and Dressing Clinical staff conducted group or individual video education with verbal and written material and guidebook.  Patient learns that vegetables, fruits, whole grains, and legumes are the foundations of the Pritikin Eating Plan. Recommendations include how  to incorporate each of these in flavorful and healthy salads, and how to create homemade salad dressings. Proper handling of ingredients is also covered. Cooking - Soups and [employer] - Soups and Desserts Clinical staff conducted group or individual video education with verbal and written material and guidebook.  Patient learns that Pritikin soups and desserts make for easy, nutritious, and delicious snacks and meal components that are low in sodium, fat, sugar, and calorie density, while high in vitamins, minerals, and filling fiber. Recommendations include simple and healthy ideas for soups and desserts.   Overview     The Pritikin Solution Program Overview Clinical staff conducted group or individual video education with verbal and written material and guidebook.  Patient learns that the results of the Pritikin Program have been documented in more than 100 articles published in peer-reviewed journals, and the benefits include reducing risk factors for (and, in some cases, even reversing) high cholesterol, high blood pressure, type 2 diabetes, obesity, and more! An overview of the three key pillars of the Pritikin Program will be covered: eating well, doing regular exercise, and having a healthy mind-set.  WORKSHOPS  Exercise: Exercise Basics: Building Your Action Plan Clinical staff led group instruction and group discussion with PowerPoint presentation and patient guidebook. To enhance the learning environment the use of posters, models and videos may be added. At the conclusion of this workshop, patients will comprehend the difference between physical activity and exercise, as well as the benefits of incorporating both, into their routine. Patients will understand the FITT (Frequency, Intensity, Time, and Type) principle and how to use  it to build an exercise action plan. In addition, safety concerns and other considerations for exercise and cardiac rehab will be addressed by the  presenter. The purpose of this lesson is to promote a comprehensive and effective weekly exercise routine in order to improve patients' overall level of fitness.   Managing Heart Disease: Your Path to a Healthier Heart Clinical staff led group instruction and group discussion with PowerPoint presentation and patient guidebook. To enhance the learning environment the use of posters, models and videos may be added.At the conclusion of this workshop, patients will understand the anatomy and physiology of the heart. Additionally, they will understand how Pritikin's three pillars impact the risk factors, the progression, and the management of heart disease.  The purpose of this lesson is to provide a high-level overview of the heart, heart disease, and how the Pritikin lifestyle positively impacts risk factors.  Exercise Biomechanics Clinical staff led group instruction and group discussion with PowerPoint presentation and patient guidebook. To enhance the learning environment the use of posters, models and videos may be added. Patients will learn how the structural parts of their bodies function and how these functions impact their daily activities, movement, and exercise. Patients will learn how to promote a neutral spine, learn how to manage pain, and identify ways to improve their physical movement in order to promote healthy living. The purpose of this lesson is to expose patients to common physical limitations that impact physical activity. Participants will learn practical ways to adapt and manage aches and pains, and to minimize their effect on regular exercise. Patients will learn how to maintain good posture while sitting, walking, and lifting.  Balance Training and Fall Prevention  Clinical staff led group instruction and group discussion with PowerPoint presentation and patient guidebook. To enhance the learning environment the use of posters, models and videos may be added. At the  conclusion of this workshop, patients will understand the importance of their sensorimotor skills (vision, proprioception, and the vestibular system) in maintaining their ability to balance as they age. Patients will apply a variety of balancing exercises that are appropriate for their current level of function. Patients will understand the common causes for poor balance, possible solutions to these problems, and ways to modify their physical environment in order to minimize their fall risk. The purpose of this lesson is to teach patients about the importance of maintaining balance as they age and ways to minimize their risk of falling.  WORKSHOPS   Nutrition:  Fueling a Ship broker led group instruction and group discussion with PowerPoint presentation and patient guidebook. To enhance the learning environment the use of posters, models and videos may be added. Patients will review the foundational principles of the Pritikin Eating Plan and understand what constitutes a serving size in each of the food groups. Patients will also learn Pritikin-friendly foods that are better choices when away from home and review make-ahead meal and snack options. Calorie density will be reviewed and applied to three nutrition priorities: weight maintenance, weight loss, and weight gain. The purpose of this lesson is to reinforce (in a group setting) the key concepts around what patients are recommended to eat and how to apply these guidelines when away from home by planning and selecting Pritikin-friendly options. Patients will understand how calorie density may be adjusted for different weight management goals.  Mindful Eating  Clinical staff led group instruction and group discussion with PowerPoint presentation and patient guidebook. To enhance the learning environment the use  of posters, models and videos may be added. Patients will briefly review the concepts of the Pritikin Eating Plan and the  importance of low-calorie dense foods. The concept of mindful eating will be introduced as well as the importance of paying attention to internal hunger signals. Triggers for non-hunger eating and techniques for dealing with triggers will be explored. The purpose of this lesson is to provide patients with the opportunity to review the basic principles of the Pritikin Eating Plan, discuss the value of eating mindfully and how to measure internal cues of hunger and fullness using the Hunger Scale. Patients will also discuss reasons for non-hunger eating and learn strategies to use for controlling emotional eating.  Targeting Your Nutrition Priorities Clinical staff led group instruction and group discussion with PowerPoint presentation and patient guidebook. To enhance the learning environment the use of posters, models and videos may be added. Patients will learn how to determine their genetic susceptibility to disease by reviewing their family history. Patients will gain insight into the importance of diet as part of an overall healthy lifestyle in mitigating the impact of genetics and other environmental insults. The purpose of this lesson is to provide patients with the opportunity to assess their personal nutrition priorities by looking at their family history, their own health history and current risk factors. Patients will also be able to discuss ways of prioritizing and modifying the Pritikin Eating Plan for their highest risk areas  Menu  Clinical staff led group instruction and group discussion with PowerPoint presentation and patient guidebook. To enhance the learning environment the use of posters, models and videos may be added. Using menus brought in from [employer], or printed from [employer], patients will apply the Pritikin dining out guidelines that were presented in the [employer] video. Patients will also be able to practice these guidelines in a variety of  provided scenarios. The purpose of this lesson is to provide patients with the opportunity to practice hands-on learning of the Pritikin Dining Out guidelines with actual menus and practice scenarios.  Label Reading Clinical staff led group instruction and group discussion with PowerPoint presentation and patient guidebook. To enhance the learning environment the use of posters, models and videos may be added. Patients will review and discuss the Pritikin label reading guidelines presented in Pritikin's Label Reading Educational series video. Using fool labels brought in from local grocery stores and markets, patients will apply the label reading guidelines and determine if the packaged food meet the Pritikin guidelines. The purpose of this lesson is to provide patients with the opportunity to review, discuss, and practice hands-on learning of the Pritikin Label Reading guidelines with actual packaged food labels. Cooking School  Pritikin's [employer] are designed to teach patients ways to prepare quick, simple, and affordable recipes at home. The importance of nutrition's role in chronic disease risk reduction is reflected in its emphasis in the overall Pritikin program. By learning how to prepare essential core Pritikin Eating Plan recipes, patients will increase control over what they eat; be able to customize the flavor of foods without the use of added salt, sugar, or fat; and improve the quality of the food they consume. By learning a set of core recipes which are easily assembled, quickly prepared, and affordable, patients are more likely to prepare more healthy foods at home. These workshops focus on convenient breakfasts, simple entres, side dishes, and desserts which can be prepared with minimal effort and are consistent with nutrition recommendations  for cardiovascular risk reduction. Cooking [employer] are taught by a Armed forces logistics/support/administrative officer (RD) who has been trained by the  [employer]. The chef or RD has a clear understanding of the importance of minimizing - if not completely eliminating - added fat, sugar, and sodium in recipes. Throughout the series of Cooking School Workshop sessions, patients will learn about healthy ingredients and efficient methods of cooking to build confidence in their capability to prepare    Cooking School weekly topics:  Adding Flavor- Sodium-Free  Fast and Healthy Breakfasts  Powerhouse Plant-Based Proteins  Satisfying Salads and Dressings  Simple Sides and Sauces  [employer] Zones  Delicious Desserts  Savory Soups  [employer] - Meals in a Astronomer Appetizers and Snacks  Comforting Weekend Breakfasts  One-Pot Wonders   Fast Evening Meals  Landscape architect Your Pritikin Plate  WORKSHOPS   Healthy Mindset (Psychosocial):  Focused Goals, Sustainable Changes Clinical staff led group instruction and group discussion with PowerPoint presentation and patient guidebook. To enhance the learning environment the use of posters, models and videos may be added. Patients will be able to apply effective goal setting strategies to establish at least one personal goal, and then take consistent, meaningful action toward that goal. They will learn to identify common barriers to achieving personal goals and develop strategies to overcome them. Patients will also gain an understanding of how our mind-set can impact our ability to achieve goals and the importance of cultivating a positive and growth-oriented mind-set. The purpose of this lesson is to provide patients with a deeper understanding of how to set and achieve personal goals, as well as the tools and strategies needed to overcome common obstacles which may arise along the way.  From Head to Heart: The Power of a Healthy Outlook  Clinical staff led group instruction and group discussion with PowerPoint presentation  and patient guidebook. To enhance the learning environment the use of posters, models and videos may be added. Patients will be able to recognize and describe the impact of emotions and mood on physical health. They will discover the importance of self-care and explore self-care practices which may work for them. Patients will also learn how to utilize the 4 C's to cultivate a healthier outlook and better manage stress and challenges. The purpose of this lesson is to demonstrate to patients how a healthy outlook is an essential part of maintaining good health, especially as they continue their cardiac rehab journey.  Healthy Sleep for a Healthy Heart Clinical staff led group instruction and group discussion with PowerPoint presentation and patient guidebook. To enhance the learning environment the use of posters, models and videos may be added. At the conclusion of this workshop, patients will be able to demonstrate knowledge of the importance of sleep to overall health, well-being, and quality of life. They will understand the symptoms of, and treatments for, common sleep disorders. Patients will also be able to identify daytime and nighttime behaviors which impact sleep, and they will be able to apply these tools to help manage sleep-related challenges. The purpose of this lesson is to provide patients with a general overview of sleep and outline the importance of quality sleep. Patients will learn about a few of the most common sleep disorders. Patients will also be introduced to the concept of "sleep hygiene," and discover ways to self-manage certain sleeping problems through simple daily behavior changes. Finally, the workshop will motivate patients by clarifying  the links between quality sleep and their goals of heart-healthy living.   Recognizing and Reducing Stress Clinical staff led group instruction and group discussion with PowerPoint presentation and patient guidebook. To enhance the learning  environment the use of posters, models and videos may be added. At the conclusion of this workshop, patients will be able to understand the types of stress reactions, differentiate between acute and chronic stress, and recognize the impact that chronic stress has on their health. They will also be able to apply different coping mechanisms, such as reframing negative self-talk. Patients will have the opportunity to practice a variety of stress management techniques, such as deep abdominal breathing, progressive muscle relaxation, and/or guided imagery.  The purpose of this lesson is to educate patients on the role of stress in their lives and to provide healthy techniques for coping with it.  Learning Barriers/Preferences:  Learning Barriers/Preferences - 11/18/23 1022       Learning Barriers/Preferences   Learning Barriers Sight   wears glasses, HOH wears bilateral hearing aids   Learning Preferences Audio;Verbal Instruction;Computer/Internet;Video;Group Instruction;Written Material;Individual Instruction;Pictoral;Skilled Demonstration             Education Topics:  Knowledge Questionnaire Score:  Knowledge Questionnaire Score - 11/18/23 1206       Knowledge Questionnaire Score   Pre Score 22/24             Core Components/Risk Factors/Patient Goals at Admission:  Personal Goals and Risk Factors at Admission - 11/18/23 1031       Core Components/Risk Factors/Patient Goals on Admission   Diabetes Yes    Intervention Provide education about signs/symptoms and action to take for hypo/hyperglycemia.;Provide education about proper nutrition, including hydration, and aerobic/resistive exercise prescription along with prescribed medications to achieve blood glucose in normal ranges: Fasting glucose 65-99 mg/dL    Expected Outcomes Short Term: Participant verbalizes understanding of the signs/symptoms and immediate care of hyper/hypoglycemia, proper foot care and importance of  medication, aerobic/resistive exercise and nutrition plan for blood glucose control.;Long Term: Attainment of HbA1C < 7%.    Hypertension Yes    Intervention Provide education on lifestyle modifcations including regular physical activity/exercise, weight management, moderate sodium restriction and increased consumption of fresh fruit, vegetables, and low fat dairy, alcohol moderation, and smoking cessation.;Monitor prescription use compliance.    Expected Outcomes Short Term: Continued assessment and intervention until BP is < 140/12mm HG in hypertensive participants. < 130/63mm HG in hypertensive participants with diabetes, heart failure or chronic kidney disease.;Long Term: Maintenance of blood pressure at goal levels.    Lipids Yes    Intervention Provide education and support for participant on nutrition & aerobic/resistive exercise along with prescribed medications to achieve LDL 70mg , HDL >40mg .    Expected Outcomes Short Term: Participant states understanding of desired cholesterol values and is compliant with medications prescribed. Participant is following exercise prescription and nutrition guidelines.;Long Term: Cholesterol controlled with medications as prescribed, with individualized exercise RX and with personalized nutrition plan. Value goals: LDL < 70mg , HDL > 40 mg.             Core Components/Risk Factors/Patient Goals Review:   Goals and Risk Factor Review     Row Name 11/23/23 1345 11/29/23 1430 12/24/23 1056         Core Components/Risk Factors/Patient Goals Review   Personal Goals Review Weight Management/Obesity;Lipids;Hypertension;Diabetes Weight Management/Obesity;Lipids;Hypertension;Diabetes Weight Management/Obesity;Lipids;Hypertension;Diabetes     Review Bonnie Chavez started cardiac rehab on 11/22/23. Bonnie Chavez did well with exercise for her fitness level. Vital  signs and CBg's were stable. Bonnie Chavez uses a cane for stability. Bonnie Chavez started cardiac rehab on 11/22/23. Bonnie Chavez is off  to a good start with exercise  Vital signs and CBg's have been stable. Bonnie Chavez is doing well  with exercise  at cardiac rehab.  Vital signs and CBg's have been stable.     Expected Outcomes Bonnie Chavez will conitnue to participate in cardiac rehab for exercise, nutrition and lifestyle modifications Bonnie Chavez will conitnue to participate in cardiac rehab for exercise, nutrition and lifestyle modifications Bonnie Chavez will conitnue to participate in cardiac rehab for exercise, nutrition and lifestyle modifications              Core Components/Risk Factors/Patient Goals at Discharge (Final Review):   Goals and Risk Factor Review - 12/24/23 1056       Core Components/Risk Factors/Patient Goals Review   Personal Goals Review Weight Management/Obesity;Lipids;Hypertension;Diabetes    Review Bonnie Chavez is doing well  with exercise  at cardiac rehab.  Vital signs and CBg's have been stable.    Expected Outcomes Bonnie Chavez will conitnue to participate in cardiac rehab for exercise, nutrition and lifestyle modifications             ITP Comments:  ITP Comments     Row Name 11/11/23 0923 11/23/23 1343 11/29/23 1428 12/24/23 1054     ITP Comments Dr. Armanda Magic medical director. Introduction to pritikin education/ intensive cardiac rehab. Initial orientation packet reviewed with patient. 30 Day ITP Review. Bonnie Chavez started cardiac rehab on 11/22/23. Bonnie Chavez did well with exercise for her fitness elve 30 Day ITP Review. Bonnie Chavez started cardiac rehab on 11/22/23. Bonnie Chavez is off to a good start with exercise 30 Day ITP Review. Bonnie Chavez has good attendance and participation in cardiac rehab.             Comments: See ITP comments.Thayer Headings RN BSN

## 2023-12-27 ENCOUNTER — Encounter (HOSPITAL_COMMUNITY)
Admission: RE | Admit: 2023-12-27 | Discharge: 2023-12-27 | Disposition: A | Discharge status: Home / Self Care | Payer: Medicare HMO | Source: Ambulatory Visit | Attending: Cardiovascular Disease | Admitting: Cardiovascular Disease

## 2023-12-27 DIAGNOSIS — I4891 Unspecified atrial fibrillation: Secondary | ICD-10-CM

## 2023-12-27 DIAGNOSIS — Z48812 Encounter for surgical aftercare following surgery on the circulatory system: Secondary | ICD-10-CM | POA: Insufficient documentation

## 2023-12-27 DIAGNOSIS — Z952 Presence of prosthetic heart valve: Secondary | ICD-10-CM

## 2023-12-28 ENCOUNTER — Ambulatory Visit (INDEPENDENT_AMBULATORY_CARE_PROVIDER_SITE_OTHER): Payer: Medicare HMO | Admitting: Podiatry

## 2023-12-28 DIAGNOSIS — M2041 Other hammer toe(s) (acquired), right foot: Secondary | ICD-10-CM

## 2023-12-28 DIAGNOSIS — L84 Corns and callosities: Secondary | ICD-10-CM

## 2023-12-28 DIAGNOSIS — B351 Tinea unguium: Secondary | ICD-10-CM | POA: Diagnosis not present

## 2023-12-28 DIAGNOSIS — M79674 Pain in right toe(s): Secondary | ICD-10-CM

## 2023-12-28 DIAGNOSIS — E1149 Type 2 diabetes mellitus with other diabetic neurological complication: Secondary | ICD-10-CM | POA: Diagnosis not present

## 2023-12-28 DIAGNOSIS — M79675 Pain in left toe(s): Secondary | ICD-10-CM | POA: Diagnosis not present

## 2023-12-28 DIAGNOSIS — M2142 Flat foot [pes planus] (acquired), left foot: Secondary | ICD-10-CM

## 2023-12-28 DIAGNOSIS — M2141 Flat foot [pes planus] (acquired), right foot: Secondary | ICD-10-CM

## 2023-12-28 DIAGNOSIS — M2042 Other hammer toe(s) (acquired), left foot: Secondary | ICD-10-CM

## 2023-12-28 NOTE — Progress Notes (Unsigned)
Patient presents to the office today for diabetic shoe and insole measuring.  Patient was measured with brannock device to determine size and width for 1 pair of extra depth shoes and foam casted for 3 pair of insoles.   Documentation of medical necessity will be sent to patient's treating diabetic doctor to verify and sign.   Patient's diabetic provider: Dr. Cathie Olden and insoles will be ordered at that time and patient will be notified for an appointment for fitting when they arrive.   Shoe size (per patient): 8.5W Patient shoe selection- Shoe choice:   [id number] / [id number] Shoe size ordered: 8.5WD

## 2023-12-28 NOTE — Progress Notes (Signed)
 Subjective: Chief Complaint  Patient presents with   [facility]    RM# DFC  A1C 7.6 a year ago     89 year old female presents for above concerns.  She presents today for thick, elongated nails that she is unable to trim herself.  Also, diabetic shoes.  She does have neuropathy and she is on gabapentin  for this.  It seems to be stable.  No other concerns.  Mercer Clotilda SAUNDERS, MD    Objective: AAO x3, NAD DP/PT pulses palpable bilaterally, CRT less than 3 seconds Nails continue be hypertrophic, dystrophic with yellow, brown discoloration.  There is no edema no erythema.  Tenderness nails 1-5 bilaterally. Hammertoes present.  Callus on medial left 2nd toe from where the 2nd toe and hallux are rubbing.  Bunion, hammertoes present.  Flatfoot present. No pain with calf compression, swelling, warmth, erythema  Assessment: Symptomatic onychomycosis  Plan: -All treatment options discussed with the patient including all alternatives, risks, complications.  -Sharply debrided the nails x 10 without any complications or bleeding.  -As a courtesy debride the hyperkeratotic lesions courtesy as it was quite minimal.  Continue offloading. -I do think she will benefit from new diabetic shoes.  She was seen today by Lolita, pedorthist for evaluation.  Donnice SAUNDERS Fees DPM

## 2023-12-29 ENCOUNTER — Encounter (HOSPITAL_COMMUNITY)
Admission: RE | Admit: 2023-12-29 | Discharge: 2023-12-29 | Disposition: A | Discharge status: Home / Self Care | Payer: Medicare HMO | Source: Ambulatory Visit | Attending: Cardiovascular Disease

## 2023-12-29 DIAGNOSIS — Z48812 Encounter for surgical aftercare following surgery on the circulatory system: Secondary | ICD-10-CM | POA: Diagnosis not present

## 2023-12-29 DIAGNOSIS — I4891 Unspecified atrial fibrillation: Secondary | ICD-10-CM

## 2023-12-29 DIAGNOSIS — Z952 Presence of prosthetic heart valve: Secondary | ICD-10-CM

## 2023-12-31 ENCOUNTER — Encounter (HOSPITAL_COMMUNITY)
Admission: RE | Admit: 2023-12-31 | Discharge: 2023-12-31 | Disposition: A | Discharge status: Home / Self Care | Payer: Medicare HMO | Source: Ambulatory Visit | Attending: Cardiovascular Disease

## 2023-12-31 DIAGNOSIS — I4891 Unspecified atrial fibrillation: Secondary | ICD-10-CM

## 2023-12-31 DIAGNOSIS — Z48812 Encounter for surgical aftercare following surgery on the circulatory system: Secondary | ICD-10-CM | POA: Diagnosis not present

## 2023-12-31 DIAGNOSIS — Z952 Presence of prosthetic heart valve: Secondary | ICD-10-CM

## 2023-12-31 NOTE — Progress Notes (Signed)
 Reviewed home exercise Rx with patient today.  Encouraged warm-up, cool-down, and stretching. Reviewed THRR of 53 - 106 and keeping RPE between 11-13. Encouraged to hydrate with activity.  Reviewed weather parameters for temperature and humidity for safe exercise outdoors. Reviewed S/S to terminate exercise and when to call 911 vs MD.  Pt encouraged to always carry a cell phone for safety when exercising outdoors. Pt verbalized understanding of the home exercise Rx and was provided a copy.   Alm Parkins MS, ACSM-CEP, CCRP

## 2024-01-03 ENCOUNTER — Encounter (HOSPITAL_COMMUNITY)
Admission: RE | Admit: 2024-01-03 | Discharge: 2024-01-03 | Disposition: A | Discharge status: Home / Self Care | Payer: Medicare HMO | Source: Ambulatory Visit | Attending: Cardiovascular Disease

## 2024-01-03 DIAGNOSIS — I4891 Unspecified atrial fibrillation: Secondary | ICD-10-CM

## 2024-01-03 DIAGNOSIS — Z48812 Encounter for surgical aftercare following surgery on the circulatory system: Secondary | ICD-10-CM | POA: Diagnosis not present

## 2024-01-03 DIAGNOSIS — Z952 Presence of prosthetic heart valve: Secondary | ICD-10-CM

## 2024-01-05 ENCOUNTER — Encounter (HOSPITAL_COMMUNITY)
Admission: RE | Admit: 2024-01-05 | Discharge: 2024-01-05 | Disposition: A | Discharge status: Home / Self Care | Payer: Medicare HMO | Source: Ambulatory Visit | Attending: Cardiovascular Disease

## 2024-01-05 DIAGNOSIS — Z48812 Encounter for surgical aftercare following surgery on the circulatory system: Secondary | ICD-10-CM | POA: Diagnosis not present

## 2024-01-05 DIAGNOSIS — I4891 Unspecified atrial fibrillation: Secondary | ICD-10-CM

## 2024-01-05 DIAGNOSIS — Z952 Presence of prosthetic heart valve: Secondary | ICD-10-CM

## 2024-01-07 ENCOUNTER — Encounter (HOSPITAL_COMMUNITY)
Admission: RE | Admit: 2024-01-07 | Discharge: 2024-01-07 | Disposition: A | Discharge status: Home / Self Care | Payer: Medicare HMO | Source: Ambulatory Visit | Attending: Cardiovascular Disease | Admitting: Cardiovascular Disease

## 2024-01-07 DIAGNOSIS — Z48812 Encounter for surgical aftercare following surgery on the circulatory system: Secondary | ICD-10-CM | POA: Diagnosis not present

## 2024-01-07 DIAGNOSIS — Z952 Presence of prosthetic heart valve: Secondary | ICD-10-CM

## 2024-01-07 DIAGNOSIS — I4891 Unspecified atrial fibrillation: Secondary | ICD-10-CM

## 2024-01-10 ENCOUNTER — Ambulatory Visit: Payer: Medicare HMO | Admitting: Family Medicine

## 2024-01-10 ENCOUNTER — Encounter (HOSPITAL_COMMUNITY)
Admission: RE | Admit: 2024-01-10 | Discharge: 2024-01-10 | Disposition: A | Discharge status: Home / Self Care | Payer: Medicare HMO | Source: Ambulatory Visit | Attending: Cardiovascular Disease

## 2024-01-10 DIAGNOSIS — Z48812 Encounter for surgical aftercare following surgery on the circulatory system: Secondary | ICD-10-CM | POA: Diagnosis not present

## 2024-01-10 DIAGNOSIS — Z952 Presence of prosthetic heart valve: Secondary | ICD-10-CM

## 2024-01-12 ENCOUNTER — Encounter (HOSPITAL_COMMUNITY): Payer: Medicare HMO

## 2024-01-13 ENCOUNTER — Ambulatory Visit: Payer: Medicare HMO | Admitting: Family Medicine

## 2024-01-14 ENCOUNTER — Encounter (HOSPITAL_COMMUNITY)
Admission: RE | Admit: 2024-01-14 | Discharge: 2024-01-14 | Disposition: A | Discharge status: Home / Self Care | Payer: Medicare HMO | Source: Ambulatory Visit | Attending: Cardiovascular Disease | Admitting: Cardiovascular Disease

## 2024-01-14 DIAGNOSIS — Z952 Presence of prosthetic heart valve: Secondary | ICD-10-CM

## 2024-01-14 DIAGNOSIS — Z48812 Encounter for surgical aftercare following surgery on the circulatory system: Secondary | ICD-10-CM | POA: Diagnosis not present

## 2024-01-17 ENCOUNTER — Encounter (HOSPITAL_COMMUNITY)
Admission: RE | Admit: 2024-01-17 | Discharge: 2024-01-17 | Disposition: A | Discharge status: Home / Self Care | Payer: Medicare HMO | Source: Ambulatory Visit | Attending: Cardiovascular Disease | Admitting: Cardiovascular Disease

## 2024-01-17 DIAGNOSIS — Z48812 Encounter for surgical aftercare following surgery on the circulatory system: Secondary | ICD-10-CM | POA: Diagnosis not present

## 2024-01-17 DIAGNOSIS — Z952 Presence of prosthetic heart valve: Secondary | ICD-10-CM

## 2024-01-19 ENCOUNTER — Encounter (HOSPITAL_COMMUNITY)
Admission: RE | Admit: 2024-01-19 | Discharge: 2024-01-19 | Disposition: A | Discharge status: Home / Self Care | Payer: Medicare HMO | Source: Ambulatory Visit | Attending: Cardiovascular Disease

## 2024-01-19 DIAGNOSIS — Z952 Presence of prosthetic heart valve: Secondary | ICD-10-CM

## 2024-01-19 DIAGNOSIS — Z48812 Encounter for surgical aftercare following surgery on the circulatory system: Secondary | ICD-10-CM | POA: Diagnosis not present

## 2024-01-20 ENCOUNTER — Ambulatory Visit: Payer: Medicare HMO | Admitting: Family Medicine

## 2024-01-20 ENCOUNTER — Encounter: Payer: Self-pay | Admitting: Family Medicine

## 2024-01-20 VITALS — BP 130/74 | HR 75 | Temp 97.6°F | Ht 63.0 in | Wt 185.6 lb

## 2024-01-20 DIAGNOSIS — K862 Cyst of pancreas: Secondary | ICD-10-CM

## 2024-01-20 DIAGNOSIS — I10 Essential (primary) hypertension: Secondary | ICD-10-CM | POA: Diagnosis not present

## 2024-01-20 DIAGNOSIS — Z952 Presence of prosthetic heart valve: Secondary | ICD-10-CM

## 2024-01-20 DIAGNOSIS — N1832 Chronic kidney disease, stage 3b: Secondary | ICD-10-CM | POA: Diagnosis not present

## 2024-01-20 DIAGNOSIS — I700 Atherosclerosis of aorta: Secondary | ICD-10-CM

## 2024-01-20 DIAGNOSIS — Z794 Long term (current) use of insulin: Secondary | ICD-10-CM

## 2024-01-20 DIAGNOSIS — E1122 Type 2 diabetes mellitus with diabetic chronic kidney disease: Secondary | ICD-10-CM | POA: Diagnosis not present

## 2024-01-20 DIAGNOSIS — E039 Hypothyroidism, unspecified: Secondary | ICD-10-CM

## 2024-01-20 NOTE — Progress Notes (Signed)
 Established Patient Office Visit   Subjective  Patient ID: Bonnie Chavez, female    DOB: Oct 02, 1934  Age: 89 y.o. MRN: [id number]  Chief Complaint  Patient presents with   Medical Management of Chronic Issues    4 moth follow-up, for Diabetes and hypertension, patient is in Cardiac rehab from a TAVR on 9/23 patient is seen endo next week and will have A1c- checked then      Pt is an 89 yo female with pmh sig for Severe AS s/p TAVR, DM II, HTN, carpal tunnel, CKD 3b, SNHL, HLD, 1st degree AV block, neuropathy, HFpEF, OA, who was seen for f/u.  Pt doing well.  In cardiac rehab s/p TAVR on 10/05/23.  On preop imaging several pancreatic cyst noted.  Checking bs and bp daily.  Bp 117-146/59-70, pulse mid to upper 50s.  Fasting BS 101-135.  Has f/u with Dr.Ballin.    Patient Active Problem List   Diagnosis Date Noted   1st degree AV block 10/06/2023   S/P TAVR (transcatheter aortic valve replacement) 10/05/2023   Severe aortic stenosis    Carpal tunnel syndrome    Neuropathy    (HFpEF) heart failure with preserved ejection fraction (HCC)    CKD stage 3b, GFR 30-44 ml/min (HCC)    Dysfunction of right eustachian tube 09/17/2020   Excessive cerumen in ear canal, bilateral 02/01/2018   Sensorineural hearing loss (SNHL), bilateral 01/19/2017   Hyperparathyroidism (HCC) 02/27/2016   Chronic low back pain 02/27/2016   Morbid obesity (HCC) 02/27/2016   Chronic kidney disease 05/24/2014   Hyperlipidemia 08/21/2013   PMB (postmenopausal bleeding) 05/15/2013   Multiple thyroid nodules 03/09/2013   Vitamin D deficiency 03/09/2013   Osteoarthritis 02/06/2010   Vaginal atrophy 01/12/2002    Class: History of   Diabetes mellitus, type II (HCC) 09/07/2001    Class: History of   Past Medical History:  Diagnosis Date   (HFpEF) heart failure with preserved ejection fraction (HCC)    Anemia    History of , resolved   Carpal tunnel syndrome    Chronic low back pain 02/27/2016   -DDD -seeing  [facility]    CKD stage 3b, GFR 30-44 ml/min (HCC)    Diabetes mellitus    type 2, sees Dr. Talmage Nap   Goiter    Dr. Talmage Nap   History of blood transfusion    auto transfusion post knee replacement   Hyperlipidemia    Hyperparathyroidism (HCC) 02/27/2016   -sees endocrinologist, Dr. Talmage Nap, has seen surgeon as well    Hypertension    Migraine    no longer has migraines (80's) after menopause   Mitral valve prolapse    Neuropathy    Osteoarthritis    DDD, sees [facility]   PMB (postmenopausal bleeding) 02/28/2009   Resolved   S/P TAVR (transcatheter aortic valve replacement) 10/05/2023   s/p TAVR with a 23 mm Edwards S3UR via the TF approach by Dr. Excell Seltzer and Laneta Simmers   Severe aortic stenosis    Vaginal atrophy    Vitamin D deficiency    Past Surgical History:  Procedure Laterality Date   CARPAL TUNNEL RELEASE Right 04/30/2023   Procedure: RIGHT CARPAL TUNNEL RELEASE;  Surgeon: Betha Loa, MD;  Location: [facility] OR;  Service: Orthopedics;  Laterality: Right;  30 MIN   CESAREAN SECTION  1972 &1976   CHOLECYSTECTOMY  1991   COLONOSCOPY     CORONARY ANGIOGRAPHY N/A 08/16/2023   Procedure: CORONARY ANGIOGRAPHY;  Surgeon: Tonny Bollman, MD;  Location: [facility] INVASIVE [facility];  Service: Cardiovascular;  Laterality: N/A;   DILATATION & CURRETTAGE/HYSTEROSCOPY WITH RESECTOCOPE N/A 05/15/2013   Procedure: DILATATION & CURETTAGE/HYSTEROSCOPY WITH RESECTOCOPE;  Surgeon: Hal Morales, MD;  Location: [facility] ORS;  Service: Gynecology;  Laterality: N/A;   DILATATION & CURRETTAGE/HYSTEROSCOPY WITH RESECTOCOPE N/A 06/03/2017   Procedure: DILATATION & CURETTAGE/HYSTEROSCOPY;  Surgeon: Hal Morales, MD;  Location: [facility] OR[facility];  Service: Gynecology;  Laterality: N/A;   DILATION AND CURETTAGE OF UTERUS  1975   HEMORRHOID SURGERY  1997   KNEE SURGERY  2000 2011   right knee replacement    REPLACEMENT TOTAL KNEE  2000   left   SHOULDER SURGERY  2004 2010    TRANSTHORACIC ECHOCARDIOGRAM  07/2010    EF=>55%; LA mildly dilated; trace MR/TR;   TUBAL LIGATION  1976   Social History   Tobacco Use   Smoking status: Never   Smokeless tobacco: Never  Vaping Use   Vaping status: Never Used  Substance Use Topics   Alcohol use: No    Alcohol/week: 0.0 standard drinks of alcohol   Drug use: No   Family History  Problem Relation Age of Onset   Hypertension Mother    Diabetes Mother    Hyperlipidemia Mother    Heart attack Mother    Heart disease Father    Heart attack Father    Stroke Brother    Heart disease Brother    Diabetes Brother    Uterine cancer Maternal Grandmother    Allergies  Allergen Reactions   Lisinopril Hives    Cough, hives, and lip edema    Nsaids Other (See Comments)    Abnormal renal function   Penicillins Other (See Comments) and Rash    Pt has taken Keflex without difficulty  Has patient had a PCN reaction causing immediate rash, facial/tongue/throat swelling, SOB or lightheadedness with hypotension: yes  Has patient had a PCN reaction causing severe rash involving mucus membranes or skin necrosis: no  Has patient had a PCN reaction that required hospitalization no  Has patient had a PCN reaction occurring within the last 10 years: no  If all of the above answers are "NO", then may proceed with Cephalosporin use.      ROS Negative unless stated above    Objective:     BP 130/74 (BP Location: Left Arm, Patient Position: Sitting, Cuff Size: Normal)   Pulse 75   Temp 97.6 F (36.4 C) (Oral)   Ht 5\' 3"  (1.6 m)   Wt 185 lb 9.6 oz (84.2 kg)   SpO2 97%   BMI 32.88 kg/m  BP Readings from Last 3 Encounters:  01/20/24 130/74  11/25/23 125/68  11/18/23 (!) 140/64   Wt Readings from Last 3 Encounters:  01/20/24 185 lb 9.6 oz (84.2 kg)  11/25/23 186 lb (84.4 kg)  11/18/23 186 lb 11.7 oz (84.7 kg)      Physical Exam Constitutional:      General: She is not in acute distress.    Appearance: Normal appearance.  HENT:     Head:  Normocephalic and atraumatic.     Nose: Nose normal.     Mouth/Throat:     Mouth: Mucous membranes are moist.  Cardiovascular:     Rate and Rhythm: Normal rate and regular rhythm.     Heart sounds: Murmur heard.     No gallop.     Comments: A soft murmur R upper chest. Pulmonary:     Effort: Pulmonary effort  is normal. No respiratory distress.     Breath sounds: Normal breath sounds. No wheezing, rhonchi or rales.  Skin:    General: Skin is warm and dry.  Neurological:     Mental Status: She is alert and oriented to person, place, and time.    No results found for any visits on 01/20/24.    Assessment & Plan:  Type 2 diabetes mellitus with stage 3b chronic kidney disease, with long-term current use of insulin (HCC) -controlled. -Hemoglobin A1c 7.9% on 09/16/2023. -continue Humulin 70/30 4 units in a.m. and 8 units in the evening -Eye exam done 02/01/2023, negative for diabetic retinopathy -foot exam done 07/05/23 -Continue follow-up with Dr. Cleon Gustin, endocrinology  Essential hypertension -controlled -continued lifestyle modifications -continue norvasc 5 mg daily, nebivolol 10 mg. -also taking lasix 40 mg daily.  Acquired hypothyroidism -subclinical hypothyroidism? -synthroid 50 mcg daily  S/P TAVR (transcatheter aortic valve replacement) -stable -continue cardiac rehab -continue f/u with Cardiology  Pancreatic cysts -Initially seen on pre-op CTA chest/abd pelvis w/wo on 08/30/23..  MR advised. -MR abdomen with without contrast 10/25/2023: 1.Multiple cystic lesions throughout the pancreas, largest and most numerous within the pancreatic tail, largest individual lesion clustered with several others in distal pancreatic tail measuring 2.1 x 1.2 cm.  Overall cluster of cyst within the pancreatic tail measures 4.9 x 1.8 cm.  Additional small lesions within the pancreatic body and in the ventral pancreatic head.  No associated solid component contrast-enhancement.  Findings most  likely reflect multiple IPMN's, and are very likely benign.  In general, size and complexity of largest lesion in the pancreatic tail would indicate consideration of EUS/FNA, however given very advanced age, further workup or surveillance is of doubtful clinical value. 2.  Status postcholecystectomy.  Mild postoperative biliary ductal dilation.  Aortic atherosclerosis -continue to monitor  Stage 3b chronic kidney disease (HCC) -stable -eGFR 35 on 09/16/2023.  Creatinine 1.4 on 12/14/2023 -renally dose meds and avoid nephrotoxic meds -continue f/u with Nephro  Aortic Atherosclerosis -Crestor 40 mg  Return in about 5 months (around 06/18/2024).   Deeann Saint, MD

## 2024-01-21 ENCOUNTER — Encounter (HOSPITAL_COMMUNITY)
Admission: RE | Admit: 2024-01-21 | Discharge: 2024-01-21 | Disposition: A | Discharge status: Home / Self Care | Payer: Medicare HMO | Source: Ambulatory Visit | Attending: Cardiovascular Disease | Admitting: Cardiovascular Disease

## 2024-01-21 DIAGNOSIS — Z952 Presence of prosthetic heart valve: Secondary | ICD-10-CM

## 2024-01-21 DIAGNOSIS — Z48812 Encounter for surgical aftercare following surgery on the circulatory system: Secondary | ICD-10-CM | POA: Diagnosis not present

## 2024-01-21 DIAGNOSIS — I4891 Unspecified atrial fibrillation: Secondary | ICD-10-CM

## 2024-01-24 ENCOUNTER — Encounter (HOSPITAL_COMMUNITY)
Admission: RE | Admit: 2024-01-24 | Discharge: 2024-01-24 | Disposition: A | Discharge status: Home / Self Care | Payer: Medicare HMO | Source: Ambulatory Visit | Attending: Cardiovascular Disease | Admitting: Cardiovascular Disease

## 2024-01-24 DIAGNOSIS — Z48812 Encounter for surgical aftercare following surgery on the circulatory system: Secondary | ICD-10-CM | POA: Diagnosis not present

## 2024-01-24 DIAGNOSIS — Z952 Presence of prosthetic heart valve: Secondary | ICD-10-CM | POA: Diagnosis present

## 2024-01-25 NOTE — Progress Notes (Signed)
 Cardiac Individual Treatment Plan  Patient Details  Name: Bonnie Chavez MRN: [id number] Date of Birth: Dec 19, 1933 Referring Provider:   Flowsheet Row INTENSIVE CARDIAC REHAB from 11/18/2023 in [facility]  Referring Provider Nicki Guadalajara, MD       Initial Encounter Date:  Flowsheet Row INTENSIVE CARDIAC REHAB from 11/18/2023 in [facility] for [facility], Vascular, [facility]  Date 11/18/23       Visit Diagnosis: 10/05/23 TAVR  Patient's Home Medications on Admission:  Current Outpatient Medications:    acetaminophen (TYLENOL) 500 MG tablet, Take 500 mg by mouth every 6 (six) hours as needed (Arthritis)., Disp: , Rfl:    amLODipine (NORVASC) 5 MG tablet, Take 1 tablet (5 mg total) by mouth daily., Disp: 30 tablet, Rfl: 6   aspirin 81 MG EC tablet, Take 81 mg by mouth at bedtime. Swallow whole., Disp: , Rfl:    azithromycin (ZITHROMAX) 500 MG tablet, Take 1 tablet (500 mg total) 1 hour prior to dental appointments, Disp: 3 tablet, Rfl: 3   BD PEN NEEDLE NANO 2ND GEN 32G X 4 MM MISC, USE AS DIRECTED 90, Disp: , Rfl:    cinacalcet (SENSIPAR) 30 MG tablet, Take 30 mg by mouth every Monday, Wednesday, and Friday., Disp: , Rfl:    furosemide (LASIX) 20 MG tablet, Take 40 mg by mouth every morning. Can take additional tablet if needed, Disp: 30 tablet, Rfl:    gabapentin (NEURONTIN) 100 MG capsule, Take 100 mg by mouth at bedtime., Disp: , Rfl:    HUMULIN 70/30 KWIKPEN (70-30) 100 UNIT/ML KwikPen, Inject 4-8 Units into the skin See admin instructions. 4 units in the AM and 8 units in the pm, Disp: , Rfl:    levothyroxine (SYNTHROID) 50 MCG tablet, Take 50 mcg by mouth daily before breakfast., Disp: , Rfl:    nebivolol (BYSTOLIC) 10 MG tablet, Take 1 tablet (10 mg total) by mouth daily., Disp: 30 tablet, Rfl: 3   OneTouch Delica Lancets 33G MISC, Apply topically., Disp: , Rfl:    ONETOUCH VERIO test strip, 1  each 2 (two) times daily., Disp: , Rfl:    rosuvastatin (CRESTOR) 40 MG tablet, TAKE 1 TABLET BY MOUTH EVERYDAY AT BEDTIME, Disp: 90 tablet, Rfl: 1   Trolamine Salicylate (BLUE-EMU HEMP EX), Apply 1 application  topically daily as needed (pain)., Disp: , Rfl:   Past Medical History: Past Medical History:  Diagnosis Date   (HFpEF) heart failure with preserved ejection fraction (HCC)    Anemia    History of , resolved   Carpal tunnel syndrome    Chronic low back pain 02/27/2016   -DDD -seeing [place] [facility]    CKD stage 3b, GFR 30-44 ml/min (HCC)    Diabetes mellitus    type 2, sees Dr. Talmage Nap   Goiter    Dr. Talmage Nap   History of blood transfusion    auto transfusion post knee replacement   Hyperlipidemia    Hyperparathyroidism (HCC) 02/27/2016   -sees endocrinologist, Dr. Talmage Nap, has seen surgeon as well    Hypertension    Migraine    no longer has migraines (80's) after menopause   Mitral valve prolapse    Neuropathy    Osteoarthritis    DDD, sees [place] [facility]   PMB (postmenopausal bleeding) 02/28/2009   Resolved   S/P TAVR (transcatheter aortic valve replacement) 10/05/2023   s/p TAVR with a 23 mm Edwards S3UR via the TF approach  by Dr. Excell Seltzer and [facility]   Severe aortic stenosis    Vaginal atrophy    Vitamin D deficiency     Tobacco Use: Social History   Tobacco Use  Smoking Status Never  Smokeless Tobacco Never    Labs: Review Flowsheet  More data exists      Latest Ref Rng & Units 06/09/2018 12/27/2019 12/15/2021 01/05/2023 10/05/2023  Labs for ITP Cardiac and Pulmonary Rehab  Cholestrol 0 - 200 - - - 173     -  HDL-C 35 - 70 - - - 60     -  Trlycerides 40 - 160 - - - 77     -  Hemoglobin A1c - 7.3  7.8  7.3  7.6     -  TCO2 22 - 32 mmol/L - - - - 25     Details       This result is from an external source.         Capillary Blood Glucose: Lab Results  Component Value Date   GLUCAP 162 (H) 11/29/2023   GLUCAP 107 (H) 11/26/2023   GLUCAP 114 (H)  11/26/2023   GLUCAP 135 (H) 11/22/2023   GLUCAP 144 (H) 11/22/2023     Exercise Target Goals: Exercise Program Goal: Individual exercise prescription set using results from initial 6 min walk test and THRR while considering  patient's activity barriers and safety.   Exercise Prescription Goal: Initial exercise prescription builds to 30-45 minutes a day of aerobic activity, 2-3 days per week.  Home exercise guidelines will be given to patient during program as part of exercise prescription that the participant will acknowledge.  Activity Barriers & Risk Stratification:  Activity Barriers & Cardiac Risk Stratification - 11/18/23 1021       Activity Barriers & Cardiac Risk Stratification   Activity Barriers Left Knee Replacement;Right Knee Replacement;Neck/Spine Problems;Joint Problems;Deconditioning;Muscular Weakness;Back Problems;Arthritis    Cardiac Risk Stratification High             6 Minute Walk:  6 Minute Walk     Row Name 11/18/23 1200         6 Minute Walk   Phase Initial  Used Go-cart     Distance 970 feet     Walk Time 6 minutes     # of Rest Breaks 0     MPH 1.84     METS 1     RPE 11     Perceived Dyspnea  0     VO2 Peak 3.22     Symptoms No     Resting HR 65 bpm     Resting BP 140/64     Resting Oxygen Saturation  100 %     Exercise Oxygen Saturation  during 6 min walk 98 %     Max Ex. HR 76 bpm     Max Ex. BP 152/78     2 Minute Post BP 134/70              Oxygen Initial Assessment:   Oxygen Re-Evaluation:   Oxygen Discharge (Final Oxygen Re-Evaluation):   Initial Exercise Prescription:  Initial Exercise Prescription - 11/18/23 1200       Date of Initial Exercise RX and Referring Provider   Date 11/18/23    Referring Provider Nicki Guadalajara, MD    Expected Discharge Date 02/02/24      NuStep   Level 3    SPM 75    Minutes 25    METs  1      Prescription Details   Frequency (times per week) 3    Duration Progress to 30  minutes of continuous aerobic without signs/symptoms of physical distress      Intensity   THRR 40-80% of Max Heartrate 53-106    Ratings of Perceived Exertion 11-13    Perceived Dyspnea 0-4      Progression   Progression Continue progressive overload as per policy without signs/symptoms or physical distress.      Resistance Training   Training Prescription Yes    Weight 2 lbs    Reps 10-15             Perform Capillary Blood Glucose checks as needed.  Exercise Prescription Changes:   Exercise Prescription Changes     Row Name 11/22/23 1600 12/06/23 1500 12/22/23 1400 12/31/23 1400 01/03/24 1500     Response to Exercise   Blood Pressure (Admit) 142/64 142/68 130/62 138/70 120/60   Blood Pressure (Exercise) 152/60 152/64 -- -- --   Blood Pressure (Exit) 138/72 112/60 112/60 124/54 112/52   Heart Rate (Admit) 68 bpm 68 bpm 56 bpm 59 bpm 55 bpm   Heart Rate (Exercise) 95 bpm 76 bpm 93 bpm 77 bpm 89 bpm   Heart Rate (Exit) 77 bpm 74 bpm 60 bpm 67 bpm 64 bpm   Rating of Perceived Exertion (Exercise) 11 11 11 11 11    Symptoms None None None None None   Comments Pt's first day in the CRP2 program Reviewed METs Reviewed METs and goals Reviewed home exercise Rx Reviewed METs   Duration Progress to 30 minutes of  aerobic without signs/symptoms of physical distress Continue with 30 min of aerobic exercise without signs/symptoms of physical distress. Continue with 30 min of aerobic exercise without signs/symptoms of physical distress. Continue with 30 min of aerobic exercise without signs/symptoms of physical distress. Continue with 30 min of aerobic exercise without signs/symptoms of physical distress.   Intensity THRR unchanged THRR unchanged THRR unchanged THRR unchanged THRR unchanged     Progression   Progression Continue to progress workloads to maintain intensity without signs/symptoms of physical distress. Continue to progress workloads to maintain intensity without  signs/symptoms of physical distress. Continue to progress workloads to maintain intensity without signs/symptoms of physical distress. Continue to progress workloads to maintain intensity without signs/symptoms of physical distress. Continue to progress workloads to maintain intensity without signs/symptoms of physical distress.   Average METs 1.9 2.2 2.2 2.2 2.3     Resistance Training   Training Prescription Yes Yes No Yes Yes   Weight 2 lbs 2 lbs No weights on Wednesdays 2 lbs 2 lbs   Reps 10-15 10-15 -- 10-15 10-15   Time 10 Minutes 10 Minutes -- 10 Minutes 10 Minutes     Interval Training   Interval Training No No No No No     NuStep   Level 1 1 2 2 2    SPM 75 80 80 78 83   Minutes 25 30 30 30 30    METs 1.9 2.2 2.2 2.2 2.3     Home Exercise Plan   Plans to continue exercise at -- -- -- Home (comment) Home (comment)   Frequency -- -- -- Add 2 additional days to program exercise sessions. Add 2 additional days to program exercise sessions.   Initial Home Exercises Provided -- -- -- 12/31/23 12/31/23    Row Name 01/21/24 1400  Response to Exercise   Blood Pressure (Admit) 130/70       Blood Pressure (Exit) 114/58       Heart Rate (Admit) 59 bpm       Heart Rate (Exercise) 78 bpm       Heart Rate (Exit) 66 bpm       Rating of Perceived Exertion (Exercise) 11       Symptoms None       Comments Reviewed METs and goals       Duration Continue with 30 min of aerobic exercise without signs/symptoms of physical distress.       Intensity THRR unchanged         Progression   Progression Continue to progress workloads to maintain intensity without signs/symptoms of physical distress.       Average METs 2.3         Resistance Training   Training Prescription Yes       Weight 2 lbs       Reps 10-15       Time 10 Minutes         Interval Training   Interval Training No         NuStep   Level 2       SPM 85       Minutes 30       METs 2.3         Home  Exercise Plan   Plans to continue exercise at Home (comment)       Frequency Add 2 additional days to program exercise sessions.       Initial Home Exercises Provided 12/31/23                Exercise Comments:   Exercise Comments     Row Name 11/22/23 1624 12/06/23 1510 12/22/23 1435 12/31/23 1424 01/03/24 1500   Exercise Comments Pt's first day in the CRP2 program. Pt exercised today with no complaints. Reviewed METs. Pt is making slow, steady progress. Reviewed METs and goals. Pt increased workload on nustep today. Reviewed home exercise Rx. Pt currently doing warm-up/cooldown exercises at home. Encouraged to add walking indoors. Pt also given information on [place] [employer] [facility] [employer] classes on [employer] (TV provider pt has). Pt verbalized understanding of the home exercise Rx and was provided a copy. Reviewed METs. Progressing slowly.    Row Name 01/21/24 1418           Exercise Comments Reviewed METs and goals. Pt reports she is exercising 3x/week at home in addtion to the CRP2 program.                Exercise Goals and Review:   Exercise Goals     Row Name 11/11/23 0924             Exercise Goals   Increase Physical Activity Yes       Intervention Provide advice, education, support and counseling about physical activity/exercise needs.;Develop an individualized exercise prescription for aerobic and resistive training based on initial evaluation findings, risk stratification, comorbidities and participant's personal goals.       Expected Outcomes Short Term: Attend rehab on a regular basis to increase amount of physical activity.;Long Term: Exercising regularly at least 3-5 days a week.;Long Term: Add in home exercise to make exercise part of routine and to increase amount of physical activity.       Increase Strength and Stamina Yes       Intervention Provide advice,  education, support and counseling about physical activity/exercise needs.;Develop an individualized  exercise prescription for aerobic and resistive training based on initial evaluation findings, risk stratification, comorbidities and participant's personal goals.       Expected Outcomes Short Term: Increase workloads from initial exercise prescription for resistance, speed, and METs.;Short Term: Perform resistance training exercises routinely during rehab and add in resistance training at home;Long Term: Improve cardiorespiratory fitness, muscular endurance and strength as measured by increased METs and functional capacity ( )       Able to understand and use rate of perceived exertion (RPE) scale Yes       Intervention Provide education and explanation on how to use RPE scale       Expected Outcomes Short Term: Able to use RPE daily in rehab to express subjective intensity level;Long Term:  Able to use RPE to guide intensity level when exercising independently       Knowledge and understanding of Target Heart Rate Range (THRR) Yes       Intervention Provide education and explanation of THRR including how the numbers were predicted and where they are located for reference       Expected Outcomes Short Term: Able to state/look up THRR;Short Term: Able to use daily as guideline for intensity in rehab;Long Term: Able to use THRR to govern intensity when exercising independently       Understanding of Exercise Prescription Yes       Intervention Provide education, explanation, and written materials on patient's individual exercise prescription       Expected Outcomes Short Term: Able to explain program exercise prescription;Long Term: Able to explain home exercise prescription to exercise independently                Exercise Goals Re-Evaluation :  Exercise Goals Re-Evaluation     Row Name 11/22/23 1623 12/22/23 1435 01/21/24 1416         Exercise Goal Re-Evaluation   Exercise Goals Review Increase Physical Activity;Increase Strength and Stamina;Able to understand and use rate of perceived  exertion (RPE) scale;Knowledge and understanding of Target Heart Rate Range (THRR);Understanding of Exercise Prescription Increase Physical Activity;Increase Strength and Stamina;Able to understand and use rate of perceived exertion (RPE) scale;Knowledge and understanding of Target Heart Rate Range (THRR);Understanding of Exercise Prescription Increase Physical Activity;Increase Strength and Stamina;Able to understand and use rate of perceived exertion (RPE) scale;Knowledge and understanding of Target Heart Rate Range (THRR);Understanding of Exercise Prescription     Comments Pt's first day in the CRP2 program. Pt understnads the Exercise Rx, RPE scale, and THRR. Reviewed METs and goals today. Pt voices progress on her goals of imrpoved strength and stamina. Pt reports at home that she is able to stand at the kitchen sink for a longer period of time. She also voices that she is napping less. Reviewed METs and goals today. Pt continue to voice progress on her goals of improved strength and stamina. Pt reports at home that she is able to stand at the kitchen sink for a longer period of time. She also voices that she coninutes to  nap less. Pt voices she feels the exercise is helping her remain independent with her ADL's, and this was a goal for her.     Expected Outcomes Will continue to montior and progress exercise workloads as tolerated. Will continue to montior and progress exercise workloads as tolerated. Will continue to montior and progress exercise workloads as tolerated.  Discharge Exercise Prescription (Final Exercise Prescription Changes):  Exercise Prescription Changes - 01/21/24 1400       Response to Exercise   Blood Pressure (Admit) 130/70    Blood Pressure (Exit) 114/58    Heart Rate (Admit) 59 bpm    Heart Rate (Exercise) 78 bpm    Heart Rate (Exit) 66 bpm    Rating of Perceived Exertion (Exercise) 11    Symptoms None    Comments Reviewed METs and goals    Duration  Continue with 30 min of aerobic exercise without signs/symptoms of physical distress.    Intensity THRR unchanged      Progression   Progression Continue to progress workloads to maintain intensity without signs/symptoms of physical distress.    Average METs 2.3      Resistance Training   Training Prescription Yes    Weight 2 lbs    Reps 10-15    Time 10 Minutes      Interval Training   Interval Training No      NuStep   Level 2    SPM 85    Minutes 30    METs 2.3      Home Exercise Plan   Plans to continue exercise at Home (comment)    Frequency Add 2 additional days to program exercise sessions.    Initial Home Exercises Provided 12/31/23             Nutrition:  Target Goals: Understanding of nutrition guidelines, daily intake of sodium 1500mg , cholesterol 200mg , calories 30% from fat and 7% or less from saturated fats, daily to have 5 or more servings of fruits and vegetables.  Biometrics:  Pre Biometrics - 11/18/23 1015       Pre Biometrics   Waist Circumference 44.5 inches    Hip Circumference 46.25 inches    Waist to Hip Ratio 0.96 %    Triceps Skinfold 45 mm    % Body Fat 49.5 %    Grip Strength 13 kg    Flexibility --   No performed due to back issues   Single Leg Stand 1.2 seconds              Nutrition Therapy Plan and Nutrition Goals:  Nutrition Therapy & Goals - 01/24/24 1359       Nutrition Therapy   Diet Heart Healthy Diet    Drug/Food Interactions Statins/Certain Fruits      Personal Nutrition Goals   Nutrition Goal Patient to identify strategies for reducing cardiovascular risk by attending the [employer] education and nutrition series weekly.   goal in action.   Personal Goal #2 Patient to improve diet quality by using the plate method as a guide for meal planning to include lean protein/plant protein, fruits, vegetables, whole grains, nonfat dairy as part of a well-balanced diet.   goal in progress.   Comments Goals in progress.  Kensie has medical history of DM2, CKD3, HTN, s/p TAVR. She continues to attend the [employer] education series. HTN well controlled (norvasc, lasix) and continues follow-up with PCP, cardiology. A1c is above goal (7.6). Patient will continue to benefit from participation in intensive cardiac rehab for nutrition, exercise, and lifestyle modification.      Intervention Plan   Intervention Prescribe, educate and counsel regarding individualized specific dietary modifications aiming towards targeted core components such as weight, hypertension, lipid management, diabetes, heart failure and other comorbidities.;Nutrition handout(s) given to patient.    Expected Outcomes Short Term Goal: Understand basic principles of dietary  content, such as calories, fat, sodium, cholesterol and nutrients.;Long Term Goal: Adherence to prescribed nutrition plan.             Nutrition Assessments:  MEDIFICTS Score Key: >=70 Need to make dietary changes  40-70 Heart Healthy Diet <= 40 Therapeutic Level Cholesterol Diet   Flowsheet Row INTENSIVE CARDIAC REHAB from 11/18/2023 in [facility]  Picture Your Plate Total Score on Admission 73      Picture Your Plate Scores: <65 Unhealthy dietary pattern with much room for improvement. 41-50 Dietary pattern unlikely to meet recommendations for good health and room for improvement. 51-60 More healthful dietary pattern, with some room for improvement.  >60 Healthy dietary pattern, although there may be some specific behaviors that could be improved.    Nutrition Goals Re-Evaluation:  Nutrition Goals Re-Evaluation     Row Name 01/24/24 1359             Goals   Current Weight 188 lb 11.4 oz (85.6 kg)       Comment LDL 99, A1c 7.6, Cr 1.43, GFR 35       Expected Outcome Goals in progress. Xitlally has medical history of DM2, CKD3, HTN, s/p TAVR. She continues to attend the [employer] education series. HTN  well controlled (norvasc, lasix) and continues follow-up with PCP, cardiology. A1c is above goal (7.6). Patient will continue to benefit from participation in intensive cardiac rehab for nutrition, exercise, and lifestyle modification.                Nutrition Goals Re-Evaluation:  Nutrition Goals Re-Evaluation     Row Name 01/24/24 1359             Goals   Current Weight 188 lb 11.4 oz (85.6 kg)       Comment LDL 99, A1c 7.6, Cr 1.43, GFR 35       Expected Outcome Goals in progress. Syvanna has medical history of DM2, CKD3, HTN, s/p TAVR. She continues to attend the [employer] education series. HTN well controlled (norvasc, lasix) and continues follow-up with PCP, cardiology. A1c is above goal (7.6). Patient will continue to benefit from participation in intensive cardiac rehab for nutrition, exercise, and lifestyle modification.                Nutrition Goals Discharge (Final Nutrition Goals Re-Evaluation):  Nutrition Goals Re-Evaluation - 01/24/24 1359       Goals   Current Weight 188 lb 11.4 oz (85.6 kg)    Comment LDL 99, A1c 7.6, Cr 1.43, GFR 35    Expected Outcome Goals in progress. Kaelea has medical history of DM2, CKD3, HTN, s/p TAVR. She continues to attend the [employer] education series. HTN well controlled (norvasc, lasix) and continues follow-up with PCP, cardiology. A1c is above goal (7.6). Patient will continue to benefit from participation in intensive cardiac rehab for nutrition, exercise, and lifestyle modification.             Psychosocial: Target Goals: Acknowledge presence or absence of significant depression and/or stress, maximize coping skills, provide positive support system. Participant is able to verbalize types and ability to use techniques and skills needed for reducing stress and depression.  Initial Review & Psychosocial Screening:  Initial Psych Review & Screening - 11/11/23 1150       Initial Review   Current issues with None Identified       Family Dynamics   Good Support System? Yes  Frona has her two sons, sister, sister in law and nieces for support     Barriers   Psychosocial barriers to participate in program There are no identifiable barriers or psychosocial needs.      Screening Interventions   Interventions Encouraged to exercise             Quality of Life Scores:  Quality of Life - 11/18/23 1205       Quality of Life   Select Quality of Life      Quality of Life Scores   Health/Function Pre 25.47 %    Socioeconomic Pre 25.79 %    Psych/Spiritual Pre 28.29 %    Family Pre 26.3 %    GLOBAL Pre 26.24 %            Scores of 19 and below usually indicate a poorer quality of life in these areas.  A difference of  2-3 points is a clinically meaningful difference.  A difference of 2-3 points in the total score of the Quality of Life Index has been associated with significant improvement in overall quality of life, self-image, physical symptoms, and general health in studies assessing change in quality of life.  PHQ-9: Review Flowsheet  More data exists      01/20/2024 11/11/2023 09/08/2023 02/05/2023 10/07/2022  Depression screen PHQ 2/9  Decreased Interest 0 0 0 0 0  Down, Depressed, Hopeless 0 0 0 0 0  PHQ - 2 Score 0 0 0 0 0  Altered sleeping 0 0 0 0 -  Tired, decreased energy 0 0 3 3 -  Change in appetite 0 0 0 0 -  Feeling bad or failure about yourself  0 0 0 0 -  Trouble concentrating 0 0 0 0 -  Moving slowly or fidgety/restless 0 0 0 0 -  Suicidal thoughts 0 0 0 0 -  PHQ-9 Score 0 0 3 3 -  Difficult doing work/chores Not difficult at all Not difficult at all Somewhat difficult Somewhat difficult -   Interpretation of Total Score  Total Score Depression Severity:  1-4 = Minimal depression, 5-9 = Mild depression, 10-14 = Moderate depression, 15-19 = Moderately severe depression, 20-27 = Severe depression   Psychosocial Evaluation and Intervention:   Psychosocial Re-Evaluation:   Psychosocial Re-Evaluation     Row Name 11/23/23 1344 11/29/23 1429 12/24/23 1055 01/21/24 1331       Psychosocial Re-Evaluation   Current issues with None Identified None Identified None Identified None Identified    Interventions Encouraged to attend Cardiac Rehabilitation for the exercise Encouraged to attend Cardiac Rehabilitation for the exercise Encouraged to attend Cardiac Rehabilitation for the exercise Encouraged to attend Cardiac Rehabilitation for the exercise    Continue Psychosocial Services  No Follow up required No Follow up required No Follow up required No Follow up required             Psychosocial Discharge (Final Psychosocial Re-Evaluation):  Psychosocial Re-Evaluation - 01/21/24 1331       Psychosocial Re-Evaluation   Current issues with None Identified    Interventions Encouraged to attend Cardiac Rehabilitation for the exercise    Continue Psychosocial Services  No Follow up required             Vocational Rehabilitation: Provide vocational rehab assistance to qualifying candidates.   Vocational Rehab Evaluation & Intervention:  Vocational Rehab - 11/11/23 1150       Initial Vocational Rehab Evaluation & Intervention   Assessment shows need  for Vocational Rehabilitation No   Nikisha is retired and does not need vocational rehab at this time.            Education: Education Goals: Education classes will be provided on a weekly basis, covering required topics. Participant will state understanding/return demonstration of topics presented.    Education     Row Name 11/22/23 1600     Education   Cardiac Education Topics Pritikin   Geographical information systems officer Psychosocial   Psychosocial Workshop Focused Goals, Sustainable Changes   Instruction Review Code 1- Verbalizes Understanding   Class Start Time 1400   Class Stop Time 1440   Class Time Calculation (min) 40 min    Row Name 11/26/23 1400      Education   Cardiac Education Topics Pritikin   Licensed conveyancer Nutrition   Nutrition Dining Out - Part 1   Instruction Review Code 1- Verbalizes Understanding   Class Start Time 1358   Class Stop Time 1442   Class Time Calculation (min) 44 min    Row Name 11/29/23 1400     Education   Cardiac Education Topics Pritikin   Psychologist, forensic Exercise Education   Exercise Education Biomechanial Limitations   Instruction Review Code 1- Verbalizes Understanding   Class Start Time 1400   Class Stop Time 1440   Class Time Calculation (min) 40 min    Row Name 12/01/23 1500     Education   Cardiac Education Topics Pritikin   Customer service manager   Weekly Topic Comforting Weekend Breakfasts   Instruction Review Code 1- Verbalizes Understanding   Class Start Time 1400   Class Stop Time 1440   Class Time Calculation (min) 40 min    Row Name 12/08/23 1600     Education   Cardiac Education Topics Pritikin   Customer service manager   Weekly Topic Fast Evening Meals   Instruction Review Code 1- Verbalizes Understanding   Class Start Time 1400   Class Stop Time 1441   Class Time Calculation (min) 41 min    Row Name 12/10/23 1400     Education   Cardiac Education Topics Pritikin   Glass blower/designer Nutrition   Nutrition Workshop Fueling a Forensic psychologist   Instruction Review Code 1- Tax inspector   Class Start Time 1400   Class Stop Time 1432   Class Time Calculation (min) 32 min    Row Name 12/13/23 1500     Education   Cardiac Education Topics Pritikin   Geographical information systems officer Psychosocial   Psychosocial Workshop Healthy Sleep for a Healthy Heart   Instruction  Review Code 1- Verbalizes Understanding   Class Start Time 1400   Class Stop Time 1445   Class Time Calculation (min) 45 min    Row Name 12/17/23 1400     Education   Cardiac Education Topics Pritikin   Nurse, children's Exercise Physiologist   Select Psychosocial   Psychosocial How  Our Thoughts Can Heal Our Hearts   Instruction Review Code 1- Verbalizes Understanding   Class Start Time 1358   Class Stop Time 1435   Class Time Calculation (min) 37 min    Row Name 12/20/23 1500     Education   Cardiac Education Topics Pritikin   Select Workshops     Workshops   Educator Exercise Physiologist   Select Exercise   Exercise Workshop Managing Heart Disease: Your Path to a Healthier Heart   Instruction Review Code 1- Verbalizes Understanding   Class Start Time 1355   Class Stop Time 1445   Class Time Calculation (min) 50 min    Row Name 12/22/23 1500     Education   Cardiac Education Topics Pritikin   Orthoptist   Educator Dietitian   Weekly Topic Simple Sides and Sauces   Instruction Review Code 1- Verbalizes Understanding   Class Start Time 1145   Class Stop Time 1226   Class Time Calculation (min) 41 min    Row Name 12/27/23 1300     Education   Cardiac Education Topics Pritikin   Geographical information systems officer Psychosocial   Psychosocial Workshop From Head to Heart: The Power of a Healthy Outlook   Instruction Review Code 1- Verbalizes Understanding   Class Start Time 1400   Class Stop Time 1446   Class Time Calculation (min) 46 min    Row Name 12/29/23 1300     Education   Cardiac Education Topics Pritikin   Secondary school teacher School   Educator Nurse;Respiratory Therapist   Weekly Topic Powerhouse Plant-Based Proteins   Instruction Review Code 1- Verbalizes Understanding   Class Start Time 1150   Class Stop Time 1225   Class Time  Calculation (min) 35 min    Row Name 12/31/23 1300     Education   Cardiac Education Topics Pritikin   Hospital doctor Education   General Education Heart Disease Risk Reduction   Instruction Review Code 1- Verbalizes Understanding   Class Start Time 1350   Class Stop Time 1430   Class Time Calculation (min) 40 min    Row Name 01/03/24 1300     Education   Cardiac Education Topics Pritikin   Select Workshops     Workshops   Educator Exercise Physiologist   Select Exercise   Exercise Workshop Location manager and Fall Prevention   Instruction Review Code 1- Verbalizes Understanding   Class Start Time 1400   Class Stop Time 1445   Class Time Calculation (min) 45 min    Row Name 01/05/24 1400     Education   Cardiac Education Topics Pritikin   Customer service manager   Weekly Topic Adding Flavor - Sodium-Free   Instruction Review Code 1- Verbalizes Understanding   Class Start Time 1145   Class Stop Time 1225   Class Time Calculation (min) 40 min    Row Name 01/07/24 1500     Education   Cardiac Education Topics Pritikin   Licensed conveyancer Nutrition   Nutrition Overview of the Pritikin Eating Plan   Instruction Review Code 1- Verbalizes Understanding  Class Start Time 1357   Class Stop Time 1444   Class Time Calculation (min) 47 min    Row Name 01/10/24 1400     Education   Cardiac Education Topics Pritikin   Nurse, children's Exercise Physiologist   Select Psychosocial   Psychosocial Healthy Minds, Bodies, Hearts   Instruction Review Code 1- Verbalizes Understanding   Class Start Time 1409   Class Stop Time 1443   Class Time Calculation (min) 34 min    Row Name 01/14/24 1500     Education   Cardiac Education Topics Pritikin   Select Core Videos     Core  Videos   Educator Dietitian   Select Nutrition   Nutrition Other  Label Reading   Instruction Review Code 1- Verbalizes Understanding   Class Start Time 1358   Class Stop Time 1445   Class Time Calculation (min) 47 min    Row Name 01/17/24 1400     Education   Cardiac Education Topics Pritikin   Select Core Videos     Core Videos   Educator Nurse   Select Nutrition   Nutrition Becoming a Pritikin Chef   Instruction Review Code 1- Verbalizes Understanding   Class Start Time 1401   Class Stop Time 1436   Class Time Calculation (min) 35 min    Row Name 01/19/24 1500     Education   Cardiac Education Topics Pritikin   Orthoptist   Educator Dietitian;Nurse   Weekly Topic Personalizing Your Pritikin Plate   Instruction Review Code 1- Verbalizes Understanding   Class Start Time 1400   Class Stop Time 1439   Class Time Calculation (min) 39 min    Row Name 01/21/24 1400     Education   Cardiac Education Topics Pritikin   Glass blower/designer Nutrition   Nutrition Workshop Label Reading   Instruction Review Code 1- Verbalizes Understanding   Class Start Time 1145   Class Stop Time 1230   Class Time Calculation (min) 45 min    Row Name 01/24/24 1600     Education   Cardiac Education Topics Pritikin   Select Workshops     Workshops   Educator Exercise Physiologist   Select Psychosocial   Psychosocial Workshop Recognizing and Reducing Stress   Instruction Review Code 1- Verbalizes Understanding   Class Start Time 1358   Class Stop Time 1445   Class Time Calculation (min) 47 min            Core Videos: Exercise    Move It!  Clinical staff conducted group or individual video education with verbal and written material and guidebook.  Patient learns the recommended Pritikin exercise program. Exercise with the goal of living a long, healthy life. Some of the health benefits of exercise  include controlled diabetes, healthier blood pressure levels, improved cholesterol levels, improved heart and lung capacity, improved sleep, and better body composition. Everyone should speak with their doctor before starting or changing an exercise routine.  Biomechanical Limitations Clinical staff conducted group or individual video education with verbal and written material and guidebook.  Patient learns how biomechanical limitations can impact exercise and how we can mitigate and possibly overcome limitations to have an impactful and balanced exercise routine.  Body Composition Clinical staff conducted group or individual video education with verbal and written  material and guidebook.  Patient learns that body composition (ratio of muscle mass to fat mass) is a key component to assessing overall fitness, rather than body weight alone. Increased fat mass, especially visceral belly fat, can put [place] at increased risk for metabolic syndrome, type 2 diabetes, heart disease, and even death. It is recommended to combine diet and exercise (cardiovascular and resistance training) to improve your body composition. Seek guidance from your physician and exercise physiologist before implementing an exercise routine.  Exercise Action Plan Clinical staff conducted group or individual video education with verbal and written material and guidebook.  Patient learns the recommended strategies to achieve and enjoy long-term exercise adherence, including variety, self-motivation, self-efficacy, and positive decision making. Benefits of exercise include fitness, good health, weight management, more energy, better sleep, less stress, and overall well-being.  Medical   Heart Disease Risk Reduction Clinical staff conducted group or individual video education with verbal and written material and guidebook.  Patient learns our heart is our most vital organ as it circulates oxygen, nutrients, white blood cells, and hormones  throughout the entire body, and carries waste away. Data supports a plant-based eating plan like the Pritikin Program for its effectiveness in slowing progression of and reversing heart disease. The video provides a number of recommendations to address heart disease.   Metabolic Syndrome and Belly Fat  Clinical staff conducted group or individual video education with verbal and written material and guidebook.  Patient learns what metabolic syndrome is, how it leads to heart disease, and how one can reverse it and keep it from coming back. You have metabolic syndrome if you have 3 of the following 5 criteria: abdominal obesity, high blood pressure, high triglycerides, low HDL cholesterol, and high blood sugar.  Hypertension and Heart Disease Clinical staff conducted group or individual video education with verbal and written material and guidebook.  Patient learns that high blood pressure, or hypertension, is very common in the [place]. Hypertension is largely due to excessive salt intake, but other important risk factors include being overweight, physical inactivity, drinking too much alcohol, smoking, and not eating enough potassium from fruits and vegetables. High blood pressure is a leading risk factor for heart attack, stroke, congestive heart failure, dementia, kidney failure, and premature death. Long-term effects of excessive salt intake include stiffening of the arteries and thickening of heart muscle and organ damage. Recommendations include ways to reduce hypertension and the risk of heart disease.  Diseases of Our Time - Focusing on Diabetes Clinical staff conducted group or individual video education with verbal and written material and guidebook.  Patient learns why the best way to stop diseases of our time is prevention, through food and other lifestyle changes. Medicine (such as prescription pills and surgeries) is often only a Band-Aid on the problem, not a long-term solution. Most  common diseases of our time include obesity, type 2 diabetes, hypertension, heart disease, and cancer. The Pritikin Program is recommended and has been proven to help reduce, reverse, and/or prevent the damaging effects of metabolic syndrome.  Nutrition   Overview of the Pritikin Eating Plan  Clinical staff conducted group or individual video education with verbal and written material and guidebook.  Patient learns about the Pritikin Eating Plan for disease risk reduction. The Pritikin Eating Plan emphasizes a wide variety of unrefined, minimally-processed carbohydrates, like fruits, vegetables, whole grains, and legumes. Go, Caution, and Stop food choices are explained. Plant-based and lean animal proteins are emphasized. Rationale provided for low sodium intake  for blood pressure control, low added sugars for blood sugar stabilization, and low added fats and oils for coronary artery disease risk reduction and weight management.  Calorie Density  Clinical staff conducted group or individual video education with verbal and written material and guidebook.  Patient learns about calorie density and how it impacts the Pritikin Eating Plan. Knowing the characteristics of the food you choose will help you decide whether those foods will lead to weight gain or weight loss, and whether you want to consume more or less of them. Weight loss is usually a side effect of the Pritikin Eating Plan because of its focus on low calorie-dense foods.  Label Reading  Clinical staff conducted group or individual video education with verbal and written material and guidebook.  Patient learns about the Pritikin recommended label reading guidelines and corresponding recommendations regarding calorie density, added sugars, sodium content, and whole grains.  Dining Out - Part 1  Clinical staff conducted group or individual video education with verbal and written material and guidebook.  Patient learns that restaurant meals  can be sabotaging because they can be so high in calories, fat, sodium, and/or sugar. Patient learns recommended strategies on how to positively address this and avoid unhealthy pitfalls.  Facts on Fats  Clinical staff conducted group or individual video education with verbal and written material and guidebook.  Patient learns that lifestyle modifications can be just as effective, if not more so, as many medications for lowering your risk of heart disease. A Pritikin lifestyle can help to reduce your risk of inflammation and atherosclerosis (cholesterol build-up, or plaque, in the artery walls). Lifestyle interventions such as dietary choices and physical activity address the cause of atherosclerosis. A review of the types of fats and their impact on blood cholesterol levels, along with dietary recommendations to reduce fat intake is also included.  Nutrition Action Plan  Clinical staff conducted group or individual video education with verbal and written material and guidebook.  Patient learns how to incorporate Pritikin recommendations into their lifestyle. Recommendations include planning and keeping personal health goals in mind as an important part of their success.  Healthy Mind-Set    Healthy Minds, Bodies, Hearts  Clinical staff conducted group or individual video education with verbal and written material and guidebook.  Patient learns how to identify when they are stressed. Video will discuss the impact of that stress, as well as the many benefits of stress management. Patient will also be introduced to stress management techniques. The way we think, act, and feel has an impact on our hearts.  How Our Thoughts Can Heal Our Hearts  Clinical staff conducted group or individual video education with verbal and written material and guidebook.  Patient learns that negative thoughts can cause depression and anxiety. This can result in negative lifestyle behavior and serious health problems.  Cognitive behavioral therapy is an effective method to help control our thoughts in order to change and improve our emotional outlook.  Additional Videos:  Exercise    Improving Performance  Clinical staff conducted group or individual video education with verbal and written material and guidebook.  Patient learns to use a non-linear approach by alternating intensity levels and lengths of time spent exercising to help burn more calories and lose more body fat. Cardiovascular exercise helps improve heart health, metabolism, hormonal balance, blood sugar control, and recovery from fatigue. Resistance training improves strength, endurance, balance, coordination, reaction time, metabolism, and muscle mass. Flexibility exercise improves circulation, posture, and balance. Seek  guidance from your physician and exercise physiologist before implementing an exercise routine and learn your capabilities and proper form for all exercise.  Introduction to Yoga  Clinical staff conducted group or individual video education with verbal and written material and guidebook.  Patient learns about yoga, a discipline of the coming together of mind, breath, and body. The benefits of yoga include improved flexibility, improved range of motion, better posture and core strength, increased lung function, weight loss, and positive self-image. Yoga's heart health benefits include lowered blood pressure, healthier heart rate, decreased cholesterol and triglyceride levels, improved immune function, and reduced stress. Seek guidance from your physician and exercise physiologist before implementing an exercise routine and learn your capabilities and proper form for all exercise.  Medical   Aging: Enhancing Your Quality of Life  Clinical staff conducted group or individual video education with verbal and written material and guidebook.  Patient learns key strategies and recommendations to stay in good physical health and enhance  quality of life, such as prevention strategies, having an advocate, securing a Health Care Proxy and Power of Attorney, and keeping a list of medications and system for tracking them. It also discusses how to avoid risk for bone loss.  Biology of Weight Control  Clinical staff conducted group or individual video education with verbal and written material and guidebook.  Patient learns that weight gain occurs because we consume more calories than we burn (eating more, moving less). Even if your body weight is normal, you may have higher ratios of fat compared to muscle mass. Too much body fat puts you at increased risk for cardiovascular disease, heart attack, stroke, type 2 diabetes, and obesity-related cancers. In addition to exercise, following the Pritikin Eating Plan can help reduce your risk.  Decoding Lab Results  Clinical staff conducted group or individual video education with verbal and written material and guidebook.  Patient learns that lab test reflects one measurement whose values change over time and are influenced by many factors, including medication, stress, sleep, exercise, food, hydration, pre-existing medical conditions, and more. It is recommended to use the knowledge from this video to become more involved with your lab results and evaluate your numbers to speak with your doctor.   Diseases of Our Time - Overview  Clinical staff conducted group or individual video education with verbal and written material and guidebook.  Patient learns that according to the CDC, 50% to 70% of chronic diseases (such as obesity, type 2 diabetes, elevated lipids, hypertension, and heart disease) are avoidable through lifestyle improvements including healthier food choices, listening to satiety cues, and increased physical activity.  Sleep Disorders Clinical staff conducted group or individual video education with verbal and written material and guidebook.  Patient learns how good quality and  duration of sleep are important to overall health and well-being. Patient also learns about sleep disorders and how they impact health along with recommendations to address them, including discussing with a physician.  Nutrition  Dining Out - Part 2 Clinical staff conducted group or individual video education with verbal and written material and guidebook.  Patient learns how to plan ahead and communicate in order to maximize their dining experience in a healthy and nutritious manner. Included are recommended food choices based on the type of restaurant the patient is visiting.   Fueling a Banker conducted group or individual video education with verbal and written material and guidebook.  There is a strong connection between our food choices and our  health. Diseases like obesity and type 2 diabetes are very prevalent and are in large-part due to lifestyle choices. The Pritikin Eating Plan provides plenty of food and hunger-curbing satisfaction. It is easy to follow, affordable, and helps reduce health risks.  Menu Workshop  Clinical staff conducted group or individual video education with verbal and written material and guidebook.  Patient learns that restaurant meals can sabotage health goals because they are often packed with calories, fat, sodium, and sugar. Recommendations include strategies to plan ahead and to communicate with the manager, chef, or server to help order a healthier meal.  Planning Your Eating Strategy  Clinical staff conducted group or individual video education with verbal and written material and guidebook.  Patient learns about the Pritikin Eating Plan and its benefit of reducing the risk of disease. The Pritikin Eating Plan does not focus on calories. Instead, it emphasizes high-quality, nutrient-rich foods. By knowing the characteristics of the foods, we choose, we can determine their calorie density and make informed decisions.  Targeting Your  Nutrition Priorities  Clinical staff conducted group or individual video education with verbal and written material and guidebook.  Patient learns that lifestyle habits have a tremendous impact on disease risk and progression. This video provides eating and physical activity recommendations based on your personal health goals, such as reducing LDL cholesterol, losing weight, preventing or controlling type 2 diabetes, and reducing high blood pressure.  Vitamins and Minerals  Clinical staff conducted group or individual video education with verbal and written material and guidebook.  Patient learns different ways to obtain key vitamins and minerals, including through a recommended healthy diet. It is important to discuss all supplements you take with your doctor.   Healthy Mind-Set    Smoking Cessation  Clinical staff conducted group or individual video education with verbal and written material and guidebook.  Patient learns that cigarette smoking and tobacco addiction pose a serious health risk which affects millions of people. Stopping smoking will significantly reduce the risk of heart disease, lung disease, and many forms of cancer. Recommended strategies for quitting are covered, including working with your doctor to develop a successful plan.  Culinary   Becoming a Set designer conducted group or individual video education with verbal and written material and guidebook.  Patient learns that cooking at home can be healthy, cost-effective, quick, and puts them in control. Keys to cooking healthy recipes will include looking at your recipe, assessing your equipment needs, planning ahead, making it simple, choosing cost-effective seasonal ingredients, and limiting the use of added fats, salts, and sugars.  Cooking - Breakfast and Snacks  Clinical staff conducted group or individual video education with verbal and written material and guidebook.  Patient learns how important  breakfast is to satiety and nutrition through the entire day. Recommendations include key foods to eat during breakfast to help stabilize blood sugar levels and to prevent overeating at meals later in the day. Planning ahead is also a key component.  Cooking - Educational psychologist conducted group or individual video education with verbal and written material and guidebook.  Patient learns eating strategies to improve overall health, including an approach to cook more at home. Recommendations include thinking of animal protein as a side on your plate rather than center stage and focusing instead on lower calorie dense options like vegetables, fruits, whole grains, and plant-based proteins, such as beans. Making sauces in large quantities to freeze for later and leaving the skin  on your vegetables are also recommended to maximize your experience.  Cooking - Healthy Salads and Dressing Clinical staff conducted group or individual video education with verbal and written material and guidebook.  Patient learns that vegetables, fruits, whole grains, and legumes are the foundations of the Pritikin Eating Plan. Recommendations include how to incorporate each of these in flavorful and healthy salads, and how to create homemade salad dressings. Proper handling of ingredients is also covered. Cooking - Soups and [employer] - Soups and Desserts Clinical staff conducted group or individual video education with verbal and written material and guidebook.  Patient learns that Pritikin soups and desserts make for easy, nutritious, and delicious snacks and meal components that are low in sodium, fat, sugar, and calorie density, while high in vitamins, minerals, and filling fiber. Recommendations include simple and healthy ideas for soups and desserts.   Overview     The Pritikin Solution Program Overview Clinical staff conducted group or individual video education with verbal and written material  and guidebook.  Patient learns that the results of the Pritikin Program have been documented in more than 100 articles published in peer-reviewed journals, and the benefits include reducing risk factors for (and, in some cases, even reversing) high cholesterol, high blood pressure, type 2 diabetes, obesity, and more! An overview of the three key pillars of the Pritikin Program will be covered: eating well, doing regular exercise, and having a healthy mind-set.  WORKSHOPS  Exercise: Exercise Basics: Building Your Action Plan Clinical staff led group instruction and group discussion with PowerPoint presentation and patient guidebook. To enhance the learning environment the use of posters, models and videos may be added. At the conclusion of this workshop, patients will comprehend the difference between physical activity and exercise, as well as the benefits of incorporating both, into their routine. Patients will understand the FITT (Frequency, Intensity, Time, and Type) principle and how to use it to build an exercise action plan. In addition, safety concerns and other considerations for exercise and cardiac rehab will be addressed by the presenter. The purpose of this lesson is to promote a comprehensive and effective weekly exercise routine in order to improve patients' overall level of fitness.   Managing Heart Disease: Your Path to a Healthier Heart Clinical staff led group instruction and group discussion with PowerPoint presentation and patient guidebook. To enhance the learning environment the use of posters, models and videos may be added.At the conclusion of this workshop, patients will understand the anatomy and physiology of the heart. Additionally, they will understand how Pritikin's three pillars impact the risk factors, the progression, and the management of heart disease.  The purpose of this lesson is to provide a high-level overview of the heart, heart disease, and how the Pritikin  lifestyle positively impacts risk factors.  Exercise Biomechanics Clinical staff led group instruction and group discussion with PowerPoint presentation and patient guidebook. To enhance the learning environment the use of posters, models and videos may be added. Patients will learn how the structural parts of their bodies function and how these functions impact their daily activities, movement, and exercise. Patients will learn how to promote a neutral spine, learn how to manage pain, and identify ways to improve their physical movement in order to promote healthy living. The purpose of this lesson is to expose patients to common physical limitations that impact physical activity. Participants will learn practical ways to adapt and manage aches and pains, and to minimize their effect on regular exercise. Patients  will learn how to maintain good posture while sitting, walking, and lifting.  Balance Training and Fall Prevention  Clinical staff led group instruction and group discussion with PowerPoint presentation and patient guidebook. To enhance the learning environment the use of posters, models and videos may be added. At the conclusion of this workshop, patients will understand the importance of their sensorimotor skills (vision, proprioception, and the vestibular system) in maintaining their ability to balance as they age. Patients will apply a variety of balancing exercises that are appropriate for their current level of function. Patients will understand the common causes for poor balance, possible solutions to these problems, and ways to modify their physical environment in order to minimize their fall risk. The purpose of this lesson is to teach patients about the importance of maintaining balance as they age and ways to minimize their risk of falling.  WORKSHOPS   Nutrition:  Fueling a Ship broker led group instruction and group discussion with PowerPoint presentation  and patient guidebook. To enhance the learning environment the use of posters, models and videos may be added. Patients will review the foundational principles of the Pritikin Eating Plan and understand what constitutes a serving size in each of the food groups. Patients will also learn Pritikin-friendly foods that are better choices when away from home and review make-ahead meal and snack options. Calorie density will be reviewed and applied to three nutrition priorities: weight maintenance, weight loss, and weight gain. The purpose of this lesson is to reinforce (in a group setting) the key concepts around what patients are recommended to eat and how to apply these guidelines when away from home by planning and selecting Pritikin-friendly options. Patients will understand how calorie density may be adjusted for different weight management goals.  Mindful Eating  Clinical staff led group instruction and group discussion with PowerPoint presentation and patient guidebook. To enhance the learning environment the use of posters, models and videos may be added. Patients will briefly review the concepts of the Pritikin Eating Plan and the importance of low-calorie dense foods. The concept of mindful eating will be introduced as well as the importance of paying attention to internal hunger signals. Triggers for non-hunger eating and techniques for dealing with triggers will be explored. The purpose of this lesson is to provide patients with the opportunity to review the basic principles of the Pritikin Eating Plan, discuss the value of eating mindfully and how to measure internal cues of hunger and fullness using the Hunger Scale. Patients will also discuss reasons for non-hunger eating and learn strategies to use for controlling emotional eating.  Targeting Your Nutrition Priorities Clinical staff led group instruction and group discussion with PowerPoint presentation and patient guidebook. To enhance the  learning environment the use of posters, models and videos may be added. Patients will learn how to determine their genetic susceptibility to disease by reviewing their family history. Patients will gain insight into the importance of diet as part of an overall healthy lifestyle in mitigating the impact of genetics and other environmental insults. The purpose of this lesson is to provide patients with the opportunity to assess their personal nutrition priorities by looking at their family history, their own health history and current risk factors. Patients will also be able to discuss ways of prioritizing and modifying the Pritikin Eating Plan for their highest risk areas  Menu  Clinical staff led group instruction and group discussion with PowerPoint presentation and patient guidebook. To enhance the learning environment the  use of posters, models and videos may be added. Using menus brought in from [employer], or printed from [employer], patients will apply the Pritikin dining out guidelines that were presented in the [employer] video. Patients will also be able to practice these guidelines in a variety of provided scenarios. The purpose of this lesson is to provide patients with the opportunity to practice hands-on learning of the Pritikin Dining Out guidelines with actual menus and practice scenarios.  Label Reading Clinical staff led group instruction and group discussion with PowerPoint presentation and patient guidebook. To enhance the learning environment the use of posters, models and videos may be added. Patients will review and discuss the Pritikin label reading guidelines presented in Pritikin's Label Reading Educational series video. Using fool labels brought in from local grocery stores and markets, patients will apply the label reading guidelines and determine if the packaged food meet the Pritikin guidelines. The purpose of this lesson is to provide patients with  the opportunity to review, discuss, and practice hands-on learning of the Pritikin Label Reading guidelines with actual packaged food labels. Cooking School  Pritikin's [employer] are designed to teach patients ways to prepare quick, simple, and affordable recipes at home. The importance of nutrition's role in chronic disease risk reduction is reflected in its emphasis in the overall Pritikin program. By learning how to prepare essential core Pritikin Eating Plan recipes, patients will increase control over what they eat; be able to customize the flavor of foods without the use of added salt, sugar, or fat; and improve the quality of the food they consume. By learning a set of core recipes which are easily assembled, quickly prepared, and affordable, patients are more likely to prepare more healthy foods at home. These workshops focus on convenient breakfasts, simple entres, side dishes, and desserts which can be prepared with minimal effort and are consistent with nutrition recommendations for cardiovascular risk reduction. Cooking [employer] are taught by a Armed forces logistics/support/administrative officer (RD) who has been trained by the [employer]. The chef or RD has a clear understanding of the importance of minimizing - if not completely eliminating - added fat, sugar, and sodium in recipes. Throughout the series of Cooking School Workshop sessions, patients will learn about healthy ingredients and efficient methods of cooking to build confidence in their capability to prepare    Cooking School weekly topics:  Adding Flavor- Sodium-Free  Fast and Healthy Breakfasts  Powerhouse Plant-Based Proteins  Satisfying Salads and Dressings  Simple Sides and Sauces  International Cuisine-Spotlight on the [employer] Zones  Delicious Desserts  Savory Soups  [employer] - Meals in a Astronomer Appetizers and Snacks  Comforting Weekend Breakfasts  One-Pot Wonders   Fast Evening  Meals  Landscape architect Your Pritikin Plate  WORKSHOPS   Healthy Mindset (Psychosocial):  Focused Goals, Sustainable Changes Clinical staff led group instruction and group discussion with PowerPoint presentation and patient guidebook. To enhance the learning environment the use of posters, models and videos may be added. Patients will be able to apply effective goal setting strategies to establish at least one personal goal, and then take consistent, meaningful action toward that goal. They will learn to identify common barriers to achieving personal goals and develop strategies to overcome them. Patients will also gain an understanding of how our mind-set can impact our ability to achieve goals and the importance of cultivating a positive and growth-oriented mind-set. The purpose of this  lesson is to provide patients with a deeper understanding of how to set and achieve personal goals, as well as the tools and strategies needed to overcome common obstacles which may arise along the way.  From Head to Heart: The Power of a Healthy Outlook  Clinical staff led group instruction and group discussion with PowerPoint presentation and patient guidebook. To enhance the learning environment the use of posters, models and videos may be added. Patients will be able to recognize and describe the impact of emotions and mood on physical health. They will discover the importance of self-care and explore self-care practices which may work for them. Patients will also learn how to utilize the 4 C's to cultivate a healthier outlook and better manage stress and challenges. The purpose of this lesson is to demonstrate to patients how a healthy outlook is an essential part of maintaining good health, especially as they continue their cardiac rehab journey.  Healthy Sleep for a Healthy Heart Clinical staff led group instruction and group discussion with PowerPoint presentation and patient guidebook. To  enhance the learning environment the use of posters, models and videos may be added. At the conclusion of this workshop, patients will be able to demonstrate knowledge of the importance of sleep to overall health, well-being, and quality of life. They will understand the symptoms of, and treatments for, common sleep disorders. Patients will also be able to identify daytime and nighttime behaviors which impact sleep, and they will be able to apply these tools to help manage sleep-related challenges. The purpose of this lesson is to provide patients with a general overview of sleep and outline the importance of quality sleep. Patients will learn about a few of the most common sleep disorders. Patients will also be introduced to the concept of "sleep hygiene," and discover ways to self-manage certain sleeping problems through simple daily behavior changes. Finally, the workshop will motivate patients by clarifying the links between quality sleep and their goals of heart-healthy living.   Recognizing and Reducing Stress Clinical staff led group instruction and group discussion with PowerPoint presentation and patient guidebook. To enhance the learning environment the use of posters, models and videos may be added. At the conclusion of this workshop, patients will be able to understand the types of stress reactions, differentiate between acute and chronic stress, and recognize the impact that chronic stress has on their health. They will also be able to apply different coping mechanisms, such as reframing negative self-talk. Patients will have the opportunity to practice a variety of stress management techniques, such as deep abdominal breathing, progressive muscle relaxation, and/or guided imagery.  The purpose of this lesson is to educate patients on the role of stress in their lives and to provide healthy techniques for coping with it.  Learning Barriers/Preferences:  Learning Barriers/Preferences - 11/18/23  1022       Learning Barriers/Preferences   Learning Barriers Sight   wears glasses, HOH wears bilateral hearing aids   Learning Preferences Audio;Verbal Instruction;Computer/Internet;Video;Group Instruction;Written Material;Individual Instruction;Pictoral;Skilled Demonstration             Education Topics:  Knowledge Questionnaire Score:  Knowledge Questionnaire Score - 11/18/23 1206       Knowledge Questionnaire Score   Pre Score 22/24             Core Components/Risk Factors/Patient Goals at Admission:  Personal Goals and Risk Factors at Admission - 11/18/23 1031       Core Components/Risk Factors/Patient Goals on Admission   Diabetes  Yes    Intervention Provide education about signs/symptoms and action to take for hypo/hyperglycemia.;Provide education about proper nutrition, including hydration, and aerobic/resistive exercise prescription along with prescribed medications to achieve blood glucose in normal ranges: Fasting glucose 65-99 mg/dL    Expected Outcomes Short Term: Participant verbalizes understanding of the signs/symptoms and immediate care of hyper/hypoglycemia, proper foot care and importance of medication, aerobic/resistive exercise and nutrition plan for blood glucose control.;Long Term: Attainment of HbA1C < 7%.    Hypertension Yes    Intervention Provide education on lifestyle modifcations including regular physical activity/exercise, weight management, moderate sodium restriction and increased consumption of fresh fruit, vegetables, and low fat dairy, alcohol moderation, and smoking cessation.;Monitor prescription use compliance.    Expected Outcomes Short Term: Continued assessment and intervention until BP is < 140/62mm HG in hypertensive participants. < 130/45mm HG in hypertensive participants with diabetes, heart failure or chronic kidney disease.;Long Term: Maintenance of blood pressure at goal levels.    Lipids Yes    Intervention Provide education  and support for participant on nutrition & aerobic/resistive exercise along with prescribed medications to achieve LDL 70mg , HDL >40mg .    Expected Outcomes Short Term: Participant states understanding of desired cholesterol values and is compliant with medications prescribed. Participant is following exercise prescription and nutrition guidelines.;Long Term: Cholesterol controlled with medications as prescribed, with individualized exercise RX and with personalized nutrition plan. Value goals: LDL < 70mg , HDL > 40 mg.             Core Components/Risk Factors/Patient Goals Review:   Goals and Risk Factor Review     Row Name 11/23/23 1345 11/29/23 1430 12/24/23 1056 01/21/24 1331       Core Components/Risk Factors/Patient Goals Review   Personal Goals Review Weight Management/Obesity;Lipids;Hypertension;Diabetes Weight Management/Obesity;Lipids;Hypertension;Diabetes Weight Management/Obesity;Lipids;Hypertension;Diabetes Weight Management/Obesity;Lipids;Hypertension;Diabetes    Review Azarya started cardiac rehab on 11/22/23. Freada did well with exercise for her fitness level. Vital signs and CBg's were stable. Lacinda uses a cane for stability. Kameron started cardiac rehab on 11/22/23. Tranise is off to a good start with exercise  Vital signs and CBg's have been stable. Autum is doing well  with exercise  at cardiac rehab.  Vital signs and CBg's have been stable. Wave continues to do well  with exercise  at cardiac rehab.  Vital signs and CBg's remain stable. Jazzelle will complete cardiac rehab on 02/04/24.    Expected Outcomes Alexcia will conitnue to participate in cardiac rehab for exercise, nutrition and lifestyle modifications Shayana will conitnue to participate in cardiac rehab for exercise, nutrition and lifestyle modifications Veryl will conitnue to participate in cardiac rehab for exercise, nutrition and lifestyle modifications Aloni will conitnue to participate in cardiac rehab for exercise,  nutrition and lifestyle modifications             Core Components/Risk Factors/Patient Goals at Discharge (Final Review):   Goals and Risk Factor Review - 01/21/24 1331       Core Components/Risk Factors/Patient Goals Review   Personal Goals Review Weight Management/Obesity;Lipids;Hypertension;Diabetes    Review Maisie continues to do well  with exercise  at cardiac rehab.  Vital signs and CBg's remain stable. Perian will complete cardiac rehab on 02/04/24.    Expected Outcomes Zion will conitnue to participate in cardiac rehab for exercise, nutrition and lifestyle modifications             ITP Comments:  ITP Comments     Row Name 11/11/23 5956 11/23/23 1343 11/29/23 1428 12/24/23 1054 01/21/24  1328   ITP Comments Dr. Armanda Magic medical director. Introduction to pritikin education/ intensive cardiac rehab. Initial orientation packet reviewed with patient. 30 Day ITP Review. Olamae started cardiac rehab on 11/22/23. Lamyra did well with exercise for her fitness elve 30 Day ITP Review. Aigner started cardiac rehab on 11/22/23. Leonie is off to a good start with exercise 30 Day ITP Review. [place] has good attendance and participation in cardiac rehab. 30 Day ITP Review. Mykelti  continues to have  good attendance and participation with exercise  at cardiac rehab. Francesca will complete cardiac rehab on 02/04/24            Comments: See ITP comments.

## 2024-01-26 ENCOUNTER — Encounter (HOSPITAL_COMMUNITY)
Admission: RE | Admit: 2024-01-26 | Discharge: 2024-01-26 | Disposition: A | Discharge status: Home / Self Care | Payer: Medicare HMO | Source: Ambulatory Visit | Attending: Cardiovascular Disease | Admitting: Cardiovascular Disease

## 2024-01-26 VITALS — Ht 63.0 in | Wt 187.2 lb

## 2024-01-26 DIAGNOSIS — Z952 Presence of prosthetic heart valve: Secondary | ICD-10-CM

## 2024-01-27 LAB — LAB REPORT - SCANNED
A1c: 7.8
Albumin, Urine POC: 21.2
Creatinine, POC: 19.1 mg/dL
EGFR (African American): 34
Microalb Creat Ratio: 111
TSH: 1.54 (ref 0.41–5.90)

## 2024-01-28 ENCOUNTER — Encounter (HOSPITAL_COMMUNITY)
Admission: RE | Admit: 2024-01-28 | Discharge: 2024-01-28 | Disposition: A | Discharge status: Home / Self Care | Payer: Medicare HMO | Source: Ambulatory Visit | Attending: Cardiovascular Disease | Admitting: Cardiovascular Disease

## 2024-01-28 DIAGNOSIS — I4891 Unspecified atrial fibrillation: Secondary | ICD-10-CM

## 2024-01-28 DIAGNOSIS — Z952 Presence of prosthetic heart valve: Secondary | ICD-10-CM | POA: Diagnosis not present

## 2024-01-31 ENCOUNTER — Encounter (HOSPITAL_COMMUNITY)
Admission: RE | Admit: 2024-01-31 | Discharge: 2024-01-31 | Disposition: A | Discharge status: Home / Self Care | Payer: Medicare HMO | Source: Ambulatory Visit | Attending: Cardiovascular Disease

## 2024-01-31 DIAGNOSIS — Z952 Presence of prosthetic heart valve: Secondary | ICD-10-CM

## 2024-01-31 DIAGNOSIS — I4891 Unspecified atrial fibrillation: Secondary | ICD-10-CM

## 2024-02-02 ENCOUNTER — Encounter (HOSPITAL_COMMUNITY)
Admission: RE | Admit: 2024-02-02 | Discharge: 2024-02-02 | Disposition: A | Discharge status: Home / Self Care | Payer: Medicare HMO | Source: Ambulatory Visit | Attending: Cardiovascular Disease

## 2024-02-02 DIAGNOSIS — Z952 Presence of prosthetic heart valve: Secondary | ICD-10-CM | POA: Diagnosis not present

## 2024-02-02 DIAGNOSIS — I4891 Unspecified atrial fibrillation: Secondary | ICD-10-CM

## 2024-02-04 ENCOUNTER — Encounter (HOSPITAL_COMMUNITY)
Admission: RE | Admit: 2024-02-04 | Discharge: 2024-02-04 | Disposition: A | Discharge status: Home / Self Care | Payer: Medicare HMO | Source: Ambulatory Visit | Attending: Cardiovascular Disease | Admitting: Cardiovascular Disease

## 2024-02-04 DIAGNOSIS — Z952 Presence of prosthetic heart valve: Secondary | ICD-10-CM

## 2024-02-04 DIAGNOSIS — I4891 Unspecified atrial fibrillation: Secondary | ICD-10-CM

## 2024-02-07 ENCOUNTER — Encounter (HOSPITAL_COMMUNITY)
Admission: RE | Admit: 2024-02-07 | Discharge: 2024-02-07 | Disposition: A | Discharge status: Home / Self Care | Source: Ambulatory Visit | Attending: Cardiovascular Disease

## 2024-02-07 DIAGNOSIS — Z952 Presence of prosthetic heart valve: Secondary | ICD-10-CM

## 2024-02-08 LAB — HM DIABETES EYE EXAM

## 2024-02-09 ENCOUNTER — Encounter (HOSPITAL_COMMUNITY)
Admission: RE | Admit: 2024-02-09 | Discharge: 2024-02-09 | Disposition: A | Discharge status: Home / Self Care | Source: Ambulatory Visit | Attending: Cardiovascular Disease | Admitting: Cardiovascular Disease

## 2024-02-09 DIAGNOSIS — Z952 Presence of prosthetic heart valve: Secondary | ICD-10-CM | POA: Diagnosis not present

## 2024-02-09 DIAGNOSIS — I4891 Unspecified atrial fibrillation: Secondary | ICD-10-CM

## 2024-02-11 ENCOUNTER — Encounter (HOSPITAL_COMMUNITY)
Admission: RE | Admit: 2024-02-11 | Discharge: 2024-02-11 | Disposition: A | Discharge status: Home / Self Care | Source: Ambulatory Visit | Attending: Cardiovascular Disease | Admitting: Cardiovascular Disease

## 2024-02-11 DIAGNOSIS — Z952 Presence of prosthetic heart valve: Secondary | ICD-10-CM

## 2024-02-14 ENCOUNTER — Encounter (HOSPITAL_COMMUNITY)
Admission: RE | Admit: 2024-02-14 | Discharge: 2024-02-14 | Disposition: A | Discharge status: Home / Self Care | Source: Ambulatory Visit | Attending: Cardiovascular Disease | Admitting: Cardiovascular Disease

## 2024-02-14 DIAGNOSIS — I4891 Unspecified atrial fibrillation: Secondary | ICD-10-CM

## 2024-02-14 DIAGNOSIS — Z952 Presence of prosthetic heart valve: Secondary | ICD-10-CM

## 2024-02-16 ENCOUNTER — Encounter (HOSPITAL_COMMUNITY)
Admission: RE | Admit: 2024-02-16 | Discharge: 2024-02-16 | Disposition: A | Discharge status: Home / Self Care | Source: Ambulatory Visit | Attending: Cardiovascular Disease | Admitting: Cardiovascular Disease

## 2024-02-16 DIAGNOSIS — Z952 Presence of prosthetic heart valve: Secondary | ICD-10-CM | POA: Diagnosis not present

## 2024-02-16 DIAGNOSIS — I4891 Unspecified atrial fibrillation: Secondary | ICD-10-CM

## 2024-02-18 ENCOUNTER — Encounter (HOSPITAL_COMMUNITY)
Admission: RE | Admit: 2024-02-18 | Discharge: 2024-02-18 | Disposition: A | Discharge status: Home / Self Care | Source: Ambulatory Visit | Attending: Cardiovascular Disease | Admitting: Cardiovascular Disease

## 2024-02-18 DIAGNOSIS — Z952 Presence of prosthetic heart valve: Secondary | ICD-10-CM | POA: Diagnosis not present

## 2024-02-21 ENCOUNTER — Encounter: Payer: Self-pay | Admitting: Family Medicine

## 2024-02-21 ENCOUNTER — Ambulatory Visit (INDEPENDENT_AMBULATORY_CARE_PROVIDER_SITE_OTHER): Admitting: Family Medicine

## 2024-02-21 VITALS — BP 146/70 | HR 63 | Temp 98.2°F | Ht 63.0 in | Wt 185.6 lb

## 2024-02-21 DIAGNOSIS — E782 Mixed hyperlipidemia: Secondary | ICD-10-CM

## 2024-02-21 DIAGNOSIS — I10 Essential (primary) hypertension: Secondary | ICD-10-CM | POA: Diagnosis not present

## 2024-02-21 DIAGNOSIS — E213 Hyperparathyroidism, unspecified: Secondary | ICD-10-CM

## 2024-02-21 DIAGNOSIS — N1832 Chronic kidney disease, stage 3b: Secondary | ICD-10-CM

## 2024-02-21 DIAGNOSIS — E1122 Type 2 diabetes mellitus with diabetic chronic kidney disease: Secondary | ICD-10-CM

## 2024-02-21 DIAGNOSIS — Z794 Long term (current) use of insulin: Secondary | ICD-10-CM

## 2024-02-21 MED ORDER — AMLODIPINE BESYLATE 2.5 MG PO TABS: ORAL_TABLET | ORAL | 3 refills | Status: DC

## 2024-02-21 NOTE — Patient Instructions (Signed)
 A prescription for Amlodipine (Norvasc) 2.5 mg tabs was sent to the pharmacy for you.  You can take this new prescription along with a 5 mg tab for a total daily dose of 7.5 mg. You won't have to cut the tabs in half.  Continue monitoring your blood pressure.

## 2024-02-21 NOTE — Progress Notes (Signed)
 Established Patient Office Visit   Subjective  Patient ID: Bonnie Chavez, female    DOB: 09-25-1934  Age: 89 y.o. MRN: [id number]  Chief Complaint  Patient presents with   Hypertension    Blood pressure check, on sat the 22nd patient felt light headed bp was in the 150's     Patient is a 89 year old female seen for acute issue.  Patient notes BP elevation x 10 days.  Patient denies changes in diet, activity, medications.  States checked BP on 3/21 was elevated in the 150s systolic.  Highest BP reading 164/74 on 02/16/24.  Typically 120s-130s/60s-70s.    Blood sugar stable, has log of readings with her.  Copies made..  Patient recently completed cardiac rehab status post TAVR.  Patient has copy of labs from 01/27/2024 with nephrology.    Patient Active Problem List   Diagnosis Date Noted   1st degree AV block 10/06/2023   S/P TAVR (transcatheter aortic valve replacement) 10/05/2023   Severe aortic stenosis    Carpal tunnel syndrome    Neuropathy    (HFpEF) heart failure with preserved ejection fraction (HCC)    CKD stage 3b, GFR 30-44 ml/min (HCC)    Dysfunction of right eustachian tube 09/17/2020   Excessive cerumen in ear canal, bilateral 02/01/2018   Sensorineural hearing loss (SNHL), bilateral 01/19/2017   Hyperparathyroidism (HCC) 02/27/2016   Chronic low back pain 02/27/2016   Morbid obesity (HCC) 02/27/2016   Chronic kidney disease 05/24/2014   Hyperlipidemia 08/21/2013   PMB (postmenopausal bleeding) 05/15/2013   Multiple thyroid nodules 03/09/2013   Vitamin D deficiency 03/09/2013   Osteoarthritis 02/06/2010   Vaginal atrophy 01/12/2002    Class: History of   Diabetes mellitus, type II (HCC) 09/07/2001    Class: History of   Past Medical History:  Diagnosis Date   (HFpEF) heart failure with preserved ejection fraction (HCC)    Anemia    History of , resolved   Carpal tunnel syndrome    Chronic low back pain 02/27/2016   -DDD -seeing [facility]     CKD stage 3b, GFR 30-44 ml/min (HCC)    Diabetes mellitus    type 2, sees Dr. Talmage Nap   Goiter    Dr. Talmage Nap   History of blood transfusion    auto transfusion post knee replacement   Hyperlipidemia    Hyperparathyroidism (HCC) 02/27/2016   -sees endocrinologist, Dr. Talmage Nap, has seen surgeon as well    Hypertension    Migraine    no longer has migraines (80's) after menopause   Mitral valve prolapse    Neuropathy    Osteoarthritis    DDD, sees [facility]   PMB (postmenopausal bleeding) 02/28/2009   Resolved   S/P TAVR (transcatheter aortic valve replacement) 10/05/2023   s/p TAVR with a 23 mm Edwards S3UR via the TF approach by Dr. Excell Seltzer and Laneta Simmers   Severe aortic stenosis    Vaginal atrophy    Vitamin D deficiency    Past Surgical History:  Procedure Laterality Date   CARPAL TUNNEL RELEASE Right 04/30/2023   Procedure: RIGHT CARPAL TUNNEL RELEASE;  Surgeon: Betha Loa, MD;  Location: [facility] OR;  Service: Orthopedics;  Laterality: Right;  30 MIN   CESAREAN SECTION  1972 &1976   CHOLECYSTECTOMY  1991   COLONOSCOPY     CORONARY ANGIOGRAPHY N/A 08/16/2023   Procedure: CORONARY ANGIOGRAPHY;  Surgeon: Tonny Bollman, MD;  Location: [facility] INVASIVE CV LAB;  Service: Cardiovascular;  Laterality: N/A;   DILATATION &  CURRETTAGE/HYSTEROSCOPY WITH RESECTOCOPE N/A 05/15/2013   Procedure: DILATATION & CURETTAGE/HYSTEROSCOPY WITH RESECTOCOPE;  Surgeon: Hal Morales, MD;  Location: [facility] ORS;  Service: Gynecology;  Laterality: N/A;   DILATATION & CURRETTAGE/HYSTEROSCOPY WITH RESECTOCOPE N/A 06/03/2017   Procedure: DILATATION & CURETTAGE/HYSTEROSCOPY;  Surgeon: Hal Morales, MD;  Location: [facility] ORS;  Service: Gynecology;  Laterality: N/A;   DILATION AND CURETTAGE OF UTERUS  1975   HEMORRHOID SURGERY  1997   KNEE SURGERY  2000 2011   right knee replacement    REPLACEMENT TOTAL KNEE  2000   left   SHOULDER SURGERY  2004 2010    TRANSTHORACIC ECHOCARDIOGRAM  07/2010   EF=>55%; LA mildly dilated;  trace MR/TR;   TUBAL LIGATION  1976   Social History   Tobacco Use   Smoking status: Never   Smokeless tobacco: Never  Vaping Use   Vaping status: Never Used  Substance Use Topics   Alcohol use: No    Alcohol/week: 0.0 standard drinks of alcohol   Drug use: No   Family History  Problem Relation Age of Onset   Hypertension Mother    Diabetes Mother    Hyperlipidemia Mother    Heart attack Mother    Heart disease Father    Heart attack Father    Stroke Brother    Heart disease Brother    Diabetes Brother    Uterine cancer Maternal Grandmother    Allergies  Allergen Reactions   Lisinopril Hives    Cough, hives, and lip edema    Nsaids Other (See Comments)    Abnormal renal function   Penicillins Other (See Comments) and Rash    Pt has taken Keflex without difficulty  Has patient had a PCN reaction causing immediate rash, facial/tongue/throat swelling, SOB or lightheadedness with hypotension: yes  Has patient had a PCN reaction causing severe rash involving mucus membranes or skin necrosis: no  Has patient had a PCN reaction that required hospitalization no  Has patient had a PCN reaction occurring within the last 10 years: no  If all of the above answers are "NO", then may proceed with Cephalosporin use.      ROS Negative unless stated above    Objective:     BP (!) 146/78 (BP Location: Left Arm, Patient Position: Sitting, Cuff Size: Normal)   Pulse 63   Temp 98.2 F (36.8 C) (Oral)   Ht 5\' 3"  (1.6 m)   Wt 185 lb 9.6 oz (84.2 kg)   SpO2 98%   BMI 32.88 kg/m  BP Readings from Last 3 Encounters:  02/21/24 (!) 146/78  01/20/24 130/74  11/25/23 125/68   Wt Readings from Last 3 Encounters:  02/21/24 185 lb 9.6 oz (84.2 kg)  01/26/24 187 lb 2.7 oz (84.9 kg)  01/20/24 185 lb 9.6 oz (84.2 kg)      Physical Exam Constitutional:      General: She is not in acute distress.    Appearance: Normal appearance.  HENT:     Head: Normocephalic and  atraumatic.     Nose: Nose normal.     Mouth/Throat:     Mouth: Mucous membranes are moist.  Cardiovascular:     Rate and Rhythm: Normal rate and regular rhythm.     Heart sounds: Normal heart sounds. No murmur heard.    No gallop.     Comments: Trace edema at knee b/l.  L>R LE edema, 1+ at ankle b/l. Pulmonary:     Effort: Pulmonary effort is normal.  No respiratory distress.     Breath sounds: Normal breath sounds. No wheezing, rhonchi or rales.  Skin:    General: Skin is warm and dry.  Neurological:     Mental Status: She is alert and oriented to person, place, and time.     No results found for any visits on 02/21/24.    Assessment & Plan:  Essential hypertension -     amLODIPine Besylate; Take a 2.5 mg tab with a 5 mg tab (for a total daily dose of 7.5 mg)  Dispense: 90 tablet; Refill: 3  Type 2 diabetes mellitus with stage 3b chronic kidney disease, with long-term current use of insulin (HCC)  Mixed hyperlipidemia  Hyperparathyroidism (HCC)  Stage 3b chronic kidney disease (HCC)  Patient seen for acute elevation in BP.  Will increase Norvasc from 5 mg to 7.5 mg daily.  Patient to continue nebivolol 10 mg daily.  Continue monitoring blood pressure daily.  If readings become too low/hypotension return to 5 mg dosing.  7.8 on 01/27/2024.  Continue current medications.  Continue Synthroid 50 mcg daily.  Cardiology and nephrology.  Return in about 6 weeks (around 04/03/2024), or if symptoms worsen or fail to improve, for blood pressure.   Deeann Saint, MD

## 2024-03-01 ENCOUNTER — Ambulatory Visit

## 2024-03-02 NOTE — Progress Notes (Signed)
 Shoes too small re-ordered in another SZ larger and set another appt for 4/22 ppw expires 04/07/24  Bonnie Chavez Cped, CFo,CFm

## 2024-03-03 NOTE — Progress Notes (Signed)
 Discharge Progress Report  Patient Details  Name: Bonnie Chavez MRN: [id number] Date of Birth: 1934/06/02 Referring Provider:   Flowsheet Row INTENSIVE CARDIAC REHAB from 11/18/2023 in [facility]  Referring Provider Bonnie Guadalajara, MD        Number of Visits: 72  Reason for Discharge:  Patient reached a stable level of exercise. Patient independent in their exercise. Patient has met program and personal goals.  Smoking History:  Social History   Tobacco Use  Smoking Status Never  Smokeless Tobacco Never    Diagnosis:  10/05/23 TAVR  ADL UCSD:   Initial Exercise Prescription:  Initial Exercise Prescription - 11/18/23 1200       Date of Initial Exercise RX and Referring Provider   Date 11/18/23    Referring Provider Bonnie Guadalajara, MD    Expected Discharge Date 02/02/24      NuStep   Level 3    SPM 75    Minutes 25    METs 1      Prescription Details   Frequency (times per week) 3    Duration Progress to 30 minutes of continuous aerobic without signs/symptoms of physical distress      Intensity   THRR 40-80% of Max Heartrate 53-106    Ratings of Perceived Exertion 11-13    Perceived Dyspnea 0-4      Progression   Progression Continue progressive overload as per policy without signs/symptoms or physical distress.      Resistance Training   Training Prescription Yes    Weight 2 lbs    Reps 10-15             Discharge Exercise Prescription (Final Exercise Prescription Changes):  Exercise Prescription Changes - 02/18/24 1500       Response to Exercise   Blood Pressure (Admit) 140/70    Blood Pressure (Exit) 124/78    Heart Rate (Admit) 57 bpm    Heart Rate (Exercise) 79 bpm    Heart Rate (Exit) 45 bpm    Rating of Perceived Exertion (Exercise) 11    Symptoms None    Comments Pt graduated from the CRP2 program today.    Duration Continue with 30 min of aerobic exercise without  signs/symptoms of physical distress.    Intensity THRR unchanged      Progression   Progression Continue to progress workloads to maintain intensity without signs/symptoms of physical distress.    Average METs 2.3      Resistance Training   Training Prescription Yes    Weight 3 lbs    Reps 10-15    Time 10 Minutes      Interval Training   Interval Training No      NuStep   Level 3    SPM 78    Minutes 30    METs 2.3      Home Exercise Plan   Plans to continue exercise at Home (comment)    Frequency Add 2 additional days to program exercise sessions.    Initial Home Exercises Provided 12/31/23             Functional Capacity:  6 Minute Walk     Row Name 11/18/23 1200 01/26/24 1301       6 Minute Walk   Phase Initial  Used Go-cart Discharge    Distance 970 feet 1221 feet    Distance % Change -- 25.9 %    Distance Feet Change -- 251 ft  Walk Time 6 minutes 6 minutes    # of Rest Breaks 0 0    MPH 1.84 2.31    METS 1 1.42    RPE 11 11    Perceived Dyspnea  0 1    VO2 Peak 3.22 5    Symptoms No Yes (comment)    Comments -- Mild SOB, RPD = 1    Resting HR 65 bpm 63 bpm    Resting BP 140/64 140/62    Resting Oxygen Saturation  100 % --    Exercise Oxygen Saturation  during 6 min walk 98 % --    Max Ex. HR 76 bpm 89 bpm    Max Ex. BP 152/78 140/70    2 Minute Post BP 134/70 --             Psychological, QOL, Others - Outcomes: PHQ 2/9:    02/21/2024    8:36 AM 02/18/2024    1:26 PM 01/20/2024   10:33 AM 11/11/2023   11:49 AM 09/08/2023    9:48 AM  Depression screen PHQ 2/9  Decreased Interest 0 0 0 0 0  Down, Depressed, Hopeless 0 0 0 0 0  PHQ - 2 Score 0 0 0 0 0  Altered sleeping 0 0 0 0 0  Tired, decreased energy 0 0 0 0 3  Change in appetite 0 0 0 0 0  Feeling bad or failure about yourself  0 0 0 0 0  Trouble concentrating 0 0 0 0 0  Moving slowly or fidgety/restless 0 0 0 0 0  Suicidal thoughts 0 0 0 0 0  PHQ-9 Score 0 0 0 0 3   Difficult doing work/chores  Not difficult at all Not difficult at all Not difficult at all Somewhat difficult    Quality of Life:  Quality of Life - 02/08/24 0944       Quality of Life Scores   Health/Function Pre 24.47 %    Health/Function Post 25.9 %    Health/Function % Change 5.84 %    Socioeconomic Pre 25.79 %    Socioeconomic Post 27.25 %    Socioeconomic % Change  5.66 %    Psych/Spiritual Pre 28.29 %    Psych/Spiritual Post 30 %    Psych/Spiritual % Change 6.04 %    Family Pre 26.3 %    Family Post 28.8 %    Family % Change 9.51 %    GLOBAL Pre 26.24 %    GLOBAL Post 27.44 %    GLOBAL % Change 4.57 %             Personal Goals: Goals established at orientation with interventions provided to work toward goal.  Personal Goals and Risk Factors at Admission - 11/18/23 1031       Core Components/Risk Factors/Patient Goals on Admission   Diabetes Yes    Intervention Provide education about signs/symptoms and action to take for hypo/hyperglycemia.;Provide education about proper nutrition, including hydration, and aerobic/resistive exercise prescription along with prescribed medications to achieve blood glucose in normal ranges: Fasting glucose 65-99 mg/dL    Expected Outcomes Short Term: Participant verbalizes understanding of the signs/symptoms and immediate care of hyper/hypoglycemia, proper foot care and importance of medication, aerobic/resistive exercise and nutrition plan for blood glucose control.;Long Term: Attainment of HbA1C < 7%.    Hypertension Yes    Intervention Provide education on lifestyle modifcations including regular physical activity/exercise, weight management, moderate sodium restriction and increased consumption of fresh fruit,  vegetables, and low fat dairy, alcohol moderation, and smoking cessation.;Monitor prescription use compliance.    Expected Outcomes Short Term: Continued assessment and intervention until BP is < 140/70mm HG in hypertensive  participants. < 130/75mm HG in hypertensive participants with diabetes, heart failure or chronic kidney disease.;Long Term: Maintenance of blood pressure at goal levels.    Lipids Yes    Intervention Provide education and support for participant on nutrition & aerobic/resistive exercise along with prescribed medications to achieve LDL 70mg , HDL >40mg .    Expected Outcomes Short Term: Participant states understanding of desired cholesterol values and is compliant with medications prescribed. Participant is following exercise prescription and nutrition guidelines.;Long Term: Cholesterol controlled with medications as prescribed, with individualized exercise RX and with personalized nutrition plan. Value goals: LDL < 70mg , HDL > 40 mg.              Personal Goals Discharge:  Goals and Risk Factor Review     Row Name 11/23/23 1345 11/29/23 1430 12/24/23 1056 01/21/24 1331       Core Components/Risk Factors/Patient Goals Review   Personal Goals Review Weight Management/Obesity;Lipids;Hypertension;Diabetes Weight Management/Obesity;Lipids;Hypertension;Diabetes Weight Management/Obesity;Lipids;Hypertension;Diabetes Weight Management/Obesity;Lipids;Hypertension;Diabetes    Review Bonnie Chavez started cardiac rehab on 11/22/23. Bonnie Chavez did well with exercise for her fitness level. Vital signs and CBg's were stable. Bonnie Chavez uses a cane for stability. Bonnie Chavez started cardiac rehab on 11/22/23. Bonnie Chavez is off to a good start with exercise  Vital signs and CBg's have been stable. Bonnie Chavez is doing well  with exercise  at cardiac rehab.  Vital signs and CBg's have been stable. Bonnie Chavez continues to do well  with exercise  at cardiac rehab.  Vital signs and CBg's remain stable. Bonnie Chavez will complete cardiac rehab on 02/04/24.    Expected Outcomes Bonnie Chavez will conitnue to participate in cardiac rehab for exercise, nutrition and lifestyle modifications Bonnie Chavez will conitnue to participate in cardiac rehab for exercise, nutrition and  lifestyle modifications Bonnie Chavez will conitnue to participate in cardiac rehab for exercise, nutrition and lifestyle modifications Bonnie Chavez will conitnue to participate in cardiac rehab for exercise, nutrition and lifestyle modifications             Exercise Goals and Review:  Exercise Goals     Row Name 11/11/23 0924             Exercise Goals   Increase Physical Activity Yes       Intervention Provide advice, education, support and counseling about physical activity/exercise needs.;Develop an individualized exercise prescription for aerobic and resistive training based on initial evaluation findings, risk stratification, comorbidities and participant's personal goals.       Expected Outcomes Short Term: Attend rehab on a regular basis to increase amount of physical activity.;Long Term: Exercising regularly at least 3-5 days a week.;Long Term: Add in home exercise to make exercise part of routine and to increase amount of physical activity.       Increase Strength and Stamina Yes       Intervention Provide advice, education, support and counseling about physical activity/exercise needs.;Develop an individualized exercise prescription for aerobic and resistive training based on initial evaluation findings, risk stratification, comorbidities and participant's personal goals.       Expected Outcomes Short Term: Increase workloads from initial exercise prescription for resistance, speed, and METs.;Short Term: Perform resistance training exercises routinely during rehab and add in resistance training at home;Long Term: Improve cardiorespiratory fitness, muscular endurance and strength as measured by increased METs and functional capacity ( )  Able to understand and use rate of perceived exertion (RPE) scale Yes       Intervention Provide education and explanation on how to use RPE scale       Expected Outcomes Short Term: Able to use RPE daily in rehab to express subjective intensity  level;Long Term:  Able to use RPE to guide intensity level when exercising independently       Knowledge and understanding of Target Heart Rate Range (THRR) Yes       Intervention Provide education and explanation of THRR including how the numbers were predicted and where they are located for reference       Expected Outcomes Short Term: Able to state/look up THRR;Short Term: Able to use daily as guideline for intensity in rehab;Long Term: Able to use THRR to govern intensity when exercising independently       Understanding of Exercise Prescription Yes       Intervention Provide education, explanation, and written materials on patient's individual exercise prescription       Expected Outcomes Short Term: Able to explain program exercise prescription;Long Term: Able to explain home exercise prescription to exercise independently                Exercise Goals Re-Evaluation:  Exercise Goals Re-Evaluation     Row Name 11/22/23 1623 12/22/23 1435 01/21/24 1416 02/09/24 1500 02/18/24 1500     Exercise Goal Re-Evaluation   Exercise Goals Review Increase Physical Activity;Increase Strength and Stamina;Able to understand and use rate of perceived exertion (RPE) scale;Knowledge and understanding of Target Heart Rate Range (THRR);Understanding of Exercise Prescription Increase Physical Activity;Increase Strength and Stamina;Able to understand and use rate of perceived exertion (RPE) scale;Knowledge and understanding of Target Heart Rate Range (THRR);Understanding of Exercise Prescription Increase Physical Activity;Increase Strength and Stamina;Able to understand and use rate of perceived exertion (RPE) scale;Knowledge and understanding of Target Heart Rate Range (THRR);Understanding of Exercise Prescription Increase Physical Activity;Increase Strength and Stamina;Able to understand and use rate of perceived exertion (RPE) scale;Knowledge and understanding of Target Heart Rate Range (THRR);Understanding of  Exercise Prescription Increase Physical Activity;Increase Strength and Stamina;Able to understand and use rate of perceived exertion (RPE) scale;Knowledge and understanding of Target Heart Rate Range (THRR);Understanding of Exercise Prescription   Comments Pt's first day in the CRP2 program. Pt understnads the Exercise Rx, RPE scale, and THRR. Reviewed METs and goals today. Pt voices progress on her goals of imrpoved strength and stamina. Pt reports at home that she is able to stand at the kitchen sink for a longer period of time. She also voices that she is napping less. Reviewed METs and goals today. Pt continue to voice progress on her goals of improved strength and stamina. Pt reports at home that she is able to stand at the kitchen sink for a longer period of time. She also voices that she coninutes to  nap less. Pt voices she feels the exercise is helping her remain independent with her ADL's, and this was a goal for her. Reviewed METs and goals today. Pt continues to voice progress on her goals of improved strength and stamina. Pt reports at home that she is able to stand at the kitchen sink for a longer period of time. Pt voices she feels the exercise is helping her remain independent with her ADL's, and this was a goal for her. Pt graduated from the CRP2 program today. Pt met her goals of improving her strength and stamina. ADL's are easier since being in the  exercise program. Pt has been exercisin at home on her off days and plans to go to the [facility] and use the nustep and chair exercises.   Expected Outcomes Will continue to montior and progress exercise workloads as tolerated. Will continue to montior and progress exercise workloads as tolerated. Will continue to montior and progress exercise workloads as tolerated. Will continue to montior and progress exercise workloads as tolerated. Pt will continue to exercise at her home/[facility].            Nutrition & Weight - Outcomes:  Pre Biometrics -  11/18/23 1015       Pre Biometrics   Waist Circumference 44.5 inches    Hip Circumference 46.25 inches    Waist to Hip Ratio 0.96 %    Triceps Skinfold 45 mm    % Body Fat 49.5 %    Grip Strength 13 kg    Flexibility --   No performed due to back issues   Single Leg Stand 1.2 seconds             Post Biometrics - 01/26/24 1629        Post  Biometrics   Height 5\' 3"  (1.6 m)    Weight 84.9 kg    Waist Circumference 43 inches    Hip Circumference 46 inches    Waist to Hip Ratio 0.93 %    BMI (Calculated) 33.16    Triceps Skinfold 38 mm    % Body Fat 48 %    Grip Strength 16 kg    Single Leg Stand 2 seconds             Nutrition:  Nutrition Therapy & Goals - 02/18/24 1419       Nutrition Therapy   Diet Heart Healthy Diet    Drug/Food Interactions Statins/Certain Fruits      Personal Nutrition Goals   Nutrition Goal Patient to identify strategies for reducing cardiovascular risk by attending the [facility] education and nutrition series weekly.   goal in action.   Personal Goal #2 Patient to improve diet quality by using the plate method as a guide for meal planning to include lean protein/plant protein, fruits, vegetables, whole grains, nonfat dairy as part of a well-balanced diet.   goal in progress.   Comments Goals in progress. Bonnie Chavez has medical history of DM2, CKD3, HTN, s/p TAVR. She attended the [facility] education series regularly. HTN well controlled (norvasc, lasix) and continues follow-up with PCP, cardiology. A1c is above goal (7.6). She has maintained her weight since starting our program. Patient will continue to benefit from adherance to nutrition, exercise, and lifestyle modification recommendations.      Intervention Plan   Intervention Prescribe, educate and counsel regarding individualized specific dietary modifications aiming towards targeted core components such as weight, hypertension, lipid management, diabetes, heart failure and other  comorbidities.;Nutrition handout(s) given to patient.    Expected Outcomes Short Term Goal: Understand basic principles of dietary content, such as calories, fat, sodium, cholesterol and nutrients.;Long Term Goal: Adherence to prescribed nutrition plan.             Nutrition Discharge:   Education Questionnaire Score:  Knowledge Questionnaire Score - 02/08/24 0944       Knowledge Questionnaire Score   Post Score 24/24             Goals reviewed with patient; copy given to patient.Pt graduates from  Intensive/Traditional cardiac rehab program on 02/18/24 with completion of  36 exercise and  36 education sessions. Pt maintained good attendance and progressed nicely during their participation in rehab as evidenced by increased MET level. Genessis increased her distance on her post exercise walk test by 251 feet.   Medication list reconciled. Repeat  PHQ score-  0.  Pt has made significant lifestyle changes and should be commended for her  success. Bonnie Chavez  achieved her goals during cardiac rehab.   Pt plans to continue exercise at the [facility] and exercise at home. We are proud of Bonnie Chavez's progress!Bonnie Headings RN BSN

## 2024-03-07 ENCOUNTER — Other Ambulatory Visit: Payer: Self-pay | Admitting: Cardiovascular Disease

## 2024-03-14 ENCOUNTER — Ambulatory Visit: Payer: Medicare HMO | Attending: Cardiovascular Disease | Admitting: Cardiovascular Disease

## 2024-03-14 ENCOUNTER — Encounter: Payer: Self-pay | Admitting: Cardiovascular Disease

## 2024-03-14 ENCOUNTER — Ambulatory Visit

## 2024-03-14 VITALS — BP 132/84 | HR 69 | Ht 63.0 in | Wt 185.6 lb

## 2024-03-14 DIAGNOSIS — I441 Atrioventricular block, second degree: Secondary | ICD-10-CM

## 2024-03-14 DIAGNOSIS — Z952 Presence of prosthetic heart valve: Secondary | ICD-10-CM

## 2024-03-14 DIAGNOSIS — I10 Essential (primary) hypertension: Secondary | ICD-10-CM | POA: Diagnosis not present

## 2024-03-14 DIAGNOSIS — I350 Nonrheumatic aortic (valve) stenosis: Secondary | ICD-10-CM

## 2024-03-14 DIAGNOSIS — E785 Hyperlipidemia, unspecified: Secondary | ICD-10-CM | POA: Diagnosis not present

## 2024-03-14 DIAGNOSIS — M25473 Effusion, unspecified ankle: Secondary | ICD-10-CM

## 2024-03-14 MED ORDER — NEBIVOLOL HCL 10 MG PO TABS: ORAL_TABLET | ORAL | 3 refills | Status: DC

## 2024-03-14 NOTE — Progress Notes (Signed)
 Shoes [id number] still on order

## 2024-03-14 NOTE — Patient Instructions (Signed)
 Medication Instructions:  No Changes  Lab Work: None Ordered  Testing/Procedures: Keep scheduled appointment for Echocardiogram on 09/29/24  Follow-Up: At [facility], you and your health needs are our priority.  As part of our continuing mission to provide you with exceptional heart care, our providers are all part of one team.  This team includes your primary Cardiologist (physician) and Advanced Practice Providers or APPs (Physician Assistants and Nurse Practitioners) who all work together to provide you with the care you need, when you need it.  Your next appointment:   8 month(s)  Provider:   Arnoldo Lapping, MD   We recommend signing up for the patient portal called "MyChart".  Sign up information is provided on this After Visit Summary.  MyChart is used to connect with patients for Virtual Visits (Telemedicine).  Patients are able to view lab/test results, encounter notes, upcoming appointments, etc.  Non-urgent messages can be sent to your provider as well.   To learn more about what you can do with MyChart, go to [email or web address].         1st Floor: - Lobby - Registration  - Pharmacy  - Lab - Cafe  2nd Floor: - PV Lab - Diagnostic Testing (echo, CT, nuclear med)  3rd Floor: - Vacant  4th Floor: - TCTS (cardiothoracic surgery) - AFib Clinic - Structural Heart Clinic - Vascular Surgery  - Vascular Ultrasound  5th Floor: - [facility] (general and EP) - Clinical Pharmacy for coumadin, hypertension, lipid, weight-loss medications, and med management appointments    Valet parking services will be available as well.

## 2024-03-14 NOTE — Progress Notes (Signed)
 Patient ID: Stephen Turnbaugh, female   DOB: [birth year], 89 y.o.   MRN: [id number]       HPI: Ragna Kramlich is a 89 y.o. female presents to the office today for a 6 month cardiology evaluation.  Ms. Barrette has a history of hypertension with documented grade 1 diastolic dysfunction, hyperlipidemia, type 2 diabetes mellitus with renal insufficiency, as well as aortic valve sclerosis without stenosis. An echo Doppler study in 2011 showed an EF of 55%.  In 2011 a nuclear perfusion scan showed normal perfusion and function.  She has a history of a goiter for which she sees Dr. Sherline Distel, [name] for her renal insufficiency, Dr. Coy Ditty for uterine polyps and fibroids.  She has a history of hyperparathyroidism and saw Dr. Welton Hall; she has not had surgery in his undergoing continued surveillance of her calcium  levels.  She underwent a five-year follow-up echo Doppler study on 08/23/2015.  This continued to show normal systolic function with mild LVH and grade 1 diastolic dysfunction.  There was a 10 mm mean gradient and 22 mm peak gradient across her moderately calcified aortic valve with a valve area of 1.63 cm.    She has a long-standing history of diabetes mellitus  and sees Dr. Ballin.  She sees Dr. Rolande Cleverly for renal insufficiency.  I reviewed her recent blood work from his office 03/09/2017 which showed her creatinine had improved to 1.5.  In March 2018 Cr was 1.63 and in December 2017 Cr was 1.83.    When I saw her in May 2018, she had had subsequent blood work which had revealed improvement in creatinine to 1.5.  I recommended she undergo a 2 year follow-up echo Doppler evaluation of her aortic stenosis.  This was done on 08/10/2007 and showed an EF of 65-70%.  Her mean aortic gradient has increased to 16 and her peak gradient 30.  There was grade 1 diastolic dysfunction, mild MR, mild LA dilation and mild TR.  Her estimated aortic valve area is 1.58 cm.  She denied any chest pain, presyncope or  syncope.    I saw her in October 2018.  She has continued to be followed by Dr. Rosco Companion of endocrinology and Dr. Rolande Cleverly of nephrology.  She has renal insufficiency and her creatinine in August 2018 was 1.7.  Her blood pressure was stable and I recommended reduction of her furosemide  to 20 mg from her prior dose of 40 mg.  Any chest pain.  She denies presyncope or syncope.  She tells me her recent hemoglobin A1c was 7.6.  She is  followed by Dr. Sherline Distel of endocrinology and Dr. Rolande Cleverly of nephrology. A repeat creatinine in August 2018 was 1.7.    I saw her in the office for evaluation in November 2019.  At that time she denied any chest pain, shortness of breath, presyncope or syncope.  She underwent a repeat echo Doppler study on September 02, 2018 which continued to show normal systolic function with an EF of 55 to 60%, grade 1 diastolic dysfunction, moderate aortic stenosis with a mean gradient of 23 and peak gradient of 37 and mitral annular calcification.  Laboratory from Dr. Rolande Cleverly in October 2019 revealed a creatinine of 1.64.    She had a telemedicine evaluation with Dr. Carry Clapper, her primary physician in March 2020 and saw Dr. Rolande Cleverly  and had repeat renal function testing.    I evaluated her on May 12, 2019 in a telemedicine visit. She had remained asymptomatic and specifically denied  any chest pain PND orthopnea, presyncope or syncope.  She denies any palpitations.  At times she notes trivial left ankle edema.    She underwent an echo Doppler study on November 06, 2019 which continued to show an EF of 60 to 65%.  There were no regional wall motion abnormalities.  There was moderate LVH and asymmetric left ventricular hypertrophy.  There was moderate aortic stenosis with a mean gradient of 19 and peak gradient of 36.2.  There was mild PR, trivial MR and TR. I last saw her on November 29, 2019.  At that time she felt well and admitted to just mild episodes of shortness of breath particularly  with walking uphill.  She has had issues with arthritis.  She has continued to see Dr. Ballin for endocrinologic care and now sees Dr. Mannie Seek for nephrology since Dr. Dr. Rolande Cleverly is about to retire.   I saw her in August 2021.  She underwent laboratory on June 24, 2020 by Dr. Sherline Distel which showed a creatinine of 1.42.  She had normal LFTs.  TSH was 1.34.  Total cholesterol 160, triglycerides 74, HDL 48, LDL cholesterol 98.  Laboratory by Dr. Edson Graces has shown mild vitamin D  insufficiency with a vitamin D  level of 23 in April 2021.  Creatinine at that time was further increased at 1.74 which subsequently improved.  She denies any chest tightness.  She denied any dizziness presyncope or syncope.  She continued to experience some mild shortness of breath with uphill walking with this without change.  During that evaluation her blood pressure was stable on amlodipine  5 mg, Bystolic  5 mg twice a day, furosemide  40 mg, and lisinopril  10 mg.  She continued to be on rosuvastatin  40 mg for hyperlipidemia.  She was being followed by Dr. Edson Graces for her stage III chronic kidney disease.  I recommended that she undergo a follow-up echo Doppler study for reassessment of her aortic valve prior to her next cardiac evaluation.  On February 21, 2021, an echo Doppler study showed normal LV function with EF 60 to 65%, moderate left ventricular hypertrophy and grade 2 diastolic dysfunction.  She had elevated left atrial pressure.  She was felt to have at least moderate least severe aortic stenosis.  Her mean gradient was 24 mm, peak gradient 40.8 mm and aortic valve area at 0.9 cm.  I  saw her on Mar 25, 2021 at which time she remained stable but had experienced some mild shortness of breath with uphill walking.  She typically walks with a cane.  She is unaware of any presyncope or syncope.  She denies any leg edema.  She had recently seen Dr. Edson Graces of nephrology and laboratory was obtained in January 2022.  Creatinine was  1.51 with her estimated GFR at 36 consistent with stage IIIb CKD.    She underwent her wellness visit on November 05, 2021 with her primary provider.  She had a follow-up echo Doppler study in November 25, 2020 which showed an EF of 60 to 65% with mild concentric LVH and grade 1 diastolic dysfunction.  Her aortic valve was trileaflet with severe calcification.  There was moderate aortic stenosis with a mean gradient at 20 and peak gradient of 35.6 mmHg.  Aortic valve area was 1.6 cm.    I saw her on December 04, 2021.  At that time she continued to deny any chest pain.  She walks with a cane.   [facility].  She has CKD and continues to see Dr. Sharlon Deacon a and her creatinine in November 2022 was 1.8.  She denies chest pain, presyncope or syncope, or any significant dyspnea.  At that time she was asymptomatic with reference to her aortic stenosis.  I recommended that in October 2023 she have a follow-up evaluation for reassessment.  She underwent a 2D echo Doppler study on September 03, 2022.  This showed normal LV function with EF 60 to 65%.  There was grade 1 diastolic dysfunction.  RV pressure was normal.  She was felt to have moderately severe AAS and her mean gradient had increased from 20 mg to 24 mg with her peak gradient now at 36 mm.  When I saw her her on November 30, 2022 Ms. Brownfield remained asymptomatic.  She denied chest pain presyncope or syncope.  At times she admits to mild shortness of breath with exertion.  [facility] several days per week.  She has also seen Dr. Edson Graces with creatinine 1.4 which has been fairly stable.  She is unaware of palpitations.  She presents for reevaluation.  She underwent a follow-up echo Doppler study on Mar 24, 2023 which showed normal LV function with EF 60 to 65%, grade 2 diastolic dysfunction.  Mild LVH. Aortic valve stenosis with mean gradient of 32 peak gradient 53.3 estimated valve area  0.72cm2.  She has remained asymptomatic and close follow-up was recommended.  She was evaluated by Dr. Arnoldo Lapping on June 01, 2023 for her aortic stenosis and subsequently was referred for cardiac catheterization on August 16, 2023.  Catheterization revealed patent coronary arteries, right dominant with moderate coronary calcification and plaque but without significant obstructive disease.  Her aortic valve was calcified and restricted and was unable to be crossed with a J-wire and JR4 catheter.  On August 30, 2023 she underwent coronary CTA which showed a calcium  score 1576.  There is annular measurement support of a 23 mm S3 or 26 mm evolute pro.  She has seen Dr. Janeth Medicus and initially was given several dates to undergo TAVR.  She tells me now this is set for most likely October 05, 2023.  She currently denies any chest pain.  She does experience some occasional palpitations.  She is on amlodipine  5 mg, furosemide  20 mg, nebivolol  5 mg twice a day for hypertension, levothyroxine  50 mcg for hypothyroidism, and rosuvastatin  40 mg for hyperlipidemia.    She underwent successful TAVR ER with a 23 mm Edwards SAPIEN 3 ultra Resilia THV bleeding transfemoral approach on October 05, 2023 by Drs. Bartle and [employer].  Postoperative initial echo showed EF 65% with mild LVH, and a normal functioning TAVR with mean gradient 11.2 without PVL as well as mild MR.  Postoperatively she did have questionable 2-1 AV block and sinus pause.  Subsequently, she has been seen by Darilyn Edin, PA-C in November and most recently on November 05, 2019.  Presently, she feels well.  She does experience some lower extremity ankle swelling left greater than right.  She has been taking amlodipine  which had been increased by her primary provider to 7.5 mg due to elevated blood pressure.  She is taking furosemide  40 mg daily and takes Nebivolol  10 mg in the morning and 5 mg at night.  She is on levothyroxine  50 mcg for  hypothyroidism.  She is on rosuvastatin  40 mg and Humulin insulin  in addition to gabapentin .  She is able to  ambulate more without her previous shortness of breath.  She presents for evaluation.      Past Medical History:  Diagnosis Date   (HFpEF) heart failure with preserved ejection fraction (HCC)    Anemia    History of , resolved   Carpal tunnel syndrome    Chronic low back pain 02/27/2016   -DDD -seeing [facility]    CKD stage 3b, GFR 30-44 ml/min (HCC)    Diabetes mellitus    type 2, sees Dr. Ronelle Coffee   Goiter    Dr. Ronelle Coffee   History of blood transfusion    auto transfusion post knee replacement   Hyperlipidemia    Hyperparathyroidism (HCC) 02/27/2016   -sees endocrinologist, Dr. Ronelle Coffee, has seen surgeon as well    Hypertension    Migraine    no longer has migraines (80's) after menopause   Mitral valve prolapse    Neuropathy    Osteoarthritis    DDD, sees [facility]   PMB (postmenopausal bleeding) 02/28/2009   Resolved   S/P TAVR (transcatheter aortic valve replacement) 10/05/2023   s/p TAVR with a 23 mm Edwards S3UR via the TF approach by Dr. Arlester Ladd and Sherene Dilling   Severe aortic stenosis    Vaginal atrophy    Vitamin D  deficiency     Past Surgical History:  Procedure Laterality Date   CARPAL TUNNEL RELEASE Right 04/30/2023   Procedure: RIGHT CARPAL TUNNEL RELEASE;  Surgeon: Brunilda Capra, MD;  Location: [facility] OR;  Service: Orthopedics;  Laterality: Right;  30 MIN   CESAREAN SECTION  1972 &1976   CHOLECYSTECTOMY  1991   COLONOSCOPY     CORONARY ANGIOGRAPHY N/A 08/16/2023   Procedure: CORONARY ANGIOGRAPHY;  Surgeon: Arnoldo Lapping, MD;  Location: [facility] INVASIVE CV LAB;  Service: Cardiovascular;  Laterality: N/A;   DILATATION & CURRETTAGE/HYSTEROSCOPY WITH RESECTOCOPE N/A 05/15/2013   Procedure: DILATATION & CURETTAGE/HYSTEROSCOPY WITH RESECTOCOPE;  Surgeon: Stevenson Elbe, MD;  Location: [facility] ORS;  Service: Gynecology;  Laterality: N/A;   DILATATION &  CURRETTAGE/HYSTEROSCOPY WITH RESECTOCOPE N/A 06/03/2017   Procedure: DILATATION & CURETTAGE/HYSTEROSCOPY;  Surgeon: Stevenson Elbe, MD;  Location: [facility] ORS;  Service: Gynecology;  Laterality: N/A;   DILATION AND CURETTAGE OF UTERUS  1975   HEMORRHOID SURGERY  1997   KNEE SURGERY  2000 2011   right knee replacement    REPLACEMENT TOTAL KNEE  2000   left   SHOULDER SURGERY  2004 2010    TRANSTHORACIC ECHOCARDIOGRAM  07/2010   EF=>55%; LA mildly dilated; trace MR/TR;   TUBAL LIGATION  1976    Allergies  Allergen Reactions   Lisinopril  Hives    Cough, hives, and lip edema    Nsaids Other (See Comments)    Abnormal renal function   Penicillins Other (See Comments) and Rash    Pt has taken Keflex without difficulty  Has patient had a PCN reaction causing immediate rash, facial/tongue/throat swelling, SOB or lightheadedness with hypotension: yes  Has patient had a PCN reaction causing severe rash involving mucus membranes or skin necrosis: no  Has patient had a PCN reaction that required hospitalization no  Has patient had a PCN reaction occurring within the last 10 years: no  If all of the above answers are "NO", then may proceed with Cephalosporin use.    Current Outpatient Medications  Medication Sig Dispense Refill   acetaminophen  (TYLENOL ) 500 MG tablet Take 500 mg by mouth every 6 (six) hours as needed (Arthritis).     amLODipine  (NORVASC ) 2.5  MG tablet Take a 2.5 mg tab with a 5 mg tab (for a total daily dose of 7.5 mg) 90 tablet 3   amLODipine  (NORVASC ) 5 MG tablet Take 1 tablet (5 mg total) by mouth daily. 30 tablet 6   aspirin  81 MG EC tablet Take 81 mg by mouth at bedtime. Swallow whole.     azithromycin  (ZITHROMAX ) 500 MG tablet Take 1 tablet (500 mg total) 1 hour prior to dental appointments 3 tablet 3   BD PEN NEEDLE NANO 2ND GEN 32G X 4 MM MISC USE AS DIRECTED 90     cinacalcet  (SENSIPAR ) 30 MG tablet Take 30 mg by mouth every Monday, Wednesday, and Friday.      furosemide  (LASIX ) 20 MG tablet Take 40 mg by mouth every morning. Can take additional tablet if needed 30 tablet    gabapentin  (NEURONTIN ) 100 MG capsule Take 100 mg by mouth at bedtime.     HUMULIN 70/30 KWIKPEN (70-30) 100 UNIT/ML KwikPen Inject 4-8 Units into the skin See admin instructions. 4 units in the AM and 8 units in the pm     levothyroxine  (SYNTHROID ) 50 MCG tablet Take 50 mcg by mouth daily before breakfast.     OneTouch Delica Lancets 33G MISC Apply topically.     ONETOUCH VERIO test strip 1 each 2 (two) times daily.     rosuvastatin  (CRESTOR ) 40 MG tablet TAKE 1 TABLET BY MOUTH EVERYDAY AT BEDTIME 90 tablet 2   Trolamine Salicylate (BLUE-EMU HEMP EX) Apply 1 application  topically daily as needed (pain).     nebivolol  (BYSTOLIC ) 10 MG tablet Take one tablet (10 mg) in the morning and half a tablet ( 5 mg) at night 60 tablet 3   No current facility-administered medications for this visit.    Social History   Socioeconomic History   Marital status: Divorced    Spouse name: Not on file   Number of children: 2   Years of education: Not on file   Highest education level: Not on file  Occupational History   Not on file  Tobacco Use   Smoking status: Never   Smokeless tobacco: Never  Vaping Use   Vaping status: Never Used  Substance and Sexual Activity   Alcohol use: No    Alcohol/week: 0.0 standard drinks of alcohol   Drug use: No   Sexual activity: Not on file  Other Topics Concern   Not on file  Social History Narrative   Work or School: none      Home Situation: lives alone, feels safe, son lives nearby      Spiritual Beliefs: Christian      Lifestyle: Y 3x per week, healthy diet       01/05/2019: Lives alone in one level house.   Goes to [employer] to do water aerobics 3X/week   Has son and daughter, son local      Social Drivers of Health   Financial Resource Strain: Low Risk  (11/06/2022)   Overall Financial Resource Strain (CARDIA)    Difficulty of  Paying Living Expenses: Not hard at all  Food Insecurity: No Food Insecurity (10/05/2023)   Hunger Vital Sign    Worried About Running Out of Food in the Last Year: Never true    Ran Out of Food in the Last Year: Never true  Transportation Needs: No Transportation Needs (10/05/2023)   PRAPARE - Administrator, Civil Service (Medical): No    Lack of Transportation (Non-Medical): No  Physical Activity: Insufficiently Active (11/06/2022)   Exercise Vital Sign    Days of Exercise per Week: 2 days    Minutes of Exercise per Session: 40 min  Stress: No Stress Concern Present (11/06/2022)   [employer] of Occupational Health - Occupational Stress Questionnaire    Feeling of Stress : Not at all  Social Connections: Moderately Integrated (11/06/2022)   Social Connection and Isolation Panel [NHANES]    Frequency of Communication with Friends and Family: More than three times a week    Frequency of Social Gatherings with Friends and Family: More than three times a week    Attends Religious Services: More than 4 times per year    Active Member of [employer] or Organizations: Yes    Attends Banker Meetings: More than 4 times per year    Marital Status: Divorced  Intimate Partner Violence: Not At Risk (10/05/2023)   Humiliation, Afraid, Rape, and Kick questionnaire    Fear of Current or Ex-Partner: No    Emotionally Abused: No    Physically Abused: No    Sexually Abused: No   Social history is notable that she's divorced has 2 children one grandchild. She does exercise. There is no tobacco or alcohol use..  Family History  Problem Relation Age of Onset   Hypertension Mother    Diabetes Mother    Hyperlipidemia Mother    Heart attack Mother    Heart disease Father    Heart attack Father    Stroke Brother    Heart disease Brother    Diabetes Brother    Uterine cancer Maternal Grandmother     ROS General: Negative; No fevers, chills, or night sweats; No  significant change in weight HEENT: Negative; No changes in vision or hearing, sinus congestion, difficulty swallowing Pulmonary: Negative; No cough, wheezing, shortness of breath, hemoptysis Cardiovascular: See history of present illness GI: Negative; No nausea, vomiting, diarrhea, or abdominal pain GU: Negative; No dysuria, hematuria, or difficulty voiding Musculoskeletal: Negative; no myalgias, joint pain, or weakness Hematologic/Oncology: Negative; no easy bruising, bleeding Endocrine: Positive for diabetes mellitus, positive for hyperparathyroidism Neuro: Negative; no changes in balance, headaches Skin: Negative; No rashes or skin lesions Psychiatric: Negative; No behavioral problems, depression Sleep: Negative; No snoring, daytime sleepiness, hypersomnolence, bruxism, restless legs, hypnogognic hallucinations, no cataplexy Other comprehensive 14 point system review is negative.   PE BP 132/84   Pulse 69   Ht 5\' 3"  (1.6 m)   Wt 185 lb 9.6 oz (84.2 kg)   SpO2 98%   BMI 32.88 kg/m    Repeat blood pressure by me was 138/74  Wt Readings from Last 3 Encounters:  03/14/24 185 lb 9.6 oz (84.2 kg)  02/21/24 185 lb 9.6 oz (84.2 kg)  01/26/24 187 lb 2.7 oz (84.9 kg)   General: Alert, oriented, no distress.  Skin: normal turgor, no rashes, warm and dry HEENT: Normocephalic, atraumatic. Pupils equal round and reactive to light; sclera anicteric; extraocular muscles intact;  Nose without nasal septal hypertrophy Mouth/Parynx benign; Mallinpatti scale 3 Neck: No JVD, no carotid bruits; normal carotid upstroke Lungs: clear to ausculatation and percussion; no wheezing or rales Chest wall: without tenderness to palpitation Heart: PMI not displaced, RRR, s1 s2 normal, 1/6 systolic murmur, no diastolic murmur, no rubs, gallops, thrills, or heaves Abdomen: soft, nontender; no hepatosplenomehaly, BS+; abdominal aorta nontender and not dilated by palpation. Back: no CVA tenderness Pulses  2+ Musculoskeletal: full range of motion, normal strength, no joint deformities Extremities: 1+  left ankle, trace right ankle lower extremity edema.  She is wearing support stockings.  No clubbing cyanosis or edema, Homan's sign negative  Neurologic: grossly nonfocal; Cranial nerves grossly wnl Psychologic: Normal mood and affect    EKG Interpretation Date/Time:  Tuesday March 14 2024 13:47:16 EDT Ventricular Rate:  69 PR Interval:    QRS Duration:  84 QT Interval:  370 QTC Calculation: 396 R Axis:   -28  Text Interpretation: Sinus tachycardia with 2nd degree A-V block (Mobitz I) Moderate voltage criteria for LVH, may be normal variant ( R in aVL , Cornell product ) When compared with ECG of 11-Nov-2023 10:40, Sinus rhythm is now with 2nd degree A-V block (Mobitz I) Vent. rate has decreased BY  41 BPM Confirmed by Magnus Schuller ([id number]) on 03/14/2024 1:54:36 PM    January 8,2024 ECG (independently read by me): Sinus rhythm at 81, PACs, PVC, 1st degree AV block  December 04, 2021 ECG (independently read by me): Sinus rhythm at 65, 1st degree AV block; PR 280 msec, PACs, LVH  August 2021 ECG (independently read by me): Normal sinus rhythm at 60 bpm.  First-degree AV block with a PR interval at 258 ms.  LVH by voltage in aVL.  No ectopy.  January 2021 ECG (independently read by me): Normal sinus rhythm at 61 bpm with first-degree AV block; PR interval 258 ms.  LVH by voltage criteria in aVL.    November 2019 ECG (independently read by me): Normal sinus rhythm at 61 bpm.  First-degree AV block.  LVH by voltage criteria.  October 2018 ECG (independently read by me): Sinus bradycardia 57 bpm with first-degree AV block.  LVH by voltage criteria.  PR interval 270 ms.  May 2018 ECG (independently read by me): sinus rhythm with first-degree AV block.  LVH by voltage criteria.  PR interval 250 ms.  April 2017 ECG (independently read by me): Normal sinus rhythm at 61 bpm with first-degree AV block  with a PR interval of 244 ms.  LVH by voltage.  September 2016 ECG (independently read by me): Normal sinus rhythm at 60 bpm.  First degree block with a PR interval at 252 ms.  No significant ST-T changes.  October 2015 ECG (independently read by me): Sinus bradycardia at 57 beats per minute.  First degree AV block with a PR interval of 236 ms   September 2014 ECG: Sinus rhythm at 60 beats per minute. First grade AV block with PR interval of 244 ms. QTC interval 356 ms  LABS:    Latest Ref Rng & Units 10/06/2023    4:00 AM 10/05/2023   10:46 AM 10/01/2023    9:30 AM  BMP  Glucose 70 - 99 mg/dL 92  621  308   BUN 8 - 23 mg/dL 28  31  40   Creatinine 0.44 - 1.00 mg/dL 6.57  8.46  9.62   Sodium 135 - 145 mmol/L 138  142  139   Potassium 3.5 - 5.1 mmol/L 3.9  3.9  4.2   Chloride 98 - 111 mmol/L 110  106  103   CO2 22 - 32 mmol/L 23   25   Calcium  8.9 - 10.3 mg/dL 9.5   95.2       Latest Ref Rng & Units 10/01/2023    9:30 AM 11/03/2022   12:00 AM 10/06/2021   12:00 AM  Hepatic Function  Total Protein 6.5 - 8.1 g/dL 8.2     Albumin 3.5 - 5.0 g/dL  3.5  4.2     4.1      AST 15 - 41 U/L 30     ALT 0 - 44 U/L 22     Alk Phosphatase 38 - 126 U/L 73     Total Bilirubin <1.2 mg/dL 0.6        This result is from an external source.      Latest Ref Rng & Units 10/06/2023    4:00 AM 10/05/2023   10:46 AM 10/01/2023    9:30 AM  CBC  WBC 4.0 - 10.5 K/uL 7.5   6.6   Hemoglobin 12.0 - 15.0 g/dL 16.1  09.6  04.5   Hematocrit 36.0 - 46.0 % 32.4  33.0  37.8   Platelets 150 - 400 K/uL 114   156    Lab Results  Component Value Date   MCV 96.7 10/06/2023   MCV 95.5 10/01/2023   MCV 97 08/09/2023   Lab Results  Component Value Date   TSH 1.54 01/27/2024   Lab Results  Component Value Date   HGBA1C 7.6 01/05/2023   Lab Results  Component Value Date   CALCIUM  9.5 10/06/2023   Lipid Panel     Component Value Date/Time   CHOL 173 01/05/2023 0000   TRIG 77 01/05/2023 0000   HDL  60 01/05/2023 0000   CHOLHDL 3 11/04/2017 0819   VLDL 18.0 11/04/2017 0819   LDLCALC 95 11/04/2017 0819    RADIOLOGY:   ECHO: 09/03/2022 IMPRESSIONS   1. The aortic valve is tricuspid. There is severe calcifcation of the  aortic valve. There is severe thickening of the aortic valve. Aortic valve  regurgitation is not visualized. Moderate to severe aortic valve stenosis.  Aortic valve mean gradient  measures 24.0 mmHg. Aortic valve Vmax measures 3.00 m/s. AVA by continuity  is 0.9cm2, DI 0.26, SVi 35.2   2. Left ventricular ejection fraction, by estimation, is 60 to 65%. The  left ventricle has normal function. The left ventricle has no regional  wall motion abnormalities. There is moderate basal-septal hypertrophy. The  rest of the LV segments demonstrate   mild left ventricular hypertrophy. Left ventricular diastolic parameters  are consistent with Grade I diastolic dysfunction (impaired relaxation).  The average left ventricular global longitudinal strain is -16.1 %. The  global longitudinal strain is  abnormal.   3. Right ventricular systolic function is normal. The right ventricular  size is normal. There is normal pulmonary artery systolic pressure. The  estimated right ventricular systolic pressure is 17.6 mmHg.   4. Left atrial size was moderately dilated.   5. The mitral valve is degenerative. Trivial mitral valve regurgitation.  Moderate mitral annular calcification.   6. The inferior vena cava is normal in size with greater than 50%  respiratory variability, suggesting right atrial pressure of 3 mmHg.   Comparison(s): Compared to prior TTE in 11/2021, the AS appears moderate to  severe. Mean gradient increased from to and AVA is now  0.9cm2 with DI 0.26.    ECHO: 03/24/2023  1. Left ventricular ejection fraction, by estimation, is 60 to 65%. Left  ventricular ejection fraction by 3D volume is 62 %. The left ventricle has  normal function. The left  ventricle has no regional wall motion  abnormalities. There is mild left  ventricular hypertrophy. Left ventricular diastolic parameters are  consistent with Grade II diastolic dysfunction (pseudonormalization).  Elevated left atrial pressure.   2. Right ventricular systolic function is normal. The right  ventricular  size is normal. There is normal pulmonary artery systolic pressure. The  estimated right ventricular systolic pressure is 26.0 mmHg.   3. Right atrial size was mildly dilated.   4. The mitral valve is degenerative. Trivial mitral valve regurgitation.  No evidence of mitral stenosis. Moderate mitral annular calcification.   5. The inferior vena cava is normal in size with greater than 50%  respiratory variability, suggesting right atrial pressure of 3 mmHg.   6. The aortic valve is calcified. There is severe calcifcation of the  aortic valve. Aortic valve regurgitation is not visualized. Severe aortic  valve stenosis. Vmax 4.0 m/s, MG , AVA 0.7 cm^2, DI 0.23   CARDIAC CATH: 08/16/2023 1.  Patent coronary arteries, right dominant, with moderate coronary calcification and mild plaquing but no significant obstructive disease 2.  Calcified, restricted aortic valve, unable to cross with a J-wire and JR4 catheter (known severe AS)   Recommendations: Continue TAVR evaluation       IMPRESSION:  1. Primary hypertension   2. Severe aortic stenosis   3. S/P TAVR (transcatheter aortic valve replacement): 10/05/2023   4. Hyperlipidemia with target low density lipoprotein (LDL) cholesterol less than 55 mg/dL   5. Second degree heart block (Mobitz I)   6. Ankle edema     ASSESSMENT AND PLAN: Ms. Cartelli is a 89 year old African-American female who has a long-standing history of hypertension with grade 1 diastolic dysfunction and normal systolic function documented by her echocardiogram in September 2011. At that time she had aortic valve sclerosis without stenosis and  mitral  annular calcification with trace MR and trace TR. over the years she has had progression of her aortic valve disease.  Her echo Doppler study from 08/23/2015 showed an ejection fraction of 55-60%, mild LVH and grade 1 diastolic dysfunction.  There was very mild aortic stenosis with a mean gradient of 10 and peak gradient of 22.  A 2-year follow-up assessment 08/09/2017 continued to show hyperdynamic LV function with slight increase in her mean gradient at 16 mm with peak of 30 and her October 2019 echo demonstrated mild increase in her gradient with a mean gradient of 23 and peak gradient of 37 mmHg. Her echo of November 06, 2019 revealed  normal systolic function and essentially was unchanged with reference to the severity of aortic stenosis.  Her mean gradient was 19 with peak gradient 36.2.  Her most recent a echo of April 2022 now shows a mean gradient of 24 mm with a peak gradient of 40.8 mmHg and AVA at 0.9 cm.  Her echo from November 25, 2020 shows stability and her mean gradient has reduced from 24 > 20 mm Hg with peak gradient from 40.8 down to 35.6. consistent with moderate AS.  Over the years, her aortic stenosis had progressed and her echo  on Mar 24, 2023 with severe aortic stenosis with a mean gradient of 32 peak gradient 53 and estimated valve area of 0.72.  He underwent catheterization by Dr. Arnoldo Lapping and was found to have mild nonobstructive CAD.  She underwent successful TAVR with insertion of a 23 mm Edwards SAPIEN 3 ultra Resolute TH V via the femoral approach.  She had also been noted to have bursts of palpitations and at my last evaluation her Nebivolol  dose had been increased to 10 mg in the morning and 5 mg in the evening.  Presently, her blood pressure is stable at 138/74 on amlodipine  now at increased dose of 7.5 mg every  morning.  She has been taking furosemide  40 mg daily.  She also has been using support stockings.  With her lower extremity edema I have suggested that she can take an  extra dose of Lasix  20 mg in the afternoon as needed if edema persists.  She continues to be on rosuvastatin  40 mg daily for hyperlipidemia.  Clinicaly she is doing well.  She is able to walk more without previous shortness of breath.  She denies any presyncope or syncope.  She is aware of my upcoming retirement.  She is scheduled to undergo a follow-up echo Doppler study in November 2025.  I will transition her to establish with Dr. Arnoldo Lapping in follow-up of the echo.   Millicent Ally, MD, [facility]  03/14/2024 2:31 PM

## 2024-03-27 ENCOUNTER — Ambulatory Visit: Payer: Medicare Other | Admitting: Podiatry

## 2024-03-27 ENCOUNTER — Encounter: Payer: Self-pay | Admitting: Podiatry

## 2024-03-27 DIAGNOSIS — B351 Tinea unguium: Secondary | ICD-10-CM | POA: Diagnosis not present

## 2024-03-27 DIAGNOSIS — M2141 Flat foot [pes planus] (acquired), right foot: Secondary | ICD-10-CM

## 2024-03-27 DIAGNOSIS — M2042 Other hammer toe(s) (acquired), left foot: Secondary | ICD-10-CM | POA: Diagnosis not present

## 2024-03-27 DIAGNOSIS — M2041 Other hammer toe(s) (acquired), right foot: Secondary | ICD-10-CM | POA: Diagnosis not present

## 2024-03-27 DIAGNOSIS — M79674 Pain in right toe(s): Secondary | ICD-10-CM

## 2024-03-27 DIAGNOSIS — M79675 Pain in left toe(s): Secondary | ICD-10-CM

## 2024-03-27 DIAGNOSIS — L84 Corns and callosities: Secondary | ICD-10-CM | POA: Diagnosis not present

## 2024-03-27 DIAGNOSIS — M2142 Flat foot [pes planus] (acquired), left foot: Secondary | ICD-10-CM

## 2024-03-27 DIAGNOSIS — E1149 Type 2 diabetes mellitus with other diabetic neurological complication: Secondary | ICD-10-CM

## 2024-03-27 DIAGNOSIS — M21619 Bunion of unspecified foot: Secondary | ICD-10-CM

## 2024-03-27 NOTE — Progress Notes (Unsigned)
 Subjective: Chief Complaint  Patient presents with   [facility]    RM#[id number] DFC     89 year old female presents for above concerns.  She presents today for thick, elongated nails that she is unable to trim herself.  States that she was getting calluses between her toes which have been doing much better.  Also presents today pick up diabetic shoes.  Bonnie Greulich, MD  Objective: AAO x3, NAD DP/PT pulses palpable bilaterally, CRT less than 3 seconds Nails continue be hypertrophic, dystrophic with yellow, brown discoloration.  There is no edema no erythema.  Tenderness nails 1-5 bilaterally. Hammertoes present.  Minimal callus formation noted along the right medial second toe without any underlying ulceration, drainage or signs of infection. Bunion, hammertoes present.  Flatfoot present. No pain with calf compression, swelling, warmth, erythema  Assessment: Symptomatic onychomycosis  Plan: -All treatment options discussed with the patient including all alternatives, risks, complications.  -Sharply debrided the nails x 10 without any complications or bleeding.  -As a courtesy debride the hyperkeratotic lesions courtesy as it was quite minimal again today normal resolved.  Continue offloading.  He has been using collagen which has been helping. -Follow up with Kerney Pee, pedorthist for diabetic shoe dispensing.  Return in about 3 months (around 06/27/2024).  Charity Conch DPM  - Diabetic shoes and insoles were dispensed by Kerney Pee, pedorthist.

## 2024-03-27 NOTE — Progress Notes (Unsigned)

## 2024-03-31 ENCOUNTER — Other Ambulatory Visit

## 2024-04-03 ENCOUNTER — Ambulatory Visit: Admitting: Family Medicine

## 2024-04-03 ENCOUNTER — Telehealth: Payer: Self-pay | Admitting: Podiatry

## 2024-04-03 ENCOUNTER — Encounter: Payer: Self-pay | Admitting: Family Medicine

## 2024-04-03 VITALS — BP 108/74 | HR 65 | Temp 98.4°F | Ht 63.0 in | Wt 189.6 lb

## 2024-04-03 DIAGNOSIS — E213 Hyperparathyroidism, unspecified: Secondary | ICD-10-CM

## 2024-04-03 DIAGNOSIS — E1122 Type 2 diabetes mellitus with diabetic chronic kidney disease: Secondary | ICD-10-CM | POA: Diagnosis not present

## 2024-04-03 DIAGNOSIS — I10 Essential (primary) hypertension: Secondary | ICD-10-CM

## 2024-04-03 DIAGNOSIS — Z794 Long term (current) use of insulin: Secondary | ICD-10-CM | POA: Diagnosis not present

## 2024-04-03 DIAGNOSIS — H6123 Impacted cerumen, bilateral: Secondary | ICD-10-CM

## 2024-04-03 DIAGNOSIS — M25471 Effusion, right ankle: Secondary | ICD-10-CM

## 2024-04-03 DIAGNOSIS — M25472 Effusion, left ankle: Secondary | ICD-10-CM

## 2024-04-03 DIAGNOSIS — N1832 Chronic kidney disease, stage 3b: Secondary | ICD-10-CM | POA: Diagnosis not present

## 2024-04-03 NOTE — Telephone Encounter (Signed)
 Picked up shoes on 4/22 and they are not a proper fit. Toes hurt and feel like they are jammed in the shoe which is causing the hammer toe to hurt as well. Please call and advise what she needs to do. She will like to reorder another pair. You may leave a voice message if she is not able to answer the phone.

## 2024-04-03 NOTE — Progress Notes (Signed)
 Established Patient Office Visit   Subjective  Patient ID: Bonnie Chavez, female    DOB: February 15, 1934  Age: 89 y.o. MRN: [id number]  Chief Complaint  Patient presents with   Medical Management of Chronic Issues    Pt is an 89 yo female seen for f/u.  Pt states bp improving at home since Norvasc  from 5 mg to 7.5 mg at last OFV.  Patient has BP logs with her, BP 117-143/65-70s.  Patient denies symptoms of hypotension.  Notes mild increased edema in left ankle compared to right.  Thinks has been going on prior to increase in Norvasc .  Blood sugar at home controlled.  Range 107-120s with highest reading this morning in the 130s.  Patient has follow-up with cardiology later this month.  States her provider is retiring.  Still doing exercises from cardiac rehab daily.  Notes some improvement in energy.  Takes breaks if needed.  Denies SOB.  Patient mentions needing to change hearing aid battery.  Also needs ears cleaned prior to audiology appointment tomorrow.    Patient Active Problem List   Diagnosis Date Noted   1st degree AV block 10/06/2023   S/P TAVR (transcatheter aortic valve replacement) 10/05/2023   Severe aortic stenosis    Carpal tunnel syndrome    Neuropathy    (HFpEF) heart failure with preserved ejection fraction (HCC)    CKD stage 3b, GFR 30-44 ml/min (HCC)    Dysfunction of right eustachian tube 09/17/2020   Excessive cerumen in ear canal, bilateral 02/01/2018   Sensorineural hearing loss (SNHL), bilateral 01/19/2017   Hyperparathyroidism (HCC) 02/27/2016   Chronic low back pain 02/27/2016   Morbid obesity (HCC) 02/27/2016   Chronic kidney disease 05/24/2014   Hyperlipidemia 08/21/2013   PMB (postmenopausal bleeding) 05/15/2013   Multiple thyroid  nodules 03/09/2013   Vitamin D  deficiency 03/09/2013   Osteoarthritis 02/06/2010   Vaginal atrophy 01/12/2002    Class: History of   Diabetes mellitus, type II (HCC) 09/07/2001    Class: History of   Past Medical History:   Diagnosis Date   (HFpEF) heart failure with preserved ejection fraction (HCC)    Anemia    History of , resolved   Carpal tunnel syndrome    Chronic low back pain 02/27/2016   -DDD -seeing [place] [facility]    CKD stage 3b, GFR 30-44 ml/min (HCC)    Diabetes mellitus    type 2, sees Dr. Ronelle Coffee   Goiter    Dr. Ronelle Coffee   History of blood transfusion    auto transfusion post knee replacement   Hyperlipidemia    Hyperparathyroidism (HCC) 02/27/2016   -sees endocrinologist, Dr. Ronelle Coffee, has seen surgeon as well    Hypertension    Migraine    no longer has migraines (80's) after menopause   Mitral valve prolapse    Neuropathy    Osteoarthritis    DDD, sees [place] [facility]   PMB (postmenopausal bleeding) 02/28/2009   Resolved   S/P TAVR (transcatheter aortic valve replacement) 10/05/2023   s/p TAVR with a 23 mm Edwards S3UR via the TF approach by Dr. Arlester Ladd and Sherene Dilling   Severe aortic stenosis    Vaginal atrophy    Vitamin D  deficiency    Past Surgical History:  Procedure Laterality Date   CARPAL TUNNEL RELEASE Right 04/30/2023   Procedure: RIGHT CARPAL TUNNEL RELEASE;  Surgeon: Brunilda Capra, MD;  Location: [facility] OR;  Service: Orthopedics;  Laterality: Right;  30 MIN   CESAREAN SECTION  1972 &1976  CHOLECYSTECTOMY  1991   COLONOSCOPY     CORONARY ANGIOGRAPHY N/A 08/16/2023   Procedure: CORONARY ANGIOGRAPHY;  Surgeon: Arnoldo Lapping, MD;  Location: [facility] INVASIVE CV LAB;  Service: Cardiovascular;  Laterality: N/A;   DILATATION & CURRETTAGE/HYSTEROSCOPY WITH RESECTOCOPE N/A 05/15/2013   Procedure: DILATATION & CURETTAGE/HYSTEROSCOPY WITH RESECTOCOPE;  Surgeon: Stevenson Elbe, MD;  Location: [facility] ORS;  Service: Gynecology;  Laterality: N/A;   DILATATION & CURRETTAGE/HYSTEROSCOPY WITH RESECTOCOPE N/A 06/03/2017   Procedure: DILATATION & CURETTAGE/HYSTEROSCOPY;  Surgeon: Stevenson Elbe, MD;  Location: [facility] ORS;  Service: Gynecology;  Laterality: N/A;   DILATION AND CURETTAGE OF UTERUS  1975    HEMORRHOID SURGERY  1997   KNEE SURGERY  2000 2011   right knee replacement    REPLACEMENT TOTAL KNEE  2000   left   SHOULDER SURGERY  2004 2010    TRANSTHORACIC ECHOCARDIOGRAM  07/2010   EF=>55%; LA mildly dilated; trace MR/TR;   TUBAL LIGATION  1976   Social History   Tobacco Use   Smoking status: Never   Smokeless tobacco: Never  Vaping Use   Vaping status: Never Used  Substance Use Topics   Alcohol use: No    Alcohol/week: 0.0 standard drinks of alcohol   Drug use: No   Family History  Problem Relation Age of Onset   Hypertension Mother    Diabetes Mother    Hyperlipidemia Mother    Heart attack Mother    Heart disease Father    Heart attack Father    Stroke Brother    Heart disease Brother    Diabetes Brother    Uterine cancer Maternal Grandmother    Allergies  Allergen Reactions   Lisinopril  Hives    Cough, hives, and lip edema    Nsaids Other (See Comments)    Abnormal renal function   Penicillins Other (See Comments) and Rash    Pt has taken Keflex without difficulty  Has patient had a PCN reaction causing immediate rash, facial/tongue/throat swelling, SOB or lightheadedness with hypotension: yes  Has patient had a PCN reaction causing severe rash involving mucus membranes or skin necrosis: no  Has patient had a PCN reaction that required hospitalization no  Has patient had a PCN reaction occurring within the last 10 years: no  If all of the above answers are "NO", then may proceed with Cephalosporin use.      ROS Negative unless stated above    Objective:      BP 108/74 (BP Location: Left Arm, Patient Position: Sitting, Cuff Size: Normal)   Pulse 65   Temp 98.4 F (36.9 C) (Oral)   Ht 5\' 3"  (1.6 m)   Wt 189 lb 9.6 oz (86 kg)   SpO2 97%   BMI 33.59 kg/m  BP Readings from Last 3 Encounters:  04/03/24 108/74  03/14/24 132/84  02/21/24 (!) 146/70   Wt Readings from Last 3 Encounters:  04/03/24 189 lb 9.6 oz (86 kg)  03/14/24 185 lb  9.6 oz (84.2 kg)  02/21/24 185 lb 9.6 oz (84.2 kg)      Physical Exam Constitutional:      General: She is not in acute distress.    Appearance: Normal appearance.  HENT:     Head: Normocephalic and atraumatic.     Right Ear: There is impacted cerumen.     Left Ear: There is impacted cerumen.     Ears:     Comments: Hearing aids in place bilaterally.  TMs normal after irrigation.  Nose: Nose normal.     Mouth/Throat:     Mouth: Mucous membranes are moist.  Cardiovascular:     Rate and Rhythm: Normal rate and regular rhythm.     Heart sounds: Normal heart sounds. No murmur heard.    No gallop.  Pulmonary:     Effort: Pulmonary effort is normal. No respiratory distress.     Breath sounds: Normal breath sounds. No wheezing, rhonchi or rales.  Skin:    General: Skin is warm and dry.  Neurological:     Mental Status: She is alert and oriented to person, place, and time.        04/03/2024    1:48 PM 02/21/2024    8:36 AM 02/18/2024    1:26 PM  Depression screen PHQ 2/9  Decreased Interest 0 0 0  Down, Depressed, Hopeless 0 0 0  PHQ - 2 Score 0 0 0  Altered sleeping 0 0 0  Tired, decreased energy 0 0 0  Change in appetite 0 0 0  Feeling bad or failure about yourself  0 0 0  Trouble concentrating 0 0 0  Moving slowly or fidgety/restless 0 0 0  Suicidal thoughts 0 0 0  PHQ-9 Score 0 0 0  Difficult doing work/chores   Not difficult at all      04/03/2024    1:48 PM 02/21/2024    8:37 AM 01/20/2024   10:33 AM 09/08/2023    9:48 AM  GAD 7 : Generalized Anxiety Score  Nervous, Anxious, on Edge 0 0 0 0  Control/stop worrying 0 0 0 0  Worry too much - different things 0 0 0 0  Trouble relaxing 0 0 0 0  Restless 0 0 0 0  Easily annoyed or irritable 0 0 0 0  Afraid - awful might happen 0 0 0 0  Total GAD 7 Score 0 0 0 0  Anxiety Difficulty   Not difficult at all Not difficult at all     No results found for any visits on 04/03/24.    Assessment & Plan:  Type 2  diabetes mellitus with stage 3b chronic kidney disease, with long-term current use of insulin  (HCC)  Essential hypertension  Stage 3b chronic kidney disease (HCC)  Hyperparathyroidism (HCC)  Ankle edema, bilateral  Excessive cerumen in ear canal, bilateral  DM controlled.  Goal less than 8%.  Hemoglobin A1c 7.8% on 01/27/2024.  Continue Humulin 70/30 4 units in a.m. and 8 units in p.m.  BP controlled.  Lower reading in clinic than home readings.  Patient given precautions for hypotension.  Also discussed increased LE edema may be contributed to the higher dose of Norvasc .  For continued or worsening LE edema decrease Norvasc  from 7.5 mg to 5 mg daily.  Continue Bystolic  10 mg in a.m. and 5 mg at night.  Continue Sensipar  30 mg Monday Wednesday Friday.  Continue follow-up with nephrology.  Continue Synthroid  50 mcg daily.  Consent obtained.  Bilateral ears irrigated.  Patient tolerated procedure well.  Obtain new hearing aid battery.  Follow-up with audiology later this week.  Return in about 5 months (around 09/03/2024) for chronic conditions.   Viola Greulich, MD

## 2024-04-10 ENCOUNTER — Telehealth: Payer: Self-pay | Admitting: Podiatry

## 2024-04-10 NOTE — Telephone Encounter (Signed)
 Picked up shoes on 4/22 and they are not a proper fit. Toes hurt and feel like they are jammed in the shoe which is causing the hammer toe to hurt as well. Please call and advise what she needs to do. She will like to reorder another pair. You may leave a voice message if she is not able to answer the phone.

## 2024-05-03 ENCOUNTER — Other Ambulatory Visit

## 2024-05-13 ENCOUNTER — Other Ambulatory Visit: Payer: Self-pay | Admitting: Physician Assistant

## 2024-05-15 ENCOUNTER — Ambulatory Visit

## 2024-05-15 NOTE — Progress Notes (Signed)
 Re-ordered same shoe in 2 sz larger 9.5 XWD and 10XWD  Patient has appt to come back 7/7 at 10:15 to try on / shoes and inserts are in cupboard

## 2024-05-29 ENCOUNTER — Ambulatory Visit

## 2024-05-29 NOTE — Progress Notes (Signed)
 Shoe exchanged for 9.5 XWD  Fit was good patient will call if any problems arise  Lolita Schultze CPed, CFo, CFm

## 2024-06-12 ENCOUNTER — Other Ambulatory Visit: Payer: Self-pay

## 2024-06-12 DIAGNOSIS — Z952 Presence of prosthetic heart valve: Secondary | ICD-10-CM

## 2024-06-12 MED ORDER — NEBIVOLOL HCL 10 MG PO TABS: ORAL_TABLET | ORAL | 3 refills | Status: DC

## 2024-06-14 ENCOUNTER — Telehealth: Payer: Self-pay | Admitting: Pharmacy Technician

## 2024-06-14 ENCOUNTER — Other Ambulatory Visit (HOSPITAL_COMMUNITY): Payer: Self-pay

## 2024-06-14 ENCOUNTER — Telehealth: Payer: Self-pay | Admitting: Cardiovascular Disease

## 2024-06-14 ENCOUNTER — Other Ambulatory Visit: Payer: Self-pay | Admitting: Physician Assistant

## 2024-06-14 MED ORDER — NEBIVOLOL HCL 10 MG PO TABS: ORAL_TABLET | ORAL | 2 refills | Status: DC

## 2024-06-14 NOTE — Telephone Encounter (Signed)
 Pharmacy Patient Advocate Encounter  Received notification from [facility] that Prior Authorization for nebivolol  10mg  has been APPROVED from 06/14/24 to 11/22/24

## 2024-06-14 NOTE — Telephone Encounter (Signed)
 nebivolol  (BYSTOLIC ) 10 MG tablet need pa for plan limits since needs 1&1/2 tablets a day.  From pt calls  Pharmacy Patient Advocate Encounter   Received notification from Pt Calls Messages that prior authorization for NEBIVOLOL  10MG  is required/requested.   Insurance verification completed.   The patient is insured through [facility] .   Per test claim: PA required; PA submitted to above mentioned insurance via LATENT Key/confirmation #/EOC [id number] Status is pending

## 2024-06-14 NOTE — Telephone Encounter (Signed)
 Pt c/o medication issue:  1. Name of Medication: Nebivolol   2. How are you currently taking this medication (dosage and times per day)?   3. Are you having a reaction (difficulty breathing--STAT)?   4. What is your medication issue? Patient says she needs the nurse to call this phone number [phone] to get approval for  her to take Nebivolol  1 tablet in the morning  and  1/2 at night please

## 2024-06-14 NOTE — Addendum Note (Signed)
 Addended by: BLUFORD RAMP D on: 06/14/2024 02:12 PM   Modules accepted: Orders

## 2024-06-14 NOTE — Telephone Encounter (Signed)
 PA request has been Submitted. New Encounter has been or will be created for follow up. For additional info see Pharmacy Prior Auth telephone encounter from 06/14/24.

## 2024-06-16 ENCOUNTER — Emergency Department (HOSPITAL_COMMUNITY)

## 2024-06-16 ENCOUNTER — Encounter: Payer: Self-pay | Admitting: Adult Health

## 2024-06-16 ENCOUNTER — Other Ambulatory Visit: Payer: Self-pay

## 2024-06-16 ENCOUNTER — Telehealth (INDEPENDENT_AMBULATORY_CARE_PROVIDER_SITE_OTHER): Admitting: Adult Health

## 2024-06-16 ENCOUNTER — Encounter (HOSPITAL_COMMUNITY): Payer: Self-pay

## 2024-06-16 ENCOUNTER — Inpatient Hospital Stay (HOSPITAL_COMMUNITY)
Admission: EM | Admit: 2024-06-16 | Discharge: 2024-06-22 | DRG: 871 | Disposition: A | Discharge status: Skilled Nursing Facility | Attending: Internal Medicine | Admitting: Internal Medicine

## 2024-06-16 ENCOUNTER — Ambulatory Visit: Payer: Self-pay

## 2024-06-16 DIAGNOSIS — E782 Mixed hyperlipidemia: Secondary | ICD-10-CM

## 2024-06-16 DIAGNOSIS — Z96651 Presence of right artificial knee joint: Secondary | ICD-10-CM | POA: Diagnosis present

## 2024-06-16 DIAGNOSIS — Z79890 Hormone replacement therapy (postmenopausal): Secondary | ICD-10-CM

## 2024-06-16 DIAGNOSIS — Z886 Allergy status to analgesic agent status: Secondary | ICD-10-CM

## 2024-06-16 DIAGNOSIS — E1140 Type 2 diabetes mellitus with diabetic neuropathy, unspecified: Secondary | ICD-10-CM | POA: Diagnosis present

## 2024-06-16 DIAGNOSIS — E872 Acidosis, unspecified: Secondary | ICD-10-CM | POA: Diagnosis present

## 2024-06-16 DIAGNOSIS — E1122 Type 2 diabetes mellitus with diabetic chronic kidney disease: Secondary | ICD-10-CM | POA: Diagnosis present

## 2024-06-16 DIAGNOSIS — K449 Diaphragmatic hernia without obstruction or gangrene: Secondary | ICD-10-CM | POA: Diagnosis present

## 2024-06-16 DIAGNOSIS — Z8249 Family history of ischemic heart disease and other diseases of the circulatory system: Secondary | ICD-10-CM

## 2024-06-16 DIAGNOSIS — Z880 Allergy status to penicillin: Secondary | ICD-10-CM | POA: Diagnosis not present

## 2024-06-16 DIAGNOSIS — N179 Acute kidney failure, unspecified: Secondary | ICD-10-CM | POA: Diagnosis present

## 2024-06-16 DIAGNOSIS — N1832 Chronic kidney disease, stage 3b: Secondary | ICD-10-CM | POA: Diagnosis present

## 2024-06-16 DIAGNOSIS — E785 Hyperlipidemia, unspecified: Secondary | ICD-10-CM | POA: Diagnosis present

## 2024-06-16 DIAGNOSIS — I341 Nonrheumatic mitral (valve) prolapse: Secondary | ICD-10-CM | POA: Diagnosis present

## 2024-06-16 DIAGNOSIS — Z1152 Encounter for screening for COVID-19: Secondary | ICD-10-CM | POA: Diagnosis not present

## 2024-06-16 DIAGNOSIS — Z794 Long term (current) use of insulin: Secondary | ICD-10-CM

## 2024-06-16 DIAGNOSIS — A4151 Sepsis due to Escherichia coli [E. coli]: Secondary | ICD-10-CM | POA: Diagnosis present

## 2024-06-16 DIAGNOSIS — Z952 Presence of prosthetic heart valve: Secondary | ICD-10-CM | POA: Diagnosis not present

## 2024-06-16 DIAGNOSIS — R531 Weakness: Secondary | ICD-10-CM

## 2024-06-16 DIAGNOSIS — E860 Dehydration: Secondary | ICD-10-CM | POA: Diagnosis present

## 2024-06-16 DIAGNOSIS — Z79899 Other long term (current) drug therapy: Secondary | ICD-10-CM

## 2024-06-16 DIAGNOSIS — Z823 Family history of stroke: Secondary | ICD-10-CM

## 2024-06-16 DIAGNOSIS — Z83438 Family history of other disorder of lipoprotein metabolism and other lipidemia: Secondary | ICD-10-CM

## 2024-06-16 DIAGNOSIS — I130 Hypertensive heart and chronic kidney disease with heart failure and stage 1 through stage 4 chronic kidney disease, or unspecified chronic kidney disease: Secondary | ICD-10-CM | POA: Diagnosis present

## 2024-06-16 DIAGNOSIS — I5033 Acute on chronic diastolic (congestive) heart failure: Secondary | ICD-10-CM | POA: Diagnosis present

## 2024-06-16 DIAGNOSIS — M62830 Muscle spasm of back: Secondary | ICD-10-CM | POA: Diagnosis not present

## 2024-06-16 DIAGNOSIS — J9601 Acute respiratory failure with hypoxia: Secondary | ICD-10-CM | POA: Diagnosis present

## 2024-06-16 DIAGNOSIS — Z8049 Family history of malignant neoplasm of other genital organs: Secondary | ICD-10-CM

## 2024-06-16 DIAGNOSIS — I10 Essential (primary) hypertension: Secondary | ICD-10-CM | POA: Diagnosis not present

## 2024-06-16 DIAGNOSIS — E876 Hypokalemia: Secondary | ICD-10-CM | POA: Diagnosis present

## 2024-06-16 DIAGNOSIS — N3000 Acute cystitis without hematuria: Secondary | ICD-10-CM | POA: Diagnosis present

## 2024-06-16 DIAGNOSIS — E039 Hypothyroidism, unspecified: Secondary | ICD-10-CM | POA: Diagnosis present

## 2024-06-16 DIAGNOSIS — Z833 Family history of diabetes mellitus: Secondary | ICD-10-CM

## 2024-06-16 DIAGNOSIS — A419 Sepsis, unspecified organism: Principal | ICD-10-CM | POA: Diagnosis present

## 2024-06-16 DIAGNOSIS — R6520 Severe sepsis without septic shock: Secondary | ICD-10-CM | POA: Diagnosis present

## 2024-06-16 DIAGNOSIS — Z7982 Long term (current) use of aspirin: Secondary | ICD-10-CM

## 2024-06-16 DIAGNOSIS — I5032 Chronic diastolic (congestive) heart failure: Secondary | ICD-10-CM | POA: Diagnosis not present

## 2024-06-16 DIAGNOSIS — E119 Type 2 diabetes mellitus without complications: Secondary | ICD-10-CM

## 2024-06-16 LAB — URINALYSIS, ROUTINE W REFLEX MICROSCOPIC
Bilirubin Urine: NEGATIVE
Glucose, UA: 50 mg/dL — AB
Ketones, ur: NEGATIVE mg/dL
Nitrite: NEGATIVE
Protein, ur: 100 mg/dL — AB
Specific Gravity, Urine: 1.016 (ref 1.005–1.030)
pH: 5 (ref 5.0–8.0)

## 2024-06-16 LAB — CBC WITH DIFFERENTIAL/PLATELET
Abs Immature Granulocytes: 0.29 K/uL — ABNORMAL HIGH (ref 0.00–0.07)
Basophils Absolute: 0 K/uL (ref 0.0–0.1)
Basophils Relative: 0 %
Eosinophils Absolute: 0 K/uL (ref 0.0–0.5)
Eosinophils Relative: 0 %
HCT: 33.7 % — ABNORMAL LOW (ref 36.0–46.0)
Hemoglobin: 11.3 g/dL — ABNORMAL LOW (ref 12.0–15.0)
Immature Granulocytes: 2 %
Lymphocytes Relative: 8 %
Lymphs Abs: 1.2 K/uL (ref 0.7–4.0)
MCH: 32 pg (ref 26.0–34.0)
MCHC: 33.5 g/dL (ref 30.0–36.0)
MCV: 95.5 fL (ref 80.0–100.0)
Monocytes Absolute: 0.7 K/uL (ref 0.1–1.0)
Monocytes Relative: 4 %
Neutro Abs: 14.1 K/uL — ABNORMAL HIGH (ref 1.7–7.7)
Neutrophils Relative %: 86 %
Platelets: 124 K/uL — ABNORMAL LOW (ref 150–400)
RBC: 3.53 MIL/uL — ABNORMAL LOW (ref 3.87–5.11)
RDW: 13.2 % (ref 11.5–15.5)
WBC: 16.3 K/uL — ABNORMAL HIGH (ref 4.0–10.5)
nRBC: 0 % (ref 0.0–0.2)

## 2024-06-16 LAB — CBG MONITORING, ED: Glucose-Capillary: 261 mg/dL — ABNORMAL HIGH (ref 70–99)

## 2024-06-16 LAB — BASIC METABOLIC PANEL WITH GFR
Anion gap: 11 (ref 5–15)
BUN: 36 mg/dL — ABNORMAL HIGH (ref 8–23)
CO2: 22 mmol/L (ref 22–32)
Calcium: 9.8 mg/dL (ref 8.9–10.3)
Chloride: 98 mmol/L (ref 98–111)
Creatinine, Ser: 1.75 mg/dL — ABNORMAL HIGH (ref 0.44–1.00)
GFR, Estimated: 28 mL/min — ABNORMAL LOW (ref >60)
Glucose, Bld: 280 mg/dL — ABNORMAL HIGH (ref 70–99)
Potassium: 3.4 mmol/L — ABNORMAL LOW (ref 3.5–5.1)
Sodium: 131 mmol/L — ABNORMAL LOW (ref 135–145)

## 2024-06-16 LAB — I-STAT CG4 LACTIC ACID, ED
Lactic Acid, Venous: 2.7 mmol/L (ref 0.5–1.9)
Lactic Acid, Venous: 1.5 mmol/L (ref 0.5–1.9)

## 2024-06-16 LAB — RESP PANEL BY RT-PCR (RSV, FLU A&B, COVID)  RVPGX2
Influenza A by PCR: NEGATIVE
Influenza B by PCR: NEGATIVE
Resp Syncytial Virus by PCR: NEGATIVE
SARS Coronavirus 2 by RT PCR: NEGATIVE

## 2024-06-16 MED ORDER — ACETAMINOPHEN 650 MG RE SUPP: 650.0000 mg | Freq: Four times a day (QID) | RECTAL | Status: DC | PRN

## 2024-06-16 MED ORDER — METHOCARBAMOL 500 MG PO TABS: 500.0000 mg | ORAL_TABLET | Freq: Three times a day (TID) | ORAL | 0 refills | Status: DC | PRN

## 2024-06-16 MED ORDER — LACTATED RINGERS IV SOLN: INTRAVENOUS | Status: DC

## 2024-06-16 MED ORDER — SODIUM CHLORIDE 0.9 % IV SOLN
1.0000 g | INTRAVENOUS | Status: DC
  Administered 2024-06-17: 1 g via INTRAVENOUS
  Filled 2024-06-16: qty 10

## 2024-06-16 MED ORDER — SODIUM CHLORIDE 0.9 % IV BOLUS
500.0000 mL | Freq: Once | INTRAVENOUS | Status: AC
  Administered 2024-06-16: 500 mL via INTRAVENOUS

## 2024-06-16 MED ORDER — MELATONIN 3 MG PO TABS
3.0000 mg | ORAL_TABLET | Freq: Every evening | ORAL | Status: DC | PRN
  Administered 2024-06-20: 3 mg via ORAL
  Filled 2024-06-16: qty 1

## 2024-06-16 MED ORDER — SODIUM CHLORIDE 0.9 % IV SOLN
2.0000 g | Freq: Once | INTRAVENOUS | Status: AC
  Administered 2024-06-16: 2 g via INTRAVENOUS
  Filled 2024-06-16: qty 12.5

## 2024-06-16 MED ORDER — VANCOMYCIN HCL IN DEXTROSE 1-5 GM/200ML-% IV SOLN
1000.0000 mg | Freq: Once | INTRAVENOUS | Status: AC
  Administered 2024-06-16: 1000 mg via INTRAVENOUS
  Filled 2024-06-16: qty 200

## 2024-06-16 MED ORDER — ACETAMINOPHEN 500 MG PO TABS
1000.0000 mg | ORAL_TABLET | Freq: Once | ORAL | Status: AC
  Administered 2024-06-16: 1000 mg via ORAL
  Filled 2024-06-16: qty 2

## 2024-06-16 MED ORDER — TRAMADOL HCL 50 MG PO TABS
50.0000 mg | ORAL_TABLET | Freq: Once | ORAL | Status: AC
  Administered 2024-06-16: 50 mg via ORAL
  Filled 2024-06-16: qty 1

## 2024-06-16 MED ORDER — METRONIDAZOLE 500 MG/100ML IV SOLN
500.0000 mg | Freq: Once | INTRAVENOUS | Status: AC
  Administered 2024-06-16: 500 mg via INTRAVENOUS
  Filled 2024-06-16: qty 100

## 2024-06-16 MED ORDER — ACETAMINOPHEN 325 MG PO TABS
650.0000 mg | ORAL_TABLET | Freq: Four times a day (QID) | ORAL | Status: DC | PRN
  Administered 2024-06-17 – 2024-06-22 (×11): 650 mg via ORAL
  Filled 2024-06-16 (×11): qty 2

## 2024-06-16 MED ORDER — ONDANSETRON HCL 4 MG/2ML IJ SOLN: 4.0000 mg | Freq: Four times a day (QID) | INTRAMUSCULAR | Status: DC | PRN

## 2024-06-16 NOTE — Progress Notes (Signed)
 Virtual Visit via Video Note  I connected with Bonnie Chavez  on 06/16/24 at 10:30 AM EDT by a video enabled telemedicine application and verified that I am speaking with the correct person using two identifiers.  Location patient: home Location provider:work or home office Persons participating in the virtual visit: patient, provider  I discussed the limitations of evaluation and management by telemedicine and the availability of in person appointments. The patient expressed understanding and agreed to proceed.   HPI:  89 year old female who is a patient of Dr. Mercer that I am seeing today for an acute issue of back pain.  She reports that her endocrinologist stopped gabapentin  3 days ago due to excessive sweating, prior to stopping gabapentin  she was experiencing low back pain but reports that since stopping gabapentin  she has been having severe muscle spasms mostly in her low back.  She reports that any movement causes her back to spasm which is affecting her gait and causing significant pain.  She denies any injury or trauma.  She has not had any changes in bowel or bladder or lower radiculopathy symptoms.  ROS: See pertinent positives and negatives per HPI.  Past Medical History:  Diagnosis Date   (HFpEF) heart failure with preserved ejection fraction (HCC)    Anemia    History of , resolved   Carpal tunnel syndrome    Chronic low back pain 02/27/2016   -DDD -seeing [facility]    CKD stage 3b, GFR 30-44 ml/min (HCC)    Diabetes mellitus    type 2, sees Dr. Tommas   Goiter    Dr. Tommas   History of blood transfusion    auto transfusion post knee replacement   Hyperlipidemia    Hyperparathyroidism (HCC) 02/27/2016   -sees endocrinologist, Dr. Tommas, has seen surgeon as well    Hypertension    Migraine    no longer has migraines (80's) after menopause   Mitral valve prolapse    Neuropathy    Osteoarthritis    DDD, sees [facility]   PMB (postmenopausal bleeding)  02/28/2009   Resolved   S/P TAVR (transcatheter aortic valve replacement) 10/05/2023   s/p TAVR with a 23 mm Edwards S3UR via the TF approach by Dr. Wonda and Lucas   Severe aortic stenosis    Vaginal atrophy    Vitamin D  deficiency     Past Surgical History:  Procedure Laterality Date   CARPAL TUNNEL RELEASE Right 04/30/2023   Procedure: RIGHT CARPAL TUNNEL RELEASE;  Surgeon: Murrell Drivers, MD;  Location: [facility] OR;  Service: Orthopedics;  Laterality: Right;  30 MIN   CESAREAN SECTION  1972 &1976   CHOLECYSTECTOMY  1991   COLONOSCOPY     CORONARY ANGIOGRAPHY N/A 08/16/2023   Procedure: CORONARY ANGIOGRAPHY;  Surgeon: Wonda Sharper, MD;  Location: [facility] INVASIVE CV LAB;  Service: Cardiovascular;  Laterality: N/A;   DILATATION & CURRETTAGE/HYSTEROSCOPY WITH RESECTOCOPE N/A 05/15/2013   Procedure: DILATATION & CURETTAGE/HYSTEROSCOPY WITH RESECTOCOPE;  Surgeon: Shanda SHAUNNA Muscat, MD;  Location: [facility] ORS;  Service: Gynecology;  Laterality: N/A;   DILATATION & CURRETTAGE/HYSTEROSCOPY WITH RESECTOCOPE N/A 06/03/2017   Procedure: DILATATION & CURETTAGE/HYSTEROSCOPY;  Surgeon: Muscat Shanda SHAUNNA, MD;  Location: [facility] ORS;  Service: Gynecology;  Laterality: N/A;   DILATION AND CURETTAGE OF UTERUS  1975   HEMORRHOID SURGERY  1997   KNEE SURGERY  2000 2011   right knee replacement    REPLACEMENT TOTAL KNEE  2000   left   SHOULDER SURGERY  2004 2010  TRANSTHORACIC ECHOCARDIOGRAM  07/2010   EF=>55%; LA mildly dilated; trace MR/TR;   TUBAL LIGATION  1976    Family History  Problem Relation Age of Onset   Hypertension Mother    Diabetes Mother    Hyperlipidemia Mother    Heart attack Mother    Heart disease Father    Heart attack Father    Stroke Brother    Heart disease Brother    Diabetes Brother    Uterine cancer Maternal Grandmother        Current Outpatient Medications:    acetaminophen  (TYLENOL ) 500 MG tablet, Take 500 mg by mouth every 6 (six) hours as needed (Arthritis)., Disp: , Rfl:     amLODipine  (NORVASC ) 2.5 MG tablet, Take a 2.5 mg tab with a 5 mg tab (for a total daily dose of 7.5 mg), Disp: 90 tablet, Rfl: 3   amLODipine  (NORVASC ) 5 MG tablet, TAKE 1 TABLET (5 MG TOTAL) BY MOUTH DAILY., Disp: 90 tablet, Rfl: 2   aspirin  81 MG EC tablet, Take 81 mg by mouth at bedtime. Swallow whole., Disp: , Rfl:    BD PEN NEEDLE NANO 2ND GEN 32G X 4 MM MISC, USE AS DIRECTED 90, Disp: , Rfl:    cinacalcet  (SENSIPAR ) 30 MG tablet, Take 30 mg by mouth every Monday, Wednesday, and Friday., Disp: , Rfl:    furosemide  (LASIX ) 20 MG tablet, Take 40 mg by mouth every morning. Can take additional tablet if needed, Disp: 30 tablet, Rfl:    HUMULIN 70/30 KWIKPEN (70-30) 100 UNIT/ML KwikPen, Inject 4-8 Units into the skin See admin instructions. 4 units in the AM and 8 units in the pm, Disp: , Rfl:    levothyroxine  (SYNTHROID ) 50 MCG tablet, Take 50 mcg by mouth daily before breakfast., Disp: , Rfl:    methocarbamol (ROBAXIN) 500 MG tablet, Take 1 tablet (500 mg total) by mouth every 8 (eight) hours as needed for muscle spasms., Disp: 15 tablet, Rfl: 0   nebivolol  (BYSTOLIC ) 10 MG tablet, TAKE 1 TABLET BY MOUTH EVERY MORNING AND 1/2 TABLET AT NIGHT, Disp: 135 tablet, Rfl: 2   OneTouch Delica Lancets 33G MISC, Apply topically., Disp: , Rfl:    ONETOUCH VERIO test strip, 1 each 2 (two) times daily., Disp: , Rfl:    rosuvastatin  (CRESTOR ) 40 MG tablet, TAKE 1 TABLET BY MOUTH EVERYDAY AT BEDTIME, Disp: 90 tablet, Rfl: 2   Trolamine Salicylate (BLUE-EMU HEMP EX), Apply 1 application  topically daily as needed (pain)., Disp: , Rfl:    azithromycin  (ZITHROMAX ) 500 MG tablet, Take 1 tablet (500 mg total) 1 hour prior to dental appointments, Disp: 3 tablet, Rfl: 3   gabapentin  (NEURONTIN ) 100 MG capsule, Take 100 mg by mouth at bedtime. (Patient not taking: Reported on 06/16/2024), Disp: , Rfl:   EXAM:  VITALS per patient if applicable:  GENERAL: alert, oriented, appears well and in no acute  distress  HEENT: atraumatic, conjunttiva clear, no obvious abnormalities on inspection of external nose and ears  NECK: normal movements of the head and neck  LUNGS: on inspection no signs of respiratory distress, breathing rate appears normal, no obvious gross SOB, gasping or wheezing  CV: no obvious cyanosis  MS: moves all visible extremities without noticeable abnormality  PSYCH/NEURO: pleasant and cooperative, no obvious depression or anxiety, speech and thought processing grossly intact  ASSESSMENT AND PLAN:  Discussed the following assessment and plan:  1. Spasm of muscle of lower back (Primary) - Will send in short course of  Robaxin to minimize sedative effect of muscle relaxers.  - Follow up if not improving in the next 3-4 days  - methocarbamol (ROBAXIN) 500 MG tablet; Take 1 tablet (500 mg total) by mouth every 8 (eight) hours as needed for muscle spasms.  Dispense: 15 tablet; Refill: 0      I discussed the assessment and treatment plan with the patient. The patient was provided an opportunity to ask questions and all were answered. The patient agreed with the plan and demonstrated an understanding of the instructions.   The patient was advised to call back or seek an in-person evaluation if the symptoms worsen or if the condition fails to improve as anticipated.   Shalene Gallen, NP

## 2024-06-16 NOTE — Telephone Encounter (Signed)
 FYI Only or Action Required?: Action required by provider: request for appointment.  Patient was last seen in primary care on 04/03/2024 by Bonnie Clotilda SAUNDERS, Bonnie Chavez.  Called Nurse Triage reporting Back Pain.  Symptoms began a week ago.  Interventions attempted: OTC medications:  SABRA  Symptoms are: gradually worsening. Having lower left side back pain. Feels like spasms. I can't hardly move without it hurting. Declines in person OV due to pain. Recently stopped gabapentin  per endocrinologist.   Triage Disposition: See HCP Within 4 Hours (Or PCP Triage)  Patient/caregiver understands and will follow disposition?: Yes   Copied from CRM #[id number]. Topic: Clinical - Red Word Triage >> Jun 16, 2024  8:53 AM Bonnie Chavez wrote: Red Word that prompted transfer to Nurse Triage: Back pain hurts to move. Reason for Disposition  [1] SEVERE back pain (e.g., excruciating, unable to do any normal activities) AND [2] not improved 2 hours after pain medicine  Answer Assessment - Initial Assessment Questions 1. ONSET: When did the pain begin? (e.g., minutes, hours, days)     2 weeks 2. LOCATION: Where does it hurt? (upper, mid or lower back)     Lower left 3. SEVERITY: How bad is the pain?  (e.g., Scale 1-10; mild, moderate, or severe)     severe 4. PATTERN: Is the pain constant? (e.g., yes, no; constant, intermittent)      constant 5. RADIATION: Does the pain shoot into your legs or somewhere else?     no 6. CAUSE:  What do you think is causing the back pain?      unsure 7. BACK OVERUSE:  Any recent lifting of heavy objects, strenuous work or exercise?     no 8. MEDICINES: What have you taken so far for the pain? (e.g., nothing, acetaminophen , NSAIDS)     N/a 9. NEUROLOGIC SYMPTOMS: Do you have any weakness, numbness, or problems with bowel/bladder control?     no 10. OTHER SYMPTOMS: Do you have any other symptoms? (e.g., fever, abdomen pain, burning with urination, blood in  urine)       no 11. PREGNANCY: Is there any chance you are pregnant? When was your last menstrual period?       no  Protocols used: Back Pain-A-AH

## 2024-06-16 NOTE — Sepsis Progress Note (Signed)
 Following for sepsis monitoring ?

## 2024-06-16 NOTE — ED Provider Triage Note (Signed)
 Emergency Medicine Provider Triage Evaluation Note  Bonnie Chavez , a 89 y.o. female  was evaluated in triage.  Pt complains of low back pain for 2 weeks, worse over the last 2 days. Did have a fall from bed today in which she slide slowly down to the ground. EMS has to help her up. Denies red flag symptoms. Denies radicular symptoms into legs.   Review of Systems  Positive: LB Negative: Abd pain  Physical Exam  BP (!) 150/60 (BP Location: Left Arm)   Pulse 74   Temp 99.4 F (37.4 C)   Resp 19   Ht 5' 5 (1.651 m)   Wt 84.8 kg   SpO2 97%   BMI 31.12 kg/m  Gen:   Awake, no distress   Resp:  Normal effort  MSK:   Moves extremities without difficulty  Other:    Medical Decision Making  Medically screening exam initiated at 1:20 PM.  Appropriate orders placed.  Bonnie Chavez was informed that the remainder of the evaluation will be completed by another provider, this initial triage assessment does not replace that evaluation, and the importance of remaining in the ED until their evaluation is complete.     Bonnie Warren SAILOR, PA-C 06/16/24 1321

## 2024-06-16 NOTE — ED Triage Notes (Signed)
 Pt to ED c/o lower back pain and back spasms x 1 day. Denies incontinence of bowel and bladder.

## 2024-06-16 NOTE — ED Provider Notes (Signed)
 [place] EMERGENCY DEPARTMENT AT [facility] Provider Note   CSN: [id number] Arrival date & time: 06/16/24  1243     Patient presents with: Back Pain   Bonnie Chavez is a 89 y.o. female.   89 year old female presenting with back pain.  She is companied by her son who provides collateral information.  Patient is drowsy at time of my exam, but easily aroused.  She states that she has had back pain for a little over 1 week, according to her son initially she complained of just body aches however began to complain of low back pain yesterday.  Patient had a telemedicine visit today for this issue and was prescribed Robaxin, she has not picked up this medication yet.  Has been using Tylenol  with minimal relief.  Patient has a fever in the emergency department today, she states that she was not aware she had a fever and has not had any fevers at home.  Denies cough, congestion, shortness of breath, chest pain, dysuria/hematuria, nausea/vomiting/abdominal pain/diarrhea.  Patient's son states that she has been a bit more confused over the past few days, having trouble staying on task, has appeared weaker than normal unable to move herself around in the bed, she typically relates using a walker.   Back Pain      Prior to Admission medications   Medication Sig Start Date End Date Taking? Authorizing Provider  acetaminophen  (TYLENOL ) 500 MG tablet Take 500 mg by mouth every 6 (six) hours as needed (Arthritis).    [provider]  amLODipine  (NORVASC ) 2.5 MG tablet Take a 2.5 mg tab with a 5 mg tab (for a total daily dose of 7.5 mg) 02/21/24   Mercer Clotilda SAUNDERS, MD  amLODipine  (NORVASC ) 5 MG tablet TAKE 1 TABLET (5 MG TOTAL) BY MOUTH DAILY. 05/16/24   Burnard Debby LABOR, MD  aspirin  81 MG EC tablet Take 81 mg by mouth at bedtime. Swallow whole.    [provider]  BD PEN NEEDLE NANO 2ND GEN 32G X 4 MM MISC USE AS DIRECTED 90 07/24/20   [provider]  cinacalcet   (SENSIPAR ) 30 MG tablet Take 30 mg by mouth every Monday, Wednesday, and Friday. 11/01/19   [provider]  furosemide  (LASIX ) 20 MG tablet Take 40 mg by mouth every morning. Can take additional tablet if needed 05/19/18   Luke Chiquita SAUNDERS, DO  gabapentin  (NEURONTIN ) 100 MG capsule Take 100 mg by mouth at bedtime. Patient not taking: Reported on 06/16/2024    [provider]  HUMULIN 70/30 KWIKPEN (70-30) 100 UNIT/ML KwikPen Inject 4-8 Units into the skin See admin instructions. 4 units in the AM and 8 units in the pm 02/24/21   [provider]  levothyroxine  (SYNTHROID ) 50 MCG tablet Take 50 mcg by mouth daily before breakfast.    [provider]  methocarbamol (ROBAXIN) 500 MG tablet Take 1 tablet (500 mg total) by mouth every 8 (eight) hours as needed for muscle spasms. 06/16/24   Nafziger, Darleene, NP  nebivolol  (BYSTOLIC ) 10 MG tablet TAKE 1 TABLET BY MOUTH EVERY MORNING AND 1/2 TABLET AT NIGHT 06/14/24   Loistine Sober, NP  OneTouch Delica Lancets 33G MISC Apply topically. 08/12/20   [provider]  [facility] VERIO test strip 1 each 2 (two) times daily. 10/27/20   [provider]  rosuvastatin  (CRESTOR ) 40 MG tablet TAKE 1 TABLET BY MOUTH EVERYDAY AT BEDTIME 03/07/24   Burnard Debby LABOR, MD  Trolamine Salicylate (BLUE-EMU HEMP EX)  Apply 1 application  topically daily as needed (pain).    [provider]    Allergies: Lisinopril , Nsaids, and Penicillins    Review of Systems  Musculoskeletal:  Positive for back pain.    Updated Vital Signs  Vitals:   06/16/24 1553 06/16/24 1705 06/16/24 1751 06/16/24 1900  BP: (!) 141/62 (!) 143/64 (!) 127/57 109/63  Pulse: 70 76 74 77  Resp: 20 16 16    Temp: (!) 102.1 F (38.9 C)  98.8 F (37.1 C)   TempSrc: Oral  Oral   SpO2: 95% 100% 97% 98%  Weight:      Height:         Physical Exam Vitals reviewed.  Constitutional:      Comments: drowsy  HENT:     Head: Normocephalic.  Eyes:      Extraocular Movements: Extraocular movements intact.  Cardiovascular:     Rate and Rhythm: Normal rate and regular rhythm.     Heart sounds: Murmur (systolic) heard.  Pulmonary:     Effort: Pulmonary effort is normal.     Comments: Faint diffuse expiratory wheeze Abdominal:     Palpations: Abdomen is soft.     Tenderness: There is no abdominal tenderness. There is no right CVA tenderness, left CVA tenderness or guarding.  Musculoskeletal:     Cervical back: Normal range of motion.     Comments: Minimal tenderness to palpation midline L-spine, no T-spine/C-spine tenderness to palpation, no paraspinal muscle tenderness to palpation.   Skin:    General: Skin is warm and dry.  Neurological:     General: No focal deficit present.     Sensory: No sensory deficit.     Motor: Weakness present.     Comments: Facial expressions are intact and symmetric without evidence of facial droop Patient has difficulty pulling herself up/repositioning herself in the bed due to generalized weakness     (all labs ordered are listed, but only abnormal results are displayed) Labs Reviewed  CBC WITH DIFFERENTIAL/PLATELET - Abnormal; Notable for the following components:      Result Value   WBC 16.3 (*)    RBC 3.53 (*)    Hemoglobin 11.3 (*)    HCT 33.7 (*)    Platelets 124 (*)    Neutro Abs 14.1 (*)    Abs Immature Granulocytes 0.29 (*)    All other components within normal limits  BASIC METABOLIC PANEL WITH GFR - Abnormal; Notable for the following components:   Sodium 131 (*)    Potassium 3.4 (*)    Glucose, Bld 280 (*)    BUN 36 (*)    Creatinine, Ser 1.75 (*)    GFR, Estimated 28 (*)    All other components within normal limits  URINALYSIS, ROUTINE W REFLEX MICROSCOPIC - Abnormal; Notable for the following components:   APPearance HAZY (*)    Glucose, UA 50 (*)    Hgb urine dipstick LARGE (*)    Protein, ur 100 (*)    Leukocytes,Ua SMALL (*)    Bacteria, UA MANY (*)    All other  components within normal limits  CBG MONITORING, ED - Abnormal; Notable for the following components:   Glucose-Capillary 261 (*)    All other components within normal limits  I-STAT CG4 LACTIC ACID, ED - Abnormal; Notable for the following components:   Lactic Acid, Venous 2.7 (*)    All other components within normal limits  RESP PANEL BY RT-PCR (RSV, FLU A&B, COVID)  [id number]  CULTURE, BLOOD (ROUTINE X 2)  CULTURE, BLOOD (ROUTINE X 2)  URINE CULTURE  I-STAT CG4 LACTIC ACID, ED    EKG: None  Radiology: CT Renal Stone Study Result Date: 06/16/2024 CLINICAL DATA:  Fever, back pain and hematuria. EXAM: CT ABDOMEN AND PELVIS WITHOUT CONTRAST TECHNIQUE: Multidetector CT imaging of the abdomen and pelvis was performed following the standard protocol without IV contrast. RADIATION DOSE REDUCTION: This exam was performed according to the departmental dose-optimization program which includes automated exposure control, adjustment of the mA and/or kV according to patient size and/or use of iterative reconstruction technique. COMPARISON:  CTA abdomen and pelvis 08/30/2023. MRI abdomen 10/25/2023. FINDINGS: Lower chest: No acute abnormality. Hepatobiliary: No focal liver abnormality is seen. Status post cholecystectomy. No biliary dilatation. Pancreas: There are multiple cystic areas in the pancreas measuring up to 13 mm. These were more well defined on the prior study, but appear grossly unchanged. No acute inflammation identified. Spleen: Normal in size without focal abnormality. Adrenals/Urinary Tract: Adrenal glands are unremarkable. Kidneys are normal, without renal calculi, focal lesion, or hydronephrosis. Bladder is unremarkable. Stomach/Bowel: Stomach is within normal limits. There is a small hiatal hernia. Appendix appears normal. No evidence of bowel wall thickening, distention, or inflammatory changes. Vascular/Lymphatic: Aortic atherosclerosis. No enlarged abdominal or pelvic lymph nodes.  Reproductive: Calcified uterine fibroids are present measuring up to 14 mm. Adnexa are within normal limits. Other: There is no ascites or free air. There is a small fat containing umbilical hernia. Fall circumscribed low-density areas in the mesentery are again seen in the central abdomen and left lower quadrant measuring up to 2.6 x 1.6 cm. These appear cystic and unchanged. Musculoskeletal: Severe degenerative changes affect the spine. IMPRESSION: 1. No acute localizing process in the abdomen or pelvis. 2. Stable cystic areas in the pancreas and mesentery. 3. Stable uterine fibroids. 4. Aortic atherosclerosis. Aortic Atherosclerosis (ICD10-I70.0). Electronically Signed   By: Greig Pique M.D.   On: 06/16/2024 21:32   DG Chest 1 View Result Date: 06/16/2024 CLINICAL DATA:  Fever.  Wheezing. EXAM: CHEST  1 VIEW COMPARISON:  Chest radiograph dated 10/01/2023 FINDINGS: A shallow inspiration. No focal consolidation, pleural effusion or pneumothorax. Mild cardiomegaly. Aortic valve repair. No acute osseous pathology. Bilateral shoulder arthroplasties. IMPRESSION: 1. No active disease. 2. Mild cardiomegaly. Electronically Signed   By: Vanetta Chou M.D.   On: 06/16/2024 17:45   DG Lumbar Spine Complete Result Date: 06/16/2024 CLINICAL DATA:  low back pain 2 weeks, worse over the last 1 day. EXAM: LUMBAR SPINE - COMPLETE 4+ VIEW COMPARISON:  None Available. FINDINGS: There are 6 nonrib-bearing lumbar vertebrae. There is exaggerated lumbar lordosis. There is grade 1 anterolisthesis of L5 over S1 and L3 over L4. There is also grade 1 retrolisthesis of L2 over L3. no spondylolysis. Vertebral body heights are maintained. No aggressive osseous lesion. Moderate multilevel degenerative changes in the form of reduced intervertebral disc height, endplate sclerosis/irregularity, facet arthropathy and marginal osteophyte formation. Sacroiliac joints are symmetric. Visualized soft tissues are within normal limits.  IMPRESSION: No acute osseous abnormality of the lumbar spine. Moderate multilevel degenerative changes. Electronically Signed   By: Ree Molt M.D.   On: 06/16/2024 14:09     Procedures   Medications Ordered in the ED  lactated ringers  infusion ( Intravenous New Bag/Given 06/16/24 1927)  traMADol  (ULTRAM ) tablet 50 mg (50 mg Oral Given 06/16/24 1326)  acetaminophen  (TYLENOL ) tablet 1,000 mg (1,000 mg Oral Given 06/16/24 1649)  sodium chloride  0.9 % bolus 500 mL (  0 mLs Intravenous Stopped 06/16/24 2004)  ceFEPIme (MAXIPIME) 2 g in sodium chloride  0.9 % 100 mL IVPB (0 g Intravenous Stopped 06/16/24 1958)  metroNIDAZOLE (FLAGYL) IVPB 500 mg (0 mg Intravenous Stopped 06/16/24 2203)  vancomycin (VANCOCIN) IVPB 1000 mg/200 mL premix (1,000 mg Intravenous New Bag/Given 06/16/24 2125)                                    Medical Decision Making This patient presents to the ED for concern of back pain, this involves an extensive number of treatment options, and is a complaint that carries with it a high risk of complications and morbidity.  The differential diagnosis includes fracture, musculoskeletal sprain/strain/pain, urinary tract infection, pyelonephritis, sepsis.   Co morbidities that complicate the patient evaluation  Diabetes, OA, dyslipidemia   Additional history obtained:  Additional history obtained from record review External records from outside source obtained and reviewed including telemedicine note from earlier today   Lab Tests:  I Ordered, and personally interpreted labs.  The pertinent results include: CBC notable for leukocytosis with left shift with white blood cell count of 16.3, anemia with hemoglobin of 11.3, reduced platelet count at 124 -this is improved from patient's most recent baseline.  Hyperglycemia noted on CBG, 261.  Lactic acid 2.7, repeat 1.5.  BMP is notable for mild hyponatremia with sodium of 131, mild hypokalemia with potassium of 3.4, elevated creatinine  at 1.75 however patient has baseline creatinine ranging from 1.39-1.64 over the past 8 months, this is unlikely representative of a true AKI.  Urinalysis notable for glucosuria, large RBCs, protein, small leukocytes with many bacteria, will send for culture.  COVID/flu/RSV negative.   Imaging Studies ordered:  I ordered imaging studies including lumbar XR, CXR, CT renal study I independently visualized and interpreted imaging which showed  - Lumbar XR: No acute osseous abnormality of the lumbar spine. Moderate multilevel degenerative changes. - CXR: 1. No active disease. 2. Mild cardiomegaly. - CT: 1. No acute localizing process in the abdomen or pelvis. 2. Stable cystic areas in the pancreas and mesentery. 3. Stable uterine fibroids. 4. Aortic atherosclerosis.   I agree with the radiologist interpretation   Cardiac Monitoring: / EKG:  The patient was maintained on a cardiac monitor.  I personally viewed and interpreted the cardiac monitored which showed an underlying rhythm of: NSR   Consultations Obtained:  I requested consultation with the hospitalist,  and discussed lab and imaging findings as well as pertinent plan - they recommend: spoke with Dr. Marcene who agrees that this patient is appropriate for admission for sepsis likely in the setting of UTI.   Problem List / ED Course / Critical interventions / Medication management  Tramadol  ordered in triage for back pain I ordered medication including Tylenol  for fever, cefepime/Flagyl/vancomycin for undifferentiated sepsis, IV fluids Reevaluation of the patient after these medicines showed that the patient improved I have reviewed the patients home medicines and have made adjustments as needed   Social Determinants of Health:  Physical inactivity   Test / Admission - Considered:  Physical exam notable as above, patient found to be febrile at time of my initial assessment, given fever/weakness/recent confusion, will  proceed with infectious workup.  X-ray imaging largely reassuring as above, patient has very minimal tenderness on palpation of her L-spine, however I have a very low suspicion for epidural abscess as root cause of her sepsis.  Patient's son  mentions that she has been much more weak today, has difficulty standing on her own and even pulling herself upright in the bed, this is very different from her baseline.  Sepsis workup was notable as above, patient was started on broad-spectrum antibiotics for undifferentiated sepsis presentation.  Urinalysis was notable for leukocytes/bacteria as well as RBCs, this may be attributing to patient's septic presentation this evening, will send for culture.  I obtained a CT renal stone study to rule out septic stone given RBCs noted on urinalysis, see above for imaging interpretation.  I discussed this patient's case with the hospitalist service, see above for their recommendations.  She is appropriate for admission at this time.    Amount and/or Complexity of Data Reviewed Labs: ordered. Radiology: ordered.  Risk OTC drugs. Prescription drug management.        Final diagnoses:  Sepsis, due to unspecified organism, unspecified whether acute organ dysfunction present [facility])    ED Discharge Orders     None          Glendia Rocky LOISE DEVONNA 06/16/24 2316    Lenor Hollering, MD 06/16/24 2333

## 2024-06-16 NOTE — H&P (Signed)
 History and Physical      Bonnie Chavez FMW:[id number] DOB: 15-May-1934 DOA: 06/16/2024; DOS: 06/16/2024  PCP: Mercer Clotilda SAUNDERS, MD  Patient coming from: home   I have personally briefly reviewed patient's old medical records in [facility]  Chief Complaint: Generalized weakness  HPI: Bonnie Chavez is a 89 y.o. female with medical history significant for chronic diastolic heart failure, CKD 3B with baseline creatinine 1.4-1.7, type 2 diabetes mellitus, essential pretension, hyperlipidemia, acquired hypothyroidism, severe aortic stenosis status post TAVR in November 2024, who is admitted to [facility] on 06/16/2024 with generalized weaknessafter presenting from home to [facility] ED complaining of generalized weakness.   The following history is provided by the patient as well as the patient's son, in addition to my discussions with the EDP and via chart review.  The patient reports to 3 days of generalized weakness the absence of any acute focal weakness.  Is been associated with new low back discomfort as well as some dysuria over the last 1 to 2 days.  She also reports subjective fever over the last 2 days in the absence of any chills, full body rigors, or generalized myalgias.  No recent shortness of breath, cough,  In the setting of her generalized weakness, patient conveys that she has been less able to contribute to her ADLs relative to her baseline contributory abilities.    ED Course:  Vital signs in the ED were notable for the following: Temperature max 102.1; heart rates in the 70s to 80s; systolic blood pressures in the 120s 140s; respiratory rate 1624, oxygen saturation 95 to 100% on room air.  Labs were notable for the following: BMP notable for sodium 131 which corrects to approximately 134 when taking into account concomitant hyperglycemia, potassium 3.2, bicarbonate 22, anion gap 11, creatinine 1.35, BUN/creatinine Xuriden 20, glucose 280.  CBC notable for open cell count  16,000 g with 86% neutrophils.  Initial lactate 2.7 with repeat value trending down 1.5.  Urinalysis associated hazy appearing specimen and notable for 11-20 white blood cells, many bacteria, no evidence of squamous epithelial cells.  Blood cultures x 2 and urine culture were collected prior to initiation 5 antibiotics.  COVID, influenza, RSV PCR all negative.  Per my interpretation, EKG in ED demonstrated the following: No EKG performed in the ED today.  Imaging in the ED, per corresponding formal radiology read, was notable for the following: Plain film of the lumbar spine showed evidence of acute osseous abnormality.  1 view chest x-ray showed no evidence of acute cardiopulmonary process, including no evidence of infiltrate edema, effusion, pneumothorax.  CT renal stone showed no evidence of acute process, including no evidence of ureteral stone or pyelonephritis.  While in the ED, the following were administered: Acetaminophen  1 g p.o. x 1, potassium chloride  20 mg p.o. x 1 dose, tramadol  50 mg p.o. x 1 dose, cefepime  IV Flagyl , IV vancomycin  and Normal Saline NS 500 cc bolus followed by continuous LR running at 150 cc/h.  Subsequently, the patient was admitted for further evaluation management of present generalized weakness in the setting of severe sepsis due to acute cystitis, with presenting labs also notable for hypokalemia.     Review of Systems: As per HPI otherwise 10 point review of systems negative.   Past Medical History:  Diagnosis Date   (HFpEF) heart failure with preserved ejection fraction (HCC)    Anemia    History of , resolved   Carpal tunnel syndrome    Chronic  low back pain 02/27/2016   -DDD -seeing [facility]    CKD stage 3b, GFR 30-44 ml/min (HCC)    Diabetes mellitus    type 2, sees Dr. Tommas   Goiter    Dr. Tommas   History of blood transfusion    auto transfusion post knee replacement   Hyperlipidemia    Hyperparathyroidism (HCC) 02/27/2016    -sees endocrinologist, Dr. Tommas, has seen surgeon as well    Hypertension    Migraine    no longer has migraines (80's) after menopause   Mitral valve prolapse    Neuropathy    Osteoarthritis    DDD, sees [place] [facility]   PMB (postmenopausal bleeding) 02/28/2009   Resolved   S/P TAVR (transcatheter aortic valve replacement) 10/05/2023   s/p TAVR with a 23 mm Edwards S3UR via the TF approach by Dr. Wonda and Lucas   Severe aortic stenosis    Vaginal atrophy    Vitamin D  deficiency     Past Surgical History:  Procedure Laterality Date   CARPAL TUNNEL RELEASE Right 04/30/2023   Procedure: RIGHT CARPAL TUNNEL RELEASE;  Surgeon: Murrell Drivers, MD;  Location: [facility];  Service: Orthopedics;  Laterality: Right;  30 MIN   CESAREAN SECTION  1972 &1976   CHOLECYSTECTOMY  1991   COLONOSCOPY     CORONARY ANGIOGRAPHY N/A 08/16/2023   Procedure: CORONARY ANGIOGRAPHY;  Surgeon: Wonda Sharper, MD;  Location: [facility] INVASIVE CV LAB;  Service: Cardiovascular;  Laterality: N/A;   DILATATION & CURRETTAGE/HYSTEROSCOPY WITH RESECTOCOPE N/A 05/15/2013   Procedure: DILATATION & CURETTAGE/HYSTEROSCOPY WITH RESECTOCOPE;  Surgeon: Shanda SHAUNNA Muscat, MD;  Location: [facility] ORS;  Service: Gynecology;  Laterality: N/A;   DILATATION & CURRETTAGE/HYSTEROSCOPY WITH RESECTOCOPE N/A 06/03/2017   Procedure: DILATATION & CURETTAGE/HYSTEROSCOPY;  Surgeon: Muscat Shanda SHAUNNA, MD;  Location: [facility] ORS;  Service: Gynecology;  Laterality: N/A;   DILATION AND CURETTAGE OF UTERUS  1975   HEMORRHOID SURGERY  1997   KNEE SURGERY  2000 2011   right knee replacement    REPLACEMENT TOTAL KNEE  2000   left   SHOULDER SURGERY  2004 2010    TRANSTHORACIC ECHOCARDIOGRAM  07/2010   EF=>55%; LA mildly dilated; trace MR/TR;   TUBAL LIGATION  1976    Social History:  reports that she has never smoked. She has never used smokeless tobacco. She reports that she does not drink alcohol and does not use drugs.   Allergies  Allergen Reactions    Lisinopril  Hives    Cough, hives, and lip edema    Nsaids Other (See Comments)    Abnormal renal function   Penicillins Other (See Comments) and Rash    Pt has taken Keflex without difficulty  Has patient had a PCN reaction causing immediate rash, facial/tongue/throat swelling, SOB or lightheadedness with hypotension: yes  Has patient had a PCN reaction causing severe rash involving mucus membranes or skin necrosis: no  Has patient had a PCN reaction that required hospitalization no  Has patient had a PCN reaction occurring within the last 10 years: no  If all of the above answers are NO, then may proceed with Cephalosporin use.    Family History  Problem Relation Age of Onset   Hypertension Mother    Diabetes Mother    Hyperlipidemia Mother    Heart attack Mother    Heart disease Father    Heart attack Father    Stroke Brother    Heart disease Brother    Diabetes Brother  Uterine cancer Maternal Grandmother     Prior to Admission medications   Medication Sig Start Date End Date Taking? Authorizing Provider  acetaminophen  (TYLENOL ) 500 MG tablet Take 500 mg by mouth every 6 (six) hours as needed (Arthritis).    [provider]  amLODipine  (NORVASC ) 2.5 MG tablet Take a 2.5 mg tab with a 5 mg tab (for a total daily dose of 7.5 mg) 02/21/24   Mercer Clotilda SAUNDERS, MD  amLODipine  (NORVASC ) 5 MG tablet TAKE 1 TABLET (5 MG TOTAL) BY MOUTH DAILY. 05/16/24   Burnard Debby LABOR, MD  aspirin  81 MG EC tablet Take 81 mg by mouth at bedtime. Swallow whole.    [provider]  BD PEN NEEDLE NANO 2ND GEN 32G X 4 MM MISC USE AS DIRECTED 90 07/24/20   [provider]  cinacalcet  (SENSIPAR ) 30 MG tablet Take 30 mg by mouth every Monday, Wednesday, and Friday. 11/01/19   [provider]  furosemide  (LASIX ) 20 MG tablet Take 40 mg by mouth every morning. Can take additional tablet if needed 05/19/18   Luke Chiquita SAUNDERS, DO  gabapentin  (NEURONTIN ) 100 MG capsule Take 100  mg by mouth at bedtime. Patient not taking: Reported on 06/16/2024    [provider]  HUMULIN 70/30 KWIKPEN (70-30) 100 UNIT/ML KwikPen Inject 4-8 Units into the skin See admin instructions. 4 units in the AM and 8 units in the pm 02/24/21   [provider]  levothyroxine  (SYNTHROID ) 50 MCG tablet Take 50 mcg by mouth daily before breakfast.    [provider]  methocarbamol  (ROBAXIN ) 500 MG tablet Take 1 tablet (500 mg total) by mouth every 8 (eight) hours as needed for muscle spasms. 06/16/24   Nafziger, Darleene, NP  nebivolol  (BYSTOLIC ) 10 MG tablet TAKE 1 TABLET BY MOUTH EVERY MORNING AND 1/2 TABLET AT NIGHT 06/14/24   Loistine Sober, NP  OneTouch Delica Lancets 33G MISC Apply topically. 08/12/20   [provider]  [facility] VERIO test strip 1 each 2 (two) times daily. 10/27/20   [provider]  rosuvastatin  (CRESTOR ) 40 MG tablet TAKE 1 TABLET BY MOUTH EVERYDAY AT BEDTIME 03/07/24   Burnard Debby LABOR, MD  Trolamine Salicylate (BLUE-EMU HEMP EX) Apply 1 application  topically daily as needed (pain).    [provider]     Objective    Physical Exam: Vitals:   06/16/24 2100 06/16/24 2130 06/16/24 2200 06/16/24 2216  BP: (!) 128/57 (!) 135/55 (!) 133/53   Pulse: 79 66 75   Resp: (!) 27 (!) 24 (!) 24   Temp:    99 F (37.2 C)  TempSrc:    Oral  SpO2: 98% 96% 96%   Weight:      Height:        General: appears to be stated age; alert,  Skin: warm, dry, no rash Head:  AT/[place] Mouth:  Oral mucosa membranes appear dry, normal dentition Neck: supple; trachea midline Heart:  RRR; did not appreciate any M/R/G Lungs: CTAB, did not appreciate any wheezes, rales, or rhonchi Abdomen: + BS; soft, ND, NT Vascular: 2+ pedal pulses b/l; 2+ radial pulses b/l Extremities: no peripheral edema, no muscle wasting     Labs on Admission: I have personally reviewed following labs and imaging studies  CBC: Recent Labs  Lab 06/16/24 1645  WBC  16.3*  NEUTROABS 14.1*  HGB 11.3*  HCT 33.7*  MCV 95.5  PLT 124*   Basic Metabolic Panel: Recent Labs  Lab 06/16/24  1645  NA 131*  K 3.4*  CL 98  CO2 22  GLUCOSE 280*  BUN 36*  CREATININE 1.75*  CALCIUM  9.8   GFR: Estimated Creatinine Clearance: 23.9 mL/min (A) (by C-G formula based on SCr of 1.75 mg/dL (H)). Liver Function Tests: No results for input(s): AST, ALT, ALKPHOS, BILITOT, PROT, ALBUMIN in the last 168 hours. No results for input(s): LIPASE, AMYLASE in the last 168 hours. No results for input(s): AMMONIA in the last 168 hours. Coagulation Profile: No results for input(s): INR, PROTIME in the last 168 hours. Cardiac Enzymes: No results for input(s): CKTOTAL, CKMB, CKMBINDEX, TROPONINI in the last 168 hours. BNP (last 3 results) No results for input(s): PROBNP in the last 8760 hours. HbA1C: No results for input(s): HGBA1C in the last 72 hours. CBG: Recent Labs  Lab 06/16/24 1728  GLUCAP 261*   Lipid Profile: No results for input(s): CHOL, HDL, LDLCALC, TRIG, CHOLHDL, LDLDIRECT in the last 72 hours. Thyroid  Function Tests: No results for input(s): TSH, T4TOTAL, FREET4, T3FREE, THYROIDAB in the last 72 hours. Anemia Panel: No results for input(s): VITAMINB12, FOLATE, FERRITIN, TIBC, IRON, RETICCTPCT in the last 72 hours. Urine analysis:    Component Value Date/Time   COLORURINE YELLOW 06/16/2024 1636   APPEARANCEUR HAZY (A) 06/16/2024 1636   LABSPEC 1.016 06/16/2024 1636   PHURINE 5.0 06/16/2024 1636   GLUCOSEU 50 (A) 06/16/2024 1636   HGBUR LARGE (A) 06/16/2024 1636   BILIRUBINUR NEGATIVE 06/16/2024 1636   BILIRUBINUR n 09/15/2012 1240   KETONESUR NEGATIVE 06/16/2024 1636   PROTEINUR 100 (A) 06/16/2024 1636   UROBILINOGEN 0.2 09/15/2012 1240   UROBILINOGEN 0.2 10/21/2010 1025   NITRITE NEGATIVE 06/16/2024 1636   LEUKOCYTESUR SMALL (A) 06/16/2024 1636    Radiological Exams on  Admission: CT Renal Stone Study Result Date: 06/16/2024 CLINICAL DATA:  Fever, back pain and hematuria. EXAM: CT ABDOMEN AND PELVIS WITHOUT CONTRAST TECHNIQUE: Multidetector CT imaging of the abdomen and pelvis was performed following the standard protocol without IV contrast. RADIATION DOSE REDUCTION: This exam was performed according to the departmental dose-optimization program which includes automated exposure control, adjustment of the mA and/or kV according to patient size and/or use of iterative reconstruction technique. COMPARISON:  CTA abdomen and pelvis 08/30/2023. MRI abdomen 10/25/2023. FINDINGS: Lower chest: No acute abnormality. Hepatobiliary: No focal liver abnormality is seen. Status post cholecystectomy. No biliary dilatation. Pancreas: There are multiple cystic areas in the pancreas measuring up to 13 mm. These were more well defined on the prior study, but appear grossly unchanged. No acute inflammation identified. Spleen: Normal in size without focal abnormality. Adrenals/Urinary Tract: Adrenal glands are unremarkable. Kidneys are normal, without renal calculi, focal lesion, or hydronephrosis. Bladder is unremarkable. Stomach/Bowel: Stomach is within normal limits. There is a small hiatal hernia. Appendix appears normal. No evidence of bowel wall thickening, distention, or inflammatory changes. Vascular/Lymphatic: Aortic atherosclerosis. No enlarged abdominal or pelvic lymph nodes. Reproductive: Calcified uterine fibroids are present measuring up to 14 mm. Adnexa are within normal limits. Other: There is no ascites or free air. There is a small fat containing umbilical hernia. Fall circumscribed low-density areas in the mesentery are again seen in the central abdomen and left lower quadrant measuring up to 2.6 x 1.6 cm. These appear cystic and unchanged. Musculoskeletal: Severe degenerative changes affect the spine. IMPRESSION: 1. No acute localizing process in the abdomen or pelvis. 2. Stable  cystic areas in the pancreas and mesentery. 3. Stable uterine fibroids. 4. Aortic atherosclerosis. Aortic Atherosclerosis (ICD10-I70.0). Electronically  Signed   By: Greig Pique M.D.   On: 06/16/2024 21:32   DG Chest 1 View Result Date: 06/16/2024 CLINICAL DATA:  Fever.  Wheezing. EXAM: CHEST  1 VIEW COMPARISON:  Chest radiograph dated 10/01/2023 FINDINGS: A shallow inspiration. No focal consolidation, pleural effusion or pneumothorax. Mild cardiomegaly. Aortic valve repair. No acute osseous pathology. Bilateral shoulder arthroplasties. IMPRESSION: 1. No active disease. 2. Mild cardiomegaly. Electronically Signed   By: Vanetta Chou M.D.   On: 06/16/2024 17:45   DG Lumbar Spine Complete Result Date: 06/16/2024 CLINICAL DATA:  low back pain 2 weeks, worse over the last 1 day. EXAM: LUMBAR SPINE - COMPLETE 4+ VIEW COMPARISON:  None Available. FINDINGS: There are 6 nonrib-bearing lumbar vertebrae. There is exaggerated lumbar lordosis. There is grade 1 anterolisthesis of L5 over S1 and L3 over L4. There is also grade 1 retrolisthesis of L2 over L3. no spondylolysis. Vertebral body heights are maintained. No aggressive osseous lesion. Moderate multilevel degenerative changes in the form of reduced intervertebral disc height, endplate sclerosis/irregularity, facet arthropathy and marginal osteophyte formation. Sacroiliac joints are symmetric. Visualized soft tissues are within normal limits. IMPRESSION: No acute osseous abnormality of the lumbar spine. Moderate multilevel degenerative changes. Electronically Signed   By: Ree Molt M.D.   On: 06/16/2024 14:09      Assessment/Plan   Principal Problem:   Generalized weakness Active Problems:   DM2 (diabetes mellitus, type 2) (HCC)   Essential hypertension   HLD (hyperlipidemia)   Chronic diastolic CHF (congestive heart failure) (HCC)   CKD stage 3b, GFR 30-44 ml/min (HCC)   Severe sepsis (HCC)   Acute cystitis   Acquired  hypothyroidism      # )  generalized weakness: To 3 days of generalized weakness in the absence of any acute focal weakness, with impacts on patient's ability to perform ADLs at baseline to her associated baseline gait abilities.  Appears to be on basis of physical distress starting from presenting severe sepsis due to urinary tract infection, as below.  No evidence of additional underlying infectious process at this time, including no evidence of infiltrate on chest x-ray.  Plan: Further evaluation management services due to UTI, as below.  Full precautions, PT/OT consult for the morning.  Check B12, TSH level.                    #) Severe sepsis due to acute cystitis: Diagnosis on the basis of new onset 2 days of low back discomfort associated with dysuria, objective fever, generalized weakness, with presenting urinalysis showing significant pyuria, many bacteria, and no evidence of squamous epithelial cells to suggest a contaminated specimen.  SIRS criteria met via objective fever in the emergency department, along with leukocytosis with neutrophilic predominance. Lactic acid level: Initially elevated at 2.7, with repeat trending down to 1.5. Of note, given the associated presence of suspected end organ damage in the form of concominant presenting elevated initial lactate, criteria are met for pt's sepsis to be considered severe in nature. However, in the absence of lactic acid level that is greater than or equal to 4.0, and in the absence of any associated hypotension refractory to IVF's, there are no indications for administration of a 30 mL/kg IVF bolus at this time.   Additional ED work-up/management notable for: Blood cultures x 2 were collected and urine culture were collected prior to initiation of broad-spectrum IV antibiotics in the form of cefepime , IV vancomycin , and Flagyl .  Will de-escalate to Rocephin  given  suspected urinary source.  Plan: CBC w/ diff and CMP in AM.   Follow for results of blood cx's x 2.  Follow-up result of urine culture.  Start Rocephin , as above.  As needed acetaminophen  for fever.  Will reduce rate of IV fluids from 150 cc/h to 75 cc/h given appearance of.  Neck stability as well as in the context of documented history of chronic diastolic heart failure. Add-on procalcitonin level.                          #) Hypokalemia: presenting potassium level noted to be 3.4.  Patient has received 20 mill equivalents of oral potassium chloride  this evening.  Plan: monitor on tele.Add-on serum mag level. CMP, mag level in the AM.                        #) Chronic diastolic heart failure: documented history of such, with most recent echocardiogram performed in December 2024, which was notable for LVEF 55%, grade 1 diastolic dysfunction and normal right ventricular systolic function. No clinical or radiographic evidence to suggest acutely decompensated heart failure at this time. home diuretic regimen reportedly consists of the following: Lasix  40 mg p.o. daily.   Plan: monitor strict I's & O's and daily weights. Repeat CMP in AM. Check serum mag level.  In the setting of presenting severe sepsis and clinical evidence of mild dehydration, will hold home Lasix  for now.  Reduce rate of lactated Ringer 's from 150 cc/h to 75 cc/h.  Add on BNP level.                       #) CKD Stage 3B: Documented history of such, with baseline creatinine 1.4-1.7, with presenting creatinine consistent with this baseline.    Plan: Monitor strict I's and O's and daily weights.  Attempt to avoid nephrotoxic agents.  CMP/magnesium  level in the AM.                      #) Type 2 Diabetes Mellitus: documented history of such. Home insulin  regimen: 70/30 insulin . Home oral hypoglycemic agents: None. presenting blood sugar: 280.  Plan: accuchecks QAC and HS with low dose SSI.                          #) Hyperlipidemia: documented h/o such. On rosuvastatin  as outpatient.   Plan: continue home statin.                      #) Essential Hypertension: documented h/o such, with outpatient antihypertensive regimen including no labetalol , Norvasc .  SBP's in the ED today: 120s 140s mmHg.   Plan: Close monitoring of subsequent BP via routine VS. in the setting of presenting severe sepsis, will hold home beta-blocker and amlodipine  for now.  Monitor strict I's and O's and daily weights.                      #) acquired hypothyroidism: documented h/o such, on Synthroid  as outpatient.   Plan: cont home Synthroid .  Check TSH.       DVT prophylaxis: SCD's   Code Status: Full code Family Communication: none Disposition Plan: Per Rounding Team Consults called: none;  Admission status: Inpatient     I SPENT GREATER THAN 75  MINUTES IN CLINICAL CARE TIME/MEDICAL DECISION-MAKING IN  COMPLETING THIS ADMISSION.      Eva NOVAK Macallan Ord DO [facility]  From 7PM - 7AM   06/16/2024, 11:52 PM

## 2024-06-17 ENCOUNTER — Encounter (HOSPITAL_COMMUNITY): Payer: Self-pay | Admitting: Internal Medicine

## 2024-06-17 DIAGNOSIS — N3000 Acute cystitis without hematuria: Secondary | ICD-10-CM | POA: Diagnosis present

## 2024-06-17 DIAGNOSIS — R531 Weakness: Secondary | ICD-10-CM | POA: Diagnosis not present

## 2024-06-17 DIAGNOSIS — E039 Hypothyroidism, unspecified: Secondary | ICD-10-CM | POA: Diagnosis present

## 2024-06-17 DIAGNOSIS — A419 Sepsis, unspecified organism: Secondary | ICD-10-CM | POA: Diagnosis present

## 2024-06-17 LAB — CBC WITH DIFFERENTIAL/PLATELET
Abs Immature Granulocytes: 0.81 K/uL — ABNORMAL HIGH (ref 0.00–0.07)
Basophils Absolute: 0 K/uL (ref 0.0–0.1)
Basophils Relative: 0 %
Eosinophils Absolute: 0 K/uL (ref 0.0–0.5)
Eosinophils Relative: 0 %
HCT: 35.8 % — ABNORMAL LOW (ref 36.0–46.0)
Hemoglobin: 12 g/dL (ref 12.0–15.0)
Immature Granulocytes: 4 %
Lymphocytes Relative: 5 %
Lymphs Abs: 0.9 K/uL (ref 0.7–4.0)
MCH: 32.3 pg (ref 26.0–34.0)
MCHC: 33.5 g/dL (ref 30.0–36.0)
MCV: 96.2 fL (ref 80.0–100.0)
Monocytes Absolute: 0.4 K/uL (ref 0.1–1.0)
Monocytes Relative: 2 %
Neutro Abs: 16.5 K/uL — ABNORMAL HIGH (ref 1.7–7.7)
Neutrophils Relative %: 89 %
Platelets: 114 K/uL — ABNORMAL LOW (ref 150–400)
RBC: 3.72 MIL/uL — ABNORMAL LOW (ref 3.87–5.11)
RDW: 13.2 % (ref 11.5–15.5)
WBC Morphology: INCREASED
WBC: 18.6 K/uL — ABNORMAL HIGH (ref 4.0–10.5)
nRBC: 0.1 % (ref 0.0–0.2)

## 2024-06-17 LAB — BLOOD CULTURE ID PANEL (REFLEXED) - BCID2

## 2024-06-17 LAB — COMPREHENSIVE METABOLIC PANEL WITH GFR
ALT: 43 U/L (ref 0–44)
AST: 94 U/L — ABNORMAL HIGH (ref 15–41)
Albumin: 3 g/dL — ABNORMAL LOW (ref 3.5–5.0)
Alkaline Phosphatase: 71 U/L (ref 38–126)
Anion gap: 14 (ref 5–15)
BUN: 33 mg/dL — ABNORMAL HIGH (ref 8–23)
CO2: 20 mmol/L — ABNORMAL LOW (ref 22–32)
Calcium: 9.7 mg/dL (ref 8.9–10.3)
Chloride: 99 mmol/L (ref 98–111)
Creatinine, Ser: 1.59 mg/dL — ABNORMAL HIGH (ref 0.44–1.00)
GFR, Estimated: 31 mL/min — ABNORMAL LOW (ref >60)
Glucose, Bld: 244 mg/dL — ABNORMAL HIGH (ref 70–99)
Potassium: 4.2 mmol/L (ref 3.5–5.1)
Sodium: 133 mmol/L — ABNORMAL LOW (ref 135–145)
Total Bilirubin: 1 mg/dL (ref 0.0–1.2)
Total Protein: 7.7 g/dL (ref 6.5–8.1)

## 2024-06-17 LAB — MAGNESIUM: Magnesium: 1.6 mg/dL — ABNORMAL LOW (ref 1.7–2.4)

## 2024-06-17 LAB — GLUCOSE, CAPILLARY
Glucose-Capillary: 224 mg/dL — ABNORMAL HIGH (ref 70–99)
Glucose-Capillary: 221 mg/dL — ABNORMAL HIGH (ref 70–99)
Glucose-Capillary: 222 mg/dL — ABNORMAL HIGH (ref 70–99)
Glucose-Capillary: 126 mg/dL — ABNORMAL HIGH (ref 70–99)

## 2024-06-17 LAB — PROCALCITONIN: Procalcitonin: 38.51 ng/mL

## 2024-06-17 LAB — VITAMIN B12: Vitamin B-12: 609 pg/mL (ref 180–914)

## 2024-06-17 LAB — TSH: TSH: 1.314 u[IU]/mL (ref 0.350–4.500)

## 2024-06-17 LAB — BRAIN NATRIURETIC PEPTIDE: B Natriuretic Peptide: 894.9 pg/mL — ABNORMAL HIGH (ref 0.0–100.0)

## 2024-06-17 MED ORDER — SODIUM CHLORIDE 0.9 % IV SOLN
1.0000 g | Freq: Once | INTRAVENOUS | Status: AC
  Administered 2024-06-17: 1 g via INTRAVENOUS
  Filled 2024-06-17: qty 10

## 2024-06-17 MED ORDER — POTASSIUM CHLORIDE CRYS ER 20 MEQ PO TBCR
20.0000 meq | EXTENDED_RELEASE_TABLET | Freq: Once | ORAL | Status: AC
  Administered 2024-06-17: 20 meq via ORAL
  Filled 2024-06-17: qty 1

## 2024-06-17 MED ORDER — INSULIN ASPART 100 UNIT/ML IJ SOLN
0.0000 [IU] | Freq: Three times a day (TID) | INTRAMUSCULAR | Status: DC
  Administered 2024-06-17 (×3): 2 [IU] via SUBCUTANEOUS
  Administered 2024-06-18: 1 [IU] via SUBCUTANEOUS

## 2024-06-17 MED ORDER — ASPIRIN 81 MG PO TBEC
81.0000 mg | DELAYED_RELEASE_TABLET | Freq: Every day | ORAL | Status: DC
  Administered 2024-06-17 – 2024-06-21 (×5): 81 mg via ORAL
  Filled 2024-06-17 (×5): qty 1

## 2024-06-17 MED ORDER — LEVOTHYROXINE SODIUM 50 MCG PO TABS
50.0000 ug | ORAL_TABLET | Freq: Every day | ORAL | Status: DC
  Administered 2024-06-17 – 2024-06-22 (×6): 50 ug via ORAL
  Filled 2024-06-17 (×6): qty 1

## 2024-06-17 MED ORDER — ROSUVASTATIN CALCIUM 20 MG PO TABS
40.0000 mg | ORAL_TABLET | Freq: Every day | ORAL | Status: DC
  Administered 2024-06-17 – 2024-06-19 (×3): 40 mg via ORAL
  Filled 2024-06-17 (×3): qty 2

## 2024-06-17 MED ORDER — MAGNESIUM SULFATE 2 GM/50ML IV SOLN
2.0000 g | Freq: Once | INTRAVENOUS | Status: AC
  Administered 2024-06-17: 2 g via INTRAVENOUS
  Filled 2024-06-17: qty 50

## 2024-06-17 MED ORDER — SODIUM CHLORIDE 0.9 % IV SOLN
2.0000 g | INTRAVENOUS | Status: DC
  Administered 2024-06-18: 2 g via INTRAVENOUS
  Filled 2024-06-17 (×2): qty 20

## 2024-06-17 MED ORDER — INSULIN ASPART 100 UNIT/ML IJ SOLN: 0.0000 [IU] | Freq: Every day | INTRAMUSCULAR | Status: DC

## 2024-06-17 NOTE — Evaluation (Addendum)
 Occupational Therapy Evaluation Patient Details Name: Bonnie Chavez MRN: [id number] DOB: 1934-01-24 Today's Date: 06/17/2024   History of Present Illness   Pt is an 89 yo female presenting to [facility] ED on 06/16/24 with back pain and generalized weakness, further workup for sepsis. Imaging demonstrated no acute spinal abnormalities but does display moderate degenerative changes. PMH of chronic diastolic heart failure, CKD 3B, type 2 diabetes mellitus, essential pretension, hyperlipidemia, acquired hypothyroidism, severe aortic stenosis status post TAVR in November 2024.     Clinical Impressions Pt admitted for above, PTA pt reports being fully ind with her ADLs/iADLs driving and ambulating in the community with [facility] for longer distances but mostly no AD. Pt currently presents with generalized weakness and impaired balance needing max A for bed mobility. She is also limited by back pain, followed back precautions for comfort. Pt needing max A to CGA for ADLs and min A + RW for ambulation.  Pt would benefit from continued acute skilled OT services to address listed deficits and help transition to next level of care. Patient will benefit from continued inpatient follow up therapy, <3 hours/day, she demonstrates some potential to progress to [facility] level so OT will continue to reassess.      If plan is discharge home, recommend the following:   A little help with walking and/or transfers;A lot of help with bathing/dressing/bathroom;Assist for transportation;Assistance with cooking/housework     Functional Status Assessment   Patient has had a recent decline in their functional status and demonstrates the ability to make significant improvements in function in a reasonable and predictable amount of time.     Equipment Recommendations   Other (comment) (Pt reports having rec DME)     Recommendations for Other Services         Precautions/Restrictions   Precautions Precautions:  Fall Precaution/Restrictions Comments: back precautions for comfort. Restrictions Weight Bearing Restrictions Per Provider Order: No     Mobility Bed Mobility Overal bed mobility: Needs Assistance Bed Mobility: Rolling, Sidelying to Sit Rolling: Mod assist Sidelying to sit: Max assist       General bed mobility comments: mod A to roll to L, max A to transition trunk upright. Lots of back pain with sitting upright. Pt elbow propping on L side for comfort.    Transfers Overall transfer level: Needs assistance Equipment used: Rolling walker (2 wheels) Transfers: Sit to/from Stand Sit to Stand: From elevated surface, Min assist, +2 physical assistance           General transfer comment: min A +2 to raise bottom from EOB      Balance Overall balance assessment: Needs assistance Sitting-balance support: Feet supported Sitting balance-Leahy Scale: Poor Sitting balance - Comments: L lean sitting EOB, may be more elicited from back pain vs weakness Postural control: Left lateral lean Standing balance support: Reliant on assistive device for balance, Bilateral upper extremity supported Standing balance-Leahy Scale: Poor Standing balance comment: RW reliant                           ADL either performed or assessed with clinical judgement   ADL Overall ADL's : Needs assistance/impaired Eating/Feeding: Set up;Sitting   Grooming: Sitting;Contact guard assist;Wash/dry face Grooming Details (indicate cue type and reason): L elbow lean in sitting, helped with back pain Upper Body Bathing: Sitting;Contact guard assist   Lower Body Bathing: Sitting/lateral leans;Moderate assistance   Upper Body Dressing : Sitting;Set up   Lower Body Dressing: Sitting/lateral  leans;Maximal assistance   Toilet Transfer: Ambulation;Minimal assistance;+2 for physical assistance;+2 for safety/equipment;Rolling walker (2 wheels)   Toileting- Clothing Manipulation and Hygiene: Sit to/from  stand;Moderate assistance       Functional mobility during ADLs: Contact guard assist;Rolling walker (2 wheels) (pt progressed to CGA +1 for OOB)       Vision Baseline Vision/History: 1 Wears glasses Ability to See in Adequate Light: 1 Impaired Patient Visual Report: No change from baseline Vision Assessment?: No apparent visual deficits     Perception         Praxis         Pertinent Vitals/Pain Pain Assessment Pain Assessment: Faces Faces Pain Scale: Hurts even more Pain Location: back to abdomen L side Pain Descriptors / Indicators: Aching, Discomfort Pain Intervention(s): Monitored during session, Repositioned, Limited activity within patient's tolerance     Extremity/Trunk Assessment Upper Extremity Assessment Upper Extremity Assessment: Generalized weakness (ROM WFL)   Lower Extremity Assessment Lower Extremity Assessment: Defer to PT evaluation;Generalized weakness       Communication Communication Communication: No apparent difficulties   Cognition Arousal: Alert Behavior During Therapy: WFL for tasks assessed/performed Cognition: No apparent impairments                               Following commands: Intact       Cueing  General Comments   Cueing Techniques: Verbal cues  Sp02 90% on 1L with ambulation, 94% on 1L at rest following activity and pursed lip breathing.   Exercises     Shoulder Instructions      Home Living Family/patient expects to be discharged to:: Private residence Living Arrangements: Alone Available Help at Discharge: Family;Available PRN/intermittently (son and DIL stay for 4 mins away) Type of Home: House Home Access: Level entry     Home Layout: One level     Bathroom Shower/Tub: Chief Strategy Officer: Standard     Home Equipment: Control and instrumentation engineer (2 wheels);Cane - single point   Additional Comments: Pt denies any falls.      Prior Functioning/Environment  Prior Level of Function : Independent/Modified Independent;Driving             Mobility Comments: ind no AD, uses SPC in community. ADLs Comments: ind with ADLS.iADLs    OT Problem List: Decreased activity tolerance;Impaired balance (sitting and/or standing);Pain;Decreased strength;Decreased knowledge of use of DME or AE   OT Treatment/Interventions: Therapeutic exercise;Self-care/ADL training;Patient/family education;Balance training;Therapeutic activities;DME and/or AE instruction      OT Goals(Current goals can be found in the care plan section)   Acute Rehab OT Goals Patient Stated Goal: To get stronger in order to go home OT Goal Formulation: With patient Time For Goal Achievement: 07/01/24 Potential to Achieve Goals: Good ADL Goals Pt Will Perform Grooming: with modified independence;standing Pt Will Perform Lower Body Bathing: with modified independence;sitting/lateral leans;with adaptive equipment Pt Will Perform Lower Body Dressing: with modified independence;sitting/lateral leans;sit to/from stand Pt Will Transfer to Toilet: with modified independence;ambulating Pt Will Perform Toileting - Clothing Manipulation and hygiene: with modified independence;sit to/from stand Pt Will Perform Tub/Shower Transfer: Tub transfer;shower seat;with modified independence Additional ADL Goal #2: Pt will complete bed mobility with mod I using log roll to protect back for comfort.   OT Frequency:  Min 2X/week    Co-evaluation              AM-PAC OT 6 Clicks Daily Activity  Outcome Measure Help from another person eating meals?: A Little Help from another person taking care of personal grooming?: A Little Help from another person toileting, which includes using toliet, bedpan, or urinal?: A Lot Help from another person bathing (including washing, rinsing, drying)?: A Lot Help from another person to put on and taking off regular upper body clothing?: A Little Help from  another person to put on and taking off regular lower body clothing?: A Lot 6 Click Score: 15   End of Session Equipment Utilized During Treatment: Gait belt;Rolling walker (2 wheels);Oxygen (1L) Nurse Communication: Mobility status  Activity Tolerance: Patient tolerated treatment well Patient left: in chair;with call bell/phone within reach;with chair alarm set  OT Visit Diagnosis: Unsteadiness on feet (R26.81);Other abnormalities of gait and mobility (R26.89);Pain;Muscle weakness (generalized) (M62.81) Pain - part of body:  (back)                Time: 8871-8795 OT Time Calculation (min): 36 min Charges:  OT General Charges $OT Visit: 1 Visit OT Evaluation $OT Eval Moderate Complexity: 1 Mod OT Treatments $Therapeutic Activity: 8-22 mins  06/17/2024  AB, OTR/L  Acute Rehabilitation Services  Office: [phone]   Curtistine JONETTA Das 06/17/2024, 12:58 PM

## 2024-06-17 NOTE — Progress Notes (Signed)
 PROGRESS NOTE  Bonnie Chavez  FMW:[id number] DOB: 08-May-1934 DOA: 06/16/2024 PCP: Mercer Clotilda SAUNDERS, MD   Brief Narrative: Patient is a 89 year old female with history of chronic diastolic CHF, CKD stage IIIb with baseline creatinine of 1.4-1.7, type 2 diabetes, hypertension, hyperlipidemia hypothyroidism, severe aortic stenosis status post TAVR in November 2024 who presented to the emergency department with complaint of generalized weakness, dysuria for 1 to 2 days, subjective fever.  On presentation ,she was febrile with a stable blood pressure.  Lab work showed sodium of 131, potassium of 3.2, creatinine of 1.3, WBC count of 16, lactic acid of 2.7.  UA suspicious for UTI.  Blood culture, urine culture sent, patient started on ceftriaxone , IV fluids.  Patient being currently managed for severe sepsis secondary to acute cystitis.  Blood cultures showing E. coli.  Assessment & Plan:  Principal Problem:   Generalized weakness Active Problems:   DM2 (diabetes mellitus, type 2) (HCC)   Essential hypertension   HLD (hyperlipidemia)   Chronic diastolic CHF (congestive heart failure) (HCC)   CKD stage 3b, GFR 30-44 ml/min (HCC)   Severe sepsis (HCC)   Acute cystitis   Acquired hypothyroidism  Generalized weakness: Secondary to bacteremia/UTI.  PT/OT consulted.  TSH is normal.  Vitamin B12 pending.  Severe sepsis secondary to E. coli bacteremia/acute cystitis: Presented with fever, weakness, dysuria, leukocytosis, lactic acidosis.  Given gentle IV fluids.  Blood cultures now showing gram-negative rods. Currently on ceftriaxone .   Had a fever of 101 degrees this morning.  Blood pressure stable.  Lactic acidosis has resolved.  Leukocytosis persist  CKD stage IIIb: Baseline creatinine around 1.4-1.7.  Currently kidney function at baseline.  Follows with chronic kidney.  Hypokalemia: Supplemented and corrected  Chronic HFpEF: Last echo had shown EF of 55%, grade 1 diastolic dysfunction, normal right  ventricular function.  Currently appears euvolemic .Takes Lasix  at home.  Currently on hold.  Elevated BNP.  Diabetes type 2: Takes insulin  at home.  Monitor blood sugars.  Continue current insulin  regimen  Hyperlipidemia: On statin at home  Hypertension: Currently blood pressure stable.  Home beta-blocker, amlodipine  on hold.  Hypothyroidism: Continue Synthyroid         DVT prophylaxis:SCDs Start: 06/16/24 2350     Code Status: Full Code  Family Communication: Called son [name] twice on phone today for update,calls not received  Patient status:Inpatient  Patient is from :Home  Anticipated discharge un:[facility] vs SNF  Estimated DC date:1-2 days   Consultants: None  Procedures:None  Antimicrobials:  Anti-infectives (From admission, onward)    Start     Dose/Rate Route Frequency Ordered Stop   06/17/24 0000  cefTRIAXone  (ROCEPHIN ) 1 g in sodium chloride  0.9 % 100 mL IVPB        1 g 200 mL/hr over 30 Minutes Intravenous Every 24 hours 06/16/24 2351     06/16/24 1900  ceFEPIme  (MAXIPIME ) 2 g in sodium chloride  0.9 % 100 mL IVPB        2 g 200 mL/hr over 30 Minutes Intravenous  Once 06/16/24 1858 06/16/24 1958   06/16/24 1900  metroNIDAZOLE  (FLAGYL ) IVPB 500 mg        500 mg 100 mL/hr over 60 Minutes Intravenous  Once 06/16/24 1858 06/16/24 2203   06/16/24 1900  vancomycin  (VANCOCIN ) IVPB 1000 mg/200 mL premix        1,000 mg 200 mL/hr over 60 Minutes Intravenous  Once 06/16/24 1858 06/16/24 2225       Subjective: Patient seen and examined  at bedside today.  Hemodynamically stable.  She was comfortably lying on bed.  Eating her breakfast.  She denied any shortness of breath or cough.  Had mild fever this morning.  Denies abdomen pain, nausea or vomiting.  Alert and oriented.  Objective: Vitals:   06/17/24 0100 06/17/24 0140 06/17/24 0534 06/17/24 0729  BP: (!) 148/66 (!) 154/48 119/66 (!) 126/53  Pulse: 66 72 77 77  Resp: (!) 24 20 17 16   Temp:  99.6 F (37.6  C) 99.8 F (37.7 C) (!) 101 F (38.3 C)  TempSrc:  Oral    SpO2: 96% 94% 91% (!) 88%  Weight:      Height:        Intake/Output Summary (Last 24 hours) at 06/17/2024 0756 Last data filed at 06/17/2024 0122 Gross per 24 hour  Intake 1240.67 ml  Output --  Net 1240.67 ml   Filed Weights   06/16/24 1309  Weight: 84.8 kg    Examination:  General exam: Overall comfortable, not in distress, pleasant elderly female HEENT: PERRL Respiratory system:  no wheezes or crackles  Cardiovascular system: S1 & S2 heard, RRR.  Gastrointestinal system: Abdomen is nondistended, soft and nontender. Central nervous system: Alert and oriented Extremities: No edema, no clubbing ,no cyanosis Skin: No rashes, no ulcers,no icterus     Data Reviewed: I have personally reviewed following labs and imaging studies  CBC: Recent Labs  Lab 06/16/24 1645 06/17/24 0221  WBC 16.3* 18.6*  NEUTROABS 14.1* 16.5*  HGB 11.3* 12.0  HCT 33.7* 35.8*  MCV 95.5 96.2  PLT 124* 114*   Basic Metabolic Panel: Recent Labs  Lab 06/16/24 1645 06/17/24 0221  NA 131* 133*  K 3.4* 4.2  CL 98 99  CO2 22 20*  GLUCOSE 280* 244*  BUN 36* 33*  CREATININE 1.75* 1.59*  CALCIUM  9.8 9.7  MG  --  1.6*     Recent Results (from the past 240 hours)  Resp panel by RT-PCR (RSV, Flu A&B, Covid) Anterior Nasal Swab     Status: None   Collection Time: 06/16/24  4:36 PM   Specimen: Anterior Nasal Swab  Result Value Ref Range Status   SARS Coronavirus 2 by RT PCR NEGATIVE NEGATIVE Final   Influenza A by PCR NEGATIVE NEGATIVE Final   Influenza B by PCR NEGATIVE NEGATIVE Final    Comment: (NOTE) The Xpert Xpress SARS-CoV-2/FLU/RSV plus assay is intended as an aid in the diagnosis of influenza from Nasopharyngeal swab specimens and should not be used as a sole basis for treatment. Nasal washings and aspirates are unacceptable for Xpert Xpress SARS-CoV-2/FLU/RSV testing.  Fact Sheet for  Patients: BloggerCourse.com  Fact Sheet for Healthcare Providers: SeriousBroker.it  This test is not yet approved or cleared by the [place]  FDA and has been authorized for detection and/or diagnosis of SARS-CoV-2 by FDA under an Emergency Use Authorization (EUA). This EUA will remain in effect (meaning this test can be used) for the duration of the COVID-19 declaration under Section 564(b)(1) of the Act, 21 U.S.C. section 360bbb-3(b)(1), unless the authorization is terminated or revoked.     Resp Syncytial Virus by PCR NEGATIVE NEGATIVE Final    Comment: (NOTE) Fact Sheet for Patients: BloggerCourse.com  Fact Sheet for Healthcare Providers: SeriousBroker.it  This test is not yet approved or cleared by the [place]  FDA and has been authorized for detection and/or diagnosis of SARS-CoV-2 by FDA under an Emergency Use Authorization (EUA). This EUA will remain in effect (meaning this  test can be used) for the duration of the COVID-19 declaration under Section 564(b)(1) of the Act, 21 U.S.C. section 360bbb-3(b)(1), unless the authorization is terminated or revoked.  Performed at [facility], [address], [place], [place] [address]      Radiology Studies: CT Renal Stone Study Result Date: 06/16/2024 CLINICAL DATA:  Fever, back pain and hematuria. EXAM: CT ABDOMEN AND PELVIS WITHOUT CONTRAST TECHNIQUE: Multidetector CT imaging of the abdomen and pelvis was performed following the standard protocol without IV contrast. RADIATION DOSE REDUCTION: This exam was performed according to the departmental dose-optimization program which includes automated exposure control, adjustment of the mA and/or kV according to patient size and/or use of iterative reconstruction technique. COMPARISON:  CTA abdomen and pelvis 08/30/2023. MRI abdomen 10/25/2023. FINDINGS: Lower chest: No  acute abnormality. Hepatobiliary: No focal liver abnormality is seen. Status post cholecystectomy. No biliary dilatation. Pancreas: There are multiple cystic areas in the pancreas measuring up to 13 mm. These were more well defined on the prior study, but appear grossly unchanged. No acute inflammation identified. Spleen: Normal in size without focal abnormality. Adrenals/Urinary Tract: Adrenal glands are unremarkable. Kidneys are normal, without renal calculi, focal lesion, or hydronephrosis. Bladder is unremarkable. Stomach/Bowel: Stomach is within normal limits. There is a small hiatal hernia. Appendix appears normal. No evidence of bowel wall thickening, distention, or inflammatory changes. Vascular/Lymphatic: Aortic atherosclerosis. No enlarged abdominal or pelvic lymph nodes. Reproductive: Calcified uterine fibroids are present measuring up to 14 mm. Adnexa are within normal limits. Other: There is no ascites or free air. There is a small fat containing umbilical hernia. Fall circumscribed low-density areas in the mesentery are again seen in the central abdomen and left lower quadrant measuring up to 2.6 x 1.6 cm. These appear cystic and unchanged. Musculoskeletal: Severe degenerative changes affect the spine. IMPRESSION: 1. No acute localizing process in the abdomen or pelvis. 2. Stable cystic areas in the pancreas and mesentery. 3. Stable uterine fibroids. 4. Aortic atherosclerosis. Aortic Atherosclerosis (ICD10-I70.0). Electronically Signed   By: Greig Pique M.D.   On: 06/16/2024 21:32   DG Chest 1 View Result Date: 06/16/2024 CLINICAL DATA:  Fever.  Wheezing. EXAM: CHEST  1 VIEW COMPARISON:  Chest radiograph dated 10/01/2023 FINDINGS: A shallow inspiration. No focal consolidation, pleural effusion or pneumothorax. Mild cardiomegaly. Aortic valve repair. No acute osseous pathology. Bilateral shoulder arthroplasties. IMPRESSION: 1. No active disease. 2. Mild cardiomegaly. Electronically Signed   By:  Vanetta Chou M.D.   On: 06/16/2024 17:45   DG Lumbar Spine Complete Result Date: 06/16/2024 CLINICAL DATA:  low back pain 2 weeks, worse over the last 1 day. EXAM: LUMBAR SPINE - COMPLETE 4+ VIEW COMPARISON:  None Available. FINDINGS: There are 6 nonrib-bearing lumbar vertebrae. There is exaggerated lumbar lordosis. There is grade 1 anterolisthesis of L5 over S1 and L3 over L4. There is also grade 1 retrolisthesis of L2 over L3. no spondylolysis. Vertebral body heights are maintained. No aggressive osseous lesion. Moderate multilevel degenerative changes in the form of reduced intervertebral disc height, endplate sclerosis/irregularity, facet arthropathy and marginal osteophyte formation. Sacroiliac joints are symmetric. Visualized soft tissues are within normal limits. IMPRESSION: No acute osseous abnormality of the lumbar spine. Moderate multilevel degenerative changes. Electronically Signed   By: Ree Molt M.D.   On: 06/16/2024 14:09    Scheduled Meds:  aspirin  EC  81 mg Oral QHS   insulin  aspart  0-5 Units Subcutaneous QHS   insulin  aspart  0-6 Units Subcutaneous TID WC  levothyroxine   50 mcg Oral QAC breakfast   rosuvastatin   40 mg Oral Daily   Continuous Infusions:  cefTRIAXone  (ROCEPHIN )  IV Stopped (06/17/24 0122)   lactated ringers  75 mL/hr at 06/17/24 0159     LOS: 1 day   Rajah Tagliaferro, MD [facility] P7/26/2025, 7:56 AM

## 2024-06-17 NOTE — Evaluation (Signed)
 Physical Therapy Evaluation Patient Details Name: Bonnie Chavez MRN: [id number] DOB: 05/01/1934 Today's Date: 06/17/2024  History of Present Illness  Pt is an 89 yo female presenting to [facility] ED on 06/16/24 with back pain and generalized weakness, further workup for sepsis. Imaging demonstrated no acute spinal abnormalities but does display moderate degenerative changes. PMH of chronic diastolic heart failure, CKD 3B, type 2 diabetes mellitus, essential pretension, hyperlipidemia, acquired hypothyroidism, severe aortic stenosis status post TAVR in November 2024.  Clinical Impression  PTA pt reports living alone in single story home. Pt ambulates community distances with SPC but most of the time does not use AD. Pt was independent in ADLs and iADLs. Pt is currently limited in safe mobility by back pain and generalized weakness. Pt requires maxA for bed mobility, and minAx2 for transfers and short bout of ambulation. Patient will benefit from continued inpatient follow up therapy, <3 hours/day. PT will continue to follow acutely and refer to Mobility Specialist.          If plan is discharge home, recommend the following: A lot of help with walking and/or transfers;A lot of help with bathing/dressing/bathroom;Assistance with cooking/housework;Direct supervision/assist for medications management;Direct supervision/assist for financial management;Assist for transportation;Help with stairs or ramp for entrance;Supervision due to cognitive status   Can travel by private vehicle   No    Equipment Recommendations None recommended by PT     Functional Status Assessment Patient has had a recent decline in their functional status and demonstrates the ability to make significant improvements in function in a reasonable and predictable amount of time.     Precautions / Restrictions Precautions Precautions: Fall Precaution/Restrictions Comments: back precautions for comfort. Restrictions Weight Bearing  Restrictions Per Provider Order: No      Mobility  Bed Mobility Overal bed mobility: Needs Assistance Bed Mobility: Rolling, Sidelying to Sit Rolling: Mod assist Sidelying to sit: Max assist       General bed mobility comments: mod A to roll to L, max A to transition trunk upright. Lots of back pain with sitting upright. Pt elbow propping on L side for comfort.    Transfers Overall transfer level: Needs assistance Equipment used: Rolling walker (2 wheels) Transfers: Sit to/from Stand Sit to Stand: From elevated surface, Min assist, +2 physical assistance           General transfer comment: min A +2 to raise bottom from EOB    Ambulation/Gait Ambulation/Gait assistance: Min assist, +2 physical assistance Gait Distance (Feet): 12 Feet Assistive device: Rolling walker (2 wheels) Gait Pattern/deviations: Step-through pattern, Shuffle, Trunk flexed Gait velocity: slowed Gait velocity interpretation: <1.31 ft/sec, indicative of household ambulator Pre-gait activities: marching in place General Gait Details: min Ax2 progressing to minAx1, pt requiring constant cuing for proximity to RW and upright posture and physical A for steady and cues for upright posture and proximity to rRW      Balance Overall balance assessment: Needs assistance Sitting-balance support: Feet supported Sitting balance-Leahy Scale: Poor Sitting balance - Comments: L lean sitting EOB, may be more elicited from back pain vs weakness Postural control: Left lateral lean Standing balance support: Reliant on assistive device for balance, Bilateral upper extremity supported Standing balance-Leahy Scale: Poor Standing balance comment: RW reliant                             Pertinent Vitals/Pain Pain Assessment Pain Assessment: Faces Faces Pain Scale: Hurts even more Pain Location: back to  abdomen L side Pain Descriptors / Indicators: Aching, Discomfort Pain Intervention(s): Limited  activity within patient's tolerance, Monitored during session, Repositioned    Home Living Family/patient expects to be discharged to:: Private residence Living Arrangements: Alone Available Help at Discharge: Family;Available PRN/intermittently (son and DIL stay for 4 mins away) Type of Home: House Home Access: Level entry       Home Layout: One level Home Equipment: Control and instrumentation engineer (2 wheels);Cane - single point Additional Comments: Pt denies any falls.    Prior Function Prior Level of Function : Independent/Modified Independent;Driving             Mobility Comments: ind no AD, uses SPC in community. ADLs Comments: ind with ADLS.iADLs     Extremity/Trunk Assessment   Upper Extremity Assessment Upper Extremity Assessment: Defer to OT evaluation    Lower Extremity Assessment Lower Extremity Assessment: Generalized weakness (ROMWFL)    Cervical / Trunk Assessment Cervical / Trunk Assessment: Other exceptions (back pain)  Communication   Communication Communication: No apparent difficulties    Cognition Arousal: Alert Behavior During Therapy: WFL for tasks assessed/performed                             Following commands: Intact       Cueing Cueing Techniques: Verbal cues     General Comments General comments (skin integrity, edema, etc.): Sp02 90% on 1L with ambulation, 94% on 1L at rest following activity and pursed lip breathing.        Assessment/Plan    PT Assessment Patient needs continued PT services  PT Problem List Decreased strength;Decreased activity tolerance;Decreased balance;Decreased mobility;Decreased safety awareness;Decreased knowledge of use of DME;Pain       PT Treatment Interventions DME instruction;Gait training;Functional mobility training;Therapeutic activities;Therapeutic exercise;Balance training;Neuromuscular re-education;Cognitive remediation;Patient/family education    PT Goals (Current  goals can be found in the Care Plan section)  Acute Rehab PT Goals PT Goal Formulation: With patient Time For Goal Achievement: 07/01/24 Potential to Achieve Goals: Fair    Frequency Min 2X/week        AM-PAC PT 6 Clicks Mobility  Outcome Measure Help needed turning from your back to your side while in a flat bed without using bedrails?: A Lot Help needed moving from lying on your back to sitting on the side of a flat bed without using bedrails?: Total Help needed moving to and from a bed to a chair (including a wheelchair)?: Total Help needed standing up from a chair using your arms (e.g., wheelchair or bedside chair)?: Total Help needed to walk in hospital room?: Total Help needed climbing 3-5 steps with a railing? : Total 6 Click Score: 7    End of Session Equipment Utilized During Treatment: Gait belt Activity Tolerance: Patient limited by pain;Patient limited by fatigue Patient left: in chair;with call bell/phone within reach;with chair alarm set Nurse Communication: Mobility status PT Visit Diagnosis: Unsteadiness on feet (R26.81);Other abnormalities of gait and mobility (R26.89);Muscle weakness (generalized) (M62.81);Difficulty in walking, not elsewhere classified (R26.2)    Time: 8851-8795 PT Time Calculation (min) (ACUTE ONLY): 16 min   Charges:   PT Evaluation $PT Eval Low Complexity: 1 Low   PT General Charges $$ ACUTE PT VISIT: 1 Visit         Angelia Hazell B. Fleeta Lapidus PT, DPT Acute Rehabilitation Services Please use secure chat or  Call Office [phone]   Bonnie Chavez 06/17/2024, 1:41 PM

## 2024-06-17 NOTE — Progress Notes (Signed)
 Assumed care at 0700, pt lying in bed with eyes closed, even and unlabored breathing, skin warm and dry to the touch, call bell within reach and safety measure in place.

## 2024-06-17 NOTE — Plan of Care (Signed)
  Problem: Coping: Goal: Level of anxiety will decrease Outcome: Progressing   Problem: Pain Managment: Goal: General experience of comfort will improve and/or be controlled Outcome: Progressing   Problem: Skin Integrity: Goal: Risk for impaired skin integrity will decrease Outcome: Progressing

## 2024-06-17 NOTE — Plan of Care (Signed)

## 2024-06-17 NOTE — Progress Notes (Signed)
 PHARMACY - PHYSICIAN COMMUNICATION CRITICAL VALUE ALERT - BLOOD CULTURE IDENTIFICATION (BCID)  Bonnie Chavez is an 89 y.o. female who presented to [facility] on 06/16/2024 with a chief complaint of generalized weakness.   Assessment:  3/4 gram negative rods- E coli with no resistance detected; suspected urinary source  Name of physician (or Provider) Contacted: Ivonne Mustache, MD  Current antibiotics: ceftriaxone  1g IV x 24h   Changes to prescribed antibiotics recommended:  Ceftriaxone  2g IV every 24h  Recommendations accepted by provider  No results found for this or any previous visit.  Jenel Gierke M Marka Treloar 06/17/2024  10:26 AM

## 2024-06-18 ENCOUNTER — Inpatient Hospital Stay (HOSPITAL_COMMUNITY)

## 2024-06-18 DIAGNOSIS — R531 Weakness: Secondary | ICD-10-CM | POA: Diagnosis not present

## 2024-06-18 LAB — URINE CULTURE: Culture: 90000 — AB

## 2024-06-18 LAB — HEMOGLOBIN A1C
Hgb A1c MFr Bld: 7.8 % — ABNORMAL HIGH (ref 4.8–5.6)
Mean Plasma Glucose: 177.16 mg/dL

## 2024-06-18 LAB — BASIC METABOLIC PANEL WITH GFR
Anion gap: 9 (ref 5–15)
BUN: 47 mg/dL — ABNORMAL HIGH (ref 8–23)
CO2: 23 mmol/L (ref 22–32)
Calcium: 9.1 mg/dL (ref 8.9–10.3)
Chloride: 100 mmol/L (ref 98–111)
Creatinine, Ser: 2.6 mg/dL — ABNORMAL HIGH (ref 0.44–1.00)
GFR, Estimated: 17 mL/min — ABNORMAL LOW (ref >60)
Glucose, Bld: 235 mg/dL — ABNORMAL HIGH (ref 70–99)
Potassium: 3.8 mmol/L (ref 3.5–5.1)
Sodium: 132 mmol/L — ABNORMAL LOW (ref 135–145)

## 2024-06-18 LAB — CBC
HCT: 32.4 % — ABNORMAL LOW (ref 36.0–46.0)
Hemoglobin: 10.7 g/dL — ABNORMAL LOW (ref 12.0–15.0)
MCH: 32 pg (ref 26.0–34.0)
MCHC: 33 g/dL (ref 30.0–36.0)
MCV: 97 fL (ref 80.0–100.0)
Platelets: 92 K/uL — ABNORMAL LOW (ref 150–400)
RBC: 3.34 MIL/uL — ABNORMAL LOW (ref 3.87–5.11)
RDW: 13.6 % (ref 11.5–15.5)
WBC: 13.2 K/uL — ABNORMAL HIGH (ref 4.0–10.5)
nRBC: 0 % (ref 0.0–0.2)

## 2024-06-18 LAB — GLUCOSE, CAPILLARY
Glucose-Capillary: 191 mg/dL — ABNORMAL HIGH (ref 70–99)
Glucose-Capillary: 200 mg/dL — ABNORMAL HIGH (ref 70–99)
Glucose-Capillary: 277 mg/dL — ABNORMAL HIGH (ref 70–99)
Glucose-Capillary: 215 mg/dL — ABNORMAL HIGH (ref 70–99)

## 2024-06-18 MED ORDER — MAGNESIUM OXIDE -MG SUPPLEMENT 400 (240 MG) MG PO TABS
800.0000 mg | ORAL_TABLET | Freq: Once | ORAL | Status: AC
  Administered 2024-06-18: 800 mg via ORAL
  Filled 2024-06-18: qty 2

## 2024-06-18 MED ORDER — FUROSEMIDE 10 MG/ML IJ SOLN
40.0000 mg | Freq: Two times a day (BID) | INTRAMUSCULAR | Status: AC
  Administered 2024-06-18 – 2024-06-19 (×2): 40 mg via INTRAVENOUS
  Filled 2024-06-18 (×2): qty 4

## 2024-06-18 MED ORDER — INSULIN ASPART 100 UNIT/ML IJ SOLN
0.0000 [IU] | Freq: Three times a day (TID) | INTRAMUSCULAR | Status: DC
  Administered 2024-06-18: 8 [IU] via SUBCUTANEOUS

## 2024-06-18 MED ORDER — POLYETHYLENE GLYCOL 3350 17 G PO PACK
17.0000 g | PACK | Freq: Every day | ORAL | Status: DC
  Administered 2024-06-18 – 2024-06-21 (×4): 17 g via ORAL
  Filled 2024-06-18 (×5): qty 1

## 2024-06-18 MED ORDER — ENOXAPARIN SODIUM 30 MG/0.3ML IJ SOSY
30.0000 mg | PREFILLED_SYRINGE | INTRAMUSCULAR | Status: DC
  Administered 2024-06-18 – 2024-06-21 (×4): 30 mg via SUBCUTANEOUS
  Filled 2024-06-18 (×4): qty 0.3

## 2024-06-18 MED ORDER — SENNOSIDES-DOCUSATE SODIUM 8.6-50 MG PO TABS
1.0000 | ORAL_TABLET | Freq: Two times a day (BID) | ORAL | Status: DC
  Administered 2024-06-18: 1 via ORAL
  Filled 2024-06-18: qty 1

## 2024-06-18 NOTE — TOC Initial Note (Signed)
 Transition of Care [facility]) - Initial/Assessment Note    Patient Details  Name: Bonnie Chavez MRN: [id number] Date of Birth: 06-17-1934  Transition of [facility] CM/SW Contact:    Isaiah Public, LCSWA Phone Number: 06/18/2024, 12:43 PM  Clinical Narrative:                  CSW received consult for possible SNF placement at time of discharge. CSW spoke with patient and with patients permission patients son [name] who was at bedside regarding PT recommendation of SNF placement at time of discharge. Patient reports PTA she comes from home alone. Patient expressed understanding of PT recommendation and is agreeable to SNF placement at time of discharge. Patient gave CSW permission to fax out initial referral for SNF placement. CSW discussed insurance authorization process and will provide Medicare SNF ratings list with accepted SNF bed offers when available. No further questions reported at this time. CSW to continue to follow and assist with discharge planning needs.    Expected Discharge Plan: Skilled Nursing Facility Barriers to Discharge: Continued Medical Work up   Patient Goals and CMS Choice Patient states their goals for this hospitalization and ongoing recovery are:: SNF   Choice offered to / list presented to : Patient      Expected Discharge Plan and Services In-house Referral: Clinical Social Work     Living arrangements for the past 2 months: Single Family Home                                      Prior Living Arrangements/Services Living arrangements for the past 2 months: Single Family Home Lives with:: Self Patient language and need for interpreter reviewed:: Yes Do you feel safe going back to the place where you live?: No   SNF  Need for Family Participation in Patient Care: Yes (Comment) Care giver support system in place?: Yes (comment)   Criminal Activity/Legal Involvement Pertinent to Current Situation/Hospitalization: No - Comment as  needed  Activities of Daily Living   ADL Screening (condition at time of admission) Independently performs ADLs?: Yes (appropriate for developmental age) Is the patient deaf or have difficulty hearing?: Yes Does the patient have difficulty seeing, even when wearing glasses/contacts?: No Does the patient have difficulty concentrating, remembering, or making decisions?: No  Permission Sought/Granted Permission sought to share information with : Case Manager, Magazine features editor, Family Supports Permission granted to share information with : Yes, Verbal Permission Granted  Share Information with NAME: [name]  Permission granted to share info w AGENCY: SNF  Permission granted to share info w Relationship: Son  Permission granted to share info w Contact Information: Shakena Callari [phone]  Emotional Assessment Appearance:: Appears stated age Attitude/Demeanor/Rapport: Gracious Affect (typically observed): Calm Orientation: : Oriented to Self, Oriented to Place, Oriented to  Time, Oriented to Situation Alcohol / Substance Use: Not Applicable Psych Involvement: No (comment)  Admission diagnosis:  Generalized weakness [R53.1] Sepsis, due to unspecified organism, unspecified whether acute organ dysfunction present [facility]) [A41.9] Patient Active Problem List   Diagnosis Date Noted   Severe sepsis (HCC) 06/17/2024   Acute cystitis 06/17/2024   Acquired hypothyroidism 06/17/2024   Generalized weakness 06/16/2024   1st degree AV block 10/06/2023   S/P TAVR (transcatheter aortic valve replacement) 10/05/2023   Severe aortic stenosis    Carpal tunnel syndrome    Neuropathy    (HFpEF) heart failure with preserved ejection fraction (  HCC)    CKD stage 3b, GFR 30-44 ml/min (HCC)    Dysfunction of right eustachian tube 09/17/2020   Excessive cerumen in ear canal, bilateral 02/01/2018   Sensorineural hearing loss (SNHL), bilateral 01/19/2017   Hyperparathyroidism (HCC)  02/27/2016   Chronic low back pain 02/27/2016   Morbid obesity (HCC) 02/27/2016   Chronic diastolic CHF (congestive heart failure) (HCC) 10/28/2015   Chronic kidney disease 05/24/2014   Essential hypertension 08/21/2013   HLD (hyperlipidemia) 08/21/2013   PMB (postmenopausal bleeding) 05/15/2013   Multiple thyroid  nodules 03/09/2013   Vitamin D  deficiency 03/09/2013   Osteoarthritis 02/06/2010   Vaginal atrophy 01/12/2002    Class: History of   DM2 (diabetes mellitus, type 2) (HCC) 09/07/2001    Class: History of   PCP:  Mercer Clotilda SAUNDERS, MD Pharmacy:   [facility] [phone] [address], [place] - [address] [place] [address] Phone: [phone] Fax: [phone]     Social Drivers of Health (SDOH) Social History: SDOH Screenings   Food Insecurity: No Food Insecurity (06/17/2024)  Housing: Low Risk  (06/17/2024)  Transportation Needs: No Transportation Needs (06/17/2024)  Utilities: Not At Risk (06/17/2024)  Alcohol Screen: Low Risk  (11/06/2022)  Depression (PHQ2-9): Low Risk  (04/03/2024)  Financial Resource Strain: Low Risk  (11/06/2022)  Physical Activity: Insufficiently Active (11/06/2022)  Social Connections: Moderately Integrated (06/17/2024)  Stress: No Stress Concern Present (11/06/2022)  Tobacco Use: Low Risk  (06/17/2024)   SDOH Interventions:     Readmission Risk Interventions     No data to display

## 2024-06-18 NOTE — Plan of Care (Signed)
   Problem: Education: Goal: Knowledge of General Education information will improve Description Including pain rating scale, medication(s)/side effects and non-pharmacologic comfort measures Outcome: Progressing

## 2024-06-18 NOTE — Progress Notes (Signed)
 PROGRESS NOTE  Bonnie Chavez  FMW:[id number] DOB: March 03, 1934 DOA: 06/16/2024 PCP: Mercer Clotilda SAUNDERS, MD   Brief Narrative: Patient is a 89 year old female with history of chronic diastolic CHF, CKD stage IIIb with baseline creatinine of 1.4-1.7, type 2 diabetes, hypertension, hyperlipidemia hypothyroidism, severe aortic stenosis status post TAVR in November 2024 who presented to the emergency department with complaint of generalized weakness, dysuria for 1 to 2 days, subjective fever.  On presentation ,she was febrile with a stable blood pressure.  Lab work showed sodium of 131, potassium of 3.2, creatinine of 1.3, WBC count of 16, lactic acid of 2.7.  UA suspicious for UTI.  Blood culture, urine culture sent, patient started on ceftriaxone , IV fluids.  Patient being currently managed for severe sepsis secondary to acute cystitis.  Blood cultures showed E. coli.  PT is recommending SNF on discharge  Assessment & Plan:  Principal Problem:   Generalized weakness Active Problems:   DM2 (diabetes mellitus, type 2) (HCC)   Essential hypertension   HLD (hyperlipidemia)   Chronic diastolic CHF (congestive heart failure) (HCC)   CKD stage 3b, GFR 30-44 ml/min (HCC)   Severe sepsis (HCC)   Acute cystitis   Acquired hypothyroidism  Generalized weakness: Secondary to bacteremia/UTI.  PT/OT consulted.  TSH is normal.  Vitamin B12 normal  Severe sepsis secondary to E. coli bacteremia/acute cystitis: Presented with fever, weakness, dysuria, leukocytosis, lactic acidosis.  Given gentle IV fluids.  Blood cultures urine culture showing E. coli.  Currently on ceftriaxone .  Well this morning lactic acidosis has resolved.  Leukocytosis improving, follow-up final culture and sensitivity  CKD stage IIIb: Baseline creatinine around 1.4-1.7.  Follows with [facility].  Creatinine bumped up to the range of 2 today.  Still looks volume overloaded.  Has crackles on bilateral bases.  Checking chest x-ray.  Will give  her 2 dose of Lasix .  Elevated BNP  Acute hypoxic respiratory failure: Currently requiring 1 to 2 L of oxygen per minute.  Complains of dyspnea on exertion.  Chest x-ray pending.  Giving Lasix   Hypokalemia: Supplemented and corrected  Chronic HFpEF: Last echo had shown EF of 55%, grade 1 diastolic dysfunction, normal right ventricular function.  Currently appears euvolemic .Takes Lasix  at home.  Elevated BNP.  Appears volume overloaded today, being given 2 doses  of IV Lasix .  Diabetes type 2: Takes insulin  at home.  Monitor blood sugars.  Continue current insulin  regimen.Last Hb A1c of 7.6  Hyperlipidemia: On statin at home  Hypertension: Currently blood pressure soft/stable.  Home beta-blocker, amlodipine  on hold.  Hypothyroidism: Continue Synthyroid  Debility/deconditioning: Patient seen by PT and OT and recommended SNF on discharge         DVT prophylaxis:SCDs Start: 06/16/24 2350     Code Status: Full Code  Family Communication: Called son [name] and discussed about management plan on  7/27  Patient status:Inpatient  Patient is from :Home  Anticipated discharge to: SNF  Estimated DC date:1-2 days   Consultants: None  Procedures:None  Antimicrobials:  Anti-infectives (From admission, onward)    Start     Dose/Rate Route Frequency Ordered Stop   06/18/24 1000  cefTRIAXone  (ROCEPHIN ) 2 g in sodium chloride  0.9 % 100 mL IVPB        2 g 200 mL/hr over 30 Minutes Intravenous Every 24 hours 06/17/24 1032     06/17/24 1130  cefTRIAXone  (ROCEPHIN ) 1 g in sodium chloride  0.9 % 100 mL IVPB        1 g 200  mL/hr over 30 Minutes Intravenous  Once 06/17/24 1034 06/17/24 1314   06/17/24 0000  cefTRIAXone  (ROCEPHIN ) 1 g in sodium chloride  0.9 % 100 mL IVPB  Status:  Discontinued        1 g 200 mL/hr over 30 Minutes Intravenous Every 24 hours 06/16/24 2351 06/17/24 1032   06/16/24 1900  ceFEPIme  (MAXIPIME ) 2 g in sodium chloride  0.9 % 100 mL IVPB        2 g 200 mL/hr over  30 Minutes Intravenous  Once 06/16/24 1858 06/16/24 1958   06/16/24 1900  metroNIDAZOLE  (FLAGYL ) IVPB 500 mg        500 mg 100 mL/hr over 60 Minutes Intravenous  Once 06/16/24 1858 06/16/24 2203   06/16/24 1900  vancomycin  (VANCOCIN ) IVPB 1000 mg/200 mL premix        1,000 mg 200 mL/hr over 60 Minutes Intravenous  Once 06/16/24 1858 06/16/24 2225       Subjective: Patient seen and examined at bedside today.  Overall comfortable.  On 2 L of oxygen during my evaluation.  Does not complain of shortness of breath while at rest but on ambulation.  Complains of some pain on the left upper quadrant today.  Abdomen is soft and nontender.  Objective: Vitals:   06/17/24 1956 06/18/24 0042 06/18/24 0457 06/18/24 0814  BP: (!) 126/51 (!) 119/42 (!) 127/51 101/75  Pulse: 71 64 65 62  Resp: 19 19 18 16   Temp: 100 F (37.8 C) 98.1 F (36.7 C) 98 F (36.7 C) 98.6 F (37 C)  TempSrc:      SpO2: 100% 98% 99% 99%  Weight:      Height:        Intake/Output Summary (Last 24 hours) at 06/18/2024 1146 Last data filed at 06/18/2024 9470 Gross per 24 hour  Intake 480 ml  Output 600 ml  Net -120 ml   Filed Weights   06/16/24 1309  Weight: 84.8 kg    Examination:   General exam: Overall comfortable, not in distress, pleasant elderly female HEENT: PERRL Respiratory system: Crackles in bilateral bases Cardiovascular system: S1 & S2 heard, RRR.  Gastrointestinal system: Abdomen is nondistended, soft and nontender. Central nervous system: Alert and oriented Extremities: No edema, no clubbing ,no cyanosis Skin: No rashes, no ulcers,no icterus     Data Reviewed: I have personally reviewed following labs and imaging studies  CBC: Recent Labs  Lab 06/16/24 1645 06/17/24 0221 06/18/24 0307  WBC 16.3* 18.6* 13.2*  NEUTROABS 14.1* 16.5*  --   HGB 11.3* 12.0 10.7*  HCT 33.7* 35.8* 32.4*  MCV 95.5 96.2 97.0  PLT 124* 114* 92*   Basic Metabolic Panel: Recent Labs  Lab 06/16/24 1645  06/17/24 0221 06/18/24 1012  NA 131* 133* 132*  K 3.4* 4.2 3.8  CL 98 99 100  CO2 22 20* 23  GLUCOSE 280* 244* 235*  BUN 36* 33* 47*  CREATININE 1.75* 1.59* 2.60*  CALCIUM  9.8 9.7 9.1  MG  --  1.6*  --      Recent Results (from the past 240 hours)  Resp panel by RT-PCR (RSV, Flu A&B, Covid) Anterior Nasal Swab     Status: None   Collection Time: 06/16/24  4:36 PM   Specimen: Anterior Nasal Swab  Result Value Ref Range Status   SARS Coronavirus 2 by RT PCR NEGATIVE NEGATIVE Final   Influenza A by PCR NEGATIVE NEGATIVE Final   Influenza B by PCR NEGATIVE NEGATIVE Final    Comment: (  NOTE) The Xpert Xpress SARS-CoV-2/FLU/RSV plus assay is intended as an aid in the diagnosis of influenza from Nasopharyngeal swab specimens and should not be used as a sole basis for treatment. Nasal washings and aspirates are unacceptable for Xpert Xpress SARS-CoV-2/FLU/RSV testing.  Fact Sheet for Patients: BloggerCourse.com  Fact Sheet for Healthcare Providers: SeriousBroker.it  This test is not yet approved or cleared by the [place]  FDA and has been authorized for detection and/or diagnosis of SARS-CoV-2 by FDA under an Emergency Use Authorization (EUA). This EUA will remain in effect (meaning this test can be used) for the duration of the COVID-19 declaration under Section 564(b)(1) of the Act, 21 U.S.C. section 360bbb-3(b)(1), unless the authorization is terminated or revoked.     Resp Syncytial Virus by PCR NEGATIVE NEGATIVE Final    Comment: (NOTE) Fact Sheet for Patients: BloggerCourse.com  Fact Sheet for Healthcare Providers: SeriousBroker.it  This test is not yet approved or cleared by the [place]  FDA and has been authorized for detection and/or diagnosis of SARS-CoV-2 by FDA under an Emergency Use Authorization (EUA). This EUA will remain in effect (meaning this  test can be used) for the duration of the COVID-19 declaration under Section 564(b)(1) of the Act, 21 U.S.C. section 360bbb-3(b)(1), unless the authorization is terminated or revoked.  Performed at [facility], [address], [place], [place] [address]   Urine Culture     Status: Abnormal   Collection Time: 06/16/24  4:36 PM   Specimen: Urine, Clean Catch  Result Value Ref Range Status   Specimen Description URINE, CLEAN CATCH  Final   Special Requests   Final    NONE Performed at [facility], [address], [place], [place] [address]    Culture 90,000 COLONIES/mL ESCHERICHIA COLI (A)  Final   Report Status 06/18/2024 FINAL  Final   Organism ID, Bacteria ESCHERICHIA COLI (A)  Final      Susceptibility   Escherichia coli - MIC*    AMPICILLIN 8 SENSITIVE Sensitive     CEFAZOLIN  <=4 SENSITIVE Sensitive     CEFEPIME  <=0.12 SENSITIVE Sensitive     CEFTRIAXONE  <=0.25 SENSITIVE Sensitive     CIPROFLOXACIN <=0.25 SENSITIVE Sensitive     GENTAMICIN <=1 SENSITIVE Sensitive     IMIPENEM <=0.25 SENSITIVE Sensitive     NITROFURANTOIN <=16 SENSITIVE Sensitive     TRIMETH/SULFA <=20 SENSITIVE Sensitive     AMPICILLIN/SULBACTAM 4 SENSITIVE Sensitive     PIP/TAZO <=4 SENSITIVE Sensitive ug/mL    * 90,000 COLONIES/mL ESCHERICHIA COLI  Blood culture (routine x 2)     Status: None (Preliminary result)   Collection Time: 06/16/24  4:45 PM   Specimen: BLOOD  Result Value Ref Range Status   Specimen Description BLOOD LEFT ANTECUBITAL  Final   Special Requests   Final    BOTTLES DRAWN AEROBIC AND ANAEROBIC Blood Culture results may not be optimal due to an inadequate volume of blood received in culture bottles   Culture  Setup Time   Final    GRAM NEGATIVE RODS IN BOTH AEROBIC AND ANAEROBIC BOTTLES CRITICAL VALUE NOTED.  VALUE IS CONSISTENT WITH PREVIOUSLY REPORTED AND CALLED VALUE.    Culture   Final    GRAM NEGATIVE RODS IDENTIFICATION TO FOLLOW Performed at [facility], [address] [place], [place] [address]    Report Status PENDING  Incomplete  Blood culture (routine x 2)     Status: Abnormal (Preliminary result)   Collection Time:  06/16/24  6:00 PM   Specimen: BLOOD  Result Value Ref Range Status   Specimen Description BLOOD SITE NOT SPECIFIED  Final   Special Requests   Final    BOTTLES DRAWN AEROBIC AND ANAEROBIC Blood Culture adequate volume   Culture  Setup Time   Final    GRAM NEGATIVE RODS ANAEROBIC BOTTLE ONLY CRITICAL RESULT CALLED TO, READ BACK BY AND VERIFIED WITH: PHARMD JADE DENNINGER [id number] BY EC    Culture (A)  Final    ESCHERICHIA COLI SUSCEPTIBILITIES TO FOLLOW Performed at [facility], [address], [place], [place] [address]    Report Status PENDING  Incomplete  Blood Culture ID Panel (Reflexed)     Status: Abnormal   Collection Time: 06/16/24  6:00 PM  Result Value Ref Range Status   Enterococcus faecalis NOT DETECTED NOT DETECTED Final   Enterococcus Faecium NOT DETECTED NOT DETECTED Final   Listeria monocytogenes NOT DETECTED NOT DETECTED Final   Staphylococcus species NOT DETECTED NOT DETECTED Final   Staphylococcus aureus (BCID) NOT DETECTED NOT DETECTED Final   Staphylococcus epidermidis NOT DETECTED NOT DETECTED Final   Staphylococcus lugdunensis NOT DETECTED NOT DETECTED Final   Streptococcus species NOT DETECTED NOT DETECTED Final   Streptococcus agalactiae NOT DETECTED NOT DETECTED Final   Streptococcus pneumoniae NOT DETECTED NOT DETECTED Final   Streptococcus pyogenes NOT DETECTED NOT DETECTED Final   A.calcoaceticus-baumannii NOT DETECTED NOT DETECTED Final   Bacteroides fragilis NOT DETECTED NOT DETECTED Final   Enterobacterales DETECTED (A) NOT DETECTED Final    Comment: Enterobacterales represent a large order of gram negative bacteria, not a single organism. CRITICAL RESULT CALLED TO, READ BACK BY AND VERIFIED WITH: PHARMD JADE DENNINGER [id number] AT 1025 BY EC     Enterobacter cloacae complex NOT DETECTED NOT DETECTED Final   Escherichia coli DETECTED (A) NOT DETECTED Final    Comment: CRITICAL RESULT CALLED TO, READ BACK BY AND VERIFIED WITH: PHARMD JADE DENNINGER [id number] AT 1025 BY EC    Klebsiella aerogenes NOT DETECTED NOT DETECTED Final   Klebsiella oxytoca NOT DETECTED NOT DETECTED Final   Klebsiella pneumoniae NOT DETECTED NOT DETECTED Final   Proteus species NOT DETECTED NOT DETECTED Final   Salmonella species NOT DETECTED NOT DETECTED Final   Serratia marcescens NOT DETECTED NOT DETECTED Final   Haemophilus influenzae NOT DETECTED NOT DETECTED Final   Neisseria meningitidis NOT DETECTED NOT DETECTED Final   Pseudomonas aeruginosa NOT DETECTED NOT DETECTED Final   Stenotrophomonas maltophilia NOT DETECTED NOT DETECTED Final   Candida albicans NOT DETECTED NOT DETECTED Final   Candida auris NOT DETECTED NOT DETECTED Final   Candida glabrata NOT DETECTED NOT DETECTED Final   Candida krusei NOT DETECTED NOT DETECTED Final   Candida parapsilosis NOT DETECTED NOT DETECTED Final   Candida tropicalis NOT DETECTED NOT DETECTED Final   Cryptococcus neoformans/gattii NOT DETECTED NOT DETECTED Final   CTX-M ESBL NOT DETECTED NOT DETECTED Final   Carbapenem resistance IMP NOT DETECTED NOT DETECTED Final   Carbapenem resistance KPC NOT DETECTED NOT DETECTED Final   Carbapenem resistance NDM NOT DETECTED NOT DETECTED Final   Carbapenem resist OXA 48 LIKE NOT DETECTED NOT DETECTED Final   Carbapenem resistance VIM NOT DETECTED NOT DETECTED Final    Comment: Performed at [facility], [address] [place], [place] [address]     Radiology Studies: CT Renal Stone Study Result Date: 06/16/2024 CLINICAL DATA:  Fever, back pain and hematuria. EXAM: CT ABDOMEN AND  PELVIS WITHOUT CONTRAST TECHNIQUE: Multidetector CT imaging of the abdomen and pelvis was performed following the standard protocol without IV contrast. RADIATION DOSE REDUCTION: This  exam was performed according to the departmental dose-optimization program which includes automated exposure control, adjustment of the mA and/or kV according to patient size and/or use of iterative reconstruction technique. COMPARISON:  CTA abdomen and pelvis 08/30/2023. MRI abdomen 10/25/2023. FINDINGS: Lower chest: No acute abnormality. Hepatobiliary: No focal liver abnormality is seen. Status post cholecystectomy. No biliary dilatation. Pancreas: There are multiple cystic areas in the pancreas measuring up to 13 mm. These were more well defined on the prior study, but appear grossly unchanged. No acute inflammation identified. Spleen: Normal in size without focal abnormality. Adrenals/Urinary Tract: Adrenal glands are unremarkable. Kidneys are normal, without renal calculi, focal lesion, or hydronephrosis. Bladder is unremarkable. Stomach/Bowel: Stomach is within normal limits. There is a small hiatal hernia. Appendix appears normal. No evidence of bowel wall thickening, distention, or inflammatory changes. Vascular/Lymphatic: Aortic atherosclerosis. No enlarged abdominal or pelvic lymph nodes. Reproductive: Calcified uterine fibroids are present measuring up to 14 mm. Adnexa are within normal limits. Other: There is no ascites or free air. There is a small fat containing umbilical hernia. Fall circumscribed low-density areas in the mesentery are again seen in the central abdomen and left lower quadrant measuring up to 2.6 x 1.6 cm. These appear cystic and unchanged. Musculoskeletal: Severe degenerative changes affect the spine. IMPRESSION: 1. No acute localizing process in the abdomen or pelvis. 2. Stable cystic areas in the pancreas and mesentery. 3. Stable uterine fibroids. 4. Aortic atherosclerosis. Aortic Atherosclerosis (ICD10-I70.0). Electronically Signed   By: Greig Pique M.D.   On: 06/16/2024 21:32   DG Chest 1 View Result Date: 06/16/2024 CLINICAL DATA:  Fever.  Wheezing. EXAM: CHEST  1 VIEW  COMPARISON:  Chest radiograph dated 10/01/2023 FINDINGS: A shallow inspiration. No focal consolidation, pleural effusion or pneumothorax. Mild cardiomegaly. Aortic valve repair. No acute osseous pathology. Bilateral shoulder arthroplasties. IMPRESSION: 1. No active disease. 2. Mild cardiomegaly. Electronically Signed   By: Vanetta Chou M.D.   On: 06/16/2024 17:45   DG Lumbar Spine Complete Result Date: 06/16/2024 CLINICAL DATA:  low back pain 2 weeks, worse over the last 1 day. EXAM: LUMBAR SPINE - COMPLETE 4+ VIEW COMPARISON:  None Available. FINDINGS: There are 6 nonrib-bearing lumbar vertebrae. There is exaggerated lumbar lordosis. There is grade 1 anterolisthesis of L5 over S1 and L3 over L4. There is also grade 1 retrolisthesis of L2 over L3. no spondylolysis. Vertebral body heights are maintained. No aggressive osseous lesion. Moderate multilevel degenerative changes in the form of reduced intervertebral disc height, endplate sclerosis/irregularity, facet arthropathy and marginal osteophyte formation. Sacroiliac joints are symmetric. Visualized soft tissues are within normal limits. IMPRESSION: No acute osseous abnormality of the lumbar spine. Moderate multilevel degenerative changes. Electronically Signed   By: Ree Molt M.D.   On: 06/16/2024 14:09    Scheduled Meds:  aspirin  EC  81 mg Oral QHS   insulin  aspart  0-5 Units Subcutaneous QHS   insulin  aspart  0-6 Units Subcutaneous TID WC   levothyroxine   50 mcg Oral QAC breakfast   polyethylene glycol  17 g Oral Daily   rosuvastatin   40 mg Oral Daily   senna-docusate  1 tablet Oral BID   Continuous Infusions:  cefTRIAXone  (ROCEPHIN )  IV 2 g (06/18/24 1024)     LOS: 2 days   Ivonne Mustache, MD [facility] P7/27/2025, 11:46 AM

## 2024-06-18 NOTE — Progress Notes (Signed)
   06/18/24 1505  Mobility  Activity Transferred from bed to chair  Level of Assistance Moderate assist, patient does 50-74%  Assistive Device Front wheel walker  Activity Response Tolerated fair  Mobility Referral Yes  Mobility visit 1 Mobility  Mobility Specialist Start Time (ACUTE ONLY) 1700  Mobility Specialist Stop Time (ACUTE ONLY) 1530  Mobility Specialist Time Calculation (min) (ACUTE ONLY) 1350 min   Mobility Specialist: Progress Note  Pre-Mobility:      HR 68, SpO2 94%1L Post-Mobility:    HR 66, SpO2 92% 1L  Pt agreeable to mobility session - received in bed. C/o LLQ abd pain rated 7/10. Returned to chair with all needs met - call bell within reach. Chair alarm on.   Virgle Boards, BS Mobility Specialist Please contact via SecureChat or  Rehab office at [phone].

## 2024-06-18 NOTE — NC FL2 (Signed)
 [place]  MEDICAID FL2 LEVEL OF CARE FORM     IDENTIFICATION  Patient Name: Bonnie Chavez Birthdate: 04/10/1934 Sex: female Admission Date (Current Location): 06/16/2024  [facility] and [place] Number:  Producer, television/film/video and Address:  [facility] [place], [address], [place], [place] [address]      Provider Number: [id number]  Attending Physician Name and Address:  Jillian Buttery, MD  Relative Name and Phone Number:  Kamylah Manzo (son) [phone]    Current Level of Care: Hospital Recommended Level of Care: Skilled Nursing Facility Prior Approval Number:    Date Approved/Denied:   PASRR Number: [id number]  Discharge Plan: SNF    Current Diagnoses: Patient Active Problem List   Diagnosis Date Noted   Severe sepsis (HCC) 06/17/2024   Acute cystitis 06/17/2024   Acquired hypothyroidism 06/17/2024   Generalized weakness 06/16/2024   1st degree AV block 10/06/2023   S/P TAVR (transcatheter aortic valve replacement) 10/05/2023   Severe aortic stenosis    Carpal tunnel syndrome    Neuropathy    (HFpEF) heart failure with preserved ejection fraction (HCC)    CKD stage 3b, GFR 30-44 ml/min (HCC)    Dysfunction of right eustachian tube 09/17/2020   Excessive cerumen in ear canal, bilateral 02/01/2018   Sensorineural hearing loss (SNHL), bilateral 01/19/2017   Hyperparathyroidism (HCC) 02/27/2016   Chronic low back pain 02/27/2016   Morbid obesity (HCC) 02/27/2016   Chronic diastolic CHF (congestive heart failure) (HCC) 10/28/2015   Chronic kidney disease 05/24/2014   Essential hypertension 08/21/2013   HLD (hyperlipidemia) 08/21/2013   PMB (postmenopausal bleeding) 05/15/2013   Multiple thyroid  nodules 03/09/2013   Vitamin D  deficiency 03/09/2013   Osteoarthritis 02/06/2010   Vaginal atrophy 01/12/2002   DM2 (diabetes mellitus, type 2) (HCC) 09/07/2001    Orientation RESPIRATION BLADDER Height & Weight     Self, Time, Situation, Place   Normal Continent Weight: 187 lb (84.8 kg) Height:  5' 5 (165.1 cm)  BEHAVIORAL SYMPTOMS/MOOD NEUROLOGICAL BOWEL NUTRITION STATUS      Continent Diet (Please see discharge summary)  AMBULATORY STATUS COMMUNICATION OF NEEDS Skin   Extensive Assist Verbally Other (Comment) (Wound/Incision LDAs)                       Personal Care Assistance Level of Assistance  Bathing, Dressing, Feeding Bathing Assistance: Maximum assistance Feeding assistance: Limited assistance Dressing Assistance: Maximum assistance     Functional Limitations Info  Sight, Hearing, Speech Sight Info: Impaired (eyeglasses) Hearing Info: Impaired Speech Info: Adequate    SPECIAL CARE FACTORS FREQUENCY  PT (By licensed PT), OT (By licensed OT)     PT Frequency: 5x min weekly OT Frequency: 5x min weekly            Contractures Contractures Info: Not present    Additional Factors Info  Code Status, Allergies, Insulin  Sliding Scale Code Status Info: FULL Allergies Info: Lisinopril ,Nsaids,Penicillins   Insulin  Sliding Scale Info: insulin  aspart (novoLOG ) injection 0-15 Units 3 times daily with meals       Current Medications (06/18/2024):  This is the current hospital active medication list Current Facility-Administered Medications  Medication Dose Route Frequency Provider Last Rate Last Admin   acetaminophen  (TYLENOL ) tablet 650 mg  650 mg Oral Q6H PRN Howerter, Justin B, DO   650 mg at 06/18/24 0608   Or   acetaminophen  (TYLENOL ) suppository 650 mg  650 mg Rectal Q6H PRN Howerter, Justin B, DO  aspirin  EC tablet 81 mg  81 mg Oral QHS Howerter, Justin B, DO   81 mg at 06/17/24 2117   cefTRIAXone  (ROCEPHIN ) 2 g in sodium chloride  0.9 % 100 mL IVPB  2 g Intravenous Q24H Jillian Buttery, MD 200 mL/hr at 06/18/24 1024 2 g at 06/18/24 1024   furosemide  (LASIX ) injection 40 mg  40 mg Intravenous Q12H Jillian Buttery, MD   40 mg at 06/18/24 1248   insulin  aspart (novoLOG ) injection 0-15 Units  0-15  Units Subcutaneous TID WC Jillian Buttery, MD       levothyroxine  (SYNTHROID ) tablet 50 mcg  50 mcg Oral QAC breakfast Howerter, Justin B, DO   50 mcg at 06/18/24 9390   melatonin tablet 3 mg  3 mg Oral QHS PRN Howerter, Justin B, DO       ondansetron  (ZOFRAN ) injection 4 mg  4 mg Intravenous Q6H PRN Howerter, Justin B, DO       polyethylene glycol (MIRALAX  / GLYCOLAX ) packet 17 g  17 g Oral Daily Adhikari, Amrit, MD   17 g at 06/18/24 1020   rosuvastatin  (CRESTOR ) tablet 40 mg  40 mg Oral Daily Howerter, Justin B, DO   40 mg at 06/18/24 1020   senna-docusate (Senokot-S) tablet 1 tablet  1 tablet Oral BID Jillian Buttery, MD   1 tablet at 06/18/24 1020     Discharge Medications: Please see discharge summary for a list of discharge medications.  Relevant Imaging Results:  Relevant Lab Results:   Additional Information SSN-[id number]  Isaiah Public, LCSWA

## 2024-06-19 ENCOUNTER — Ambulatory Visit: Payer: Medicare HMO | Admitting: Family Medicine

## 2024-06-19 DIAGNOSIS — R531 Weakness: Secondary | ICD-10-CM | POA: Diagnosis not present

## 2024-06-19 LAB — CBC
HCT: 30.1 % — ABNORMAL LOW (ref 36.0–46.0)
Hemoglobin: 10.3 g/dL — ABNORMAL LOW (ref 12.0–15.0)
MCH: 31.9 pg (ref 26.0–34.0)
MCHC: 34.2 g/dL (ref 30.0–36.0)
MCV: 93.2 fL (ref 80.0–100.0)
Platelets: 100 K/uL — ABNORMAL LOW (ref 150–400)
RBC: 3.23 MIL/uL — ABNORMAL LOW (ref 3.87–5.11)
RDW: 13.6 % (ref 11.5–15.5)
WBC: 11.1 K/uL — ABNORMAL HIGH (ref 4.0–10.5)
nRBC: 0 % (ref 0.0–0.2)

## 2024-06-19 LAB — BASIC METABOLIC PANEL WITH GFR
Anion gap: 13 (ref 5–15)
BUN: 52 mg/dL — ABNORMAL HIGH (ref 8–23)
CO2: 23 mmol/L (ref 22–32)
Calcium: 9.5 mg/dL (ref 8.9–10.3)
Chloride: 95 mmol/L — ABNORMAL LOW (ref 98–111)
Creatinine, Ser: 2.47 mg/dL — ABNORMAL HIGH (ref 0.44–1.00)
GFR, Estimated: 18 mL/min — ABNORMAL LOW (ref >60)
Glucose, Bld: 261 mg/dL — ABNORMAL HIGH (ref 70–99)
Potassium: 3.7 mmol/L (ref 3.5–5.1)
Sodium: 131 mmol/L — ABNORMAL LOW (ref 135–145)

## 2024-06-19 LAB — CULTURE, BLOOD (ROUTINE X 2): Special Requests: ADEQUATE

## 2024-06-19 LAB — MAGNESIUM: Magnesium: 2.2 mg/dL (ref 1.7–2.4)

## 2024-06-19 LAB — GLUCOSE, CAPILLARY
Glucose-Capillary: 278 mg/dL — ABNORMAL HIGH (ref 70–99)
Glucose-Capillary: 259 mg/dL — ABNORMAL HIGH (ref 70–99)
Glucose-Capillary: 276 mg/dL — ABNORMAL HIGH (ref 70–99)
Glucose-Capillary: 234 mg/dL — ABNORMAL HIGH (ref 70–99)

## 2024-06-19 MED ORDER — ATORVASTATIN CALCIUM 80 MG PO TABS
80.0000 mg | ORAL_TABLET | Freq: Every day | ORAL | Status: DC
  Administered 2024-06-19 – 2024-06-22 (×4): 80 mg via ORAL
  Filled 2024-06-19 (×4): qty 1

## 2024-06-19 MED ORDER — CEFADROXIL 500 MG PO CAPS
500.0000 mg | ORAL_CAPSULE | Freq: Every day | ORAL | Status: DC
  Administered 2024-06-20 – 2024-06-22 (×3): 500 mg via ORAL
  Filled 2024-06-19 (×4): qty 1

## 2024-06-19 MED ORDER — OXYCODONE HCL 5 MG PO TABS
5.0000 mg | ORAL_TABLET | Freq: Four times a day (QID) | ORAL | Status: DC | PRN
  Administered 2024-06-19 – 2024-06-22 (×10): 5 mg via ORAL
  Filled 2024-06-19 (×10): qty 1

## 2024-06-19 MED ORDER — INSULIN ASPART 100 UNIT/ML IJ SOLN
0.0000 [IU] | Freq: Three times a day (TID) | INTRAMUSCULAR | Status: DC
  Administered 2024-06-19 (×2): 5 [IU] via SUBCUTANEOUS
  Administered 2024-06-20: 2 [IU] via SUBCUTANEOUS
  Administered 2024-06-20 (×2): 3 [IU] via SUBCUTANEOUS
  Administered 2024-06-21: 1 [IU] via SUBCUTANEOUS
  Administered 2024-06-21: 3 [IU] via SUBCUTANEOUS
  Administered 2024-06-21: 1 [IU] via SUBCUTANEOUS
  Administered 2024-06-22: 2 [IU] via SUBCUTANEOUS

## 2024-06-19 MED ORDER — FUROSEMIDE 40 MG PO TABS
40.0000 mg | ORAL_TABLET | Freq: Every morning | ORAL | Status: DC
  Administered 2024-06-20 – 2024-06-22 (×3): 40 mg via ORAL
  Filled 2024-06-19 (×3): qty 1

## 2024-06-19 MED ORDER — INSULIN GLARGINE-YFGN 100 UNIT/ML ~~LOC~~ SOLN
5.0000 [IU] | SUBCUTANEOUS | Status: DC
  Administered 2024-06-19: 5 [IU] via SUBCUTANEOUS
  Filled 2024-06-19 (×2): qty 0.05

## 2024-06-19 MED ORDER — SODIUM CHLORIDE 0.9 % IV SOLN
2.0000 g | INTRAVENOUS | Status: AC
  Administered 2024-06-20: 2 g via INTRAVENOUS
  Filled 2024-06-19: qty 20

## 2024-06-19 MED ORDER — SENNOSIDES-DOCUSATE SODIUM 8.6-50 MG PO TABS
1.0000 | ORAL_TABLET | Freq: Two times a day (BID) | ORAL | Status: DC
  Administered 2024-06-19 – 2024-06-22 (×6): 1 via ORAL
  Filled 2024-06-19 (×7): qty 1

## 2024-06-19 MED ORDER — METHOCARBAMOL 500 MG PO TABS
500.0000 mg | ORAL_TABLET | Freq: Four times a day (QID) | ORAL | Status: DC | PRN
  Administered 2024-06-19 – 2024-06-21 (×5): 500 mg via ORAL
  Filled 2024-06-19 (×5): qty 1

## 2024-06-19 MED ORDER — PANTOPRAZOLE SODIUM 40 MG PO TBEC
40.0000 mg | DELAYED_RELEASE_TABLET | Freq: Every day | ORAL | Status: DC
  Administered 2024-06-19 – 2024-06-22 (×4): 40 mg via ORAL
  Filled 2024-06-19 (×4): qty 1

## 2024-06-19 MED ORDER — ALUM & MAG HYDROXIDE-SIMETH 200-200-20 MG/5ML PO SUSP: 15.0000 mL | Freq: Four times a day (QID) | ORAL | Status: DC | PRN

## 2024-06-19 MED ORDER — INSULIN ASPART 100 UNIT/ML IJ SOLN
2.0000 [IU] | Freq: Three times a day (TID) | INTRAMUSCULAR | Status: DC
  Administered 2024-06-19 – 2024-06-20 (×3): 2 [IU] via SUBCUTANEOUS

## 2024-06-19 NOTE — Progress Notes (Signed)
 PROGRESS NOTE  Bonnie Chavez  FMW:[id number] DOB: 12/22/1933 DOA: 06/16/2024 PCP: Mercer Clotilda SAUNDERS, MD   Brief Narrative: Patient is a 89 year old female with history of chronic diastolic CHF, CKD stage IIIb with baseline creatinine of 1.4-1.7, type 2 diabetes, hypertension, hyperlipidemia hypothyroidism, severe aortic stenosis status post TAVR in November 2024 who presented to the emergency department with complaint of generalized weakness, dysuria for 1 to 2 days, subjective fever.  On presentation ,she was febrile with a stable blood pressure.  Lab work showed sodium of 131, potassium of 3.2, creatinine of 1.3, WBC count of 16, lactic acid of 2.7.  UA suspicious for UTI.  Blood culture, urine culture sent, patient started on ceftriaxone , IV fluid.   Blood cultures /urine culture showed E. coli.  PT is recommending SNF on discharge  Assessment & Plan:  Principal Problem:   Generalized weakness Active Problems:   DM2 (diabetes mellitus, type 2) (HCC)   Essential hypertension   HLD (hyperlipidemia)   Chronic diastolic CHF (congestive heart failure) (HCC)   CKD stage 3b, GFR 30-44 ml/min (HCC)   Severe sepsis (HCC)   Acute cystitis   Acquired hypothyroidism  Generalized weakness: Secondary to bacteremia/UTI.  PT/OT consulted.  TSH, Vitamin B12 normal  Severe sepsis secondary to E. coli bacteremia/acute cystitis: Presented with fever, weakness, dysuria, leukocytosis, lactic acidosis.  Given gentle IV fluids.  Blood culture/urine culture showed  E. coli.  Currently on ceftriaxone .  lactic acidosis has resolved.  Leukocytosis improved.  Antibiotics will be changed to oral.  CKD stage IIIb: Baseline creatinine around 1.4-1.7.  Follows with [facility]..  She had crackles on bilateral bases and was given Lasix  on 7/27.    BNP was elevated Given  her 2 dose of Lasix .  Creatinine trending up to the range of 2.  Will hold Lasix .  Appears more euvolemic today.  Restarted home Lasix  from  tomorrow  Acute hypoxic respiratory failure: She was  requiring 1 to 2 L of oxygen per minute.CXR  on 7/27 showed cardiomegaly with increased central interstitial markings throughout both lungs, likely pulmonary edema.  No crackles auscultated today.  Will attempt to monitor her on room air  Hypokalemia: Supplemented and corrected  Chronic HFpEF: Last echo had shown EF of 55%, grade 1 diastolic dysfunction, normal right ventricular function. Takes Lasix  at home.  Elevated BNP.  Given iv lasix  on 7/27.  Continue oral Lasix  from tomorrow  Diabetes type 2: Takes insulin  at home.  Monitor blood sugars.  Blood sugars running high continue current insulin  regimen.Last Hb A1c of 7.6  Hyperlipidemia: On statin at home  Hypertension: Currently blood pressure stable.  Home beta-blocker, amlodipine  on hold.  Hypothyroidism: Continue Synthyroid  Left flank pain: Unclear etiology.  CT renal study, lumbar spine x-ray did not show any acute findings.  Ordered oxycodone  and muscle relaxant  Debility/deconditioning: Patient seen by PT and OT and recommended SNF on discharge         DVT prophylaxis:enoxaparin  (LOVENOX ) injection 30 mg Start: 06/18/24 1445 SCDs Start: 06/16/24 2350     Code Status: Full Code  Family Communication: Called son [name] and discussed about management plan on  7/28  Patient status:Inpatient  Patient is from :Home  Anticipated discharge to: SNF  Estimated DC date:1-2 days   Consultants: None  Procedures:None  Antimicrobials:  Anti-infectives (From admission, onward)    Start     Dose/Rate Route Frequency Ordered Stop   06/18/24 1000  cefTRIAXone  (ROCEPHIN ) 2 g in sodium chloride  0.9 %  100 mL IVPB        2 g 200 mL/hr over 30 Minutes Intravenous Every 24 hours 06/17/24 1032     06/17/24 1130  cefTRIAXone  (ROCEPHIN ) 1 g in sodium chloride  0.9 % 100 mL IVPB        1 g 200 mL/hr over 30 Minutes Intravenous  Once 06/17/24 1034 06/17/24 1314   06/17/24 0000   cefTRIAXone  (ROCEPHIN ) 1 g in sodium chloride  0.9 % 100 mL IVPB  Status:  Discontinued        1 g 200 mL/hr over 30 Minutes Intravenous Every 24 hours 06/16/24 2351 06/17/24 1032   06/16/24 1900  ceFEPIme  (MAXIPIME ) 2 g in sodium chloride  0.9 % 100 mL IVPB        2 g 200 mL/hr over 30 Minutes Intravenous  Once 06/16/24 1858 06/16/24 1958   06/16/24 1900  metroNIDAZOLE  (FLAGYL ) IVPB 500 mg        500 mg 100 mL/hr over 60 Minutes Intravenous  Once 06/16/24 1858 06/16/24 2203   06/16/24 1900  vancomycin  (VANCOCIN ) IVPB 1000 mg/200 mL premix        1,000 mg 200 mL/hr over 60 Minutes Intravenous  Once 06/16/24 1858 06/16/24 2225       Subjective: Patient seen and examined at bedside today.  Hemodynamically stable.  On 1 L of oxygen this morning.  Denied any shortness of breath or cough this morning.  No peripheral edema.  Complains of pain on the left lower quadrant radiating to the back.  Abdomen is soft and nondistended and mostly nontender.  Objective: Vitals:   06/18/24 2002 06/19/24 0041 06/19/24 0353 06/19/24 0749  BP: (!) 133/49 (!) 135/51 (!) 138/53 (!) 130/58  Pulse: 63 (!) 57 61 (!) 59  Resp: 14 18 18 16   Temp: 99 F (37.2 C) 98.2 F (36.8 C) 98 F (36.7 C) 97.9 F (36.6 C)  TempSrc:      SpO2: 99% 99% 99% 100%  Weight:      Height:        Intake/Output Summary (Last 24 hours) at 06/19/2024 1042 Last data filed at 06/18/2024 1531 Gross per 24 hour  Intake 100 ml  Output --  Net 100 ml   Filed Weights   06/16/24 1309  Weight: 84.8 kg    Examination:    General exam: Overall comfortable, not in distress, pleasant elderly female HEENT: PERRL Respiratory system:  no wheezes or crackles, diminished air sounds in bases Cardiovascular system: S1 & S2 heard, RRR.  Systolic murmur Gastrointestinal system: Abdomen is nondistended, soft and nontender. Central nervous system: Alert and oriented Extremities: No edema, no clubbing ,no cyanosis Skin: No rashes, no  ulcers,no icterus      Data Reviewed: I have personally reviewed following labs and imaging studies  CBC: Recent Labs  Lab 06/16/24 1645 06/17/24 0221 06/18/24 0307 06/19/24 0420  WBC 16.3* 18.6* 13.2* 11.1*  NEUTROABS 14.1* 16.5*  --   --   HGB 11.3* 12.0 10.7* 10.3*  HCT 33.7* 35.8* 32.4* 30.1*  MCV 95.5 96.2 97.0 93.2  PLT 124* 114* 92* 100*   Basic Metabolic Panel: Recent Labs  Lab 06/16/24 1645 06/17/24 0221 06/18/24 1012 06/19/24 0420  NA 131* 133* 132* 131*  K 3.4* 4.2 3.8 3.7  CL 98 99 100 95*  CO2 22 20* 23 23  GLUCOSE 280* 244* 235* 261*  BUN 36* 33* 47* 52*  CREATININE 1.75* 1.59* 2.60* 2.47*  CALCIUM  9.8 9.7 9.1 9.5  MG  --  1.6*  --  2.2     Recent Results (from the past 240 hours)  Resp panel by RT-PCR (RSV, Flu A&B, Covid) Anterior Nasal Swab     Status: None   Collection Time: 06/16/24  4:36 PM   Specimen: Anterior Nasal Swab  Result Value Ref Range Status   SARS Coronavirus 2 by RT PCR NEGATIVE NEGATIVE Final   Influenza A by PCR NEGATIVE NEGATIVE Final   Influenza B by PCR NEGATIVE NEGATIVE Final    Comment: (NOTE) The Xpert Xpress SARS-CoV-2/FLU/RSV plus assay is intended as an aid in the diagnosis of influenza from Nasopharyngeal swab specimens and should not be used as a sole basis for treatment. Nasal washings and aspirates are unacceptable for Xpert Xpress SARS-CoV-2/FLU/RSV testing.  Fact Sheet for Patients: BloggerCourse.com  Fact Sheet for Healthcare Providers: SeriousBroker.it  This test is not yet approved or cleared by the [place]  FDA and has been authorized for detection and/or diagnosis of SARS-CoV-2 by FDA under an Emergency Use Authorization (EUA). This EUA will remain in effect (meaning this test can be used) for the duration of the COVID-19 declaration under Section 564(b)(1) of the Act, 21 U.S.C. section 360bbb-3(b)(1), unless the authorization is terminated  or revoked.     Resp Syncytial Virus by PCR NEGATIVE NEGATIVE Final    Comment: (NOTE) Fact Sheet for Patients: BloggerCourse.com  Fact Sheet for Healthcare Providers: SeriousBroker.it  This test is not yet approved or cleared by the [place]  FDA and has been authorized for detection and/or diagnosis of SARS-CoV-2 by FDA under an Emergency Use Authorization (EUA). This EUA will remain in effect (meaning this test can be used) for the duration of the COVID-19 declaration under Section 564(b)(1) of the Act, 21 U.S.C. section 360bbb-3(b)(1), unless the authorization is terminated or revoked.  Performed at [facility], [address] [place], [place] [address]   Urine Culture     Status: Abnormal   Collection Time: 06/16/24  4:36 PM   Specimen: Urine, Clean Catch  Result Value Ref Range Status   Specimen Description URINE, CLEAN CATCH  Final   Special Requests   Final    NONE Performed at [facility], [address] [place], [place] [address]    Culture 90,000 COLONIES/mL ESCHERICHIA COLI (A)  Final   Report Status 06/18/2024 FINAL  Final   Organism ID, Bacteria ESCHERICHIA COLI (A)  Final      Susceptibility   Escherichia coli - MIC*    AMPICILLIN 8 SENSITIVE Sensitive     CEFAZOLIN  <=4 SENSITIVE Sensitive     CEFEPIME  <=0.12 SENSITIVE Sensitive     CEFTRIAXONE  <=0.25 SENSITIVE Sensitive     CIPROFLOXACIN <=0.25 SENSITIVE Sensitive     GENTAMICIN <=1 SENSITIVE Sensitive     IMIPENEM <=0.25 SENSITIVE Sensitive     NITROFURANTOIN <=16 SENSITIVE Sensitive     TRIMETH/SULFA <=20 SENSITIVE Sensitive     AMPICILLIN/SULBACTAM 4 SENSITIVE Sensitive     PIP/TAZO <=4 SENSITIVE Sensitive ug/mL    * 90,000 COLONIES/mL ESCHERICHIA COLI  Blood culture (routine x 2)     Status: Abnormal   Collection Time: 06/16/24  4:45 PM   Specimen: BLOOD  Result Value Ref Range Status   Specimen Description BLOOD LEFT  ANTECUBITAL  Final   Special Requests   Final    BOTTLES DRAWN AEROBIC AND ANAEROBIC Blood Culture results may not be optimal due to an inadequate volume of blood received in culture bottles   Culture  Setup Time   Final    GRAM NEGATIVE RODS IN BOTH AEROBIC AND ANAEROBIC BOTTLES CRITICAL VALUE NOTED.  VALUE IS CONSISTENT WITH PREVIOUSLY REPORTED AND CALLED VALUE.    Culture (A)  Final    ESCHERICHIA COLI SUSCEPTIBILITIES PERFORMED ON PREVIOUS CULTURE WITHIN THE LAST 5 DAYS. Performed at [facility], [address], [place], [place] [address]    Report Status 06/19/2024 FINAL  Final  Blood culture (routine x 2)     Status: Abnormal   Collection Time: 06/16/24  6:00 PM   Specimen: BLOOD  Result Value Ref Range Status   Specimen Description BLOOD SITE NOT SPECIFIED  Final   Special Requests   Final    BOTTLES DRAWN AEROBIC AND ANAEROBIC Blood Culture adequate volume   Culture  Setup Time   Final    GRAM NEGATIVE RODS IN BOTH AEROBIC AND ANAEROBIC BOTTLES CRITICAL RESULT CALLED TO, READ BACK BY AND VERIFIED WITH: [facility] JADE DENNINGER [id number] [id number] BY EC Performed at [facility], [address] [place], [place] [address]    Culture ESCHERICHIA COLI (A)  Final   Report Status 06/19/2024 FINAL  Final   Organism ID, Bacteria ESCHERICHIA COLI  Final   Organism ID, Bacteria ESCHERICHIA COLI  Final      Susceptibility   Escherichia coli - KIRBY BAUER*    CEFAZOLIN  SENSITIVE Sensitive    Escherichia coli - MIC*    AMPICILLIN 8 SENSITIVE Sensitive     CEFEPIME  <=0.12 SENSITIVE Sensitive     CEFTAZIDIME <=1 SENSITIVE Sensitive     CEFTRIAXONE  <=0.25 SENSITIVE Sensitive     CIPROFLOXACIN <=0.25 SENSITIVE Sensitive     GENTAMICIN <=1 SENSITIVE Sensitive     IMIPENEM 0.5 SENSITIVE Sensitive     TRIMETH/SULFA <=20 SENSITIVE Sensitive     AMPICILLIN/SULBACTAM 4 SENSITIVE Sensitive     PIP/TAZO 32 INTERMEDIATE Intermediate ug/mL    * ESCHERICHIA COLI    ESCHERICHIA COLI   Blood Culture ID Panel (Reflexed)     Status: Abnormal   Collection Time: 06/16/24  6:00 PM  Result Value Ref Range Status   Enterococcus faecalis NOT DETECTED NOT DETECTED Final   Enterococcus Faecium NOT DETECTED NOT DETECTED Final   Listeria monocytogenes NOT DETECTED NOT DETECTED Final   Staphylococcus species NOT DETECTED NOT DETECTED Final   Staphylococcus aureus (BCID) NOT DETECTED NOT DETECTED Final   Staphylococcus epidermidis NOT DETECTED NOT DETECTED Final   Staphylococcus lugdunensis NOT DETECTED NOT DETECTED Final   Streptococcus species NOT DETECTED NOT DETECTED Final   Streptococcus agalactiae NOT DETECTED NOT DETECTED Final   Streptococcus pneumoniae NOT DETECTED NOT DETECTED Final   Streptococcus pyogenes NOT DETECTED NOT DETECTED Final   A.calcoaceticus-baumannii NOT DETECTED NOT DETECTED Final   Bacteroides fragilis NOT DETECTED NOT DETECTED Final   Enterobacterales DETECTED (A) NOT DETECTED Final    Comment: Enterobacterales represent a large order of gram negative bacteria, not a single organism. CRITICAL RESULT CALLED TO, READ BACK BY AND VERIFIED WITH: PHAR[facility] JADE DENNINGER [id number] AT 1025 BY EC    Enterobacter cloacae complex NOT DETECTED NOT DETECTED Final   Escherichia coli DETECTED (A) NOT DETECTED Final    Comment: CRITICAL RESULT CALLED TO, READ BACK BY AND VERIFIED WITH: PHARMD JADE DENNINGER [id number] AT 1025 BY EC    Klebsiella aerogenes NOT DETECTED NOT DETECTED Final   Klebsiella oxytoca NOT DETECTED NOT DETECTED Final   Klebsiella pneumoniae NOT DETECTED NOT DETECTED Final   Proteus species NOT DETECTED NOT  DETECTED Final   Salmonella species NOT DETECTED NOT DETECTED Final   Serratia marcescens NOT DETECTED NOT DETECTED Final   Haemophilus influenzae NOT DETECTED NOT DETECTED Final   Neisseria meningitidis NOT DETECTED NOT DETECTED Final   Pseudomonas aeruginosa NOT DETECTED NOT DETECTED Final   Stenotrophomonas maltophilia NOT DETECTED NOT  DETECTED Final   Candida albicans NOT DETECTED NOT DETECTED Final   Candida auris NOT DETECTED NOT DETECTED Final   Candida glabrata NOT DETECTED NOT DETECTED Final   Candida krusei NOT DETECTED NOT DETECTED Final   Candida parapsilosis NOT DETECTED NOT DETECTED Final   Candida tropicalis NOT DETECTED NOT DETECTED Final   Cryptococcus neoformans/gattii NOT DETECTED NOT DETECTED Final   CTX-M ESBL NOT DETECTED NOT DETECTED Final   Carbapenem resistance IMP NOT DETECTED NOT DETECTED Final   Carbapenem resistance KPC NOT DETECTED NOT DETECTED Final   Carbapenem resistance NDM NOT DETECTED NOT DETECTED Final   Carbapenem resist OXA 48 LIKE NOT DETECTED NOT DETECTED Final   Carbapenem resistance VIM NOT DETECTED NOT DETECTED Final    Comment: Performed at [facility], [address], [place], [place] [address]     Radiology Studies: DG CHEST PORT 1 VIEW Result Date: 06/18/2024 CLINICAL DATA:  Shortness of breath EXAM: PORTABLE CHEST 1 VIEW COMPARISON:  Chest x-ray 06/16/2024 FINDINGS: The heart is enlarged. Patient is status post TAVR. There are increased central interstitial markings throughout both lungs. There is no lung consolidation, pleural effusion or pneumothorax. Bilateral shoulder arthroplasties are present. There are atherosclerotic calcifications of the aorta. IMPRESSION: Cardiomegaly with increased central interstitial markings throughout both lungs, likely pulmonary edema. Electronically Signed   By: Greig Pique M.D.   On: 06/18/2024 15:57    Scheduled Meds:  aspirin  EC  81 mg Oral QHS   enoxaparin  (LOVENOX ) injection  30 mg Subcutaneous Q24H   insulin  aspart  0-15 Units Subcutaneous TID WC   levothyroxine   50 mcg Oral QAC breakfast   pantoprazole   40 mg Oral Daily   polyethylene glycol  17 g Oral Daily   rosuvastatin   40 mg Oral Daily   senna-docusate  1 tablet Oral BID   Continuous Infusions:  cefTRIAXone  (ROCEPHIN )  IV Stopped (06/18/24 1059)     LOS: 3 days    Ivonne Mustache, MD [facility] P7/28/2025, 10:42 AM

## 2024-06-19 NOTE — TOC Progression Note (Addendum)
 Transition of Care [facility]) - Progression Note    Patient Details  Name: Bonnie Chavez MRN: [id number] Date of Birth: [birth year]  [facility]) CM/SW Contact  Midori Dado A [place], LCSW Phone Number: 06/19/2024, 11:41 AM  Clinical Narrative:     Update 1217 CSW provided bed offers via secure email to pt's son [name]. Waiting to hear back on decision regarding facility choice.    CSW met with pt  at bedside to provide bed offers with Medicare.gov ratings. She stated they would discuss and make a decision on a facility and let CSW know.  Requested CSW reach out to pt's son [name]. CSW contacted him, left VM with contact info to reach back out to CSW.   Inpatient case management will continue to follow.    Expected Discharge Plan: Skilled Nursing Facility Barriers to Discharge: Continued Medical Work up               Expected Discharge Plan and Services In-house Referral: Clinical Social Work     Living arrangements for the past 2 months: Single Family Home                                       Social Drivers of Health (SDOH) Interventions SDOH Screenings   Food Insecurity: No Food Insecurity (06/17/2024)  Housing: Low Risk  (06/17/2024)  Transportation Needs: No Transportation Needs (06/17/2024)  Utilities: Not At Risk (06/17/2024)  Alcohol Screen: Low Risk  (11/06/2022)  Depression (PHQ2-9): Low Risk  (04/03/2024)  Financial Resource Strain: Low Risk  (11/06/2022)  Physical Activity: Insufficiently Active (11/06/2022)  Social Connections: Moderately Integrated (06/17/2024)  Stress: No Stress Concern Present (11/06/2022)  Tobacco Use: Low Risk  (06/17/2024)    Readmission Risk Interventions     No data to display

## 2024-06-19 NOTE — Progress Notes (Signed)
 Physical Therapy Treatment Patient Details Name: Bonnie Chavez MRN: [id number] DOB: 02/29/1934 Today's Date: 06/19/2024   History of Present Illness Pt is an 89 yo female presenting to [facility] ED on 06/16/24 with back pain and generalized weakness, further workup for sepsis. Imaging demonstrated no acute spinal abnormalities but does display moderate degenerative changes. PMH of chronic diastolic heart failure, CKD 3B, type 2 diabetes mellitus, essential pretension, hyperlipidemia, acquired hypothyroidism, severe aortic stenosis status post TAVR in November 2024.    PT Comments  Pt with report of improved back pain. Pt eager to amb however had an episode of urinary incontinence upon standing requiring maxA for hygiene. Pt with onset of fatigue and back pain with standing and activity but was still able to amb with RW from one side of the bed to the other. Inpatient rehab program < 3 hrs a day remains appropriate to progress towards indep function. Acute PT to cont to follow.    If plan is discharge home, recommend the following: A lot of help with walking and/or transfers;A lot of help with bathing/dressing/bathroom;Assistance with cooking/housework;Direct supervision/assist for medications management;Direct supervision/assist for financial management;Assist for transportation;Help with stairs or ramp for entrance;Supervision due to cognitive status   Can travel by private vehicle     No  Equipment Recommendations  None recommended by PT    Recommendations for Other Services       Precautions / Restrictions Precautions Precautions: Fall Precaution/Restrictions Comments: back precautions for comfort. Restrictions Weight Bearing Restrictions Per Provider Order: No     Mobility  Bed Mobility Overal bed mobility: Needs Assistance Bed Mobility: Rolling, Sidelying to Sit Rolling: Min assist Sidelying to sit: Min assist       General bed mobility comments: use of bed rail to roll with HOB  elevated, minA for trunk elevation and to scoot to EOB with increased time    Transfers Overall transfer level: Needs assistance Equipment used: Rolling walker (2 wheels) Transfers: Sit to/from Stand Sit to Stand: From elevated surface, Min assist           General transfer comment: completed 3 sit to stands from EOB    Ambulation/Gait Ambulation/Gait assistance: Min assist Gait Distance (Feet): 12 Feet Assistive device: Rolling walker (2 wheels) Gait Pattern/deviations: Step-through pattern, Shuffle, Trunk flexed Gait velocity: slowed Gait velocity interpretation: <1.31 ft/sec, indicative of household ambulator   General Gait Details: minAx1 for walker management, decreased step height and length   Stairs             Wheelchair Mobility     Tilt Bed    Modified Rankin (Stroke Patients Only)       Balance Overall balance assessment: Needs assistance Sitting-balance support: Feet supported Sitting balance-Leahy Scale: Fair     Standing balance support: Reliant on assistive device for balance, Bilateral upper extremity supported Standing balance-Leahy Scale: Poor Standing balance comment: RW reliant, did attempt to perform pericare in standing but noted increase in back pain                            Communication Communication Communication: No apparent difficulties  Cognition Arousal: Alert Behavior During Therapy: WFL for tasks assessed/performed                             Following commands: Intact      Cueing Cueing Techniques: Verbal cues  Exercises  General Comments General comments (skin integrity, edema, etc.): 2lO2 via [place], pt incontinent of urine, assist to change underwear and perform hygiene      Pertinent Vitals/Pain Pain Assessment Pain Assessment: Faces Faces Pain Scale: Hurts little more Pain Location: back to abdomen L side Pain Descriptors / Indicators: Aching, Discomfort Pain Intervention(s):  Monitored during session    Home Living                          Prior Function            PT Goals (current goals can now be found in the care plan section) Acute Rehab PT Goals PT Goal Formulation: With patient Time For Goal Achievement: 07/01/24 Potential to Achieve Goals: Fair Progress towards PT goals: Progressing toward goals    Frequency    Min 2X/week      PT Plan      Co-evaluation              AM-PAC PT 6 Clicks Mobility   Outcome Measure  Help needed turning from your back to your side while in a flat bed without using bedrails?: A Little Help needed moving from lying on your back to sitting on the side of a flat bed without using bedrails?: A Little Help needed moving to and from a bed to a chair (including a wheelchair)?: A Little Help needed standing up from a chair using your arms (e.g., wheelchair or bedside chair)?: A Lot Help needed to walk in hospital room?: A Lot Help needed climbing 3-5 steps with a railing? : A Lot 6 Click Score: 15    End of Session Equipment Utilized During Treatment: Gait belt Activity Tolerance: Patient limited by pain;Patient limited by fatigue Patient left: in chair;with call bell/phone within reach;with chair alarm set Nurse Communication: Mobility status PT Visit Diagnosis: Unsteadiness on feet (R26.81);Other abnormalities of gait and mobility (R26.89);Muscle weakness (generalized) (M62.81);Difficulty in walking, not elsewhere classified (R26.2)     Time: 8663-8586 PT Time Calculation (min) (ACUTE ONLY): 37 min  Charges:    $Gait Training: 8-22 mins $Therapeutic Activity: 8-22 mins PT General Charges $$ ACUTE PT VISIT: 1 Visit                     Norene Ames, PT, DPT Acute Rehabilitation Services Secure chat preferred Office #: [phone]    [name] 06/19/2024, 3:06 PM

## 2024-06-19 NOTE — Inpatient Diabetes Management (Signed)
 Inpatient Diabetes Program Recommendations  AACE/ADA: New Consensus Statement on Inpatient Glycemic Control (2015)  Target Ranges:  Prepandial:   less than 140 mg/dL      Peak postprandial:   less than 180 mg/dL (1-2 hours)      Critically ill patients:  140 - 180 mg/dL   Lab Results  Component Value Date   GLUCAP 278 (H) 06/19/2024   HGBA1C 7.8 (H) 06/18/2024    Review of Glycemic Control  Latest Reference Range & Units 06/18/24 16:57 06/18/24 21:44 06/19/24 07:51  Glucose-Capillary 70 - 99 mg/dL 722 (H) 784 (H) 721 (H)   Diabetes history: DM 2 Outpatient Diabetes medications:  Humalog 70/30 6 units q AM and 8 units q PM Current orders for Inpatient glycemic control:  Novolog  0-15 units tid with meals  Semglee  5 units daily  Inpatient Diabetes Program Recommendations:    Consider reducing Novolog  correction to sensitive (0-9 units) tid with meals and add Novolog  2 units tid with meals (hold if patient eats less than 50% or NPO).   Thanks,  Randall Bullocks, RN, BC-ADM Inpatient Diabetes Coordinator Pager [phone]  (8a-5p)

## 2024-06-19 NOTE — Plan of Care (Signed)
   Problem: Education: Goal: Knowledge of General Education information will improve Description Including pain rating scale, medication(s)/side effects and non-pharmacologic comfort measures Outcome: Progressing

## 2024-06-19 NOTE — Care Management Important Message (Signed)
 Important Message  Patient Details  Name: Bonnie Chavez MRN: [id number] Date of Birth: 1934/09/10   Important Message Given:        Glade Cuff 06/19/2024, 3:18 PM

## 2024-06-20 DIAGNOSIS — R531 Weakness: Secondary | ICD-10-CM | POA: Diagnosis not present

## 2024-06-20 LAB — BASIC METABOLIC PANEL WITH GFR
Anion gap: 13 (ref 5–15)
BUN: 51 mg/dL — ABNORMAL HIGH (ref 8–23)
CO2: 24 mmol/L (ref 22–32)
Calcium: 9.2 mg/dL (ref 8.9–10.3)
Chloride: 95 mmol/L — ABNORMAL LOW (ref 98–111)
Creatinine, Ser: 2.11 mg/dL — ABNORMAL HIGH (ref 0.44–1.00)
GFR, Estimated: 22 mL/min — ABNORMAL LOW (ref >60)
Glucose, Bld: 226 mg/dL — ABNORMAL HIGH (ref 70–99)
Potassium: 3.6 mmol/L (ref 3.5–5.1)
Sodium: 132 mmol/L — ABNORMAL LOW (ref 135–145)

## 2024-06-20 LAB — GLUCOSE, CAPILLARY
Glucose-Capillary: 235 mg/dL — ABNORMAL HIGH (ref 70–99)
Glucose-Capillary: 214 mg/dL — ABNORMAL HIGH (ref 70–99)
Glucose-Capillary: 170 mg/dL — ABNORMAL HIGH (ref 70–99)
Glucose-Capillary: 144 mg/dL — ABNORMAL HIGH (ref 70–99)

## 2024-06-20 MED ORDER — INSULIN ASPART 100 UNIT/ML IJ SOLN
3.0000 [IU] | Freq: Three times a day (TID) | INTRAMUSCULAR | Status: DC
  Administered 2024-06-20 – 2024-06-22 (×6): 3 [IU] via SUBCUTANEOUS

## 2024-06-20 MED ORDER — GLUCERNA SHAKE PO LIQD
237.0000 mL | Freq: Three times a day (TID) | ORAL | Status: DC
  Administered 2024-06-20 – 2024-06-21 (×5): 237 mL via ORAL

## 2024-06-20 MED ORDER — INSULIN GLARGINE-YFGN 100 UNIT/ML ~~LOC~~ SOLN
10.0000 [IU] | SUBCUTANEOUS | Status: DC
  Administered 2024-06-20 – 2024-06-22 (×3): 10 [IU] via SUBCUTANEOUS
  Filled 2024-06-20 (×3): qty 0.1

## 2024-06-20 NOTE — Plan of Care (Signed)

## 2024-06-20 NOTE — Progress Notes (Signed)
 PROGRESS NOTE  Bonnie Chavez  FMW:[id number] DOB: [birth year] DOA: 06/16/2024 PCP: Mercer Clotilda SAUNDERS, MD   Brief Narrative: Patient is a 89 year old female with history of chronic diastolic CHF, CKD stage IIIb with baseline creatinine of 1.4-1.7, type 2 diabetes, hypertension, hyperlipidemia hypothyroidism, severe aortic stenosis status post TAVR in November 2024 who presented to the emergency department with complaint of generalized weakness, dysuria for 1 to 2 days, subjective fever.  On presentation ,she was febrile with a stable blood pressure.  Lab work showed sodium of 131, potassium of 3.2, creatinine of 1.3, WBC count of 16, lactic acid of 2.7.  UA suspicious for UTI.  Blood culture, urine culture sent,started on ceftriaxone , IV fluid.   Blood cultures /urine culture showed E. coli.  Abx changed to oral.PT is recommending SNF on discharge.  Waiting for bed.  Medically stable for discharge whenever possible  Assessment & Plan:  Principal Problem:   Generalized weakness Active Problems:   DM2 (diabetes mellitus, type 2) (HCC)   Essential hypertension   HLD (hyperlipidemia)   Chronic diastolic CHF (congestive heart failure) (HCC)   CKD stage 3b, GFR 30-44 ml/min (HCC)   Severe sepsis (HCC)   Acute cystitis   Acquired hypothyroidism  Generalized weakness: Secondary to bacteremia/UTI.  PT/OT consulted.  TSH, Vitamin B12 normal  Severe sepsis secondary to E. coli bacteremia/acute cystitis: Presented with fever, weakness, dysuria, leukocytosis, lactic acidosis.  Given gentle IV fluids.  Blood culture/urine culture showed  E. coli.  .  lactic acidosis has resolved.  Leukocytosis improved.  Antibiotics changed to oral.  CKD stage IIIb: Baseline creatinine around 1.4-1.7.  Follows with [facility]..  She had crackles on bilateral bases and was given Lasix  on 7/27.    BNP was elevated Given  her 2 dose of Lasix .  Creatinine trended up to the range of 2.   Appears more euvolemic today.   Restarted home Lasix    Acute hypoxic respiratory failure: She was  requiring 1 to 2 L of oxygen per minute earlier .CXR  on 7/27 showed cardiomegaly with increased central interstitial markings throughout both lungs, likely pulmonary edema.  No crackles auscultated today.  Now weaned to room air  Hypokalemia: Supplemented and corrected  Chronic HFpEF: Last echo had shown EF of 55%, grade 1 diastolic dysfunction, normal right ventricular function. Takes Lasix  at home.  Elevated BNP.  Given iv lasix  on 7/27.  Continue oral Lasix    Diabetes type 2: Takes insulin  at home.  Monitor blood sugars.  Blood sugars running high, continue current insulin  regimen.Last Hb A1c of 7.6  Hyperlipidemia: On statin at home  Hypertension: Currently blood pressure soft.  Home beta-blocker, amlodipine  on hold.  Hypothyroidism: Continue Synthyroid  Left flank pain: Unclear etiology.  CT renal study, lumbar spine x-ray did not show any acute findings.  Ordered oxycodone  and muscle relaxant.  Flank pain better..  Continue bowel regimen  Debility/deconditioning: Patient seen by PT and OT and recommended SNF on discharge         DVT prophylaxis:enoxaparin  (LOVENOX ) injection 30 mg Start: 06/18/24 1445 SCDs Start: 06/16/24 2350     Code Status: Full Code  Family Communication: Called son [name] and discussed about management plan on  7/28  Patient status:Inpatient  Patient is from :Home  Anticipated discharge to: SNF  Estimated DC date: whenever  possible   Consultants: None  Procedures:None  Antimicrobials:  Anti-infectives (From admission, onward)    Start     Dose/Rate Route Frequency Ordered Stop  06/20/24 1000  cefadroxil  (DURICEF) capsule 500 mg        500 mg Oral Daily 06/19/24 1051 06/27/24 0959   06/20/24 1000  cefTRIAXone  (ROCEPHIN ) 2 g in sodium chloride  0.9 % 100 mL IVPB        2 g 200 mL/hr over 30 Minutes Intravenous Every 24 hours 06/19/24 1051 06/20/24 0902   06/18/24 1000   cefTRIAXone  (ROCEPHIN ) 2 g in sodium chloride  0.9 % 100 mL IVPB  Status:  Discontinued        2 g 200 mL/hr over 30 Minutes Intravenous Every 24 hours 06/17/24 1032 06/19/24 1051   06/17/24 1130  cefTRIAXone  (ROCEPHIN ) 1 g in sodium chloride  0.9 % 100 mL IVPB        1 g 200 mL/hr over 30 Minutes Intravenous  Once 06/17/24 1034 06/17/24 1314   06/17/24 0000  cefTRIAXone  (ROCEPHIN ) 1 g in sodium chloride  0.9 % 100 mL IVPB  Status:  Discontinued        1 g 200 mL/hr over 30 Minutes Intravenous Every 24 hours 06/16/24 2351 06/17/24 1032   06/16/24 1900  ceFEPIme  (MAXIPIME ) 2 g in sodium chloride  0.9 % 100 mL IVPB        2 g 200 mL/hr over 30 Minutes Intravenous  Once 06/16/24 1858 06/16/24 1958   06/16/24 1900  metroNIDAZOLE  (FLAGYL ) IVPB 500 mg        500 mg 100 mL/hr over 60 Minutes Intravenous  Once 06/16/24 1858 06/16/24 2203   06/16/24 1900  vancomycin  (VANCOCIN ) IVPB 1000 mg/200 mL premix        1,000 mg 200 mL/hr over 60 Minutes Intravenous  Once 06/16/24 1858 06/16/24 2225       Subjective: Patient seen and examined at bedside today.  Hemodynamically stable.  Overall comfortable.  Sitting on the chair and eating her breakfast.  She has been weaned to room air today.  Denies any worsening shortness of breath or cough.  Appears euvolemic.  Left flank pain better today  Objective: Vitals:   06/20/24 0325 06/20/24 0500 06/20/24 0736 06/20/24 1104  BP: (!) 127/51  (!) 128/57 (!) 104/51  Pulse: 69  63 66  Resp: 17  17 17   Temp: 97.8 F (36.6 C)  99.1 F (37.3 C) 98.2 F (36.8 C)  TempSrc:      SpO2: 93%  94% 100%  Weight:  84.8 kg    Height:       No intake or output data in the 24 hours ending 06/20/24 1118  Filed Weights   06/16/24 1309 06/20/24 0500  Weight: 84.8 kg 84.8 kg    Examination:   General exam: Overall comfortable, not in distress, pleasant elderly female, obese HEENT: PERRL Respiratory system:  no wheezes or crackles, mildly diminished sounds on  bases Cardiovascular system: S1 & S2 heard, RRR.  Systolic murmur Gastrointestinal system: Abdomen is nondistended, soft and nontender. Central nervous system: Alert and oriented Extremities: No edema, no clubbing ,no cyanosis Skin: No rashes, no ulcers,no icterus        Data Reviewed: I have personally reviewed following labs and imaging studies  CBC: Recent Labs  Lab 06/16/24 1645 06/17/24 0221 06/18/24 0307 06/19/24 0420  WBC 16.3* 18.6* 13.2* 11.1*  NEUTROABS 14.1* 16.5*  --   --   HGB 11.3* 12.0 10.7* 10.3*  HCT 33.7* 35.8* 32.4* 30.1*  MCV 95.5 96.2 97.0 93.2  PLT 124* 114* 92* 100*   Basic Metabolic Panel: Recent Labs  Lab 06/16/24 1645  06/17/24 0221 06/18/24 1012 06/19/24 0420 06/20/24 0404  NA 131* 133* 132* 131* 132*  K 3.4* 4.2 3.8 3.7 3.6  CL 98 99 100 95* 95*  CO2 22 20* 23 23 24   GLUCOSE 280* 244* 235* 261* 226*  BUN 36* 33* 47* 52* 51*  CREATININE 1.75* 1.59* 2.60* 2.47* 2.11*  CALCIUM  9.8 9.7 9.1 9.5 9.2  MG  --  1.6*  --  2.2  --      Recent Results (from the past 240 hours)  Resp panel by RT-PCR (RSV, Flu A&B, Covid) Anterior Nasal Swab     Status: None   Collection Time: 06/16/24  4:36 PM   Specimen: Anterior Nasal Swab  Result Value Ref Range Status   SARS Coronavirus 2 by RT PCR NEGATIVE NEGATIVE Final   Influenza A by PCR NEGATIVE NEGATIVE Final   Influenza B by PCR NEGATIVE NEGATIVE Final    Comment: (NOTE) The Xpert Xpress SARS-CoV-2/FLU/RSV plus assay is intended as an aid in the diagnosis of influenza from Nasopharyngeal swab specimens and should not be used as a sole basis for treatment. Nasal washings and aspirates are unacceptable for Xpert Xpress SARS-CoV-2/FLU/RSV testing.  Fact Sheet for Patients: BloggerCourse.com  Fact Sheet for Healthcare Providers: SeriousBroker.it  This test is not yet approved or cleared by the [place]  FDA and has been authorized for detection  and/or diagnosis of SARS-CoV-2 by FDA under an Emergency Use Authorization (EUA). This EUA will remain in effect (meaning this test can be used) for the duration of the COVID-19 declaration under Section 564(b)(1) of the Act, 21 U.S.C. section 360bbb-3(b)(1), unless the authorization is terminated or revoked.     Resp Syncytial Virus by PCR NEGATIVE NEGATIVE Final    Comment: (NOTE) Fact Sheet for Patients: BloggerCourse.com  Fact Sheet for Healthcare Providers: SeriousBroker.it  This test is not yet approved or cleared by the [place]  FDA and has been authorized for detection and/or diagnosis of SARS-CoV-2 by FDA under an Emergency Use Authorization (EUA). This EUA will remain in effect (meaning this test can be used) for the duration of the COVID-19 declaration under Section 564(b)(1) of the Act, 21 U.S.C. section 360bbb-3(b)(1), unless the authorization is terminated or revoked.  Performed at [facility], [address], [place], [place] [address]   Urine Culture     Status: Abnormal   Collection Time: 06/16/24  4:36 PM   Specimen: Urine, Clean Catch  Result Value Ref Range Status   Specimen Description URINE, CLEAN CATCH  Final   Special Requests   Final    NONE Performed at [facility], [address], [place], [place] [address]    Culture 90,000 COLONIES/mL ESCHERICHIA COLI (A)  Final   Report Status 06/18/2024 FINAL  Final   Organism ID, Bacteria ESCHERICHIA COLI (A)  Final      Susceptibility   Escherichia coli - MIC*    AMPICILLIN 8 SENSITIVE Sensitive     CEFAZOLIN  <=4 SENSITIVE Sensitive     CEFEPIME  <=0.12 SENSITIVE Sensitive     CEFTRIAXONE  <=0.25 SENSITIVE Sensitive     CIPROFLOXACIN <=0.25 SENSITIVE Sensitive     GENTAMICIN <=1 SENSITIVE Sensitive     IMIPENEM <=0.25 SENSITIVE Sensitive     NITROFURANTOIN <=16 SENSITIVE Sensitive     TRIMETH/SULFA <=20 SENSITIVE Sensitive      AMPICILLIN/SULBACTAM 4 SENSITIVE Sensitive     PIP/TAZO <=4 SENSITIVE Sensitive ug/mL    * 90,000 COLONIES/mL ESCHERICHIA COLI  Blood culture (routine  x 2)     Status: Abnormal   Collection Time: 06/16/24  4:45 PM   Specimen: BLOOD  Result Value Ref Range Status   Specimen Description BLOOD LEFT ANTECUBITAL  Final   Special Requests   Final    BOTTLES DRAWN AEROBIC AND ANAEROBIC Blood Culture results may not be optimal due to an inadequate volume of blood received in culture bottles   Culture  Setup Time   Final    GRAM NEGATIVE RODS IN BOTH AEROBIC AND ANAEROBIC BOTTLES CRITICAL VALUE NOTED.  VALUE IS CONSISTENT WITH PREVIOUSLY REPORTED AND CALLED VALUE.    Culture (A)  Final    ESCHERICHIA COLI SUSCEPTIBILITIES PERFORMED ON PREVIOUS CULTURE WITHIN THE LAST 5 DAYS. Performed at [facility], [address], [place], [place] [address]    Report Status 06/19/2024 FINAL  Final  Blood culture (routine x 2)     Status: Abnormal   Collection Time: 06/16/24  6:00 PM   Specimen: BLOOD  Result Value Ref Range Status   Specimen Description BLOOD SITE NOT SPECIFIED  Final   Special Requests   Final    BOTTLES DRAWN AEROBIC AND ANAEROBIC Blood Culture adequate volume   Culture  Setup Time   Final    GRAM NEGATIVE RODS IN BOTH AEROBIC AND ANAEROBIC BOTTLES CRITICAL RESULT CALLED TO, READ BACK BY AND VERIFIED WITH: [facility] JADE DENNINGER [id number] BY EC Performed at [facility], [address], [place], [place] [address]    Culture ESCHERICHIA COLI (A)  Final   Report Status 06/19/2024 FINAL  Final   Organism ID, Bacteria ESCHERICHIA COLI  Final   Organism ID, Bacteria ESCHERICHIA COLI  Final      Susceptibility   Escherichia coli - KIRBY BAUER*    CEFAZOLIN  SENSITIVE Sensitive    Escherichia coli - MIC*    AMPICILLIN 8 SENSITIVE Sensitive     CEFEPIME  <=0.12 SENSITIVE Sensitive     CEFTAZIDIME <=1 SENSITIVE Sensitive     CEFTRIAXONE  <=0.25 SENSITIVE Sensitive      CIPROFLOXACIN <=0.25 SENSITIVE Sensitive     GENTAMICIN <=1 SENSITIVE Sensitive     IMIPENEM 0.5 SENSITIVE Sensitive     TRIMETH/SULFA <=20 SENSITIVE Sensitive     AMPICILLIN/SULBACTAM 4 SENSITIVE Sensitive     PIP/TAZO 32 INTERMEDIATE Intermediate ug/mL    * ESCHERICHIA COLI    ESCHERICHIA COLI  Blood Culture ID Panel (Reflexed)     Status: Abnormal   Collection Time: 06/16/24  6:00 PM  Result Value Ref Range Status   Enterococcus faecalis NOT DETECTED NOT DETECTED Final   Enterococcus Faecium NOT DETECTED NOT DETECTED Final   Listeria monocytogenes NOT DETECTED NOT DETECTED Final   Staphylococcus species NOT DETECTED NOT DETECTED Final   Staphylococcus aureus (BCID) NOT DETECTED NOT DETECTED Final   Staphylococcus epidermidis NOT DETECTED NOT DETECTED Final   Staphylococcus lugdunensis NOT DETECTED NOT DETECTED Final   Streptococcus species NOT DETECTED NOT DETECTED Final   Streptococcus agalactiae NOT DETECTED NOT DETECTED Final   Streptococcus pneumoniae NOT DETECTED NOT DETECTED Final   Streptococcus pyogenes NOT DETECTED NOT DETECTED Final   A.calcoaceticus-baumannii NOT DETECTED NOT DETECTED Final   Bacteroides fragilis NOT DETECTED NOT DETECTED Final   Enterobacterales DETECTED (A) NOT DETECTED Final    Comment: Enterobacterales represent a large order of gram negative bacteria, not a single organism. CRITICAL RESULT CALLED TO, READ BACK BY AND VERIFIED WITH: [facility]AR[facility] JADE DENNINGER [id number] AT 1025 BY EC    Enterobacter cloacae  complex NOT DETECTED NOT DETECTED Final   Escherichia coli DETECTED (A) NOT DETECTED Final    Comment: CRITICAL RESULT CALLED TO, READ BACK BY AND VERIFIED WITH: PHARMD JADE DENNINGER [id number] AT 1025 BY EC    Klebsiella aerogenes NOT DETECTED NOT DETECTED Final   Klebsiella oxytoca NOT DETECTED NOT DETECTED Final   Klebsiella pneumoniae NOT DETECTED NOT DETECTED Final   Proteus species NOT DETECTED NOT DETECTED Final   Salmonella species NOT  DETECTED NOT DETECTED Final   Serratia marcescens NOT DETECTED NOT DETECTED Final   Haemophilus influenzae NOT DETECTED NOT DETECTED Final   Neisseria meningitidis NOT DETECTED NOT DETECTED Final   Pseudomonas aeruginosa NOT DETECTED NOT DETECTED Final   Stenotrophomonas maltophilia NOT DETECTED NOT DETECTED Final   Candida albicans NOT DETECTED NOT DETECTED Final   Candida auris NOT DETECTED NOT DETECTED Final   Candida glabrata NOT DETECTED NOT DETECTED Final   Candida krusei NOT DETECTED NOT DETECTED Final   Candida parapsilosis NOT DETECTED NOT DETECTED Final   Candida tropicalis NOT DETECTED NOT DETECTED Final   Cryptococcus neoformans/gattii NOT DETECTED NOT DETECTED Final   CTX-M ESBL NOT DETECTED NOT DETECTED Final   Carbapenem resistance IMP NOT DETECTED NOT DETECTED Final   Carbapenem resistance KPC NOT DETECTED NOT DETECTED Final   Carbapenem resistance NDM NOT DETECTED NOT DETECTED Final   Carbapenem resist OXA 48 LIKE NOT DETECTED NOT DETECTED Final   Carbapenem resistance VIM NOT DETECTED NOT DETECTED Final    Comment: Performed at [facility], [address], [place], [place] [address]     Radiology Studies: DG CHEST PORT 1 VIEW Result Date: 06/18/2024 CLINICAL DATA:  Shortness of breath EXAM: PORTABLE CHEST 1 VIEW COMPARISON:  Chest x-ray 06/16/2024 FINDINGS: The heart is enlarged. Patient is status post TAVR. There are increased central interstitial markings throughout both lungs. There is no lung consolidation, pleural effusion or pneumothorax. Bilateral shoulder arthroplasties are present. There are atherosclerotic calcifications of the aorta. IMPRESSION: Cardiomegaly with increased central interstitial markings throughout both lungs, likely pulmonary edema. Electronically Signed   By: Greig Pique M.D.   On: 06/18/2024 15:57    Scheduled Meds:  aspirin  EC  81 mg Oral QHS   atorvastatin   80 mg Oral Daily   cefadroxil   500 mg Oral Daily   enoxaparin  (LOVENOX )  injection  30 mg Subcutaneous Q24H   furosemide   40 mg Oral q morning   insulin  aspart  0-9 Units Subcutaneous TID WC   insulin  aspart  3 Units Subcutaneous TID WC   insulin  glargine-yfgn  10 Units Subcutaneous Q24H   levothyroxine   50 mcg Oral QAC breakfast   pantoprazole   40 mg Oral Daily   polyethylene glycol  17 g Oral Daily   senna-docusate  1 tablet Oral BID   Continuous Infusions:     LOS: 4 days   Ivonne Mustache, MD [facility] P7/29/2025, 11:18 AM

## 2024-06-20 NOTE — Care Management Important Message (Signed)
 Important Message  Patient Details  Name: Bonnie Chavez MRN: [id number] Date of Birth: [birth year]   Important Message Given:  Yes - Medicare IM     Claretta Deed 06/20/2024, 2:54 PM

## 2024-06-20 NOTE — Progress Notes (Signed)
 Occupational Therapy Treatment Patient Details Name: Bonnie Chavez MRN: [id number] DOB: January 22, 1934 Today's Date: 06/20/2024   History of present illness Pt is an 89 yo female presenting to [facility] ED on 06/16/24 with back pain and generalized weakness, further workup for sepsis. Imaging demonstrated no acute spinal abnormalities but does display moderate degenerative changes. PMH of chronic diastolic heart failure, CKD 3B, type 2 diabetes mellitus, essential pretension, hyperlipidemia, acquired hypothyroidism, severe aortic stenosis status post TAVR in November 2024.   OT comments  Patient received in supine and asking to use BSC. Patient requiring increased time and min assist for bed mobility and transfer to [facility] with RW. Patient able to perform toilet hygiene seated. Patient ambulated to sink and performed self care seated/standing from sink with mod to max assist for LB ADLs. Patient asked to use BSC again before transfer to recliner. Patient will benefit from continued inpatient follow up therapy, <3 hours/day. Acute OT to continue to follow to address established goals to facilitate DC to next venue of care.        If plan is discharge home, recommend the following:  A little help with walking and/or transfers;A lot of help with bathing/dressing/bathroom;Assist for transportation;Assistance with cooking/housework   Equipment Recommendations  Other (comment) (Pt reports having rec DME)    Recommendations for Other Services      Precautions / Restrictions Precautions Precautions: Fall Precaution/Restrictions Comments: back precautions for comfort. Restrictions Weight Bearing Restrictions Per Provider Order: No       Mobility Bed Mobility Overal bed mobility: Needs Assistance Bed Mobility: Rolling, Sidelying to Sit Rolling: Min assist Sidelying to sit: Min assist       General bed mobility comments: assistance with trunk with patient able to move legs off EOB with increased time     Transfers Overall transfer level: Needs assistance Equipment used: Rolling walker (2 wheels) Transfers: Sit to/from Stand, Bed to chair/wheelchair/BSC Sit to Stand: From elevated surface, Min assist     Step pivot transfers: Min assist     General transfer comment: cues for hand placement and increased time to perform     Balance Overall balance assessment: Needs assistance Sitting-balance support: Feet supported Sitting balance-Leahy Scale: Fair Sitting balance - Comments: EOB   Standing balance support: Single extremity supported, Bilateral upper extremity supported, During functional activity Standing balance-Leahy Scale: Poor Standing balance comment: reliant on RW or sink for support when standing for self care tasks and transfers                           ADL either performed or assessed with clinical judgement   ADL Overall ADL's : Needs assistance/impaired     Grooming: Supervision/safety;Sitting;Wash/dry hands;Wash/dry face;Oral care   Upper Body Bathing: Supervision/ safety;Sitting   Lower Body Bathing: Moderate assistance;Sit to/from stand Lower Body Bathing Details (indicate cue type and reason): assistance for peri area back and patient able to bathe peri area front with CGA for balance and patient using sink for support Upper Body Dressing : Sitting;Set up   Lower Body Dressing: Sitting/lateral leans;Maximal assistance   Toilet Transfer: Minimal assistance;BSC/3in1;Rolling walker (2 wheels) Toilet Transfer Details (indicate cue type and reason): verbal cues for hand placement and min assist to power up and balance Toileting- Clothing Manipulation and Hygiene: Sit to/from stand;Moderate assistance Toileting - Clothing Manipulation Details (indicate cue type and reason): patient able to perform toilet hygiene seated for front and mod assist for back  General ADL Comments: Patient requires increased time to perform self care tasks     Extremity/Trunk Assessment              Vision       Perception     Praxis     Communication Communication Communication: No apparent difficulties   Cognition Arousal: Alert Behavior During Therapy: WFL for tasks assessed/performed Cognition: No apparent impairments                               Following commands: Intact        Cueing   Cueing Techniques: Verbal cues  Exercises      Shoulder Instructions       General Comments      Pertinent Vitals/ Pain       Pain Assessment Pain Assessment: Faces Faces Pain Scale: Hurts little more Pain Location: back Pain Descriptors / Indicators: Aching, Discomfort, Grimacing Pain Intervention(s): Limited activity within patient's tolerance, Monitored during session, Repositioned, RN gave pain meds during session  Home Living                                          Prior Functioning/Environment              Frequency  Min 2X/week        Progress Toward Goals  OT Goals(current goals can now be found in the care plan section)  Progress towards OT goals: Progressing toward goals  Acute Rehab OT Goals Patient Stated Goal: less back pain OT Goal Formulation: With patient Time For Goal Achievement: 07/01/24 Potential to Achieve Goals: Good ADL Goals Pt Will Perform Grooming: with modified independence;standing Pt Will Perform Lower Body Bathing: with modified independence;sitting/lateral leans;with adaptive equipment Pt Will Perform Lower Body Dressing: with modified independence;sitting/lateral leans;sit to/from stand Pt Will Transfer to Toilet: with modified independence;ambulating Pt Will Perform Toileting - Clothing Manipulation and hygiene: with modified independence;sit to/from stand Pt Will Perform Tub/Shower Transfer: Tub transfer;shower seat;with modified independence Additional ADL Goal #2: Pt will complete bed mobility with mod I using log roll to protect back  for comfort.  Plan      Co-evaluation                 AM-PAC OT 6 Clicks Daily Activity     Outcome Measure   Help from another person eating meals?: A Little Help from another person taking care of personal grooming?: A Little Help from another person toileting, which includes using toliet, bedpan, or urinal?: A Lot Help from another person bathing (including washing, rinsing, drying)?: A Lot Help from another person to put on and taking off regular upper body clothing?: A Little Help from another person to put on and taking off regular lower body clothing?: A Lot 6 Click Score: 15    End of Session Equipment Utilized During Treatment: Gait belt;Rolling walker (2 wheels);Oxygen  OT Visit Diagnosis: Unsteadiness on feet (R26.81);Other abnormalities of gait and mobility (R26.89);Pain;Muscle weakness (generalized) (M62.81) Pain - part of body:  (back)   Activity Tolerance Patient tolerated treatment well   Patient Left in chair;with call bell/phone within reach;with chair alarm set   Nurse Communication Mobility status        Time: 9282-9166 OT Time Calculation (min): 76 min  Charges: OT General Charges $OT Visit: 1 Visit  OT Treatments $Self Care/Home Management : 68-82 mins  Dick Laine, OTA Acute Rehabilitation Services  Office [phone]   Jeb LITTIE Laine 06/20/2024, 10:53 AM

## 2024-06-20 NOTE — TOC Progression Note (Signed)
 Transition of Care [facility]) - Progression Note    Patient Details  Name: Bonnie Chavez MRN: [id number] Date of Birth: 10/16/1934  Transition of Care [facility]) CM/SW Contact  Jatavis Malek A [place], LCSW Phone Number: 06/20/2024, 3:50 PM  Clinical Narrative:      CSW spoke with pt's son via secure email regarding pt's DC plan. He stated that he visited [address] and [facility] today and chose [facility] for placement.   CMA assisted with starting insurance authorization, status pending. Hulan Mzq#[id number]   EDD tomorrow, CSW confirming with [facility] regarding bed availability.   CSW will continue to follow.        Expected Discharge Plan: Skilled Nursing Facility Barriers to Discharge: Continued Medical Work up               Expected Discharge Plan and Services In-house Referral: Clinical Social Work     Living arrangements for the past 2 months: Single Family Home                                       Social Drivers of Health (SDOH) Interventions SDOH Screenings   Food Insecurity: No Food Insecurity (06/17/2024)  Housing: Low Risk  (06/17/2024)  Transportation Needs: No Transportation Needs (06/17/2024)  Utilities: Not At Risk (06/17/2024)  Alcohol Screen: Low Risk  (11/06/2022)  Depression (PHQ2-9): Low Risk  (04/03/2024)  Financial Resource Strain: Low Risk  (11/06/2022)  Physical Activity: Insufficiently Active (11/06/2022)  Social Connections: Moderately Integrated (06/17/2024)  Stress: No Stress Concern Present (11/06/2022)  Tobacco Use: Low Risk  (06/17/2024)    Readmission Risk Interventions     No data to display

## 2024-06-21 ENCOUNTER — Inpatient Hospital Stay (HOSPITAL_COMMUNITY)

## 2024-06-21 DIAGNOSIS — R531 Weakness: Secondary | ICD-10-CM | POA: Diagnosis not present

## 2024-06-21 LAB — BASIC METABOLIC PANEL WITH GFR
Anion gap: 11 (ref 5–15)
BUN: 50 mg/dL — ABNORMAL HIGH (ref 8–23)
CO2: 23 mmol/L (ref 22–32)
Calcium: 9.2 mg/dL (ref 8.9–10.3)
Chloride: 97 mmol/L — ABNORMAL LOW (ref 98–111)
Creatinine, Ser: 2.07 mg/dL — ABNORMAL HIGH (ref 0.44–1.00)
GFR, Estimated: 23 mL/min — ABNORMAL LOW (ref >60)
Glucose, Bld: 120 mg/dL — ABNORMAL HIGH (ref 70–99)
Potassium: 3.6 mmol/L (ref 3.5–5.1)
Sodium: 131 mmol/L — ABNORMAL LOW (ref 135–145)

## 2024-06-21 LAB — GLUCOSE, CAPILLARY
Glucose-Capillary: 141 mg/dL — ABNORMAL HIGH (ref 70–99)
Glucose-Capillary: 207 mg/dL — ABNORMAL HIGH (ref 70–99)
Glucose-Capillary: 126 mg/dL — ABNORMAL HIGH (ref 70–99)
Glucose-Capillary: 134 mg/dL — ABNORMAL HIGH (ref 70–99)

## 2024-06-21 MED ORDER — DICYCLOMINE HCL 20 MG PO TABS
20.0000 mg | ORAL_TABLET | Freq: Three times a day (TID) | ORAL | Status: DC
  Administered 2024-06-21 – 2024-06-22 (×3): 20 mg via ORAL
  Filled 2024-06-21 (×6): qty 1

## 2024-06-21 MED ORDER — BISACODYL 10 MG RE SUPP
10.0000 mg | Freq: Once | RECTAL | Status: AC
  Administered 2024-06-21: 10 mg via RECTAL
  Filled 2024-06-21: qty 1

## 2024-06-21 NOTE — TOC Progression Note (Addendum)
 Transition of Care [facility]) - Progression Note    Patient Details  Name: Bonnie Chavez MRN: [id number] Date of Birth: August 31, 1934  Transition of Care [facility]) CM/SW Contact  Aleecia Tapia A [place], LCSW Phone Number: 06/21/2024, 1:01 PM  Clinical Narrative:     Update 1620  Pt's auth approved for [facility].  Approval Dates:  7/30 - 8/5 NRD 8/6  Rzmu#[id number]   1301 Pt's authorization remains pending for [facility].   MD notified CSW to plan for possible DC tomorrow. [facility] notified.    CSW will continue to follow.   Expected Discharge Plan: Skilled Nursing Facility Barriers to Discharge: Continued Medical Work up               Expected Discharge Plan and Services In-house Referral: Clinical Social Work     Living arrangements for the past 2 months: Single Family Home                                       Social Drivers of Health (SDOH) Interventions SDOH Screenings   Food Insecurity: No Food Insecurity (06/17/2024)  Housing: Low Risk  (06/17/2024)  Transportation Needs: No Transportation Needs (06/17/2024)  Utilities: Not At Risk (06/17/2024)  Alcohol Screen: Low Risk  (11/06/2022)  Depression (PHQ2-9): Low Risk  (04/03/2024)  Financial Resource Strain: Low Risk  (11/06/2022)  Physical Activity: Insufficiently Active (11/06/2022)  Social Connections: Moderately Integrated (06/17/2024)  Stress: No Stress Concern Present (11/06/2022)  Tobacco Use: Low Risk  (06/17/2024)    Readmission Risk Interventions     No data to display

## 2024-06-21 NOTE — Plan of Care (Signed)

## 2024-06-21 NOTE — Progress Notes (Signed)
 Physical Therapy Treatment Patient Details Name: Bonnie Chavez MRN: [id number] DOB: [birth year] Today's Date: 06/21/2024   History of Present Illness Pt is an 89 yo female presenting to [facility] ED on 06/16/24 with back pain and generalized weakness, further workup for sepsis. Imaging demonstrated no acute spinal abnormalities but does display moderate degenerative changes. PMH of chronic diastolic heart failure, CKD 3B, type 2 diabetes mellitus, essential pretension, hyperlipidemia, acquired hypothyroidism, severe aortic stenosis status post TAVR in November 2024.    PT Comments  Pt continues with low back pain requiring increased assist for transfers, ambulation, and ADLs. Pt requiring significant increase in time to perform tasks. Pt completed 2 step pvt transfers with use of RW. Pt moving very slow and cautious due to back pain. Continue to recommend inpatient rehab program < 3 hrs a day to progress towards indep mobility. Acute PT to cont to follow.    If plan is discharge home, recommend the following: A lot of help with walking and/or transfers;A lot of help with bathing/dressing/bathroom;Assistance with cooking/housework;Direct supervision/assist for medications management;Direct supervision/assist for financial management;Assist for transportation;Help with stairs or ramp for entrance;Supervision due to cognitive status   Can travel by private vehicle     No  Equipment Recommendations  None recommended by PT    Recommendations for Other Services       Precautions / Restrictions Precautions Precautions: Fall Precaution/Restrictions Comments: back precautions for comfort. Restrictions Weight Bearing Restrictions Per Provider Order: No     Mobility  Bed Mobility Overal bed mobility: Needs Assistance Bed Mobility: Rolling, Sidelying to Sit Rolling: Min assist Sidelying to sit: Mod assist       General bed mobility comments: assistance with trunk with patient able to move legs off  EOB with increased time, modA for trunk elevation and to scoot hips to EOB    Transfers Overall transfer level: Needs assistance Equipment used: Rolling walker (2 wheels) Transfers: Sit to/from Stand, Bed to chair/wheelchair/BSC Sit to Stand: From elevated surface, Mod assist   Step pivot transfers: Mod assist       General transfer comment: cues for hand placement and increased time to perform, short step height, noted back pain, extremely slow moving    Ambulation/Gait                   Stairs             Wheelchair Mobility     Tilt Bed    Modified Rankin (Stroke Patients Only)       Balance Overall balance assessment: Needs assistance Sitting-balance support: Feet supported Sitting balance-Leahy Scale: Fair Sitting balance - Comments: EOB Postural control: Left lateral lean Standing balance support: Single extremity supported, Bilateral upper extremity supported, During functional activity Standing balance-Leahy Scale: Poor Standing balance comment: reliant on RW, attempted to perform pericare s/p using BSC but unable to reach around backside                            Communication Communication Communication: No apparent difficulties  Cognition Arousal: Alert Behavior During Therapy: WFL for tasks assessed/performed                             Following commands: Intact      Cueing Cueing Techniques: Verbal cues  Exercises      General Comments General comments (skin integrity, edema, etc.): VSS, assisted to [facility], modA for  pericare      Pertinent Vitals/Pain Pain Assessment Pain Assessment: Faces Faces Pain Scale: Hurts even more Pain Location: back Pain Descriptors / Indicators: Aching, Discomfort, Grimacing    Home Living                          Prior Function            PT Goals (current goals can now be found in the care plan section) Acute Rehab PT Goals PT Goal Formulation: With  patient Time For Goal Achievement: 07/01/24 Potential to Achieve Goals: Fair Progress towards PT goals: Progressing toward goals    Frequency    Min 2X/week      PT Plan      Co-evaluation              AM-PAC PT 6 Clicks Mobility   Outcome Measure  Help needed turning from your back to your side while in a flat bed without using bedrails?: A Little Help needed moving from lying on your back to sitting on the side of a flat bed without using bedrails?: A Little Help needed moving to and from a bed to a chair (including a wheelchair)?: A Little Help needed standing up from a chair using your arms (e.g., wheelchair or bedside chair)?: A Lot Help needed to walk in hospital room?: A Lot Help needed climbing 3-5 steps with a railing? : A Lot 6 Click Score: 15    End of Session Equipment Utilized During Treatment: Gait belt Activity Tolerance: Patient limited by pain;Patient limited by fatigue Patient left: in chair;with call bell/phone within reach;with chair alarm set;with family/visitor present Nurse Communication: Mobility status PT Visit Diagnosis: Unsteadiness on feet (R26.81);Other abnormalities of gait and mobility (R26.89);Muscle weakness (generalized) (M62.81);Difficulty in walking, not elsewhere classified (R26.2)     Time: 8768-8750 PT Time Calculation (min) (ACUTE ONLY): 18 min  Charges:    $Therapeutic Activity: 8-22 mins PT General Charges $$ ACUTE PT VISIT: 1 Visit                     Norene Ames, PT, DPT Acute Rehabilitation Services Secure chat preferred Office #: [phone]    [name] 06/21/2024, 1:20 PM

## 2024-06-21 NOTE — Progress Notes (Signed)
 PROGRESS NOTE  Bonnie Chavez  FMW:[id number] DOB: [birth year] DOA: 06/16/2024 PCP: Mercer Clotilda SAUNDERS, MD   Brief Narrative: Patient is a 89 year old female with history of chronic diastolic CHF, CKD stage IIIb with baseline creatinine of 1.4-1.7, type 2 diabetes, hypertension, hyperlipidemia hypothyroidism, severe aortic stenosis status post TAVR in November 2024 who presented to the emergency department with complaint of generalized weakness, dysuria for 1 to 2 days, subjective fever.  On presentation ,she was febrile with a stable blood pressure.  Lab work showed sodium of 131, potassium of 3.2, creatinine of 1.3, WBC count of 16, lactic acid of 2.7.  UA suspicious for UTI.  Blood culture, urine culture sent,started on ceftriaxone , IV fluid.   Blood cultures /urine culture showed E. coli.  Abx changed to oral.PT is recommending SNF on discharge.  Waiting for bed.    Assessment & Plan:  Principal Problem:   Generalized weakness Active Problems:   DM2 (diabetes mellitus, type 2) (HCC)   Essential hypertension   HLD (hyperlipidemia)   Chronic diastolic CHF (congestive heart failure) (HCC)   CKD stage 3b, GFR 30-44 ml/min (HCC)   Severe sepsis (HCC)   Acute cystitis   Acquired hypothyroidism  Generalized weakness: Secondary to bacteremia/UTI.  PT/OT consulted.  TSH, Vitamin B12 normal  Severe sepsis secondary to E. coli bacteremia/acute cystitis: Presented with fever, weakness, dysuria, leukocytosis, lactic acidosis.  Given gentle IV fluids.  Blood culture/urine culture showed  E. coli.  .  lactic acidosis has resolved.  Leukocytosis improved.  Antibiotics changed to oral.  CKD stage IIIb: Baseline creatinine around 1.4-1.7.  Follows with [facility]..  She had crackles on bilateral bases and was given Lasix  on 7/27.    BNP was elevated Given  her 2 dose of Lasix .  Creatinine trended up to the range of 2.   Appears more euvolemic today.  Restarted home Lasix    Acute hypoxic respiratory  failure: She was  requiring 1 to 2 L of oxygen per minute earlier .CXR  on 7/27 showed cardiomegaly with increased central interstitial markings throughout both lungs, likely pulmonary edema.  No crackles auscultated today.  Now weaned to room air  Hypokalemia: Supplemented and corrected  Chronic HFpEF: Last echo had shown EF of 55%, grade 1 diastolic dysfunction, normal right ventricular function. Takes Lasix  at home.  Elevated BNP.  Given iv lasix  on 7/27.  Continue oral Lasix    Diabetes type 2: Takes insulin  at home.  Monitor blood sugars.  Blood sugars running high, continue current insulin  regimen.Last Hb A1c of 7.6  Hyperlipidemia: On statin at home  Hypertension: Currently blood pressure soft.  Home beta-blocker, amlodipine  on hold.  Hypothyroidism: Continue Synthyroid  Left flank pain: Unclear etiology.  CT renal study, lumbar spine x-ray done earlier did not show any acute findings.  Ordered oxycodone  and muscle relaxant.  Flank pain better but has not entirely gone  Continue bowel regimen.  Will order repeat CT abdomen/pelvis without contrast.  Debility/deconditioning: Patient seen by PT and OT and recommended SNF on discharge         DVT prophylaxis:enoxaparin  (LOVENOX ) injection 30 mg Start: 06/18/24 1445 SCDs Start: 06/16/24 2350     Code Status: Full Code  Family Communication: Called son [name] and discussed about management plan on  7/30.Called again today,call not received  Patient status:Inpatient  Patient is from :Home  Anticipated discharge to: SNF  Estimated DC date:1-2 days   Consultants: None  Procedures:None  Antimicrobials:  Anti-infectives (From admission, onward)  Start     Dose/Rate Route Frequency Ordered Stop   06/20/24 1000  cefadroxil  (DURICEF) capsule 500 mg        500 mg Oral Daily 06/19/24 1051 06/27/24 0959   06/20/24 1000  cefTRIAXone  (ROCEPHIN ) 2 g in sodium chloride  0.9 % 100 mL IVPB        2 g 200 mL/hr over 30 Minutes  Intravenous Every 24 hours 06/19/24 1051 06/20/24 0902   06/18/24 1000  cefTRIAXone  (ROCEPHIN ) 2 g in sodium chloride  0.9 % 100 mL IVPB  Status:  Discontinued        2 g 200 mL/hr over 30 Minutes Intravenous Every 24 hours 06/17/24 1032 06/19/24 1051   06/17/24 1130  cefTRIAXone  (ROCEPHIN ) 1 g in sodium chloride  0.9 % 100 mL IVPB        1 g 200 mL/hr over 30 Minutes Intravenous  Once 06/17/24 1034 06/17/24 1314   06/17/24 0000  cefTRIAXone  (ROCEPHIN ) 1 g in sodium chloride  0.9 % 100 mL IVPB  Status:  Discontinued        1 g 200 mL/hr over 30 Minutes Intravenous Every 24 hours 06/16/24 2351 06/17/24 1032   06/16/24 1900  ceFEPIme  (MAXIPIME ) 2 g in sodium chloride  0.9 % 100 mL IVPB        2 g 200 mL/hr over 30 Minutes Intravenous  Once 06/16/24 1858 06/16/24 1958   06/16/24 1900  metroNIDAZOLE  (FLAGYL ) IVPB 500 mg        500 mg 100 mL/hr over 60 Minutes Intravenous  Once 06/16/24 1858 06/16/24 2203   06/16/24 1900  vancomycin  (VANCOCIN ) IVPB 1000 mg/200 mL premix        1,000 mg 200 mL/hr over 60 Minutes Intravenous  Once 06/16/24 1858 06/16/24 2225       Subjective: Patient seen and examined at the bedside today.  Lying in bed.  On room air.  Not coughing or short of breath.  He still having some discomfort on the left flank.  We discussed about doing a CT abdomen suspenders without contrast.  Objective: Vitals:   06/21/24 0019 06/21/24 0428 06/21/24 0500 06/21/24 0817  BP: 134/60 (!) 122/95  131/63  Pulse: 65 63  65  Resp: 19 19  18   Temp: 98.3 F (36.8 C) 98.1 F (36.7 C)  98.2 F (36.8 C)  TempSrc:      SpO2: 94% 96%  97%  Weight:   84.8 kg   Height:        Intake/Output Summary (Last 24 hours) at 06/21/2024 1135 Last data filed at 06/21/2024 0558 Gross per 24 hour  Intake 237 ml  Output 400 ml  Net -163 ml    Filed Weights   06/16/24 1309 06/20/24 0500 06/21/24 0500  Weight: 84.8 kg 84.8 kg 84.8 kg    Examination:   General exam: Overall comfortable, not in  distress,, pleasant elderly female HEENT: PERRL Respiratory system:  no wheezes or crackles, mild diminished sounds on bases Cardiovascular system: S1 & S2 heard, RRR.  Systolic murmur Gastrointestinal system: Abdomen is nondistended, soft and nontender.  No tenderness in the left flank Central nervous system: Alert and oriented Extremities: No edema, no clubbing ,no cyanosis Skin: No rashes, no ulcers,no icterus        Data Reviewed: I have personally reviewed following labs and imaging studies  CBC: Recent Labs  Lab 06/16/24 1645 06/17/24 0221 06/18/24 0307 06/19/24 0420  WBC 16.3* 18.6* 13.2* 11.1*  NEUTROABS 14.1* 16.5*  --   --  HGB 11.3* 12.0 10.7* 10.3*  HCT 33.7* 35.8* 32.4* 30.1*  MCV 95.5 96.2 97.0 93.2  PLT 124* 114* 92* 100*   Basic Metabolic Panel: Recent Labs  Lab 06/17/24 0221 06/18/24 1012 06/19/24 0420 06/20/24 0404 06/21/24 0241  NA 133* 132* 131* 132* 131*  K 4.2 3.8 3.7 3.6 3.6  CL 99 100 95* 95* 97*  CO2 20* 23 23 24 23   GLUCOSE 244* 235* 261* 226* 120*  BUN 33* 47* 52* 51* 50*  CREATININE 1.59* 2.60* 2.47* 2.11* 2.07*  CALCIUM  9.7 9.1 9.5 9.2 9.2  MG 1.6*  --  2.2  --   --      Recent Results (from the past 240 hours)  Resp panel by RT-PCR (RSV, Flu A&B, Covid) Anterior Nasal Swab     Status: None   Collection Time: 06/16/24  4:36 PM   Specimen: Anterior Nasal Swab  Result Value Ref Range Status   SARS Coronavirus 2 by RT PCR NEGATIVE NEGATIVE Final   Influenza A by PCR NEGATIVE NEGATIVE Final   Influenza B by PCR NEGATIVE NEGATIVE Final    Comment: (NOTE) The Xpert Xpress SARS-CoV-2/FLU/RSV plus assay is intended as an aid in the diagnosis of influenza from Nasopharyngeal swab specimens and should not be used as a sole basis for treatment. Nasal washings and aspirates are unacceptable for Xpert Xpress SARS-CoV-2/FLU/RSV testing.  Fact Sheet for Patients: BloggerCourse.com  Fact Sheet for Healthcare  Providers: SeriousBroker.it  This test is not yet approved or cleared by the [place]  FDA and has been authorized for detection and/or diagnosis of SARS-CoV-2 by FDA under an Emergency Use Authorization (EUA). This EUA will remain in effect (meaning this test can be used) for the duration of the COVID-19 declaration under Section 564(b)(1) of the Act, 21 U.S.C. section 360bbb-3(b)(1), unless the authorization is terminated or revoked.     Resp Syncytial Virus by PCR NEGATIVE NEGATIVE Final    Comment: (NOTE) Fact Sheet for Patients: BloggerCourse.com  Fact Sheet for Healthcare Providers: SeriousBroker.it  This test is not yet approved or cleared by the [place]  FDA and has been authorized for detection and/or diagnosis of SARS-CoV-2 by FDA under an Emergency Use Authorization (EUA). This EUA will remain in effect (meaning this test can be used) for the duration of the COVID-19 declaration under Section 564(b)(1) of the Act, 21 U.S.C. section 360bbb-3(b)(1), unless the authorization is terminated or revoked.  Performed at [facility], [address] [place], [place] [address]   Urine Culture     Status: Abnormal   Collection Time: 06/16/24  4:36 PM   Specimen: Urine, Clean Catch  Result Value Ref Range Status   Specimen Description URINE, CLEAN CATCH  Final   Special Requests   Final    NONE Performed at [facility], [address], [place], [place] [address]    Culture 90,000 COLONIES/mL ESCHERICHIA COLI (A)  Final   Report Status 06/18/2024 FINAL  Final   Organism ID, Bacteria ESCHERICHIA COLI (A)  Final      Susceptibility   Escherichia coli - MIC*    AMPICILLIN 8 SENSITIVE Sensitive     CEFAZOLIN  <=4 SENSITIVE Sensitive     CEFEPIME  <=0.12 SENSITIVE Sensitive     CEFTRIAXONE  <=0.25 SENSITIVE Sensitive     CIPROFLOXACIN <=0.25 SENSITIVE Sensitive     GENTAMICIN <=1  SENSITIVE Sensitive     IMIPENEM <=0.25 SENSITIVE Sensitive     NITROFURANTOIN <=16 SENSITIVE Sensitive  TRIMETH/SULFA <=20 SENSITIVE Sensitive     AMPICILLIN/SULBACTAM 4 SENSITIVE Sensitive     PIP/TAZO <=4 SENSITIVE Sensitive ug/mL    * 90,000 COLONIES/mL ESCHERICHIA COLI  Blood culture (routine x 2)     Status: Abnormal   Collection Time: 06/16/24  4:45 PM   Specimen: BLOOD  Result Value Ref Range Status   Specimen Description BLOOD LEFT ANTECUBITAL  Final   Special Requests   Final    BOTTLES DRAWN AEROBIC AND ANAEROBIC Blood Culture results may not be optimal due to an inadequate volume of blood received in culture bottles   Culture  Setup Time   Final    GRAM NEGATIVE RODS IN BOTH AEROBIC AND ANAEROBIC BOTTLES CRITICAL VALUE NOTED.  VALUE IS CONSISTENT WITH PREVIOUSLY REPORTED AND CALLED VALUE.    Culture (A)  Final    ESCHERICHIA COLI SUSCEPTIBILITIES PERFORMED ON PREVIOUS CULTURE WITHIN THE LAST 5 DAYS. Performed at [facility], [address], [place], [place] [address]    Report Status 06/19/2024 FINAL  Final  Blood culture (routine x 2)     Status: Abnormal   Collection Time: 06/16/24  6:00 PM   Specimen: BLOOD  Result Value Ref Range Status   Specimen Description BLOOD SITE NOT SPECIFIED  Final   Special Requests   Final    BOTTLES DRAWN AEROBIC AND ANAEROBIC Blood Culture adequate volume   Culture  Setup Time   Final    GRAM NEGATIVE RODS IN BOTH AEROBIC AND ANAEROBIC BOTTLES CRITICAL RESULT CALLED TO, READ BACK BY AND VERIFIED WITH: [facility] JADE DENNINGER [id number] BY EC Performed at [facility], [address] [place], [place] [address]    Culture ESCHERICHIA COLI (A)  Final   Report Status 06/19/2024 FINAL  Final   Organism ID, Bacteria ESCHERICHIA COLI  Final   Organism ID, Bacteria ESCHERICHIA COLI  Final      Susceptibility   Escherichia coli - KIRBY BAUER*    CEFAZOLIN  SENSITIVE Sensitive    Escherichia coli - MIC*    AMPICILLIN  8 SENSITIVE Sensitive     CEFEPIME  <=0.12 SENSITIVE Sensitive     CEFTAZIDIME <=1 SENSITIVE Sensitive     CEFTRIAXONE  <=0.25 SENSITIVE Sensitive     CIPROFLOXACIN <=0.25 SENSITIVE Sensitive     GENTAMICIN <=1 SENSITIVE Sensitive     IMIPENEM 0.5 SENSITIVE Sensitive     TRIMETH/SULFA <=20 SENSITIVE Sensitive     AMPICILLIN/SULBACTAM 4 SENSITIVE Sensitive     PIP/TAZO 32 INTERMEDIATE Intermediate ug/mL    * ESCHERICHIA COLI    ESCHERICHIA COLI  Blood Culture ID Panel (Reflexed)     Status: Abnormal   Collection Time: 06/16/24  6:00 PM  Result Value Ref Range Status   Enterococcus faecalis NOT DETECTED NOT DETECTED Final   Enterococcus Faecium NOT DETECTED NOT DETECTED Final   Listeria monocytogenes NOT DETECTED NOT DETECTED Final   Staphylococcus species NOT DETECTED NOT DETECTED Final   Staphylococcus aureus (BCID) NOT DETECTED NOT DETECTED Final   Staphylococcus epidermidis NOT DETECTED NOT DETECTED Final   Staphylococcus lugdunensis NOT DETECTED NOT DETECTED Final   Streptococcus species NOT DETECTED NOT DETECTED Final   Streptococcus agalactiae NOT DETECTED NOT DETECTED Final   Streptococcus pneumoniae NOT DETECTED NOT DETECTED Final   Streptococcus pyogenes NOT DETECTED NOT DETECTED Final   A.calcoaceticus-baumannii NOT DETECTED NOT DETECTED Final   Bacteroides fragilis NOT DETECTED NOT DETECTED Final   Enterobacterales DETECTED (A) NOT DETECTED Final    Comment: Enterobacterales represent a  large order of gram negative bacteria, not a single organism. CRITICAL RESULT CALLED TO, READ BACK BY AND VERIFIED WITH: [facility] JADE DENNINGER [id number] AT 1025 BY EC    Enterobacter cloacae complex NOT DETECTED NOT DETECTED Final   Escherichia coli DETECTED (A) NOT DETECTED Final    Comment: CRITICAL RESULT CALLED TO, READ BACK BY AND VERIFIED WITH: PHARMD JADE DENNINGER [id number] AT 1025 BY EC    Klebsiella aerogenes NOT DETECTED NOT DETECTED Final   Klebsiella oxytoca NOT DETECTED NOT  DETECTED Final   Klebsiella pneumoniae NOT DETECTED NOT DETECTED Final   Proteus species NOT DETECTED NOT DETECTED Final   Salmonella species NOT DETECTED NOT DETECTED Final   Serratia marcescens NOT DETECTED NOT DETECTED Final   Haemophilus influenzae NOT DETECTED NOT DETECTED Final   Neisseria meningitidis NOT DETECTED NOT DETECTED Final   Pseudomonas aeruginosa NOT DETECTED NOT DETECTED Final   Stenotrophomonas maltophilia NOT DETECTED NOT DETECTED Final   Candida albicans NOT DETECTED NOT DETECTED Final   Candida auris NOT DETECTED NOT DETECTED Final   Candida glabrata NOT DETECTED NOT DETECTED Final   Candida krusei NOT DETECTED NOT DETECTED Final   Candida parapsilosis NOT DETECTED NOT DETECTED Final   Candida tropicalis NOT DETECTED NOT DETECTED Final   Cryptococcus neoformans/gattii NOT DETECTED NOT DETECTED Final   CTX-M ESBL NOT DETECTED NOT DETECTED Final   Carbapenem resistance IMP NOT DETECTED NOT DETECTED Final   Carbapenem resistance KPC NOT DETECTED NOT DETECTED Final   Carbapenem resistance NDM NOT DETECTED NOT DETECTED Final   Carbapenem resist OXA 48 LIKE NOT DETECTED NOT DETECTED Final   Carbapenem resistance VIM NOT DETECTED NOT DETECTED Final    Comment: Performed at [facility], [address] [place], [place] [address]     Radiology Studies: No results found.   Scheduled Meds:  aspirin  EC  81 mg Oral QHS   atorvastatin   80 mg Oral Daily   cefadroxil   500 mg Oral Daily   enoxaparin  (LOVENOX ) injection  30 mg Subcutaneous Q24H   feeding supplement (GLUCERNA SHAKE)  237 mL Oral TID BM   furosemide   40 mg Oral q morning   insulin  aspart  0-9 Units Subcutaneous TID WC   insulin  aspart  3 Units Subcutaneous TID WC   insulin  glargine-yfgn  10 Units Subcutaneous Q24H   levothyroxine   50 mcg Oral QAC breakfast   pantoprazole   40 mg Oral Daily   polyethylene glycol  17 g Oral Daily   senna-docusate  1 tablet Oral BID   Continuous Infusions:      LOS: 5 days   Ivonne Mustache, MD [facility] P7/30/2025, 11:35 AM

## 2024-06-22 DIAGNOSIS — R531 Weakness: Secondary | ICD-10-CM | POA: Diagnosis not present

## 2024-06-22 LAB — BASIC METABOLIC PANEL WITH GFR
Anion gap: 10 (ref 5–15)
BUN: 45 mg/dL — ABNORMAL HIGH (ref 8–23)
CO2: 25 mmol/L (ref 22–32)
Calcium: 9.2 mg/dL (ref 8.9–10.3)
Chloride: 99 mmol/L (ref 98–111)
Creatinine, Ser: 2.09 mg/dL — ABNORMAL HIGH (ref 0.44–1.00)
GFR, Estimated: 22 mL/min — ABNORMAL LOW (ref >60)
Glucose, Bld: 142 mg/dL — ABNORMAL HIGH (ref 70–99)
Potassium: 3.7 mmol/L (ref 3.5–5.1)
Sodium: 134 mmol/L — ABNORMAL LOW (ref 135–145)

## 2024-06-22 LAB — CBC
HCT: 28.7 % — ABNORMAL LOW (ref 36.0–46.0)
Hemoglobin: 9.8 g/dL — ABNORMAL LOW (ref 12.0–15.0)
MCH: 31.8 pg (ref 26.0–34.0)
MCHC: 34.1 g/dL (ref 30.0–36.0)
MCV: 93.2 fL (ref 80.0–100.0)
Platelets: 165 K/uL (ref 150–400)
RBC: 3.08 MIL/uL — ABNORMAL LOW (ref 3.87–5.11)
RDW: 14.1 % (ref 11.5–15.5)
WBC: 12.7 K/uL — ABNORMAL HIGH (ref 4.0–10.5)
nRBC: 0 % (ref 0.0–0.2)

## 2024-06-22 LAB — GLUCOSE, CAPILLARY: Glucose-Capillary: 161 mg/dL — ABNORMAL HIGH (ref 70–99)

## 2024-06-22 MED ORDER — GLUCERNA SHAKE PO LIQD: 237.0000 mL | Freq: Three times a day (TID) | ORAL | Status: AC

## 2024-06-22 MED ORDER — POLYETHYLENE GLYCOL 3350 17 G PO PACK: 17.0000 g | PACK | Freq: Every day | ORAL | Status: DC

## 2024-06-22 MED ORDER — SENNOSIDES-DOCUSATE SODIUM 8.6-50 MG PO TABS: 1.0000 | ORAL_TABLET | Freq: Two times a day (BID) | ORAL | Status: DC

## 2024-06-22 MED ORDER — PANTOPRAZOLE SODIUM 40 MG PO TBEC: 40.0000 mg | DELAYED_RELEASE_TABLET | Freq: Every day | ORAL | Status: DC

## 2024-06-22 MED ORDER — OXYCODONE HCL 5 MG PO TABS: 5.0000 mg | ORAL_TABLET | Freq: Four times a day (QID) | ORAL | 0 refills | Status: DC | PRN

## 2024-06-22 MED ORDER — ATORVASTATIN CALCIUM 80 MG PO TABS: 80.0000 mg | ORAL_TABLET | Freq: Every day | ORAL | Status: DC

## 2024-06-22 MED ORDER — CEFADROXIL 500 MG PO CAPS: 500.0000 mg | ORAL_CAPSULE | Freq: Every day | ORAL | Status: AC

## 2024-06-22 NOTE — Discharge Summary (Addendum)
 Physician Discharge Summary  Bonnie Chavez FMW:[id number] DOB: 1934/02/28 DOA: 06/16/2024  PCP: Bonnie Clotilda SAUNDERS, MD  Admit date: 06/16/2024 Discharge date: 06/22/2024  Admitted From: Home Disposition:SNF  Discharge Condition:Stable CODE STATUS:FULL Diet recommendation: Carb Modified   Brief/Interim Summary: Patient is a 89 year old female with history of chronic diastolic CHF, CKD stage IIIb with baseline creatinine of 1.4-1.7, type 2 diabetes, hypertension, hyperlipidemia hypothyroidism, severe aortic stenosis status post TAVR in November 2024 who presented to the emergency department with complaint of generalized weakness, dysuria for 1 to 2 days, subjective fever.  On presentation ,she was febrile with a stable blood pressure.  Lab work showed sodium of 131, potassium of 3.2, creatinine of 1.3, WBC count of 16, lactic acid of 2.7.  UA suspicious for UTI.  Blood culture, urine culture sent,started on ceftriaxone , IV fluid.   Blood cultures /urine culture showed E. coli.  Sepsis physiology has resolved.  Abx changed to oral.PT is recommending SNF on discharge.  Medically stable for discharge today.  Following problems were addressed during the hospitalization:   Generalized weakness: Secondary to bacteremia/UTI.  PT/OT consulted.  TSH, Vitamin B12 normal   Severe sepsis secondary to E. coli bacteremia/acute cystitis: Presented with fever, weakness, dysuria, leukocytosis, lactic acidosis.  Given gentle IV fluids.  Blood culture/urine culture showed  E. coli.  .  lactic acidosis has resolved.  Leukocytosis improved.  Antibiotics changed to oral.   AKI on CKD stage IIIb: Baseline creatinine around 1.4-1.7.  Follows with [facility].  She had crackles on bilateral bases and was given 2 doses of Lasix  on 7/27.    BNP was elevated  Creatinine trended up to the range of 2.  Creatinine plateaued around 2.  She looks euvolemic.  Will continue Lasix  at current dose.  I have sent message to her  nephrologist to arrange outpatient follow-up.  She needs to check BMP in 5 days   Acute hypoxic respiratory failure: She was  requiring 1 to 2 L of oxygen per minute earlier .CXR  on 7/27 showed cardiomegaly with increased central interstitial markings throughout both lungs, likely pulmonary edema.  Given IV lasix .  Now weaned to room air   Hypokalemia: Supplemented and corrected   Chronic HFpEF: Last echo had shown EF of 55%, grade 1 diastolic dysfunction, normal right ventricular function. Takes Lasix  at home.   Continue oral Lasix     Diabetes type 2: Takes insulin  at home.  Monitor blood sugars. Last Hb A1c of 7.6   Hyperlipidemia: On statin at home   Hypertension: Currently blood pressure soft.  Home beta-blocker, amlodipine  will be stopped.  May need to resume in the future if blood pressure trends up   Hypothyroidism: Continue Synthyroid   Left flank pain: Unclear etiology.  CT renal study, lumbar spine x-ray done on admission did not show any acute findings.  Ordered oxycodone  and muscle relaxant. Continue bowel regimen.  Repeat CT abdomen/pelvis without contrast showed small hiatal hernia, no other acute findings.  Pain is better today.  Continue muscle relaxant   Debility/deconditioning: Patient seen by PT and OT and recommended SNF on discharge    Discharge Diagnoses:  Principal Problem:   Generalized weakness Active Problems:   DM2 (diabetes mellitus, type 2) (HCC)   Essential hypertension   HLD (hyperlipidemia)   Chronic diastolic CHF (congestive heart failure) (HCC)   CKD stage 3b, GFR 30-44 ml/min (HCC)   Severe sepsis (HCC)   Acute cystitis   Acquired hypothyroidism    Discharge Instructions  Discharge Instructions     Diet Carb Modified   Complete by: As directed    Discharge instructions   Complete by: As directed    1) Please take prescribed medications as instructed 2)Follow up with your nephrologist as an outpatient 3)Do a BMP test to check your  kidney function in 5 days   Increase activity slowly   Complete by: As directed       Allergies as of 06/22/2024       Reactions   Lisinopril  Hives   Cough, hives, and lip edema   Nsaids Other (See Comments)   Abnormal renal function   Penicillins Rash, Other (See Comments)   Pt has taken Keflex without difficulty Has patient had a PCN reaction causing immediate rash, facial/tongue/throat swelling, SOB or lightheadedness with hypotension: yes Has patient had a PCN reaction causing severe rash involving mucus membranes or skin necrosis: no Has patient had a PCN reaction that required hospitalization no Has patient had a PCN reaction occurring within the last 10 years: no If all of the above answers are NO, then may proceed with Cephalosporin use.        Medication List     STOP taking these medications    amLODipine  2.5 MG tablet Commonly known as: NORVASC    amLODipine  5 MG tablet Commonly known as: NORVASC    nebivolol  10 MG tablet Commonly known as: BYSTOLIC    rosuvastatin  40 MG tablet Commonly known as: CRESTOR        TAKE these medications    acetaminophen  500 MG tablet Commonly known as: TYLENOL  Take 500 mg by mouth every 6 (six) hours as needed (Arthritis).   aspirin  EC 81 MG tablet Take 81 mg by mouth at bedtime. Swallow whole.   atorvastatin  80 MG tablet Commonly known as: LIPITOR Take 1 tablet (80 mg total) by mouth daily. Start taking on: June 23, 2024   BLUE-EMU HEMP EX Apply 1 application  topically daily as needed (pain).   cefadroxil  500 MG capsule Commonly known as: DURICEF Take 1 capsule (500 mg total) by mouth daily for 4 days. Start taking on: June 23, 2024   cinacalcet  30 MG tablet Commonly known as: SENSIPAR  Take 30 mg by mouth See admin instructions. Take one tablet by mouth Monday-Friday until discontinued by PCP.   feeding supplement (GLUCERNA SHAKE) Liqd Take 237 mLs by mouth 3 (three) times daily between meals.    furosemide  20 MG tablet Commonly known as: LASIX  Take 40 mg by mouth every morning. Can take additional tablet if needed   HumuLIN 70/30 KwikPen (70-30) 100 UNIT/ML KwikPen Generic drug: insulin  isophane & regular human KwikPen Inject 6-8 Units into the skin See admin instructions. 6 units in the AM and 8 units in the pm   levothyroxine  50 MCG tablet Commonly known as: SYNTHROID  Take 50 mcg by mouth daily before breakfast.   methocarbamol  500 MG tablet Commonly known as: ROBAXIN  Take 1 tablet (500 mg total) by mouth every 8 (eight) hours as needed for muscle spasms.   pantoprazole  40 MG tablet Commonly known as: PROTONIX  Take 1 tablet (40 mg total) by mouth daily. Start taking on: June 23, 2024   polyethylene glycol 17 g packet Commonly known as: MIRALAX  / GLYCOLAX  Take 17 g by mouth daily. Start taking on: June 23, 2024   senna-docusate 8.6-50 MG tablet Commonly known as: Senokot-S Take 1 tablet by mouth 2 (two) times daily.        Contact information for follow-up providers  Bonnie Mateo Larger, MD. Schedule an appointment as soon as possible for a visit in 2 week(s).   Specialties: Nephrology, Internal Medicine Contact information: [address] [place] [address] [phone]              Contact information for after-discharge care     Destination     [facility], [place] .   Service: Skilled Nursing Contact information: [address] [place]  [phone]                    Allergies  Allergen Reactions   Lisinopril  Hives    Cough, hives, and lip edema    Nsaids Other (See Comments)    Abnormal renal function   Penicillins Rash and Other (See Comments)    Pt has taken Keflex without difficulty  Has patient had a PCN reaction causing immediate rash, facial/tongue/throat swelling, SOB or lightheadedness with hypotension: yes  Has patient had a PCN reaction causing severe rash  involving mucus membranes or skin necrosis: no  Has patient had a PCN reaction that required hospitalization no  Has patient had a PCN reaction occurring within the last 10 years: no  If all of the above answers are NO, then may proceed with Cephalosporin use.    Consultations:    Procedures/Studies: CT ABDOMEN PELVIS WO CONTRAST Result Date: 06/21/2024 CLINICAL DATA:  Abdominal/flank pain.  Possible renal stone. EXAM: CT ABDOMEN AND PELVIS WITHOUT CONTRAST TECHNIQUE: Multidetector CT imaging of the abdomen and pelvis was performed following the standard protocol without IV contrast. RADIATION DOSE REDUCTION: This exam was performed according to the departmental dose-optimization program which includes automated exposure control, adjustment of the mA and/or kV according to patient size and/or use of iterative reconstruction technique. COMPARISON:  06/16/2024, 08/30/2023, MRI abdomen 10/25/2023 FINDINGS: Lower chest: Mild cardiomegaly. Calcified plaque over the right coronary artery. Calcified plaque over the descending thoracic aorta. Tiny amount of left pleural fluid/atelectasis. Hepatobiliary: Previous cholecystectomy. Liver and biliary tree are unremarkable. Pancreas: Few small stable cystic areas over the body and tail of the pancreas. Spleen: Normal. Adrenals/Urinary Tract: Adrenal glands are normal. Kidneys are normal size without hydronephrosis or nephrolithiasis. Ureters and bladder are normal. Stomach/Bowel: Stomach and small bowel are unremarkable. Appendix contains an appendicolith and is otherwise unremarkable. Moderate fecal retention over the rectum. Vascular/Lymphatic: Mild-to-moderate calcified plaque over the abdominal aorta which is normal in caliber. No significant adenopathy. Reproductive: Uterus and bilateral adnexa are unremarkable. Other: Small umbilical hernia which now contains a short segment of small bowel without evidence of small-bowel obstruction. Multiple stable cystic  areas within the mesentery. Musculoskeletal: Significant degenerative changes of the spine with multilevel disc disease over the lower thoracic and lumbar spine. Minimal retrolisthesis of T12 on L1 and subtle grade 1 anterolisthesis of L3 on L4 as well as L4 on L5 unchanged. IMPRESSION: 1. No acute findings in the abdomen/pelvis. No hydronephrosis or nephrolithiasis. 2. Small umbilical hernia containing a short segment of small bowel without evidence of obstruction. 3. Moderate fecal retention over the rectum. 4. Few small stable cystic areas over the body and tail of the pancreas. No further follow-up recommended. 5. Multiple stable cystic areas within the mesentery. 6. Aortic atherosclerosis. Atherosclerotic coronary artery disease. 7. Significant degenerative changes of the spine with multilevel disc disease over the lower thoracic and lumbar spine. Aortic Atherosclerosis (ICD10-I70.0). Electronically Signed   By: Toribio Agreste M.D.   On: 06/21/2024 12:00   DG CHEST PORT 1 VIEW  Result Date: 06/18/2024 CLINICAL DATA:  Shortness of breath EXAM: PORTABLE CHEST 1 VIEW COMPARISON:  Chest x-ray 06/16/2024 FINDINGS: The heart is enlarged. Patient is status post TAVR. There are increased central interstitial markings throughout both lungs. There is no lung consolidation, pleural effusion or pneumothorax. Bilateral shoulder arthroplasties are present. There are atherosclerotic calcifications of the aorta. IMPRESSION: Cardiomegaly with increased central interstitial markings throughout both lungs, likely pulmonary edema. Electronically Signed   By: Greig Pique M.D.   On: 06/18/2024 15:57   CT Renal Stone Study Result Date: 06/16/2024 CLINICAL DATA:  Fever, back pain and hematuria. EXAM: CT ABDOMEN AND PELVIS WITHOUT CONTRAST TECHNIQUE: Multidetector CT imaging of the abdomen and pelvis was performed following the standard protocol without IV contrast. RADIATION DOSE REDUCTION: This exam was performed according to  the departmental dose-optimization program which includes automated exposure control, adjustment of the mA and/or kV according to patient size and/or use of iterative reconstruction technique. COMPARISON:  CTA abdomen and pelvis 08/30/2023. MRI abdomen 10/25/2023. FINDINGS: Lower chest: No acute abnormality. Hepatobiliary: No focal liver abnormality is seen. Status post cholecystectomy. No biliary dilatation. Pancreas: There are multiple cystic areas in the pancreas measuring up to 13 mm. These were more well defined on the prior study, but appear grossly unchanged. No acute inflammation identified. Spleen: Normal in size without focal abnormality. Adrenals/Urinary Tract: Adrenal glands are unremarkable. Kidneys are normal, without renal calculi, focal lesion, or hydronephrosis. Bladder is unremarkable. Stomach/Bowel: Stomach is within normal limits. There is a small hiatal hernia. Appendix appears normal. No evidence of bowel wall thickening, distention, or inflammatory changes. Vascular/Lymphatic: Aortic atherosclerosis. No enlarged abdominal or pelvic lymph nodes. Reproductive: Calcified uterine fibroids are present measuring up to 14 mm. Adnexa are within normal limits. Other: There is no ascites or free air. There is a small fat containing umbilical hernia. Fall circumscribed low-density areas in the mesentery are again seen in the central abdomen and left lower quadrant measuring up to 2.6 x 1.6 cm. These appear cystic and unchanged. Musculoskeletal: Severe degenerative changes affect the spine. IMPRESSION: 1. No acute localizing process in the abdomen or pelvis. 2. Stable cystic areas in the pancreas and mesentery. 3. Stable uterine fibroids. 4. Aortic atherosclerosis. Aortic Atherosclerosis (ICD10-I70.0). Electronically Signed   By: Greig Pique M.D.   On: 06/16/2024 21:32   DG Chest 1 View Result Date: 06/16/2024 CLINICAL DATA:  Fever.  Wheezing. EXAM: CHEST  1 VIEW COMPARISON:  Chest radiograph dated  10/01/2023 FINDINGS: A shallow inspiration. No focal consolidation, pleural effusion or pneumothorax. Mild cardiomegaly. Aortic valve repair. No acute osseous pathology. Bilateral shoulder arthroplasties. IMPRESSION: 1. No active disease. 2. Mild cardiomegaly. Electronically Signed   By: Vanetta Chou M.D.   On: 06/16/2024 17:45   DG Lumbar Spine Complete Result Date: 06/16/2024 CLINICAL DATA:  low back pain 2 weeks, worse over the last 1 day. EXAM: LUMBAR SPINE - COMPLETE 4+ VIEW COMPARISON:  None Available. FINDINGS: There are 6 nonrib-bearing lumbar vertebrae. There is exaggerated lumbar lordosis. There is grade 1 anterolisthesis of L5 over S1 and L3 over L4. There is also grade 1 retrolisthesis of L2 over L3. no spondylolysis. Vertebral body heights are maintained. No aggressive osseous lesion. Moderate multilevel degenerative changes in the form of reduced intervertebral disc height, endplate sclerosis/irregularity, facet arthropathy and marginal osteophyte formation. Sacroiliac joints are symmetric. Visualized soft tissues are within normal limits. IMPRESSION: No acute osseous abnormality of the lumbar spine. Moderate multilevel degenerative changes. Electronically Signed   By: Ree  Kimberlee M.D.   On: 06/16/2024 14:09      Subjective: Patient seen and examined at bedside today.  Comfortably sitting on the bed.  She is afebrile.  On room air.  Denies any shortness of breath or cough.  Her left flank pain is significantly better today.  Abdomen soft and nondistended.  Had a bowel movement yesterday.  Medically stable for discharge to SNF today.  I called her son for update about discharge, call is not received  Discharge Exam: Vitals:   06/22/24 0503 06/22/24 0801  BP: 124/77 (!) 124/56  Pulse: 79 72  Resp: 18   Temp: 98 F (36.7 C) 97.7 F (36.5 C)  SpO2: 100% 97%   Vitals:   06/22/24 0023 06/22/24 0500 06/22/24 0503 06/22/24 0801  BP: (!) 106/58  124/77 (!) 124/56  Pulse: 70  79  72  Resp: 18  18   Temp: 97.9 F (36.6 C)  98 F (36.7 C) 97.7 F (36.5 C)  TempSrc: Oral  Oral   SpO2: 95%  100% 97%  Weight:  84.9 kg    Height:        General: Pt is alert, awake, not in acute distress, pleasant elderly female Cardiovascular: RRR, S1/S2 +, no rubs, no gallops Respiratory: CTA bilaterally, no wheezing, no rhonchi Abdominal: Soft, NT, ND, bowel sounds + Extremities: no edema, no cyanosis    The results of significant diagnostics from this hospitalization (including imaging, microbiology, ancillary and laboratory) are listed below for reference.     Microbiology: Recent Results (from the past 240 hours)  Resp panel by RT-PCR (RSV, Flu A&B, Covid) Anterior Nasal Swab     Status: None   Collection Time: 06/16/24  4:36 PM   Specimen: Anterior Nasal Swab  Result Value Ref Range Status   SARS Coronavirus 2 by RT PCR NEGATIVE NEGATIVE Final   Influenza A by PCR NEGATIVE NEGATIVE Final   Influenza B by PCR NEGATIVE NEGATIVE Final    Comment: (NOTE) The Xpert Xpress SARS-CoV-2/FLU/RSV plus assay is intended as an aid in the diagnosis of influenza from Nasopharyngeal swab specimens and should not be used as a sole basis for treatment. Nasal washings and aspirates are unacceptable for Xpert Xpress SARS-CoV-2/FLU/RSV testing.  Fact Sheet for Patients: BloggerCourse.com  Fact Sheet for Healthcare Providers: SeriousBroker.it  This test is not yet approved or cleared by the [place]  FDA and has been authorized for detection and/or diagnosis of SARS-CoV-2 by FDA under an Emergency Use Authorization (EUA). This EUA will remain in effect (meaning this test can be used) for the duration of the COVID-19 declaration under Section 564(b)(1) of the Act, 21 U.S.C. section 360bbb-3(b)(1), unless the authorization is terminated or revoked.     Resp Syncytial Virus by PCR NEGATIVE NEGATIVE Final    Comment:  (NOTE) Fact Sheet for Patients: BloggerCourse.com  Fact Sheet for Healthcare Providers: SeriousBroker.it  This test is not yet approved or cleared by the [place]  FDA and has been authorized for detection and/or diagnosis of SARS-CoV-2 by FDA under an Emergency Use Authorization (EUA). This EUA will remain in effect (meaning this test can be used) for the duration of the COVID-19 declaration under Section 564(b)(1) of the Act, 21 U.S.C. section 360bbb-3(b)(1), unless the authorization is terminated or revoked.  Performed at [facility], [address], [place], [place] [address]   Urine Culture     Status: Abnormal   Collection Time: 06/16/24  4:36 PM   Specimen: Urine, Clean  Catch  Result Value Ref Range Status   Specimen Description URINE, CLEAN CATCH  Final   Special Requests   Final    NONE Performed at [facility], [address], [place], [place] [address]    Culture 90,000 COLONIES/mL ESCHERICHIA COLI (A)  Final   Report Status 06/18/2024 FINAL  Final   Organism ID, Bacteria ESCHERICHIA COLI (A)  Final      Susceptibility   Escherichia coli - MIC*    AMPICILLIN 8 SENSITIVE Sensitive     CEFAZOLIN  <=4 SENSITIVE Sensitive     CEFEPIME  <=0.12 SENSITIVE Sensitive     CEFTRIAXONE  <=0.25 SENSITIVE Sensitive     CIPROFLOXACIN <=0.25 SENSITIVE Sensitive     GENTAMICIN <=1 SENSITIVE Sensitive     IMIPENEM <=0.25 SENSITIVE Sensitive     NITROFURANTOIN <=16 SENSITIVE Sensitive     TRIMETH/SULFA <=20 SENSITIVE Sensitive     AMPICILLIN/SULBACTAM 4 SENSITIVE Sensitive     PIP/TAZO <=4 SENSITIVE Sensitive ug/mL    * 90,000 COLONIES/mL ESCHERICHIA COLI  Blood culture (routine x 2)     Status: Abnormal   Collection Time: 06/16/24  4:45 PM   Specimen: BLOOD  Result Value Ref Range Status   Specimen Description BLOOD LEFT ANTECUBITAL  Final   Special Requests   Final    BOTTLES DRAWN AEROBIC AND ANAEROBIC Blood  Culture results may not be optimal due to an inadequate volume of blood received in culture bottles   Culture  Setup Time   Final    GRAM NEGATIVE RODS IN BOTH AEROBIC AND ANAEROBIC BOTTLES CRITICAL VALUE NOTED.  VALUE IS CONSISTENT WITH PREVIOUSLY REPORTED AND CALLED VALUE.    Culture (A)  Final    ESCHERICHIA COLI SUSCEPTIBILITIES PERFORMED ON PREVIOUS CULTURE WITHIN THE LAST 5 DAYS. Performed at [facility], [address], [place], [place] [address]    Report Status 06/19/2024 FINAL  Final  Blood culture (routine x 2)     Status: Abnormal   Collection Time: 06/16/24  6:00 PM   Specimen: BLOOD  Result Value Ref Range Status   Specimen Description BLOOD SITE NOT SPECIFIED  Final   Special Requests   Final    BOTTLES DRAWN AEROBIC AND ANAEROBIC Blood Culture adequate volume   Culture  Setup Time   Final    GRAM NEGATIVE RODS IN BOTH AEROBIC AND ANAEROBIC BOTTLES CRITICAL RESULT CALLED TO, READ BACK BY AND VERIFIED WITH: [facility] JADE DENNINGER [id number] BY EC Performed at [facility], [address], [place], [place] [address]    Culture ESCHERICHIA COLI (A)  Final   Report Status 06/19/2024 FINAL  Final   Organism ID, Bacteria ESCHERICHIA COLI  Final   Organism ID, Bacteria ESCHERICHIA COLI  Final      Susceptibility   Escherichia coli - KIRBY BAUER*    CEFAZOLIN  SENSITIVE Sensitive    Escherichia coli - MIC*    AMPICILLIN 8 SENSITIVE Sensitive     CEFEPIME  <=0.12 SENSITIVE Sensitive     CEFTAZIDIME <=1 SENSITIVE Sensitive     CEFTRIAXONE  <=0.25 SENSITIVE Sensitive     CIPROFLOXACIN <=0.25 SENSITIVE Sensitive     GENTAMICIN <=1 SENSITIVE Sensitive     IMIPENEM 0.5 SENSITIVE Sensitive     TRIMETH/SULFA <=20 SENSITIVE Sensitive     AMPICILLIN/SULBACTAM 4 SENSITIVE Sensitive     PIP/TAZO 32 INTERMEDIATE Intermediate ug/mL    * ESCHERICHIA COLI    ESCHERICHIA COLI  Blood Culture ID Panel (Reflexed)  Status: Abnormal   Collection Time: 06/16/24  6:00 PM   Result Value Ref Range Status   Enterococcus faecalis NOT DETECTED NOT DETECTED Final   Enterococcus Faecium NOT DETECTED NOT DETECTED Final   Listeria monocytogenes NOT DETECTED NOT DETECTED Final   Staphylococcus species NOT DETECTED NOT DETECTED Final   Staphylococcus aureus (BCID) NOT DETECTED NOT DETECTED Final   Staphylococcus epidermidis NOT DETECTED NOT DETECTED Final   Staphylococcus lugdunensis NOT DETECTED NOT DETECTED Final   Streptococcus species NOT DETECTED NOT DETECTED Final   Streptococcus agalactiae NOT DETECTED NOT DETECTED Final   Streptococcus pneumoniae NOT DETECTED NOT DETECTED Final   Streptococcus pyogenes NOT DETECTED NOT DETECTED Final   A.calcoaceticus-baumannii NOT DETECTED NOT DETECTED Final   Bacteroides fragilis NOT DETECTED NOT DETECTED Final   Enterobacterales DETECTED (A) NOT DETECTED Final    Comment: Enterobacterales represent a large order of gram negative bacteria, not a single organism. CRITICAL RESULT CALLED TO, READ BACK BY AND VERIFIED WITH: PHARMD JADE DENNINGER [id number] AT 1025 BY EC    Enterobacter cloacae complex NOT DETECTED NOT DETECTED Final   Escherichia coli DETECTED (A) NOT DETECTED Final    Comment: CRITICAL RESULT CALLED TO, READ BACK BY AND VERIFIED WITH: PHARMD JADE DENNINGER [id number] AT 1025 BY EC    Klebsiella aerogenes NOT DETECTED NOT DETECTED Final   Klebsiella oxytoca NOT DETECTED NOT DETECTED Final   Klebsiella pneumoniae NOT DETECTED NOT DETECTED Final   Proteus species NOT DETECTED NOT DETECTED Final   Salmonella species NOT DETECTED NOT DETECTED Final   Serratia marcescens NOT DETECTED NOT DETECTED Final   Haemophilus influenzae NOT DETECTED NOT DETECTED Final   Neisseria meningitidis NOT DETECTED NOT DETECTED Final   Pseudomonas aeruginosa NOT DETECTED NOT DETECTED Final   Stenotrophomonas maltophilia NOT DETECTED NOT DETECTED Final   Candida albicans NOT DETECTED NOT DETECTED Final   Candida auris NOT DETECTED  NOT DETECTED Final   Candida glabrata NOT DETECTED NOT DETECTED Final   Candida krusei NOT DETECTED NOT DETECTED Final   Candida parapsilosis NOT DETECTED NOT DETECTED Final   Candida tropicalis NOT DETECTED NOT DETECTED Final   Cryptococcus neoformans/gattii NOT DETECTED NOT DETECTED Final   CTX-M ESBL NOT DETECTED NOT DETECTED Final   Carbapenem resistance IMP NOT DETECTED NOT DETECTED Final   Carbapenem resistance KPC NOT DETECTED NOT DETECTED Final   Carbapenem resistance NDM NOT DETECTED NOT DETECTED Final   Carbapenem resist OXA 48 LIKE NOT DETECTED NOT DETECTED Final   Carbapenem resistance VIM NOT DETECTED NOT DETECTED Final    Comment: Performed at [facility], [address], [place], [place] [address]     Labs: BNP (last 3 results) Recent Labs    06/17/24 0221  BNP 894.9*   Basic Metabolic Panel: Recent Labs  Lab 06/17/24 0221 06/18/24 1012 06/19/24 0420 06/20/24 0404 06/21/24 0241 06/22/24 0221  NA 133* 132* 131* 132* 131* 134*  K 4.2 3.8 3.7 3.6 3.6 3.7  CL 99 100 95* 95* 97* 99  CO2 20* 23 23 24 23 25   GLUCOSE 244* 235* 261* 226* 120* 142*  BUN 33* 47* 52* 51* 50* 45*  CREATININE 1.59* 2.60* 2.47* 2.11* 2.07* 2.09*  CALCIUM  9.7 9.1 9.5 9.2 9.2 9.2  MG 1.6*  --  2.2  --   --   --    Liver Function Tests: Recent Labs  Lab 06/17/24 0221  AST 94*  ALT 43  ALKPHOS 71  BILITOT 1.0  PROT 7.7  ALBUMIN  3.0*   No results for input(s): LIPASE, AMYLASE in the last 168 hours. No results for input(s): AMMONIA in the last 168 hours. CBC: Recent Labs  Lab 06/16/24 1645 06/17/24 0221 06/18/24 0307 06/19/24 0420 06/22/24 0221  WBC 16.3* 18.6* 13.2* 11.1* 12.7*  NEUTROABS 14.1* 16.5*  --   --   --   HGB 11.3* 12.0 10.7* 10.3* 9.8*  HCT 33.7* 35.8* 32.4* 30.1* 28.7*  MCV 95.5 96.2 97.0 93.2 93.2  PLT 124* 114* 92* 100* 165   Cardiac Enzymes: No results for input(s): CKTOTAL, CKMB, CKMBINDEX, TROPONINI in the last 168  hours. BNP: Invalid input(s): POCBNP CBG: Recent Labs  Lab 06/21/24 0814 06/21/24 1210 06/21/24 1708 06/21/24 2029 06/22/24 0803  GLUCAP 141* 207* 126* 134* 161*   D-Dimer No results for input(s): DDIMER in the last 72 hours. Hgb A1c No results for input(s): HGBA1C in the last 72 hours. Lipid Profile No results for input(s): CHOL, HDL, LDLCALC, TRIG, CHOLHDL, LDLDIRECT in the last 72 hours. Thyroid  function studies No results for input(s): TSH, T4TOTAL, T3FREE, THYROIDAB in the last 72 hours.  Invalid input(s): FREET3 Anemia work up No results for input(s): VITAMINB12, FOLATE, FERRITIN, TIBC, IRON, RETICCTPCT in the last 72 hours. Urinalysis    Component Value Date/Time   COLORURINE YELLOW 06/16/2024 1636   APPEARANCEUR HAZY (A) 06/16/2024 1636   LABSPEC 1.016 06/16/2024 1636   PHURINE 5.0 06/16/2024 1636   GLUCOSEU 50 (A) 06/16/2024 1636   HGBUR LARGE (A) 06/16/2024 1636   BILIRUBINUR NEGATIVE 06/16/2024 1636   BILIRUBINUR n 09/15/2012 1240   KETONESUR NEGATIVE 06/16/2024 1636   PROTEINUR 100 (A) 06/16/2024 1636   UROBILINOGEN 0.2 09/15/2012 1240   UROBILINOGEN 0.2 10/21/2010 1025   NITRITE NEGATIVE 06/16/2024 1636   LEUKOCYTESUR SMALL (A) 06/16/2024 1636   Sepsis Labs Recent Labs  Lab 06/17/24 0221 06/18/24 0307 06/19/24 0420 06/22/24 0221  WBC 18.6* 13.2* 11.1* 12.7*   Microbiology Recent Results (from the past 240 hours)  Resp panel by RT-PCR (RSV, Flu A&B, Covid) Anterior Nasal Swab     Status: None   Collection Time: 06/16/24  4:36 PM   Specimen: Anterior Nasal Swab  Result Value Ref Range Status   SARS Coronavirus 2 by RT PCR NEGATIVE NEGATIVE Final   Influenza A by PCR NEGATIVE NEGATIVE Final   Influenza B by PCR NEGATIVE NEGATIVE Final    Comment: (NOTE) The Xpert Xpress SARS-CoV-2/FLU/RSV plus assay is intended as an aid in the diagnosis of influenza from Nasopharyngeal swab specimens and should not be  used as a sole basis for treatment. Nasal washings and aspirates are unacceptable for Xpert Xpress SARS-CoV-2/FLU/RSV testing.  Fact Sheet for Patients: BloggerCourse.com  Fact Sheet for Healthcare Providers: SeriousBroker.it  This test is not yet approved or cleared by the [place]  FDA and has been authorized for detection and/or diagnosis of SARS-CoV-2 by FDA under an Emergency Use Authorization (EUA). This EUA will remain in effect (meaning this test can be used) for the duration of the COVID-19 declaration under Section 564(b)(1) of the Act, 21 U.S.C. section 360bbb-3(b)(1), unless the authorization is terminated or revoked.     Resp Syncytial Virus by PCR NEGATIVE NEGATIVE Final    Comment: (NOTE) Fact Sheet for Patients: BloggerCourse.com  Fact Sheet for Healthcare Providers: SeriousBroker.it  This test is not yet approved or cleared by the [place]  FDA and has been authorized for detection and/or diagnosis of SARS-CoV-2 by FDA under an Emergency Use Authorization (EUA). This EUA will remain  in effect (meaning this test can be used) for the duration of the COVID-19 declaration under Section 564(b)(1) of the Act, 21 U.S.C. section 360bbb-3(b)(1), unless the authorization is terminated or revoked.  Performed at [facility], [address] [place], [place] [address]   Urine Culture     Status: Abnormal   Collection Time: 06/16/24  4:36 PM   Specimen: Urine, Clean Catch  Result Value Ref Range Status   Specimen Description URINE, CLEAN CATCH  Final   Special Requests   Final    NONE Performed at [facility], [address], [place], [place] [address]    Culture 90,000 COLONIES/mL ESCHERICHIA COLI (A)  Final   Report Status 06/18/2024 FINAL  Final   Organism ID, Bacteria ESCHERICHIA COLI (A)  Final      Susceptibility   Escherichia coli - MIC*     AMPICILLIN 8 SENSITIVE Sensitive     CEFAZOLIN  <=4 SENSITIVE Sensitive     CEFEPIME  <=0.12 SENSITIVE Sensitive     CEFTRIAXONE  <=0.25 SENSITIVE Sensitive     CIPROFLOXACIN <=0.25 SENSITIVE Sensitive     GENTAMICIN <=1 SENSITIVE Sensitive     IMIPENEM <=0.25 SENSITIVE Sensitive     NITROFURANTOIN <=16 SENSITIVE Sensitive     TRIMETH/SULFA <=20 SENSITIVE Sensitive     AMPICILLIN/SULBACTAM 4 SENSITIVE Sensitive     PIP/TAZO <=4 SENSITIVE Sensitive ug/mL    * 90,000 COLONIES/mL ESCHERICHIA COLI  Blood culture (routine x 2)     Status: Abnormal   Collection Time: 06/16/24  4:45 PM   Specimen: BLOOD  Result Value Ref Range Status   Specimen Description BLOOD LEFT ANTECUBITAL  Final   Special Requests   Final    BOTTLES DRAWN AEROBIC AND ANAEROBIC Blood Culture results may not be optimal due to an inadequate volume of blood received in culture bottles   Culture  Setup Time   Final    GRAM NEGATIVE RODS IN BOTH AEROBIC AND ANAEROBIC BOTTLES CRITICAL VALUE NOTED.  VALUE IS CONSISTENT WITH PREVIOUSLY REPORTED AND CALLED VALUE.    Culture (A)  Final    ESCHERICHIA COLI SUSCEPTIBILITIES PERFORMED ON PREVIOUS CULTURE WITHIN THE LAST 5 DAYS. Performed at [facility], [address] [place], [place] [address]    Report Status 06/19/2024 FINAL  Final  Blood culture (routine x 2)     Status: Abnormal   Collection Time: 06/16/24  6:00 PM   Specimen: BLOOD  Result Value Ref Range Status   Specimen Description BLOOD SITE NOT SPECIFIED  Final   Special Requests   Final    BOTTLES DRAWN AEROBIC AND ANAEROBIC Blood Culture adequate volume   Culture  Setup Time   Final    GRAM NEGATIVE RODS IN BOTH AEROBIC AND ANAEROBIC BOTTLES CRITICAL RESULT CALLED TO, READ BACK BY AND VERIFIED WITH: [facility] JADE DENNINGER [id number] BY EC Performed at [facility], [address], [place], [place] [address]    Culture ESCHERICHIA COLI (A)  Final   Report Status 06/19/2024 FINAL  Final    Organism ID, Bacteria ESCHERICHIA COLI  Final   Organism ID, Bacteria ESCHERICHIA COLI  Final      Susceptibility   Escherichia coli - KIRBY BAUER*    CEFAZOLIN  SENSITIVE Sensitive    Escherichia coli - MIC*    AMPICILLIN 8 SENSITIVE Sensitive     CEFEPIME  <=0.12 SENSITIVE Sensitive     CEFTAZIDIME <=1 SENSITIVE Sensitive     CEFTRIAXONE  <=0.25 SENSITIVE Sensitive  CIPROFLOXACIN <=0.25 SENSITIVE Sensitive     GENTAMICIN <=1 SENSITIVE Sensitive     IMIPENEM 0.5 SENSITIVE Sensitive     TRIMETH/SULFA <=20 SENSITIVE Sensitive     AMPICILLIN/SULBACTAM 4 SENSITIVE Sensitive     PIP/TAZO 32 INTERMEDIATE Intermediate ug/mL    * ESCHERICHIA COLI    ESCHERICHIA COLI  Blood Culture ID Panel (Reflexed)     Status: Abnormal   Collection Time: 06/16/24  6:00 PM  Result Value Ref Range Status   Enterococcus faecalis NOT DETECTED NOT DETECTED Final   Enterococcus Faecium NOT DETECTED NOT DETECTED Final   Listeria monocytogenes NOT DETECTED NOT DETECTED Final   Staphylococcus species NOT DETECTED NOT DETECTED Final   Staphylococcus aureus (BCID) NOT DETECTED NOT DETECTED Final   Staphylococcus epidermidis NOT DETECTED NOT DETECTED Final   Staphylococcus lugdunensis NOT DETECTED NOT DETECTED Final   Streptococcus species NOT DETECTED NOT DETECTED Final   Streptococcus agalactiae NOT DETECTED NOT DETECTED Final   Streptococcus pneumoniae NOT DETECTED NOT DETECTED Final   Streptococcus pyogenes NOT DETECTED NOT DETECTED Final   A.calcoaceticus-baumannii NOT DETECTED NOT DETECTED Final   Bacteroides fragilis NOT DETECTED NOT DETECTED Final   Enterobacterales DETECTED (A) NOT DETECTED Final    Comment: Enterobacterales represent a large order of gram negative bacteria, not a single organism. CRITICAL RESULT CALLED TO, READ BACK BY AND VERIFIED WITH: [facility]AR[facility] JADE DENNINGER [id number] AT 1025 BY EC    Enterobacter cloacae complex NOT DETECTED NOT DETECTED Final   Escherichia coli DETECTED (A) NOT  DETECTED Final    Comment: CRITICAL RESULT CALLED TO, READ BACK BY AND VERIFIED WITH: PHARMD JADE DENNINGER [id number] AT 1025 BY EC    Klebsiella aerogenes NOT DETECTED NOT DETECTED Final   Klebsiella oxytoca NOT DETECTED NOT DETECTED Final   Klebsiella pneumoniae NOT DETECTED NOT DETECTED Final   Proteus species NOT DETECTED NOT DETECTED Final   Salmonella species NOT DETECTED NOT DETECTED Final   Serratia marcescens NOT DETECTED NOT DETECTED Final   Haemophilus influenzae NOT DETECTED NOT DETECTED Final   Neisseria meningitidis NOT DETECTED NOT DETECTED Final   Pseudomonas aeruginosa NOT DETECTED NOT DETECTED Final   Stenotrophomonas maltophilia NOT DETECTED NOT DETECTED Final   Candida albicans NOT DETECTED NOT DETECTED Final   Candida auris NOT DETECTED NOT DETECTED Final   Candida glabrata NOT DETECTED NOT DETECTED Final   Candida krusei NOT DETECTED NOT DETECTED Final   Candida parapsilosis NOT DETECTED NOT DETECTED Final   Candida tropicalis NOT DETECTED NOT DETECTED Final   Cryptococcus neoformans/gattii NOT DETECTED NOT DETECTED Final   CTX-M ESBL NOT DETECTED NOT DETECTED Final   Carbapenem resistance IMP NOT DETECTED NOT DETECTED Final   Carbapenem resistance KPC NOT DETECTED NOT DETECTED Final   Carbapenem resistance NDM NOT DETECTED NOT DETECTED Final   Carbapenem resist OXA 48 LIKE NOT DETECTED NOT DETECTED Final   Carbapenem resistance VIM NOT DETECTED NOT DETECTED Final    Comment: Performed at [facility], [address], [place], [place] [address]    Please note: You were cared for by a hospitalist during your hospital stay. Once you are discharged, your primary care physician will handle any further medical issues. Please note that NO REFILLS for any discharge medications will be authorized once you are discharged, as it is imperative that you return to your primary care physician (or establish a relationship with a primary care physician if you do not have  one) for your post hospital discharge needs so that they  can reassess your need for medications and monitor your lab values.    Time coordinating discharge: 40 minutes  SIGNED:   Ivonne Mustache, MD  [facility] 06/22/2024, 10:41 AM Pager [phone]  If 7PM-7AM, please contact night-coverage [email or web address] Password [email or web address]

## 2024-06-22 NOTE — Progress Notes (Signed)
 Occupational Therapy Treatment Patient Details Name: Bonnie Chavez MRN: [id number] DOB: Jul 28, 1934 Today's Date: 06/22/2024   History of present illness Pt is an 89 yo female presenting to [facility] ED on 06/16/24 with back pain and generalized weakness, further workup for sepsis. Imaging demonstrated no acute spinal abnormalities but does display moderate degenerative changes. PMH of chronic diastolic heart failure, CKD 3B, type 2 diabetes mellitus, essential pretension, hyperlipidemia, acquired hypothyroidism, severe aortic stenosis status post TAVR in November 2024.   OT comments  Educated on proper posture with feeding as pt noted to bed slumped down in bed trying to eat, transferred pt to recliner with mod A HHA and cues for balance/feet positioning. Pt positioned properly in an upright trunk position to allow for proper swallow reflex. OT to continue to progress pt as able. DC plans remain appropriate for SNF.      If plan is discharge home, recommend the following:  A little help with walking and/or transfers;A lot of help with bathing/dressing/bathroom;Assist for transportation;Assistance with cooking/housework   Equipment Recommendations  Other (comment) (Pt reports having rec DME)    Recommendations for Other Services      Precautions / Restrictions Precautions Precautions: Fall Precaution/Restrictions Comments: back precautions for comfort. Restrictions Weight Bearing Restrictions Per Provider Order: No       Mobility Bed Mobility Overal bed mobility: Needs Assistance Bed Mobility: Rolling, Sidelying to Sit Rolling: Min assist Sidelying to sit: Mod assist       General bed mobility comments: mod A to raise trunk, min A to roll with increased time.    Transfers Overall transfer level: Needs assistance Equipment used: 1 person hand held assist Transfers: Sit to/from Stand, Bed to chair/wheelchair/BSC Sit to Stand: Mod assist     Step pivot transfers: Mod assist      General transfer comment: mod A to boost into standing, min cues needed to sequence steps during transfer so that pt maintains balance while turning feet.     Balance Overall balance assessment: Needs assistance Sitting-balance support: Feet supported Sitting balance-Leahy Scale: Fair     Standing balance support: Single extremity supported, Bilateral upper extremity supported, During functional activity Standing balance-Leahy Scale: Poor Standing balance comment: Reliant on ext assist.                           ADL either performed or assessed with clinical judgement   ADL   Eating/Feeding: Set up;Sitting Eating/Feeding Details (indicate cue type and reason): open containers                                   General ADL Comments: Assisted pt with transfer to recliner in prep for eating lunch    Extremity/Trunk Assessment              Vision       Perception     Praxis     Communication Communication Communication: No apparent difficulties   Cognition Arousal: Alert Behavior During Therapy: WFL for tasks assessed/performed Cognition: No apparent impairments                               Following commands: Intact        Cueing   Cueing Techniques: Verbal cues  Exercises      Shoulder Instructions  General Comments VSS    Pertinent Vitals/ Pain       Pain Assessment Pain Assessment: Faces Faces Pain Scale: Hurts little more Pain Location: back Pain Descriptors / Indicators: Aching, Discomfort, Grimacing Pain Intervention(s): Monitored during session, Limited activity within patient's tolerance, Repositioned  Home Living                                          Prior Functioning/Environment              Frequency  Min 2X/week        Progress Toward Goals  OT Goals(current goals can now be found in the care plan section)  Progress towards OT goals: Progressing toward  goals  Acute Rehab OT Goals Patient Stated Goal: less back pain OT Goal Formulation: With patient Time For Goal Achievement: 07/01/24 Potential to Achieve Goals: Good  Plan      Co-evaluation                 AM-PAC OT 6 Clicks Daily Activity     Outcome Measure   Help from another person eating meals?: A Little Help from another person taking care of personal grooming?: A Little Help from another person toileting, which includes using toliet, bedpan, or urinal?: A Lot Help from another person bathing (including washing, rinsing, drying)?: A Lot Help from another person to put on and taking off regular upper body clothing?: A Little Help from another person to put on and taking off regular lower body clothing?: A Lot 6 Click Score: 15    End of Session Equipment Utilized During Treatment: Gait belt  OT Visit Diagnosis: Unsteadiness on feet (R26.81);Other abnormalities of gait and mobility (R26.89);Pain;Muscle weakness (generalized) (M62.81) Pain - part of body:  (back)   Activity Tolerance Patient tolerated treatment well   Patient Left in chair;with call bell/phone within reach;with chair alarm set   Nurse Communication Mobility status        Time: 8783-8768 OT Time Calculation (min): 15 min  Charges: OT General Charges $OT Visit: 1 Visit OT Treatments $Therapeutic Activity: 8-22 mins  06/22/2024  AB, OTR/L  Acute Rehabilitation Services  Office: [phone]   Curtistine JONETTA Das 06/22/2024, 1:28 PM

## 2024-06-22 NOTE — TOC Transition Note (Addendum)
 Transition of Care [facility]) - Discharge Note   Patient Details  Name: Bonnie Chavez MRN: [id number] Date of Birth: September 21, 1934  [facility] of [facility]) CM/SW Contact:  Zauria Dombek A [place], LCSW Phone Number: 06/22/2024, 10:45 AM   Clinical Narrative:     Patient will DC to: [facility]  Anticipated DC date: 06/22/24  Family notified: [name] Hacker  Transport by: [facility]  Approval Dates:  7/30 - 8/5   Rzmu#[id number]     Per MD patient ready for DC to [facility]. RN, patient, patient's family, and facility notified of DC. Discharge Summary and FL2 sent to facility. RN to call report prior to discharge [phone]). DC packet on chart. Ambulance transport requested for patient.     CSW will sign off for now as social work intervention is no longer needed. Please consult us  again if new needs arise.   Final next level of care: Skilled Nursing Facility Barriers to Discharge: Barriers Resolved   Patient Goals and CMS Choice Patient states their goals for this hospitalization and ongoing recovery are:: SNF   Choice offered to / list presented to : Patient      Discharge Placement              Patient chooses bed at: [facility] [place] Patient to be transferred to facility by: [facility] Name of family member notified: Devyne Hauger Patient and family notified of of transfer: 06/22/24  Discharge Plan and Services Additional resources added to the After Visit Summary for   In-house Referral: Clinical Social Work                                   Social Drivers of Health (SDOH) Interventions SDOH Screenings   Food Insecurity: No Food Insecurity (06/17/2024)  Housing: Low Risk  (06/17/2024)  Transportation Needs: No Transportation Needs (06/17/2024)  Utilities: Not At Risk (06/17/2024)  Alcohol Screen: Low Risk  (11/06/2022)  Depression (PHQ2-9): Low Risk  (04/03/2024)  Financial Resource Strain: Low Risk  (11/06/2022)  Physical Activity: Insufficiently Active  (11/06/2022)  Social Connections: Moderately Integrated (06/17/2024)  Stress: No Stress Concern Present (11/06/2022)  Tobacco Use: Low Risk  (06/17/2024)     Readmission Risk Interventions     No data to display

## 2024-06-22 NOTE — Progress Notes (Signed)
Report given to [facility] at receiving facility

## 2024-06-29 ENCOUNTER — Ambulatory Visit: Admitting: Podiatry

## 2024-06-29 ENCOUNTER — Other Ambulatory Visit

## 2024-07-10 ENCOUNTER — Telehealth: Payer: Self-pay

## 2024-07-10 NOTE — Transitions of Care (Post Inpatient/ED Visit) (Signed)
 07/10/2024  Name: Bonnie Chavez MRN: [id number] DOB: 02/08/1934  Today's TOC FU Call Status: Today's TOC FU Call Status:: Successful TOC FU Call Completed TOC FU Call Complete Date: 07/10/24 Patient's Name and Date of Birth confirmed.  Transition Care Management Follow-up Telephone Call Date of Discharge: 07/08/24 Discharge Facility: Other [facility]) Name of Other ([facility]) Discharge Facility: [facility] Type of Discharge: Inpatient Admission Primary Inpatient Discharge Diagnosis:: heart failure How have you been since you were released from the hospital?: Better Any questions or concerns?: No  Items Reviewed: Did you receive and understand the discharge instructions provided?: Yes Medications obtained,verified, and reconciled?: Yes (Medications Reviewed) Any new allergies since your discharge?: No Dietary orders reviewed?: Yes Do you have support at home?: Yes Name of Support/Comfort Primary Source: caregiver  Medications Reviewed Today: Medications Reviewed Today     Reviewed by Emmitt Pan, LPN (Licensed Practical Nurse) on 07/10/24 at 1045  Med List Status: <None>   Medication Order Taking? Sig Documenting Provider Last Dose Status Informant  acetaminophen  (TYLENOL ) 500 MG tablet [id number] Yes Take 500 mg by mouth every 6 (six) hours as needed (Arthritis). [provider]  Active Self, Pharmacy Records  aspirin  81 MG EC tablet [id number] Yes Take 81 mg by mouth at bedtime. Swallow whole. [provider]  Active Self, Pharmacy Records  atorvastatin  (LIPITOR) 80 MG tablet [id number] Yes Take 1 tablet (80 mg total) by mouth daily. Jillian Buttery, MD  Active   cinacalcet  (SENSIPAR ) 30 MG tablet [id number] Yes Take 30 mg by mouth See admin instructions. Take one tablet by mouth Monday-Friday until discontinued by PCP. [provider]  Active Self, Pharmacy Records  feeding supplement, GLUCERNA SHAKE, (GLUCERNA SHAKE) LIQD [id number] Yes  Take 237 mLs by mouth 3 (three) times daily between meals. Jillian Buttery, MD  Active   furosemide  (LASIX ) 20 MG tablet [id number] Yes Take 40 mg by mouth every morning. Can take additional tablet if needed Luke Chiquita SAUNDERS, DO  Active Self, Pharmacy Records  HUMULIN 70/30 KWIKPEN (70-30) 100 UNIT/ML KwikPen [id number] Yes Inject 6-8 Units into the skin See admin instructions. 6 units in the AM and 8 units in the pm [provider]  Active Self, Pharmacy Records           Med Note [name], [name]   Tue Jun 01, 2023  8:43 AM)    levothyroxine  (SYNTHROID ) 50 MCG tablet [id number] Yes Take 50 mcg by mouth daily before breakfast. [provider]  Active Self, Pharmacy Records  methocarbamol  (ROBAXIN ) 500 MG tablet [id number] Yes Take 1 tablet (500 mg total) by mouth every 8 (eight) hours as needed for muscle spasms. Nafziger, Darleene, NP  Active Self, Pharmacy Records  pantoprazole  (PROTONIX ) 40 MG tablet [id number] Yes Take 1 tablet (40 mg total) by mouth daily. Jillian Buttery, MD  Active   polyethylene glycol (MIRALAX  / GLYCOLAX ) 17 g packet [id number] Yes Take 17 g by mouth daily. Jillian Buttery, MD  Active   senna-docusate (SENOKOT-S) 8.6-50 MG tablet [id number] Yes Take 1 tablet by mouth 2 (two) times daily. Jillian Buttery, MD  Active   Trolamine Salicylate (BLUE-EMU HEMP EX) [id number] Yes Apply 1 application  topically daily as needed (pain). [provider]  Active Self, Pharmacy Records            Home Care and Equipment/Supplies: Were Home Health Services Ordered?: Yes Name of Home Health Agency:: unknown Has Agency set up a time to come to your home?: No Any new equipment  or medical supplies ordered?: NA  Functional Questionnaire: Do you need assistance with bathing/showering or dressing?: Yes Do you need assistance with meal preparation?: Yes Do you need assistance with eating?: No Do you have difficulty maintaining continence: No Do you need assistance with  getting out of bed/getting out of a chair/moving?: No Do you have difficulty managing or taking your medications?: Yes  Follow up appointments reviewed: PCP Follow-up appointment confirmed?: Yes Date of PCP follow-up appointment?: 07/17/24 Follow-up Provider: [facility] Follow-up appointment confirmed?: NA Do you need transportation to your follow-up appointment?: No Do you understand care options if your condition(s) worsen?: Yes-patient verbalized understanding    SIGNATURE [name], LPN [facility] Nurse Health Advisor Direct Dial [phone]

## 2024-07-17 ENCOUNTER — Ambulatory Visit (INDEPENDENT_AMBULATORY_CARE_PROVIDER_SITE_OTHER): Admitting: Family Medicine

## 2024-07-17 ENCOUNTER — Ambulatory Visit: Payer: Self-pay | Admitting: Family Medicine

## 2024-07-17 ENCOUNTER — Encounter: Payer: Self-pay | Admitting: Family Medicine

## 2024-07-17 ENCOUNTER — Ambulatory Visit (INDEPENDENT_AMBULATORY_CARE_PROVIDER_SITE_OTHER)

## 2024-07-17 VITALS — BP 164/82 | HR 97 | Temp 98.2°F | Ht 65.0 in | Wt 175.2 lb

## 2024-07-17 DIAGNOSIS — I10 Essential (primary) hypertension: Secondary | ICD-10-CM | POA: Diagnosis not present

## 2024-07-17 DIAGNOSIS — E039 Hypothyroidism, unspecified: Secondary | ICD-10-CM

## 2024-07-17 DIAGNOSIS — R051 Acute cough: Secondary | ICD-10-CM | POA: Diagnosis not present

## 2024-07-17 DIAGNOSIS — M62830 Muscle spasm of back: Secondary | ICD-10-CM | POA: Diagnosis not present

## 2024-07-17 DIAGNOSIS — I5032 Chronic diastolic (congestive) heart failure: Secondary | ICD-10-CM

## 2024-07-17 DIAGNOSIS — Z794 Long term (current) use of insulin: Secondary | ICD-10-CM

## 2024-07-17 DIAGNOSIS — E1122 Type 2 diabetes mellitus with diabetic chronic kidney disease: Secondary | ICD-10-CM

## 2024-07-17 DIAGNOSIS — N1832 Chronic kidney disease, stage 3b: Secondary | ICD-10-CM

## 2024-07-17 MED ORDER — METHOCARBAMOL 750 MG PO TABS: 750.0000 mg | ORAL_TABLET | Freq: Three times a day (TID) | ORAL | 1 refills | Status: DC | PRN

## 2024-07-17 MED ORDER — NEBIVOLOL HCL 5 MG PO TABS
5.0000 mg | ORAL_TABLET | Freq: Every day | ORAL | 3 refills | Status: AC
Start: 2024-07-17 — End: ?

## 2024-07-17 NOTE — Progress Notes (Unsigned)
 Established Patient Office Visit   Subjective  Patient ID: Bonnie Chavez, female    DOB: 1934/02/10  Age: 89 y.o. MRN: [id number]  Chief Complaint  Patient presents with  . Hospitalization Follow-up    Patient came in today for Hospital follow-up Seen 7/25 for sepsis UTI and Acute Cystitis,  Patient is having lower left side back pain rate of pain 7 out of 10, and a cough that started a week ago    Pt accompanied by her son. Pt is an 89 year old female with pmh sig for chronic diastolic CHF, CKD stage IIIb with baseline creatinine of 1.4-1.7, type 2 diabetes, hypertension, hyperlipidemia hypothyroidism, severe aortic stenosis status post TAVR in November 2024 who presented for HFU.  Pt hospitalized 7/25-7/31/25 urosepsis.  On presentation to ED, she was febrile with a stable blood pressure.  Lab work showed sodium of 131, potassium of 3.2, creatinine of 1.3, WBC count of 16, lactic acid of 2.7.  UA suspicious for UTI.  Blood culture, urine culture sent,started on ceftriaxone , IV fluid.   Blood cultures /urine culture showed E. coli.  Sepsis physiology has resolved.  Abx changed to oral.PT is recommending SNF on discharge.  Medically stable for discharge today.    Patient hospitalized 7/25-for uro-sepsis.  Nebivolol  and Norvasc  held due to soft BPs.  Patient was in  [facility] until 8/16.  Endorses cough since 8/16, mostly dry.  Having left-sided low back spasm.  Robaxin  500 mg q 8 prn not helping, may take the edge off.      Patient Active Problem List   Diagnosis Date Noted  . Severe sepsis (HCC) 06/17/2024  . Acute cystitis 06/17/2024  . Acquired hypothyroidism 06/17/2024  . Generalized weakness 06/16/2024  . 1st degree AV block 10/06/2023  . S/P TAVR (transcatheter aortic valve replacement) 10/05/2023  . Severe aortic stenosis   . Carpal tunnel syndrome   . Neuropathy   . (HFpEF) heart failure with preserved ejection fraction (HCC)   . CKD stage 3b, GFR 30-44 ml/min (HCC)    . Dysfunction of right eustachian tube 09/17/2020  . Excessive cerumen in ear canal, bilateral 02/01/2018  . Sensorineural hearing loss (SNHL), bilateral 01/19/2017  . Hyperparathyroidism (HCC) 02/27/2016  . Chronic low back pain 02/27/2016  . Morbid obesity (HCC) 02/27/2016  . Chronic diastolic CHF (congestive heart failure) (HCC) 10/28/2015  . Chronic kidney disease 05/24/2014  . Essential hypertension 08/21/2013  . HLD (hyperlipidemia) 08/21/2013  . PMB (postmenopausal bleeding) 05/15/2013  . Multiple thyroid  nodules 03/09/2013  . Vitamin D  deficiency 03/09/2013  . Osteoarthritis 02/06/2010  . Vaginal atrophy 01/12/2002    Class: History of  . DM2 (diabetes mellitus, type 2) (HCC) 09/07/2001    Class: History of   Past Medical History:  Diagnosis Date  . (HFpEF) heart failure with preserved ejection fraction (HCC)   . Anemia    History of , resolved  . Carpal tunnel syndrome   . Chronic low back pain 02/27/2016   -DDD -seeing [facility]   . CKD stage 3b, GFR 30-44 ml/min (HCC)   . Diabetes mellitus    type 2, sees Dr. Tommas  . Goiter    Dr. Tommas  . History of blood transfusion    auto transfusion post knee replacement  . Hyperlipidemia   . Hyperparathyroidism (HCC) 02/27/2016   -sees endocrinologist, Dr. Tommas, has seen surgeon as well   . Hypertension   . Migraine    no longer has migraines (80's) after menopause  .  Mitral valve prolapse   . Neuropathy   . Osteoarthritis    DDD, sees [facility] [facility]  . PMB (postmenopausal bleeding) 02/28/2009   Resolved  . S/P TAVR (transcatheter aortic valve replacement) 10/05/2023   s/p TAVR with a 23 mm Edwards S3UR via the TF approach by Dr. Wonda and Lucas  . Severe aortic stenosis   . Vaginal atrophy   . Vitamin D  deficiency    Past Surgical History:  Procedure Laterality Date  . CARPAL TUNNEL RELEASE Right 04/30/2023   Procedure: RIGHT CARPAL TUNNEL RELEASE;  Surgeon: Murrell Drivers, MD;  Location: [facility] OR;   Service: Orthopedics;  Laterality: Right;  30 MIN  . CESAREAN SECTION  1972 &1976  . CHOLECYSTECTOMY  1991  . COLONOSCOPY    . CORONARY ANGIOGRAPHY N/A 08/16/2023   Procedure: CORONARY ANGIOGRAPHY;  Surgeon: Wonda Sharper, MD;  Location: [facility] INVASIVE CV LAB;  Service: Cardiovascular;  Laterality: N/A;  . DILATATION & CURRETTAGE/HYSTEROSCOPY WITH RESECTOCOPE N/A 05/15/2013   Procedure: DILATATION & CURETTAGE/HYSTEROSCOPY WITH RESECTOCOPE;  Surgeon: Shanda SHAUNNA Muscat, MD;  Location: [facility] ORS;  Service: Gynecology;  Laterality: N/A;  . DILATATION & CURRETTAGE/HYSTEROSCOPY WITH RESECTOCOPE N/A 06/03/2017   Procedure: DILATATION & CURETTAGE/HYSTEROSCOPY;  Surgeon: Muscat Shanda SHAUNNA, MD;  Location: [facility] ORS;  Service: Gynecology;  Laterality: N/A;  . DILATION AND CURETTAGE OF UTERUS  1975  . HEMORRHOID SURGERY  1997  . KNEE SURGERY  2000 2011   right knee replacement   . REPLACEMENT TOTAL KNEE  2000   left  . SHOULDER SURGERY  2004 2010   . TRANSTHORACIC ECHOCARDIOGRAM  07/2010   EF=>55%; LA mildly dilated; trace MR/TR;  . TUBAL LIGATION  1976   Social History   Tobacco Use  . Smoking status: Never  . Smokeless tobacco: Never  Vaping Use  . Vaping status: Never Used  Substance Use Topics  . Alcohol use: No    Alcohol/week: 0.0 standard drinks of alcohol  . Drug use: No   Family History  Problem Relation Age of Onset  . Hypertension Mother   . Diabetes Mother   . Hyperlipidemia Mother   . Heart attack Mother   . Heart disease Father   . Heart attack Father   . Stroke Brother   . Heart disease Brother   . Diabetes Brother   . Uterine cancer Maternal Grandmother    Allergies  Allergen Reactions  . Lisinopril  Hives    Cough, hives, and lip edema   . Nsaids Other (See Comments)    Abnormal renal function  . Penicillins Rash and Other (See Comments)    Pt has taken Keflex without difficulty  Has patient had a PCN reaction causing immediate rash, facial/tongue/throat swelling,  SOB or lightheadedness with hypotension: yes  Has patient had a PCN reaction causing severe rash involving mucus membranes or skin necrosis: no  Has patient had a PCN reaction that required hospitalization no  Has patient had a PCN reaction occurring within the last 10 years: no  If all of the above answers are NO, then may proceed with Cephalosporin use.    ROS Negative unless stated above    Objective:     BP (!) 182/84 (BP Location: Left Arm, Patient Position: Sitting, Cuff Size: Normal)   Pulse 97   Temp 98.2 F (36.8 C) (Oral)   Ht 5' 5 (1.651 m)   Wt 175 lb 3.2 oz (79.5 kg)   SpO2 97%   BMI 29.15 kg/m  BP Readings from Last  3 Encounters:  07/17/24 (!) 182/84  06/22/24 (!) 124/56  04/03/24 108/74   Wt Readings from Last 3 Encounters:  07/17/24 175 lb 3.2 oz (79.5 kg)  06/22/24 187 lb 2.7 oz (84.9 kg)  04/03/24 189 lb 9.6 oz (86 kg)      Physical Exam     04/03/2024    1:48 PM 02/21/2024    8:36 AM 02/18/2024    1:26 PM  Depression screen PHQ 2/9  Decreased Interest 0 0 0  Down, Depressed, Hopeless 0 0 0  PHQ - 2 Score 0 0 0  Altered sleeping 0 0 0  Tired, decreased energy 0 0 0  Change in appetite 0 0 0  Feeling bad or failure about yourself  0 0 0  Trouble concentrating 0 0 0  Moving slowly or fidgety/restless 0 0 0  Suicidal thoughts 0 0 0  PHQ-9 Score 0 0 0  Difficult doing work/chores   Not difficult at all      04/03/2024    1:48 PM 02/21/2024    8:37 AM 01/20/2024   10:33 AM 09/08/2023    9:48 AM  GAD 7 : Generalized Anxiety Score  Nervous, Anxious, on Edge 0 0 0 0  Control/stop worrying 0 0 0 0  Worry too much - different things 0 0 0 0  Trouble relaxing 0 0 0 0  Restless 0 0 0 0  Easily annoyed or irritable 0 0 0 0  Afraid - awful might happen 0 0 0 0  Total GAD 7 Score 0 0 0 0  Anxiety Difficulty   Not difficult at all Not difficult at all   Diabetic Foot Exam - Simple   Simple Foot Form Diabetic Foot exam was performed with  the following findings: Yes 07/17/2024 10:01 AM  Visual Inspection See comments: Yes Sensation Testing Intact to touch and monofilament testing bilaterally: Yes Pulse Check Posterior Tibialis and Dorsalis pulse intact bilaterally: Yes Comments Hypertrophic, discolored toenails.  Callus on heels.  Corn on second digit.       No results found for any visits on 07/17/24.    Assessment & Plan:   Spasm of muscle of lower back -     Methocarbamol ; Take 1 tablet (750 mg total) by mouth every 8 (eight) hours as needed for muscle spasms.  Dispense: 60 tablet; Refill: 1  Essential hypertension -     Nebivolol  HCl; Take 1 tablet (5 mg total) by mouth daily.  Dispense: 90 tablet; Refill: 3  Type 2 diabetes mellitus with stage 3b chronic kidney disease, with long-term current use of insulin  (HCC)  Acute cough -     DG Chest 2 View; Future  Chronic diastolic CHF (congestive heart failure) (HCC)  Acquired hypothyroidism    Return in about 2 months (around 09/16/2024), or if symptoms worsen or fail to improve.   Clotilda JONELLE Single, MD

## 2024-07-17 NOTE — Patient Instructions (Signed)
 The blood cultures from the hospital showed E. coli bacteria.  No further antibiotics required at this time.  A prescription for an increased dose of Robaxin  was sent to your pharmacy.  The Flexeril muscle relaxer initially discussed may not be the best due to your heart.  We will try this and adjust if needed.  Sent in new prescription for Nebivolol  5 mg daily.  Continue to monitor your blood pressure at home.  If we need to increase the medication we can do so.    You can try using over-the-counter Allegra for your cough.  Is also available in generic, fexofenadine, which can also be found over-the-counter at your local drugstore.

## 2024-07-20 ENCOUNTER — Other Ambulatory Visit: Payer: Self-pay

## 2024-07-20 DIAGNOSIS — I10 Essential (primary) hypertension: Secondary | ICD-10-CM

## 2024-08-01 ENCOUNTER — Telehealth: Payer: Self-pay | Admitting: Family Medicine

## 2024-08-01 NOTE — Telephone Encounter (Unsigned)
 Copied from CRM [id number]. Topic: General - Other >> Jul 31, 2024  4:42 PM Zebedee SAUNDERS wrote: Reason for CRM: Received call from [facility] per Zavhari ph: [phone] OT evaluation effective the week of 07/30/2024.

## 2024-08-02 ENCOUNTER — Telehealth: Payer: Self-pay | Admitting: *Deleted

## 2024-08-02 ENCOUNTER — Encounter: Payer: Self-pay | Admitting: *Deleted

## 2024-08-02 NOTE — Telephone Encounter (Signed)
 Copied from CRM #[id number]. Topic: Clinical - Home Health Verbal Orders >> Aug 02, 2024  3:56 PM Rea ORN wrote: Caller/Agency: Zacharia, [facility] Callback Number: [phone] Service Requested: Occupational Therapy Frequency: once a week for 5 weeks Any new concerns about the patient? Yes Pt told Zacharia that she was mumbling over the weekend and her family was concerned. Pt wanted PCP to be aware.

## 2024-08-02 NOTE — Telephone Encounter (Signed)
 Called and spoke with Zaharia from [facility] VO given per Dr. Mercer

## 2024-08-04 ENCOUNTER — Ambulatory Visit: Admitting: Family Medicine

## 2024-08-07 ENCOUNTER — Other Ambulatory Visit: Payer: Self-pay | Admitting: Family Medicine

## 2024-08-07 DIAGNOSIS — M62830 Muscle spasm of back: Secondary | ICD-10-CM

## 2024-08-11 ENCOUNTER — Telehealth: Payer: Self-pay | Admitting: Family Medicine

## 2024-08-11 ENCOUNTER — Observation Stay (HOSPITAL_BASED_OUTPATIENT_CLINIC_OR_DEPARTMENT_OTHER)
Admission: EM | Admit: 2024-08-11 | Discharge: 2024-08-13 | Disposition: A | Discharge status: Home Health | Attending: Internal Medicine | Admitting: Internal Medicine

## 2024-08-11 ENCOUNTER — Emergency Department (HOSPITAL_BASED_OUTPATIENT_CLINIC_OR_DEPARTMENT_OTHER)

## 2024-08-11 ENCOUNTER — Encounter (HOSPITAL_COMMUNITY): Payer: Self-pay | Admitting: Internal Medicine

## 2024-08-11 ENCOUNTER — Other Ambulatory Visit: Payer: Self-pay

## 2024-08-11 DIAGNOSIS — R262 Difficulty in walking, not elsewhere classified: Secondary | ICD-10-CM | POA: Insufficient documentation

## 2024-08-11 DIAGNOSIS — I5030 Unspecified diastolic (congestive) heart failure: Secondary | ICD-10-CM | POA: Diagnosis present

## 2024-08-11 DIAGNOSIS — E785 Hyperlipidemia, unspecified: Secondary | ICD-10-CM | POA: Diagnosis not present

## 2024-08-11 DIAGNOSIS — Z952 Presence of prosthetic heart valve: Secondary | ICD-10-CM

## 2024-08-11 DIAGNOSIS — Z7401 Bed confinement status: Secondary | ICD-10-CM | POA: Diagnosis not present

## 2024-08-11 DIAGNOSIS — I5032 Chronic diastolic (congestive) heart failure: Secondary | ICD-10-CM | POA: Insufficient documentation

## 2024-08-11 DIAGNOSIS — Z91040 Latex allergy status: Secondary | ICD-10-CM | POA: Insufficient documentation

## 2024-08-11 DIAGNOSIS — R410 Disorientation, unspecified: Secondary | ICD-10-CM | POA: Diagnosis not present

## 2024-08-11 DIAGNOSIS — R531 Weakness: Secondary | ICD-10-CM | POA: Diagnosis present

## 2024-08-11 DIAGNOSIS — E039 Hypothyroidism, unspecified: Secondary | ICD-10-CM | POA: Diagnosis not present

## 2024-08-11 DIAGNOSIS — N1832 Chronic kidney disease, stage 3b: Secondary | ICD-10-CM | POA: Diagnosis present

## 2024-08-11 DIAGNOSIS — Z959 Presence of cardiac and vascular implant and graft, unspecified: Secondary | ICD-10-CM | POA: Diagnosis not present

## 2024-08-11 DIAGNOSIS — R29898 Other symptoms and signs involving the musculoskeletal system: Secondary | ICD-10-CM | POA: Diagnosis present

## 2024-08-11 DIAGNOSIS — E119 Type 2 diabetes mellitus without complications: Secondary | ICD-10-CM

## 2024-08-11 DIAGNOSIS — E1122 Type 2 diabetes mellitus with diabetic chronic kidney disease: Secondary | ICD-10-CM | POA: Diagnosis not present

## 2024-08-11 DIAGNOSIS — Z23 Encounter for immunization: Secondary | ICD-10-CM | POA: Insufficient documentation

## 2024-08-11 DIAGNOSIS — R7989 Other specified abnormal findings of blood chemistry: Secondary | ICD-10-CM | POA: Diagnosis not present

## 2024-08-11 DIAGNOSIS — I10 Essential (primary) hypertension: Secondary | ICD-10-CM | POA: Diagnosis not present

## 2024-08-11 DIAGNOSIS — Z794 Long term (current) use of insulin: Principal | ICD-10-CM | POA: Insufficient documentation

## 2024-08-11 DIAGNOSIS — I130 Hypertensive heart and chronic kidney disease with heart failure and stage 1 through stage 4 chronic kidney disease, or unspecified chronic kidney disease: Principal | ICD-10-CM | POA: Insufficient documentation

## 2024-08-11 DIAGNOSIS — Z7982 Long term (current) use of aspirin: Secondary | ICD-10-CM | POA: Insufficient documentation

## 2024-08-11 LAB — CBC WITH DIFFERENTIAL/PLATELET
Abs Immature Granulocytes: 0.05 K/uL (ref 0.00–0.07)
Basophils Absolute: 0 K/uL (ref 0.0–0.1)
Basophils Relative: 0 %
Eosinophils Absolute: 0 K/uL (ref 0.0–0.5)
Eosinophils Relative: 0 %
HCT: 29.6 % — ABNORMAL LOW (ref 36.0–46.0)
Hemoglobin: 9.9 g/dL — ABNORMAL LOW (ref 12.0–15.0)
Immature Granulocytes: 0 %
Lymphocytes Relative: 8 %
Lymphs Abs: 0.9 K/uL (ref 0.7–4.0)
MCH: 32.1 pg (ref 26.0–34.0)
MCHC: 33.4 g/dL (ref 30.0–36.0)
MCV: 96.1 fL (ref 80.0–100.0)
Monocytes Absolute: 0.6 K/uL (ref 0.1–1.0)
Monocytes Relative: 5 %
Neutro Abs: 9.7 K/uL — ABNORMAL HIGH (ref 1.7–7.7)
Neutrophils Relative %: 87 %
Platelets: 223 K/uL (ref 150–400)
RBC: 3.08 MIL/uL — ABNORMAL LOW (ref 3.87–5.11)
RDW: 14.3 % (ref 11.5–15.5)
WBC: 11.3 K/uL — ABNORMAL HIGH (ref 4.0–10.5)
nRBC: 0 % (ref 0.0–0.2)

## 2024-08-11 LAB — COMPREHENSIVE METABOLIC PANEL WITH GFR
ALT: 17 U/L (ref 0–44)
AST: 35 U/L (ref 15–41)
Albumin: 3.8 g/dL (ref 3.5–5.0)
Alkaline Phosphatase: 72 U/L (ref 38–126)
Anion gap: 12 (ref 5–15)
BUN: 17 mg/dL (ref 8–23)
CO2: 25 mmol/L (ref 22–32)
Calcium: 11 mg/dL — ABNORMAL HIGH (ref 8.9–10.3)
Chloride: 97 mmol/L — ABNORMAL LOW (ref 98–111)
Creatinine, Ser: 1.11 mg/dL — ABNORMAL HIGH (ref 0.44–1.00)
GFR, Estimated: 48 mL/min — ABNORMAL LOW (ref >60)
Glucose, Bld: 251 mg/dL — ABNORMAL HIGH (ref 70–99)
Potassium: 3.9 mmol/L (ref 3.5–5.1)
Sodium: 135 mmol/L (ref 135–145)
Total Bilirubin: 0.5 mg/dL (ref 0.0–1.2)
Total Protein: 8.2 g/dL — ABNORMAL HIGH (ref 6.5–8.1)

## 2024-08-11 LAB — URINALYSIS, ROUTINE W REFLEX MICROSCOPIC
Bacteria, UA: NONE SEEN
Bilirubin Urine: NEGATIVE
Glucose, UA: NEGATIVE mg/dL
Ketones, ur: NEGATIVE mg/dL
Leukocytes,Ua: NEGATIVE
Nitrite: NEGATIVE
Protein, ur: NEGATIVE mg/dL
Specific Gravity, Urine: 1.01 (ref 1.005–1.030)
pH: 5 (ref 5.0–8.0)

## 2024-08-11 LAB — SARS CORONAVIRUS 2 BY RT PCR: SARS Coronavirus 2 by RT PCR: NEGATIVE

## 2024-08-11 LAB — GLUCOSE, CAPILLARY
Glucose-Capillary: 176 mg/dL — ABNORMAL HIGH (ref 70–99)
Glucose-Capillary: 227 mg/dL — ABNORMAL HIGH (ref 70–99)

## 2024-08-11 LAB — TROPONIN T, HIGH SENSITIVITY
Troponin T High Sensitivity: 49 ng/L — ABNORMAL HIGH (ref 0–19)
Troponin T High Sensitivity: 48 ng/L — ABNORMAL HIGH (ref 0–19)

## 2024-08-11 LAB — LACTIC ACID, PLASMA
Lactic Acid, Venous: 2 mmol/L (ref 0.5–1.9)
Lactic Acid, Venous: 1.6 mmol/L (ref 0.5–1.9)

## 2024-08-11 LAB — CBG MONITORING, ED: Glucose-Capillary: 229 mg/dL — ABNORMAL HIGH (ref 70–99)

## 2024-08-11 LAB — CK: Total CK: 115 U/L (ref 38–234)

## 2024-08-11 MED ORDER — INSULIN ASPART 100 UNIT/ML IJ SOLN
0.0000 [IU] | Freq: Three times a day (TID) | INTRAMUSCULAR | Status: DC
  Administered 2024-08-12: 2 [IU] via SUBCUTANEOUS
  Administered 2024-08-12 – 2024-08-13 (×2): 1 [IU] via SUBCUTANEOUS
  Administered 2024-08-13: 3 [IU] via SUBCUTANEOUS
  Administered 2024-08-13: 1 [IU] via SUBCUTANEOUS

## 2024-08-11 MED ORDER — ONDANSETRON HCL 4 MG/2ML IJ SOLN: 4.0000 mg | Freq: Four times a day (QID) | INTRAMUSCULAR | Status: DC | PRN

## 2024-08-11 MED ORDER — ONDANSETRON HCL 4 MG PO TABS: 4.0000 mg | ORAL_TABLET | Freq: Four times a day (QID) | ORAL | Status: DC | PRN

## 2024-08-11 MED ORDER — ACETAMINOPHEN 500 MG PO TABS
500.0000 mg | ORAL_TABLET | Freq: Four times a day (QID) | ORAL | Status: DC | PRN
  Administered 2024-08-13: 500 mg via ORAL
  Filled 2024-08-11: qty 1

## 2024-08-11 MED ORDER — LACTATED RINGERS IV BOLUS
500.0000 mL | Freq: Once | INTRAVENOUS | Status: AC
  Administered 2024-08-11: 500 mL via INTRAVENOUS

## 2024-08-11 MED ORDER — INFLUENZA VAC SPLIT HIGH-DOSE 0.5 ML IM SUSY
0.5000 mL | PREFILLED_SYRINGE | INTRAMUSCULAR | Status: AC
  Administered 2024-08-12: 0.5 mL via INTRAMUSCULAR
  Filled 2024-08-11: qty 0.5

## 2024-08-11 MED ORDER — ENOXAPARIN SODIUM 40 MG/0.4ML IJ SOSY
40.0000 mg | PREFILLED_SYRINGE | INTRAMUSCULAR | Status: DC
  Administered 2024-08-11 – 2024-08-12 (×2): 40 mg via SUBCUTANEOUS
  Filled 2024-08-11 (×2): qty 0.4

## 2024-08-11 MED ORDER — MAGNESIUM HYDROXIDE 400 MG/5ML PO SUSP: 30.0000 mL | Freq: Every day | ORAL | Status: DC | PRN

## 2024-08-11 MED ORDER — ACETAMINOPHEN 650 MG RE SUPP: 650.0000 mg | Freq: Four times a day (QID) | RECTAL | Status: DC | PRN

## 2024-08-11 NOTE — ED Notes (Signed)
 Called [facility] to transport the patient to [facility] rm# [phone]

## 2024-08-11 NOTE — ED Notes (Signed)
 Patient to CT via stretcher.

## 2024-08-11 NOTE — ED Notes (Signed)
 Pt given peanut crackers and apple sauce per request

## 2024-08-11 NOTE — ED Notes (Signed)
 Writer and RN Devere had done the in and out cath. Pt cleaned prior to procedure and pt tolerated catheter well. Pt cleaned after procedure and urine sent to lab.

## 2024-08-11 NOTE — ED Triage Notes (Signed)
 Patient reports weakness to bilateral lower extremities yesterday. Son states today she was having trouble getting her thoughts together Denies urinary symptoms.

## 2024-08-11 NOTE — ED Notes (Addendum)
 During orthostatic vital signs, pt was unable to fully stand up during the 3 minute standing and stated she was very weak.

## 2024-08-11 NOTE — Telephone Encounter (Signed)
 Copied from CRM [id number]. Topic: General - Other >> Aug 11, 2024  1:40 PM [place] E wrote: Reason for CRM: Grayce with [facility] called stating that they faxed over a plan of care on 9/9, and an add-on discipline on 9/11, and have not gotten a response. Callback number for Grayce is [phone].

## 2024-08-11 NOTE — ED Notes (Signed)
 Pt used bedside commode with assistance. Pt back in bed covers placed, call bell in reach, and visitor at bedside

## 2024-08-11 NOTE — H&P (Signed)
 History and Physical    Patient: Bonnie Chavez FMW:[id number] DOB: 1934-09-30 DOA: 08/11/2024 DOS: the patient was seen and examined on 08/11/2024 PCP: Mercer Clotilda SAUNDERS, MD  Patient coming from: Home  Chief Complaint:  Chief Complaint  Patient presents with   Weakness   HPI: Bonnie Chavez is a 89 y.o. female with medical history significant for hypertension, history of TAVR, CKD stage III, type 2 diabetes mellitus who recently has been plagued with spasms in her lower back.  Patient reports that last night she got out of bed to plug up her phone and slipped to the floor.  She did not hurt at all but she could not get up.  She stayed on the floor and just scooted on the floor as needed.  She did not use her cell phone to call anyone for help.  In the morning her caregiver came over and helped her up.  The patient says something like this has happened before a few months ago during her last hospitalization when she was not able to get up she was found to have UTI and sepsis.  She was in the hospital for a week at that time. In the last couple weeks the patient has been plagued with severe spasms of her lower back.  She has been taking scheduled Tylenol  and methocarbamol .  She says her back is actually doing much better.  Her last spasm was about 3 days ago and it was very mild compared to how they used to be.  She says she has been eating well.  She denies diarrhea or constipation or sore throat or shortness of breath. In the emergency department the patient had basic blood work and chest x-ray which revealed no acute abnormalities.  Her EKG was abnormal and her troponin was slightly elevated in the 40s but it was trended and remained flat.  Patient has not had any chest pain.  She is going to be admitted overnight to the hospitalist for observation and monitoring.      Review of Systems: As mentioned in the history of present illness. All other systems reviewed and are  negative. Past Medical History:  Diagnosis Date   (HFpEF) heart failure with preserved ejection fraction (HCC)    Anemia    History of , resolved   Carpal tunnel syndrome    Chronic low back pain 02/27/2016   -DDD -seeing [facility]    CKD stage 3b, GFR 30-44 ml/min (HCC)    Diabetes mellitus    type 2, sees Dr. Tommas   Goiter    Dr. Tommas   History of blood transfusion    auto transfusion post knee replacement   Hyperlipidemia    Hyperparathyroidism (HCC) 02/27/2016   -sees endocrinologist, Dr. Tommas, has seen surgeon as well    Hypertension    Migraine    no longer has migraines (80's) after menopause   Mitral valve prolapse    Neuropathy    Osteoarthritis    DDD, sees [place] [facility]   PMB (postmenopausal bleeding) 02/28/2009   Resolved   S/P TAVR (transcatheter aortic valve replacement) 10/05/2023   s/p TAVR with a 23 mm Edwards S3UR via the TF approach by Dr. Wonda and [facility]   Severe aortic stenosis    Vaginal atrophy    Vitamin D  deficiency    Past Surgical History:  Procedure Laterality Date   CARPAL TUNNEL RELEASE Right 04/30/2023   Procedure: RIGHT CARPAL TUNNEL RELEASE;  Surgeon: Murrell Drivers, MD;  Location: [facility] OR;  Service: Orthopedics;  Laterality: Right;  30 MIN   CESAREAN SECTION  1972 &1976   CHOLECYSTECTOMY  1991   COLONOSCOPY     CORONARY ANGIOGRAPHY N/A 08/16/2023   Procedure: CORONARY ANGIOGRAPHY;  Surgeon: Wonda Sharper, MD;  Location: [facility] INVASIVE CV LAB;  Service: Cardiovascular;  Laterality: N/A;   DILATATION & CURRETTAGE/HYSTEROSCOPY WITH RESECTOCOPE N/A 05/15/2013   Procedure: DILATATION & CURETTAGE/HYSTEROSCOPY WITH RESECTOCOPE;  Surgeon: Shanda SHAUNNA Muscat, MD;  Location: [facility] ORS;  Service: Gynecology;  Laterality: N/A;   DILATATION & CURRETTAGE/HYSTEROSCOPY WITH RESECTOCOPE N/A 06/03/2017   Procedure: DILATATION & CURETTAGE/HYSTEROSCOPY;  Surgeon: Muscat Shanda SHAUNNA, MD;  Location: [facility] ORS;  Service: Gynecology;  Laterality: N/A;   DILATION  AND CURETTAGE OF UTERUS  1975   HEMORRHOID SURGERY  1997   KNEE SURGERY  2000 2011   right knee replacement    REPLACEMENT TOTAL KNEE  2000   left   SHOULDER SURGERY  2004 2010    TRANSTHORACIC ECHOCARDIOGRAM  07/2010   EF=>55%; LA mildly dilated; trace MR/TR;   TUBAL LIGATION  1976   Social History:  reports that she has never smoked. She has never used smokeless tobacco. She reports that she does not drink alcohol and does not use drugs.  Allergies  Allergen Reactions   Lisinopril  Hives, Other (See Comments) and Cough    And lip edema    Aspirin     Ibuprofen     Other Reaction(s): Unknown   Nsaids Other (See Comments)    Abnormal renal function   Penicillins Rash and Other (See Comments)    Pt has taken Keflex without difficulty Has patient had a PCN reaction causing immediate rash, facial/tongue/throat swelling, SOB or lightheadedness with hypotension: yes      Family History  Problem Relation Age of Onset   Hypertension Mother    Diabetes Mother    Hyperlipidemia Mother    Heart attack Mother    Heart disease Father    Heart attack Father    Stroke Brother    Heart disease Brother    Diabetes Brother    Uterine cancer Maternal Grandmother     Prior to Admission medications   Medication Sig Start Date End Date Taking? Authorizing Provider  acetaminophen  (TYLENOL ) 500 MG tablet Take 500 mg by mouth every 6 (six) hours as needed (Arthritis).    [provider]  aspirin  81 MG EC tablet Take 81 mg by mouth at bedtime. Swallow whole.    [provider]  atorvastatin  (LIPITOR) 80 MG tablet Take 1 tablet (80 mg total) by mouth daily. 06/23/24   Jillian Buttery, MD  cinacalcet  (SENSIPAR ) 30 MG tablet Take 30 mg by mouth See admin instructions. Take one tablet by mouth Monday-Friday until discontinued by PCP. 11/01/19   [provider]  feeding supplement, GLUCERNA SHAKE, (GLUCERNA SHAKE) LIQD Take 237 mLs by mouth 3 (three) times daily between  meals. 06/22/24   Jillian Buttery, MD  furosemide  (LASIX ) 20 MG tablet Take 40 mg by mouth every morning. Can take additional tablet if needed 05/19/18   Luke Chiquita SAUNDERS, DO  HUMULIN 70/30 KWIKPEN (70-30) 100 UNIT/ML KwikPen Inject 6-8 Units into the skin See admin instructions. 6 units in the AM and 8 units in the pm 02/24/21   [provider]  levothyroxine  (SYNTHROID ) 50 MCG tablet Take 50 mcg by mouth daily before breakfast.    [provider]  methocarbamol  (ROBAXIN ) 750 MG tablet TAKE 1 TABLET (750 MG TOTAL)  BY MOUTH EVERY 8 (EIGHT) HOURS AS NEEDED FOR MUSCLE SPASMS. 08/09/24   Mercer Clotilda SAUNDERS, MD  nebivolol  (BYSTOLIC ) 5 MG tablet Take 1 tablet (5 mg total) by mouth daily. 07/17/24   Mercer Clotilda SAUNDERS, MD  pantoprazole  (PROTONIX ) 40 MG tablet Take 1 tablet (40 mg total) by mouth daily. 06/23/24   Adhikari, Amrit, MD  polyethylene glycol (MIRALAX  / GLYCOLAX ) 17 g packet Take 17 g by mouth daily. 06/23/24   Jillian Buttery, MD  senna-docusate (SENOKOT-S) 8.6-50 MG tablet Take 1 tablet by mouth 2 (two) times daily. 06/22/24   Adhikari, Amrit, MD  Trolamine Salicylate (BLUE-EMU HEMP EX) Apply 1 application  topically daily as needed (pain).    [provider]    Physical Exam: Vitals:   08/11/24 1515 08/11/24 1530 08/11/24 1600 08/11/24 1804  BP: (!) 138/59 (!) 147/55 (!) 138/53 (!) 151/63  Pulse: 67 72 69 69  Resp: (!) 24 (!) 24 (!) 22 18  Temp:    99.2 F (37.3 C)  TempSrc:    Oral  SpO2: 98% 99% 97% 99%  Weight:      Height:       Physical Exam:  General: No acute distress, well developed, well nourished HEENT: Normocephalic, atraumatic, PERRL Cardiovascular: Normal rate and rhythm. Murmur. Distal pulses intact. Pulmonary: Normal pulmonary effort, normal breath sounds Gastrointestinal: Nondistended abdomen, soft, non-tender, normoactive bowel sounds Musculoskeletal:No lower ext edema Right leg has well healed scar over knee.  The right leg is larger than the  left Lymphadenopathy: No cervical LAD. Skin: Skin is warm and dry. Neuro: AAOx3. Unable to hold left leg in the air. Right leg stonger than left but can only be held up temporarily. PSYCH: Attentive and cooperative  Data Reviewed:  Results for orders placed or performed during the hospital encounter of 08/11/24 (from the past 24 hours)  CBG monitoring, ED     Status: Abnormal   Collection Time: 08/11/24 10:09 AM  Result Value Ref Range   Glucose-Capillary 229 (H) 70 - 99 mg/dL  Blood culture (routine x 2)     Status: None (Preliminary result)   Collection Time: 08/11/24 10:20 AM   Specimen: BLOOD  Result Value Ref Range   Specimen Description      BLOOD RIGHT ANTECUBITAL Performed at [facility], [address], [place], [place] [address]    Special Requests      BOTTLES DRAWN AEROBIC AND ANAEROBIC Blood Culture adequate volume Performed at [facility], [address], [place], [place] [address]    Culture PENDING    Report Status PENDING   Blood culture (routine x 2)     Status: None (Preliminary result)   Collection Time: 08/11/24 10:25 AM   Specimen: BLOOD RIGHT FOREARM  Result Value Ref Range   Specimen Description BLOOD RIGHT FOREARM    Special Requests      BOTTLES DRAWN AEROBIC AND ANAEROBIC Blood Culture adequate volume Performed at [facility], [address] [place], [place] [address]    Culture PENDING    Report Status PENDING   CBC with Differential     Status: Abnormal   Collection Time: 08/11/24 10:35 AM  Result Value Ref Range   WBC 11.3 (H) 4.0 - 10.5 K/uL   RBC 3.08 (L) 3.87 - 5.11 MIL/uL   Hemoglobin 9.9 (L) 12.0 - 15.0 g/dL   HCT 70.3 (L) 63.9 - 53.9 %   MCV 96.1 80.0 - 100.0 fL   MCH 32.1  26.0 - 34.0 pg   MCHC 33.4 30.0 - 36.0 g/dL   RDW 85.6 88.4 - 84.4 %   Platelets 223 150 - 400 K/uL   nRBC 0.0 0.0 - 0.2 %   Neutrophils Relative % 87 %   Neutro Abs 9.7 (H) 1.7 - 7.7 K/uL   Lymphocytes Relative 8 %    Lymphs Abs 0.9 0.7 - 4.0 K/uL   Monocytes Relative 5 %   Monocytes Absolute 0.6 0.1 - 1.0 K/uL   Eosinophils Relative 0 %   Eosinophils Absolute 0.0 0.0 - 0.5 K/uL   Basophils Relative 0 %   Basophils Absolute 0.0 0.0 - 0.1 K/uL   Immature Granulocytes 0 %   Abs Immature Granulocytes 0.05 0.00 - 0.07 K/uL  Comprehensive metabolic panel     Status: Abnormal   Collection Time: 08/11/24 10:35 AM  Result Value Ref Range   Sodium 135 135 - 145 mmol/L   Potassium 3.9 3.5 - 5.1 mmol/L   Chloride 97 (L) 98 - 111 mmol/L   CO2 25 22 - 32 mmol/L   Glucose, Bld 251 (H) 70 - 99 mg/dL   BUN 17 8 - 23 mg/dL   Creatinine, Ser 8.88 (H) 0.44 - 1.00 mg/dL   Calcium  11.0 (H) 8.9 - 10.3 mg/dL   Total Protein 8.2 (H) 6.5 - 8.1 g/dL   Albumin 3.8 3.5 - 5.0 g/dL   AST 35 15 - 41 U/L   ALT 17 0 - 44 U/L   Alkaline Phosphatase 72 38 - 126 U/L   Total Bilirubin 0.5 0.0 - 1.2 mg/dL   GFR, Estimated 48 (L) >60 mL/min   Anion gap 12 5 - 15  Urinalysis, Routine w reflex microscopic -Urine, Catheterized     Status: Abnormal   Collection Time: 08/11/24 10:35 AM  Result Value Ref Range   Color, Urine YELLOW YELLOW   APPearance CLEAR CLEAR   Specific Gravity, Urine 1.010 1.005 - 1.030   pH 5.0 5.0 - 8.0   Glucose, UA NEGATIVE NEGATIVE mg/dL   Hgb urine dipstick TRACE (A) NEGATIVE   Bilirubin Urine NEGATIVE NEGATIVE   Ketones, ur NEGATIVE NEGATIVE mg/dL   Protein, ur NEGATIVE NEGATIVE mg/dL   Nitrite NEGATIVE NEGATIVE   Leukocytes,Ua NEGATIVE NEGATIVE   RBC / HPF 0-5 0 - 5 RBC/hpf   WBC, UA 0-5 0 - 5 WBC/hpf   Bacteria, UA NONE SEEN NONE SEEN   Squamous Epithelial / HPF 0-5 0 - 5 /HPF  Lactic acid, plasma     Status: Abnormal   Collection Time: 08/11/24 10:35 AM  Result Value Ref Range   Lactic Acid, Venous 2.0 (HH) 0.5 - 1.9 mmol/L  Troponin T, High Sensitivity     Status: Abnormal   Collection Time: 08/11/24 10:35 AM  Result Value Ref Range   Troponin T High Sensitivity 49 (H) 0 - 19 ng/L  CK      Status: None   Collection Time: 08/11/24 10:35 AM  Result Value Ref Range   Total CK 115 38 - 234 U/L  Troponin T, High Sensitivity     Status: Abnormal   Collection Time: 08/11/24 12:14 PM  Result Value Ref Range   Troponin T High Sensitivity 48 (H) 0 - 19 ng/L  Lactic acid, plasma     Status: None   Collection Time: 08/11/24  1:05 PM  Result Value Ref Range   Lactic Acid, Venous 1.6 0.5 - 1.9 mmol/L  Glucose, capillary     Status:  Abnormal   Collection Time: 08/11/24  6:14 PM  Result Value Ref Range   Glucose-Capillary 176 (H) 70 - 99 mg/dL     Assessment and Plan: Weakness/ fall and could not get up - No evidence of rhabdomyolysis with a normal CK - She remains weak but there is no clear evidence of infection, with normal UA, chest x-ray, and no striking symptoms - Will check COVID test and blood cultures, But empiric antibiotics have not been started because of lack of clear evidence of infection - PT/OT - Follow vitals.  Tmax= 99.2  2.  Intermittent confusion - this was reported by her son.  The patient is an excellent historian alert and appropriate during my evaluation, She reports that she has been taking scheduled muscle relaxer. While her spasms are noticeably improved the muscle relaxer certainly could explain intermittent mild confusion. - May change the rmuscle relaxer to bedtime and prn  3. H/o TAVR - She is not on a blood thinners at baseline.  Continue routine medications  4. Htn -continue routine medications   Advance Care Planning:   Code Status: Full Code The patient's son [name] is her healthcare power of attorney.  There is a copy of the power of attorney papers located in the chart.  She wants to be full code.  Consults: none  Family Communication: none  Severity of Illness: The appropriate patient status for this patient is OBSERVATION. Observation status is judged to be reasonable and necessary in order to provide the required intensity of service to  ensure the patient's safety. The patient's presenting symptoms, physical exam findings, and initial radiographic and laboratory data in the context of their medical condition is felt to place them at decreased risk for further clinical deterioration. Furthermore, it is anticipated that the patient will be medically stable for discharge from the hospital within 2 midnights of admission.   Author: ARTHEA CHILD, MD 08/11/2024 6:22 PM  For on call review [email or web address].

## 2024-08-11 NOTE — ED Provider Notes (Addendum)
  EMERGENCY DEPARTMENT AT [facility] Provider Note   CSN: [id number] Arrival date & time: 08/11/24  9045     Patient presents with: Weakness   Bonnie Chavez is a 89 y.o. female.   HPI 89 year old female with multiple medical problems including admission for sepsis in July presents today with complaints of leg weakness last night, fall, stayed on the ground last night.  She denies any injury or ongoing pain.  Her son is present at the bedside.  He reports that this is how she presented with sepsis.  He reports that she has been having some increased waxing and waning confusion over the past several weeks.  She has been home from rehab since August 15.  She lives alone.  He has been following her blood sugars and assisting with medications.  The patient is ambulatory with a walker.  Patient and son both report that she has been eating without difficulty.  There has been some mild weight loss.  She has not had any fever, chills, chest pain, dyspnea, nausea, vomiting, diarrhea.  She does not think she injured anything when she fell last night.  She does not think that she struck her head.  She was unable to get back up off the floor but was able to pull a stool near her and was leaning up back against the stool and her son came over this morning.  He was able to get her up and she was able to bear weight.  However she is not walking as usual.    Prior to Admission medications   Medication Sig Start Date End Date Taking? Authorizing Provider  acetaminophen  (TYLENOL ) 500 MG tablet Take 500 mg by mouth every 6 (six) hours.   Yes [provider]  aspirin  81 MG EC tablet Take 81 mg by mouth at bedtime. Swallow whole.   Yes [provider]  cinacalcet  (SENSIPAR ) 30 MG tablet Take 30 mg by mouth See admin instructions. Take 30 mg by mouth in the morning on Mon/Tues/Wed/Thurs/Fri only 11/01/19  Yes [provider]  feeding supplement, GLUCERNA SHAKE,  (GLUCERNA SHAKE) LIQD Take 237 mLs by mouth 3 (three) times daily between meals. Patient taking differently: Take 237 mLs by mouth daily. 06/22/24  Yes Jillian Buttery, MD  furosemide  (LASIX ) 20 MG tablet Take 40 mg by mouth every morning. 05/19/18  Yes Luke Lacks R, DO  HUMULIN 70/30 KWIKPEN (70-30) 100 UNIT/ML KwikPen Inject 6-8 Units into the skin See admin instructions. Inject 6 units into the skin the morning and 8 units in the evening 02/24/21  Yes [provider]  levothyroxine  (SYNTHROID ) 50 MCG tablet Take 50 mcg by mouth daily before breakfast.   Yes [provider]  nebivolol  (BYSTOLIC ) 10 MG tablet Take 10 mg by mouth in the morning.   Yes [provider]  nebivolol  (BYSTOLIC ) 5 MG tablet Take 1 tablet (5 mg total) by mouth daily. Patient taking differently: Take 5 mg by mouth at bedtime. 07/17/24  Yes Mercer Clotilda SAUNDERS, MD  rosuvastatin  (CRESTOR ) 40 MG tablet Take 40 mg by mouth every evening.   Yes [provider]  Trolamine Salicylate (BLUE-EMU HEMP EX) Apply 1 application  topically daily as needed (pain- affected sites).   Yes [provider]  pantoprazole  (PROTONIX ) 40 MG tablet Take 1 tablet (40 mg total) by mouth daily. Patient not taking: Reported on 08/11/2024 06/23/24   Adhikari, Amrit, MD  polyethylene glycol (MIRALAX  / GLYCOLAX ) 17 g packet Take 17  g by mouth daily. Patient not taking: Reported on 08/11/2024 06/23/24   Jillian Buttery, MD  senna-docusate (SENOKOT-S) 8.6-50 MG tablet Take 1 tablet by mouth 2 (two) times daily. Patient not taking: Reported on 08/11/2024 06/22/24   Jillian Buttery, MD    Allergies: Lisinopril , Ibuprofen, Nsaids, Latex, and Penicillins    Review of Systems  Updated Vital Signs BP (!) 169/60 (BP Location: Left Arm)   Pulse 66   Temp 98.3 F (36.8 C) (Oral)   Resp 18   Ht 1.651 m (5' 5)   Wt 78.4 kg   SpO2 100%   BMI 28.76 kg/m   Physical Exam Vitals and nursing note reviewed.  Constitutional:       Appearance: Normal appearance.  HENT:     Head: Normocephalic and atraumatic.     Right Ear: External ear normal.     Left Ear: External ear normal.     Nose: Nose normal.     Mouth/Throat:     Pharynx: Oropharynx is clear.  Eyes:     Extraocular Movements: Extraocular movements intact.     Pupils: Pupils are equal, round, and reactive to light.  Cardiovascular:     Rate and Rhythm: Normal rate and regular rhythm.     Pulses: Normal pulses.     Heart sounds: Normal heart sounds.  Pulmonary:     Effort: Pulmonary effort is normal.  Abdominal:     General: Abdomen is flat.     Palpations: Abdomen is soft.  Musculoskeletal:        General: Normal range of motion.     Cervical back: Normal range of motion.  Skin:    General: Skin is warm and dry.     Capillary Refill: Capillary refill takes less than 2 seconds.  Neurological:     General: No focal deficit present.     Mental Status: She is alert.     Cranial Nerves: No cranial nerve deficit.     Motor: No weakness.     Gait: Gait normal.  Psychiatric:        Mood and Affect: Mood normal.        Behavior: Behavior normal.     (all labs ordered are listed, but only abnormal results are displayed) Labs Reviewed  CBC WITH DIFFERENTIAL/PLATELET - Abnormal; Notable for the following components:      Result Value   WBC 11.3 (*)    RBC 3.08 (*)    Hemoglobin 9.9 (*)    HCT 29.6 (*)    Neutro Abs 9.7 (*)    All other components within normal limits  COMPREHENSIVE METABOLIC PANEL WITH GFR - Abnormal; Notable for the following components:   Chloride 97 (*)    Glucose, Bld 251 (*)    Creatinine, Ser 1.11 (*)    Calcium  11.0 (*)    Total Protein 8.2 (*)    GFR, Estimated 48 (*)    All other components within normal limits  URINALYSIS, ROUTINE W REFLEX MICROSCOPIC - Abnormal; Notable for the following components:   Hgb urine dipstick TRACE (*)    All other components within normal limits  LACTIC ACID, PLASMA - Abnormal;  Notable for the following components:   Lactic Acid, Venous 2.0 (*)    All other components within normal limits  GLUCOSE, CAPILLARY - Abnormal; Notable for the following components:   Glucose-Capillary 176 (*)    All other components within normal limits  BASIC METABOLIC PANEL WITH GFR - Abnormal; Notable for  the following components:   Glucose, Bld 147 (*)    Calcium  10.4 (*)    GFR, Estimated 56 (*)    All other components within normal limits  CBC - Abnormal; Notable for the following components:   RBC 2.95 (*)    Hemoglobin 9.4 (*)    HCT 29.5 (*)    All other components within normal limits  GLUCOSE, CAPILLARY - Abnormal; Notable for the following components:   Glucose-Capillary 227 (*)    All other components within normal limits  GLUCOSE, CAPILLARY - Abnormal; Notable for the following components:   Glucose-Capillary 140 (*)    All other components within normal limits  GLUCOSE, CAPILLARY - Abnormal; Notable for the following components:   Glucose-Capillary 240 (*)    All other components within normal limits  GLUCOSE, CAPILLARY - Abnormal; Notable for the following components:   Glucose-Capillary 181 (*)    All other components within normal limits  CBC - Abnormal; Notable for the following components:   WBC 11.0 (*)    RBC 2.91 (*)    Hemoglobin 9.5 (*)    HCT 29.1 (*)    All other components within normal limits  BASIC METABOLIC PANEL WITH GFR - Abnormal; Notable for the following components:   Glucose, Bld 173 (*)    GFR, Estimated 58 (*)    All other components within normal limits  GLUCOSE, CAPILLARY - Abnormal; Notable for the following components:   Glucose-Capillary 223 (*)    All other components within normal limits  GLUCOSE, CAPILLARY - Abnormal; Notable for the following components:   Glucose-Capillary 157 (*)    All other components within normal limits  GLUCOSE, CAPILLARY - Abnormal; Notable for the following components:   Glucose-Capillary 179 (*)     All other components within normal limits  GLUCOSE, CAPILLARY - Abnormal; Notable for the following components:   Glucose-Capillary 252 (*)    All other components within normal limits  CBG MONITORING, ED - Abnormal; Notable for the following components:   Glucose-Capillary 229 (*)    All other components within normal limits  TROPONIN T, HIGH SENSITIVITY - Abnormal; Notable for the following components:   Troponin T High Sensitivity 49 (*)    All other components within normal limits  TROPONIN T, HIGH SENSITIVITY - Abnormal; Notable for the following components:   Troponin T High Sensitivity 48 (*)    All other components within normal limits  CULTURE, BLOOD (ROUTINE X 2)  CULTURE, BLOOD (ROUTINE X 2)  SARS CORONAVIRUS 2 BY RT PCR  CK  LACTIC ACID, PLASMA  TSH  MAGNESIUM     EKG: EKG Interpretation Date/Time:  Friday August 11 2024 10:05:48 EDT Ventricular Rate:  74 PR Interval:  225 QRS Duration:  91 QT Interval:  389 QTC Calculation: 432 R Axis:   -19  Text Interpretation: Sinus rhythm Ventricular premature complex Prolonged PR interval Left ventricular hypertrophy Confirmed by Levander Houston [phone]) on 08/11/2024 10:18:09 AM  Radiology: ARCOLA HIP UNILAT WITH PELVIS 2-3 VIEWS RIGHT Result Date: 08/13/2024 CLINICAL DATA:  221993 Right hip pain 221993.  Fall. EXAM: DG HIP (WITH OR WITHOUT PELVIS) 2-3V RIGHT COMPARISON:  None Available. FINDINGS: Pelvis is intact with normal and symmetric sacroiliac joints. No acute fracture or dislocation. No aggressive osseous lesion. Visualized sacral arcuate lines are unremarkable. There are changes of chronic pubic symphisitis. There are moderate to severe degenerative changes of bilateral hip joints in the form of reduced joint space and osteophytosis. No radiopaque foreign bodies.  IMPRESSION: *No acute osseous abnormality of the pelvis or right hip joint. Electronically Signed   By: Ree Molt M.D.   On: 08/13/2024 14:24      Procedures   Medications Ordered in the ED  lactated ringers  bolus 500 mL (0 mLs Intravenous Stopped 08/11/24 1252)  Influenza vac split trivalent PF (FLUZONE  HIGH-DOSE) injection 0.5 mL (0.5 mLs Intramuscular Given 08/12/24 1114)    Clinical Course as of 08/14/24 1201  Fri Aug 11, 2024  1229 CT head obtained reviewed no evidence of acute abnormality is noted radiologist interpretation concurs [DR]  1229 Chest x-Josemaria Brining noted to have some increased cardiac shadow, radiologist interpretation notes mild enlargement of the cardiac pericardial silhouette without edema [DR]  1229 Troponin T is elevated at 49 [DR]  1230 Urine is significant for 0-5 red blood cells 0-5 white blood cells no bacteria 0-5 squamous epithelial cells noted [DR]  1230 Lactic acid is elevated at 2 [DR]  1230 Glucose elevated at 251 creatinine 1.11 which is improved from baseline Calcium  is elevated at 11 White blood cell count slightly elevated 11,300 [DR]  1230 Anemia noted with hemoglobin 9.9 and stable from prior [DR]    Clinical Course User Index [DR] Levander Houston, MD                                 Medical Decision Making Amount and/or Complexity of Data Reviewed Labs: ordered. Radiology: ordered.  Risk Decision regarding hospitalization.   89 year old female history type 2 diabetes, hypertension, hyperlipidemia, CKD, morbid obesity, status post TAVR, sepsis presents today with weakness, fall last night and concerns for sepsis. Here in the ED patient with normal heart rate, normal temperature, normal oxygen saturation and blood pressure 119/58. Patient is evaluated with EKG which has some nonspecific findings with ST elevation in V2 and ST depression and 30 that are new from first prior but no ST elevation in contiguous leads consistent with MI  Patient with weakness and fall. 1 weakness patient with normal neuroexam and had labs obtained to evaluate for other etiologies 2 patient with abnormal EKG  and elevated troponin here at 49.  Patient without complaints of chest pain or dyspnea. Repeat trop flat at 48 3 elevated lactic acid at 2 with known history of sepsis with similar presentation.  Urine was obtained that does not show any evidence of acute infection.  Chest x-Jyaire Koudelka obtained without any evidence of acute infection exam done and no evidence of abnormality noted on skin exam. Patient has received 1 L of LR.  Awaiting second lactic. 4 patient with fall last night.  Physical exam done with no obvious injuries.  CT obtained of head due to this that shows no evidence of acute abnormality.  Total CK obtained that is normal Care discussed with Dr. Kenny who will admit.  She requested that I consult cardiology.  Cardiology consult has been paged awaiting callback     Final diagnoses:  Type 2 diabetes mellitus with stage 3b chronic kidney disease, with long-term current use of insulin  [facility] [place])    ED Discharge Orders          Ordered    Increase activity slowly        08/13/24 1538    Discharge instructions       Comments: Follow-up with your primary care provider in 1 week.  Check blood work at that time.  Continue physical therapy at home.  Do  not take muscle relaxants if possible.   08/13/24 1538    Diet Carb Modified        08/13/24 1538               Levander Houston, MD 08/14/24 1200    Levander Houston, MD 08/14/24 1201

## 2024-08-11 NOTE — ED Notes (Signed)
 Pt used bedside commode with assistance. Pt back in bed and on the monitor

## 2024-08-11 NOTE — ED Notes (Signed)
 Attempted to call [facility] for a consult, was told to call back in 5 minutes.

## 2024-08-11 NOTE — ED Notes (Signed)
 ED Provider at bedside.

## 2024-08-12 DIAGNOSIS — R29898 Other symptoms and signs involving the musculoskeletal system: Secondary | ICD-10-CM | POA: Diagnosis not present

## 2024-08-12 LAB — CBC
HCT: 29.5 % — ABNORMAL LOW (ref 36.0–46.0)
Hemoglobin: 9.4 g/dL — ABNORMAL LOW (ref 12.0–15.0)
MCH: 31.9 pg (ref 26.0–34.0)
MCHC: 31.9 g/dL (ref 30.0–36.0)
MCV: 100 fL (ref 80.0–100.0)
Platelets: 203 K/uL (ref 150–400)
RBC: 2.95 MIL/uL — ABNORMAL LOW (ref 3.87–5.11)
RDW: 14.6 % (ref 11.5–15.5)
WBC: 7.6 K/uL (ref 4.0–10.5)
nRBC: 0 % (ref 0.0–0.2)

## 2024-08-12 LAB — BASIC METABOLIC PANEL WITH GFR
Anion gap: 12 (ref 5–15)
BUN: 13 mg/dL (ref 8–23)
CO2: 25 mmol/L (ref 22–32)
Calcium: 10.4 mg/dL — ABNORMAL HIGH (ref 8.9–10.3)
Chloride: 104 mmol/L (ref 98–111)
Creatinine, Ser: 0.97 mg/dL (ref 0.44–1.00)
GFR, Estimated: 56 mL/min — ABNORMAL LOW (ref >60)
Glucose, Bld: 147 mg/dL — ABNORMAL HIGH (ref 70–99)
Potassium: 3.8 mmol/L (ref 3.5–5.1)
Sodium: 141 mmol/L (ref 135–145)

## 2024-08-12 LAB — GLUCOSE, CAPILLARY
Glucose-Capillary: 140 mg/dL — ABNORMAL HIGH (ref 70–99)
Glucose-Capillary: 240 mg/dL — ABNORMAL HIGH (ref 70–99)
Glucose-Capillary: 181 mg/dL — ABNORMAL HIGH (ref 70–99)
Glucose-Capillary: 223 mg/dL — ABNORMAL HIGH (ref 70–99)

## 2024-08-12 LAB — TSH: TSH: 0.709 u[IU]/mL (ref 0.350–4.500)

## 2024-08-12 MED ORDER — SENNOSIDES-DOCUSATE SODIUM 8.6-50 MG PO TABS
1.0000 | ORAL_TABLET | Freq: Two times a day (BID) | ORAL | Status: DC
  Administered 2024-08-12 – 2024-08-13 (×3): 1 via ORAL
  Filled 2024-08-12 (×3): qty 1

## 2024-08-12 MED ORDER — GLUCERNA SHAKE PO LIQD
237.0000 mL | Freq: Three times a day (TID) | ORAL | Status: DC
  Administered 2024-08-12 – 2024-08-13 (×3): 237 mL via ORAL
  Filled 2024-08-12 (×6): qty 237

## 2024-08-12 MED ORDER — NEBIVOLOL HCL 10 MG PO TABS
10.0000 mg | ORAL_TABLET | Freq: Every day | ORAL | Status: DC
  Administered 2024-08-12 – 2024-08-13 (×2): 10 mg via ORAL
  Filled 2024-08-12 (×2): qty 1

## 2024-08-12 MED ORDER — ASPIRIN 81 MG PO TBEC
81.0000 mg | DELAYED_RELEASE_TABLET | Freq: Every day | ORAL | Status: DC
  Administered 2024-08-12: 81 mg via ORAL
  Filled 2024-08-12: qty 1

## 2024-08-12 MED ORDER — POLYETHYLENE GLYCOL 3350 17 G PO PACK
17.0000 g | PACK | Freq: Every day | ORAL | Status: DC
  Administered 2024-08-12: 17 g via ORAL
  Filled 2024-08-12 (×2): qty 1

## 2024-08-12 MED ORDER — PANTOPRAZOLE SODIUM 40 MG PO TBEC
40.0000 mg | DELAYED_RELEASE_TABLET | Freq: Every day | ORAL | Status: DC
  Administered 2024-08-12 – 2024-08-13 (×2): 40 mg via ORAL
  Filled 2024-08-12 (×2): qty 1

## 2024-08-12 MED ORDER — LEVOTHYROXINE SODIUM 50 MCG PO TABS
50.0000 ug | ORAL_TABLET | Freq: Every day | ORAL | Status: DC
  Administered 2024-08-13: 50 ug via ORAL
  Filled 2024-08-12: qty 1

## 2024-08-12 MED ORDER — ROSUVASTATIN CALCIUM 20 MG PO TABS
40.0000 mg | ORAL_TABLET | Freq: Every evening | ORAL | Status: DC
  Administered 2024-08-12 – 2024-08-13 (×2): 40 mg via ORAL
  Filled 2024-08-12 (×2): qty 2

## 2024-08-12 NOTE — Plan of Care (Signed)

## 2024-08-12 NOTE — Hospital Course (Signed)
 Bonnie Chavez is a 89 y.o. female with medical history significant for hypertension, history of TAVR, CKD stage III, type 2 diabetes mellitus presented to hospital with back spasms difficulty ambulating.  She had fall and was on the floor overnight when her caregiver saw her in the morning.  She complained of severe spasms in her back for couple of weeks and was taking Tylenol  and muscle relaxant.  In the ED patient had stable vitals.  Labs were notable for WBC at 11.3.  Hemoglobin 9.9, urinalysis was negative for infection.  Total CK of 115.  BMP showed creatinine of 1.1.  Lactate was elevated at 2.0.  EKG was abnormal and still troponin slightly elevated but flat.  Denies any chest pain.  Blood cultures were drawn and patient was admitted hospital for further evaluation and treatment.  Generalized weakness/ fall  No evidence of infection normal UA chest x-ray.  Follow-up blood cultures.  Get PT OT.  Tmax of 99.2 F.  Hold Lasix  for now  Abnormal EKG troponin flat.  ED provider had spoken with Dr. CLEMENTEEN Betters cardiology.  No concerns at this time.  Intermittent confusion - Reported by her son.  Had been taking muscle relaxant.  This has been changed to bedtime and as needed.    H/o TAVR -not on blood thinners.  On aspirin  at home.  Will add  Hypothyroidism.  Resume Synthroid ..  Essential hypertension-will resume Bystolic   Hyperlipidemia continue Crestor 

## 2024-08-12 NOTE — Progress Notes (Addendum)
 PROGRESS NOTE  Bonnie Chavez FMW:[id number] DOB: 06-Jan-1934 DOA: 08/11/2024 PCP: Mercer Clotilda SAUNDERS, MD   LOS: 0 days   Brief narrative:   Bonnie Chavez is a 89 y.o. female with medical history significant for hypertension, history of TAVR, CKD stage III, type 2 diabetes mellitus presented to hospital with back spasms difficulty ambulating.  She had fall and was on the floor overnight when her caregiver saw her in the morning.  She complained of severe spasms in her back for couple of weeks and was taking Tylenol  and muscle relaxant.  In the ED patient had stable vitals.  Labs were notable for WBC at 11.3.  Hemoglobin 9.9, urinalysis was negative for infection.  Total CK of 115.  BMP showed creatinine of 1.1.  Lactate was elevated at 2.0.  EKG was abnormal and still troponin slightly elevated but flat.  Denies any chest pain.  Blood cultures were drawn and patient was admitted hospital for further evaluation and treatment.    Assessment/Plan: Principal Problem:   Weakness of both legs Active Problems:   DM2 (diabetes mellitus, type 2) (HCC)   Essential hypertension   (HFpEF) heart failure with preserved ejection fraction (HCC)   CKD stage 3b, GFR 30-44 ml/min (HCC)   S/P TAVR (transcatheter aortic valve replacement)   Acquired hypothyroidism  Generalized weakness/ fall  No evidence of infection normal UA chest x-ray.  Follow-up blood cultures.  Get PT/OT.  Tmax of 99.2 F.  Hold Lasix  for now  Abnormal EKG troponin flat.  ED provider had spoken with Dr. CLEMENTEEN Betters cardiology.  No concerns at this time.  Denies any chest pain.  Check 2D echocardiogram and if abnormal we will consider cardiology evaluation.  Intermittent confusion - Reported by her son.  Had been taking muscle relaxant.  This has been changed to bedtime and as needed.  Explained that muscle relaxant can cause confusion.    H/o TAVR -not on blood thinners.  On aspirin  at home.  Will add  Hypothyroidism.   Resume Synthroid ..  Essential hypertension-will resume Bystolic   Hyperlipidemia continue Crestor   DVT prophylaxis: enoxaparin  (LOVENOX ) injection 40 mg Start: 08/11/24 1900   Disposition: Uncertain at this time will get PT OT evaluation  Status is: Observation The patient will require care spanning > 2 midnights and should be moved to inpatient because: PT OT evaluation, ambulatory dysfunction    Code Status:     Code Status: Full Code  Family Communication: None at bedside  Consultants: None  Procedures: None  Anti-infectives:  None  Anti-infectives (From admission, onward)    None      Subjective: Today, patient was seen and examined at bedside.  Patient states that her lower back spasm is better now.  Unsure whether she can ambulatory or not.  Denies any nausea vomiting shortness of breath dyspnea cough chest pain or fever.  Objective: Vitals:   08/11/24 2348 08/12/24 0417  BP: (!) 138/59 (!) 157/88  Pulse: 67 69  Resp:  16  Temp: 97.9 F (36.6 C) 98.3 F (36.8 C)  SpO2: 96% 100%    Intake/Output Summary (Last 24 hours) at 08/12/2024 0958 Last data filed at 08/12/2024 0516 Gross per 24 hour  Intake 240 ml  Output 500 ml  Net -260 ml   Filed Weights   08/11/24 1002  Weight: 78.4 kg   Body mass index is 28.76 kg/m.   Physical Exam: GENERAL: Patient is alert awake and oriented. Not in obvious distress.  Elderly female, Communicative,  HENT: No scleral pallor or icterus. Pupils equally reactive to light. Oral mucosa is moist NECK: is supple, no gross swelling noted. CHEST: Clear to auscultation. No crackles or wheezes.  Diminished breath sounds bilaterally. CVS: S1 and S2 heard, no murmur. Regular rate and rhythm.  ABDOMEN: Soft, non-tender, bowel sounds are present.  Some spasm over the back. EXTREMITIES: No edema. CNS: Cranial nerves are intact.  Moves lower extremities. SKIN: warm and dry without rashes.  Data Review: I have personally  reviewed the following laboratory data and studies,  CBC: Recent Labs  Lab 08/11/24 1035 08/12/24 0536  WBC 11.3* 7.6  NEUTROABS 9.7*  --   HGB 9.9* 9.4*  HCT 29.6* 29.5*  MCV 96.1 100.0  PLT 223 203   Basic Metabolic Panel: Recent Labs  Lab 08/11/24 1035 08/12/24 0536  NA 135 141  K 3.9 3.8  CL 97* 104  CO2 25 25  GLUCOSE 251* 147*  BUN 17 13  CREATININE 1.11* 0.97  CALCIUM  11.0* 10.4*   Liver Function Tests: Recent Labs  Lab 08/11/24 1035  AST 35  ALT 17  ALKPHOS 72  BILITOT 0.5  PROT 8.2*  ALBUMIN 3.8   No results for input(s): LIPASE, AMYLASE in the last 168 hours. No results for input(s): AMMONIA in the last 168 hours. Cardiac Enzymes: Recent Labs  Lab 08/11/24 1035  CKTOTAL 115   BNP (last 3 results) Recent Labs    06/17/24 0221  BNP 894.9*    ProBNP (last 3 results) No results for input(s): PROBNP in the last 8760 hours.  CBG: Recent Labs  Lab 08/11/24 1009 08/11/24 1814 08/11/24 2055 08/12/24 0800  GLUCAP 229* 176* 227* 140*   Recent Results (from the past 240 hours)  Blood culture (routine x 2)     Status: None (Preliminary result)   Collection Time: 08/11/24 10:20 AM   Specimen: BLOOD  Result Value Ref Range Status   Specimen Description   Final    BLOOD RIGHT ANTECUBITAL Performed at [facility], [address], [place], [place] [address]    Special Requests   Final    BOTTLES DRAWN AEROBIC AND ANAEROBIC Blood Culture adequate volume   Culture   Final    NO GROWTH < 24 HOURS Performed at [facility], [address] [place], [place] [address]    Report Status PENDING  Incomplete  Blood culture (routine x 2)     Status: None (Preliminary result)   Collection Time: 08/11/24 10:25 AM   Specimen: BLOOD RIGHT FOREARM  Result Value Ref Range Status   Specimen Description BLOOD RIGHT FOREARM  Final   Special Requests   Final    BOTTLES DRAWN AEROBIC AND ANAEROBIC Blood Culture adequate  volume   Culture   Final    NO GROWTH < 24 HOURS Performed at [facility], [address], [place], [place] [address]    Report Status PENDING  Incomplete  SARS Coronavirus 2 by RT PCR (hospital order, performed in [facility] hospital lab) *cepheid single result test* Anterior Nasal Swab     Status: None   Collection Time: 08/11/24  7:00 PM   Specimen: Anterior Nasal Swab  Result Value Ref Range Status   SARS Coronavirus 2 by RT PCR NEGATIVE NEGATIVE Final    Comment: (NOTE) SARS-CoV-2 target nucleic acids are NOT DETECTED.  The SARS-CoV-2 RNA is generally detectable in upper and lower respiratory specimens during the acute phase of infection. The lowest concentration of  SARS-CoV-2 viral copies this assay can detect is 250 copies / mL. A negative result does not preclude SARS-CoV-2 infection and should not be used as the sole basis for treatment or other patient management decisions.  A negative result may occur with improper specimen collection / handling, submission of specimen other than nasopharyngeal swab, presence of viral mutation(s) within the areas targeted by this assay, and inadequate number of viral copies (<250 copies / mL). A negative result must be combined with clinical observations, patient history, and epidemiological information.  Fact Sheet for Patients:   RoadLapTop.co.za  Fact Sheet for Healthcare Providers: http://kim-miller.com/  This test is not yet approved or  cleared by the [place]  FDA and has been authorized for detection and/or diagnosis of SARS-CoV-2 by FDA under an Emergency Use Authorization (EUA).  This EUA will remain in effect (meaning this test can be used) for the duration of the COVID-19 declaration under Section 564(b)(1) of the Act, 21 U.S.C. section 360bbb-3(b)(1), unless the authorization is terminated or revoked sooner.  Performed at [facility], [address] [place], [place] [address]      Studies: CT Head Wo Contrast Result Date: 08/11/2024 CLINICAL DATA:  Mental status changes. EXAM: CT HEAD WITHOUT CONTRAST TECHNIQUE: Contiguous axial images were obtained from the base of the skull through the vertex without intravenous contrast. RADIATION DOSE REDUCTION: This exam was performed according to the departmental dose-optimization program which includes automated exposure control, adjustment of the mA and/or kV according to patient size and/or use of iterative reconstruction technique. COMPARISON:  None Available. FINDINGS: Brain: There is no evidence for acute hemorrhage, hydrocephalus, mass lesion, or abnormal extra-axial fluid collection. No definite CT evidence for acute infarction. Patchy low attenuation in the deep hemispheric and periventricular white matter is nonspecific, but likely reflects chronic microvascular ischemic demyelination. Vascular: No hyperdense vessel or unexpected calcification. Skull: No evidence for fracture. No worrisome lytic or sclerotic lesion. Sinuses/Orbits: The visualized paranasal sinuses and mastoid air cells are clear. Visualized portions of the globes and intraorbital fat are unremarkable. Other: None. IMPRESSION: 1. No acute intracranial abnormality. 2. Chronic small vessel ischemic disease. Electronically Signed   By: Camellia Candle M.D.   On: 08/11/2024 12:18   DG Chest Port 1 View Result Date: 08/11/2024 CLINICAL DATA:  Weakness EXAM: PORTABLE CHEST 1 VIEW COMPARISON:  07/17/2024 FINDINGS: Atherosclerotic calcification of the aortic arch. Mild enlargement of the cardiopericardial silhouette. TAVR noted. Thoracic spondylosis.  Bilateral proximal humeral prostheses. The lungs appear clear.  No blunting of the costophrenic angles. IMPRESSION: 1. Mild enlargement of the cardiopericardial silhouette, without edema. 2. Thoracic spondylosis. 3. Aortic Atherosclerosis (ICD10-I70.0). Electronically Signed   By: Ryan Salvage M.D.   On: 08/11/2024 12:17      Vernal Alstrom, MD  [facility] 08/12/2024  If 7PM-7AM, please contact night-coverage

## 2024-08-12 NOTE — Care Management Obs Status (Signed)
 MEDICARE OBSERVATION STATUS NOTIFICATION   Patient Details  Name: Bonnie Chavez MRN: [id number] Date of Birth: September 21, 1934   Medicare Observation Status Notification Given:  Yes    Zaydon Kinser I Davinder Haff, LCSW 08/12/2024, 4:04 PM

## 2024-08-12 NOTE — Evaluation (Signed)
 Occupational Therapy Evaluation Patient Details Name: Bonnie Chavez MRN: [id number] DOB: 10-13-1934 Today's Date: 08/12/2024   History of Present Illness   Bonnie Chavez is a 89 yr old female brought to the hospital after she slipped down to the floor while trying to charge her phone at home; her caregiver found her down the next morning. PMH: HTN, TAVR, CKD III, DM II, heart failure, chronic back pain, DDD, MVP, neuropathy, OA, L TKA, shoulder surgery     Clinical Impressions The pt is currently presenting with the below listed deficits (see OT problem list). During the session, she required SBA to CGA for tasks, including supine to sit, lower body dressing, sit to stand, and functional ambulation using a rolling walker. She will benefit from further OT services to maximize her independence with self-care tasks and other meaningful activities. OT recommends she return home at discharge, with the resumption of home health therapy and home health aide services.      If plan is discharge home, recommend the following:   Assistance with cooking/housework;Assist for transportation;A little help with bathing/dressing/bathroom     Functional Status Assessment   Patient has had a recent decline in their functional status and demonstrates the ability to make significant improvements in function in a reasonable and predictable amount of time.     Equipment Recommendations   None recommended by OT     Recommendations for Other Services         Precautions/Restrictions   Precautions Precautions: Fall Restrictions Weight Bearing Restrictions Per Provider Order: No     Mobility Bed Mobility Overal bed mobility: Needs Assistance Bed Mobility: Supine to Sit     Supine to sit: Supervision          Transfers Overall transfer level: Needs assistance Equipment used: Rolling walker (2 wheels) Transfers: Sit to/from Stand Sit to Stand: Contact guard assist            Balance     Sitting balance-Leahy Scale: Good         Standing balance comment: CGA with rolling walker          ADL either performed or assessed with clinical judgement   ADL Overall ADL's : Needs assistance/impaired Eating/Feeding: Independent;Sitting   Grooming: Set up;Contact guard assist;Sitting;Standing Grooming Details (indicate cue type and reason): set-up seated or CGA standing         Upper Body Dressing : Set up;Sitting   Lower Body Dressing: Set up;Supervision/safety;Sitting/lateral leans Lower Body Dressing Details (indicate cue type and reason): She doffed then donned her socks seated EOB. She implemented the figure 4 technique to perform. Toilet Transfer: Contact guard assist;Ambulation;Rolling walker (2 wheels);Grab bars   Toileting- Clothing Manipulation and Hygiene: Contact guard assist;Sit to/from stand               Vision Baseline Vision/History: 1 Wears glasses Additional Comments: She correctly read the time depicted on the wall clock.            Pertinent Vitals/Pain Pain Assessment Pain Assessment: No/denies pain     Extremity/Trunk Assessment Upper Extremity Assessment Upper Extremity Assessment: Right hand dominant;Overall [facility] for tasks assessed   Lower Extremity Assessment Lower Extremity Assessment: Overall WFL for tasks assessed       Communication Communication Communication: No apparent difficulties Factors Affecting Communication: Hearing impaired   Cognition Arousal: Alert Behavior During Therapy: WFL for tasks assessed/performed Cognition:  (Cognition appeared grossly age appropriate)  OT - Cognition Comments: Oriented x4          Following commands: Intact       Cueing  General Comments   Cueing Techniques: Verbal cues              Home Living Family/patient expects to be discharged to:: Private residence Living Arrangements: Alone Available Help at Discharge:  Family;Available PRN/intermittently;Personal care attendant Type of Home: Other(Comment) (Townhouse) Home Access: Level entry     Home Layout: One level               Home Equipment: Shower Counsellor (2 wheels);BSC/3in1;Rollator (4 wheels)   Additional Comments: Since returning home from SNF rehab in mid August, she has been receiving home health OT & PT. She has also had caregivers coming in Mon-Fri for a couple hours in the a.m. and a couple hours in the p.m. who assist with meal prep, bathing, cleaning, and checking her blood sugar. Her son also lives less than 5 minutes from her and he checks in on her regularly. 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      Prior Functioning/Environment Prior Level of Function : Independent/Modified Independent;Driving;Needs assist             Mobility Comments: Since she was discharged home from SNF rehab in mid August, she has been using a rollator. ADLs Comments: Prior to late July when she was hospitalized, she was modified independent to independent with ADLs, cooking, and driving.    OT Problem List: Decreased strength;Impaired balance (sitting and/or standing)   OT Treatment/Interventions: Self-care/ADL training;Therapeutic exercise;Neuromuscular education;Energy conservation;DME and/or AE instruction;Patient/family education;Therapeutic activities      OT Goals(Current goals can be found in the care plan section)   Acute Rehab OT Goals OT Goal Formulation: With patient Time For Goal Achievement: 08/25/24 Potential to Achieve Goals: Good ADL Goals Pt Will Perform Grooming: with modified independence;standing Pt Will Perform Lower Body Dressing: with modified independence;sit to/from stand;sitting/lateral leans Pt Will Transfer to Toilet: with modified independence;ambulating Pt Will Perform Toileting - Clothing Manipulation and hygiene: with modified independence;sit to/from stand   OT  Frequency:  Min 2X/week       AM-PAC OT 6 Clicks Daily Activity     Outcome Measure Help from another person eating meals?: None Help from another person taking care of personal grooming?: A Little Help from another person toileting, which includes using toliet, bedpan, or urinal?: A Little Help from another person bathing (including washing, rinsing, drying)?: A Little Help from another person to put on and taking off regular upper body clothing?: None Help from another person to put on and taking off regular lower body clothing?: A Little 6 Click Score: 20   End of Session Equipment Utilized During Treatment: Rolling walker (2 wheels) Nurse Communication: Mobility status  Activity Tolerance: Patient tolerated treatment well Patient left: in chair;with call bell/phone within reach;with chair alarm set  OT Visit Diagnosis: Unsteadiness on feet (R26.81);Muscle weakness (generalized) (M62.81)                Time: 8857-8784 OT Time Calculation (min): 33 min Charges:  OT General Charges $OT Visit: 1 Visit OT Evaluation $OT Eval Moderate Complexity: 1 Mod OT Treatments $Therapeutic Activity: 8-22 mins    Delanna JINNY Lesches, OTR/L 08/12/2024, 1:19 PM

## 2024-08-12 NOTE — Evaluation (Signed)
 Physical Therapy Evaluation Patient Details Name: Bonnie Chavez MRN: [id number] DOB: 1934-10-26 Today's Date: 08/12/2024  History of Present Illness  Bonnie Chavez is a 89 yr old female brought to the hospital after she slipped down to the floor while trying to charge her phone at home; her caregiver found her down the next morning. PMH: HTN, TAVR, CKD III, DM II, heart failure, chronic back pain, DDD, MVP, neuropathy, OA, L TKA, shoulder surgery  Clinical Impression  Pt admitted with above diagnosis.  Pt currently with functional limitations due to the deficits listed below (see PT Problem List). Pt will benefit from acute skilled PT to increase their independence and safety with mobility to allow discharge.  Pt requesting to use bathroom for urgent BM on arrival.  Pt able to ambulate to bathroom and perform pericare without assist.  Pt also ambulated in hallway.  Pt with some word finding difficulty end of session and also reports issues with memory/confusion lately (female visitor in room agrees with this).  Recommend pt resume HH services upon return home.         If plan is discharge home, recommend the following: Assistance with cooking/housework;Assist for transportation;Help with stairs or ramp for entrance;Direct supervision/assist for medications management;Direct supervision/assist for financial management;Supervision due to cognitive status   Can travel by private vehicle        Equipment Recommendations None recommended by PT  Recommendations for Other Services       Functional Status Assessment Patient has had a recent decline in their functional status and demonstrates the ability to make significant improvements in function in a reasonable and predictable amount of time.     Precautions / Restrictions Precautions Precautions: Fall      Mobility  Bed Mobility               General bed mobility comments: pt in recliner    Transfers Overall transfer  level: Needs assistance Equipment used: Rolling walker (2 wheels) Transfers: Sit to/from Stand Sit to Stand: Contact guard assist           General transfer comment: reliant on UEs to self assist    Ambulation/Gait Ambulation/Gait assistance: Contact guard assist Gait Distance (Feet): 160 Feet Assistive device: Rolling walker (2 wheels) Gait Pattern/deviations: Decreased stride length, Step-through pattern Gait velocity: decr     General Gait Details: steady with RW, no LE buckling observed, pt reports legs feel stronger today  Stairs            Wheelchair Mobility     Tilt Bed    Modified Rankin (Stroke Patients Only)       Balance Overall balance assessment: History of Falls         Standing balance support: No upper extremity supported Standing balance-Leahy Scale: Fair Standing balance comment: static fair, reliant on UE support for challenges and ambulation                             Pertinent Vitals/Pain Pain Assessment Pain Assessment: Faces Faces Pain Scale: Hurts a little bit Pain Location: back Pain Descriptors / Indicators: Sore, Spasm Pain Intervention(s): Repositioned, Monitored during session (declined new heat pack)    Home Living Family/patient expects to be discharged to:: Private residence Living Arrangements: Alone Available Help at Discharge: Family;Available PRN/intermittently;Personal care attendant Type of Home: Other(Comment) (Townhouse) Home Access: Level entry       Home Layout: One level Home Equipment: Shower seat;Rolling  Walker (2 wheels);BSC/3in1;Rollator (4 wheels) Additional Comments: Since returning home from SNF rehab in mid August, she has been receiving home health OT & PT. She has also had caregivers coming in Mon-Fri for a couple hours in the a.m. and a couple hours in the p.m. who assist with meal prep, bathing, cleaning, and checking her blood sugar. Her son also lives less than 5 minutes from  her and he checks in on her regularly. 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    Prior Function Prior Level of Function : Independent/Modified Independent;Driving;Needs assist             Mobility Comments: Since she was discharged home from SNF rehab in mid August, she has been using a rollator. ADLs Comments: Prior to late July when she was hospitalized, she was modified independent to independent with ADLs, cooking, and driving.     Extremity/Trunk Assessment   Upper Extremity Assessment Upper Extremity Assessment: Right hand dominant;Overall [facility] for tasks assessed    Lower Extremity Assessment Lower Extremity Assessment: Generalized weakness       Communication   Communication Communication: Impaired Factors Affecting Communication: Hearing impaired    Cognition Arousal: Alert Behavior During Therapy: WFL for tasks assessed/performed                           PT - Cognition Comments: presenting with difficulty word finding, also reports some issues lately with memory and confusion Following commands: Intact       Cueing Cueing Techniques: Verbal cues     General Comments      Exercises     Assessment/Plan    PT Assessment Patient needs continued PT services  PT Problem List Decreased strength;Decreased balance;Decreased mobility;Decreased knowledge of use of DME       PT Treatment Interventions Gait training;DME instruction;Balance training;Functional mobility training;Therapeutic exercise;Therapeutic activities;Patient/family education;Neuromuscular re-education    PT Goals (Current goals can be found in the Care Plan section)  Acute Rehab PT Goals PT Goal Formulation: With patient Time For Goal Achievement: 08/26/24 Potential to Achieve Goals: Good    Frequency Min 3X/week     Co-evaluation               AM-PAC PT 6 Clicks Mobility  Outcome Measure Help needed turning from your back to your side while  in a flat bed without using bedrails?: A Little Help needed moving from lying on your back to sitting on the side of a flat bed without using bedrails?: A Little Help needed moving to and from a bed to a chair (including a wheelchair)?: A Little Help needed standing up from a chair using your arms (e.g., wheelchair or bedside chair)?: A Little Help needed to walk in hospital room?: A Little Help needed climbing 3-5 steps with a railing? : A Lot 6 Click Score: 17    End of Session Equipment Utilized During Treatment: Gait belt Activity Tolerance: Patient tolerated treatment well Patient left: in chair;with call bell/phone within reach;with chair alarm set;with family/visitor present   PT Visit Diagnosis: Difficulty in walking, not elsewhere classified (R26.2);History of falling (Z91.81)    Time: 8592-8564 PT Time Calculation (min) (ACUTE ONLY): 28 min   Charges:   PT Evaluation $PT Eval Low Complexity: 1 Low PT Treatments $Gait Training: 8-22 mins PT General Charges $$ ACUTE PT VISIT: 1 Visit       Tari PT, DPT Physical Therapist Acute Rehabilitation  Services Office: [phone]   Tari LITTIE Farm 08/12/2024, 3:14 PM

## 2024-08-13 ENCOUNTER — Observation Stay (HOSPITAL_COMMUNITY)

## 2024-08-13 DIAGNOSIS — R29898 Other symptoms and signs involving the musculoskeletal system: Secondary | ICD-10-CM | POA: Diagnosis not present

## 2024-08-13 LAB — MAGNESIUM: Magnesium: 1.9 mg/dL (ref 1.7–2.4)

## 2024-08-13 LAB — CBC
HCT: 29.1 % — ABNORMAL LOW (ref 36.0–46.0)
Hemoglobin: 9.5 g/dL — ABNORMAL LOW (ref 12.0–15.0)
MCH: 32.6 pg (ref 26.0–34.0)
MCHC: 32.6 g/dL (ref 30.0–36.0)
MCV: 100 fL (ref 80.0–100.0)
Platelets: 213 K/uL (ref 150–400)
RBC: 2.91 MIL/uL — ABNORMAL LOW (ref 3.87–5.11)
RDW: 14.6 % (ref 11.5–15.5)
WBC: 11 K/uL — ABNORMAL HIGH (ref 4.0–10.5)
nRBC: 0 % (ref 0.0–0.2)

## 2024-08-13 LAB — BASIC METABOLIC PANEL WITH GFR
Anion gap: 11 (ref 5–15)
BUN: 14 mg/dL (ref 8–23)
CO2: 24 mmol/L (ref 22–32)
Calcium: 10 mg/dL (ref 8.9–10.3)
Chloride: 102 mmol/L (ref 98–111)
Creatinine, Ser: 0.94 mg/dL (ref 0.44–1.00)
GFR, Estimated: 58 mL/min — ABNORMAL LOW (ref >60)
Glucose, Bld: 173 mg/dL — ABNORMAL HIGH (ref 70–99)
Potassium: 3.8 mmol/L (ref 3.5–5.1)
Sodium: 137 mmol/L (ref 135–145)

## 2024-08-13 LAB — GLUCOSE, CAPILLARY
Glucose-Capillary: 157 mg/dL — ABNORMAL HIGH (ref 70–99)
Glucose-Capillary: 179 mg/dL — ABNORMAL HIGH (ref 70–99)
Glucose-Capillary: 252 mg/dL — ABNORMAL HIGH (ref 70–99)

## 2024-08-13 NOTE — Discharge Summary (Signed)
 Physician Discharge Summary  Bonnie Chavez FMW:[id number] DOB: May 20, 1934 DOA: 08/11/2024  PCP: Mercer Clotilda SAUNDERS, MD  Admit date: 08/11/2024 Discharge date: 08/13/2024  Admitted From: Home  Discharge disposition: Home with home health   Recommendations for Outpatient Follow-Up:   Follow up with your primary care provider in one week.  Check CBC, BMP, magnesium  in the next visit   Discharge Diagnosis:   Principal Problem:   Weakness of both legs Active Problems:   DM2 (diabetes mellitus, type 2) (HCC)   Essential hypertension   (HFpEF) heart failure with preserved ejection fraction (HCC)   CKD stage 3b, GFR 30-44 ml/min (HCC)   S/P TAVR (transcatheter aortic valve replacement)   Acquired hypothyroidism   Discharge Condition: Improved.  Diet recommendation: Low sodium, heart healthy.  Carbohydrate-modified.    Wound care: None.  Code status: Full.   History of Present Illness:   Bonnie Chavez is a 89 y.o. female with medical history significant for hypertension, history of TAVR, CKD stage III, type 2 diabetes mellitus presented to hospital with back spasms difficulty ambulating.  She had fall and was on the floor overnight when her caregiver saw her in the morning.  She complained of severe spasms in her back for couple of weeks and was taking Tylenol  and muscle relaxant.  In the ED patient had stable vitals.  Labs were notable for WBC at 11.3.  Hemoglobin 9.9, urinalysis was negative for infection.  Total CK of 115.  BMP showed creatinine of 1.1.  Lactate was elevated at 2.0.  EKG was abnormal and still troponin slightly elevated but flat.  Denies any chest pain.  Blood cultures were drawn and patient was admitted hospital for further evaluation and treatment.    Hospital Course:   Following conditions were addressed during hospitalization as listed below,  Generalized weakness/ fall  No evidence of infection, normal UA chest x-ray.  Cultures negative  in 2 days..  Patient has been seen by physical therapy and Occupational Therapy and recommend home health PT OT on discharge.  Patient was advised against muscle relaxant.   Abnormal EKG troponin flat.  ED provider had spoken with Dr. CLEMENTEEN Betters cardiology.  No concerns at this time.     Intermittent confusion - Reported by her son.  Had been taking muscle relaxant.  Will discontinue muscle relaxant on discharge.  Patient has already been starting to feel better on her back.   H/o TAVR -not on blood thinners.  On aspirin  at home.    Hypothyroidism.  Continue Synthroid    Essential hypertension-continue Bystolic    Hyperlipidemia continue Crestor    Disposition.  At this time, patient is stable for disposition home with home health.  Medical Consultants:   None.  Procedures:    None Subjective:   Today, patient was seen and examined at bedside.  Denies any nausea, vomiting, fever, dizziness, lightheadedness.  Complains of mild right hip pain.  Discharge Exam:   Vitals:   08/13/24 0552 08/13/24 1401  BP: (!) 155/55 (!) 169/60  Pulse: 70 66  Resp: 18 18  Temp: 98.6 F (37 C) 98.3 F (36.8 C)  SpO2: 98% 100%   Vitals:   08/12/24 1241 08/12/24 2116 08/13/24 0552 08/13/24 1401  BP: 133/64 (!) 138/52 (!) 155/55 (!) 169/60  Pulse: 70 72 70 66  Resp: 17 16 18 18   Temp: 97.9 F (36.6 C) 98.4 F (36.9 C) 98.6 F (37 C) 98.3 F (36.8 C)  TempSrc: Oral   Oral  SpO2: 100% 100% 98% 100%  Weight:      Height:       Body mass index is 28.76 kg/m.  General: Alert awake, not in obvious distress, elderly female, HENT: pupils equally reacting to light,  No scleral pallor or icterus noted. Oral mucosa is moist.  Chest:  Clear breath sounds.   No crackles or wheezes.  CVS: S1 &S2 heard. No murmur.  Regular rate and rhythm. Abdomen: Soft, nontender, nondistended.  Bowel sounds are heard.   Extremities: No cyanosis, clubbing or edema.  Peripheral pulses are palpable. Psych: Alert,  awake and oriented, normal mood CNS:  No cranial nerve deficits.  Power equal in all extremities.   Skin: Warm and dry.  No rashes noted.  The results of significant diagnostics from this hospitalization (including imaging, microbiology, ancillary and laboratory) are listed below for reference.     Diagnostic Studies:   CT Head Wo Contrast Result Date: 08/11/2024 CLINICAL DATA:  Mental status changes. EXAM: CT HEAD WITHOUT CONTRAST TECHNIQUE: Contiguous axial images were obtained from the base of the skull through the vertex without intravenous contrast. RADIATION DOSE REDUCTION: This exam was performed according to the departmental dose-optimization program which includes automated exposure control, adjustment of the mA and/or kV according to patient size and/or use of iterative reconstruction technique. COMPARISON:  None Available. FINDINGS: Brain: There is no evidence for acute hemorrhage, hydrocephalus, mass lesion, or abnormal extra-axial fluid collection. No definite CT evidence for acute infarction. Patchy low attenuation in the deep hemispheric and periventricular white matter is nonspecific, but likely reflects chronic microvascular ischemic demyelination. Vascular: No hyperdense vessel or unexpected calcification. Skull: No evidence for fracture. No worrisome lytic or sclerotic lesion. Sinuses/Orbits: The visualized paranasal sinuses and mastoid air cells are clear. Visualized portions of the globes and intraorbital fat are unremarkable. Other: None. IMPRESSION: 1. No acute intracranial abnormality. 2. Chronic small vessel ischemic disease. Electronically Signed   By: Camellia Candle M.D.   On: 08/11/2024 12:18   DG Chest Port 1 View Result Date: 08/11/2024 CLINICAL DATA:  Weakness EXAM: PORTABLE CHEST 1 VIEW COMPARISON:  07/17/2024 FINDINGS: Atherosclerotic calcification of the aortic arch. Mild enlargement of the cardiopericardial silhouette. TAVR noted. Thoracic spondylosis.  Bilateral  proximal humeral prostheses. The lungs appear clear.  No blunting of the costophrenic angles. IMPRESSION: 1. Mild enlargement of the cardiopericardial silhouette, without edema. 2. Thoracic spondylosis. 3. Aortic Atherosclerosis (ICD10-I70.0). Electronically Signed   By: Ryan Salvage M.D.   On: 08/11/2024 12:17     Labs:   Basic Metabolic Panel: Recent Labs  Lab 08/11/24 1035 08/12/24 0536 08/13/24 0600  NA 135 141 137  K 3.9 3.8 3.8  CL 97* 104 102  CO2 25 25 24   GLUCOSE 251* 147* 173*  BUN 17 13 14   CREATININE 1.11* 0.97 0.94  CALCIUM  11.0* 10.4* 10.0  MG  --   --  1.9   GFR Estimated Creatinine Clearance: 42.8 mL/min (by C-G formula based on SCr of 0.94 mg/dL). Liver Function Tests: Recent Labs  Lab 08/11/24 1035  AST 35  ALT 17  ALKPHOS 72  BILITOT 0.5  PROT 8.2*  ALBUMIN 3.8   No results for input(s): LIPASE, AMYLASE in the last 168 hours. No results for input(s): AMMONIA in the last 168 hours. Coagulation profile No results for input(s): INR, PROTIME in the last 168 hours.  CBC: Recent Labs  Lab 08/11/24 1035 08/12/24 0536 08/13/24 0600  WBC 11.3* 7.6 11.0*  NEUTROABS 9.7*  --   --  HGB 9.9* 9.4* 9.5*  HCT 29.6* 29.5* 29.1*  MCV 96.1 100.0 100.0  PLT 223 203 213   Cardiac Enzymes: Recent Labs  Lab 08/11/24 1035  CKTOTAL 115   BNP: Invalid input(s): POCBNP CBG: Recent Labs  Lab 08/12/24 1136 08/12/24 1618 08/12/24 2118 08/13/24 0731 08/13/24 1233  GLUCAP 240* 181* 223* 157* 179*   D-Dimer No results for input(s): DDIMER in the last 72 hours. Hgb A1c No results for input(s): HGBA1C in the last 72 hours. Lipid Profile No results for input(s): CHOL, HDL, LDLCALC, TRIG, CHOLHDL, LDLDIRECT in the last 72 hours. Thyroid  function studies Recent Labs    08/12/24 0536  TSH 0.709   Anemia work up No results for input(s): VITAMINB12, FOLATE, FERRITIN, TIBC, IRON, RETICCTPCT in the last 72  hours. Microbiology Recent Results (from the past 240 hours)  Blood culture (routine x 2)     Status: None (Preliminary result)   Collection Time: 08/11/24 10:20 AM   Specimen: BLOOD  Result Value Ref Range Status   Specimen Description   Final    BLOOD RIGHT ANTECUBITAL Performed at [facility], [address] [place], [place] [address]    Special Requests   Final    BOTTLES DRAWN AEROBIC AND ANAEROBIC Blood Culture adequate volume   Culture   Final    NO GROWTH 2 DAYS Performed at [facility], [address] [place], [place] [address]    Report Status PENDING  Incomplete  Blood culture (routine x 2)     Status: None (Preliminary result)   Collection Time: 08/11/24 10:25 AM   Specimen: BLOOD RIGHT FOREARM  Result Value Ref Range Status   Specimen Description BLOOD RIGHT FOREARM  Final   Special Requests   Final    BOTTLES DRAWN AEROBIC AND ANAEROBIC Blood Culture adequate volume   Culture   Final    NO GROWTH 2 DAYS Performed at [facility], [address], [place], [place] [address]    Report Status PENDING  Incomplete  SARS Coronavirus 2 by RT PCR (hospital order, performed in [facility] hospital lab) *cepheid single result test* Anterior Nasal Swab     Status: None   Collection Time: 08/11/24  7:00 PM   Specimen: Anterior Nasal Swab  Result Value Ref Range Status   SARS Coronavirus 2 by RT PCR NEGATIVE NEGATIVE Final    Comment: (NOTE) SARS-CoV-2 target nucleic acids are NOT DETECTED.  The SARS-CoV-2 RNA is generally detectable in upper and lower respiratory specimens during the acute phase of infection. The lowest concentration of SARS-CoV-2 viral copies this assay can detect is 250 copies / mL. A negative result does not preclude SARS-CoV-2 infection and should not be used as the sole basis for treatment or other patient management decisions.  A negative result may occur with improper specimen collection / handling, submission of  specimen other than nasopharyngeal swab, presence of viral mutation(s) within the areas targeted by this assay, and inadequate number of viral copies (<250 copies / mL). A negative result must be combined with clinical observations, patient history, and epidemiological information.  Fact Sheet for Patients:   RoadLapTop.co.za  Fact Sheet for Healthcare Providers: http://kim-miller.com/  This test is not yet approved or  cleared by the [place]  FDA and has been authorized for detection and/or diagnosis of SARS-CoV-2 by FDA under an Emergency Use Authorization (EUA).  This EUA will remain in effect (meaning this test can be used) for the duration of the  COVID-19 declaration under Section 564(b)(1) of the Act, 21 U.S.C. section 360bbb-3(b)(1), unless the authorization is terminated or revoked sooner.  Performed at [facility], [address], [place], [place] [address]      Discharge Instructions:   Discharge Instructions     Diet Carb Modified   Complete by: As directed    Discharge instructions   Complete by: As directed    Follow-up with your primary care provider in 1 week.  Check blood work at that time.  Continue physical therapy at home.  Do not take muscle relaxants if possible.   Increase activity slowly   Complete by: As directed       Allergies as of 08/13/2024       Reactions   Lisinopril  Hives, Other (See Comments), Cough   And lip edema   Ibuprofen Other (See Comments)   Reaction not recalled by the patient    Nsaids Other (See Comments)   Abnormal renal function   Latex Rash   Penicillins Rash, Other (See Comments)   Pt has taken Keflex without difficulty Has patient had a PCN reaction causing immediate rash, facial/tongue/throat swelling, SOB or lightheadedness with hypotension: yes        Medication List     STOP taking these medications    methocarbamol  750 MG  tablet Commonly known as: ROBAXIN        TAKE these medications    acetaminophen  500 MG tablet Commonly known as: TYLENOL  Take 500 mg by mouth every 6 (six) hours.   aspirin  EC 81 MG tablet Take 81 mg by mouth at bedtime. Swallow whole.   BLUE-EMU HEMP EX Apply 1 application  topically daily as needed (pain- affected sites).   cinacalcet  30 MG tablet Commonly known as: SENSIPAR  Take 30 mg by mouth See admin instructions. Take 30 mg by mouth in the morning on Mon/Tues/Wed/Thurs/Fri only   feeding supplement (GLUCERNA SHAKE) Liqd Take 237 mLs by mouth 3 (three) times daily between meals. What changed: when to take this   furosemide  20 MG tablet Commonly known as: LASIX  Take 40 mg by mouth every morning.   HumuLIN 70/30 KwikPen (70-30) 100 UNIT/ML KwikPen Generic drug: insulin  isophane & regular human KwikPen Inject 6-8 Units into the skin See admin instructions. Inject 6 units into the skin the morning and 8 units in the evening   levothyroxine  50 MCG tablet Commonly known as: SYNTHROID  Take 50 mcg by mouth daily before breakfast.   nebivolol  10 MG tablet Commonly known as: BYSTOLIC  Take 10 mg by mouth in the morning. What changed: Another medication with the same name was changed. Make sure you understand how and when to take each.   nebivolol  5 MG tablet Commonly known as: BYSTOLIC  Take 1 tablet (5 mg total) by mouth daily. What changed: when to take this   pantoprazole  40 MG tablet Commonly known as: PROTONIX  Take 1 tablet (40 mg total) by mouth daily.   polyethylene glycol 17 g packet Commonly known as: MIRALAX  / GLYCOLAX  Take 17 g by mouth daily.   rosuvastatin  40 MG tablet Commonly known as: CRESTOR  Take 40 mg by mouth every evening.   senna-docusate 8.6-50 MG tablet Commonly known as: Senokot-S Take 1 tablet by mouth 2 (two) times daily.        Follow-up Information     Mercer Clotilda SAUNDERS, MD Follow up in 1 week(s).   Specialty: Family  Medicine Contact information: [address] [place] [address] [phone]  Time coordinating discharge: 39 minutes  Signed:  Dontavis Tschantz  [facility] 08/13/2024, 3:38 PM

## 2024-08-13 NOTE — TOC Transition Note (Signed)
 Transition of Care [facility]) - Discharge Note   Patient Details  Name: Bonnie Chavez MRN: [id number] Date of Birth: 1934/03/19  [facility]) CM/SW Contact:  Jon ONEIDA Anon, RN Phone Number: 08/13/2024, 4:04 PM   Clinical Narrative:    Pt will discharge home with home health services. Pt receiving HH PT/OT services through [facility] prior to admission. HHPT/OT orders are in place. NCM spoke with pt son Jezabella Schriever and he states he will provide transportation at discharge. There are no other ICM needs at this time.       Final next level of care: Home w Home Health Services Barriers to Discharge: No Barriers Identified   Patient Goals and CMS Choice Patient states their goals for this hospitalization and ongoing recovery are:: To return home with home health services CMS Medicare.gov Compare Post Acute Care list provided to:: Patient Choice offered to / list presented to : Patient [place] ownership interest in [facility].provided to:: Patient    Discharge Placement                  Name of family member notified: Camiya, [place])  [phone] Patient and family notified of of transfer: 08/13/24  Discharge Plan and Services Additional resources added to the After Visit Summary for                  DME Arranged: N/A DME Agency: NA       HH Arranged: PT, OT HH Agency: [facility] Date [facility] Agency Contacted: 08/13/24 Time HH Agency Contacted: [phone] Representative spoke with at [facility] Agency: [place]  Social Drivers of Health (SDOH) Interventions SDOH Screenings   Food Insecurity: No Food Insecurity (08/11/2024)  Housing: High Risk (08/11/2024)  Transportation Needs: No Transportation Needs (08/11/2024)  Utilities: Not At Risk (08/11/2024)  Alcohol Screen: Low Risk  (11/06/2022)  Depression (PHQ2-9): Low Risk  (04/03/2024)  Financial Resource Strain: Low Risk  (11/06/2022)  Physical Activity: Insufficiently Active  (11/06/2022)  Social Connections: Moderately Integrated (08/11/2024)  Stress: No Stress Concern Present (11/06/2022)  Tobacco Use: Low Risk  (08/11/2024)     Readmission Risk Interventions     No data to display

## 2024-08-14 NOTE — Telephone Encounter (Signed)
 Faxed

## 2024-08-15 NOTE — Telephone Encounter (Signed)
 Re-faxed 9/23 9:33 am

## 2024-08-15 NOTE — Telephone Encounter (Signed)
 Grayce from [facility] called stating the fax was never received and would like to request document be re-faxed.    FAX: [phone]

## 2024-08-16 ENCOUNTER — Telehealth: Payer: Self-pay | Admitting: *Deleted

## 2024-08-16 LAB — CULTURE, BLOOD (ROUTINE X 2)
Culture: NO GROWTH
Culture: NO GROWTH
Special Requests: ADEQUATE
Special Requests: ADEQUATE

## 2024-08-16 NOTE — Telephone Encounter (Signed)
 Copied from CRM #[id number]. Topic: Clinical - Medical Advice >> Aug 16, 2024 12:52 PM Harlene ORN wrote: Reason for CRM: Bonnie Chavez [facility] Family is reporting a fall that happened Thursday night, and was discovered Friday. No injuries, but she was ultimately transferred to [facility]. For any more questions for Home Health:  Phone: [phone]

## 2024-08-18 ENCOUNTER — Telehealth: Payer: Self-pay

## 2024-08-18 ENCOUNTER — Encounter: Payer: Self-pay | Admitting: Family Medicine

## 2024-08-18 ENCOUNTER — Ambulatory Visit: Admitting: Family Medicine

## 2024-08-18 VITALS — BP 144/72 | HR 75 | Temp 98.2°F | Ht 65.0 in | Wt 168.2 lb

## 2024-08-18 DIAGNOSIS — N1832 Chronic kidney disease, stage 3b: Secondary | ICD-10-CM

## 2024-08-18 DIAGNOSIS — R410 Disorientation, unspecified: Secondary | ICD-10-CM

## 2024-08-18 DIAGNOSIS — E1122 Type 2 diabetes mellitus with diabetic chronic kidney disease: Secondary | ICD-10-CM

## 2024-08-18 DIAGNOSIS — R29898 Other symptoms and signs involving the musculoskeletal system: Secondary | ICD-10-CM

## 2024-08-18 DIAGNOSIS — Z794 Long term (current) use of insulin: Secondary | ICD-10-CM

## 2024-08-18 DIAGNOSIS — Z09 Encounter for follow-up examination after completed treatment for conditions other than malignant neoplasm: Secondary | ICD-10-CM | POA: Diagnosis not present

## 2024-08-18 DIAGNOSIS — I10 Essential (primary) hypertension: Secondary | ICD-10-CM

## 2024-08-18 DIAGNOSIS — I5032 Chronic diastolic (congestive) heart failure: Secondary | ICD-10-CM

## 2024-08-18 NOTE — Telephone Encounter (Signed)
 Called Bonnie Chavez (Acupuncturist) , VO given per Dr. Mercer

## 2024-08-18 NOTE — Progress Notes (Signed)
 Established Patient Office Visit   Subjective  Patient ID: Bonnie Chavez, female    DOB: 1934/07/04  Age: 89 y.o. MRN: [id number]  Chief Complaint  Patient presents with   Hospitalization Follow-up    HPI  Pt is an 89 yo female with PMH SIG for HTN, s/p TAVR, CKD 3, DM 2 who was seen for HFU.  Pt hospitalized 08/11/2024-08/13/2024 for generalized weakness after falling at home and remaining on the floor overnight after being unable to get up.  Patient endorses severe spasms in the back times weeks for which she was taking Tylenol  and muscle relaxer.  NAD vital stable.  White count 11.3, hemoglobin 9.9, UA negative,, total CK 115, creatinine 1.1, lactate elevated 2.0.  EKG normal.  CXR normal and cultures negative x 2 days.  PT and OT recommended via home health at discharge.  Muscle relaxer discontinued.  Prior to this hospitalization patient was hospitalized 7/25-7/31/2025 for urosepsis.  Antibiotics were completed and patient was given Robaxin  500 mg Q8 as needed for muscle spasms.  At time of last OFV on 07/17/2024 Robaxin  was taking the edge off.  Dose was increased.  Blood pressures at home 131/67, 139/75, 122/62, 123/62, 124/68, 150/69, 132/60, 142/74.  Blood sugars 143, 160, 184, 156, 215, 206, 197, 156, 181, 149.   Patient Active Problem List   Diagnosis Date Noted   Weakness of both legs 08/11/2024   Severe sepsis (HCC) 06/17/2024   Acute cystitis 06/17/2024   Acquired hypothyroidism 06/17/2024   Generalized weakness 06/16/2024   1st degree AV block 10/06/2023   S/P TAVR (transcatheter aortic valve replacement) 10/05/2023   Severe aortic stenosis    Carpal tunnel syndrome    Neuropathy    (HFpEF) heart failure with preserved ejection fraction (HCC)    CKD stage 3b, GFR 30-44 ml/min (HCC)    Dysfunction of right eustachian tube 09/17/2020   Excessive cerumen in ear canal, bilateral 02/01/2018   Sensorineural hearing loss (SNHL), bilateral 01/19/2017    Hyperparathyroidism 02/27/2016   Chronic low back pain 02/27/2016   Morbid obesity (HCC) 02/27/2016   Chronic diastolic CHF (congestive heart failure) (HCC) 10/28/2015   Chronic kidney disease 05/24/2014   Essential hypertension 08/21/2013   HLD (hyperlipidemia) 08/21/2013   PMB (postmenopausal bleeding) 05/15/2013   Multiple thyroid  nodules 03/09/2013   Vitamin D  deficiency 03/09/2013   Osteoarthritis 02/06/2010   Vaginal atrophy 01/12/2002    Class: History of   DM2 (diabetes mellitus, type 2) (HCC) 09/07/2001    Class: History of   Past Medical History:  Diagnosis Date   (HFpEF) heart failure with preserved ejection fraction (HCC)    Anemia    History of , resolved   Carpal tunnel syndrome    Chronic low back pain 02/27/2016   -DDD -seeing [facility]    CKD stage 3b, GFR 30-44 ml/min (HCC)    Diabetes mellitus    type 2, sees Dr. Tommas   Goiter    Dr. Tommas   History of blood transfusion    auto transfusion post knee replacement   Hyperlipidemia    Hyperparathyroidism 02/27/2016   -sees endocrinologist, Dr. Tommas, has seen surgeon as well    Hypertension    Migraine    no longer has migraines (80's) after menopause   Mitral valve prolapse    Neuropathy    Osteoarthritis    DDD, sees [facility]   PMB (postmenopausal bleeding) 02/28/2009   Resolved   S/P TAVR (transcatheter aortic valve replacement)  10/05/2023   s/p TAVR with a 23 mm Edwards S3UR via the TF approach by Dr. Wonda and Lucas   Severe aortic stenosis    Vaginal atrophy    Vitamin D  deficiency    Past Surgical History:  Procedure Laterality Date   CARPAL TUNNEL RELEASE Right 04/30/2023   Procedure: RIGHT CARPAL TUNNEL RELEASE;  Surgeon: Murrell Drivers, MD;  Location: [facility] OR;  Service: Orthopedics;  Laterality: Right;  30 MIN   CESAREAN SECTION  1972 &1976   CHOLECYSTECTOMY  1991   COLONOSCOPY     CORONARY ANGIOGRAPHY N/A 08/16/2023   Procedure: CORONARY ANGIOGRAPHY;  Surgeon: Wonda Sharper, MD;  Location: [facility] INVASIVE CV LAB;  Service: Cardiovascular;  Laterality: N/A;   DILATATION & CURRETTAGE/HYSTEROSCOPY WITH RESECTOCOPE N/A 05/15/2013   Procedure: DILATATION & CURETTAGE/HYSTEROSCOPY WITH RESECTOCOPE;  Surgeon: Shanda SHAUNNA Muscat, MD;  Location: [facility] ORS;  Service: Gynecology;  Laterality: N/A;   DILATATION & CURRETTAGE/HYSTEROSCOPY WITH RESECTOCOPE N/A 06/03/2017   Procedure: DILATATION & CURETTAGE/HYSTEROSCOPY;  Surgeon: Muscat Shanda SHAUNNA, MD;  Location: [facility] ORS;  Service: Gynecology;  Laterality: N/A;   DILATION AND CURETTAGE OF UTERUS  1975   HEMORRHOID SURGERY  1997   KNEE SURGERY  2000 2011   right knee replacement    REPLACEMENT TOTAL KNEE  2000   left   SHOULDER SURGERY  2004 2010    TRANSTHORACIC ECHOCARDIOGRAM  07/2010   EF=>55%; LA mildly dilated; trace MR/TR;   TUBAL LIGATION  1976   Social History   Tobacco Use   Smoking status: Never   Smokeless tobacco: Never  Vaping Use   Vaping status: Never Used  Substance Use Topics   Alcohol use: No    Alcohol/week: 0.0 standard drinks of alcohol   Drug use: No   Family History  Problem Relation Age of Onset   Hypertension Mother    Diabetes Mother    Hyperlipidemia Mother    Heart attack Mother    Heart disease Father    Heart attack Father    Stroke Brother    Heart disease Brother    Diabetes Brother    Uterine cancer Maternal Grandmother    Allergies  Allergen Reactions   Lisinopril  Hives, Other (See Comments) and Cough    And lip edema    Ibuprofen Other (See Comments)    Reaction not recalled by the patient    Nsaids Other (See Comments)    Abnormal renal function   Latex Rash   Penicillins Rash and Other (See Comments)    Pt has taken Keflex without difficulty Has patient had a PCN reaction causing immediate rash, facial/tongue/throat swelling, SOB or lightheadedness with hypotension: yes      ROS Negative unless stated above    Objective:     BP (!) 144/72 (BP Location:  Left Arm, Patient Position: Sitting, Cuff Size: Large)   Pulse 75   Temp 98.2 F (36.8 C) (Oral)   Ht 5' 5 (1.651 m)   Wt 168 lb 3.2 oz (76.3 kg)   SpO2 99%   BMI 27.99 kg/m  BP Readings from Last 3 Encounters:  08/18/24 (!) 144/72  08/13/24 (!) 169/60  07/17/24 (!) 164/82   Wt Readings from Last 3 Encounters:  08/18/24 168 lb 3.2 oz (76.3 kg)  08/11/24 172 lb 13.5 oz (78.4 kg)  07/17/24 175 lb 3.2 oz (79.5 kg)      Physical Exam Constitutional:      General: She is not in acute distress.  Appearance: Normal appearance.  HENT:     Head: Normocephalic and atraumatic.     Nose: Nose normal.     Mouth/Throat:     Mouth: Mucous membranes are moist.  Cardiovascular:     Rate and Rhythm: Normal rate and regular rhythm.     Heart sounds: Normal heart sounds. No murmur heard.    No gallop.  Pulmonary:     Effort: Pulmonary effort is normal. No respiratory distress.     Breath sounds: Normal breath sounds. No wheezing, rhonchi or rales.  Skin:    General: Skin is warm and dry.  Neurological:     Mental Status: She is alert and oriented to person, place, and time.        04/03/2024    1:48 PM 02/21/2024    8:36 AM 02/18/2024    1:26 PM  Depression screen PHQ 2/9  Decreased Interest 0 0 0  Down, Depressed, Hopeless 0 0 0  PHQ - 2 Score 0 0 0  Altered sleeping 0 0 0  Tired, decreased energy 0 0 0  Change in appetite 0 0 0  Feeling bad or failure about yourself  0 0 0  Trouble concentrating 0 0 0  Moving slowly or fidgety/restless 0 0 0  Suicidal thoughts 0 0 0  PHQ-9 Score 0 0 0  Difficult doing work/chores   Not difficult at all      04/03/2024    1:48 PM 02/21/2024    8:37 AM 01/20/2024   10:33 AM 09/08/2023    9:48 AM  GAD 7 : Generalized Anxiety Score  Nervous, Anxious, on Edge 0 0 0 0  Control/stop worrying 0 0 0 0  Worry too much - different things 0 0 0 0  Trouble relaxing 0 0 0 0  Restless 0 0 0 0  Easily annoyed or irritable 0 0 0 0  Afraid -  awful might happen 0 0 0 0  Total GAD 7 Score 0 0 0 0  Anxiety Difficulty   Not difficult at all Not difficult at all     No results found for any visits on 08/18/24.    Assessment & Plan:   Hospital discharge follow-up  Weakness of both legs -     COMPLETE METABOLIC PANEL WITHOUT GFR; Future  Type 2 diabetes mellitus with stage 3b chronic kidney disease, with long-term current use of insulin  (HCC)  Essential hypertension -     COMPLETE METABOLIC PANEL WITHOUT GFR; Future  Stage 3b chronic kidney disease (HCC) -     COMPLETE METABOLIC PANEL WITHOUT GFR; Future  Chronic diastolic CHF (congestive heart failure) (HCC) -     COMPLETE METABOLIC PANEL WITHOUT GFR; Future  Confusion -     COMPLETE METABOLIC PANEL WITHOUT GFR; Future  Hospital notes, labs, imaging reviewed.   medications updated and refills provided.  Will slowly add back medications as needed.  Continue to monitor BP at home.  HH to assist.  DM controlled.  A1c 7.8% on 06/18/2024.  Repeat CMP.  Confusion should began improving as msk relaxer gets out of her system.  For continued confusion or periods of weakness discussed repeating imaging as may be having TIAs.  Maximize medical therapy.  Continue statin.  Bp and BS control.  Return in about 3 months (around 11/17/2024), or if symptoms worsen or fail to improve.   Clotilda JONELLE Single, MD

## 2024-08-18 NOTE — Telephone Encounter (Signed)
 Copied from CRM #[id number]. Topic: Clinical - Home Health Verbal Orders >> Aug 18, 2024 10:57 AM [place] J wrote: Caller/Agency: Zacharia (Acupuncturist) / [facility] Callback Number: [phone] Service Requested: Social work evaluation. Effective the week of 9/28 Frequency: N/A Any new concerns about the patient? Yes Patient's son Kenise Barraco) would like information about inpatient rehab.

## 2024-08-21 ENCOUNTER — Other Ambulatory Visit (INDEPENDENT_AMBULATORY_CARE_PROVIDER_SITE_OTHER)

## 2024-08-21 DIAGNOSIS — Z09 Encounter for follow-up examination after completed treatment for conditions other than malignant neoplasm: Secondary | ICD-10-CM | POA: Diagnosis not present

## 2024-08-21 LAB — COMPLETE METABOLIC PANEL WITHOUT GFR
AG Ratio: 0.9 (calc) — ABNORMAL LOW (ref 1.0–2.5)
ALT: 24 U/L (ref 6–29)
AST: 32 U/L (ref 10–35)
Albumin: 3.5 g/dL — ABNORMAL LOW (ref 3.6–5.1)
Alkaline phosphatase (APISO): 61 U/L (ref 37–153)
BUN/Creatinine Ratio: 13 (calc) (ref 6–22)
BUN: 14 mg/dL (ref 7–25)
CO2: 29 mmol/L (ref 20–32)
Calcium: 9.9 mg/dL (ref 8.6–10.4)
Chloride: 100 mmol/L (ref 98–110)
Creat: 1.07 mg/dL — ABNORMAL HIGH (ref 0.60–0.95)
Globulin: 4 g/dL — ABNORMAL HIGH (ref 1.9–3.7)
Glucose, Bld: 223 mg/dL — ABNORMAL HIGH (ref 65–99)
Potassium: 3.6 mmol/L (ref 3.5–5.3)
Sodium: 137 mmol/L (ref 135–146)
Total Bilirubin: 0.3 mg/dL (ref 0.2–1.2)
Total Protein: 7.5 g/dL (ref 6.1–8.1)

## 2024-08-22 ENCOUNTER — Ambulatory Visit: Payer: Self-pay | Admitting: Family Medicine

## 2024-08-28 ENCOUNTER — Telehealth: Payer: Self-pay | Admitting: *Deleted

## 2024-08-28 DIAGNOSIS — R29898 Other symptoms and signs involving the musculoskeletal system: Secondary | ICD-10-CM

## 2024-08-28 DIAGNOSIS — I5032 Chronic diastolic (congestive) heart failure: Secondary | ICD-10-CM

## 2024-08-28 NOTE — Telephone Encounter (Signed)
 Copied from CRM [id number]. Topic: Clinical - Order For Equipment >> Aug 28, 2024  1:20 PM Pinkey ORN wrote: Reason for CRM: Equipment Request >> Aug 28, 2024  1:21 PM Pinkey ORN wrote: Bonnie Chavez Social Worker [phone] Called on behalf of patient, states patient is requesting 3-1 bedside commode.

## 2024-08-28 NOTE — Telephone Encounter (Signed)
 Spoke with Edsel Molt Social Worker, per Dr. Mercer order has been placed and sent to [facility]

## 2024-09-11 ENCOUNTER — Telehealth: Payer: Self-pay

## 2024-09-11 ENCOUNTER — Ambulatory Visit: Admitting: Family Medicine

## 2024-09-11 NOTE — Telephone Encounter (Signed)
 Copied from CRM [id number]. Topic: Clinical - Home Health Verbal Orders >> Sep 08, 2024  2:44 PM Tinnie BROCKS wrote: Caller/Agency: Asharia with [facility] Callback Number: [phone] Service Requested: Occupational Therapy Frequency: 1 time a week for 4 weeks, phone call one time a week for 2 weeks Any new concerns about the patient? No

## 2024-09-11 NOTE — Telephone Encounter (Signed)
 Called and left a VM Asharia with [facility]  per Dr. Mercer VO was given, for O.T.

## 2024-09-11 NOTE — Telephone Encounter (Signed)
 Copied from CRM #[id number]. Topic: Clinical - Home Health Verbal Orders >> Sep 07, 2024  3:30 PM Robinson H wrote: Caller/Agency: Laura/[facility] Callback Number: [phone] Secure line Service Requested: Physical Therapy Frequency: 1 week 8 Any new concerns about the patient? No

## 2024-09-12 NOTE — Telephone Encounter (Signed)
 Called and spoke with Laura/[facility]  VO given Per Dr. Mercer for P.T.

## 2024-09-21 ENCOUNTER — Emergency Department (HOSPITAL_BASED_OUTPATIENT_CLINIC_OR_DEPARTMENT_OTHER)
Admission: EM | Admit: 2024-09-21 | Discharge: 2024-09-21 | Disposition: A | Discharge status: Home / Self Care | Attending: Emergency Medicine | Admitting: Emergency Medicine

## 2024-09-21 ENCOUNTER — Other Ambulatory Visit: Payer: Self-pay

## 2024-09-21 ENCOUNTER — Encounter (HOSPITAL_BASED_OUTPATIENT_CLINIC_OR_DEPARTMENT_OTHER): Payer: Self-pay

## 2024-09-21 ENCOUNTER — Ambulatory Visit: Admitting: Podiatry

## 2024-09-21 ENCOUNTER — Emergency Department (HOSPITAL_BASED_OUTPATIENT_CLINIC_OR_DEPARTMENT_OTHER)

## 2024-09-21 DIAGNOSIS — N189 Chronic kidney disease, unspecified: Secondary | ICD-10-CM | POA: Diagnosis not present

## 2024-09-21 DIAGNOSIS — R7989 Other specified abnormal findings of blood chemistry: Secondary | ICD-10-CM | POA: Diagnosis not present

## 2024-09-21 DIAGNOSIS — Z7982 Long term (current) use of aspirin: Secondary | ICD-10-CM | POA: Diagnosis not present

## 2024-09-21 DIAGNOSIS — F0390 Unspecified dementia without behavioral disturbance: Secondary | ICD-10-CM | POA: Insufficient documentation

## 2024-09-21 DIAGNOSIS — E1122 Type 2 diabetes mellitus with diabetic chronic kidney disease: Secondary | ICD-10-CM | POA: Diagnosis not present

## 2024-09-21 DIAGNOSIS — Z79890 Hormone replacement therapy (postmenopausal): Secondary | ICD-10-CM | POA: Diagnosis not present

## 2024-09-21 DIAGNOSIS — E039 Hypothyroidism, unspecified: Secondary | ICD-10-CM | POA: Diagnosis not present

## 2024-09-21 DIAGNOSIS — I509 Heart failure, unspecified: Secondary | ICD-10-CM | POA: Diagnosis not present

## 2024-09-21 DIAGNOSIS — I130 Hypertensive heart and chronic kidney disease with heart failure and stage 1 through stage 4 chronic kidney disease, or unspecified chronic kidney disease: Secondary | ICD-10-CM | POA: Diagnosis not present

## 2024-09-21 DIAGNOSIS — R4182 Altered mental status, unspecified: Secondary | ICD-10-CM | POA: Diagnosis present

## 2024-09-21 DIAGNOSIS — Z91040 Latex allergy status: Secondary | ICD-10-CM | POA: Insufficient documentation

## 2024-09-21 DIAGNOSIS — R4689 Other symptoms and signs involving appearance and behavior: Secondary | ICD-10-CM

## 2024-09-21 LAB — CBC WITH DIFFERENTIAL/PLATELET
Abs Immature Granulocytes: 0.02 K/uL (ref 0.00–0.07)
Basophils Absolute: 0 K/uL (ref 0.0–0.1)
Basophils Relative: 0 %
Eosinophils Absolute: 0.2 K/uL (ref 0.0–0.5)
Eosinophils Relative: 3 %
HCT: 34.8 % — ABNORMAL LOW (ref 36.0–46.0)
Hemoglobin: 11.5 g/dL — ABNORMAL LOW (ref 12.0–15.0)
Immature Granulocytes: 0 %
Lymphocytes Relative: 29 %
Lymphs Abs: 2.2 K/uL (ref 0.7–4.0)
MCH: 32.5 pg (ref 26.0–34.0)
MCHC: 33 g/dL (ref 30.0–36.0)
MCV: 98.3 fL (ref 80.0–100.0)
Monocytes Absolute: 0.7 K/uL (ref 0.1–1.0)
Monocytes Relative: 9 %
Neutro Abs: 4.5 K/uL (ref 1.7–7.7)
Neutrophils Relative %: 59 %
Platelets: 188 K/uL (ref 150–400)
RBC: 3.54 MIL/uL — ABNORMAL LOW (ref 3.87–5.11)
RDW: 14.2 % (ref 11.5–15.5)
WBC: 7.6 K/uL (ref 4.0–10.5)
nRBC: 0 % (ref 0.0–0.2)

## 2024-09-21 LAB — URINALYSIS, ROUTINE W REFLEX MICROSCOPIC
Bacteria, UA: NONE SEEN
Bilirubin Urine: NEGATIVE
Glucose, UA: NEGATIVE mg/dL
Ketones, ur: NEGATIVE mg/dL
Leukocytes,Ua: NEGATIVE
Nitrite: NEGATIVE
Protein, ur: NEGATIVE mg/dL
Specific Gravity, Urine: 1.009 (ref 1.005–1.030)
pH: 5 (ref 5.0–8.0)

## 2024-09-21 LAB — COMPREHENSIVE METABOLIC PANEL WITH GFR
ALT: 14 U/L (ref 0–44)
AST: 34 U/L (ref 15–41)
Albumin: 3.8 g/dL (ref 3.5–5.0)
Alkaline Phosphatase: 72 U/L (ref 38–126)
Anion gap: 9 (ref 5–15)
BUN: 27 mg/dL — ABNORMAL HIGH (ref 8–23)
CO2: 27 mmol/L (ref 22–32)
Calcium: 11 mg/dL — ABNORMAL HIGH (ref 8.9–10.3)
Chloride: 100 mmol/L (ref 98–111)
Creatinine, Ser: 1.3 mg/dL — ABNORMAL HIGH (ref 0.44–1.00)
GFR, Estimated: 39 mL/min — ABNORMAL LOW (ref >60)
Glucose, Bld: 215 mg/dL — ABNORMAL HIGH (ref 70–99)
Potassium: 4.1 mmol/L (ref 3.5–5.1)
Sodium: 136 mmol/L (ref 135–145)
Total Bilirubin: 0.4 mg/dL (ref 0.0–1.2)
Total Protein: 8 g/dL (ref 6.5–8.1)

## 2024-09-21 LAB — LACTIC ACID, PLASMA: Lactic Acid, Venous: 1.3 mmol/L (ref 0.5–1.9)

## 2024-09-21 LAB — CBG MONITORING, ED: Glucose-Capillary: 190 mg/dL — ABNORMAL HIGH (ref 70–99)

## 2024-09-21 MED ORDER — LACTATED RINGERS IV BOLUS
500.0000 mL | Freq: Once | INTRAVENOUS | Status: AC
  Administered 2024-09-21: 500 mL via INTRAVENOUS

## 2024-09-21 NOTE — ED Notes (Signed)
 Pt family member reports high blood glucose reading in the PM. Recent increase to 8 units in morning. Pt family reports minimal fluid intake throughout the day and less output. Pt is more confused x1 week than baseline.

## 2024-09-21 NOTE — Discharge Instructions (Signed)
 Bonnie Chavez  Thank you for allowing us  to take care of you today.  You came to the Emergency Department today because you had abnormal behavior last night and this morning.  Here in the emergency department you did not have any other symptoms, and you are acting more like your usual self.  Your urine did not show signs of infection, your blood counts were normal.  Your kidney number was a bit elevated, not to the level of a kidney injury, so we gave you fluids, recommend plenty of fluids over the next several days.  We did a CT that was normal as well.  It is unclear what exactly caused your symptoms, sometimes right before bed and before getting up can be a harder time from a memory perspective for older adults.  We recommend continued follow-up with your primary care doctor.   To-Do: 1. Please follow-up with your primary doctor within 1 - 2 weeks / as soon as possible.   Please return to the Emergency Department or call 911 if you experience have worsening of your symptoms, or do not get better, chest pain, shortness of breath, severe or significantly worsening pain, high fever, severe confusion, pass out or have any reason to think that you need emergency medical care.   We hope you feel better soon.   Mitzie Later, MD Department of Emergency Medicine [facility]

## 2024-09-21 NOTE — ED Provider Notes (Signed)
 [place] EMERGENCY DEPARTMENT AT [facility] Provider Note   CSN: [id number] Arrival date & time: 09/21/24  9053     History Chief Complaint  Patient presents with   Urinary Tract Infection    HPI: Bonnie Chavez is a 89 y.o. female with history pertinent for hypertension, CHF, aortic stenosis, first-degree AV block, T2DM, hypothyroidism, neuropathy, CKD, HLD, elevated BMI, dementia who presents complaining of altered mental status. Patient arrived via POV accompanied by son, Bonnie Chavez.  History provided by patient and relative: Son.  No interpreter required during this encounter.  Patient reports that Bonnie Chavez is not sure why Bonnie Chavez is in the emergency department, denies any fever, chills, chest pain, shortness breath, nausea, vomiting, diarrhea, other complaints.  Son reports that patient has been altered.  Reports that last night Bonnie Chavez was confused as to whether or not Bonnie Chavez had a doctor's appointment this morning, was calling people by the wrong name.  During the daytime Bonnie Chavez has a caregiver, and son reports that caregiver stated that patient was attempting to walk around the house nude this morning.  Patient reportedly has had similar symptoms previously when Bonnie Chavez had a UTI, therefore he wanted to have her evaluated in the emergency department.  Reports that Bonnie Chavez is now back to baseline, Bonnie Chavez has not had any recent fever, chills, complaints of dysuria, or other complaints.  Patient's recorded medical, surgical, social, medication list and allergies were reviewed in the Snapshot window as part of the initial history.   Prior to Admission medications   Medication Sig Start Date End Date Taking? Authorizing Provider  Continuous Glucose Receiver (FREESTYLE LIBRE 3 READER) DEVI 1 each by Other route. 09/19/24  Yes [provider]  Continuous Glucose Sensor (FREESTYLE LIBRE 3 PLUS SENSOR) MISC every 15 days; Duration: 30 days 09/19/24  Yes [provider]  acetaminophen   (TYLENOL ) 500 MG tablet Take 500 mg by mouth every 6 (six) hours.    [provider]  aspirin  81 MG EC tablet Take 81 mg by mouth at bedtime. Swallow whole.    [provider]  cinacalcet  (SENSIPAR ) 30 MG tablet Take 30 mg by mouth See admin instructions. Take 30 mg by mouth in the morning on Mon/Tues/Wed/Thurs/Fri only 11/01/19   [provider]  feeding supplement, GLUCERNA SHAKE, (GLUCERNA SHAKE) LIQD Take 237 mLs by mouth 3 (three) times daily between meals. Patient taking differently: Take 237 mLs by mouth daily. 06/22/24   Jillian Buttery, MD  furosemide  (LASIX ) 20 MG tablet Take 40 mg by mouth every morning. 05/19/18   Luke Chiquita SAUNDERS, DO  HUMULIN 70/30 KWIKPEN (70-30) 100 UNIT/ML KwikPen Inject 6-8 Units into the skin See admin instructions. Inject 6 units into the skin the morning and 8 units in the evening 02/24/21   [provider]  levothyroxine  (SYNTHROID ) 50 MCG tablet Take 50 mcg by mouth daily before breakfast.    [provider]  nebivolol  (BYSTOLIC ) 10 MG tablet Take 10 mg by mouth in the morning.    [provider]  nebivolol  (BYSTOLIC ) 5 MG tablet Take 1 tablet (5 mg total) by mouth daily. Patient taking differently: Take 5 mg by mouth at bedtime. 07/17/24   Mercer Clotilda SAUNDERS, MD  pantoprazole  (PROTONIX ) 40 MG tablet Take 1 tablet (40 mg total) by mouth daily. 06/23/24   Adhikari, Amrit, MD  polyethylene glycol (MIRALAX  / GLYCOLAX ) 17 g packet Take 17 g by mouth daily. 06/23/24   Adhikari, Amrit, MD  rosuvastatin  (CRESTOR ) 40 MG tablet Take  40 mg by mouth every evening.    [provider]  senna-docusate (SENOKOT-S) 8.6-50 MG tablet Take 1 tablet by mouth 2 (two) times daily. 06/22/24   Adhikari, Amrit, MD  Trolamine Salicylate (BLUE-EMU HEMP EX) Apply 1 application  topically daily as needed (pain- affected sites).    [provider]     Allergies: Lisinopril , Ibuprofen, Nsaids, Latex, and Penicillins   Review of  Systems   ROS as per HPI  Physical Exam Updated Vital Signs BP (!) 150/63 (BP Location: Right Arm)   Pulse 60   Temp 98.2 F (36.8 C) (Oral)   Resp 17   SpO2 99%  Physical Exam Vitals and nursing note reviewed.  Constitutional:      General: Bonnie Chavez is not in acute distress.    Appearance: Bonnie Chavez is well-developed.  HENT:     Head: Normocephalic and atraumatic.  Eyes:     Conjunctiva/sclera: Conjunctivae normal.  Cardiovascular:     Rate and Rhythm: Normal rate and regular rhythm.     Heart sounds: No murmur heard. Pulmonary:     Effort: Pulmonary effort is normal. No respiratory distress.     Breath sounds: Normal breath sounds.  Abdominal:     Palpations: Abdomen is soft.     Tenderness: There is no abdominal tenderness.  Musculoskeletal:        General: No swelling.     Cervical back: Neck supple.  Skin:    General: Skin is warm and dry.     Capillary Refill: Capillary refill takes less than 2 seconds.  Neurological:     Mental Status: Bonnie Chavez is alert and oriented to person, place, and time.     Comments: Oriented to person, place, time, not oriented to situation (not sure why Bonnie Chavez was brought to the emergency department)  Psychiatric:        Mood and Affect: Mood normal.     ED Course/ Medical Decision Making/ A&P    Procedures Procedures   Medications Ordered in ED Medications  lactated ringers  bolus 500 mL (0 mLs Intravenous Stopped 09/21/24 1359)    Medical Decision Making:   Bonnie Chavez is a 89 y.o. female who presents for altered mental status as per above.  Physical exam is pertinent for no focal abnormality.   The differential includes but is not limited to sepsis, UTI, metabolic encephalopathy, ACS, ICH, dementia, delirium.  Independent historian: Relative: Son  External data reviewed: Labs: reviewed prior labs for baseline  Initial Plan:   Screening labs including CBC and Metabolic panel to evaluate for infectious or metabolic etiology of  disease.  Urinalysis with reflex culture ordered to evaluate for UTI or relevant urologic/nephrologic pathology.  Screening lactic acid to evaluate for sepsis CT head to evaluate for structural intra-cranial pathology.  EKG to evaluate for cardiac pathology Objective evaluation as below reviewed   Labs: Ordered, Independent interpretation, and Details: Lactic acid WNL.  UA without UTI.  CBC without leukocytosis, improved anemia from baseline, baseline hemoglobin approximately 9-10, today 11.5.  No thrombocytopenia.  CMP with mild elevation of creatinine and BUN to 1.30 and 27.  Baseline creatinine is 0.9-1, with normal BUN.  No emergent electrolyte derangement or emergent LFT abnormality.  Radiology: Ordered, Independent interpretation, Details: Personally reviewed CT of the head, I do appreciate global atrophy, do not appreciate ICH, displaced fracture, MLS, loss of gray/white matter differentiation, and All images reviewed independently.  Agree with radiology report at this time.   CT Head Wo Contrast  Result Date: 09/21/2024 EXAM: CT HEAD WITHOUT CONTRAST 09/21/2024 02:28:07 PM TECHNIQUE: CT of the head was performed without the administration of intravenous contrast. Automated exposure control, iterative reconstruction, and/or weight based adjustment of the mA/kV was utilized to reduce the radiation dose to as low as reasonably achievable. COMPARISON: 08/11/2024 CLINICAL HISTORY: Mental status change, unknown cause. FINDINGS: BRAIN AND VENTRICLES: No acute hemorrhage. No evidence of acute infarct. Age-related cerebral atrophy. Periventricular white matter changes, likely sequela of chronic small vessel ischemic disease. Unchanged ventriculomegaly. No extra-axial collection. No mass effect or midline shift. ORBITS: No acute abnormality. SINUSES: No acute abnormality. SOFT TISSUES AND SKULL: No acute soft tissue abnormality. No skull fracture. IMPRESSION: 1. No acute intracranial abnormality.  Electronically signed by: Norman Gatlin MD 09/21/2024 02:35 PM EDT RP Workstation: [id number]    EKG/Medicine tests: Ordered and Independent interpretation EKG Interpretation: Rate 59, sinus rhythm, no ST elevations or depression, nonspecific T wave inversions and LVH, no significant change from most recent prior September 2025  Interventions: LR bolus  See the EMR for full details regarding lab and imaging results.  Patient overall well-appearing on exam, vitally stable, without focal abnormality on exam, oriented, now back to baseline.  Screening labs obtained.  Patient afebrile, no leukocytosis, lactic acid WNL, therefore doubt infection, sepsis.  UA obtained and has no evidence of UTI.  CT head obtained and does not reveal intracranial pathology.  EKG reassuring, no changes from most recent prior, in the absence of chest pain, do not feel that patient requires further workup from an ACS standpoint.  Patient's labs do demonstrate possible mild dehydration, patient has mild elevation of creatinine, does not rise to the level of AKI, as well as some likely hemoconcentration on her CBC consistent with mild dehydration.  Discussed with the son that dehydration is less likely the cause of patient's mental status change, given patient's symptoms primarily occurred early morning and late last night, possible that it was related to sundowning and her sleep/wake cycle, however given reassuring physical exam, labs, imaging, and that the patient is now back to baseline, Bonnie Chavez is safe to continue her workup outpatient.  Given patient's history of HFpEF, given judicious fluid bolus of 500 cc.  Patient continued to be at baseline on reevaluation, patient and son comfortable with plan for discharge with outpatient follow-up with PCP.  Presentation is most consistent with acute complicated illness and I did consider and rule out acute life/limb-threatening illness  Discussion of management or test interpretations  with external provider(s): Not indicated  Risk Drugs:Prescription drug management  Disposition: DISCHARGE: I believe that the patient is safe for discharge home with outpatient follow-up. Patient was informed of all pertinent physical exam, laboratory, and imaging findings. Patient's suspected etiology of their symptom presentation was discussed with the patient and all questions were answered. We discussed following up with PCP. I provided thorough ED return precautions. The patient feels safe and comfortable with this plan.  MDM generated using voice dictation software and may contain dictation errors.  Please contact me for any clarification or with any questions.  Clinical Impression:  1. Abnormal behavior      Discharge   Final Clinical Impression(s) / ED Diagnoses Final diagnoses:  Abnormal behavior    Rx / DC Orders ED Discharge Orders     None        Rogelia Jerilynn RAMAN, MD 09/23/24 [phone]

## 2024-09-21 NOTE — ED Triage Notes (Signed)
 Patient reports urinary symptoms and confusion starting last night as well as her blood glucose increased. Family reports this is usually signs of a urinary tract infection.

## 2024-09-21 NOTE — ED Notes (Signed)
 Reviewed AVS/discharge instructions with patient and son. Time allotted for and all questions answered. Patient and son are agreeable for d/c and escorted to ED exit by staff.

## 2024-09-27 NOTE — Progress Notes (Unsigned)
 [facility]   MULTIDISCIPLINARY HEART VALVE CLINIC                                     Cardiology Office Note:    Date:  09/29/2024   ID:  Bonnie Chavez, DOB [birth year], MRN [id number]  PCP:  Bonnie Clotilda SAUNDERS, MD  [facility] Cardiologist:  Bonnie Sor, MD (Inactive)  [facility] Structural heart: Bonnie Fell, MD [facility] Electrophysiologist:  None   Referring MD: Bonnie Clotilda SAUNDERS, MD   1 year s/p TAVR  History of Present Illness:    Bonnie Chavez is a 89 y.o. female with a hx of HTN, HLD, CKD stage IIIb, 1st degree AV block, carpal tunnel, T2DM, OA and severe aortic stenosis s/p TAVR (10/05/23) who presents to clinic for follow up.   She reported progressive exertional fatigue and shortness of breath. Echo 03/24/23 showed EF 60% and severe AS with a mean grad 32 mmHg, AVA 0.72 cm2, LHC 08/16/23 showed patent coronary arteries, right dominant, with moderate coronary calcification and mild plaquing but no significant obstructive disease. S/p TAVR with a 23 mm Edwards Sapien 3 Ultra Resilia THV via the TF approach on 10/05/23. Post operative echo showed EF 65%, mild LVH, normally functioning TAVR with a mean gradient of 11.2 mmHg and no PVL as well as mild MR. Pt has a long standing 1st degree AV block. There was a questionable ~5.4 second pause on telemetry which may have been related to loss of lead contact. She was discharged with a Zio AT which was reassuring.  Admitted in 05/2024 with UTI/sepsis/AKI and then readmitted 07/2024 for weakness and fall / intermittent confusion. Discharged home with home health. Seen in ER on 09/21/24 for altered mental status and found to be dehydrated.    Today the patient presents to clinic for follow up. Here with her son Bonnie Chavez. No CP. She has shortness of breath with activity. Occasional fatigue. No LE edema, orthopnea or PND. No dizziness or syncope. No blood in stool or urine. No palpitations.   Past Medical  History:  Diagnosis Date   (HFpEF) heart failure with preserved ejection fraction (HCC)    Anemia    History of , resolved   Carpal tunnel syndrome    Chronic low back pain 02/27/2016   -DDD -seeing [place] [facility]    CKD stage 3b, GFR 30-44 ml/min (HCC)    Diabetes mellitus    type 2, sees Dr. Tommas   Goiter    Dr. Tommas   History of blood transfusion    auto transfusion post knee replacement   Hyperlipidemia    Hyperparathyroidism 02/27/2016   -sees endocrinologist, Dr. Tommas, has seen surgeon as well    Hypertension    Migraine    no longer has migraines (80's) after menopause   Mitral valve prolapse    Neuropathy    Osteoarthritis    DDD, sees [place] [facility]   PMB (postmenopausal bleeding) 02/28/2009   Resolved   S/P TAVR (transcatheter aortic valve replacement) 10/05/2023   s/p TAVR with a 23 mm Edwards S3UR via the TF approach by Dr. Fell and Lucas   Severe aortic stenosis    Vaginal atrophy    Vitamin D  deficiency      Current Medications: Current Meds  Medication Sig   acetaminophen  (TYLENOL ) 500 MG tablet Take 500 mg by mouth every 6 (six) hours.  amLODipine  (NORVASC ) 5 MG tablet Take 1 tablet (5 mg total) by mouth daily.   aspirin  81 MG EC tablet Take 81 mg by mouth at bedtime. Swallow whole.   cinacalcet  (SENSIPAR ) 30 MG tablet Take 30 mg by mouth See admin instructions. Take 30 mg by mouth in the morning on Mon/Tues/Wed/Thurs/Fri only   Continuous Glucose Receiver (FREESTYLE LIBRE 3 READER) DEVI 1 each by Other route.   Continuous Glucose Sensor (FREESTYLE LIBRE 3 PLUS SENSOR) MISC every 15 days; Duration: 30 days   feeding supplement, GLUCERNA SHAKE, (GLUCERNA SHAKE) LIQD Take 237 mLs by mouth 3 (three) times daily between meals.   furosemide  (LASIX ) 20 MG tablet Take 40 mg by mouth every morning.   HUMULIN 70/30 KWIKPEN (70-30) 100 UNIT/ML KwikPen Inject 6-8 Units into the skin See admin instructions. Inject 6 units into the skin the morning and 8  units in the evening   levothyroxine  (SYNTHROID ) 50 MCG tablet Take 50 mcg by mouth daily before breakfast.   nebivolol  (BYSTOLIC ) 10 MG tablet Take 10 mg by mouth in the morning.   nebivolol  (BYSTOLIC ) 5 MG tablet Take 1 tablet (5 mg total) by mouth daily. (Patient taking differently: Take 5 mg by mouth at bedtime.)   pantoprazole  (PROTONIX ) 40 MG tablet Take 1 tablet (40 mg total) by mouth daily.   polyethylene glycol (MIRALAX  / GLYCOLAX ) 17 g packet Take 17 g by mouth daily.   rosuvastatin  (CRESTOR ) 40 MG tablet Take 40 mg by mouth every evening.   senna-docusate (SENOKOT-S) 8.6-50 MG tablet Take 1 tablet by mouth 2 (two) times daily.   Trolamine Salicylate (BLUE-EMU HEMP EX) Apply 1 application  topically daily as needed (pain- affected sites).      ROS:   Please see the history of present illness.    All other systems reviewed and are negative.  EKGs       Risk Assessment/Calculations:           Physical Exam:    VS:  BP (!) 142/64   Pulse (!) 57   Ht 5' 3 (1.6 m)   Wt 161 lb 3.2 oz (73.1 kg)   SpO2 96%   BMI 28.56 kg/m     Wt Readings from Last 3 Encounters:  09/28/24 161 lb 3.2 oz (73.1 kg)  08/18/24 168 lb 3.2 oz (76.3 kg)  08/11/24 172 lb 13.5 oz (78.4 kg)     GEN: Well nourished, well developed in no acute distress NECK: No JVD CARDIAC: RRR, 2/6 SEM @ RUSB. No rubs, gallops RESPIRATORY:  Clear to auscultation without rales, wheezing or rhonchi  ABDOMEN: Soft, non-tender, non-distended EXTREMITIES:  No edema; No deformity.    ASSESSMENT:    1. S/P TAVR (transcatheter aortic valve replacement)   2. Primary hypertension   3. CKD stage 3b, GFR 30-44 ml/min (HCC)   4. Hyperlipidemia with target low density lipoprotein (LDL) cholesterol less than 55 mg/dL     PLAN:    In order of problems listed above:  Severe AS s/p TAVR:  -- Echo today shows EF 55%, severe cLVH, moderate RV thickness, normally functioning TAVR with a mean gradient of 14 mm hg and  no PVL.  -- NYHA class II symptoms.  -- Continue Asprin 81mg  daily -- SBE prophylaxis discussed; she has azithromycin  due to a PCN allergy. -- Continue regular follow up with Dr. Wonda (Dr. Burnard retired).   HTN:  -- BP elevated today and by home readings. Severe LVH on echo -- Continue Bystolic   10mg  in AM / 5 mg in PM. -- Start amlodipine  5 mg daily.  -- Continue Lasix  40mg  daily.    CKD stage IIIb:  -- Creat baseline 1.3-1.64.  -- Creat 1.3 on labs 09/21/24 (personally reviewed).  HLD: -- Continue Crestor  40mg  daily.    Medication Adjustments/Labs and Tests Ordered: Current medicines are reviewed at length with the patient today.  Concerns regarding medicines are outlined above.  No orders of the defined types were placed in this encounter.  Meds ordered this encounter  Medications   amLODipine  (NORVASC ) 5 MG tablet    Sig: Take 1 tablet (5 mg total) by mouth daily.    Dispense:  90 tablet    Refill:  3    Supervising Provider:   WONDA SHARPER [[id number]]    Patient Instructions  Medication Instructions:  Your physician has recommended you make the following change in your medication: START taking amlodipine  5mg  daily. Prescription sent to pharmacy.   *If you need a refill on your cardiac medications before your next appointment, please call your pharmacy*  Lab Work: None needed If you have labs (blood work) drawn today and your tests are completely normal, you will receive your results only by: MyChart Message (if you have MyChart) OR A paper copy in the mail If you have any lab test that is abnormal or we need to change your treatment, we will call you to review the results.  Testing/Procedures: None needed  Follow-Up: At [facility], you and your health needs are our priority.  As part of our continuing mission to provide you with exceptional heart care, our providers are all part of one team.  This team includes your primary Cardiologist (physician) and  Advanced Practice Providers or APPs (Physician Assistants and Nurse Practitioners) who all work together to provide you with the care you need, when you need it.  Your next appointment:    As scheduled on 01/29/25  Provider:   Sharper Wonda, MD    We recommend signing up for the patient portal called MyChart.  Sign up information is provided on this After Visit Summary.  MyChart is used to connect with patients for Virtual Visits (Telemedicine).  Patients are able to view lab/test results, encounter notes, upcoming appointments, etc.  Non-urgent messages can be sent to your provider as well.   To learn more about what you can do with MyChart, go to [email or web address].            Signed, Bonnie Hummer, PA-C  09/29/2024 2:44 PM    [facility]

## 2024-09-28 ENCOUNTER — Ambulatory Visit: Attending: Physician Assistant | Admitting: Physician Assistant

## 2024-09-28 ENCOUNTER — Ambulatory Visit (HOSPITAL_COMMUNITY)
Admission: RE | Admit: 2024-09-28 | Discharge: 2024-09-28 | Disposition: A | Discharge status: Home / Self Care | Source: Ambulatory Visit | Attending: Cardiovascular Disease | Admitting: Cardiovascular Disease

## 2024-09-28 VITALS — BP 142/64 | HR 57 | Ht 63.0 in | Wt 161.2 lb

## 2024-09-28 DIAGNOSIS — E785 Hyperlipidemia, unspecified: Secondary | ICD-10-CM

## 2024-09-28 DIAGNOSIS — I10 Essential (primary) hypertension: Secondary | ICD-10-CM

## 2024-09-28 DIAGNOSIS — Z952 Presence of prosthetic heart valve: Secondary | ICD-10-CM | POA: Diagnosis present

## 2024-09-28 DIAGNOSIS — N1832 Chronic kidney disease, stage 3b: Secondary | ICD-10-CM | POA: Diagnosis not present

## 2024-09-28 LAB — ECHOCARDIOGRAM COMPLETE
AR max vel: 1 cm2
AV Area VTI: 1.03 cm2
AV Area mean vel: 1.09 cm2
AV Mean grad: 14 mmHg
AV Peak grad: 26.8 mmHg
Ao pk vel: 2.59 m/s
Area-P 1/2: 2.33 cm2
S' Lateral: 2.3 cm

## 2024-09-28 MED ORDER — AMLODIPINE BESYLATE 5 MG PO TABS: 5.0000 mg | ORAL_TABLET | Freq: Every day | ORAL | 3 refills | Status: AC

## 2024-09-28 NOTE — Patient Instructions (Signed)
 Medication Instructions:  Your physician has recommended you make the following change in your medication: START taking amlodipine  5mg  daily. Prescription sent to pharmacy.   *If you need a refill on your cardiac medications before your next appointment, please call your pharmacy*  Lab Work: None needed If you have labs (blood work) drawn today and your tests are completely normal, you will receive your results only by: MyChart Message (if you have MyChart) OR A paper copy in the mail If you have any lab test that is abnormal or we need to change your treatment, we will call you to review the results.  Testing/Procedures: None needed  Follow-Up: At [facility], you and your health needs are our priority.  As part of our continuing mission to provide you with exceptional heart care, our providers are all part of one team.  This team includes your primary Cardiologist (physician) and Advanced Practice Providers or APPs (Physician Assistants and Nurse Practitioners) who all work together to provide you with the care you need, when you need it.  Your next appointment:    As scheduled on 01/29/25  Provider:   Ozell Fell, MD    We recommend signing up for the patient portal called MyChart.  Sign up information is provided on this After Visit Summary.  MyChart is used to connect with patients for Virtual Visits (Telemedicine).  Patients are able to view lab/test results, encounter notes, upcoming appointments, etc.  Non-urgent messages can be sent to your provider as well.   To learn more about what you can do with MyChart, go to [email or web address].

## 2024-09-29 ENCOUNTER — Ambulatory Visit: Payer: Medicare HMO

## 2024-09-29 ENCOUNTER — Other Ambulatory Visit (HOSPITAL_COMMUNITY): Payer: Medicare HMO

## 2024-09-29 ENCOUNTER — Ambulatory Visit: Payer: Self-pay | Admitting: Physician Assistant

## 2024-10-09 ENCOUNTER — Telehealth: Payer: Self-pay | Admitting: *Deleted

## 2024-10-09 NOTE — Telephone Encounter (Signed)
 Copied from CRM #[id number]. Topic: General - Other >> Oct 09, 2024  4:25 PM Alfonso ORN wrote: Reason for CRM: [facility] called report missing visit for OT . refused OT

## 2024-10-17 ENCOUNTER — Ambulatory Visit: Admitting: Podiatry

## 2024-10-17 ENCOUNTER — Encounter: Payer: Self-pay | Admitting: Podiatry

## 2024-10-17 DIAGNOSIS — E1149 Type 2 diabetes mellitus with other diabetic neurological complication: Secondary | ICD-10-CM | POA: Diagnosis not present

## 2024-10-17 DIAGNOSIS — M79675 Pain in left toe(s): Secondary | ICD-10-CM | POA: Diagnosis not present

## 2024-10-17 DIAGNOSIS — B351 Tinea unguium: Secondary | ICD-10-CM | POA: Diagnosis not present

## 2024-10-17 DIAGNOSIS — M79674 Pain in right toe(s): Secondary | ICD-10-CM

## 2024-10-17 NOTE — Progress Notes (Unsigned)
 Subjective: Chief Complaint  Patient presents with   Diabetes    DFC IDDM A1C 78. Toenail trim.     89 year old female presents for above concerns.  She presents today for thick, elongated nails that she is unable to trim herself.  States that she was getting calluses between her toes which have been doing much better.  Bonnie Clotilda SAUNDERS, MD  Lab Results  Component Value Date   HGBA1C 7.8 (H) 06/18/2024   HGBA1C 7.6 01/05/2023   HGBA1C 7.3 (A) 12/15/2021     Objective: AAO x3, NAD DP/PT pulses palpable bilaterally, CRT less than 3 seconds Nails continue be hypertrophic, dystrophic with yellow, brown discoloration.  There is no edema no erythema.  Tenderness nails 1-5 bilaterally. Hammertoes present.  Minimal callus formation noted along the right medial second toe without any underlying ulceration, drainage or signs of infection. Bunion, hammertoes present.  Flatfoot present. No pain with calf compression, swelling, warmth, erythema  Assessment: Symptomatic onychomycosis  Plan: -All treatment options discussed with the patient including all alternatives, risks, complications.  -Sharply debrided the nails x 10 without any complications or bleeding.  -As a courtesy debride the hyperkeratotic lesions courtesy as it was quite minimal again today normal resolved.  Continue offloading.  He has been using collagen which has been helping.    Bonnie Chavez DPM

## 2024-10-23 ENCOUNTER — Telehealth: Payer: Self-pay | Admitting: *Deleted

## 2024-10-23 ENCOUNTER — Other Ambulatory Visit: Payer: Self-pay | Admitting: Physician Assistant

## 2024-10-23 NOTE — Telephone Encounter (Signed)
 Copied from CRM #[id number]. Topic: Clinical - Home Health Verbal Orders >> Oct 23, 2024  4:25 PM Joesph NOVAK wrote: Caller/Agency:[facility] Callback Number: [phone] Service Requested: Occupational Therapy Frequency: 1 time a week for 3 weeks Any new concerns about the patient? No

## 2024-10-24 ENCOUNTER — Other Ambulatory Visit: Payer: Self-pay | Admitting: Physician Assistant

## 2024-10-24 ENCOUNTER — Telehealth: Payer: Self-pay | Admitting: Podiatry

## 2024-10-24 ENCOUNTER — Other Ambulatory Visit: Payer: Self-pay | Admitting: Podiatry

## 2024-10-24 MED ORDER — CICLOPIROX 8 % EX SOLN: Freq: Every day | CUTANEOUS | 2 refills | Status: AC

## 2024-10-24 NOTE — Telephone Encounter (Signed)
 Patient stated pharmacy did not receive order, for medication regarding fungus on feet.

## 2024-10-25 NOTE — Telephone Encounter (Signed)
 Left a VM giving VO per Dr. Mercer for O.T.

## 2024-10-27 MED ORDER — NEBIVOLOL HCL 10 MG PO TABS: 10.0000 mg | ORAL_TABLET | Freq: Every morning | ORAL | 3 refills | Status: AC

## 2024-11-03 ENCOUNTER — Telehealth: Payer: Self-pay

## 2024-11-03 NOTE — Telephone Encounter (Signed)
 Copied from CRM #[id number]. Topic: General - Other >> Nov 03, 2024  3:53 PM Mercedes MATSU wrote: Reason for CRM: Pt appt was missed due to pt having dr appt, visit will be moved to next week. Leita from Physical Therapy called [phone].

## 2024-11-07 ENCOUNTER — Telehealth: Payer: Self-pay

## 2024-11-07 NOTE — Telephone Encounter (Unsigned)
 Copied from CRM #[id number]. Topic: Clinical - Home Health Verbal Orders >> Nov 07, 2024  4:16 PM Nessti S wrote: Caller/Agency: [facility] Number: [phone] Service Requested: Occupational Therapy Frequency: 1x wk for 3 wks Any new concerns about the patient? No

## 2024-11-08 ENCOUNTER — Telehealth: Payer: Self-pay

## 2024-11-08 NOTE — Telephone Encounter (Signed)
 Called and left a Vm with zacharia  VO for O.T. Per Dr. Mercer

## 2024-11-08 NOTE — Telephone Encounter (Signed)
 Called and spoke with Bonnie Chavez  [facility] VO given per Dr. Mercer

## 2024-11-08 NOTE — Telephone Encounter (Signed)
 Copied from CRM #[id number]. Topic: Clinical - Home Health Verbal Orders >> Nov 08, 2024 12:04 PM China J wrote: Caller/Agency: Leita Gander  [facility] Callback Number: [phone] Voicemail line is secure Service Requested: Physical Therapy Frequency: 2 week 2; 1 week 6 Any new concerns about the patient? No Patient had a fall earlier this week but is doing okay.

## 2024-11-29 ENCOUNTER — Ambulatory Visit: Admitting: Family Medicine

## 2024-11-29 ENCOUNTER — Encounter: Payer: Self-pay | Admitting: Family Medicine

## 2024-11-29 VITALS — BP 148/62 | HR 61 | Temp 98.4°F | Ht 63.0 in | Wt 163.2 lb

## 2024-11-29 DIAGNOSIS — G4700 Insomnia, unspecified: Secondary | ICD-10-CM

## 2024-11-29 DIAGNOSIS — E1122 Type 2 diabetes mellitus with diabetic chronic kidney disease: Secondary | ICD-10-CM

## 2024-11-29 DIAGNOSIS — N1832 Chronic kidney disease, stage 3b: Secondary | ICD-10-CM

## 2024-11-29 DIAGNOSIS — Z794 Long term (current) use of insulin: Secondary | ICD-10-CM

## 2024-11-29 DIAGNOSIS — E782 Mixed hyperlipidemia: Secondary | ICD-10-CM

## 2024-11-29 DIAGNOSIS — I10 Essential (primary) hypertension: Secondary | ICD-10-CM

## 2024-11-29 DIAGNOSIS — H903 Sensorineural hearing loss, bilateral: Secondary | ICD-10-CM

## 2024-11-29 DIAGNOSIS — E213 Hyperparathyroidism, unspecified: Secondary | ICD-10-CM | POA: Diagnosis not present

## 2024-11-29 DIAGNOSIS — I5032 Chronic diastolic (congestive) heart failure: Secondary | ICD-10-CM

## 2024-11-29 NOTE — Progress Notes (Unsigned)
 "  Established Patient Office Visit   Subjective  Patient ID: Bonnie Chavez, female    DOB: [birth year]  Age: 89 y.o. MRN: [id number]  Chief Complaint  Patient presents with   Medical Management of Chronic Issues    Patient came in today for 3 month follow-up, Diabetes, CHF, CKD   Patient accompanied by her son.  Patient is an 89 year old female seen for follow-up on chronic conditions.  Endorses some continued weakness and balance issues, which she attributes to tendinitis in her left hip and knee, possibly originating from her lower back.  In [facility] PT twice a week. OT recently ended.  Gets tired easily.  BP fluctuating, with systolic readings in the 150s, particularly in the mornings before taking her medication. She sometimes rechecks her blood pressure after taking her medication and readings are lower.   Wakes up multiple times at night due to nocturia.  Trying to to increase water intake after previous episode of dehydration/UTI led to an ED visit.  History of DM 2.  Has BS logs.  Endorses recent episode of elevated BS, 155 before dinner, despite having chicken salad and crackers for lunch.   Currently using hearing aids and has difficulty cleaning them due to issues with her fingers.     Patient Active Problem List   Diagnosis Date Noted   Weakness of both legs 08/11/2024   Severe sepsis (HCC) 06/17/2024   Acute cystitis 06/17/2024   Acquired hypothyroidism 06/17/2024   Generalized weakness 06/16/2024   1st degree AV block 10/06/2023   S/P TAVR (transcatheter aortic valve replacement) 10/05/2023   Severe aortic stenosis    Carpal tunnel syndrome    Neuropathy    (HFpEF) heart failure with preserved ejection fraction (HCC)    CKD stage 3b, GFR 30-44 ml/min (HCC)    Dysfunction of right eustachian tube 09/17/2020   Excessive cerumen in ear canal, bilateral 02/01/2018   Sensorineural hearing loss (SNHL), bilateral 01/19/2017   Hyperparathyroidism 02/27/2016   Chronic  low back pain 02/27/2016   Morbid obesity (HCC) 02/27/2016   Chronic diastolic CHF (congestive heart failure) (HCC) 10/28/2015   Chronic kidney disease 05/24/2014   Essential hypertension 08/21/2013   HLD (hyperlipidemia) 08/21/2013   PMB (postmenopausal bleeding) 05/15/2013   Multiple thyroid  nodules 03/09/2013   Vitamin D  deficiency 03/09/2013   Osteoarthritis 02/06/2010   Vaginal atrophy 01/12/2002    Class: History of   DM2 (diabetes mellitus, type 2) (HCC) 09/07/2001    Class: History of   Past Medical History:  Diagnosis Date   (HFpEF) heart failure with preserved ejection fraction (HCC)    Anemia    History of , resolved   Carpal tunnel syndrome    Chronic low back pain 02/27/2016   -DDD -seeing [place] [facility]    CKD stage 3b, GFR 30-44 ml/min (HCC)    Diabetes mellitus    type 2, sees Dr. Tommas   Goiter    Dr. Tommas   History of blood transfusion    auto transfusion post knee replacement   Hyperlipidemia    Hyperparathyroidism 02/27/2016   -sees endocrinologist, Dr. Tommas, has seen surgeon as well    Hypertension    Migraine    no longer has migraines (80's) after menopause   Mitral valve prolapse    Neuropathy    Osteoarthritis    DDD, sees [place] [facility]   PMB (postmenopausal bleeding) 02/28/2009   Resolved   S/P TAVR (transcatheter aortic valve replacement) 10/05/2023   s/p TAVR with  a 23 mm Edwards S3UR via the TF approach by Dr. Wonda and Lucas   Severe aortic stenosis    Vaginal atrophy    Vitamin D  deficiency    Past Surgical History:  Procedure Laterality Date   CARPAL TUNNEL RELEASE Right 04/30/2023   Procedure: RIGHT CARPAL TUNNEL RELEASE;  Surgeon: Murrell Drivers, MD;  Location: [facility] OR;  Service: Orthopedics;  Laterality: Right;  30 MIN   CESAREAN SECTION  1972 &1976   CHOLECYSTECTOMY  1991   COLONOSCOPY     CORONARY ANGIOGRAPHY N/A 08/16/2023   Procedure: CORONARY ANGIOGRAPHY;  Surgeon: Wonda Sharper, MD;  Location: [facility] INVASIVE CV LAB;   Service: Cardiovascular;  Laterality: N/A;   DILATATION & CURRETTAGE/HYSTEROSCOPY WITH RESECTOCOPE N/A 05/15/2013   Procedure: DILATATION & CURETTAGE/HYSTEROSCOPY WITH RESECTOCOPE;  Surgeon: Shanda SHAUNNA Muscat, MD;  Location: [facility] ORS;  Service: Gynecology;  Laterality: N/A;   DILATATION & CURRETTAGE/HYSTEROSCOPY WITH RESECTOCOPE N/A 06/03/2017   Procedure: DILATATION & CURETTAGE/HYSTEROSCOPY;  Surgeon: Muscat Shanda SHAUNNA, MD;  Location: [facility] ORS;  Service: Gynecology;  Laterality: N/A;   DILATION AND CURETTAGE OF UTERUS  1975   HEMORRHOID SURGERY  1997   KNEE SURGERY  2000 2011   right knee replacement    REPLACEMENT TOTAL KNEE  2000   left   SHOULDER SURGERY  2004 2010    TRANSTHORACIC ECHOCARDIOGRAM  07/2010   EF=>55%; LA mildly dilated; trace MR/TR;   TUBAL LIGATION  1976   Social History[1] Family History  Problem Relation Age of Onset   Hypertension Mother    Diabetes Mother    Hyperlipidemia Mother    Heart attack Mother    Heart disease Father    Heart attack Father    Stroke Brother    Heart disease Brother    Diabetes Brother    Uterine cancer Maternal Grandmother    Allergies[2]  ROS Negative unless stated above    Objective:     BP (!) 148/62 (BP Location: Right Arm, Patient Position: Sitting)   Pulse 61   Temp 98.4 F (36.9 C) (Oral)   Ht 5' 3 (1.6 m)   Wt 163 lb 3.2 oz (74 kg)   SpO2 98%   BMI 28.91 kg/m  BP Readings from Last 3 Encounters:  11/29/24 (!) 148/62  09/28/24 (!) 142/64  09/21/24 (!) 150/63   Wt Readings from Last 3 Encounters:  11/29/24 163 lb 3.2 oz (74 kg)  09/28/24 161 lb 3.2 oz (73.1 kg)  08/18/24 168 lb 3.2 oz (76.3 kg)      Physical Exam Constitutional:      General: She is not in acute distress.    Appearance: Normal appearance.  HENT:     Head: Normocephalic and atraumatic.     Nose: Nose normal.     Mouth/Throat:     Mouth: Mucous membranes are moist.  Cardiovascular:     Rate and Rhythm: Normal rate and regular  rhythm.     Heart sounds: Murmur heard.     Systolic murmur is present with a grade of 3/6.     No gallop.  Pulmonary:     Effort: Pulmonary effort is normal. No respiratory distress.     Breath sounds: Normal breath sounds. No wheezing, rhonchi or rales.  Skin:    General: Skin is warm and dry.  Neurological:     Mental Status: She is alert and oriented to person, place, and time.        11/29/2024   10:11 AM 08/18/2024  1:13 PM 04/03/2024    1:48 PM  Depression screen PHQ 2/9  Decreased Interest 0 0 0  Down, Depressed, Hopeless 0 0 0  PHQ - 2 Score 0 0 0  Altered sleeping 1 3 0  Tired, decreased energy 3 3 0  Change in appetite 0 1 0  Feeling bad or failure about yourself  0 0 0  Trouble concentrating 1 1 0  Moving slowly or fidgety/restless 0  0  Suicidal thoughts 0 0 0  PHQ-9 Score 5 8  0   Difficult doing work/chores Somewhat difficult       Data saved with a previous flowsheet row definition      11/29/2024   10:11 AM 04/03/2024    1:48 PM 02/21/2024    8:37 AM 01/20/2024   10:33 AM  GAD 7 : Generalized Anxiety Score  Nervous, Anxious, on Edge 0 0 0 0  Control/stop worrying 0 0 0 0  Worry too much - different things 0 0 0 0  Trouble relaxing 0 0 0 0  Restless 0 0 0 0  Easily annoyed or irritable 0 0 0 0  Afraid - awful might happen 0 0 0 0  Total GAD 7 Score 0 0 0 0  Anxiety Difficulty Not difficult at all   Not difficult at all     No results found for any visits on 11/29/24.    Assessment & Plan:   Type 2 diabetes mellitus with stage 3b chronic kidney disease, with long-term current use of insulin  (HCC)  Stage 3b chronic kidney disease (HCC)  Chronic diastolic CHF (congestive heart failure) (HCC)  Hyperparathyroidism  Mixed hyperlipidemia  Essential hypertension  Sensorineural hearing loss (SNHL), bilateral  Insomnia, unspecified type  Left hip and knee tendinitis with balance impairment and weakness.  Continue HH physical therapy.   Supportive care such as topical analgesics like Biofreeze, Icy Hot, or Aspercreme, heat and massage okay.  Also consider wearing supportive shoes.  Use of rollator encouraged as well as asking for assistance with certain tasks around the house.  Type 2 diabetes mellitus with stage 3b chronic kidney disease.  DM controlled. A1c was 7.6% 09/12/2024.  Discussed diet changes.  Continue current medications including Humulin 70/30 6 units in a.m. and 8 units in p.m.  Monitor blood glucose levels regularly.  Continue follow-up with nephrology, endocrinology  Euvolemic on exam.  History of TAVR, HTN, chronic diastolic CHF.  Continue Nebivolol  10 mg daily, Lasix  40 mg daily, Norvasc  5 mg daily for HTN and CHF.  Ensure adequate hydration to fluid restriction if applicable, especially with Lasix  use, and adjust meal timing to improve sleep quality and reduce nocturia.  Continue follow-up with cardiology.  Continue Crestor  40 mg nightly for mixed hyperlipidemia.  Consider bone density scan.  Can notify clinic when ready to schedule.  Attempted to contact hearing aid office during clinic visit but unsuccessful.  Will have patient reach out to them in regards to having hearing aids cleaned.  Return in about 4 months (around 03/29/2025).   Bonnie JONELLE Single, MD      [1]  Social History Tobacco Use   Smoking status: Never   Smokeless tobacco: Never  Vaping Use   Vaping status: Never Used  Substance Use Topics   Alcohol use: No    Alcohol/week: 0.0 standard drinks of alcohol   Drug use: No  [2]  Allergies Allergen Reactions   Lisinopril  Hives, Other (See Comments) and Cough    And lip  edema    Ibuprofen Other (See Comments)    Reaction not recalled by the patient    Nsaids Other (See Comments)    Abnormal renal function   Latex Rash   Penicillins Rash and Other (See Comments)    Pt has taken Keflex without difficulty Has patient had a PCN reaction causing immediate rash, facial/tongue/throat  swelling, SOB or lightheadedness with hypotension: yes     "

## 2024-12-04 ENCOUNTER — Telehealth: Payer: Self-pay | Admitting: Family Medicine

## 2024-12-04 NOTE — Telephone Encounter (Signed)
 "  Order has been re-faxed   "

## 2024-12-04 NOTE — Telephone Encounter (Signed)
 Copied from CRM #[id number]. Topic: General - Other >> Dec 04, 2024  2:24 PM Winona SAUNDERS wrote: Zacharia OT with [facility] Pt missed her OT therapy last week so she will be seen this week. Zacharia  would also like to know if the pt equipment has been sent to a supplier, two raised bars and raised commode so she can follow up. Best call back [phone]

## 2024-12-07 ENCOUNTER — Ambulatory Visit (INDEPENDENT_AMBULATORY_CARE_PROVIDER_SITE_OTHER): Admitting: Family Medicine

## 2024-12-07 DIAGNOSIS — Z0000 Encounter for general adult medical examination without abnormal findings: Secondary | ICD-10-CM

## 2024-12-07 NOTE — Patient Instructions (Signed)
 I really enjoyed getting to talk with you today! I am available on Tuesdays and Thursdays for virtual visits if you have any questions or concerns, or if I can be of any further assistance.   CHECKLIST FROM ANNUAL WELLNESS VISIT:  -Follow up (please call to schedule if not scheduled after visit):   -yearly for annual wellness visit with primary care office  Here is a list of your preventive care/health maintenance measures and the plan for each if any are due:  PLAN For any measures below that may be due:    1. Can get vaccines at the pharmacy if you wish. If you do, please let us  know so that we can update your record.   Health Maintenance  Topic Date Due   COVID-19 Vaccine (9 - 2025-26 season) 12/15/2024 (Originally 07/24/2024)   DTaP/Tdap/Td (1 - Tdap) 01/19/2025 (Originally 03/25/1953)   HEMOGLOBIN A1C  12/19/2024   OPHTHALMOLOGY EXAM  02/07/2025   FOOT EXAM  07/17/2025   Medicare Annual Wellness (AWV)  12/07/2025   Pneumococcal Vaccine: 50+ Years  Completed   Influenza Vaccine  Completed   Bone Density Scan  Completed   Zoster Vaccines- Shingrix  Completed   Meningococcal B Vaccine  Aged Out    -See a dentist at least yearly  -Get your eyes checked and then per your eye specialist's recommendations  -Other issues addressed today:   -I have included below further information regarding a healthy whole foods based diet, physical activity guidelines for adults, stress management and opportunities for social connections. I hope you find this information useful.   -----------------------------------------------------------------------------------------------------------------------------------------------------------------------------------------------------------------------------------------------------------    NUTRITION: -eat real food: lots of colorful vegetables (half the plate) and fruits -5-7 servings of vegetables and fruits per day (fresh or steamed is best), exp. 2  servings of vegetables with lunch and dinner and 2 servings of fruit per day. Berries and greens such as kale and collards are great choices.  -consume on a regular basis:  fresh fruits, fresh veggies, fish, nuts, seeds, healthy oils (such as olive oil, avocado oil), whole grains (make sure for bread/pasta/crackers/etc., that the first ingredient on label contains the word whole), legumes. -can eat small amounts of dairy and lean meat (no larger than the palm of your hand), but avoid processed meats such as ham, bacon, lunch meat, etc. -drink water -try to avoid fast food and pre-packaged foods, processed meat, ultra processed foods/beverages (donuts, candy, etc.) -most experts advise limiting sodium to < 2300mg  per day, should limit further is any chronic conditions such as high blood pressure, heart disease, diabetes, etc. The American Heart Association advised that < 1500mg  is is ideal -try to avoid foods/beverages that contain any ingredients with names you do not recognize  -try to avoid foods/beverages  with added sugar or sweeteners/sweets  -try to avoid sweet drinks (including diet drinks): soda, juice, Gatorade, sweet tea, power drinks, diet drinks -try to avoid white rice, white bread, pasta (unless whole grain)  EXERCISE GUIDELINES FOR ADULTS: -if you wish to increase your physical activity, do so gradually and with the approval of your doctor -STOP and seek medical care immediately if you have any chest pain, chest discomfort or trouble breathing when starting or increasing exercise  -move and stretch your body, legs, feet and arms when sitting for long periods -Physical activity guidelines for optimal health in adults: -get at least 150 minutes per week of moderate exercise (can talk, but not sing); this is about 20-30 minutes of sustained activity 5-7  days per week or two 10-15 minute episodes of sustained activity 5-7 days per week -do some muscle building/resistance  training/strength training at least 2 days per week  -balance exercises 3+ days per week:   Stand somewhere where you have something sturdy to hold onto if you lose balance    1) lift up on toes, then back down, start with 5x per day and work up to 20x   2) stand and lift one leg straight out to the side so that foot is a few inches of the floor, start with 5x each side and work up to 20x each side   3) stand on one foot, start with 5 seconds each side and work up to 20 seconds on each side  If you need ideas or help with getting more active:  -Silver  sneakers https://tools.silversneakers.com  -Walk with a Doc: Http://www.duncan-williams.com/  -try to include resistance (weight lifting/strength building) and balance exercises twice per week: or the following link for ideas: http://castillo-powell.com/  buyducts.dk  STRESS MANAGEMENT: -can try meditating, or just sitting quietly with deep breathing while intentionally relaxing all parts of your body for 5 minutes daily -if you need further help with stress, anxiety or depression please follow up with your primary doctor or contact the wonderful folks at [facility]: [phone]  SOCIAL CONNECTIONS: -options in [place] if you wish to engage in more social and exercise related activities:  -Silver  sneakers https://tools.silversneakers.com  -Walk with a Doc: Http://www.duncan-williams.com/  -Check out the [facility] Active Adults 50+ section on the [place] [employer] companies (hiking clubs, book clubs, cards and games, chess, exercise classes, aquatic classes and much more) - see the website for details: https://www.[place].gov/departments/parks-recreation/active-adults50  -YouTube has lots of exercise videos for different ages and abilities as well  -[facility] (a variety of indoor and outdoor inperson activities for  adults). 4795789132. [address]  -Virtual Online Classes (a variety of topics): see seniorplanet.org or call [phone]  -consider volunteering at a school, hospice center, church, senior center or elsewhere

## 2024-12-07 NOTE — Progress Notes (Signed)
 " ----------------------------------------------------------------------------------------------------------------------------------------------------------------------------------------------------------------------  Because this visit was a virtual/telehealth visit, some criteria may be missing or patient reported. Any vitals not documented were not able to be obtained and vitals that have been documented are patient reported.    MEDICARE ANNUAL PREVENTIVE VISIT WITH PROVIDER: (Welcome to Medicare, initial annual wellness or annual wellness exam)  Virtual Visit via Phone Note  I connected with Bonnie Chavez on 12/07/24 by phone  and verified that I am speaking with the correct person using two identifiers.  Location patient: home Location provider:work or home office Persons participating in the virtual visit: patient, provider  Concerns and/or follow up today: detailed intake and risks/health assessment completed in flowsheets and below - please see for details.   How often do you have a drink containing alcohol?n How many drinks containing alcohol do you have on a typical day when you are drinking?na How often do you have six or more drinks on one occasion?na Have you ever smoked?n Quit date if applicable? na  How many packs a day do/did you smoke? na Do you use smokeless tobacco?n Do you use an illicit drugs?n Do you feel safe at home?y Last dentist visit?goes usually several times per year Last eye Exam and location? Sees eye doctor in March   See HM section in Epic for other details of completed HM.    ROS: negative for report of fevers, unintentional weight loss, vision changes, vision loss, hearing loss or change, chest pain, sob, hemoptysis, melena, hematochezia, hematuria or bleeding or bruising  Patient-completed extensive health risk assessment - reviewed and discussed with the patient: See Health Risk Assessment completed with patient prior to the visit  either above or in recent phone note. This was reviewed in detailed with the patient today and appropriate recommendations, orders and referrals were placed as needed per Summary below and patient instructions.   Review of Medical History: -PMH, PSH, Family History and current specialty and care providers reviewed and updated and listed below   Patient Care Team: Mercer Clotilda SAUNDERS, MD as PCP - General (Family Medicine) Burnard Debby LABOR, MD (Inactive) as PCP - Cardiology (Cardiology) Wonda Sharper, MD as PCP - Structural Heart (Cardiology) Tommas Pears, MD as Consulting Physician (Endocrinology) Deterding, Lynwood, MD as Consulting Physician (Nephrology) Raeanne, Shanda SQUIBB, MD (Inactive) as Consulting Physician (Obstetrics and Gynecology) Robinson Idol, MD as Consulting Physician (Ophthalmology) Melodi Lerner, MD as Consulting Physician (Orthopedic Surgery) Dolan Mateo Larger, MD as Consulting Physician (Nephrology) Henry Slough, MD as Consulting Physician (Obstetrics and Gynecology)   Past Medical History:  Diagnosis Date   (HFpEF) heart failure with preserved ejection fraction (HCC)    Anemia    History of , resolved   Carpal tunnel syndrome    Chronic low back pain 02/27/2016   -DDD -seeing [facility]    CKD stage 3b, GFR 30-44 ml/min (HCC)    Diabetes mellitus    type 2, sees Dr. Tommas Saliva    Dr. Tommas   History of blood transfusion    auto transfusion post knee replacement   Hyperlipidemia    Hyperparathyroidism 02/27/2016   -sees endocrinologist, Dr. Tommas, has seen surgeon as well    Hypertension    Migraine    no longer has migraines (80's) after menopause   Mitral valve prolapse    Neuropathy    Osteoarthritis    DDD, sees [facility]   PMB (postmenopausal bleeding) 02/28/2009   Resolved   S/P TAVR (transcatheter aortic valve replacement) 10/05/2023  s/p TAVR with a 23 mm Edwards S3UR via the TF approach by Dr. Wonda and Lucas   Severe  aortic stenosis    Vaginal atrophy    Vitamin D  deficiency     Past Surgical History:  Procedure Laterality Date   CARPAL TUNNEL RELEASE Right 04/30/2023   Procedure: RIGHT CARPAL TUNNEL RELEASE;  Surgeon: Murrell Drivers, MD;  Location: [facility] OR;  Service: Orthopedics;  Laterality: Right;  30 MIN   CESAREAN SECTION  1972 &1976   CHOLECYSTECTOMY  1991   COLONOSCOPY     CORONARY ANGIOGRAPHY N/A 08/16/2023   Procedure: CORONARY ANGIOGRAPHY;  Surgeon: Wonda Sharper, MD;  Location[facility];  Service: Cardiovascular;  Laterality: N/A;   DILATATION & CURRETTAGE/HYSTEROSCOPY WITH RESECTOCOPE N/A 05/15/2013   Procedure: DILATATION & CURETTAGE/HYSTEROSCOPY WITH RESECTOCOPE;  Surgeon: Shanda SHAUNNA Muscat, MD;  Location: [facility] ORS;  Service: Gynecology;  Laterality: N/A;   DILATATION & CURRETTAGE/HYSTEROSCOPY WITH RESECTOCOPE N/A 06/03/2017   Procedure: DILATATION & CURETTAGE/HYSTEROSCOPY;  Surgeon: Muscat Shanda SHAUNNA, MD;  Location: [facility] ORS;  Service: Gynecology;  Laterality: N/A;   DILATION AND CURETTAGE OF UTERUS  1975   HEMORRHOID SURGERY  1997   KNEE SURGERY  2000 2011   right knee replacement    REPLACEMENT TOTAL KNEE  2000   left   SHOULDER SURGERY  2004 2010    TRANSTHORACIC ECHOCARDIOGRAM  07/2010   EF=>55%; LA mildly dilated; trace MR/TR;   TUBAL LIGATION  1976    Social History   Socioeconomic History   Marital status: Divorced    Spouse name: Not on file   Number of children: 2   Years of education: Not on file   Highest education level: Not on file  Occupational History   Not on file  Tobacco Use   Smoking status: Never   Smokeless tobacco: Never  Vaping Use   Vaping status: Never Used  Substance and Sexual Activity   Alcohol use: No    Alcohol/week: 0.0 standard drinks of alcohol   Drug use: No   Sexual activity: Not on file  Other Topics Concern   Not on file  Social History Narrative   Work or School: none      Home Situation: lives alone, feels safe, son lives  nearby      Spiritual Beliefs: Christian      Lifestyle: Y 3x per week, healthy diet       01/05/2019: Lives alone in one level house.   Goes to [employer] to do water aerobics 3X/week   Has son and daughter, son local      Social Drivers of Health   Tobacco Use: Low Risk (11/29/2024)   Patient History    Smoking Tobacco Use: Never    Smokeless Tobacco Use: Never    Passive Exposure: Not on file  Financial Resource Strain: Low Risk (11/06/2022)   Overall Financial Resource Strain (CARDIA)    Difficulty of Paying Living Expenses: Not hard at all  Food Insecurity: No Food Insecurity (08/11/2024)   Epic    Worried About Programme Researcher, Broadcasting/film/video in the Last Year: Never true    Ran Out of Food in the Last Year: Never true  Transportation Needs: No Transportation Needs (08/11/2024)   Epic    Lack of Transportation (Medical): No    Lack of Transportation (Non-Medical): No  Physical Activity: Sufficiently Active (12/07/2024)   Exercise Vital Sign    Days of Exercise per Week: 7 days    Minutes of  Exercise per Session: 30 min  Stress: No Stress Concern Present (12/07/2024)   [employer] of Occupational Health - Occupational Stress Questionnaire    Feeling of Stress: Not at all  Social Connections: Moderately Isolated (12/07/2024)   Social Connection and Isolation Panel    Frequency of Communication with Friends and Family: More than three times a week    Frequency of Social Gatherings with Friends and Family: More than three times a week    Attends Religious Services: 1 to 4 times per year    Active Member of Clubs or Organizations: No    Attends Banker Meetings: Never    Marital Status: Divorced  Catering Manager Violence: Not At Risk (08/11/2024)   Epic    Fear of Current or Ex-Partner: No    Emotionally Abused: No    Physically Abused: No    Sexually Abused: No  Depression (PHQ2-9): Low Risk (12/07/2024)   Depression (PHQ2-9)    PHQ-2 Score: 0  Recent Concern: Depression  (PHQ2-9) - Medium Risk (11/29/2024)   Depression (PHQ2-9)    PHQ-2 Score: 5  Alcohol Screen: Low Risk (11/06/2022)   Alcohol Screen    Last Alcohol Screening Score (AUDIT): 0  Housing: High Risk (08/11/2024)   Epic    Unable to Pay for Housing in the Last Year: Yes    Number of Times Moved in the Last Year: 0    Homeless in the Last Year: No  Utilities: Not At Risk (08/11/2024)   Epic    Threatened with loss of utilities: No  Health Literacy: Not on file    Family History  Problem Relation Age of Onset   Hypertension Mother    Diabetes Mother    Hyperlipidemia Mother    Heart attack Mother    Heart disease Father    Heart attack Father    Stroke Brother    Heart disease Brother    Diabetes Brother    Uterine cancer Maternal Grandmother     Medications Ordered Prior to Encounter[1]  Allergies[2]     Physical Exam Vitals requested from patient and listed below if patient had equipment and was able to obtain at home for this virtual visit: There were no vitals filed for this visit. Estimated body mass index is 28.91 kg/m as calculated from the following:   Height as of 11/29/24: 5' 3 (1.6 m).   Weight as of 11/29/24: 163 lb 3.2 oz (74 kg).  EKG (optional): deferred due to virtual visit  GENERAL: alert, oriented, no acute distress detected, full vision exam deferred due to pandemic and/or virtual encounter   PSYCH/NEURO: pleasant and cooperative, no obvious depression or anxiety, speech and thought processing grossly intact, Cognitive function grossly intact  Flowsheet Row Office Visit from 11/29/2024 [facility]  PHQ-9 Total Score 5        12/07/2024    1:17 PM 11/29/2024   10:11 AM 08/18/2024    1:13 PM 04/03/2024    1:48 PM 02/21/2024    8:36 AM  Depression screen PHQ 2/9  Decreased Interest 0 0 0 0 0  Down, Depressed, Hopeless 0 0 0 0 0  PHQ - 2 Score 0 0 0 0 0  Altered sleeping  1 3 0 0  Tired, decreased energy  3 3 0 0   Change in appetite  0 1 0 0  Feeling bad or failure about yourself   0 0 0 0  Trouble concentrating  1  1 0 0  Moving slowly or fidgety/restless  0  0 0  Suicidal thoughts  0 0 0 0  PHQ-9 Score  5 8  0  0   Difficult doing work/chores  Somewhat difficult        Data saved with a previous flowsheet row definition       04/03/2024    1:48 PM 06/16/2024   10:29 AM 08/18/2024    1:13 PM 11/29/2024   10:10 AM 12/07/2024    1:05 PM  Fall Risk  Falls in the past year? 0 0 1 1 --  Was there an injury with Fall? 0  0  1  0 0  Fall Risk Category Calculator 0 0 3 2   Patient at Risk for Falls Due to No Fall Risks No Fall Risks   History of fall(s)  Patient at Risk for Falls Due to - Comments     she is getting PT  Fall risk Follow up Falls evaluation completed Falls evaluation completed Falls evaluation completed Falls evaluation completed Falls evaluation completed;Education provided     Data saved with a previous flowsheet row definition     SUMMARY AND PLAN:  Encounter for Medicare annual wellness exam   Discussed applicable health maintenance/preventive health measures and advised and referred or ordered per patient preferences: -she had flu shot, she knows can get other vaccines at the pharmacy Health Maintenance  Topic Date Due   COVID-19 Vaccine (9 - 2025-26 season) 12/15/2024 (Originally 07/24/2024)   DTaP/Tdap/Td (1 - Tdap) 01/19/2025 (Originally 03/25/1953)   HEMOGLOBIN A1C  12/19/2024   OPHTHALMOLOGY EXAM  02/07/2025   FOOT EXAM  07/17/2025   Medicare Annual Wellness (AWV)  12/07/2025   Pneumococcal Vaccine: 50+ Years  Completed   Influenza Vaccine  Completed   Bone Density Scan  Completed   Zoster Vaccines- Shingrix  Completed   Meningococcal B Vaccine  Aged Out      Education and counseling on the following was provided based on the above review of health and a plan/checklist for the patient, along with additional information discussed, was provided for the patient in  the patient instructions :   -Provided counseling and plan for increased risk of falling if applicable per above screening. She is doing PT and is working on exercises.  -Advised and counseled on a healthy lifestyle - including the importance of a healthy diet, regular physical activity, social connections and stress management. -Reviewed patient's current diet. Advised and counseled on a whole foods based healthy diet. A summary of a healthy diet was provided in the Patient Instructions.  -reviewed patient's current physical activity level and discussed exercise guidelines for adults. Congratulated on her daily exercise and encouraged to continue.  -Advise yearly dental visits at minimum and regular eye exams  Follow up: see patient instructions     There are no Patient Instructions on file for this visit.  Bonnie JONELLE Cramp, DO      [1]  Current Outpatient Medications on File Prior to Visit  Medication Sig Dispense Refill   acetaminophen  (TYLENOL ) 500 MG tablet Take 500 mg by mouth every 6 (six) hours.     amLODipine  (NORVASC ) 5 MG tablet Take 1 tablet (5 mg total) by mouth daily. 90 tablet 3   aspirin  81 MG EC tablet Take 81 mg by mouth at bedtime. Swallow whole.     ciclopirox  (PENLAC ) 8 % solution Apply topically at bedtime. Apply over nail and surrounding skin. Apply daily over previous  coat. After seven (7) days, may remove with alcohol and continue cycle. 6.6 mL 2   cinacalcet  (SENSIPAR ) 30 MG tablet Take 30 mg by mouth See admin instructions. Take 30 mg by mouth in the morning on Mon/Tues/Wed/Thurs/Fri only     Continuous Glucose Receiver (FREESTYLE LIBRE 3 READER) DEVI 1 each by Other route.     Continuous Glucose Sensor (FREESTYLE LIBRE 3 PLUS SENSOR) MISC every 15 days; Duration: 30 days     feeding supplement, GLUCERNA SHAKE, (GLUCERNA SHAKE) LIQD Take 237 mLs by mouth 3 (three) times daily between meals.     furosemide  (LASIX ) 20 MG tablet Take 40 mg by mouth every morning. 30  tablet    HUMULIN 70/30 KWIKPEN (70-30) 100 UNIT/ML KwikPen Inject 6-8 Units into the skin See admin instructions. Inject 6 units into the skin the morning and 8 units in the evening     levothyroxine  (SYNTHROID ) 50 MCG tablet Take 50 mcg by mouth daily before breakfast.     nebivolol  (BYSTOLIC ) 10 MG tablet Take 1 tablet (10 mg total) by mouth in the morning. 90 tablet 3   nebivolol  (BYSTOLIC ) 5 MG tablet Take 1 tablet (5 mg total) by mouth daily. (Patient taking differently: Take 5 mg by mouth at bedtime.) 90 tablet 3   rosuvastatin  (CRESTOR ) 40 MG tablet Take 40 mg by mouth every evening.     Trolamine Salicylate (BLUE-EMU HEMP EX) Apply 1 application  topically daily as needed (pain- affected sites). (Patient not taking: Reported on 11/29/2024)     No current facility-administered medications on file prior to visit.  [2]  Allergies Allergen Reactions   Lisinopril  Hives, Other (See Comments) and Cough    And lip edema    Ibuprofen Other (See Comments)    Reaction not recalled by the patient    Nsaids Other (See Comments)    Abnormal renal function   Latex Rash   Penicillins Rash and Other (See Comments)    Pt has taken Keflex without difficulty Has patient had a PCN reaction causing immediate rash, facial/tongue/throat swelling, SOB or lightheadedness with hypotension: yes     "

## 2024-12-19 ENCOUNTER — Telehealth: Payer: Self-pay | Admitting: *Deleted

## 2024-12-19 NOTE — Telephone Encounter (Unsigned)
 Copied from CRM #[id number]. Topic: Clinical - Home Health Verbal Orders >> Dec 18, 2024  4:13 PM Drema MATSU wrote: Caller/Agency: [facility]  Callback Number: [phone] Service Requested: Occupational Therapy Frequency: 1 week 1 starting this week Any new concerns about the patient? No

## 2024-12-20 NOTE — Telephone Encounter (Signed)
 Called and left VM for Deshaira from [facility], giving VO for O.T. per Dr. Mercer

## 2025-01-16 ENCOUNTER — Ambulatory Visit: Admitting: Podiatry

## 2025-01-29 ENCOUNTER — Ambulatory Visit: Admitting: Cardiovascular Disease

## 2025-02-16 ENCOUNTER — Ambulatory Visit: Admitting: Cardiovascular Disease

## 2025-03-29 ENCOUNTER — Ambulatory Visit: Admitting: Family Medicine
